# Patient Record
Sex: Female | Born: 1935 | Race: Black or African American | Hispanic: No | Smoking: Former smoker
Health system: Southern US, Community
[De-identification: ages and names within clinical notes are randomized; demographics above are authoritative.]

## PROBLEM LIST (undated history)

## (undated) DIAGNOSIS — E0590 Thyrotoxicosis, unspecified without thyrotoxic crisis or storm: Secondary | ICD-10-CM

## (undated) DIAGNOSIS — F419 Anxiety disorder, unspecified: Secondary | ICD-10-CM

## (undated) DIAGNOSIS — E785 Hyperlipidemia, unspecified: Secondary | ICD-10-CM

## (undated) DIAGNOSIS — M8580 Other specified disorders of bone density and structure, unspecified site: Secondary | ICD-10-CM

## (undated) DIAGNOSIS — IMO0001 Reserved for inherently not codable concepts without codable children: Secondary | ICD-10-CM

## (undated) DIAGNOSIS — R413 Other amnesia: Secondary | ICD-10-CM

## (undated) DIAGNOSIS — I639 Cerebral infarction, unspecified: Secondary | ICD-10-CM

## (undated) DIAGNOSIS — I10 Essential (primary) hypertension: Secondary | ICD-10-CM

## (undated) DIAGNOSIS — I480 Paroxysmal atrial fibrillation: Secondary | ICD-10-CM

## (undated) DIAGNOSIS — L910 Hypertrophic scar: Secondary | ICD-10-CM

## (undated) DIAGNOSIS — G459 Transient cerebral ischemic attack, unspecified: Secondary | ICD-10-CM

## (undated) DIAGNOSIS — M1990 Unspecified osteoarthritis, unspecified site: Secondary | ICD-10-CM

## (undated) DIAGNOSIS — R42 Dizziness and giddiness: Secondary | ICD-10-CM

## (undated) DIAGNOSIS — K219 Gastro-esophageal reflux disease without esophagitis: Secondary | ICD-10-CM

## (undated) DIAGNOSIS — L853 Xerosis cutis: Secondary | ICD-10-CM

## (undated) DIAGNOSIS — N390 Urinary tract infection, site not specified: Secondary | ICD-10-CM

## (undated) DIAGNOSIS — I2510 Atherosclerotic heart disease of native coronary artery without angina pectoris: Secondary | ICD-10-CM

## (undated) HISTORY — DX: Other specified disorders of bone density and structure, unspecified site: M85.80

## (undated) HISTORY — DX: Other amnesia: R41.3

## (undated) HISTORY — DX: Essential (primary) hypertension: I10

## (undated) HISTORY — DX: Urinary tract infection, site not specified: N39.0

## (undated) HISTORY — DX: Dizziness and giddiness: R42

## (undated) HISTORY — DX: Xerosis cutis: L85.3

## (undated) HISTORY — DX: Hypertrophic scar: L91.0

## (undated) HISTORY — DX: Paroxysmal atrial fibrillation: I48.0

## (undated) HISTORY — DX: Thyrotoxicosis, unspecified without thyrotoxic crisis or storm: E05.90

## (undated) HISTORY — PX: HEMORRHOID SURGERY: SHX153

## (undated) HISTORY — PX: JOINT REPLACEMENT: SHX530

## (undated) HISTORY — DX: Anxiety disorder, unspecified: F41.9

## (undated) HISTORY — DX: Hyperlipidemia, unspecified: E78.5

## (undated) HISTORY — DX: Cerebral infarction, unspecified: I63.9

## (undated) HISTORY — DX: Unspecified osteoarthritis, unspecified site: M19.90

## (undated) HISTORY — PX: OOPHORECTOMY: SHX86

## (undated) HISTORY — DX: Atherosclerotic heart disease of native coronary artery without angina pectoris: I25.10

## (undated) HISTORY — PX: CARDIAC VALVE REPLACEMENT: SHX585

## (undated) HISTORY — PX: ABDOMINAL HYSTERECTOMY: SHX81

---

## 1996-06-10 HISTORY — PX: CORONARY ARTERY BYPASS GRAFT: SHX141

## 1996-06-10 HISTORY — PX: AORTIC VALVE REPLACEMENT: SHX41

## 1997-06-10 HISTORY — PX: TOTAL KNEE ARTHROPLASTY: SHX125

## 1998-06-05 ENCOUNTER — Encounter: Payer: Self-pay | Admitting: Specialist

## 1998-06-07 ENCOUNTER — Inpatient Hospital Stay (HOSPITAL_COMMUNITY): Admission: RE | Admit: 1998-06-07 | Discharge: 1998-06-09 | Payer: Self-pay | Admitting: Specialist

## 1998-06-09 ENCOUNTER — Inpatient Hospital Stay (HOSPITAL_COMMUNITY)
Admission: RE | Admit: 1998-06-09 | Discharge: 1998-06-16 | Payer: Self-pay | Admitting: Physical Medicine and Rehabilitation

## 1999-05-02 ENCOUNTER — Ambulatory Visit (HOSPITAL_COMMUNITY): Admission: RE | Admit: 1999-05-02 | Discharge: 1999-05-02 | Payer: Self-pay | Admitting: Gastroenterology

## 1999-07-30 ENCOUNTER — Encounter: Admission: RE | Admit: 1999-07-30 | Discharge: 1999-07-30 | Payer: Self-pay | Admitting: Family Medicine

## 1999-07-30 ENCOUNTER — Encounter: Payer: Self-pay | Admitting: Family Medicine

## 2000-08-06 ENCOUNTER — Encounter: Admission: RE | Admit: 2000-08-06 | Discharge: 2000-08-06 | Payer: Self-pay | Admitting: Family Medicine

## 2000-08-06 ENCOUNTER — Encounter: Payer: Self-pay | Admitting: Family Medicine

## 2004-02-14 ENCOUNTER — Encounter: Admission: RE | Admit: 2004-02-14 | Discharge: 2004-02-14 | Payer: Self-pay | Admitting: Family Medicine

## 2004-02-28 ENCOUNTER — Encounter: Admission: RE | Admit: 2004-02-28 | Discharge: 2004-02-28 | Payer: Self-pay | Admitting: Family Medicine

## 2004-03-22 ENCOUNTER — Encounter: Admission: RE | Admit: 2004-03-22 | Discharge: 2004-03-22 | Payer: Self-pay | Admitting: Family Medicine

## 2004-04-17 ENCOUNTER — Encounter: Admission: RE | Admit: 2004-04-17 | Discharge: 2004-04-17 | Payer: Self-pay | Admitting: Family Medicine

## 2004-11-10 ENCOUNTER — Emergency Department (HOSPITAL_COMMUNITY): Admission: EM | Admit: 2004-11-10 | Discharge: 2004-11-10 | Payer: Self-pay | Admitting: Emergency Medicine

## 2004-11-13 ENCOUNTER — Encounter: Admission: RE | Admit: 2004-11-13 | Discharge: 2004-11-13 | Payer: Self-pay | Admitting: Family Medicine

## 2005-06-10 LAB — HM COLONOSCOPY

## 2005-10-03 ENCOUNTER — Encounter: Admission: RE | Admit: 2005-10-03 | Discharge: 2005-10-03 | Payer: Self-pay | Admitting: Family Medicine

## 2005-10-05 ENCOUNTER — Encounter: Admission: RE | Admit: 2005-10-05 | Discharge: 2005-10-05 | Payer: Self-pay | Admitting: Family Medicine

## 2006-07-25 ENCOUNTER — Ambulatory Visit: Payer: Self-pay | Admitting: Internal Medicine

## 2006-08-15 ENCOUNTER — Ambulatory Visit: Payer: Self-pay | Admitting: Family Medicine

## 2006-08-19 ENCOUNTER — Ambulatory Visit: Payer: Self-pay | Admitting: Family Medicine

## 2006-08-19 LAB — CONVERTED CEMR LAB
BUN: 19 mg/dL (ref 6–23)
Basophils Absolute: 0 10*3/uL (ref 0.0–0.1)
Basophils Relative: 1 % (ref 0.0–1.0)
CO2: 30 meq/L (ref 19–32)
Calcium: 9.1 mg/dL (ref 8.4–10.5)
Chloride: 102 meq/L (ref 96–112)
Creatinine, Ser: 1.4 mg/dL — ABNORMAL HIGH (ref 0.4–1.2)
Free T4: 0.5 ng/dL — ABNORMAL LOW (ref 0.6–1.6)
HCT: 37.1 % (ref 36.0–46.0)
Lymphocytes Relative: 21.4 % (ref 12.0–46.0)
MCV: 96.1 fL (ref 78.0–100.0)
Monocytes Relative: 18.3 % — ABNORMAL HIGH (ref 3.0–11.0)
Platelets: 204 10*3/uL (ref 150–400)
RBC: 3.86 M/uL — ABNORMAL LOW (ref 3.87–5.11)
RDW: 12.1 % (ref 11.5–14.6)
T3, Free: 3 pg/mL (ref 2.3–4.2)
TSH: 0.58 microintl units/mL (ref 0.35–5.50)
WBC: 4.6 10*3/uL (ref 4.5–10.5)

## 2006-08-20 ENCOUNTER — Ambulatory Visit: Payer: Self-pay | Admitting: Internal Medicine

## 2006-09-03 ENCOUNTER — Encounter: Payer: Self-pay | Admitting: Internal Medicine

## 2006-09-03 ENCOUNTER — Ambulatory Visit: Payer: Self-pay | Admitting: Internal Medicine

## 2006-09-03 DIAGNOSIS — E0590 Thyrotoxicosis, unspecified without thyrotoxic crisis or storm: Secondary | ICD-10-CM

## 2006-09-03 DIAGNOSIS — I10 Essential (primary) hypertension: Secondary | ICD-10-CM

## 2006-09-03 DIAGNOSIS — Z951 Presence of aortocoronary bypass graft: Secondary | ICD-10-CM | POA: Insufficient documentation

## 2006-09-03 DIAGNOSIS — J45909 Unspecified asthma, uncomplicated: Secondary | ICD-10-CM

## 2006-09-03 DIAGNOSIS — E785 Hyperlipidemia, unspecified: Secondary | ICD-10-CM

## 2006-09-03 DIAGNOSIS — I25810 Atherosclerosis of coronary artery bypass graft(s) without angina pectoris: Secondary | ICD-10-CM

## 2006-10-15 ENCOUNTER — Encounter: Payer: Self-pay | Admitting: Internal Medicine

## 2006-10-15 ENCOUNTER — Ambulatory Visit: Payer: Self-pay | Admitting: Internal Medicine

## 2006-10-15 DIAGNOSIS — Z8719 Personal history of other diseases of the digestive system: Secondary | ICD-10-CM

## 2006-10-15 DIAGNOSIS — Z952 Presence of prosthetic heart valve: Secondary | ICD-10-CM

## 2006-10-15 LAB — CONVERTED CEMR LAB
BUN: 33 mg/dL — ABNORMAL HIGH (ref 6–23)
CO2: 30 meq/L (ref 19–32)
Calcium: 9.3 mg/dL (ref 8.4–10.5)
GFR calc Af Amer: 40 mL/min
GFR calc non Af Amer: 33 mL/min
Potassium: 4.1 meq/L (ref 3.5–5.1)

## 2006-10-29 ENCOUNTER — Encounter: Payer: Self-pay | Admitting: Internal Medicine

## 2006-11-05 ENCOUNTER — Encounter: Payer: Self-pay | Admitting: Internal Medicine

## 2006-11-14 ENCOUNTER — Encounter (INDEPENDENT_AMBULATORY_CARE_PROVIDER_SITE_OTHER): Payer: Self-pay | Admitting: *Deleted

## 2006-11-26 ENCOUNTER — Ambulatory Visit: Payer: Self-pay | Admitting: Internal Medicine

## 2006-11-26 DIAGNOSIS — E1159 Type 2 diabetes mellitus with other circulatory complications: Secondary | ICD-10-CM

## 2006-11-28 ENCOUNTER — Ambulatory Visit: Payer: Self-pay | Admitting: Internal Medicine

## 2006-12-03 LAB — CONVERTED CEMR LAB
Calcium: 9.5 mg/dL (ref 8.4–10.5)
Chloride: 98 meq/L (ref 96–112)
Creatinine,U: 181.7 mg/dL
GFR calc Af Amer: 61 mL/min
GFR calc non Af Amer: 51 mL/min
Glucose, Bld: 88 mg/dL (ref 70–99)
Hgb A1c MFr Bld: 6.5 % — ABNORMAL HIGH (ref 4.6–6.0)
Sodium: 132 meq/L — ABNORMAL LOW (ref 135–145)
TSH: 1.6 microintl units/mL (ref 0.35–5.50)

## 2006-12-05 ENCOUNTER — Encounter: Payer: Self-pay | Admitting: Internal Medicine

## 2007-01-01 ENCOUNTER — Encounter: Payer: Self-pay | Admitting: Internal Medicine

## 2007-01-02 ENCOUNTER — Encounter: Payer: Self-pay | Admitting: Internal Medicine

## 2007-02-26 ENCOUNTER — Ambulatory Visit: Payer: Self-pay | Admitting: Internal Medicine

## 2007-02-27 ENCOUNTER — Telehealth: Payer: Self-pay | Admitting: Internal Medicine

## 2007-03-04 ENCOUNTER — Ambulatory Visit: Payer: Self-pay | Admitting: Internal Medicine

## 2007-03-05 ENCOUNTER — Encounter: Payer: Self-pay | Admitting: Internal Medicine

## 2007-03-05 DIAGNOSIS — I4891 Unspecified atrial fibrillation: Secondary | ICD-10-CM | POA: Insufficient documentation

## 2007-03-05 LAB — CONVERTED CEMR LAB
Cholesterol: 122 mg/dL (ref 0–200)
LDL Cholesterol: 66 mg/dL (ref 0–99)
Total CHOL/HDL Ratio: 2.7
Triglycerides: 51 mg/dL (ref 0–149)

## 2007-03-09 ENCOUNTER — Encounter: Payer: Self-pay | Admitting: Internal Medicine

## 2007-04-06 ENCOUNTER — Telehealth (INDEPENDENT_AMBULATORY_CARE_PROVIDER_SITE_OTHER): Payer: Self-pay | Admitting: *Deleted

## 2007-04-14 ENCOUNTER — Ambulatory Visit: Payer: Self-pay | Admitting: Internal Medicine

## 2007-04-29 ENCOUNTER — Ambulatory Visit: Payer: Self-pay | Admitting: Cardiology

## 2007-04-29 ENCOUNTER — Ambulatory Visit: Payer: Self-pay | Admitting: Internal Medicine

## 2007-04-29 LAB — CONVERTED CEMR LAB
Hemoglobin: 14.7 g/dL
INR: 2.3

## 2007-04-30 ENCOUNTER — Ambulatory Visit: Payer: Self-pay | Admitting: Internal Medicine

## 2007-04-30 LAB — CONVERTED CEMR LAB

## 2007-05-01 ENCOUNTER — Encounter: Payer: Self-pay | Admitting: Internal Medicine

## 2007-05-04 ENCOUNTER — Telehealth: Payer: Self-pay | Admitting: Internal Medicine

## 2007-06-16 ENCOUNTER — Ambulatory Visit: Payer: Self-pay | Admitting: Internal Medicine

## 2007-06-16 LAB — CONVERTED CEMR LAB
Nitrite: NEGATIVE
Specific Gravity, Urine: 1.01
Urobilinogen, UA: NEGATIVE
WBC Urine, dipstick: NEGATIVE

## 2007-06-17 ENCOUNTER — Encounter: Payer: Self-pay | Admitting: Internal Medicine

## 2007-06-18 ENCOUNTER — Telehealth (INDEPENDENT_AMBULATORY_CARE_PROVIDER_SITE_OTHER): Payer: Self-pay | Admitting: *Deleted

## 2007-06-22 LAB — CONVERTED CEMR LAB
ALT: 14 units/L (ref 0–35)
AST: 22 units/L (ref 0–37)
BUN: 25 mg/dL — ABNORMAL HIGH (ref 6–23)
CO2: 29 meq/L (ref 19–32)
Calcium: 9.1 mg/dL (ref 8.4–10.5)
Chloride: 104 meq/L (ref 96–112)
Creatinine, Ser: 1 mg/dL (ref 0.4–1.2)
GFR calc Af Amer: 68 mL/min
Total CHOL/HDL Ratio: 3.3

## 2007-07-06 ENCOUNTER — Telehealth (INDEPENDENT_AMBULATORY_CARE_PROVIDER_SITE_OTHER): Payer: Self-pay | Admitting: *Deleted

## 2007-07-09 ENCOUNTER — Telehealth (INDEPENDENT_AMBULATORY_CARE_PROVIDER_SITE_OTHER): Payer: Self-pay | Admitting: *Deleted

## 2007-07-13 ENCOUNTER — Telehealth (INDEPENDENT_AMBULATORY_CARE_PROVIDER_SITE_OTHER): Payer: Self-pay | Admitting: *Deleted

## 2007-07-13 ENCOUNTER — Ambulatory Visit: Payer: Self-pay | Admitting: Internal Medicine

## 2007-07-13 LAB — CONVERTED CEMR LAB
Ketones, urine, test strip: NEGATIVE
Nitrite: NEGATIVE
Specific Gravity, Urine: <1.005

## 2007-07-14 ENCOUNTER — Encounter: Payer: Self-pay | Admitting: Internal Medicine

## 2007-07-14 LAB — CONVERTED CEMR LAB
Bilirubin Urine: NEGATIVE
Ketones, ur: NEGATIVE mg/dL
Protein, ur: NEGATIVE mg/dL
Urobilinogen, UA: 0.2 (ref 0.0–1.0)

## 2007-07-16 ENCOUNTER — Telehealth (INDEPENDENT_AMBULATORY_CARE_PROVIDER_SITE_OTHER): Payer: Self-pay | Admitting: *Deleted

## 2007-07-20 ENCOUNTER — Ambulatory Visit: Payer: Self-pay | Admitting: Internal Medicine

## 2007-07-20 DIAGNOSIS — N390 Urinary tract infection, site not specified: Secondary | ICD-10-CM | POA: Insufficient documentation

## 2007-07-21 ENCOUNTER — Ambulatory Visit: Payer: Self-pay | Admitting: Cardiovascular Disease

## 2007-07-22 ENCOUNTER — Encounter: Payer: Self-pay | Admitting: Internal Medicine

## 2007-09-15 ENCOUNTER — Ambulatory Visit: Payer: Self-pay | Admitting: Internal Medicine

## 2007-09-24 LAB — CONVERTED CEMR LAB
Basophils Relative: 0.5 % (ref 0.0–1.0)
Eosinophils Absolute: 0.1 10*3/uL (ref 0.0–0.7)
Eosinophils Relative: 2.5 % (ref 0.0–5.0)
HCT: 38.6 % (ref 36.0–46.0)
Hemoglobin: 12.6 g/dL (ref 12.0–15.0)
MCV: 97.3 fL (ref 78.0–100.0)
Monocytes Absolute: 0.5 10*3/uL (ref 0.1–1.0)
RBC: 3.97 M/uL (ref 3.87–5.11)
WBC: 4.5 10*3/uL (ref 4.5–10.5)

## 2007-10-16 ENCOUNTER — Encounter: Payer: Self-pay | Admitting: Internal Medicine

## 2007-10-16 ENCOUNTER — Telehealth (INDEPENDENT_AMBULATORY_CARE_PROVIDER_SITE_OTHER): Payer: Self-pay | Admitting: *Deleted

## 2007-11-03 ENCOUNTER — Encounter: Payer: Self-pay | Admitting: Internal Medicine

## 2007-11-10 ENCOUNTER — Encounter: Payer: Self-pay | Admitting: Internal Medicine

## 2007-11-16 ENCOUNTER — Telehealth (INDEPENDENT_AMBULATORY_CARE_PROVIDER_SITE_OTHER): Payer: Self-pay | Admitting: *Deleted

## 2007-11-20 ENCOUNTER — Telehealth (INDEPENDENT_AMBULATORY_CARE_PROVIDER_SITE_OTHER): Payer: Self-pay | Admitting: *Deleted

## 2007-11-24 ENCOUNTER — Ambulatory Visit: Payer: Self-pay | Admitting: Internal Medicine

## 2007-12-29 ENCOUNTER — Ambulatory Visit: Payer: Self-pay | Admitting: Internal Medicine

## 2007-12-31 ENCOUNTER — Encounter: Payer: Self-pay | Admitting: Internal Medicine

## 2008-01-04 LAB — CONVERTED CEMR LAB
ALT: 15 units/L (ref 0–35)
Alkaline Phosphatase: 66 units/L (ref 39–117)
Bilirubin, Direct: 0.1 mg/dL (ref 0.0–0.3)
CO2: 29 meq/L (ref 19–32)
GFR calc Af Amer: 61 mL/min
Glucose, Bld: 95 mg/dL (ref 70–99)
Microalb Creat Ratio: 3.8 mg/g (ref 0.0–30.0)
Potassium: 3.5 meq/L (ref 3.5–5.1)
Sodium: 137 meq/L (ref 135–145)
Total Bilirubin: 0.6 mg/dL (ref 0.3–1.2)
Total Protein: 6.5 g/dL (ref 6.0–8.3)

## 2008-01-05 ENCOUNTER — Telehealth (INDEPENDENT_AMBULATORY_CARE_PROVIDER_SITE_OTHER): Payer: Self-pay | Admitting: *Deleted

## 2008-02-09 ENCOUNTER — Telehealth (INDEPENDENT_AMBULATORY_CARE_PROVIDER_SITE_OTHER): Payer: Self-pay | Admitting: *Deleted

## 2008-04-19 ENCOUNTER — Telehealth (INDEPENDENT_AMBULATORY_CARE_PROVIDER_SITE_OTHER): Payer: Self-pay | Admitting: *Deleted

## 2008-04-27 ENCOUNTER — Ambulatory Visit: Payer: Self-pay | Admitting: Internal Medicine

## 2008-04-29 ENCOUNTER — Ambulatory Visit: Payer: Self-pay | Admitting: Internal Medicine

## 2008-05-03 ENCOUNTER — Telehealth (INDEPENDENT_AMBULATORY_CARE_PROVIDER_SITE_OTHER): Payer: Self-pay | Admitting: *Deleted

## 2008-05-03 LAB — CONVERTED CEMR LAB
BUN: 25 mg/dL — ABNORMAL HIGH (ref 6–23)
CO2: 30 meq/L (ref 19–32)
Calcium: 9 mg/dL (ref 8.4–10.5)
Cholesterol: 153 mg/dL (ref 0–200)
Creatinine, Ser: 1 mg/dL (ref 0.4–1.2)
HDL: 54.4 mg/dL (ref >39.0)
Hgb A1c MFr Bld: 6.1 % — ABNORMAL HIGH (ref 4.6–6.0)
Total CHOL/HDL Ratio: 2.8
Triglycerides: 67 mg/dL (ref 0–149)

## 2008-05-18 ENCOUNTER — Encounter: Payer: Self-pay | Admitting: Internal Medicine

## 2008-05-31 ENCOUNTER — Telehealth (INDEPENDENT_AMBULATORY_CARE_PROVIDER_SITE_OTHER): Payer: Self-pay | Admitting: *Deleted

## 2008-06-06 ENCOUNTER — Telehealth (INDEPENDENT_AMBULATORY_CARE_PROVIDER_SITE_OTHER): Payer: Self-pay | Admitting: *Deleted

## 2008-06-08 ENCOUNTER — Ambulatory Visit: Payer: Self-pay | Admitting: Internal Medicine

## 2008-06-08 DIAGNOSIS — M1990 Unspecified osteoarthritis, unspecified site: Secondary | ICD-10-CM | POA: Insufficient documentation

## 2008-09-30 ENCOUNTER — Encounter (INDEPENDENT_AMBULATORY_CARE_PROVIDER_SITE_OTHER): Payer: Self-pay | Admitting: *Deleted

## 2008-10-31 ENCOUNTER — Ambulatory Visit: Payer: Self-pay | Admitting: Internal Medicine

## 2008-10-31 DIAGNOSIS — M899 Disorder of bone, unspecified: Secondary | ICD-10-CM | POA: Insufficient documentation

## 2008-10-31 DIAGNOSIS — M949 Disorder of cartilage, unspecified: Secondary | ICD-10-CM

## 2008-11-03 ENCOUNTER — Ambulatory Visit: Payer: Self-pay | Admitting: Internal Medicine

## 2008-11-10 LAB — CONVERTED CEMR LAB: Hgb A1c MFr Bld: 6.4 % (ref 4.6–6.5)

## 2008-11-13 ENCOUNTER — Ambulatory Visit: Payer: Self-pay | Admitting: Diagnostic Radiology

## 2008-11-13 ENCOUNTER — Emergency Department (HOSPITAL_BASED_OUTPATIENT_CLINIC_OR_DEPARTMENT_OTHER): Admission: EM | Admit: 2008-11-13 | Discharge: 2008-11-13 | Payer: Self-pay | Admitting: Emergency Medicine

## 2008-11-16 ENCOUNTER — Ambulatory Visit: Payer: Self-pay | Admitting: Internal Medicine

## 2008-12-09 ENCOUNTER — Encounter: Payer: Self-pay | Admitting: Internal Medicine

## 2008-12-26 ENCOUNTER — Encounter: Payer: Self-pay | Admitting: Internal Medicine

## 2009-02-16 ENCOUNTER — Telehealth: Payer: Self-pay | Admitting: Internal Medicine

## 2009-03-06 ENCOUNTER — Ambulatory Visit: Payer: Self-pay | Admitting: Internal Medicine

## 2009-03-08 ENCOUNTER — Telehealth (INDEPENDENT_AMBULATORY_CARE_PROVIDER_SITE_OTHER): Payer: Self-pay | Admitting: *Deleted

## 2009-03-08 LAB — CONVERTED CEMR LAB
ALT: 18 units/L (ref 0–35)
AST: 26 units/L (ref 0–37)
BUN: 16 mg/dL (ref 6–23)
Basophils Absolute: 0.1 10*3/uL (ref 0.0–0.1)
Basophils Relative: 0.9 % (ref 0.0–3.0)
CO2: 30 meq/L (ref 19–32)
Calcium: 9 mg/dL (ref 8.4–10.5)
Chloride: 102 meq/L (ref 96–112)
Creatinine, Ser: 0.9 mg/dL (ref 0.4–1.2)
Creatinine,U: 49.3 mg/dL
Eosinophils Absolute: 0.2 10*3/uL (ref 0.0–0.7)
Eosinophils Relative: 3 % (ref 0.0–5.0)
Free T4: 0.6 ng/dL (ref 0.6–1.6)
GFR calc non Af Amer: 76.69 mL/min (ref >60)
Glucose, Bld: 88 mg/dL (ref 70–99)
HCT: 38.6 % (ref 36.0–46.0)
Hemoglobin: 13 g/dL (ref 12.0–15.0)
Hgb A1c MFr Bld: 6.4 % (ref 4.6–6.5)
Lymphocytes Relative: 24.2 % (ref 12.0–46.0)
Lymphs Abs: 1.5 10*3/uL (ref 0.7–4.0)
MCHC: 33.8 g/dL (ref 30.0–36.0)
MCV: 97.1 fL (ref 78.0–100.0)
Microalb Creat Ratio: 2 mg/g (ref 0.0–30.0)
Microalb, Ur: 0.1 mg/dL (ref 0.0–1.9)
Monocytes Absolute: 0.7 10*3/uL (ref 0.1–1.0)
Monocytes Relative: 11.1 % (ref 3.0–12.0)
Neutro Abs: 3.6 10*3/uL (ref 1.4–7.7)
Neutrophils Relative %: 60.8 % (ref 43.0–77.0)
Platelets: 172 10*3/uL (ref 150.0–400.0)
Potassium: 3.9 meq/L (ref 3.5–5.1)
RBC: 3.98 M/uL (ref 3.87–5.11)
RDW: 13.1 % (ref 11.5–14.6)
Sodium: 138 meq/L (ref 135–145)
T3, Free: 2.3 pg/mL (ref 2.3–4.2)
TSH: 2.29 microintl units/mL (ref 0.35–5.50)
WBC: 6.1 10*3/uL (ref 4.5–10.5)

## 2009-04-19 ENCOUNTER — Ambulatory Visit: Payer: Self-pay | Admitting: Internal Medicine

## 2009-04-25 LAB — CONVERTED CEMR LAB: TSH: 0.07 microintl units/mL — ABNORMAL LOW (ref 0.35–5.50)

## 2009-05-03 ENCOUNTER — Telehealth: Payer: Self-pay | Admitting: Internal Medicine

## 2009-06-23 ENCOUNTER — Encounter: Payer: Self-pay | Admitting: Internal Medicine

## 2009-07-11 ENCOUNTER — Ambulatory Visit: Payer: Self-pay | Admitting: Internal Medicine

## 2009-07-11 LAB — CONVERTED CEMR LAB
HDL goal, serum: 40 mg/dL
Vit D, 25-Hydroxy: 28 ng/mL — ABNORMAL LOW (ref 30–89)

## 2009-07-18 ENCOUNTER — Telehealth (INDEPENDENT_AMBULATORY_CARE_PROVIDER_SITE_OTHER): Payer: Self-pay | Admitting: *Deleted

## 2009-07-18 LAB — CONVERTED CEMR LAB
BUN: 15 mg/dL (ref 6–23)
CO2: 29 meq/L (ref 19–32)
Chloride: 99 meq/L (ref 96–112)
Cholesterol: 119 mg/dL (ref 0–200)
Creatinine, Ser: 1.1 mg/dL (ref 0.4–1.2)
HDL: 50.2 mg/dL (ref >39.00)
Hgb A1c MFr Bld: 6.5 % (ref 4.6–6.5)
LDL Cholesterol: 57 mg/dL (ref 0–99)
Potassium: 3.5 meq/L (ref 3.5–5.1)
VLDL: 11.6 mg/dL (ref 0.0–40.0)

## 2009-08-29 ENCOUNTER — Ambulatory Visit: Payer: Self-pay | Admitting: Internal Medicine

## 2009-08-29 DIAGNOSIS — R1013 Epigastric pain: Secondary | ICD-10-CM

## 2009-08-29 DIAGNOSIS — K3189 Other diseases of stomach and duodenum: Secondary | ICD-10-CM

## 2009-08-31 ENCOUNTER — Telehealth (INDEPENDENT_AMBULATORY_CARE_PROVIDER_SITE_OTHER): Payer: Self-pay | Admitting: *Deleted

## 2009-11-30 ENCOUNTER — Encounter: Payer: Self-pay | Admitting: Internal Medicine

## 2009-12-18 ENCOUNTER — Ambulatory Visit: Payer: Self-pay | Admitting: Internal Medicine

## 2009-12-18 LAB — CONVERTED CEMR LAB
Blood in Urine, dipstick: NEGATIVE
Glucose, Urine, Semiquant: NEGATIVE
Nitrite: NEGATIVE
Protein, U semiquant: NEGATIVE
WBC Urine, dipstick: NEGATIVE

## 2009-12-19 ENCOUNTER — Encounter: Payer: Self-pay | Admitting: Internal Medicine

## 2009-12-19 HISTORY — PX: OTHER SURGICAL HISTORY: SHX169

## 2009-12-22 ENCOUNTER — Encounter: Payer: Self-pay | Admitting: Internal Medicine

## 2010-01-11 ENCOUNTER — Encounter: Payer: Self-pay | Admitting: Internal Medicine

## 2010-01-16 ENCOUNTER — Encounter: Payer: Self-pay | Admitting: Internal Medicine

## 2010-01-19 ENCOUNTER — Telehealth: Payer: Self-pay | Admitting: Internal Medicine

## 2010-02-05 ENCOUNTER — Telehealth: Payer: Self-pay | Admitting: Internal Medicine

## 2010-02-07 ENCOUNTER — Encounter: Payer: Self-pay | Admitting: Internal Medicine

## 2010-02-09 ENCOUNTER — Encounter: Payer: Self-pay | Admitting: Internal Medicine

## 2010-04-19 ENCOUNTER — Ambulatory Visit: Payer: Self-pay | Admitting: Internal Medicine

## 2010-04-19 DIAGNOSIS — R269 Unspecified abnormalities of gait and mobility: Secondary | ICD-10-CM

## 2010-04-20 ENCOUNTER — Ambulatory Visit: Payer: Self-pay | Admitting: Internal Medicine

## 2010-04-23 ENCOUNTER — Telehealth: Payer: Self-pay | Admitting: Internal Medicine

## 2010-04-23 LAB — CONVERTED CEMR LAB
BUN: 21 mg/dL (ref 6–23)
Basophils Relative: 0.7 % (ref 0.0–3.0)
CO2: 31 meq/L (ref 19–32)
Calcium: 8.9 mg/dL (ref 8.4–10.5)
Creatinine, Ser: 1 mg/dL (ref 0.4–1.2)
Eosinophils Relative: 5.5 % — ABNORMAL HIGH (ref 0.0–5.0)
Glucose, Bld: 100 mg/dL — ABNORMAL HIGH (ref 70–99)
HCT: 39.4 % (ref 36.0–46.0)
Hemoglobin: 13.3 g/dL (ref 12.0–15.0)
Lymphs Abs: 1.4 10*3/uL (ref 0.7–4.0)
MCV: 98.2 fL (ref 78.0–100.0)
Monocytes Absolute: 0.6 10*3/uL (ref 0.1–1.0)
Monocytes Relative: 13 % — ABNORMAL HIGH (ref 3.0–12.0)
Neutro Abs: 2.6 10*3/uL (ref 1.4–7.7)
RBC: 4.01 M/uL (ref 3.87–5.11)
Sodium: 137 meq/L (ref 135–145)
TSH: 2.34 microintl units/mL (ref 0.35–5.50)
WBC: 5 10*3/uL (ref 4.5–10.5)

## 2010-05-01 ENCOUNTER — Encounter
Admission: RE | Admit: 2010-05-01 | Discharge: 2010-06-07 | Payer: Self-pay | Source: Home / Self Care | Attending: Internal Medicine | Admitting: Internal Medicine

## 2010-05-10 ENCOUNTER — Emergency Department (HOSPITAL_COMMUNITY)
Admission: EM | Admit: 2010-05-10 | Discharge: 2010-05-10 | Payer: Self-pay | Source: Home / Self Care | Admitting: Emergency Medicine

## 2010-05-10 ENCOUNTER — Telehealth: Payer: Self-pay | Admitting: Internal Medicine

## 2010-05-16 ENCOUNTER — Telehealth: Payer: Self-pay | Admitting: Internal Medicine

## 2010-05-16 ENCOUNTER — Ambulatory Visit: Payer: Self-pay | Admitting: Internal Medicine

## 2010-05-25 ENCOUNTER — Ambulatory Visit: Payer: Self-pay | Admitting: Endocrinology

## 2010-06-05 ENCOUNTER — Encounter
Admission: RE | Admit: 2010-06-05 | Discharge: 2010-06-05 | Payer: Self-pay | Source: Home / Self Care | Attending: Endocrinology | Admitting: Endocrinology

## 2010-06-08 ENCOUNTER — Encounter: Payer: Self-pay | Admitting: Internal Medicine

## 2010-06-10 ENCOUNTER — Encounter
Admission: RE | Admit: 2010-06-10 | Discharge: 2010-06-26 | Payer: Self-pay | Source: Home / Self Care | Attending: Internal Medicine | Admitting: Internal Medicine

## 2010-06-27 ENCOUNTER — Ambulatory Visit
Admission: RE | Admit: 2010-06-27 | Discharge: 2010-06-27 | Payer: Self-pay | Source: Home / Self Care | Attending: Internal Medicine | Admitting: Internal Medicine

## 2010-06-27 ENCOUNTER — Inpatient Hospital Stay (HOSPITAL_COMMUNITY)
Admission: AD | Admit: 2010-06-27 | Discharge: 2010-06-30 | Payer: Self-pay | Source: Home / Self Care | Attending: Internal Medicine | Admitting: Internal Medicine

## 2010-06-27 DIAGNOSIS — R42 Dizziness and giddiness: Secondary | ICD-10-CM | POA: Insufficient documentation

## 2010-06-28 ENCOUNTER — Encounter (INDEPENDENT_AMBULATORY_CARE_PROVIDER_SITE_OTHER): Payer: Self-pay | Admitting: Internal Medicine

## 2010-07-02 LAB — CBC
HCT: 42.6 % (ref 36.0–46.0)
HCT: 40.1 % (ref 36.0–46.0)
HCT: 33.7 % — ABNORMAL LOW (ref 36.0–46.0)
Hemoglobin: 13.3 g/dL (ref 12.0–15.0)
Hemoglobin: 11.2 g/dL — ABNORMAL LOW (ref 12.0–15.0)
MCH: 31.4 pg (ref 26.0–34.0)
MCHC: 33.3 g/dL (ref 30.0–36.0)
MCHC: 33.2 g/dL (ref 30.0–36.0)
MCV: 94.4 fL (ref 78.0–100.0)
Platelets: 209 10*3/uL (ref 150–400)
RBC: 4.5 MIL/uL (ref 3.87–5.11)
RBC: 4.29 MIL/uL (ref 3.87–5.11)
RDW: 12.6 % (ref 11.5–15.5)
WBC: 7 10*3/uL (ref 4.0–10.5)
WBC: 7.1 10*3/uL (ref 4.0–10.5)

## 2010-07-02 LAB — COMPREHENSIVE METABOLIC PANEL
Alkaline Phosphatase: 83 U/L (ref 39–117)
BUN: 41 mg/dL — ABNORMAL HIGH (ref 6–23)
CO2: 27 mEq/L (ref 19–32)
Chloride: 103 mEq/L (ref 96–112)
Creatinine, Ser: 1.25 mg/dL — ABNORMAL HIGH (ref 0.4–1.2)
GFR calc Af Amer: 49 mL/min — ABNORMAL LOW (ref >60)
GFR calc non Af Amer: 41 mL/min — ABNORMAL LOW (ref >60)
Potassium: 4.1 mEq/L (ref 3.5–5.1)
Total Protein: 6.6 g/dL (ref 6.0–8.3)

## 2010-07-02 LAB — T4, FREE: Free T4: 0.9 ng/dL (ref 0.80–1.80)

## 2010-07-02 LAB — IRON AND TIBC
Iron: 90 ug/dL (ref 42–135)
Saturation Ratios: 24 % (ref 20–55)
TIBC: 378 ug/dL (ref 250–470)
UIBC: 288 ug/dL

## 2010-07-02 LAB — VITAMIN B12: Vitamin B-12: 713 pg/mL (ref 211–911)

## 2010-07-02 LAB — TSH
TSH: 0.089 u[IU]/mL — ABNORMAL LOW (ref 0.350–4.500)
TSH: 0.072 u[IU]/mL — ABNORMAL LOW (ref 0.350–4.500)

## 2010-07-02 LAB — BASIC METABOLIC PANEL
BUN: 42 mg/dL — ABNORMAL HIGH (ref 6–23)
BUN: 24 mg/dL — ABNORMAL HIGH (ref 6–23)
CO2: 27 mEq/L (ref 19–32)
CO2: 26 mEq/L (ref 19–32)
Chloride: 101 mEq/L (ref 96–112)
Chloride: 108 mEq/L (ref 96–112)
Glucose, Bld: 109 mg/dL — ABNORMAL HIGH (ref 70–99)
Potassium: 4.2 mEq/L (ref 3.5–5.1)
Potassium: 3.7 mEq/L (ref 3.5–5.1)

## 2010-07-02 LAB — HEPATIC FUNCTION PANEL
AST: 27 U/L (ref 0–37)
Albumin: 3.9 g/dL (ref 3.5–5.2)

## 2010-07-02 LAB — DIFFERENTIAL
Basophils Relative: 1 % (ref 0–1)
Eosinophils Absolute: 0.4 10*3/uL (ref 0.0–0.7)
Eosinophils Relative: 5 % (ref 0–5)
Lymphocytes Relative: 27 % (ref 12–46)
Lymphs Abs: 1.9 10*3/uL (ref 0.7–4.0)
Monocytes Absolute: 0.8 10*3/uL (ref 0.1–1.0)

## 2010-07-02 LAB — FOLATE: Folate: 7.9 ng/mL

## 2010-07-02 LAB — URINE CULTURE: Culture  Setup Time: 201201182131

## 2010-07-02 LAB — LIPID PANEL: VLDL: 14 mg/dL (ref 0–40)

## 2010-07-02 LAB — GLUCOSE, CAPILLARY: Glucose-Capillary: 127 mg/dL — ABNORMAL HIGH (ref 70–99)

## 2010-07-02 LAB — FERRITIN: Ferritin: 67 ng/mL (ref 10–291)

## 2010-07-02 LAB — MAGNESIUM: Magnesium: 2.3 mg/dL (ref 1.5–2.5)

## 2010-07-02 LAB — PROTIME-INR
INR: 2.23 — ABNORMAL HIGH (ref 0.00–1.49)
INR: 2.5 — ABNORMAL HIGH (ref 0.00–1.49)
Prothrombin Time: 24.8 seconds — ABNORMAL HIGH (ref 11.6–15.2)

## 2010-07-03 LAB — CBC
MCH: 30.8 pg (ref 26.0–34.0)
MCV: 94.7 fL (ref 78.0–100.0)
Platelets: 156 10*3/uL (ref 150–400)
RDW: 12.8 % (ref 11.5–15.5)
WBC: 5.2 10*3/uL (ref 4.0–10.5)

## 2010-07-05 NOTE — Discharge Summary (Addendum)
Rebecca Skinner, Rebecca Skinner                   ACCOUNT NO.:  [id number]  MEDICAL RECORD NO.:  [id number]          PATIENT TYPE:  INP  LOCATION:  3728                         FACILITY:  [facility]  PHYSICIAN:  Rebecca Wallick I Piers Baade, MD      DATE OF BIRTH:  1923/01/26  DATE OF ADMISSION:  06/27/2010 DATE OF DISCHARGE:  06/30/2010                              DISCHARGE SUMMARY   PRIMARY CARE PHYSICIAN:  Rebecca Ora, MD  CARDIOLOGIST:  Rebecca Furl. Alanda Amass, MD  DISCHARGE DIAGNOSES: 1. Recurrent peripheral vestibulopathy with recurrent benign     positional vertigo. 2. Paroxysmal atrial fibrillation, on chronic anticoagulation. 3. Coronary artery disease status post coronary artery bypass graft in     1998. 4. Diabetes mellitus. 5. Hypercholesterolemia. 6. Hypertension. 7. Diet-controlled diabetes mellitus. 8. Hyperthyroidism. 9. History of asthma.  DISCHARGE MEDICATIONS: 1. Metoclopramide 10 mg p.o. 3 times daily as needed. 2. Meclizine 25 mg p.o. q.6 h. as needed. 3. Alprazolam 0.5 mg p.o. daily. 4. Norvasc 5 mg p.o. daily. 5. Aspirin 81 mg p.o. daily. 6. Metoprolol 25 mg p.o. t.i.d. 7. Flexeril 10 mg p.o. 3 times daily as needed. 8. Lipitor 40 mg p.o. daily. 9. Nitroglycerin 0.3 mg as needed. 10.Nexium 40 mg p.o. daily. 11.Maxzide half tablet daily.  CONSULTATION:  Neurology consulted.  PROCEDURES: 1. CT head without contrast, minimal white matter change. 2. MRI of neck and brain negative for fracture or mass.  There is no     cord compression, disk degeneration, or spondylosis causing mild     spinal stenosis. 3. Mild atherosclerotics disease at the carotid bifurcation     bilaterally with 40% stenosis. 4. Carotid duplex negative for any ICA stenosis.  HISTORY OF PRESENT ILLNESS:  This is a pleasant African American female with a history of hypertension, diet-controlled diabetes mellitus, hypercholesterolemia, paroxysmal atrial fibrillation on chronic anticoagulation brought to [facility]  secondary to vertigo, mainly when she turned her head to the right.  She has chronic vertigo sensation. She had tried neuro rehab and had vestibular rehab at that time but without any improvement.  She also was on meclizine but did not seem any significant improvement.  She also felt popping sensation on her neck.  On examination, the patient has no neurological deficit.  There is no nystagmus; but the minute she turned her head to the right, those symptoms became more vertigo sensation.  The patient had an MRI of her head and neck to exclude any vestibular insufficiency which did not show any vertebrobasilar insufficiency, carotid duplex were intact.  MRA did not show any dissection and CT head did not show any evidence of acute stroke.  Neurology consulted who had felt supportive measurement with meclizine and adding metoclopramide to her medication.  Also the patient need to resume her outpatient vestibular rehabilitation.  During hospital stay, other medical issues remained stable.  We will hold the patient's Maxzide to be added by the patient's MD.  Iron deficiency anemia.  The patient noticed to have creatinine level of 6-7, but hemoglobin remained stable at 10.5 and hematocrit 32.3.  We will ask Dr. Drue Skinner  to follow up on that and patient may need evaluation by endoscopy/colonoscopy for further evaluation.  We will give the patient iron supplement.  The patient's TSH was 0.072.  The patient need to follow up with Dr. Drue Skinner.  The patient's T4 is 9.09, this is subclinical hyperthyroidism.  , the patient had soft tissue ultrasound of her neck which did show several nodules with calcification in the upper pole of the left lobe for FNA if not previously evaluated and this should be followed by her MD and also followed up by Dr. Drue Skinner. Currently, we felt that the patient is stable for discharge.  Need to follow up [facility] on Tuesday, January 24 for checking her PT/ INR.  Today, INR  is 2.08.  The patient will be discharged with half dose of Coumadin.  Also, the patient needs to follow up with Dr. Drue Skinner as an outpatient for vestibular rehabilitation.  Currently, we felt the patient is medically stable for discharge.     Rebecca Alcorn Bosie Helper, MD     HIE/MEDQ  D:  06/30/2010  T:  07/01/2010  Job:  [id number]  Electronically Signed by Rebecca Cargo MD on 07/05/2010 01:12:01 PM

## 2010-07-10 NOTE — Medication Information (Signed)
Summary: [facility] with [facility]ine[facility]  [facility] with [facility]   Imported By: Lanelle Bal 01/23/2010 09:35:52  _____________________________________________________________________  External Attachment:    Type:   Image     Comment:   External Document

## 2010-07-10 NOTE — Progress Notes (Signed)
Summary: call to cardiology  Phone Note Outgoing Call   Summary of Call: please call cardiology, let them know  her INR was  1.78 on 05-10-10 when she was seen at the ER. Jose E. Paz MD  May 16, 2010 6:13 PM   Follow-up for Phone Call        [facility] w/ Cardiology yesterday and they saw her on  Monday of this week and did her INR--they are faxing over those results. They did say she was in range on Monday.  Follow-up by: Army Fossa CMA,  May 17, 2010 8:15 AM

## 2010-07-10 NOTE — Medication Information (Signed)
Summary: [facility] with Amlodipine & Simvastatin[facility]  [facility]action with [facility]ipine [facility]atin[facility]   Imported By: Lanelle Bal 02/19/2010 13:49:41  _____________________________________________________________________  External Attachment:    Type:   Image     Comment:   External Document

## 2010-07-10 NOTE — Progress Notes (Signed)
Summary: QUESTION  Phone Note Call from Patient Call back at Home Phone [phone]   Caller: Patient Summary of Call: PT WALK IN WANTING TO KNOW WHY SHE IS TAKING THESE TWO MED TOGETHER SIMVASTATIN W/AMLODIPINE. Initial call taken by: Freddy Jaksch,  February 05, 2010 8:58 AM  Follow-up for Phone Call        Do you want to change anything on med list with the Simvastatin 80mg ? Army Fossa CMA  February 05, 2010 9:21 AM   Additional Follow-up for Phone Call Additional follow up Details #1::        advise patient she has been taking those medicines for at least 2 years, no apparent side effects. Studies show that she is at risk of a myopathy. plan: -switch simvastatin 80 mg to  Lipitor 40 mg one p.o. q. day. -come back fasting in 2 months for a visit Additional Follow-up by: Jose E. Paz MD,  February 05, 2010 5:55 PM    Additional Follow-up for Phone Call Additional follow up Details #2::    Spoke with pt she is aware of medication change, will make an appt for 2 months. Army Fossa CMA  February 06, 2010 10:24 AM   New/Updated Medications: LIPITOR 40 MG TABS (ATORVASTATIN CALCIUM) 1 by mouth at bedtime. Prescriptions: LIPITOR 40 MG TABS (ATORVASTATIN CALCIUM) 1 by mouth at bedtime.  #30 x 1   Entered by:   Army Fossa CMA   Authorized by:   Nolon Rod. Paz MD   Signed by:   Army Fossa CMA on 02/06/2010   Method used:   Electronically to        [id number] ([facility]       [address]       [place], [place]  [address]       Ph: [phone] or [phone]       Fax: [phone]   RxID:   [id number]

## 2010-07-10 NOTE — H&P (Signed)
Rebecca, Skinner                   ACCOUNT NO.:  [id number]  MEDICAL RECORD NO.:  [id number]          PATIENT TYPE:  INP  LOCATION:  3728                         FACILITY:  [facility]  PHYSICIAN:  Lonia Blood, M.D.DATE OF BIRTH:  Jan 31, 1923  DATE OF ADMISSION:  06/27/2010 DATE OF DISCHARGE:                             HISTORY & PHYSICAL   PRIMARY CARE PHYSICIAN:  Willow Ora, MD  CARDIOLOGIST:  Pearletha Furl. Alanda Amass, MD  CHIEF COMPLAINT:  Vertigo.  HISTORY OF PRESENT ILLNESS:  [name] is a very pleasant 77 year old female, who lives independently in the [place] area.  She was in her usual state of health until approximately 2 weeks ago.  At that time, she began to experience severe vertigo.  She states that her symptoms only occur when she turns her head to the right.  She describes her symptoms as spinning of the room and closing another room around her with blurry vision.  She has not passed out.  She denies actually fall. She denies having struck her head.  She does however live independently and is not observed 24 hours a day.  She denies fevers or chills.  She denies recent change in medications; although, medical records do suggest that her Tapazole was discontinued in December in attempts to liberate her from this medication.  She has had no diarrhea.  She denies any chest pain, shortness of breath, fevers, chills, nausea, or vomiting.  She states that apart from her vertigo, she is otherwise doing well.  She ambulates without difficulty with exception to some chronic pain in her knees related to osteoporosis/osteoarthritis.  She presented to her primary care physician's office for evaluation today after a trial of meclizine in the outpatient setting.  She was hesitant to be admitted to the hospital, but ultimately Dr. Drue Novel was able to convince her that inpatient evaluation was warranted.  REVIEW OF SYSTEMS:  As comprehensive review of system is  unremarkable with exception of the positives noted in the history of present illness as above.  PAST MEDICAL HISTORY: 1. Hypertension. 2. Diabetes mellitus type 2 - diet controlled. 3. Paroxysmal atrial fibrillation. 4. Gastroesophageal reflux disease. 5. Hyperlipidemia. 6. Hyperthyroidism - prior use of Tapazole, discontinued in December     2011. 7. Coronary artery disease status post coronary artery bypass graft     1998 - details unclear - followed by [facility]     [facility]. 8. "Asthma" - likely chronic obstructive pulmonary disease - prior     heavy tobacco abuse of one and a half packs per day, recently     discontinued. 9. Osteoarthritis/osteoporosis status post total knee replacement in     1999. 10.Status post hysterectomy with bilateral salpingo-oophorectomy.  OUTPATIENT MEDICATIONS: 1. Flexeril. 2. Nexium. 3. Meclizine. 4. Coumadin. 5. Alprazolam. 6. Zegerid. 7. Lipitor. 8. Benazepril. 9. Maxzide. 10.Amlodipine. 11.Metoprolol. 12.Nitroglycerin. 13.Aspirin.  ALLERGIES:  HYDROCODONE and ULTRAM.  FAMILY HISTORY:  Noncontributory in this 77 year old female.  SOCIAL HISTORY:  The patient has been divorced for quite some time.  She lives alone in the [place] area.  She does have a  daughter in the area, who helps attend to her care.  She does not drink alcohol.  She continues to drive independently.  LABORATORY DATA:  Data review - no labs or x-ray studies are available at the present time as the patient has been directly admitted from a private office.  PHYSICAL EXAMINATION:  VITAL SIGNS:  The patient is afebrile with blood pressure of 160/78, heart rate 56, respiratory rate 18, and O2 sats 98% on room air. GENERAL:  Well-developed and well-nourished female, in no acute respiratory distress. HEENT:  Normocephalic and atraumatic.  Pupils equal, round, and minimally reactive to light with light reflex consistent with possible cataracts.   Oral cavity/oral mucosa normal. NECK:  No appreciable JVD and no appreciable murmurs on auscultation. LUNGS:  Clear to auscultation bilaterally without wheezes or rhonchi. CARDIOVASCULAR:  Regular rate and rhythm without murmur, gallop, or rub with normal S1 and S2 at the present time. ABDOMEN:  Mildly overweight.  Soft bowel sounds present.  No organomegaly.  No rebound.  No ascites. EXTREMITIES:  No significant cyanosis, clubbing, or edema in bilateral lower extremities. NEUROLOGIC:  The patient is alert and oriented x4.  She displays 5/5 strength in bilateral upper and lower extremities.  She has intact sensation to touch throughout.  Cranial nerves II through XII are intact bilaterally - exam is nonfocal with no appreciable nystagmus.  IMPRESSION AND PLAN: 1. Vertigo - this patient's symptoms are concerning because that is     specifically brought on only when she turns her head to the right.     This is worrisome for vertebrobasilar insufficiency or also     possibly severe carotid stenosis.  There is certainly a compliment     of other issues that could be a play here as well.  In particular,     I am suspicious of her thyroid status.  We will begin our     evaluation with carotid Dopplers.  This should provide [place] with     evaluation of her vertebrobasilar flow as well.  A CT scan of the     head will also be carried out in this Coumadin patient to ensure     that she does not have a slowly accumulating subdural hematoma.     TSH and free T4 will be assessed as well.  A full electrolyte panel     will be assessed along with B12 and folic acid levels.  If an exact     etiology is not able to be identified, we will consider MRI of the     brain to rule out lacunar type strokes.  As a general precaution,     we will proceed with an R.N. bedside stroke swallow study with;     though my suspicion for an acute cerebrovascular accident is quite     low. 2. Diabetes mellitus type 2 -  this appears to be diet controlled.  We     will not start the patient on sliding scale insulin at the present     time.  We will check her hemoglobin A1c and check serum glucose     with her blood work.  She will continue on aspirin and ACE     inhibitor for now. 3. Paroxysmal atrial fibrillation - the patient will be monitored on     telemetry.  She has no symptoms that suggest acute coronary     syndrome.  We will check an EKG.  We will continue Coumadin  as long     as her CT head does not reveal any evidence of bleeding. 4. Coronary artery disease status post coronary artery bypass graft -     the patient has no symptoms of acute coronary syndrome whatsoever.     One could consider that the patient could have occult ischemic     cardiomyopathy with depressed ejection fraction and one could also     consider the possibility of severe aortic stenosis to the point     that a murmur is no longer appreciable.  The patient certainly does     not have other symptoms suggestive of either of these issues.  I do     however, feel that echo is reasonable and we will accomplish this     during her hospital stay.  We will ask that her Cardiology [facility],     [facility], interpret her echo. 5. Chronic Coumadin therapy - the patient is appropriately on Coumadin     for her paroxysmal atrial fibrillation.  We will need to evaluate     the patient's gait for stability during her hospital stay.     Ultimately, depending upon on the results of our workup, we would     need to reconsider whether Coumadin is appropriate at the time of     her discharge as chronic vertigo would certainly put her at     significant increased risk of fall and intracranial hemorrhage.     Lonia Blood, M.D.     JTM/MEDQ  D:  06/27/2010  T:  06/27/2010  Job:  [id number]  cc:   Gerlene Burdock A. Alanda Amass, M.D. Willow Ora, MD  Electronically Signed by Jetty Duhamel M.D. on 07/10/2010 09:50:06 AM

## 2010-07-10 NOTE — Letter (Signed)
Summary: [facility]  [facility]   Imported By: Lanelle Bal 12/13/2009 14:51:47  _____________________________________________________________________  External Attachment:    Type:   Image     Comment:   External Document

## 2010-07-10 NOTE — Progress Notes (Signed)
Summary: reaction to med//Dr Drue Novel see  Phone Note Call from Patient   Details for Reason: PATIENT LEFT MESSAGE ON VM: -c/o of pain medication making her itch -"sick on stomach" -can she take "itch medicine along with pain medicine" Shary Decamp  August 31, 2009 9:39 AM  Spoke with pt who c/o reaction to Tramadol -itching  real bad and upset stomach; says med was helping the hip pain, but cannot tolerate.  Recommend pt to D/C med; until further recommendations. -Pt has med Hydroxyzine she will take for the itching.   Follow-up for Phone Call        agree with discontinue Ultram Tylenol 500 mg one or two tablets every 6 hours as needed also advised that the x-ray confirmed osteoarthritis Follow-up by: Jose E. Paz MD,  August 31, 2009 1:06 PM  Additional Follow-up for Phone Call Additional follow up Details #1::        Pt informed of x-ray and  Dr Drue Novel recommendations .Kandice Hams  August 31, 2009 1:17 PM  Additional Follow-up by: Kandice Hams,  August 31, 2009 1:17 PM

## 2010-07-10 NOTE — Assessment & Plan Note (Signed)
Summary: stomach issues, knot of right hip//lch   Vital Signs:  Patient profile:   77 year old female Height:      61.75 inches Weight:      179.4 pounds Pulse rate:   66 / minute BP sitting:   124 / 66  Vitals Entered By: R.Peeler Comments rt hip swollen, lots of pain-8/10 constant, worsens when sits down and gets up- been going on for a long time Stomach pain, and constant growling, pain from surgery BS @ home-100-160   History of Present Illness: c/o  right buttock pain for a while, intense, constant, worses when sits down and gets up no previous XR, MRI or orthopedic evaluation  takes Tylenol from time to time with minimal  help no history of recent fall history of hydrocodone intolerance  also complained of "stomach  constant growling" denies vomiting or diarrhea, sometimes mild nausea bowel movements are regular, no blood in the stools she takes medication for GERD, heartburn well-controlled  also has a keloid and scar in the abdomen from previous surgery, complaining of pain at the actual keloid    BS @ home-100-160   Allergies: 1)  ! Hydrocodone  Past History:  Past Medical History: Reviewed history from 07/11/2009 and no changes required. ASTHMA Diabetes mellitus, type II HTN Hyperlipidemia CAD, s/p CABG, s/p Ao Valve replacement ([facility]) UTI'S, RECURRENT   HYPERTENSION  ATRIAL FIBRILLATION, PAROXYSMAL--cards d/c coumadin 12-2008 d/t persistent NSR and frecuent falls  HYPERTHYROIDISM  dry skin Osteoarthritis Osteopenia,per DEXA 12-09 Former heavy smoker  Past Surgical History: Reviewed history from 06/16/2007 and no changes required. Hysterectomy Total knee replacement  1999 Oophorectomy Coronary artery bypass graft  1998 Caesarean section x 2 Hemorrhoidectomy  Review of Systems      See HPI  Physical Exam  General:  alert and well-developed.   Lungs:  normal respiratory effort, no intercostal retractions, no accessory muscle  use, and normal breath sounds.  No wheezing  Heart:  seems regular today  soft syst. murmur Abdomen:  soft and no distention.  he has a large keloid the mid-abdomen, there is no warmness, swelling, redness.  The most left-sided area of the keloid  is the one that hurts but again there is no tenderness to palpation Msk:  tender  at the rightsacroiliac area. Palpation of both  gluteal areas show no actual mass, hematoma or abscess hip  rotation on the right slightly painful walks with a cane with difficulty, has a hard time getting  up from the chair Extremities:  no edema     Impression & Recommendations:  Problem # 1:  OSTEOARTHRITIS (ICD-715.90) hip  pain likely from OA x-ray ortho referal  trial with Ultram, history of hydrocodone intolerance has a hard time getting out of a chair, physical therapy referral?, patient declined, likes to see how tramadol works for her Her updated medication list for this problem includes:    Baby Aspirin 81 Mg Chew (Aspirin)    Tramadol Hcl 50 Mg Tabs (Tramadol hcl) ..... Half to one tablet 4 times a day as needed for pain  Orders: T-Hip Comp Right Min 2 views ([id number]) Orthopedic Referral (Ortho)  Problem # 2:  DYSPEPSIA (ICD-536.8) GI symptoms today are mostly increased bowel sounds, otherwise review of systems is benign plan: observation for now  Problem # 3:  KELOID SCAR, HX OF (ICD-V13.3) keloid has been painful for a while, nupercainal as needed if no better , refer to  to surgery   Complete Medication  List: 1)  Baby Aspirin 81 Mg Chew (Aspirin) 2)  Methimazole 5 Mg Tabs (Methimazole) .Marland Kitchen.. 1 by mouth once daily 3)  Nitroquick 0.3 Mg Subl (Nitroglycerin) .... As directed 4)  Metoprolol Tartrate 25 Mg Tabs (Metoprolol tartrate) .... 1/2 tab by mouth two times a day 5)  Amlodipine Besylate 5 Mg Tabs (Amlodipine besylate) .... One daily 6)  Maxzide 75-50 Mg Tabs (Triamterene-hctz) .... 1/2 by mouth qd 7)  Benazepril Hcl 20 Mg Tabs  (Benazepril hcl) .... 1/2 by mouth qam 8)  Simvastatin 80 Mg Tabs (Simvastatin) .... One p.o. nightly 9)  Zegerid Otc 20-1100 Mg Caps (Omeprazole-sodium bicarbonate) .... As needed 10)  Otc Zantac 75mg   .... 1 tab at bedtime 11)  Alprazolam 0.5 Mg Tabs (Alprazolam) .Marland Kitchen.. 1 by mouth at bedtime 12)  Freestyle Lite Strp (Glucose blood) .... Checks blood sugar three times a day dx 250.00 13)  Freestyle Lite Lancets  .... Checks blood sugar three times a day dx 250.00 14)  Hydroxyzine Hcl 10 Mg Tabs (Hydroxyzine hcl) .Marland Kitchen.. 1 by mouth every 8 hours as needed itching 15)  Flexeril 10 Mg Tabs (Cyclobenzaprine hcl) .Marland Kitchen.. 1 by mouth at bedtime as needed 16)  Cal/d  17)  Meclizine Hcl 25 Mg Tabs (Meclizine hcl) .... 1/2-1 by mouth every 6 hours as needed vertigo 18)  Ventolin Hfa 108 (90 Base) Mcg/act Aers (Albuterol sulfate) .... Two puffs every 6 hours p.r.n. wheezing 19)  Ergocalciferol 50000 Unit Caps (Ergocalciferol) .... Take 1 tab once a week 20)  Tramadol Hcl 50 Mg Tabs (Tramadol hcl) .... Half to one tablet 4 times a day as needed for pain  Patient Instructions: 1)  Please schedule a follow-up appointment in 3 months .  2)  get the x-ray 3)  Ultram as needed for pain, watch for drowsiness 4)  nupercainal , over-the-counter cream, apply to your stomach twice a day Prescriptions: TRAMADOL HCL 50 MG TABS (TRAMADOL HCL) half to one tablet 4 times a day as needed for pain  #40 x 0   Entered and Authorized by:   Nolon Rod. Christop Hippert MD   Signed by:   Nolon Rod. Ankit Degregorio MD on 08/29/2009   Method used:   Print then Give to Patient   RxID:   [id number]

## 2010-07-10 NOTE — Consult Note (Signed)
Summary: on PPIs?? Rx nexium, pt not interested on  [facility]--GI  [facility]   Imported By: Lanelle Bal 02/01/2010 12:54:36  _____________________________________________________________________  External Attachment:    Type:   Image     Comment:   External Document

## 2010-07-10 NOTE — Assessment & Plan Note (Signed)
Summary: hosp f/u /cbs   Vital Signs:  Patient profile:   77 year old female Weight:      174.13 pounds Pulse rate:   47 / minute Pulse rhythm:   regular BP sitting:   142 / 80  (left arm) Cuff size:   regular  Vitals Entered By: Army Fossa CMA (May 16, 2010 1:50 PM) CC: Hospital f/u- not fasting  Comments [facility] [address]    History of Present Illness: ER followup.... On 05-10-10 she went to PT, they noted her  BP  to be quite elevated  ( ~200/97 per pt)  ; she become dizzy while overthere after she moved her head rapidly . She was referred to the ER.  ER notes reviewed BP over there was 164/67, pulse 58 Initially,potassium was 5.5, rechecked potassium was 4.1. Creatinine 1.1 Hemoglobin 14.6  INR 1.78 Urinalysis negative Chest x-ray no acute EKG sinus bradycardia   ROS since she left the hospital, she has been check her BP, it varies, range is from 106/53, to 168/80. The last 2 days it has been pretty normal Denies chest pain Admits to some difficulty breathing, slightly above baseline. Denies fever, cough or lower extremity edema  Current Medications (verified): 1)  Baby Aspirin 81 Mg  Chew (Aspirin) 2)  Methimazole 5 Mg  Tabs (Methimazole) .Marland Kitchen.. 1 By Mouth Once Daily 3)  Nitroquick 0.3 Mg  Subl (Nitroglycerin) .... As Directed 4)  Metoprolol Tartrate 25 Mg  Tabs (Metoprolol Tartrate) .Marland Kitchen.. 1 By Mouth Three Times A Day 5)  Amlodipine Besylate 5 Mg  Tabs (Amlodipine Besylate) .... One Daily 6)  Maxzide 75-50 Mg  Tabs (Triamterene-Hctz) .... 1/2 By Mouth Qd 7)  Benazepril Hcl 20 Mg  Tabs (Benazepril Hcl) .... 1/2 By Mouth Qam 8)  Lipitor 40 Mg Tabs (Atorvastatin Calcium) .Marland Kitchen.. 1 By Mouth At Bedtime. 9)  Zegerid Otc 20-1100 Mg Caps (Omeprazole-Sodium Bicarbonate) .... As Needed 10)  Alprazolam 0.5 Mg  Tabs (Alprazolam) .Marland Kitchen.. 1 By Mouth At Bedtime 11)  Freestyle Lite   Strp (Glucose Blood) .... Checks Blood Sugar Three Times A Day Dx 250.00 12)  Freestyle Lancets   Misc (Lancets) .... Checks Blood Sugar Three Times A Day Dx 250.00 13)  Hydroxyzine Hcl 10 Mg Tabs (Hydroxyzine Hcl) .Marland Kitchen.. 1 By Mouth Every 8 Hours As Needed Itching 14)  Cal/d 15)  Ventolin Hfa 108 (90 Base) Mcg/act Aers (Albuterol Sulfate) .... Two Puffs Every 6 Hours P.r.n. Wheezing 16)  Coumadin 5 Mg Solr (Warfarin Sodium) .... Per Cardiology 17)  Meclizine Hcl 25 Mg Tabs (Meclizine Hcl) .... Take 1/2 To 1 Tablet Every 6 Hours As Needed For Vertigo  Allergies (verified): 1)  ! Hydrocodone 2)  ! Ultram (Tramadol Hcl)  Past History:  Past Medical History: Reviewed history from 04/19/2010 and no changes required. ASTHMA Diabetes mellitus, type II HTN Hyperlipidemia CAD, s/p CABG, s/p Ao Valve replacement ([facility]) UTI'S, RECURRENT   HYPERTENSION  ATRIAL FIBRILLATION, PAROXYSMAL--cards d/c coumadin 12-2008 d/t persistent NSR and frecuent falls ---restarted coumadin  ~ July 2011 HYPERTHYROIDISM  dry skin Osteoarthritis Osteopenia,per DEXA 12-09 Former heavy smoker  Past Surgical History: Reviewed history from 06/16/2007 and no changes required. Hysterectomy Total knee replacement  1999 Oophorectomy Coronary artery bypass graft  1998 Caesarean section x 2 Hemorrhoidectomy  Physical Exam  General:  alert and well-developed.   Neck:  no JVD at 45 Lungs:  normal respiratory effort, no intercostal retractions, no accessory muscle use, slight  decreased breath sounds  Heart:  seems regular today  soft syst. murmur Extremities:  no edema Psych:  not anxious appearing and not depressed appearing.     Impression & Recommendations:  Problem # 1:  HYPERTENSION (ICD-401.9) her BP  was recently elevated at the time of a PT visit. Her BP is trending down. I prefer not to make any changes at this time, will ask her to continue monitoring her BP she feels slightly short of breath, no evidence of vol overload.  See instructions Her updated medication list for this  problem includes:    Metoprolol Tartrate 25 Mg Tabs (Metoprolol tartrate) .Marland Kitchen... 1 by mouth three times a day    Amlodipine Besylate 5 Mg Tabs (Amlodipine besylate) ..... One daily    Maxzide 75-50 Mg Tabs (Triamterene-hctz) .Marland Kitchen... 1/2 by mouth qd    Benazepril Hcl 20 Mg Tabs (Benazepril hcl) .Marland Kitchen... 1/2 by mouth qam  BP today: 142/80 Prior BP: 128/86 (04/19/2010)  Prior 10 Yr Risk Heart Disease: N/A (07/11/2009)  Labs Reviewed: K+: 3.6 (04/20/2010) Creat: : 1.0 (04/20/2010)   Chol: 119 (07/11/2009)   HDL: 50.20 (07/11/2009)   LDL: 57 (07/11/2009)   TG: 58.0 (07/11/2009)  Problem # 2:  HYPERTHYROIDISM (ICD-242.90)  she has a long history of hyperthyroidism. last year we attempted to discontinue methimazole but the  TSH decreased below normal. At this point, I am little hesitant to continue with methimazole chronically given the risk of a granulocytosis. Plan: hold  methimazole refer to Dr. Everardo All. If she needs further treatment for hyperthyroidism should we continue with methimazole or go  for an  ablation.?  The following medications were removed from the medication list:    Methimazole 5 Mg Tabs (Methimazole) .Marland Kitchen... 1 by mouth once daily Her updated medication list for this problem includes:    Metoprolol Tartrate 25 Mg Tabs (Metoprolol tartrate) .Marland Kitchen... 1 by mouth three times a day  Orders: Endocrinology Referral (Endocrine)  Problem # 3:  ATRIAL FIBRILLATION, PAROXYSMAL (ICD-427.31) INR 2 days ago was no therapeutic. Encourage to call cardiology and let them known. we will also call cards  Her updated medication list for this problem includes:    Baby Aspirin 81 Mg Chew (Aspirin)    Metoprolol Tartrate 25 Mg Tabs (Metoprolol tartrate) .Marland Kitchen... 1 by mouth three times a day    Amlodipine Besylate 5 Mg Tabs (Amlodipine besylate) ..... One daily    Coumadin 5 Mg Solr (Warfarin sodium) .Marland Kitchen... Per cardiology  Complete Medication List: 1)  Baby Aspirin 81 Mg Chew (Aspirin) 2)   Nitroquick 0.3 Mg Subl (Nitroglycerin) .... As directed 3)  Metoprolol Tartrate 25 Mg Tabs (Metoprolol tartrate) .Marland Kitchen.. 1 by mouth three times a day 4)  Amlodipine Besylate 5 Mg Tabs (Amlodipine besylate) .... One daily 5)  Maxzide 75-50 Mg Tabs (Triamterene-hctz) .... 1/2 by mouth qd 6)  Benazepril Hcl 20 Mg Tabs (Benazepril hcl) .... 1/2 by mouth qam 7)  Lipitor 40 Mg Tabs (Atorvastatin calcium) .Marland Kitchen.. 1 by mouth at bedtime. 8)  Zegerid Otc 20-1100 Mg Caps (Omeprazole-sodium bicarbonate) .... As needed 9)  Alprazolam 0.5 Mg Tabs (Alprazolam) .Marland Kitchen.. 1 by mouth at bedtime 10)  Freestyle Lite Strp (Glucose blood) .... Checks blood sugar three times a day dx 250.00 11)  Freestyle Lancets Misc (Lancets) .... Checks blood sugar three times a day dx 250.00 12)  Hydroxyzine Hcl 10 Mg Tabs (Hydroxyzine hcl) .Marland Kitchen.. 1 by mouth every 8 hours as needed itching 13)  Cal/d  14)  Ventolin Hfa 108 (90 Base)  Mcg/act Aers (Albuterol sulfate) .... Two puffs every 6 hours p.r.n. wheezing 15)  Coumadin 5 Mg Solr (Warfarin sodium) .... Per cardiology 16)  Meclizine Hcl 25 Mg Tabs (Meclizine hcl) .... Take 1/2 to 1 tablet every 6 hours as needed for vertigo  Patient Instructions: 1)  if you get more short of breath  let us know  2)  Please stop  methimazole 3)  Please call cardiology, your Coumadin was not at a  good level when you  went to the ER  4)  Please come back in January for  your routine checkup 5)  check your BP daily, call us with readings next week   Orders Added: 1)  Est. Patient Level IV [16109] 2)  Endocrinology Referral [Endocrine]

## 2010-07-10 NOTE — Progress Notes (Signed)
Summary: refill  Phone Note Refill Request Message from:  Fax from Pharmacy on January 19, 2010 9:29 AM  Refills Requested: Medication #1:  ALPRAZOLAM 0.5 MG  TABS 1 by mouth at bedtime [facility] - fax [phone]  Initial call taken by: Okey Regal [place],  January 19, 2010 9:35 AM  Follow-up for Phone Call        ok 30 and 6 RF Jose E. Paz MD  January 19, 2010 11:31 AM     Prescriptions: ALPRAZOLAM 0.5 MG  TABS (ALPRAZOLAM) 1 by mouth at bedtime  #30 x 6   Entered by:   Army Fossa CMA   Authorized by:   Nolon Rod. Paz MD   Signed by:   Army Fossa CMA on 01/19/2010   Method used:   Printed then faxed to ...       [id number] (retail)       [address]       [place], [place]  [address]       Ph: [phone] or [phone]       Fax: [phone]   RxID:   [id number]

## 2010-07-10 NOTE — Progress Notes (Signed)
Summary: Med Verification  Phone Note Call from Patient Call back at Home Phone [phone]   Caller: Patient Summary of Call: Pt states that she was instructed to call you to verify a dosage on her medication for the Methimazole she is taking 1 tab daily. Continue with this or take 1/2 tab.  Initial call taken by: Army Fossa CMA,  April 23, 2010 1:57 PM  Follow-up for Phone Call        if she has been taking one tablet of methimazole all along , then continue with one tablet  daily Follow-up by: [facility] E. Aleksi Brummet MD,  April 23, 2010 5:11 PM  Additional Follow-up for Phone Call Additional follow up Details #1::        Left message for pt to call back. Army Fossa CMA  April 24, 2010 8:15 AM   Patient notes that she takes 1/2 tab daily and is not having any problems with it. I advised the patient to take prescription as written. Lucious Groves CMA  April 24, 2010 11:19 AM     New/Updated Medications: METHIMAZOLE 5 MG  TABS (METHIMAZOLE) 1 by mouth once daily

## 2010-07-10 NOTE — Letter (Signed)
Summary: [facility]  [facility]   Imported By: Lanelle Bal 06/30/2009 09:54:29  _____________________________________________________________________  External Attachment:    Type:   Image     Comment:   External Document

## 2010-07-10 NOTE — Assessment & Plan Note (Signed)
Summary: 5 MTH FU/NS/KDC   Vital Signs:  Patient profile:   77 year old female Height:      61.75 inches Weight:      174 pounds BMI:     32.20 Pulse rate:   56 / minute Pulse rhythm:   regular BP sitting:   126 / 84  (left arm) Cuff size:   regular  Vitals Entered By: Army Fossa CMA (December 18, 2009 9:45 AM) CC: Pt here for 5 month f/u. Comments Pt is having an MRI tomorrow she states    History of Present Illness: cardiology note from 6-23 reviewed: Had chest pain, schedule for Cardiolite Continued to have GI symptoms-- "increase noise from the stomach" and pain all over the abdomen,no change w/  food intake Labs were  ordered  including TFTs he increased metoprolol    Diabetes -- ambulatory CBGs daily , forgot her log  HTN-- ambulatory BPs "good", can't tell readings ,  bb dose increased per cards  ATRIAL FIBRILLATION, PAROXYSMAL-- BB dose increased , they also restart Coumadin HYPERTHYROIDISM -- good medication compliance      Allergies: 1)  ! Hydrocodone 2)  ! Ultram (Tramadol Hcl)  Past History:  Past Medical History: ASTHMA Diabetes mellitus, type II HTN Hyperlipidemia CAD, s/p CABG, s/p Ao Valve replacement ([facility]) UTI'S, RECURRENT   HYPERTENSION  ATRIAL FIBRILLATION, PAROXYSMAL--cards d/c coumadin 12-2008 d/t persistent NSR and frecuent falls  HYPERTHYROIDISM  dry skin Osteoarthritis Osteopenia,per DEXA 12-09 Former heavy smoker  Past Surgical History: Reviewed history from 06/16/2007 and no changes required. Hysterectomy Total knee replacement  1999 Oophorectomy Coronary artery bypass graft  1998 Caesarean section x 2 Hemorrhoidectomy  Social History: Reviewed history from 07/11/2009 and no changes required. Divorced lives by herself still drives  she has one living daughter in [place]-- quit  2004 apros, used to smoke  1.5 ppd  ETOH--no  Review of Systems General:  Denies fever and weight loss. GI:  Denies  bloody stools and diarrhea; occasionally heartburn some nausea, no vomiting . GU:  Denies dysuria and hematuria.  Physical Exam  General:  alert and well-developed.   Lungs:  normal respiratory effort, no intercostal retractions, no accessory muscle use, and normal breath sounds.   Heart:  seems regular today  soft syst. murmur Abdomen:  soft, non-tender, no distention, no masses, no guarding, and no rigidity.   Extremities:  no pretibial edema bilaterally    Impression & Recommendations:  Problem # 1:  DYSPEPSIA (ICD-536.8) continue with dyspepsia refer to GI Dr. Carney Bern  Orders: Gastroenterology Referral (GI)  Problem # 2:  HYPERTENSION (ICD-401.9) metoprolol dose recently increased, patient tolerating well Her updated medication list for this problem includes:    Metoprolol Tartrate 25 Mg Tabs (Metoprolol tartrate) .Marland Kitchen... 1 by mouth three times a day    Amlodipine Besylate 5 Mg Tabs (Amlodipine besylate) ..... One daily    Maxzide 75-50 Mg Tabs (Triamterene-hctz) .Marland Kitchen... 1/2 by mouth qd    Benazepril Hcl 20 Mg Tabs (Benazepril hcl) .Marland Kitchen... 1/2 by mouth qam  BP today: 126/84 Prior BP: 124/66 (08/29/2009)  Prior 10 Yr Risk Heart Disease: N/A (07/11/2009)  Labs Reviewed: K+: 3.5 (07/11/2009) Creat: : 1.1 (07/11/2009)   Chol: 119 (07/11/2009)   HDL: 50.20 (07/11/2009)   LDL: 57 (07/11/2009)   TG: 58.0 (07/11/2009)  Problem # 3:  DIABETES MELLITUS, TYPE II (ICD-250.00) labs  Her updated medication list for this problem includes:    Baby Aspirin 81 Mg Chew (Aspirin)  Benazepril Hcl 20 Mg Tabs (Benazepril hcl) .Marland Kitchen... 1/2 by mouth qam  Labs Reviewed: Creat: 1.1 (07/11/2009)    Reviewed HgBA1c results: 6.5 (07/11/2009)  6.4 (03/06/2009)  Orders: Venipuncture (16109) TLB-A1C / Hgb A1C (Glycohemoglobin) (83036-A1C) UA Dipstick w/o Micro (automated)  (81003)  Problem # 4:  HYPERTHYROIDISM (ICD-242.90) labs  recently done at cardiology. Will get reports Her updated  medication list for this problem includes:    Methimazole 5 Mg Tabs (Methimazole) .Marland Kitchen... 1 by mouth once daily    Metoprolol Tartrate 25 Mg Tabs (Metoprolol tartrate) .Marland Kitchen... 1 by mouth three times a day  Problem # 5:  CAD (ICD-414.00) to have a  stress test this week Her updated medication list for this problem includes:    Baby Aspirin 81 Mg Chew (Aspirin)    Nitroquick 0.3 Mg Subl (Nitroglycerin) .Marland Kitchen... As directed    Metoprolol Tartrate 25 Mg Tabs (Metoprolol tartrate) .Marland Kitchen... 1 by mouth three times a day    Amlodipine Besylate 5 Mg Tabs (Amlodipine besylate) ..... One daily    Maxzide 75-50 Mg Tabs (Triamterene-hctz) .Marland Kitchen... 1/2 by mouth qd    Benazepril Hcl 20 Mg Tabs (Benazepril hcl) .Marland Kitchen... 1/2 by mouth qam  Complete Medication List: 1)  Baby Aspirin 81 Mg Chew (Aspirin) 2)  Methimazole 5 Mg Tabs (Methimazole) .Marland Kitchen.. 1 by mouth once daily 3)  Nitroquick 0.3 Mg Subl (Nitroglycerin) .... As directed 4)  Metoprolol Tartrate 25 Mg Tabs (Metoprolol tartrate) .Marland Kitchen.. 1 by mouth three times a day 5)  Amlodipine Besylate 5 Mg Tabs (Amlodipine besylate) .... One daily 6)  Maxzide 75-50 Mg Tabs (Triamterene-hctz) .... 1/2 by mouth qd 7)  Benazepril Hcl 20 Mg Tabs (Benazepril hcl) .... 1/2 by mouth qam 8)  Simvastatin 80 Mg Tabs (Simvastatin) .... One p.o. nightly 9)  Zegerid Otc 20-1100 Mg Caps (Omeprazole-sodium bicarbonate) .... As needed 10)  Alprazolam 0.5 Mg Tabs (Alprazolam) .Marland Kitchen.. 1 by mouth at bedtime 11)  Freestyle Lite Strp (Glucose blood) .... Checks blood sugar three times a day dx 250.00 12)  Freestyle Lite Lancets  .... Checks blood sugar three times a day dx 250.00 13)  Hydroxyzine Hcl 10 Mg Tabs (Hydroxyzine hcl) .Marland Kitchen.. 1 by mouth every 8 hours as needed itching 14)  Cal/d  15)  Ventolin Hfa 108 (90 Base) Mcg/act Aers (Albuterol sulfate) .... Two puffs every 6 hours p.r.n. wheezing 16)  Ergocalciferol 50000 Unit Caps (Ergocalciferol) .... Take 1 tab once a week 17)  Coumadin 5 Mg Solr  (Warfarin sodium) .... Per cardiology Is on  Patient Instructions: 1)  Please schedule a follow-up appointment in 4 months .   Laboratory Results   Urine Tests    Routine Urinalysis   Color: yellow Appearance: Clear Glucose: negative   (Normal Range: Negative) Bilirubin: negative   (Normal Range: Negative) Ketone: negative   (Normal Range: Negative) Spec. Gravity: 1.015   (Normal Range: 1.003-1.035) Blood: negative   (Normal Range: Negative) pH: 6.0   (Normal Range: 5.0-8.0) Protein: negative   (Normal Range: Negative) Urobilinogen: 0.2   (Normal Range: 0-1) Nitrite: negative   (Normal Range: Negative) Leukocyte Esterace: negative   (Normal Range: Negative)    Comments: Army Fossa CMA  December 18, 2009 10:39 AM      Appended Document: 5 MTH FU/NS/KDC labs from cardiology reviewed-- TFTs normal BMP showed a creatinine of 1.2 LFTs normal Good results

## 2010-07-10 NOTE — Assessment & Plan Note (Signed)
Summary: rto 4 months/cbs   Vital Signs:  Patient profile:   77 year old female Weight:      174 pounds Pulse rate:   59 / minute Pulse rhythm:   regular BP sitting:   128 / 86  (left arm) Cuff size:   regular  Vitals Entered By: Army Fossa CMA (April 19, 2010 9:59 AM) CC: 4 month f/u- fasting Comments Fell 5 weeks ago- having back pain. [facility] [address] Flu shot 03/10/10   History of Present Illness: routine office visit  fell 5 weeks ago, hurt the R side of her lower back , no head or neck injury went to a UC, pt reports neg XR still hurts at the R lower back w/o radiation, pain has not improved much  states doesn't like pain medicine    Diabetes-- ambulatory CBGs  ~130 HTN-- ambulatory BPs checked infrecuently , < 140 ATRIAL FIBRILLATION, PAROXYSMAL--cards d/c coumadin 12-2008 d/t persistent NSR and frecuent falls but earlyer this year,  coumadin was restarted     Current Medications (verified): 1)  Baby Aspirin 81 Mg  Chew (Aspirin) 2)  Methimazole 5 Mg  Tabs (Methimazole) .Marland Kitchen.. 1 By Mouth Once Daily 3)  Nitroquick 0.3 Mg  Subl (Nitroglycerin) .... As Directed 4)  Metoprolol Tartrate 25 Mg  Tabs (Metoprolol Tartrate) .Marland Kitchen.. 1 By Mouth Three Times A Day 5)  Amlodipine Besylate 5 Mg  Tabs (Amlodipine Besylate) .... One Daily 6)  Maxzide 75-50 Mg  Tabs (Triamterene-Hctz) .... 1/2 By Mouth Qd 7)  Benazepril Hcl 20 Mg  Tabs (Benazepril Hcl) .... 1/2 By Mouth Qam 8)  Lipitor 40 Mg Tabs (Atorvastatin Calcium) .Marland Kitchen.. 1 By Mouth At Bedtime. 9)  Zegerid Otc 20-1100 Mg Caps (Omeprazole-Sodium Bicarbonate) .... As Needed 10)  Alprazolam 0.5 Mg  Tabs (Alprazolam) .Marland Kitchen.. 1 By Mouth At Bedtime 11)  Freestyle Lite   Strp (Glucose Blood) .... Checks Blood Sugar Three Times A Day Dx 250.00 12)  Freestyle Lancets  Misc (Lancets) .... Checks Blood Sugar Three Times A Day Dx 250.00 13)  Hydroxyzine Hcl 10 Mg Tabs (Hydroxyzine Hcl) .Marland Kitchen.. 1 By Mouth Every 8 Hours As Needed Itching 14)   Cal/d 15)  Ventolin Hfa 108 (90 Base) Mcg/act Aers (Albuterol Sulfate) .... Two Puffs Every 6 Hours P.r.n. Wheezing 16)  Ergocalciferol 50000 Unit Caps (Ergocalciferol) .... Take 1 Tab Once A Week 17)  Coumadin 5 Mg Solr (Warfarin Sodium) .... Per Cardiology  Allergies (verified): 1)  ! Hydrocodone 2)  ! Ultram (Tramadol Hcl)  Past History:  Past Medical History: ASTHMA Diabetes mellitus, type II HTN Hyperlipidemia CAD, s/p CABG, s/p Ao Valve replacement ([facility]) UTI'S, RECURRENT   HYPERTENSION  ATRIAL FIBRILLATION, PAROXYSMAL--cards d/c coumadin 12-2008 d/t persistent NSR and frecuent falls ---restarted coumadin  ~ July 2011 HYPERTHYROIDISM  dry skin Osteoarthritis Osteopenia,per DEXA 12-09 Former heavy smoker  Past Surgical History: Reviewed history from 06/16/2007 and no changes required. Hysterectomy Total knee replacement  1999 Oophorectomy Coronary artery bypass graft  1998 Caesarean section x 2 Hemorrhoidectomy  Social History: Divorced lives by herself, has life alert still drives  she has one living daughter in [place] Tobacco-- quit  2004 apros, used to smoke  1.5 ppd  ETOH--no  Review of Systems CV:  Denies chest pain or discomfort and swelling of feet. Resp:  wheezing on-off , uses albuterol << 3 times a week  . GI:  Denies bloody stools, nausea, and vomiting. GU:  Denies dysuria and hematuria.  Physical Exam  General:  alert and well-developed.   Lungs:  normal respiratory effort, no intercostal retractions, no accessory muscle use, the decreased breath sounds Heart:  seems regular today  soft syst. murmur Msk:  slightly tender to palpation over the lower spine, worst area is right from L2 Psych:  not anxious appearing and not depressed appearing.     Impression & Recommendations:  Problem # 1:  HYPERTENSION (ICD-401.9) at goal  Her updated medication list for this problem includes:    Metoprolol Tartrate 25 Mg Tabs (Metoprolol  tartrate) .Marland Kitchen... 1 by mouth three times a day    Amlodipine Besylate 5 Mg Tabs (Amlodipine besylate) ..... One daily    Maxzide 75-50 Mg Tabs (Triamterene-hctz) .Marland Kitchen... 1/2 by mouth qd    Benazepril Hcl 20 Mg Tabs (Benazepril hcl) .Marland Kitchen... 1/2 by mouth qam  BP today: 128/86 Prior BP: 126/84 (12/18/2009)  Prior 10 Yr Risk Heart Disease: N/A (07/11/2009)  Labs Reviewed: K+: 3.5 (07/11/2009) Creat: : 1.1 (07/11/2009)   Chol: 119 (07/11/2009)   HDL: 50.20 (07/11/2009)   LDL: 57 (07/11/2009)   TG: 58.0 (07/11/2009)  Orders: TLB-BMP (Basic Metabolic Panel-BMET) (80048-METABOL) TLB-CBC Platelet - w/Differential (85025-CBCD)  Problem # 2:  HEALTH SCREENING (ICD-V70.0) due for a complete physical, chart reviewed  in preparation of CPX pneumonia shot 2009 flu shot Shingles immunization   Cscope Dr Candelaria Stagers 2005, next in 5 years , patient has declined in the past to have further screenings  PAP-- h/o hysterectomy due to bleeding, last PAP? >5 years ago per  patient   B MMG  10/2007 f/u by a left ultrasound and mammogram 11/2007-------> B MMG neg 7-11  Problem # 3:  DIABETES MELLITUS, TYPE II (ICD-250.00) labs Her updated medication list for this problem includes:    Baby Aspirin 81 Mg Chew (Aspirin)    Benazepril Hcl 20 Mg Tabs (Benazepril hcl) .Marland Kitchen... 1/2 by mouth qam  Labs Reviewed: Creat: 1.1 (07/11/2009)    Reviewed HgBA1c results: 6.6 (12/18/2009)  6.5 (07/11/2009)  Orders: TLB-A1C / Hgb A1C (Glycohemoglobin) (83036-A1C)  Problem # 4:  HYPERTHYROIDISM (ICD-242.90) based on the last TSH, she was recommended to take half methimazole , patient states that is waht she is doing   Her updated medication list for this problem includes:    Methimazole 5 Mg Tabs (Methimazole) .Marland Kitchen... 1/2 by mouth once daily    Metoprolol Tartrate 25 Mg Tabs (Metoprolol tartrate) .Marland Kitchen... 1 by mouth three times a day  Labs Reviewed: TSH: 7.90 (07/11/2009)     Orders: TLB-TSH (Thyroid Stimulating Hormone)  (84443-TSH)  Problem # 5:  OSTEOPENIA (ICD-733.90) last bone density test 12- 2009 Her updated medication list for this problem includes:    Ergocalciferol 50000 Unit Caps (Ergocalciferol) .Marland Kitchen... Take 1 tab once a week  Orders: Venipuncture ([address]) T-Vitamin D (25-Hydroxy) (60454-09811)  Problem # 6:  CONTUSION, BACK (ICD-922.31) see history of present illness repeat X-rays  Orders: T-Lumbar Spine Complete, 5 Views (71110TC)  Problem # 7:  GAIT DISTURBANCE (ICD-781.2) she has frequent falls In the past Coumadin was discontinued for this reason but she is back on it per cardiology plan: PT referral. Currently uses a cane has a  walker but does not use it (rec to do!)  Orders: Physical Therapy Referral (PT)  Complete Medication List: 1)  Baby Aspirin 81 Mg Chew (Aspirin) 2)  Methimazole 5 Mg Tabs (Methimazole) .... 1/2 by mouth once daily 3)  Nitroquick 0.3 Mg Subl (Nitroglycerin) .... As directed 4)  Metoprolol Tartrate 25 Mg Tabs (  Metoprolol tartrate) .Marland Kitchen.. 1 by mouth three times a day 5)  Amlodipine Besylate 5 Mg Tabs (Amlodipine besylate) .... One daily 6)  Maxzide 75-50 Mg Tabs (Triamterene-hctz) .... 1/2 by mouth qd 7)  Benazepril Hcl 20 Mg Tabs (Benazepril hcl) .... 1/2 by mouth qam 8)  Lipitor 40 Mg Tabs (Atorvastatin calcium) .Marland Kitchen.. 1 by mouth at bedtime. 9)  Zegerid Otc 20-1100 Mg Caps (Omeprazole-sodium bicarbonate) .... As needed 10)  Alprazolam 0.5 Mg Tabs (Alprazolam) .Marland Kitchen.. 1 by mouth at bedtime 11)  Freestyle Lite Strp (Glucose blood) .... Checks blood sugar three times a day dx 250.00 12)  Freestyle Lancets Misc (Lancets) .... Checks blood sugar three times a day dx 250.00 13)  Hydroxyzine Hcl 10 Mg Tabs (Hydroxyzine hcl) .Marland Kitchen.. 1 by mouth every 8 hours as needed itching 14)  Cal/d  15)  Ventolin Hfa 108 (90 Base) Mcg/act Aers (Albuterol sulfate) .... Two puffs every 6 hours p.r.n. wheezing 16)  Ergocalciferol 50000 Unit Caps (Ergocalciferol) .... Take 1 tab once a  week 17)  Coumadin 5 Mg Solr (Warfarin sodium) .... Per cardiology  Patient Instructions: 1)  Please schedule a follow-up appointment in 2 months, fasting, physical exam   Orders Added: 1)  Venipuncture [36415] 2)  T-Vitamin D (25-Hydroxy) [04540-98119] 3)  TLB-BMP (Basic Metabolic Panel-BMET) [80048-METABOL] 4)  TLB-CBC Platelet - w/Differential [85025-CBCD] 5)  TLB-TSH (Thyroid Stimulating Hormone) [84443-TSH] 6)  TLB-A1C / Hgb A1C (Glycohemoglobin) [83036-A1C] 7)  T-Lumbar Spine Complete, 5 Views [71110TC] 8)  Est. Patient Level IV [14782] 9)  Physical Therapy Referral [PT]   Immunization History:  Influenza Immunization History:    Influenza:  historical (03/10/2010)   Immunization History:  Influenza Immunization History:    Influenza:  Historical (03/10/2010)

## 2010-07-10 NOTE — Assessment & Plan Note (Signed)
Summary: EST MEDICARE  /PH   Vital Signs:  Patient profile:   77 year old female Height:      61.75 inches Weight:      178.6 pounds Pulse rate:   68 / minute BP sitting:   130 / 78  Vitals Entered By: rachel peeler CC: rov/fasting   History of Present Illness: ASTHMA--  asx lately   Diabetes-- ambulatory CBGs in the 110-120 range, occasionally more   HTN-- ambulatory BPs "good" but readings varies from time to time   CAD, last note from cardiology 06/2009 reviewed, patient  stable    HYPERTHYROIDISM --methimazole was temporarily healed, TSH was rechecked and it was decreased.  She is back on methimazole  Osteopenia-- does take Ca and Vit D    Allergies: 1)  ! Hydrocodone  Past History:  Past Medical History: ASTHMA Diabetes mellitus, type II HTN Hyperlipidemia CAD, s/p CABG, s/p Ao Valve replacement ([facility]) UTI'S, RECURRENT   HYPERTENSION  ATRIAL FIBRILLATION, PAROXYSMAL--cards d/c coumadin 12-2008 d/t persistent NSR and frecuent falls  HYPERTHYROIDISM  dry skin Osteoarthritis Osteopenia,per DEXA 12-09 Former heavy smoker  Past Surgical History: Reviewed history from 06/16/2007 and no changes required. Hysterectomy Total knee replacement  1999 Oophorectomy Coronary artery bypass graft  1998 Caesarean section x 2 Hemorrhoidectomy  Family History: Reviewed history from 11/24/2007 and no changes required. colon ca--no breast ca--no CAD - son deceased age 48 MI, F HTN - bro stroke - Bro  Social History: Divorced lives by herself still drives  she has one living daughter in [place] Tobacco-- quit  2004 apros, used to smoke  1.5 ppd  ETOH--no  Review of Systems CV:  Denies chest pain or discomfort and swelling of feet. Resp:  Denies cough, shortness of breath, and wheezing. GI:  Denies bloody stools, diarrhea, nausea, and vomiting. GU:  Denies dysuria and hematuria. Psych:  Denies anxiety and depression.  Physical  Exam  General:  alert and well-developed.   Breasts:  No mass, nodules, thickening, tenderness, bulging, retraction, inflamation, nipple discharge or skin changes noted.  no lymphadenopathy Lungs:  normal respiratory effort, no intercostal retractions, no accessory muscle use, and normal breath sounds.  No wheezing  Heart:  seems regular today  soft syst. murmur Abdomen:  soft, non-tender, no distention, no masses, no guarding, and no rigidity.   Extremities:  no pretibial edema bilaterally  Psych:  not anxious appearing and not depressed appearing.     Impression & Recommendations:  Problem # 1:  HYPERLIPIDEMIA (ICD-272.4) labs , will forward results to cardiology Her updated medication list for this problem includes:    Simvastatin 80 Mg Tabs (Simvastatin) ..... One p.o. nightly  Orders: TLB-Lipid Panel (80061-LIPID)  Labs Reviewed: SGOT: 26 (03/06/2009)   SGPT: 18 (03/06/2009)   HDL:54.4 (04/29/2008), 48.2 (06/16/2007)  LDL:85 (04/29/2008), 96 (06/16/2007)  Chol:153 (04/29/2008), 157 (06/16/2007)  Trig:67 (04/29/2008), 65 (06/16/2007)  Problem # 2:  HEALTH SCREENING (ICD-V70.0) last Td? declined to have one  pneumonia shot--2009 had a flu shot    Cscope Dr Candelaria Stagers 2005, next in 5 years due to personal h/o colon polyps  due for a Cscope but patient likes to stop further colon ca screening   MMG 5-09 Left abnormal, f/u by a 6-09 u/s -MMG which were  ok.  last mammogram 12/2008 normal, breast exam today normal  PAP-- h/o hysterectomy due to bleeding, last PAP? > 3 years ago patient , no h/o abnormal PAPs ever per pt: rec--no further PAP .  Problem # 3:  HYPERTENSION (ICD-401.9) at goal  Her updated medication list for this problem includes:    Metoprolol Tartrate 25 Mg Tabs (Metoprolol tartrate) .Marland Kitchen... 1/2 tab by mouth two times a day    Amlodipine Besylate 5 Mg Tabs (Amlodipine besylate) ..... One daily    Maxzide 75-50 Mg Tabs (Triamterene-hctz) .Marland Kitchen... 1/2 by mouth qd     Benazepril Hcl 20 Mg Tabs (Benazepril hcl) .Marland Kitchen... 1/2 by mouth qam  Orders: Venipuncture (45409) TLB-BMP (Basic Metabolic Panel-BMET) (80048-METABOL)  BP today: 130/78 Prior BP: 120/80 (03/06/2009)  Labs Reviewed: K+: 3.9 (03/06/2009) Creat: : 0.9 (03/06/2009)   Chol: 153 (04/29/2008)   HDL: 54.4 (04/29/2008)   LDL: 85 (04/29/2008)   TG: 67 (04/29/2008)  Problem # 4:  DIABETES MELLITUS, TYPE II (ICD-250.00) on diet only, so far well controlled Her updated medication list for this problem includes:    Baby Aspirin 81 Mg Chew (Aspirin)    Benazepril Hcl 20 Mg Tabs (Benazepril hcl) .Marland Kitchen... 1/2 by mouth qam  Orders: TLB-A1C / Hgb A1C (Glycohemoglobin) (83036-A1C)  Labs Reviewed: Creat: 0.9 (03/06/2009)    Reviewed HgBA1c results: 6.4 (03/06/2009)  6.4 (11/03/2008)  Problem # 5:  CAD (ICD-414.00) asymptomatic, doing well Her updated medication list for this problem includes:    Baby Aspirin 81 Mg Chew (Aspirin)    Nitroquick 0.3 Mg Subl (Nitroglycerin) .Marland Kitchen... As directed    Metoprolol Tartrate 25 Mg Tabs (Metoprolol tartrate) .Marland Kitchen... 1/2 tab by mouth two times a day    Amlodipine Besylate 5 Mg Tabs (Amlodipine besylate) ..... One daily    Maxzide 75-50 Mg Tabs (Triamterene-hctz) .Marland Kitchen... 1/2 by mouth qd    Benazepril Hcl 20 Mg Tabs (Benazepril hcl) .Marland Kitchen... 1/2 by mouth qam  Problem # 6:  ATRIAL FIBRILLATION, PAROXYSMAL (ICD-427.31) off Coumadin due to falls,  stable Her updated medication list for this problem includes:    Baby Aspirin 81 Mg Chew (Aspirin)    Metoprolol Tartrate 25 Mg Tabs (Metoprolol tartrate) .Marland Kitchen... 1/2 tab by mouth two times a day    Amlodipine Besylate 5 Mg Tabs (Amlodipine besylate) ..... One daily  Problem # 7:  OSTEOPENIA (ICD-733.90)  last DEXA was 05/2008 on calcium and vitamin D  Orders: T-Vitamin D (25-Hydroxy) (81191-47829)  Complete Medication List: 1)  Baby Aspirin 81 Mg Chew (Aspirin) 2)  Methimazole 5 Mg Tabs (Methimazole) .Marland Kitchen.. 1 by mouth once  daily 3)  Nitroquick 0.3 Mg Subl (Nitroglycerin) .... As directed 4)  Metoprolol Tartrate 25 Mg Tabs (Metoprolol tartrate) .... 1/2 tab by mouth two times a day 5)  Amlodipine Besylate 5 Mg Tabs (Amlodipine besylate) .... One daily 6)  Maxzide 75-50 Mg Tabs (Triamterene-hctz) .... 1/2 by mouth qd 7)  Benazepril Hcl 20 Mg Tabs (Benazepril hcl) .... 1/2 by mouth qam 8)  Simvastatin 80 Mg Tabs (Simvastatin) .... One p.o. nightly 9)  Zegerid Otc 20-1100 Mg Caps (Omeprazole-sodium bicarbonate) .... As needed 10)  Otc Zantac 75mg   .... 1 tab at bedtime 11)  Alprazolam 0.5 Mg Tabs (Alprazolam) .Marland Kitchen.. 1 by mouth at bedtime 12)  Freestyle Lite Strp (Glucose blood) .... Checks blood sugar three times a day dx 250.00 13)  Freestyle Lite Lancets  .... Checks blood sugar three times a day dx 250.00 14)  Hydroxyzine Hcl 10 Mg Tabs (Hydroxyzine hcl) .Marland Kitchen.. 1 by mouth every 8 hours as needed itching 15)  Flexeril 10 Mg Tabs (Cyclobenzaprine hcl) .Marland Kitchen.. 1 by mouth at bedtime as needed 16)  Cal/d  17)  Meclizine Hcl  25 Mg Tabs (Meclizine hcl) .... 1/2-1 by mouth every 6 hours as needed vertigo 18)  Ventolin Hfa 108 (90 Base) Mcg/act Aers (Albuterol sulfate) .... Two puffs every 6 hours p.r.n. wheezing  Other Orders: TLB-TSH (Thyroid Stimulating Hormone) (84443-TSH)   Patient Instructions: 1)  Please schedule a follow-up appointment in 4 to 5 months .  Prescriptions: AMLODIPINE BESYLATE 5 MG  TABS (AMLODIPINE BESYLATE) one daily  #30 Tablet x 5   Entered by:   Shary Decamp   Authorized by:   Nolon Rod. Paz MD   Signed by:   Shary Decamp on 07/11/2009   Method used:   Electronically to        [id number] (retail)       [address]       [place], [place]  [address]       Ph: [phone] or [phone]       Fax: [phone]   RxID:   [id number] BENAZEPRIL HCL 20 MG  TABS (BENAZEPRIL HCL) 1/2 by mouth QAM  #15 Tablet x 5   Entered by:   Shary Decamp   Authorized by:   Nolon Rod. Paz MD   Signed by:    Shary Decamp on 07/11/2009   Method used:   Electronically to        [id number] (retail)       [address]       [place], [place]  [address]       Ph: [phone] or [phone]       Fax: [phone]   RxID:   [id number] MAXZIDE 75-50 MG  TABS (TRIAMTERENE-HCTZ) 1/2 by mouth QD  #15 Tablet x 5   Entered by:   Shary Decamp   Authorized by:   Nolon Rod. Paz MD   Signed by:   Shary Decamp on 07/11/2009   Method used:   Electronically to        [id number] (retail)       [address]       [place], [place]  [address]       Ph: [phone] or [phone]       Fax: [phone]   RxID:   [id number] METHIMAZOLE 5 MG  TABS (METHIMAZOLE) 1 by mouth once daily  #30 x 5   Entered by:   Shary Decamp   Authorized by:   Nolon Rod. Paz MD   Signed by:   Shary Decamp on 07/11/2009   Method used:   Electronically to        [id number] (retail)       [address]       [place], [place]  [address]       Ph: [phone] or [phone]       Fax: [phone]   RxID:   [id number]

## 2010-07-10 NOTE — Progress Notes (Signed)
Summary: lab results  Phone Note Outgoing Call   Summary of Call: pt aware, rx sent to pharmacy, letter mailed  Initial call taken by: [facility] CMA,  July 18, 2009 11:04 AM    New/Updated Medications: ERGOCALCIFEROL 50000 UNIT CAPS (ERGOCALCIFEROL) take 1 tab once a week Prescriptions: ERGOCALCIFEROL 50000 UNIT CAPS (ERGOCALCIFEROL) take 1 tab once a week  #4 x 3   Entered by:   Jeremy Johann CMA   Authorized by:   Nolon Rod. Paz MD   Signed by:   Jeremy Johann CMA on 07/18/2009   Method used:   Faxed to ...       [id number] (retail)       [address]       [place], [place]  [address]       Ph: [phone] or [phone]       Fax: [phone]   RxID:   [id number]

## 2010-07-10 NOTE — Letter (Signed)
Summary: [facility]  [facility]   Imported By: Lanelle Bal 02/16/2010 09:11:17  _____________________________________________________________________  External Attachment:    Type:   Image     Comment:   External Document

## 2010-07-10 NOTE — Progress Notes (Signed)
Summary: High BP  Phone Note From Other Clinic Call back at [phone]   Summary of Call: PT saw the patient today who complained of dizziness and other visual changes. She notes that the patient BP was 208/98, then 194/94, and then 192/88. The patient has taken her BP med this morning. The patient grand daughter is on her way to the home. They would like to know if the patient should be seen here, go to the ER, or if you have other recommendations. Please advise. Initial call taken by: Lucious Groves CMA,  May 10, 2010 10:27 AM  Follow-up for Phone Call        if chest pain, shortness of breath, dizziness, visual difficulties and elevated BP: Needs to go to the ER if she now feels better and her blood pressure is better she could either see one of my colleagues today or I could see her tomorrow ( add her at around 1 PM) Sharla Tankard E. Zeynab Klett MD  May 10, 2010 11:00 AM   Additional Follow-up for Phone Call Additional follow up Details #1::        I called to notify the patient and her BP is not back up to 210 and she is not feeling better, dizziness continues. She will go to the ER via ambulance. I will call to check on the patient tomorrow. Additional Follow-up by: Lucious Groves CMA,  May 10, 2010 11:04 AM    Additional Follow-up for Phone Call Additional follow up Details #2::    thank you Russell Quinney E. Oney Folz MD  May 10, 2010 1:02 PM   I spoke with the patient and she stayed at the ER until her BP returned to normal. Patient notes that she was given meds and was able to return home. She already has a follow up appt. Lucious Groves CMA  May 11, 2010 1:37 PM   Additional Follow-up for Phone Call Additional follow up Details #3:: Details for Additional Follow-up Action Taken: will see her 05-11-10 Additional Follow-up by: [facility] E. Axelle Szwed MD,  May 11, 2010 2:23 PM

## 2010-07-12 NOTE — Letter (Signed)
Summary: decrease BB, increase amlodipine-benazep-- cardiology  Addendum/[facility]   Imported By: Lanelle Bal 06/29/2010 09:41:02  _____________________________________________________________________  External Attachment:    Type:   Image     Comment:   External Document

## 2010-07-12 NOTE — Assessment & Plan Note (Signed)
Summary: NEW ENDO/HYPERTHYROIDISM/SECURE HORIZONS/#/LB   Vital Signs:  Patient profile:   77 year old female Height:      61.75 inches (156.85 cm) Weight:      177.75 pounds (80.80 kg) BMI:     32.89 O2 Sat:      94 % on Room air Temp:     99.1 degrees F (37.28 degrees C) oral Pulse rate:   65 / minute BP sitting:   114 / 80  (left arm) Cuff size:   regular  Vitals Entered By: Brenton Grills CMA Duncan Dull) (May 25, 2010 2:35 PM)  O2 Flow:  Room air CC: New Endo Consult/Hyperthyroidism/Dr. Paz/aj Is Patient Diabetic? Yes   CC:  New Endo Consult/Hyperthyroidism/Dr. Paz/aj.  History of Present Illness: pt was dx'ed with hyperthyroidism in 2008.  she was rx'ed tapazole.  due to increasing tsh, this was reduced, then stopped altogether last week.   symptomatically, pt states few weeks of slight right sided headache, but no assoc visual loss.    Current Medications (verified): 1)  Baby Aspirin 81 Mg  Chew (Aspirin) 2)  Nitroquick 0.3 Mg  Subl (Nitroglycerin) .... As Directed 3)  Metoprolol Tartrate 25 Mg  Tabs (Metoprolol Tartrate) .Marland Kitchen.. 1 By Mouth Three Times A Day 4)  Amlodipine Besylate 5 Mg  Tabs (Amlodipine Besylate) .... One Daily 5)  Maxzide 75-50 Mg  Tabs (Triamterene-Hctz) .... 1/2 By Mouth Qd 6)  Benazepril Hcl 20 Mg  Tabs (Benazepril Hcl) .... 1/2 By Mouth Qam 7)  Lipitor 40 Mg Tabs (Atorvastatin Calcium) .Marland Kitchen.. 1 By Mouth At Bedtime. 8)  Zegerid Otc 20-1100 Mg Caps (Omeprazole-Sodium Bicarbonate) .... As Needed 9)  Alprazolam 0.5 Mg  Tabs (Alprazolam) .Marland Kitchen.. 1 By Mouth At Bedtime 10)  Freestyle Lite   Strp (Glucose Blood) .... Checks Blood Sugar Three Times A Day Dx 250.00 11)  Freestyle Lancets  Misc (Lancets) .... Checks Blood Sugar Three Times A Day Dx 250.00 12)  Hydroxyzine Hcl 10 Mg Tabs (Hydroxyzine Hcl) .Marland Kitchen.. 1 By Mouth Every 8 Hours As Needed Itching 13)  Cal/d 14)  Ventolin Hfa 108 (90 Base) Mcg/act Aers (Albuterol Sulfate) .... Two Puffs Every 6 Hours P.r.n.  Wheezing 15)  Coumadin 5 Mg Solr (Warfarin Sodium) .... Per Cardiology 16)  Meclizine Hcl 25 Mg Tabs (Meclizine Hcl) .... Take 1/2 To 1 Tablet Every 6 Hours As Needed For Vertigo  Allergies (verified): 1)  ! Hydrocodone 2)  ! Ultram (Tramadol Hcl)  Past History:  Past Medical History: Last updated: 04/19/2010 ASTHMA Diabetes mellitus, type II HTN Hyperlipidemia CAD, s/p CABG, s/p Ao Valve replacement ([facility]) UTI'S, RECURRENT   HYPERTENSION  ATRIAL FIBRILLATION, PAROXYSMAL--cards d/c coumadin 12-2008 d/t persistent NSR and frecuent falls ---restarted coumadin  ~ July 2011 HYPERTHYROIDISM  dry skin Osteoarthritis Osteopenia,per DEXA 12-09 Former heavy smoker  Family History: colon ca--no breast ca--no CAD - son deceased age 37 MI, F HTN - bro stroke - Bro no thyroid dz.  Social History: Reviewed history from 04/19/2010 and no changes required. Divorced x many years. lives by herself, has life alert still drives  she has one living daughter in [place] Tobacco-- quit  2004 apros, used to smoke  1.5 ppd  ETOH--no  Review of Systems       The patient complains of weight gain.  The patient denies fever.         denies hoarseness, double vision, diarrhea, polyuria, excessive diaphoresis, numbness, tremor, anxiety, hypoglycemia, easy bruising, and rhinorrhea.  she has intermittent  palpitations, arthralgias, and slight doe.    Physical Exam  General:  elderly, frail, no distress  Head:  head: no deformity eyes: no periorbital swelling, no proptosis external nose and ears are normal mouth: no lesion seen Neck:  ? of slight enlargement of the right thyroid lobe.  no nodule.   Lungs:  Clear to auscultation bilaterally. Normal respiratory effort.  Heart:  Regular rate and rhythm without murmurs or gallops noted. Normal S1,S2.   Msk:  muscle bulk and strength are grossly normal for age.  no obvious joint swelling.  gait is steady with a cane Pulses:   dorsalis pedis intact bilat.  no carotid bruit  Extremities:  no deformity, except for oa changes.  no edema  Neurologic:  cn 2-12 grossly intact.   readily moves all 4's.   sensation is intact to touch on all 4's Skin:  normal texture and temp.  no rash.  not diaphoretic  Cervical Nodes:  No significant adenopathy.  Psych:  Alert and cooperative; normal mood and affect; normal attention span and concentration.     Impression & Recommendations:  Problem # 1:  HYPERTHYROIDISM (ICD-242.90) at this age, is usually due to small multinodular goiter  Problem # 2:  headache mild.  not related to #1  Problem # 3:  ATRIAL FIBRILLATION, PAROXYSMAL (ICD-427.31) well-controlled  Other Orders: Radiology Referral (Radiology) Consultation Level IV (04540)  Patient Instructions: 1)  let's check an ultrasound of the thyroid.  you will be called with a day and time for an appointment.  please call [phone] to hear your test results. 2)  stay-off the methimazole. 3)  Please schedule a follow-up appointment in 2 months.  our plan should be to wait for the overactive thyroid to come back, then treat it with a pill of radioactive iodine.    Orders Added: 1)  Radiology Referral [Radiology] 2)  Consultation Level IV [78295]

## 2010-07-12 NOTE — Letter (Signed)
Summary: [facility]  [facility]   Imported By: Lanelle Bal 06/29/2010 08:53:18  _____________________________________________________________________  External Attachment:    Type:   Image     Comment:   External Document

## 2010-07-12 NOTE — Assessment & Plan Note (Signed)
Summary: vertigo med is not working///sph   Vital Signs:  Patient profile:   77 year old female Weight:      174.13 pounds Pulse rate:   82 / minute Pulse rhythm:   regular BP sitting:   126 / 84  (left arm) Cuff size:   regular  Vitals Entered By: Army Fossa CMA (June 27, 2010 11:20 AM) CC: Pt here c/o Vertigo. Comments dizziness has been bad for 8 days Meclizine not working Discuss Tapazole [facility] [address]  not fasting    History of Present Illness: 8 days ago, developed severe vertigo. The vertigo is described as spinning and "things moving". Symptoms are clearly triggered by moving her head to the right, not triggered by moving her to the left. She reports 3 similar episodes in the past.  Review of systems No chest pains or palpitations No diplopia She lives by herself and unable to tell if she had slurred speech. No motor deficits She does have some headaches associated with vertigo. In the past she has taken  meclizine but at this time is not helping  Current Medications (verified): 1)  Baby Aspirin 81 Mg  Chew (Aspirin) 2)  Nitroquick 0.3 Mg  Subl (Nitroglycerin) .... As Directed 3)  Metoprolol Tartrate 25 Mg  Tabs (Metoprolol Tartrate) .Marland Kitchen.. 1 By Mouth Three Times A Day 4)  Amlodipine Besylate 5 Mg  Tabs (Amlodipine Besylate) .... One Daily 5)  Maxzide 75-50 Mg  Tabs (Triamterene-Hctz) .... 1/2 By Mouth Qd 6)  Benazepril Hcl 20 Mg  Tabs (Benazepril Hcl) .... 1/2 By Mouth Qam 7)  Lipitor 40 Mg Tabs (Atorvastatin Calcium) .Marland Kitchen.. 1 By Mouth At Bedtime. 8)  Zegerid Otc 20-1100 Mg Caps (Omeprazole-Sodium Bicarbonate) .... As Needed 9)  Alprazolam 0.5 Mg  Tabs (Alprazolam) .Marland Kitchen.. 1 By Mouth At Bedtime 10)  Freestyle Lite   Strp (Glucose Blood) .... Checks Blood Sugar Three Times A Day Dx 250.00 11)  Freestyle Lancets  Misc (Lancets) .... Checks Blood Sugar Three Times A Day Dx 250.00 12)  Cal/d 13)  Coumadin 5 Mg Solr (Warfarin Sodium) .... Per Cardiology 14)   Meclizine Hcl 25 Mg Tabs (Meclizine Hcl) .... Take 1/2 To 1 Tablet Every 6 Hours As Needed For Vertigo 15)  Nexium 40 Mg Cpdr (Esomeprazole Magnesium) .... Qd 16)  Flexeril 10 Mg Tabs (Cyclobenzaprine Hcl) .... As Needed  Allergies (verified): 1)  ! Hydrocodone 2)  ! Ultram (Tramadol Hcl)  Past History:  Past Medical History: Reviewed history from 04/19/2010 and no changes required. ASTHMA Diabetes mellitus, type II HTN Hyperlipidemia CAD, s/p CABG, s/p Ao Valve replacement ([facility]) UTI'S, RECURRENT   HYPERTENSION  ATRIAL FIBRILLATION, PAROXYSMAL--cards d/c coumadin 12-2008 d/t persistent NSR and frecuent falls ---restarted coumadin  ~ July 2011 HYPERTHYROIDISM  dry skin Osteoarthritis Osteopenia,per DEXA 12-09 Former heavy smoker  Past Surgical History: Reviewed history from 06/16/2007 and no changes required. Hysterectomy Total knee replacement  1999 Oophorectomy Coronary artery bypass graft  1998 Caesarean section x 2 Hemorrhoidectomy  Social History: Reviewed history from 05/25/2010 and no changes required. Divorced x many years. lives by herself, has life alert still drives  she has one living daughter in [place] Tobacco-- quit  2004 apros, used to smoke  1.5 ppd  ETOH--no  Physical Exam  General:  alert and well-developed.   Neck:  neck slightly  tender to palpation on the R side carotid pulses + B Heart:  seems regular today  soft syst. murmur Neurologic:  alert &  oriented X3.   EOMI, no nystagmus pupils symmetric and reactive Face symmetric no major motor deficits, generalized debility at baseline Very hard to test her DTRs, I did  not ask the patient to get in the table, she didn't like me to tap her arms due  to arthritis Gait seems unsteady , she almost felt when she stood up  Romberg absent   Impression & Recommendations:  Problem # 1:  DIZZINESS (ICD-780.4) episodes of dizziness that started 8 days ago, she has clearly  peripheral features but also a number of CV risk factors I told her about that admission but she refused. I am concerned about her safety, she lives by herself, still drives and takes Coumadin. I talked to her about getting a driver but she has nobody to drive for her. I think a CT of the head is appropriate but since she is  driving (she lives 3 min from the clinic)  I prefer her to go straight home and rest rather than drive all the way to the hospital and  risk an accident. plan: Rest, fluids, continue meclizine if she changes her mind about admission she is to call me or call 911. I'll communicate with her cardiologist, discontinue Coumadin? ADDENDUM: AFTER FURTHER DISCUSSION, SHE AGREED TO GO TO THE HOSPITAL DECLINED AN AMBULANCE STATES SHE WILL CALL HER DAUGHTER  D/W Keisi ANN YORK ([employer]) WHO ACCEPTED THE PT I RECOMMEND DISCUSS COUMADIN THERAPY WITH HER CARDIOLOGIST DR Alanda Amass  Problem # 2:  OSTEOARTHRITIS (ICD-715.90) Today also c/o neck pain w/o radiation neck is FROM ?muscleskeletal issue? further w/u per hospital team  Her updated medication list for this problem includes:    Baby Aspirin 81 Mg Chew (Aspirin)  Complete Medication List: 1)  Baby Aspirin 81 Mg Chew (Aspirin) 2)  Nitroquick 0.3 Mg Subl (Nitroglycerin) .... As directed 3)  Metoprolol Tartrate 25 Mg Tabs (Metoprolol tartrate) .Marland Kitchen.. 1 by mouth three times a day 4)  Amlodipine Besylate 5 Mg Tabs (Amlodipine besylate) .... One daily 5)  Maxzide 75-50 Mg Tabs (Triamterene-hctz) .... 1/2 by mouth qd 6)  Benazepril Hcl 20 Mg Tabs (Benazepril hcl) .... 1/2 by mouth qam 7)  Lipitor 40 Mg Tabs (Atorvastatin calcium) .Marland Kitchen.. 1 by mouth at bedtime. 8)  Zegerid Otc 20-1100 Mg Caps (Omeprazole-sodium bicarbonate) .... As needed 9)  Alprazolam 0.5 Mg Tabs (Alprazolam) .Marland Kitchen.. 1 by mouth at bedtime 10)  Freestyle Lite Strp (Glucose blood) .... Checks blood sugar three times a day dx 250.00 11)  Freestyle Lancets Misc  (Lancets) .... Checks blood sugar three times a day dx 250.00 12)  Cal/d  13)  Coumadin 5 Mg Solr (Warfarin sodium) .... Per cardiology 14)  Meclizine Hcl 25 Mg Tabs (Meclizine hcl) .... Take 1/2 to 1 tablet every 6 hours as needed for vertigo 15)  Nexium 40 Mg Cpdr (Esomeprazole magnesium) .... Qd 16)  Flexeril 10 Mg Tabs (Cyclobenzaprine hcl) .... As needed  n  Orders Added: 1)  Est. Patient Level IV [16109]

## 2010-07-13 ENCOUNTER — Ambulatory Visit: Admit: 2010-07-13 | Payer: Self-pay | Admitting: Internal Medicine

## 2010-07-13 ENCOUNTER — Encounter: Payer: Self-pay | Admitting: Internal Medicine

## 2010-07-17 ENCOUNTER — Ambulatory Visit (INDEPENDENT_AMBULATORY_CARE_PROVIDER_SITE_OTHER): Payer: MEDICARE | Admitting: Internal Medicine

## 2010-07-17 ENCOUNTER — Encounter: Payer: Self-pay | Admitting: Internal Medicine

## 2010-07-17 DIAGNOSIS — E785 Hyperlipidemia, unspecified: Secondary | ICD-10-CM

## 2010-07-17 DIAGNOSIS — L910 Hypertrophic scar: Secondary | ICD-10-CM

## 2010-07-17 DIAGNOSIS — D649 Anemia, unspecified: Secondary | ICD-10-CM | POA: Insufficient documentation

## 2010-07-17 DIAGNOSIS — E119 Type 2 diabetes mellitus without complications: Secondary | ICD-10-CM

## 2010-07-17 DIAGNOSIS — R42 Dizziness and giddiness: Secondary | ICD-10-CM

## 2010-07-19 ENCOUNTER — Telehealth (INDEPENDENT_AMBULATORY_CARE_PROVIDER_SITE_OTHER): Payer: Self-pay | Admitting: *Deleted

## 2010-07-26 ENCOUNTER — Ambulatory Visit: Payer: MEDICARE | Admitting: Endocrinology

## 2010-07-26 NOTE — Progress Notes (Signed)
Summary: appt w/ Dr.Ellison.  ---- Converted from flag ---- ---- 07/17/2010 5:24 PM, Jose E. Paz MD wrote: needs office visit with Dr. Everardo All. Please arrange ------------------------------  Made appt for 07/26/10 at 11:15 am for pt. I spoke w/ pt she is aware of appt and repeated appt time and date to me.

## 2010-07-26 NOTE — Assessment & Plan Note (Signed)
Summary: F/U--- hosp 1/21/ CBS/PH   Vital Signs:  Patient profile:   77 year old female Height:      61.75 inches Weight:      174 pounds BMI:     32.20 Pulse rate:   82 / minute Pulse rhythm:   regular BP sitting:   134 / 84  (left arm) Cuff size:   regular  Vitals Entered By: Army Fossa CMA (July 17, 2010 10:34 AM) CC: Hospital f/u- fasting  Comments still having vertigo symptoms [facility] [address]    History of Present Illness: Hospital followup Chart reviewed  DATE OF ADMISSION:  06/27/2010   DATE OF DISCHARGE:  06/30/2010   was admitted with severe dizziness Neurology was consulted, they felt she had a peripheral problem.  relevant  x-rays and labs MRI, MRA of the brain to no acute changes and no large vessel disease MRA of the neck, showed 40% stenosis of the left internal carotid artery Cervical spine MRI showed mild stenosis  hemoglobin A1c 6.5 Total cholesterol 112, LDL 65, HDL 53 Creatinine 1.0, potassium 3.7. TSH decreased at 0.07 Hemoglobin initially was 14.2, repeated hemoglobin 10.5. Iron, ferritin, B12 were normal INR 2.8  06-30-10  change in meds: add iron, reglan d/c maxzide apparently good compliance   ROS Since she left the hospital, she is feeling better, less dizzy. Still no driving Saw physical therapy, unclear if she did it once or twice, is not planning to go back to them. Denies chest pain, shortness of breath is at baseline No nausea vomiting Also she requests a prescription for a topical steroid , she has had keloid in the chest  from her heart surgery     Current Medications (verified): 1)  Baby Aspirin 81 Mg  Chew (Aspirin) 2)  Nitroquick 0.3 Mg  Subl (Nitroglycerin) .... As Directed 3)  Metoprolol Tartrate 25 Mg  Tabs (Metoprolol Tartrate) .Marland Kitchen.. 1 By Mouth Three Times A Day 4)  Amlodipine Besylate 5 Mg  Tabs (Amlodipine Besylate) .... One Daily 5)  Benazepril Hcl 20 Mg  Tabs (Benazepril Hcl) .... 1/2 By Mouth Qam 6)  Lipitor  40 Mg Tabs (Atorvastatin Calcium) .Marland Kitchen.. 1 By Mouth At Bedtime. 7)  Zegerid Otc 20-1100 Mg Caps (Omeprazole-Sodium Bicarbonate) .... As Needed 8)  Alprazolam 0.5 Mg  Tabs (Alprazolam) .Marland Kitchen.. 1 By Mouth At Bedtime 9)  Freestyle Lite   Strp (Glucose Blood) .... Checks Blood Sugar Three Times A Day Dx 250.00 10)  Freestyle Lancets  Misc (Lancets) .... Checks Blood Sugar Three Times A Day Dx 250.00 11)  Coumadin 5 Mg Solr (Warfarin Sodium) .... Per Cardiology 12)  Meclizine Hcl 25 Mg Tabs (Meclizine Hcl) .... Take 1/2 To 1 Tablet Every 6 Hours As Needed For Vertigo 13)  Nexium 40 Mg Cpdr (Esomeprazole Magnesium) .... Qd 14)  Flexeril 10 Mg Tabs (Cyclobenzaprine Hcl) .... As Needed 15)  Fluocinonide 0.05 % Crea (Fluocinonide) .... Used As Directed. 16)  Ferrous Sulfate 325 (65 Fe) Mg Tbec (Ferrous Sulfate) .Marland Kitchen.. 1 By Mouth Daily 17)  Reglan 10 Mg Tabs (Metoclopramide Hcl) .Marland Kitchen.. 1 By Mouth Three Times A Day As Needed  Allergies (verified): 1)  ! Hydrocodone 2)  ! Ultram (Tramadol Hcl)  Past History:  Past Medical History: ASTHMA Diabetes mellitus, type II HTN Hyperlipidemia CAD, s/p CABG, s/p Ao Valve replacement ([facility]) UTI'S, RECURRENT   HYPERTENSION  ATRIAL FIBRILLATION, PAROXYSMAL--cards d/c coumadin 12-2008 d/t persistent NSR and frecuent falls ---restarted coumadin  ~ July 2011  HYPERTHYROIDISM  dry skin Osteoarthritis Osteopenia,per DEXA 12-09 Former heavy smoker keloid @ chest   Social History: Divorced x many years. lives by herself, has life alert not driving at present  she has one living daughter in [place] Tobacco-- quit  2004 apros, used to smoke  1.5 ppd  ETOH--no  Physical Exam  General:  alert and well-developed.  no apparent distress today Lungs:  normal respiratory effort, no intercostal retractions, no accessory muscle use, slight  decreased breath sounds Heart:  seems regular today  soft syst. murmur   Impression & Recommendations:  Problem #  1:  DIZZINESS (ICD-780.4) Status post admission for severe dizziness, improving, she was  rec  to continue with meclizine and also to take Reglan. Had physical therapy one or 2 times but does not desire to continue with it. Plan... continue present care Her updated medication list for this problem includes:    Meclizine Hcl 25 Mg Tabs (Meclizine hcl) .Marland Kitchen... Take 1/2 to 1 tablet every 6 hours as needed for vertigo  Problem # 2:  ANEMIA (ICD-285.9) at the hospital, her hemoglobin initially was 14.2, repeated hemoglobin 10.5. Iron, ferritin, B12 were normal Hg 13.0 today had a Cscope Dr Candelaria Stagers 2005, she was recommended to repeat one in 5 years, patient has declined in the past to have further screenings;  at this point, we'll discontinue iron, her hemoglobin is now back to normal and she did not have iron deficiency  Problem # 3:  KELOID (ICD-701.4) has a keloid scar in the chest after CABG We'll continue prescribing topical  steroids as needed  Problem # 4:  DIABETES MELLITUS, TYPE II (ICD-250.00) hemoglobin A1c 6.5 few days ago at the hospital   Her updated medication list for this problem includes:    Baby Aspirin 81 Mg Chew (Aspirin)    Benazepril Hcl 20 Mg Tabs (Benazepril hcl) .Marland Kitchen... 1/2 by mouth qam  Problem # 5:  HYPERLIPIDEMIA (ICD-272.4)  Total cholesterol 112, LDL 65, HDL 53.......a few days ago at the hospital. Well controlled  Her updated medication list for this problem includes:    Lipitor 40 Mg Tabs (Atorvastatin calcium) .Marland Kitchen... 1 by mouth at bedtime.  Problem # 6:  HYPERTHYROIDISM (ICD-242.90) see  previous entry Her TSH a few days ago was decreased at 0.07  dear for a followup with Dr. Everardo All, Will arrange Her updated medication list for this problem includes:    Metoprolol Tartrate 25 Mg Tabs (Metoprolol tartrate) .Marland Kitchen... 1 by mouth three times a day  Complete Medication List: 1)  Baby Aspirin 81 Mg Chew (Aspirin) 2)  Nitroquick 0.3 Mg Subl (Nitroglycerin) .... As  directed 3)  Metoprolol Tartrate 25 Mg Tabs (Metoprolol tartrate) .Marland Kitchen.. 1 by mouth three times a day 4)  Amlodipine Besylate 5 Mg Tabs (Amlodipine besylate) .... One daily 5)  Benazepril Hcl 20 Mg Tabs (Benazepril hcl) .... 1/2 by mouth qam 6)  Lipitor 40 Mg Tabs (Atorvastatin calcium) .Marland Kitchen.. 1 by mouth at bedtime. 7)  Zegerid Otc 20-1100 Mg Caps (Omeprazole-sodium bicarbonate) .... As needed 8)  Alprazolam 0.5 Mg Tabs (Alprazolam) .Marland Kitchen.. 1 by mouth at bedtime 9)  Freestyle Lite Strp (Glucose blood) .... Checks blood sugar three times a day dx 250.00 10)  Freestyle Lancets Misc (Lancets) .... Checks blood sugar three times a day dx 250.00 11)  Coumadin 5 Mg Solr (Warfarin sodium) .... Per cardiology 12)  Meclizine Hcl 25 Mg Tabs (Meclizine hcl) .... Take 1/2 to 1 tablet every 6 hours as needed for  vertigo 13)  Nexium 40 Mg Cpdr (Esomeprazole magnesium) .... Qd 14)  Flexeril 10 Mg Tabs (Cyclobenzaprine hcl) .... As needed 15)  Fluocinonide 0.05 % Crea (Fluocinonide) .... Used as directed. 16)  Reglan 10 Mg Tabs (Metoclopramide hcl) .Marland Kitchen.. 1 by mouth three times a day as needed  Patient Instructions: 1)  discontinue Ferrous tablets (iron)  2)  for  dizziness take meclizine and Reglan (. Metoclopramide) 3)  call if the dizziness gets worse 4)  Please schedule a follow-up appointment in 3 months .    Orders Added: 1)  Est. Patient Level IV [03474]

## 2010-07-31 ENCOUNTER — Encounter: Payer: Self-pay | Admitting: Internal Medicine

## 2010-08-01 ENCOUNTER — Ambulatory Visit: Payer: MEDICARE | Admitting: Endocrinology

## 2010-08-03 ENCOUNTER — Ambulatory Visit (INDEPENDENT_AMBULATORY_CARE_PROVIDER_SITE_OTHER): Payer: MEDICARE | Admitting: Endocrinology

## 2010-08-03 ENCOUNTER — Other Ambulatory Visit: Payer: MEDICARE

## 2010-08-03 ENCOUNTER — Encounter: Payer: Self-pay | Admitting: Endocrinology

## 2010-08-03 ENCOUNTER — Other Ambulatory Visit: Payer: Self-pay | Admitting: Endocrinology

## 2010-08-03 DIAGNOSIS — E042 Nontoxic multinodular goiter: Secondary | ICD-10-CM | POA: Insufficient documentation

## 2010-08-03 DIAGNOSIS — E0590 Thyrotoxicosis, unspecified without thyrotoxic crisis or storm: Secondary | ICD-10-CM

## 2010-08-07 NOTE — Assessment & Plan Note (Signed)
Summary: f/u appt   Vital Signs:  Patient profile:   77 year old female Menstrual status:  postmenopausal Height:      61.75 inches (156.85 cm) Weight:      175.75 pounds (79.89 kg) BMI:     32.52 O2 Sat:      97 % on Room air Temp:     98.7 degrees F (37.06 degrees C) oral Pulse rate:   57 / minute BP sitting:   150 / 74  (left arm) Cuff size:   regular  Vitals Entered By: Brenton Grills CMA Duncan Dull) (August 03, 2010 11:19 AM)  O2 Flow:  Room air CC: Follow up on thyroid/aj Is Patient Diabetic? Yes     Menstrual Status postmenopausal   CC:  Follow up on thyroid/aj.  History of Present Illness: pt is here is to f/o hyperthyroidism due to a multinodular goiter.  she is off the tapazole.  pt states she feels well in general, excpet for intermittent palpitations.  Current Medications (verified): 1)  Baby Aspirin 81 Mg  Chew (Aspirin) 2)  Nitroquick 0.3 Mg  Subl (Nitroglycerin) .... As Directed 3)  Metoprolol Tartrate 25 Mg  Tabs (Metoprolol Tartrate) .Marland Kitchen.. 1 By Mouth Three Times A Day 4)  Amlodipine Besylate 5 Mg  Tabs (Amlodipine Besylate) .... One Daily 5)  Benazepril Hcl 20 Mg  Tabs (Benazepril Hcl) .... 1/2 By Mouth Qam 6)  Lipitor 40 Mg Tabs (Atorvastatin Calcium) .Marland Kitchen.. 1 By Mouth At Bedtime. 7)  Zegerid Otc 20-1100 Mg Caps (Omeprazole-Sodium Bicarbonate) .... As Needed 8)  Alprazolam 0.5 Mg  Tabs (Alprazolam) .Marland Kitchen.. 1 By Mouth At Bedtime 9)  Freestyle Lite   Strp (Glucose Blood) .... Checks Blood Sugar Three Times A Day Dx 250.00 10)  Freestyle Lancets  Misc (Lancets) .... Checks Blood Sugar Three Times A Day Dx 250.00 11)  Coumadin 5 Mg Solr (Warfarin Sodium) .... Per Cardiology 12)  Meclizine Hcl 25 Mg Tabs (Meclizine Hcl) .... Take 1/2 To 1 Tablet Every 6 Hours As Needed For Vertigo 13)  Nexium 40 Mg Cpdr (Esomeprazole Magnesium) .... Qd 14)  Flexeril 10 Mg Tabs (Cyclobenzaprine Hcl) .... As Needed 15)  Fluocinonide 0.05 % Crea (Fluocinonide) .... Used As  Directed. 16)  Reglan 10 Mg Tabs (Metoclopramide Hcl) .Marland Kitchen.. 1 By Mouth Three Times A Day As Needed  Allergies (verified): 1)  ! Hydrocodone 2)  ! Ultram (Tramadol Hcl)  Past History:  Past Medical History: Last updated: 07/17/2010 ASTHMA Diabetes mellitus, type II HTN Hyperlipidemia CAD, s/p CABG, s/p Ao Valve replacement ([facility]) UTI'S, RECURRENT   HYPERTENSION  ATRIAL FIBRILLATION, PAROXYSMAL--cards d/c coumadin 12-2008 d/t persistent NSR and frecuent falls ---restarted coumadin  ~ July 2011 HYPERTHYROIDISM  dry skin Osteoarthritis Osteopenia,per DEXA 12-09 Former heavy smoker keloid @ chest   Review of Systems       The patient complains of weight gain.    Physical Exam  General:  elderly, frail, no distress  Neck:  i think i can feel a large multinodular goiter, but i am not certain.     Impression & Recommendations:  Problem # 1:  GOITER, MULTINODULAR (ICD-241.1) Assessment Unchanged  Problem # 2:  HYPERTHYROIDISM (ICD-242.90) we need to wait for this to recur off the tapazole before doing the scanning and i-131 rx  Other Orders: TLB-TSH (Thyroid Stimulating Hormone) (84443-TSH) TLB-T4 (Thyrox), Free [phone]) Est. Patient Level III (40981)  Patient Instructions: 1)  stay-off the methimazole. 2)  blood tests are being ordered for you  today.  please call [phone] to hear your test results. 3)  based on the results, i hop to order a thyroid "scan" (a special but easy and painless type of thyroid x-ray).  this will tell if the radioactive iodine pill if right for you.   4)  (update: i left message on phone-tree:  you are not yet ready for scanning and i-131 rx.  stay-off tapazole.  ret 1 month)   Orders Added: 1)  TLB-TSH (Thyroid Stimulating Hormone) [84443-TSH] 2)  TLB-T4 (Thyrox), Free [73220-UR4Y] 3)  Est. Patient Level III [70623]      Preventive Care Screening  Mammogram:    Date:  12/08/2009    Results:  normal    Colonoscopy:    Date:  06/10/2005    Results:  done

## 2010-08-08 ENCOUNTER — Encounter: Payer: Self-pay | Admitting: Internal Medicine

## 2010-08-10 ENCOUNTER — Telehealth (INDEPENDENT_AMBULATORY_CARE_PROVIDER_SITE_OTHER): Payer: Self-pay | Admitting: *Deleted

## 2010-08-13 NOTE — Consult Note (Signed)
Rebecca Skinner, Rebecca Skinner                   ACCOUNT NO.:  [id number]  MEDICAL RECORD NO.:  [id number]          PATIENT TYPE:  INP  LOCATION:  3728                         FACILITY:  [facility]  PHYSICIAN:  Joycelyn Schmid, MD   DATE OF BIRTH:  06-01-1923  DATE OF CONSULTATION:  06/29/2010 DATE OF DISCHARGE:                                CONSULTATION   REASON FOR CONSULTATION:  Vertiginous sensation.  HISTORY OF PRESENT ILLNESS:  This is a pleasant African American 77 year old female with past medical history of hypertension, diabetes, hypercholesterolemia, paroxysmal atrial fibrillation, GERD, hyperthyroidism, CAD status post CABG in 1998, asthma, osteoarthritis, and history of vertigo on and off for 4-5 years.  The patient was brought to [facility] secondary to having vertiginous sensation which she states started approximately 8 days ago.  However, on further discussion with the patient she actually states she has been having this vertiginous sensation since November.  She states she is actually gone to [facility] on [address] in November and had vestibular rehab at that time.  She felt like the vestibular rehab was of no benefit.  She also states during that time she has been on meclizine which she also feels has been of no benefit.  On this occasion, she states approximately 8 days ago, she was looking out at the window, she turned her head to the right and noted that the window followed her to the right.  Since that date, she has noted significant amount of vertiginous sensation every time she turns her head from mid position to the right. She denies any significant nausea, vomiting with this episode but states in the past she has had significant nausea and vomiting.  While she has been in the hospital, she has been able to get up and ambulate with the use of her cane without any difficulty and states that the vertiginous sensation was worse when lying down than standing  up.  During consultation, the patient is holding her head to the left and guarding any movement to the right.  When asked to turn her head to the right, she barely moves her head from mid position to the right when she states she has this sensation.  PAST MEDICAL HISTORY: 1. Hypertension. 2. Hypercholesterolemia. 3. Diabetes. 4. Paroxysmal atrial fibrillation. 5. GERD. 6. Hyperthyroidism. 7. CAD status post CABG in 1998. 8. Asthma. 9. Osteoarthritis. 10.Vertigo for approximately 4-5 years.  MEDICATIONS: 1. Flexeril. 2. Nexium. 3. Meclizine. 4. Coumadin. 5. Alprazolam. 6. Zegerid. 7. Lipitor. 8. Benazepril. 9. Maxzide. 10.Amlodipine. 11.Metoprolol. 12.Aspirin 81 mg.  ALLERGIES: 1. HYDROCODONE. 2. ULTRAM.  SOCIAL HISTORY:  The patient does not smoke, drink, or do illicit drugs. She is divorced.  She lives in [place].  She is fairly active and continues to drive at this time.  REVIEW OF SYSTEMS:  Generalized weakness, vertigo, and shortness of breath.  PHYSICAL EXAMINATION:  VITAL SIGNS:  Blood pressure is 130/74, pulse 55, respirations 16, and temperature 98.1. GENERAL:  The patient is alert and oriented x3, carries out 2 and 3-step commands. HEENT:  Pupils are equal, round, and reactive to  light and accommodating.  Pupils are reactive from 2 mm to 1 mm.  She is conjugate.  Extraocular movements are intact.  Visual fields are grossly intact.  No nystagmus is noted when the patient is turning her head to the right.  Face symmetrical.  Tongue is midline.  Uvula is midline. NEUROLOGIC:  The patient has no dysarthria, aphasia, or slurred speech. Face sensation is grossly intact to pinprick, light touch, and vibration bilaterally.  Shoulder shrug and head turn is within normal limits. Coordination:  Finger-to-nose, heel-to-shin, and fine motor movements are within normal limits. Gait:  The patient is able to ambulate with the use of a cane.  She does shuffle her  feet and she is significantly short of breath when standing but no nystagmus or ataxia is noted. Motor:  The patient's bilateral upper extremities have 5/5 strength. Grips are equal bilaterally.  Lower extremity showed 5/5 strength. There might be a slight weakness in her left knee extension secondary to total knee replacement. Deep tendon reflexes:  1+ throughout the upper extremities, 1+ on the right patella, zero on the left patella, zero on the right Achilles, and 1 on the left Achilles.  She has downgoing toes bilaterally. Drift:  The patient has no drift in the upper or lower extremities. Sensation:  Full to pinprick, light touch, and vibration. PULMONARY:  Clear to auscultation bilaterally. CARDIOVASCULAR:  S1-S2 is audible.  She is in atrial fibrillation at this time. NECK:  Negative for bruits and supple.  LABORATORY DATA:  Sodium is 141, potassium 3.7, chloride 108, CO2 26, BUN 24, creatinine 1.02, and glucose 108.  White blood cell count 5.4, platelets 168, hemoglobin is 11.2, and hematocrit 33.7.  INR is 2.5. Triglycerides 71, cholesterol 112, LDL 65, and HDL 33.  HbA1c is 6.5.  IMAGING:  MRA of the head shows atherosclerotic disease in the cavernous carotids on the right with moderate-to-severe stenosis in the right and moderate stenosis of the left.  MRA of the neck shows mild atherosclerotic disease of the carotid bifurcation on the right with 40% stenosis on the left ICA and no ICA stenosis on the right.  MRI of the brain is negative for any mass, bleed and positive for white matter disease and atrophy.  MRI of the neck is negative for any cord impingement.  ASSESSMENT:  This is a pleasant 77 year old African American female with approximately 8 days of vertiginous sensation with turning her head to the right.  Most likely, this represents a peripheral vestibulopathy. At this time, recommendations would be to continue meclizine if helpful and initiate vestibular  rehab while she is inpatient.  No further recommendations at this time.  In consultation of this patient, all the patient's labs, medications, and diagnostic tests were reviewed.  Greater than 1 hour was spent with the patient with both consultation and physical exam.     Felicie Morn, PA-C   ______________________________ Joycelyn Schmid, MD    DS/MEDQ  D:  06/29/2010  T:  06/30/2010  Job:  [id number]  Electronically Signed by Felicie Morn PA-C on 07/06/2010 04:13:12 PM Electronically Signed by Joycelyn Schmid  on 08/13/2010 12:19:02 PM

## 2010-08-16 NOTE — Progress Notes (Signed)
Summary: status of form from [employer]  Phone Note From Other Clinic   Caller: [employer] = [phone] Summary of Call: Rep from [employer] called to check on status of their paperwork faxed to [place] third week of February---patient cant get supplies until they get our completed form Initial call taken by: Jerolyn Shin,  August 10, 2010 1:02 PM  Follow-up for Phone Call        I spoke w/ company and informed them that per the doc pt needs an appt to get this, pt is aware and will make appt when she can get a ride. Army Fossa CMA  August 10, 2010 1:25 PM

## 2010-08-17 ENCOUNTER — Encounter: Payer: Self-pay | Admitting: Internal Medicine

## 2010-08-20 LAB — URINALYSIS, ROUTINE W REFLEX MICROSCOPIC
Glucose, UA: NEGATIVE mg/dL
Hgb urine dipstick: NEGATIVE
Ketones, ur: NEGATIVE mg/dL
Protein, ur: NEGATIVE mg/dL

## 2010-08-20 LAB — POCT I-STAT, CHEM 8
Chloride: 106 mEq/L (ref 96–112)
Creatinine, Ser: 1.1 mg/dL (ref 0.4–1.2)
Glucose, Bld: 116 mg/dL — ABNORMAL HIGH (ref 70–99)
Potassium: 5.5 mEq/L — ABNORMAL HIGH (ref 3.5–5.1)

## 2010-08-21 NOTE — Medication Information (Signed)
Summary: Order for Diabetic Testing Supplies  Order for Diabetic Testing Supplies   Imported By: Maryln Gottron 08/15/2010 10:28:05  _____________________________________________________________________  External Attachment:    Type:   Image     Comment:   External Document

## 2010-08-28 NOTE — Letter (Signed)
Summary: Diabetic Testing Supplies  Diabetic Testing Supplies   Imported By: Kassie Mends 08/23/2010 08:59:25  _____________________________________________________________________  External Attachment:    Type:   Image     Comment:   External Document

## 2010-09-09 ENCOUNTER — Other Ambulatory Visit: Payer: Self-pay | Admitting: Internal Medicine

## 2010-09-15 ENCOUNTER — Other Ambulatory Visit: Payer: Self-pay | Admitting: Internal Medicine

## 2010-10-08 ENCOUNTER — Other Ambulatory Visit: Payer: Self-pay | Admitting: Internal Medicine

## 2010-10-09 NOTE — Telephone Encounter (Signed)
Ok RF cream 1 and 1 RF. Don't RF tapazole, they need to ask endocrinology, Dr Everardo All

## 2010-10-11 ENCOUNTER — Encounter: Payer: Self-pay | Admitting: Internal Medicine

## 2010-10-15 ENCOUNTER — Ambulatory Visit (INDEPENDENT_AMBULATORY_CARE_PROVIDER_SITE_OTHER): Payer: MEDICARE | Admitting: Internal Medicine

## 2010-10-15 ENCOUNTER — Encounter: Payer: Self-pay | Admitting: Internal Medicine

## 2010-10-15 DIAGNOSIS — I4891 Unspecified atrial fibrillation: Secondary | ICD-10-CM

## 2010-10-15 DIAGNOSIS — E119 Type 2 diabetes mellitus without complications: Secondary | ICD-10-CM

## 2010-10-15 DIAGNOSIS — E785 Hyperlipidemia, unspecified: Secondary | ICD-10-CM

## 2010-10-15 DIAGNOSIS — I10 Essential (primary) hypertension: Secondary | ICD-10-CM

## 2010-10-15 DIAGNOSIS — I2510 Atherosclerotic heart disease of native coronary artery without angina pectoris: Secondary | ICD-10-CM

## 2010-10-15 NOTE — Assessment & Plan Note (Signed)
H/o CAD, f/u at [facility]. Reports CP-SOB which are chronic but ?slt more frecuent Plan: NTG prn ER if severe sx, refer to cards

## 2010-10-15 NOTE — Progress Notes (Signed)
  Subjective:    Patient ID: Rebecca Skinner, female    DOB: [birth year], 77 y.o.   MRN: [id number]  HPI Routine office visit, doing okay.  Past Medical History  Diagnosis Date  . Asthma   . Diabetes mellitus   . Hypertension   . Hyperlipidemia   . CAD (coronary artery disease)     s/p CABG s/p AO valve replacement ([place])  . Recurrent UTI   . Paroxysmal atrial fibrillation     cards d/c coumadin 12/2008 d/t persisten NSR and frequent falls- restarted coumadin july 2011  . Hyperthyroidism   . Dry skin   . Osteoarthritis   . Osteopenia     per dexa 12/09  . Keloid     @ chest   Past Surgical History  Procedure Date  . Abdominal hysterectomy   . Total knee arthroplasty 1999  . Oophorectomy   . Cagb 1998  . Cesarean section     x 2  . Hemorrhoid surgery     Review of Systems Complaining of a slightly worse edema in both ankles for the past few days. Getting better this week. Past on and off chest pain for a while, not getting more often. Shortness of breath slightly more than baseline for a while. She has nitroglycerin. No nausea, vomiting, diarrhea. No bone pain, appetite is very good.    Objective:   Physical Exam Alert, oriented x3. Lungs are clear to auscultation bilaterally Heart and vascular, heart sounds irregularly irregular today. Soft systolic murmur. Legs with trace bilateral ankle edema.        Assessment & Plan:

## 2010-10-15 NOTE — Assessment & Plan Note (Signed)
Diet control , admits to not eating very healthy but plans to do better

## 2010-10-15 NOTE — Assessment & Plan Note (Signed)
Well controlled 

## 2010-10-15 NOTE — Assessment & Plan Note (Signed)
On coumadin 

## 2010-10-18 ENCOUNTER — Other Ambulatory Visit: Payer: Self-pay | Admitting: *Deleted

## 2010-10-18 DIAGNOSIS — E119 Type 2 diabetes mellitus without complications: Secondary | ICD-10-CM

## 2010-10-19 ENCOUNTER — Other Ambulatory Visit (INDEPENDENT_AMBULATORY_CARE_PROVIDER_SITE_OTHER): Payer: MEDICARE

## 2010-10-19 DIAGNOSIS — E119 Type 2 diabetes mellitus without complications: Secondary | ICD-10-CM

## 2010-10-19 DIAGNOSIS — I10 Essential (primary) hypertension: Secondary | ICD-10-CM

## 2010-10-19 LAB — BASIC METABOLIC PANEL
Chloride: 103 mEq/L (ref 96–112)
Potassium: 3.8 mEq/L (ref 3.5–5.1)
Sodium: 140 mEq/L (ref 135–145)

## 2010-10-19 LAB — HEMOGLOBIN A1C: Hgb A1c MFr Bld: 6.7 % — ABNORMAL HIGH (ref 4.6–6.5)

## 2010-11-06 ENCOUNTER — Other Ambulatory Visit: Payer: Self-pay | Admitting: Internal Medicine

## 2010-11-06 NOTE — Telephone Encounter (Signed)
Correct sig.:  1 at bedtime as needed, #30, 1 refill

## 2010-11-09 ENCOUNTER — Telehealth: Payer: Self-pay | Admitting: Internal Medicine

## 2010-11-09 NOTE — Telephone Encounter (Signed)
Message left for patient to return my call.  

## 2010-11-09 NOTE — Telephone Encounter (Signed)
I spoke w/ pt she is aware, she saw Cardiology yesterday.

## 2010-11-09 NOTE — Telephone Encounter (Signed)
Advise patient, BMP and A1c from few  weeks ago were okay. She was referred to cardiology, did  she ever go?

## 2010-11-18 ENCOUNTER — Other Ambulatory Visit: Payer: Self-pay | Admitting: Internal Medicine

## 2011-01-11 ENCOUNTER — Other Ambulatory Visit: Payer: Self-pay | Admitting: Internal Medicine

## 2011-01-14 ENCOUNTER — Other Ambulatory Visit: Payer: Self-pay | Admitting: Internal Medicine

## 2011-01-16 NOTE — Telephone Encounter (Signed)
We just did 30 few days ago

## 2011-01-28 ENCOUNTER — Other Ambulatory Visit: Payer: Self-pay | Admitting: Internal Medicine

## 2011-01-29 NOTE — Telephone Encounter (Signed)
Ok 30 tab   and 1 RF on each med

## 2011-01-31 ENCOUNTER — Other Ambulatory Visit: Payer: Self-pay | Admitting: Internal Medicine

## 2011-01-31 ENCOUNTER — Encounter: Payer: Self-pay | Admitting: Internal Medicine

## 2011-01-31 NOTE — Telephone Encounter (Signed)
Rx Done . 

## 2011-02-05 ENCOUNTER — Other Ambulatory Visit: Payer: Self-pay | Admitting: Internal Medicine

## 2011-02-05 NOTE — Telephone Encounter (Signed)
Rx Done . 

## 2011-02-18 ENCOUNTER — Ambulatory Visit (INDEPENDENT_AMBULATORY_CARE_PROVIDER_SITE_OTHER): Payer: Medicare Other | Admitting: Internal Medicine

## 2011-02-18 ENCOUNTER — Encounter: Payer: Self-pay | Admitting: Internal Medicine

## 2011-02-18 DIAGNOSIS — R51 Headache: Secondary | ICD-10-CM

## 2011-02-18 DIAGNOSIS — R42 Dizziness and giddiness: Secondary | ICD-10-CM

## 2011-02-18 DIAGNOSIS — I10 Essential (primary) hypertension: Secondary | ICD-10-CM

## 2011-02-18 DIAGNOSIS — E119 Type 2 diabetes mellitus without complications: Secondary | ICD-10-CM

## 2011-02-18 DIAGNOSIS — R519 Headache, unspecified: Secondary | ICD-10-CM | POA: Insufficient documentation

## 2011-02-18 DIAGNOSIS — E785 Hyperlipidemia, unspecified: Secondary | ICD-10-CM

## 2011-02-18 DIAGNOSIS — I2510 Atherosclerotic heart disease of native coronary artery without angina pectoris: Secondary | ICD-10-CM

## 2011-02-18 NOTE — Assessment & Plan Note (Addendum)
Not taking lipitor, reason?? "It was d/c a while back"

## 2011-02-18 NOTE — Assessment & Plan Note (Signed)
Chronic chest pain and shortness of breath, saw cardiology 10/2010, was thought to be stable. Recommend patient to call if chest pain pattern changes; ER if symptoms severe

## 2011-02-18 NOTE — Assessment & Plan Note (Signed)
Well-controlled,  check a BMP 

## 2011-02-18 NOTE — Progress Notes (Signed)
  Subjective:    Patient ID: Rebecca Skinner, female    DOB: September 28, 1922, 77 y.o.   MRN: [id number]  HPI Routine office visit, no new complaints except for headache. Headaches started a few weeks ago, this is  unusual symptom for her, no assoc nausea- vomiting, in the scale from 0-10 is a 6, may last the whole day , may decrease with Tylenol. Located in the forehead. Has also noted blurred vision for the last 2-3 weeks after she changed glasses  Diabetes--blood sugars within normal "if my diet is good" Hypertension good medication compliance, reports her normal ambulatory blood pressures. Medication compliance--she has a list of medications with her, she goes by it, we corrected our list base on that (not on lipitor, on benazepril 20mg , etc )   Past Medical History  Diagnosis Date  . Asthma   . Diabetes mellitus   . Hypertension   . Hyperlipidemia   . CAD (coronary artery disease)     s/p CABG s/p AO valve replacement ([place])  . Recurrent UTI   . Paroxysmal atrial fibrillation     cards d/c coumadin 12/2008 d/t persisten NSR and frequent falls- restarted coumadin july 2011  . Hyperthyroidism   . Dry skin   . Osteoarthritis   . Osteopenia     per dexa 12/09  . Keloid     @ chest  . Dizziness     Chronic, admiet 07-2010,saw neuro, thought to be a peripheral issue      Review of Systems Denies any recent fever or weight loss. No myalgias per se. No sore throat or sinus pain or congestion, some runny nose. She continue with chronic chest pain and shortness of breath, reports about 2 episodes of chest pain a week, they quickly decreased with a single dose of nitroglycerin. Ongoing problems with dizziness and balance.    Objective:   Physical Exam  Constitutional: She is oriented to person, place, and time. She appears well-developed and well-nourished.  HENT:  Head: Normocephalic and atraumatic.  Nose: Nose normal.       Nontender to palpation of the sinus area  Eyes: EOM  are normal.       Cataracts present bilaterally  Neck: Normal range of motion. Neck supple.  Cardiovascular:  No murmur heard. Pulmonary/Chest: Effort normal and breath sounds normal. No respiratory distress. She has no wheezes. She has no rales.  Musculoskeletal: She exhibits no edema.  Neurological: She is alert and oriented to person, place, and time.       Speech is fluent, gait,: uses a walker, motor symetric  Psychiatric: She has a normal mood and affect. Her behavior is normal. Judgment and thought content normal.          Assessment & Plan:

## 2011-02-18 NOTE — Assessment & Plan Note (Signed)
Chronic, admitted  07-2010,saw neuro, thought to be a peripheral issue  no change

## 2011-02-18 NOTE — Assessment & Plan Note (Signed)
Developed a headache a few weeks ago, no recent head injury, no fever or weight loss, the patient is on Coumadin but there is no nuchal rigidity or nuchal pain. No evidence of sinusitis Plan: sed rate, asked to call within 1 week, if no better will need further evaluation (CT, neuro?)

## 2011-02-18 NOTE — Assessment & Plan Note (Signed)
Due for labs

## 2011-02-20 ENCOUNTER — Other Ambulatory Visit: Payer: Self-pay | Admitting: *Deleted

## 2011-02-20 DIAGNOSIS — I10 Essential (primary) hypertension: Secondary | ICD-10-CM

## 2011-02-20 DIAGNOSIS — R51 Headache: Secondary | ICD-10-CM

## 2011-02-20 DIAGNOSIS — E785 Hyperlipidemia, unspecified: Secondary | ICD-10-CM

## 2011-02-20 DIAGNOSIS — R42 Dizziness and giddiness: Secondary | ICD-10-CM

## 2011-02-20 DIAGNOSIS — E119 Type 2 diabetes mellitus without complications: Secondary | ICD-10-CM

## 2011-02-21 ENCOUNTER — Other Ambulatory Visit (INDEPENDENT_AMBULATORY_CARE_PROVIDER_SITE_OTHER): Payer: Medicare Other

## 2011-02-21 DIAGNOSIS — E785 Hyperlipidemia, unspecified: Secondary | ICD-10-CM

## 2011-02-21 DIAGNOSIS — R42 Dizziness and giddiness: Secondary | ICD-10-CM

## 2011-02-21 DIAGNOSIS — I10 Essential (primary) hypertension: Secondary | ICD-10-CM

## 2011-02-21 DIAGNOSIS — E119 Type 2 diabetes mellitus without complications: Secondary | ICD-10-CM

## 2011-02-21 DIAGNOSIS — R51 Headache: Secondary | ICD-10-CM

## 2011-02-21 LAB — CBC WITH DIFFERENTIAL/PLATELET
Basophils Absolute: 0 10*3/uL (ref 0.0–0.1)
Basophils Relative: 0.8 % (ref 0.0–3.0)
Eosinophils Absolute: 0.2 10*3/uL (ref 0.0–0.7)
Lymphocytes Relative: 29.2 % (ref 12.0–46.0)
MCHC: 32.1 g/dL (ref 30.0–36.0)
MCV: 97.5 fl (ref 78.0–100.0)
Monocytes Absolute: 0.5 10*3/uL (ref 0.1–1.0)
Neutrophils Relative %: 55 % (ref 43.0–77.0)
Platelets: 168 10*3/uL (ref 150.0–400.0)
RDW: 14 % (ref 11.5–14.6)

## 2011-02-21 LAB — BASIC METABOLIC PANEL
BUN: 13 mg/dL (ref 6–23)
CO2: 31 mEq/L (ref 19–32)
Calcium: 8.7 mg/dL (ref 8.4–10.5)
Creatinine, Ser: 0.9 mg/dL (ref 0.4–1.2)
GFR: 81.53 mL/min (ref >60.00)
Glucose, Bld: 126 mg/dL — ABNORMAL HIGH (ref 70–99)
Sodium: 139 mEq/L (ref 135–145)

## 2011-02-21 NOTE — Progress Notes (Signed)
Labs only

## 2011-02-25 ENCOUNTER — Telehealth: Payer: Self-pay

## 2011-02-25 MED ORDER — POTASSIUM CHLORIDE 10 MEQ PO TBCR: 10.0000 meq | EXTENDED_RELEASE_TABLET | Freq: Every day | ORAL | Status: DC

## 2011-02-25 NOTE — Telephone Encounter (Signed)
Message copied by Beverely Low on Mon Feb 25, 2011  9:22 AM ------      Message from: Wanda Plump      Created: Sat Feb 23, 2011  3:08 PM       Advise patient      Diabetes well-controlled      Potassium is slightly low, call KCl 10 mEq one by mouth daily for 10 days, then stop.      She also had a headache, if she is not getting better, let me know

## 2011-02-25 NOTE — Telephone Encounter (Signed)
Pt aware and Rx sent to pharmacy. Pt notes that she no longer has a headache

## 2011-03-06 ENCOUNTER — Other Ambulatory Visit: Payer: Self-pay | Admitting: Internal Medicine

## 2011-03-06 ENCOUNTER — Telehealth: Payer: Self-pay | Admitting: Internal Medicine

## 2011-03-06 NOTE — Telephone Encounter (Signed)
Klor-Con request; was prescribed on 02/25/2011 for #10 - take 10 days and stop. Patient is not having headaches and would like to know if she needs to continue this medication.?

## 2011-03-06 NOTE — Telephone Encounter (Signed)
See phone note from 02/25/2011: patient sttaes she no longer has a H/A.

## 2011-03-06 NOTE — Telephone Encounter (Signed)
Please check on the patient, was seen with a headache, is she better?

## 2011-03-12 NOTE — Telephone Encounter (Signed)
Tell patient: She was supposed to take potassium temporarily, we can D/C potassium from list

## 2011-03-13 NOTE — Telephone Encounter (Signed)
D/C PER MD/PATIENT INFORMED.

## 2011-04-08 ENCOUNTER — Other Ambulatory Visit: Payer: Self-pay | Admitting: Internal Medicine

## 2011-04-08 NOTE — Telephone Encounter (Signed)
Last OV 02/18/11. Last filled 03/05/11

## 2011-04-10 NOTE — Telephone Encounter (Signed)
Ok 30, 2 RF 

## 2011-07-11 ENCOUNTER — Other Ambulatory Visit: Payer: Self-pay | Admitting: Internal Medicine

## 2011-07-12 NOTE — Telephone Encounter (Signed)
Refill request

## 2011-08-11 ENCOUNTER — Other Ambulatory Visit: Payer: Self-pay | Admitting: Internal Medicine

## 2011-08-12 NOTE — Telephone Encounter (Signed)
Refill done.  

## 2011-08-20 ENCOUNTER — Encounter: Payer: Self-pay | Admitting: Internal Medicine

## 2011-08-20 ENCOUNTER — Ambulatory Visit (INDEPENDENT_AMBULATORY_CARE_PROVIDER_SITE_OTHER): Payer: Medicare Other | Admitting: Internal Medicine

## 2011-08-20 VITALS — BP 138/90 | HR 60 | Temp 98.2°F | Ht 62.0 in | Wt 170.0 lb

## 2011-08-20 DIAGNOSIS — K3189 Other diseases of stomach and duodenum: Secondary | ICD-10-CM

## 2011-08-20 DIAGNOSIS — I2510 Atherosclerotic heart disease of native coronary artery without angina pectoris: Secondary | ICD-10-CM

## 2011-08-20 DIAGNOSIS — E785 Hyperlipidemia, unspecified: Secondary | ICD-10-CM

## 2011-08-20 DIAGNOSIS — M899 Disorder of bone, unspecified: Secondary | ICD-10-CM

## 2011-08-20 DIAGNOSIS — M949 Disorder of cartilage, unspecified: Secondary | ICD-10-CM

## 2011-08-20 DIAGNOSIS — J45909 Unspecified asthma, uncomplicated: Secondary | ICD-10-CM

## 2011-08-20 DIAGNOSIS — I4891 Unspecified atrial fibrillation: Secondary | ICD-10-CM

## 2011-08-20 DIAGNOSIS — E119 Type 2 diabetes mellitus without complications: Secondary | ICD-10-CM

## 2011-08-20 DIAGNOSIS — E0590 Thyrotoxicosis, unspecified without thyrotoxic crisis or storm: Secondary | ICD-10-CM

## 2011-08-20 DIAGNOSIS — I10 Essential (primary) hypertension: Secondary | ICD-10-CM

## 2011-08-20 MED ORDER — ALBUTEROL SULFATE HFA 108 (90 BASE) MCG/ACT IN AERS: 2.0000 | INHALATION_SPRAY | Freq: Four times a day (QID) | RESPIRATORY_TRACT | Status: DC | PRN

## 2011-08-20 MED ORDER — CYCLOBENZAPRINE HCL 10 MG PO TABS: ORAL_TABLET | ORAL | Status: DC

## 2011-08-20 MED ORDER — ATORVASTATIN CALCIUM 40 MG PO TABS: ORAL_TABLET | ORAL | Status: DC

## 2011-08-20 NOTE — Assessment & Plan Note (Addendum)
Last dexa 2009, discuss on RTC

## 2011-08-20 NOTE — Assessment & Plan Note (Signed)
Recommend to change Nexium from once a day to prn

## 2011-08-20 NOTE — Assessment & Plan Note (Signed)
Hyperlipidemia, good medication compliance, due for labs.

## 2011-08-20 NOTE — Assessment & Plan Note (Signed)
  Atrial fibrillation, on Coumadin. She has a history of frequent falls, last fall 3 months ago. Fall prevention discussed. Coumadin is managed by cardiology, I recommend her to discuss with cardiology need of ongoing Coumadin use.

## 2011-08-20 NOTE — Progress Notes (Signed)
  Subjective:    Patient ID: Rebecca Skinner, female    DOB: 1922-06-27, 78 y.o.   MRN: [id number]  HPI Routine office visit High cholesterol, good medication compliance, no apparent side effects. Hypertension, good medication compliance, BP today slightly elevated, ambulatory blood pressures "normal" GERD, symptoms well-controlled on Nexium daily. Diabetes, on diet control, due for labs  Past Medical History: Diabetes mellitus, type II HTN Hyperlipidemia Hypothyroidism CV ---CAD, s/p CABG, s/p Ao Valve replacement ([place] [facility]) ---ATRIAL FIBRILLATION, PAROXYSMAL--cards d/c coumadin 12-2008 d/t persistent NSR and frecuent falls ---restarted coumadin ~ July 2011 Asthma Recurrent UTIs dry skin Osteoarthritis Osteopenia,per DEXA 12-09 Former heavy smoker keloid @ chest     Past Surgical History: Hysterectomy Total knee replacement  1999 Oophorectomy Coronary artery bypass graft  1998 Caesarean section x 2 Hemorrhoidectomy   Social History: Divorced x many years. [place] grand son lives with her,  Has a life alert Still drives   she has one living daughter in [place] Tobacco-- quit  2004 apros, used to smoke  1.5 ppd  ETOH--no   Review of Systems Occasionally has chest pain, usually it goes away with one or 2 nitroglycerin. No shortness of breath. Chest pain pattern is not crescendo. No nausea, vomiting, diarrhea. History of asthma, very rarely needs albuterol, request a refill.    Objective:   Physical Exam  Constitutional: She is oriented to person, place, and time. She appears well-developed and well-nourished. No distress.  Cardiovascular:       Question of systolic murmur  Pulmonary/Chest: Effort normal and breath sounds normal. No respiratory distress. She has no wheezes. She has no rales.  Musculoskeletal:       Uses a walker appropriately.  Neurological: She is alert and oriented to person, place, and time.  Skin: She is not diaphoretic.  Psychiatric:  She has a normal mood and affect. Her behavior is normal.       Assessment & Plan:

## 2011-08-20 NOTE — Assessment & Plan Note (Signed)
Coronary artery disease, Seems a stable, chest pain in a stable pattern and responding to nitroglycerin. No change.

## 2011-08-20 NOTE — Assessment & Plan Note (Signed)
Hypertension, BP today slightly elevated, ambulatory BPs within normal. See instructions, will recommend close monitoring of her BP.

## 2011-08-20 NOTE — Patient Instructions (Addendum)
Please come back fasting: CMP--- dx  hypertension FLP-- dx high  Cholesterol Hemoglobin A1c---dx diabetes T SH, free T3, free T4-- dx hyperthyroidism  Check the  blood pressure 2 or 3 times a week, be sure it is less than 140/85. If it is consistently higher, let me know

## 2011-08-20 NOTE — Assessment & Plan Note (Signed)
Request an inhaler in case she needs it. Done

## 2011-08-20 NOTE — Assessment & Plan Note (Addendum)
Off tapazole since 2-12 (the last time she saw endocrinology) labs

## 2011-08-20 NOTE — Assessment & Plan Note (Signed)
Diabetes, good medication compliance, due for a hemoglobin A1c.

## 2011-08-23 ENCOUNTER — Other Ambulatory Visit (INDEPENDENT_AMBULATORY_CARE_PROVIDER_SITE_OTHER): Payer: Medicare Other

## 2011-08-23 DIAGNOSIS — E0590 Thyrotoxicosis, unspecified without thyrotoxic crisis or storm: Secondary | ICD-10-CM

## 2011-08-23 DIAGNOSIS — E119 Type 2 diabetes mellitus without complications: Secondary | ICD-10-CM

## 2011-08-23 DIAGNOSIS — E785 Hyperlipidemia, unspecified: Secondary | ICD-10-CM

## 2011-08-23 LAB — HEMOGLOBIN A1C: Hgb A1c MFr Bld: 7 % — ABNORMAL HIGH (ref 4.6–6.5)

## 2011-08-23 LAB — T3, FREE: T3, Free: 2.8 pg/mL (ref 2.3–4.2)

## 2011-08-23 LAB — TSH: TSH: 0.77 u[IU]/mL (ref 0.35–5.50)

## 2011-08-23 LAB — LIPID PANEL: VLDL: 13.2 mg/dL (ref 0.0–40.0)

## 2011-08-26 ENCOUNTER — Encounter: Payer: Self-pay | Admitting: *Deleted

## 2011-09-08 ENCOUNTER — Encounter: Payer: Self-pay | Admitting: Internal Medicine

## 2011-09-11 HISTORY — PX: TRANSTHORACIC ECHOCARDIOGRAM: SHX275

## 2011-10-20 ENCOUNTER — Other Ambulatory Visit: Payer: Self-pay | Admitting: Internal Medicine

## 2011-10-21 NOTE — Telephone Encounter (Signed)
Refill done.  

## 2011-11-20 ENCOUNTER — Ambulatory Visit (INDEPENDENT_AMBULATORY_CARE_PROVIDER_SITE_OTHER): Payer: Medicare Other | Admitting: Internal Medicine

## 2011-11-20 VITALS — BP 124/76 | HR 55 | Temp 97.9°F | Ht 62.5 in | Wt 185.0 lb

## 2011-11-20 DIAGNOSIS — E119 Type 2 diabetes mellitus without complications: Secondary | ICD-10-CM

## 2011-11-20 DIAGNOSIS — M899 Disorder of bone, unspecified: Secondary | ICD-10-CM

## 2011-11-20 DIAGNOSIS — M949 Disorder of cartilage, unspecified: Secondary | ICD-10-CM

## 2011-11-20 DIAGNOSIS — E785 Hyperlipidemia, unspecified: Secondary | ICD-10-CM

## 2011-11-20 DIAGNOSIS — E0590 Thyrotoxicosis, unspecified without thyrotoxic crisis or storm: Secondary | ICD-10-CM

## 2011-11-20 DIAGNOSIS — F411 Generalized anxiety disorder: Secondary | ICD-10-CM

## 2011-11-20 DIAGNOSIS — F419 Anxiety disorder, unspecified: Secondary | ICD-10-CM | POA: Insufficient documentation

## 2011-11-20 DIAGNOSIS — I2510 Atherosclerotic heart disease of native coronary artery without angina pectoris: Secondary | ICD-10-CM

## 2011-11-20 DIAGNOSIS — N390 Urinary tract infection, site not specified: Secondary | ICD-10-CM

## 2011-11-20 DIAGNOSIS — R269 Unspecified abnormalities of gait and mobility: Secondary | ICD-10-CM

## 2011-11-20 DIAGNOSIS — Z0000 Encounter for general adult medical examination without abnormal findings: Secondary | ICD-10-CM

## 2011-11-20 DIAGNOSIS — J45909 Unspecified asthma, uncomplicated: Secondary | ICD-10-CM

## 2011-11-20 DIAGNOSIS — R42 Dizziness and giddiness: Secondary | ICD-10-CM

## 2011-11-20 DIAGNOSIS — I4891 Unspecified atrial fibrillation: Secondary | ICD-10-CM

## 2011-11-20 DIAGNOSIS — I10 Essential (primary) hypertension: Secondary | ICD-10-CM

## 2011-11-20 DIAGNOSIS — M1990 Unspecified osteoarthritis, unspecified site: Secondary | ICD-10-CM

## 2011-11-20 HISTORY — DX: Anxiety disorder, unspecified: F41.9

## 2011-11-20 LAB — AST: AST: 30 U/L (ref 0–37)

## 2011-11-20 LAB — POCT URINALYSIS DIPSTICK
Ketones, UA: NEGATIVE
Protein, UA: NEGATIVE
Spec Grav, UA: <=1.005
pH, UA: 6.5

## 2011-11-20 LAB — CBC WITH DIFFERENTIAL/PLATELET
Basophils Relative: 0.5 % (ref 0.0–3.0)
Eosinophils Absolute: 0.2 10*3/uL (ref 0.0–0.7)
HCT: 42.1 % (ref 36.0–46.0)
Hemoglobin: 13.4 g/dL (ref 12.0–15.0)
Lymphs Abs: 1.2 10*3/uL (ref 0.7–4.0)
MCHC: 32 g/dL (ref 30.0–36.0)
MCV: 100.1 fl — ABNORMAL HIGH (ref 78.0–100.0)
Monocytes Absolute: 0.5 10*3/uL (ref 0.1–1.0)
Neutro Abs: 3.6 10*3/uL (ref 1.4–7.7)
RBC: 4.2 Mil/uL (ref 3.87–5.11)

## 2011-11-20 LAB — ALT: ALT: 28 U/L (ref 0–35)

## 2011-11-20 LAB — BASIC METABOLIC PANEL
CO2: 29 mEq/L (ref 19–32)
Chloride: 101 mEq/L (ref 96–112)
Sodium: 142 mEq/L (ref 135–145)

## 2011-11-20 MED ORDER — ZOSTER VACCINE LIVE 19400 UNT/0.65ML ~~LOC~~ SOLR: 0.6500 mL | Freq: Once | SUBCUTANEOUS | Status: AC

## 2011-11-20 MED ORDER — ALPRAZOLAM 0.5 MG PO TABS: 0.5000 mg | ORAL_TABLET | Freq: Three times a day (TID) | ORAL | Status: DC | PRN

## 2011-11-20 NOTE — Assessment & Plan Note (Signed)
Seems to be well-controlled, no change 

## 2011-11-20 NOTE — Assessment & Plan Note (Signed)
Occasional chest pain, appropriate use and good response to nitroglycerin

## 2011-11-20 NOTE — Assessment & Plan Note (Signed)
Reports pain at the right sacroiliac area, see physical exam, there is a slightly asymmetric. Plan: X-ray Tylenol If not better will need further eval by orthopedic surgery

## 2011-11-20 NOTE — Progress Notes (Signed)
  Subjective:    Patient ID: Rebecca Skinner, female    DOB: 1923-01-05, 78 y.o.   MRN: [id number]  HPI Here for Medicare AWV: 1. Risk factors based on Past M, S, F history: reviewed 2. Physical Activities: limited physical activity 3. Depression/mood:  See below  4. Hearing:  Decreased on the R x long time, no tinnitus, not interested in further eval, no problems noted w/ normal conversation 5. ADL's:  Still drives, independent on ADL, no mental impairments; does have a will and health directives (daughter) 6. Fall Risk: high risk b/c uses a walker, no recent falls; counseled   7. home Safety: does feel safe at home  8. Height, weight, &visual acuity: see VS, sees eye doctor routinely  9. Counseling: provided 10. Labs ordered based on risk factors: if needed  11. Referral Coordination: if needed 12.  Care Plan, see assessment and plan  13.   Cognitive Assessment: Cognition seems to be very good, motor skills limited.   In addition, today we discussed the following: Diabetes, on no medications, rarely check CBGs, could not tell me what readings she gets. Complains of chronic right buttock pain. See physical exam. Complaining of "nerves", denies depression or suicidal ideas, just nervous from time to time, "aggravated" Heart disease, good compliance with aspirin, uses nitroglycerin from time to time for chest pain, good relief, uses it less frequent that once every 2 weeks. History of asthma, only  slightly short of breath and wheezy if she "gets in a hurry", uses albuterol rarely.   Past Medical History: Diabetes mellitus, type II HTN Hyperlipidemia Hypothyroidism CV ---CAD, s/p CABG, s/p Ao Valve replacement ([place] [facility]) ---ATRIAL FIBRILLATION, PAROXYSMAL--Asthma Recurrent UTIs dry skin Osteoarthritis Osteopenia,per DEXA 12-09 Former heavy smoker keloid @ chest   Past Surgical History: Csection x 2  Hysterectomy & oophorectomy Total knee replacement  1999 Coronary  artery bypass graft  1998 Hemorrhoidectomy   Social History: Divorced x many years. Great grand son lives with her,  Has a life alert Still drives    she has 2  Daughters they live in [place] Tobacco-- quit  2004 apros, used to smoke  1.5 ppd   ETOH--no  FH: [facility] [facility]. Review of Systems No nausea, vomiting, diarrhea. No blood in the stools. No dysuria, gross hematuria, no vaginal discharge.     Objective:   Physical Exam  Musculoskeletal:       Back:    General -- alert, well-developed, no apparent distress  Lungs -- normal respiratory effort, no intercostal retractions, no accessory muscle use, and normal breath sounds.   Heart-- bradycardic, question of systolic murmur Abdomen--soft, non-tender, no distention Extremities-- no pretibial edema bilaterally Neurologic-- alert & oriented X3 , walks with some difficulty with a walker. Psych-- Cognition and judgment appear intact. Alert and cooperative with normal attention span and concentration.  not anxious appearing and not depressed appearing.          Assessment & Plan:

## 2011-11-20 NOTE — Assessment & Plan Note (Signed)
Does not desire to continue with Antivert.

## 2011-11-20 NOTE — Assessment & Plan Note (Addendum)
Discussed patient order a bone density test. Pt likes to proceed

## 2011-11-20 NOTE — Assessment & Plan Note (Signed)
cards d/c coumadin 12-2008 d/t persistent NSR and frecuent falls ---restarted coumadin ~ July 2011 Per cardiology note from 08-30-11, coumadin d/c , added plavix  Plan: Continue with aspirin and Plavix

## 2011-11-20 NOTE — Assessment & Plan Note (Signed)
Asx, check a udip

## 2011-11-20 NOTE — Patient Instructions (Addendum)
Take half or one tablet of alprazolam (Xanax) 3 times a day as needed for nerves or difficulty sleeping, watch for drowsiness during the daytime For the pain at the back, take Tylenol 500 mg 4 times a day as needed --------------------- Please get your x-ray at the other [facility]  office located at: [address] [place], across from [facility].  Please go to the basement, this is a walk-in facility, they are open from 8:30 to 5:30 PM. Phone number [phone].

## 2011-11-20 NOTE — Assessment & Plan Note (Signed)
Appropriate use of albuterol, see history of present illness. No change

## 2011-11-20 NOTE — Assessment & Plan Note (Signed)
Well controlled, check LFTs 

## 2011-11-20 NOTE — Assessment & Plan Note (Signed)
Uses her walker consistently, no recent falls.

## 2011-11-20 NOTE — Assessment & Plan Note (Signed)
Last TSH normal. Recheck  On return to the office

## 2011-11-20 NOTE — Assessment & Plan Note (Addendum)
Complaining of "nerves" No depression Has used Xanax before for insomnia. Plan: Very low dose of Xanax 3 times a day when necessary noting we are discontinuing Flexeril and Antivert today

## 2011-11-20 NOTE — Assessment & Plan Note (Signed)
Last hemoglobin A1c 7, A1c goal in this elderly lady is between 7 and 8

## 2011-11-21 ENCOUNTER — Encounter: Payer: Self-pay | Admitting: *Deleted

## 2011-11-21 ENCOUNTER — Encounter: Payer: Self-pay | Admitting: Internal Medicine

## 2011-11-21 ENCOUNTER — Ambulatory Visit (INDEPENDENT_AMBULATORY_CARE_PROVIDER_SITE_OTHER)
Admission: RE | Admit: 2011-11-21 | Discharge: 2011-11-21 | Disposition: A | Discharge status: Home / Self Care | Payer: Medicare Other | Source: Ambulatory Visit | Attending: Internal Medicine | Admitting: Internal Medicine

## 2011-11-21 DIAGNOSIS — Z0000 Encounter for general adult medical examination without abnormal findings: Secondary | ICD-10-CM | POA: Insufficient documentation

## 2011-11-21 DIAGNOSIS — M1990 Unspecified osteoarthritis, unspecified site: Secondary | ICD-10-CM

## 2011-11-21 NOTE — Assessment & Plan Note (Signed)
pneumonia shot 2009 Shingles immunization , Rx provided   Cscope Dr Candelaria Stagers 2005, next in 5 years , patient has declined in the past to have further screenings PAP-- h/o hysterectomy due to bleeding, B MMG neg 7-11---> no further screening  A healthy lifestyle was discussed

## 2011-11-23 LAB — URINE CULTURE

## 2011-11-25 MED ORDER — CIPROFLOXACIN HCL 250 MG PO TABS: 250.0000 mg | ORAL_TABLET | Freq: Two times a day (BID) | ORAL | Status: AC

## 2011-11-25 NOTE — Addendum Note (Signed)
Addended by: Edwena Felty T on: 11/25/2011 01:43 PM   Modules accepted: Orders

## 2011-12-02 ENCOUNTER — Other Ambulatory Visit: Payer: Self-pay | Admitting: Internal Medicine

## 2011-12-03 NOTE — Telephone Encounter (Signed)
Spoke with pt & she states she is still having pain & would like a refill on cipro. OK to refill?

## 2011-12-05 NOTE — Telephone Encounter (Signed)
Does not need a RF , needs a UCX by mid july

## 2011-12-05 NOTE — Telephone Encounter (Signed)
Discussed with pt

## 2011-12-23 ENCOUNTER — Other Ambulatory Visit: Payer: Medicare Other

## 2011-12-23 DIAGNOSIS — N390 Urinary tract infection, site not specified: Secondary | ICD-10-CM

## 2011-12-25 ENCOUNTER — Other Ambulatory Visit: Payer: Self-pay | Admitting: *Deleted

## 2011-12-25 MED ORDER — METOPROLOL TARTRATE 25 MG PO TABS: 25.0000 mg | ORAL_TABLET | Freq: Three times a day (TID) | ORAL | Status: DC

## 2011-12-25 NOTE — Telephone Encounter (Signed)
Refill done.  

## 2011-12-27 LAB — URINE CULTURE

## 2011-12-27 MED ORDER — AMOXICILLIN 500 MG PO CAPS: 500.0000 mg | ORAL_CAPSULE | Freq: Three times a day (TID) | ORAL | Status: AC

## 2011-12-30 ENCOUNTER — Encounter: Payer: Self-pay | Admitting: Internal Medicine

## 2011-12-30 ENCOUNTER — Ambulatory Visit (INDEPENDENT_AMBULATORY_CARE_PROVIDER_SITE_OTHER): Payer: Medicare Other | Admitting: Internal Medicine

## 2011-12-30 VITALS — BP 134/86 | HR 64 | Temp 97.9°F | Wt 186.0 lb

## 2011-12-30 DIAGNOSIS — E119 Type 2 diabetes mellitus without complications: Secondary | ICD-10-CM

## 2011-12-30 DIAGNOSIS — I10 Essential (primary) hypertension: Secondary | ICD-10-CM

## 2011-12-30 DIAGNOSIS — N390 Urinary tract infection, site not specified: Secondary | ICD-10-CM

## 2011-12-30 NOTE — Assessment & Plan Note (Signed)
Well-controlled, no change 

## 2011-12-30 NOTE — Progress Notes (Addendum)
  Subjective:    Patient ID: Rebecca Skinner, female    DOB: [birth year], 78 y.o.   MRN: [id number]  HPI Here for a checkup Good medication compliance with diabetes medicines, not checking her sugars currently,  she does not have symptoms consistent with low blood sugars. Hypertension, good medication compliance with medicines. CAD, still has some chest pain, no worse than before  Diagnosed with a UTI a few weeks ago, rechecked urine showed enterococcus, currently on antibiotics. She is asymptomatic, see review of systems   Past Medical History:  Diabetes mellitus, type II  HTN  Hyperlipidemia  Hypothyroidism  CV  ---CAD, s/p CABG, s/p Ao Valve replacement ([place] [facility])  ---ATRIAL FIBRILLATION, PAROXYSMAL--Asthma  Recurrent UTIs  dry skin  Osteoarthritis  Osteopenia,per DEXA 12-09  Former heavy smoker  keloid @ chest  Past Surgical History:  Csection x 2  Hysterectomy & oophorectomy  Total knee replacement 1999  Coronary artery bypass graft 1998  Hemorrhoidectomy  Social History:  Divorced x many years.  Lives by herself,has a life alert  Still drives  she has 2 Daughters they live in [place]  Tobacco-- quit 2004 apros, used to smoke 1.5 ppd  ETOH--no   Review of Systems No nausea, vomiting, diarrhea or blood in the stools No dysuria, gross hematuria difficulty urinating occ right lower extremity edema, usually if she rides in a car, symptoms decrease with rest-leg elevation.    Objective:   Physical Exam  General -- alert, well-developed, no apparent distress.  Neck --no LADs Lungs -- normal respiratory effort, no intercostal retractions, no accessory muscle use, and normal breath sounds.   Heart-- normal rate, regular rhythm, no murmur, and no gallop.   Extremities-- no pretibial edema bilaterally , calves symmetric Psych-- Cognition and judgment appear intact. Alert and cooperative with normal attention span and concentration.  not anxious appearing and not  depressed appearing.       Assessment & Plan:   On and off right lower extremity edema, recommend to call if the edema persists or she has calf pain

## 2011-12-30 NOTE — Assessment & Plan Note (Signed)
Recent urine culture +, on abx, she is asx

## 2011-12-30 NOTE — Assessment & Plan Note (Addendum)
Well-controlled, no change. Has been unable to check her CBGs because not getting enough sample when she lancet her fingers. Recommend to drink fluids before testing

## 2012-01-01 ENCOUNTER — Telehealth: Payer: Self-pay | Admitting: *Deleted

## 2012-01-01 MED ORDER — ESOMEPRAZOLE MAGNESIUM 40 MG PO CPDR: 40.0000 mg | DELAYED_RELEASE_CAPSULE | Freq: Every day | ORAL | Status: DC

## 2012-01-01 NOTE — Telephone Encounter (Signed)
Refill done.  

## 2012-02-11 ENCOUNTER — Other Ambulatory Visit: Payer: Self-pay | Admitting: Internal Medicine

## 2012-02-11 NOTE — Telephone Encounter (Signed)
Refill done.  

## 2012-02-19 ENCOUNTER — Encounter: Payer: Self-pay | Admitting: Internal Medicine

## 2012-02-19 ENCOUNTER — Telehealth: Payer: Self-pay | Admitting: Internal Medicine

## 2012-02-19 NOTE — Telephone Encounter (Signed)
Advise patient, bone density test from 02/06/2012 is stable, she has mild decrease in the calcium in her bones, osteopenia. Recommend to stay active, take calcium and vitamin D daily.

## 2012-02-20 NOTE — Telephone Encounter (Signed)
Notified pt and she voices understanding. Will start calcium as it is not currently on her med list.

## 2012-02-25 ENCOUNTER — Encounter: Payer: Self-pay | Admitting: Internal Medicine

## 2012-03-03 ENCOUNTER — Other Ambulatory Visit: Payer: Self-pay | Admitting: Internal Medicine

## 2012-03-03 NOTE — Telephone Encounter (Signed)
Refill done.  

## 2012-03-09 ENCOUNTER — Other Ambulatory Visit: Payer: Self-pay | Admitting: Internal Medicine

## 2012-03-10 NOTE — Telephone Encounter (Signed)
Ok to refill 

## 2012-03-10 NOTE — Telephone Encounter (Signed)
Refill done.  

## 2012-03-10 NOTE — Telephone Encounter (Signed)
Ok 60, 3 RF 

## 2012-05-02 ENCOUNTER — Other Ambulatory Visit: Payer: Self-pay | Admitting: Internal Medicine

## 2012-05-04 NOTE — Telephone Encounter (Signed)
Discussed with pt

## 2012-05-04 NOTE — Telephone Encounter (Signed)
Ok to refill 

## 2012-05-04 NOTE — Telephone Encounter (Signed)
Advise patient: I'm refilling 30 tablets, she needs to watch for somnolence. Will discuss if she needs to continue on that when she comes back

## 2012-05-18 ENCOUNTER — Ambulatory Visit (INDEPENDENT_AMBULATORY_CARE_PROVIDER_SITE_OTHER): Payer: Medicare Other | Admitting: Internal Medicine

## 2012-05-18 VITALS — BP 124/76 | HR 65 | Temp 97.5°F | Wt 187.0 lb

## 2012-05-18 DIAGNOSIS — E119 Type 2 diabetes mellitus without complications: Secondary | ICD-10-CM

## 2012-05-18 DIAGNOSIS — E0590 Thyrotoxicosis, unspecified without thyrotoxic crisis or storm: Secondary | ICD-10-CM

## 2012-05-18 DIAGNOSIS — M1990 Unspecified osteoarthritis, unspecified site: Secondary | ICD-10-CM

## 2012-05-18 DIAGNOSIS — I10 Essential (primary) hypertension: Secondary | ICD-10-CM

## 2012-05-18 DIAGNOSIS — I2510 Atherosclerotic heart disease of native coronary artery without angina pectoris: Secondary | ICD-10-CM

## 2012-05-18 DIAGNOSIS — E785 Hyperlipidemia, unspecified: Secondary | ICD-10-CM

## 2012-05-18 LAB — BASIC METABOLIC PANEL
BUN: 18 mg/dL (ref 6–23)
Creatinine, Ser: 1.1 mg/dL (ref 0.4–1.2)
GFR: 61.01 mL/min (ref >60.00)

## 2012-05-18 LAB — ALT: ALT: 21 U/L (ref 0–35)

## 2012-05-18 LAB — HEMOGLOBIN A1C: Hgb A1c MFr Bld: 7.2 % — ABNORMAL HIGH (ref 4.6–6.5)

## 2012-05-18 NOTE — Assessment & Plan Note (Signed)
Well-controlled. Currently on amlodipine, occasional edema, no edema today. Plan: Check a BMP, no change in meds

## 2012-05-18 NOTE — Assessment & Plan Note (Addendum)
On diet only, having a hard time checking CBGs. Her A1c's have been consistently less than 7, does not take any medication that could trigger  hypoglycemia Plan:  A1c, not ambulatory CBGs plans to see ophthalmology this month.

## 2012-05-18 NOTE — Progress Notes (Signed)
  Subjective:    Patient ID: Rebecca Skinner, female    DOB: 06-01-1923, 78 y.o.   MRN: [id number]  HPI Routine office visit Hypertension, good medication compliance, ambulatory BPs are in the 120s / 70s. High cholesterol, good medication compliance GERD, on PPIs, currently asymptomatic Diabetes, on diet only, is not checking her CBGs "hard to get blood from the fingers" CAD, occasional chest pain, mostly when she lays down on the left side, symptoms are not crescendo. She saw cardiology a few months ago, she was in stable, plans to see cardiology this month.  Past Medical History: Diabetes mellitus, type II HTN Hyperlipidemia Hypothyroidism CV ---CAD, s/p CABG, s/p Ao Valve replacement ([place] [facility]) ---ATRIAL FIBRILLATION, PAROXYSMAL--Asthma Recurrent UTIs dry skin Osteoarthritis Osteopenia,per DEXA 12-09 Former heavy smoker keloid @ chest   Past Surgical History: Csection x 2  Hysterectomy & oophorectomy Total knee replacement  1999 Coronary artery bypass graft  1998 Hemorrhoidectomy   Social History: Divorced x many years. Lives by herself,has a life alert Still drives    she has 2  Daughters they live in [place] Tobacco-- quit  2004 apros, used to smoke  1.5 ppd   ETOH--no  Review of Systems No nausea, vomiting, diarrhea No edema, except sometimes at the end of the day Dyspnea on exertion only if she tries to hurry. No cough. Denies feet burning type of feeling    Objective:   Physical Exam General -- never really in no distress, alert, well-developed, and well-nourished.Couldn't get in the table to be examined. Neck --no thyromegaly , normal carotid pulse, no LADs Lungs -- normal respiratory effort, no intercostal retractions, no accessory muscle use, and normal breath sounds.   Heart--  seems regular.   Extremities-- no pretibial edema bilaterally Psych-- Cognition and judgment appear intact. Alert and cooperative with normal attention span and  concentration.  not anxious appearing and not depressed appearing.       Assessment & Plan:

## 2012-05-18 NOTE — Assessment & Plan Note (Signed)
Seems stable, we'll see cardiology this month.

## 2012-05-18 NOTE — Patient Instructions (Addendum)
You don't need to check your blood sugars Next visit in 5 to 6 months

## 2012-05-18 NOTE — Assessment & Plan Note (Signed)
History of suppressed TSH before, check a TSH

## 2012-05-18 NOTE — Assessment & Plan Note (Signed)
per insurance, Flexeril won't be covered next year, alternatives are Celebrex, diclofenac, meloxicam,Tizanidine

## 2012-05-19 ENCOUNTER — Encounter: Payer: Self-pay | Admitting: Internal Medicine

## 2012-05-20 ENCOUNTER — Telehealth: Payer: Self-pay

## 2012-05-20 MED ORDER — SITAGLIPTIN PHOSPHATE 50 MG PO TABS: 50.0000 mg | ORAL_TABLET | Freq: Every day | ORAL | Status: DC

## 2012-05-20 NOTE — Telephone Encounter (Signed)
Message copied by Maurice Small on Wed May 20, 2012  4:16 PM ------      Message from: Willow Ora E      Created: Tue May 19, 2012  9:39 PM       Advise patient:      Labs ok except for diabetes which needs better control      Continue same meds, add Januvia 50 mg 1 a day , call #30, 6 RF      F/u in 3-4 months, call if s/e

## 2012-05-20 NOTE — Telephone Encounter (Signed)
Spoke with patient, patient verbalized understanding of results. Patient aware rx sent to pharmacy. Follow-up scheduled for September 22, 2012 at 11:00 am.  Copy of report mailed to patient

## 2012-05-21 ENCOUNTER — Telehealth: Payer: Self-pay | Admitting: Internal Medicine

## 2012-05-21 ENCOUNTER — Ambulatory Visit: Payer: Medicare Other | Admitting: Internal Medicine

## 2012-05-21 NOTE — Telephone Encounter (Signed)
Conatcted pt, placed samples for One Touch Verio Meter with test strips up front for pt to p/u. Will call back if she likes this and we can call in Rx.

## 2012-05-21 NOTE — Telephone Encounter (Signed)
Patient was seen in the office on 05/18/12 by Dr. Drue Novel.  She states she went to the pharmacy- [facility] [address] yesterday [phone] to get her diabetic supplies. She states the pharmacist told her that her Freestyle Test strips, lancets and been recalled.  She will need new supplies.  Patient is calling in requesting a new meter, test strips and lancets.  Please contact patient and pharmacy for assistance.

## 2012-05-25 ENCOUNTER — Ambulatory Visit: Payer: Medicare Other | Admitting: *Deleted

## 2012-05-25 DIAGNOSIS — E119 Type 2 diabetes mellitus without complications: Secondary | ICD-10-CM

## 2012-05-25 MED ORDER — GLUCOSE BLOOD VI STRP: ORAL_STRIP | Status: DC

## 2012-05-25 MED ORDER — ONETOUCH LANCETS MISC: Status: DC

## 2012-06-01 ENCOUNTER — Other Ambulatory Visit: Payer: Self-pay | Admitting: Internal Medicine

## 2012-06-04 NOTE — Telephone Encounter (Signed)
Spoke to scott at the pharmacy & he confirmed pt still has refills left on file. Refill request sent in error.

## 2012-06-12 ENCOUNTER — Other Ambulatory Visit: Payer: Self-pay | Admitting: Cardiovascular Disease

## 2012-06-12 DIAGNOSIS — R109 Unspecified abdominal pain: Secondary | ICD-10-CM

## 2012-06-15 ENCOUNTER — Ambulatory Visit
Admission: RE | Admit: 2012-06-15 | Discharge: 2012-06-15 | Disposition: A | Discharge status: Home / Self Care | Payer: Medicare Other | Source: Ambulatory Visit | Attending: Cardiovascular Disease | Admitting: Cardiovascular Disease

## 2012-06-15 DIAGNOSIS — R109 Unspecified abdominal pain: Secondary | ICD-10-CM

## 2012-06-16 ENCOUNTER — Other Ambulatory Visit (HOSPITAL_COMMUNITY): Payer: Self-pay | Admitting: Cardiovascular Disease

## 2012-06-16 DIAGNOSIS — R0989 Other specified symptoms and signs involving the circulatory and respiratory systems: Secondary | ICD-10-CM

## 2012-06-20 ENCOUNTER — Other Ambulatory Visit: Payer: Self-pay | Admitting: Internal Medicine

## 2012-06-25 NOTE — Telephone Encounter (Signed)
Refill done.  

## 2012-06-25 NOTE — Telephone Encounter (Signed)
Ok to refill 

## 2012-06-25 NOTE — Telephone Encounter (Signed)
Okay x1 

## 2012-07-07 NOTE — Progress Notes (Signed)
Showed pt how to use her glucometer & ordered test strips/lancets.

## 2012-07-08 ENCOUNTER — Encounter (HOSPITAL_COMMUNITY): Payer: Medicare Other

## 2012-07-10 ENCOUNTER — Other Ambulatory Visit: Payer: Self-pay | Admitting: Internal Medicine

## 2012-07-13 ENCOUNTER — Ambulatory Visit (HOSPITAL_COMMUNITY)
Admission: RE | Admit: 2012-07-13 | Discharge: 2012-07-13 | Disposition: A | Discharge status: Home / Self Care | Payer: Medicare Other | Source: Ambulatory Visit | Attending: Cardiovascular Disease | Admitting: Cardiovascular Disease

## 2012-07-13 DIAGNOSIS — R0989 Other specified symptoms and signs involving the circulatory and respiratory systems: Secondary | ICD-10-CM | POA: Insufficient documentation

## 2012-07-13 HISTORY — PX: OTHER SURGICAL HISTORY: SHX169

## 2012-07-13 NOTE — Telephone Encounter (Signed)
Ok #30, 1 RF 

## 2012-07-13 NOTE — Telephone Encounter (Signed)
Refill done.  

## 2012-07-13 NOTE — Progress Notes (Signed)
Carotid artery duplex completed.   07/13/2012  Gertie Fey, RDMS, RDCS

## 2012-07-13 NOTE — Telephone Encounter (Signed)
Ok to refill? Last OV 12.9.13 Last filled 11.23.13

## 2012-08-06 ENCOUNTER — Other Ambulatory Visit: Payer: Self-pay | Admitting: Internal Medicine

## 2012-08-06 NOTE — Telephone Encounter (Signed)
Refill done.  

## 2012-08-23 ENCOUNTER — Other Ambulatory Visit: Payer: Self-pay | Admitting: Internal Medicine

## 2012-08-24 NOTE — Telephone Encounter (Signed)
Refill done.  

## 2012-08-29 ENCOUNTER — Other Ambulatory Visit: Payer: Self-pay | Admitting: Internal Medicine

## 2012-08-31 NOTE — Telephone Encounter (Signed)
Refill done.  

## 2012-09-22 ENCOUNTER — Ambulatory Visit: Payer: Medicare Other | Admitting: Internal Medicine

## 2012-10-13 ENCOUNTER — Encounter: Payer: Self-pay | Admitting: Lab

## 2012-10-14 ENCOUNTER — Ambulatory Visit (INDEPENDENT_AMBULATORY_CARE_PROVIDER_SITE_OTHER): Payer: Medicare Other | Admitting: Internal Medicine

## 2012-10-14 VITALS — BP 128/84 | HR 48 | Temp 98.2°F | Wt 184.0 lb

## 2012-10-14 DIAGNOSIS — M899 Disorder of bone, unspecified: Secondary | ICD-10-CM

## 2012-10-14 DIAGNOSIS — R42 Dizziness and giddiness: Secondary | ICD-10-CM

## 2012-10-14 DIAGNOSIS — E0590 Thyrotoxicosis, unspecified without thyrotoxic crisis or storm: Secondary | ICD-10-CM

## 2012-10-14 DIAGNOSIS — M949 Disorder of cartilage, unspecified: Secondary | ICD-10-CM

## 2012-10-14 DIAGNOSIS — E119 Type 2 diabetes mellitus without complications: Secondary | ICD-10-CM

## 2012-10-14 DIAGNOSIS — J45909 Unspecified asthma, uncomplicated: Secondary | ICD-10-CM

## 2012-10-14 DIAGNOSIS — E785 Hyperlipidemia, unspecified: Secondary | ICD-10-CM

## 2012-10-14 DIAGNOSIS — I2510 Atherosclerotic heart disease of native coronary artery without angina pectoris: Secondary | ICD-10-CM

## 2012-10-14 LAB — CBC WITH DIFFERENTIAL/PLATELET
Basophils Relative: 0.4 % (ref 0.0–3.0)
Eosinophils Absolute: 0.2 10*3/uL (ref 0.0–0.7)
Hemoglobin: 13.3 g/dL (ref 12.0–15.0)
Lymphs Abs: 1.1 10*3/uL (ref 0.7–4.0)
MCHC: 33.7 g/dL (ref 30.0–36.0)
MCV: 96.2 fl (ref 78.0–100.0)
Monocytes Absolute: 0.5 10*3/uL (ref 0.1–1.0)
Neutro Abs: 3.7 10*3/uL (ref 1.4–7.7)
RBC: 4.11 Mil/uL (ref 3.87–5.11)

## 2012-10-14 LAB — HEMOGLOBIN A1C: Hgb A1c MFr Bld: 6.6 % — ABNORMAL HIGH (ref 4.6–6.5)

## 2012-10-14 LAB — BASIC METABOLIC PANEL
GFR: 68.92 mL/min (ref >60.00)
Potassium: 3.7 mEq/L (ref 3.5–5.1)
Sodium: 140 mEq/L (ref 135–145)

## 2012-10-14 LAB — LIPID PANEL: Total CHOL/HDL Ratio: 3

## 2012-10-14 MED ORDER — NITROGLYCERIN 0.3 MG SL SUBL: 0.3000 mg | SUBLINGUAL_TABLET | SUBLINGUAL | Status: DC | PRN

## 2012-10-14 MED ORDER — ALBUTEROL SULFATE HFA 108 (90 BASE) MCG/ACT IN AERS: 2.0000 | INHALATION_SPRAY | Freq: Four times a day (QID) | RESPIRATORY_TRACT | Status: DC | PRN

## 2012-10-14 NOTE — Assessment & Plan Note (Signed)
Saw cards few months ago, stable, RF NTG although denies recent CP

## 2012-10-14 NOTE — Assessment & Plan Note (Signed)
Good med compliance , labs  

## 2012-10-14 NOTE — Patient Instructions (Addendum)
Take calcium 1 gram and vit D 600 units a day

## 2012-10-14 NOTE — Progress Notes (Signed)
  Subjective:    Patient ID: Rebecca Skinner, female    DOB: 03-31-1923, 79 y.o.   MRN: [id number]  HPI ROV Cardiovascular, saw cardiology few months ago,had a carotid ultrasound, she was felt to be stable. High cholesterol, good medication compliance, no apparent side effects. DJD, she does have about DJD in the knees, mostly at the right knee. Has seen orthopedic surgery regularly. At some point she was taking Relafen, today he reports she's not Diabetes, good medication compliance, ambulatory CBGs 120 on average in the morning.  hypertension, good medication compliance, no ambulatory BPs.   Past Medical History  Diagnosis Date  . Asthma   . Diabetes mellitus   . Hypertension   . Hyperlipidemia   . CAD (coronary artery disease)     s/p CABG s/p AO valve [place]  . Recurrent UTI   . Paroxysmal atrial fibrillation     cards d/c coumadin 12/2008 d/t persisten NSR and frequent falls- restarted coumadin july 2011  . Hyperthyroidism   . Dry skin   . Osteoarthritis   . Osteopenia     per dexa 12/09  . Keloid     @ chest  . Dizziness     Chronic, admiet 07-2010,saw neuro, thought to be a peripheral issue    Past Surgical History  Procedure Laterality Date  . Abdominal hysterectomy    . Total knee arthroplasty  1999  . Oophorectomy    . Cagb  1998  . Cesarean section      x 2  . Hemorrhoid surgery       Past Surgical History:   Csection x 2   Hysterectomy & oophorectomy   Total knee replacement 1999   Coronary artery bypass graft 1998   Hemorrhoidectomy    Social History:   Divorced x many years.   Lives by herself,has a life alert   Still drives   she has 2 Daughters they live in [place]   Tobacco-- quit 2004 apros, used to smoke 1.5 ppd   ETOH--no   Review of Systems No chest pain, occasional edema of the right leg. From time to time has DOE, not associated with cough but sometimes w/ wheezing. Uses albuterol on average 3 times a week. No  nausea, vomiting, diarrhea or blood in the stools.     Objective:   Physical Exam BP 128/84  Pulse 48  Temp(Src) 98.2 F (36.8 C) (Oral)  Wt 184 lb (83.462 kg)  BMI 33.1 kg/m2  SpO2 97%  General -- alert, well-developed, Walks with difficulty using a walker    Lungs -- normal respiratory effort, no intercostal retractions, no accessory muscle use, and  breath sounds slightly decreased.   Heart-- normal rate, regular rhythm, no murmur, and no gallop.   Extremities-- no pretibial edema bilaterally Psych-- Forgetful about her medications, she does have a list of medicines with her.  not anxious appearing and not depressed appearing.       Assessment & Plan:

## 2012-10-14 NOTE — Assessment & Plan Note (Signed)
Check a TSH 

## 2012-10-14 NOTE — Assessment & Plan Note (Signed)
Due for labs

## 2012-10-14 NOTE — Assessment & Plan Note (Signed)
Well controlled, uses albuterol 2-3 /week

## 2012-10-14 NOTE — Assessment & Plan Note (Signed)
bone density test from 02/06/2012 is stable. Plan: take calcium and vitamin D daily.

## 2012-10-14 NOTE — Assessment & Plan Note (Signed)
Today request to have antivert Rx , to use as needed, low dose. Had a carotid u/s 07-2012 @ cards, mild plaque noted

## 2012-10-14 NOTE — Progress Notes (Signed)
  Subjective:    Patient ID: Rebecca Skinner, female    DOB: Oct 19, 1922, 79 y.o.   MRN: [id number]  HPI    Review of Systems     Objective:   Physical Exam        Assessment & Plan:

## 2012-10-15 ENCOUNTER — Encounter: Payer: Self-pay | Admitting: Internal Medicine

## 2012-10-20 ENCOUNTER — Encounter: Payer: Self-pay | Admitting: *Deleted

## 2012-10-20 ENCOUNTER — Telehealth: Payer: Self-pay | Admitting: *Deleted

## 2012-10-20 DIAGNOSIS — E0590 Thyrotoxicosis, unspecified without thyrotoxic crisis or storm: Secondary | ICD-10-CM

## 2012-10-20 NOTE — Telephone Encounter (Signed)
Message copied by Nada Maclachlan on Tue Oct 20, 2012 10:41 AM ------      Message from: Willow Ora E      Created: Mon Oct 19, 2012  5:40 PM       Advise patient:      Her cholesterol and blood sugar are under excellent control.       Her thyroid may be over-working again, please arrange a TSH, free T3, free T4 in 2 months. DX hyperthyroidism.      Other labs are normal, continue taking the same medications. ------

## 2012-10-20 NOTE — Telephone Encounter (Signed)
Lab orders entered

## 2012-11-10 ENCOUNTER — Encounter: Payer: Self-pay | Admitting: Internal Medicine

## 2012-11-11 ENCOUNTER — Other Ambulatory Visit: Payer: Self-pay | Admitting: Internal Medicine

## 2012-11-11 NOTE — Telephone Encounter (Signed)
Refill done.  

## 2012-11-16 ENCOUNTER — Ambulatory Visit: Payer: Medicare Other | Admitting: Internal Medicine

## 2012-12-01 ENCOUNTER — Other Ambulatory Visit: Payer: Self-pay | Admitting: Internal Medicine

## 2012-12-01 NOTE — Telephone Encounter (Signed)
Refill done.  

## 2013-02-05 ENCOUNTER — Telehealth: Payer: Self-pay | Admitting: *Deleted

## 2013-02-05 NOTE — Telephone Encounter (Signed)
A prior auth was started today for Cyclobenzaprin 10mg  02/05/13.  Ag  cma

## 2013-02-16 ENCOUNTER — Ambulatory Visit (INDEPENDENT_AMBULATORY_CARE_PROVIDER_SITE_OTHER): Payer: Medicare Other | Admitting: Internal Medicine

## 2013-02-16 ENCOUNTER — Encounter: Payer: Self-pay | Admitting: Internal Medicine

## 2013-02-16 VITALS — BP 199/72 | HR 71 | Temp 97.9°F | Ht 62.1 in | Wt 177.4 lb

## 2013-02-16 DIAGNOSIS — E119 Type 2 diabetes mellitus without complications: Secondary | ICD-10-CM

## 2013-02-16 DIAGNOSIS — N390 Urinary tract infection, site not specified: Secondary | ICD-10-CM

## 2013-02-16 DIAGNOSIS — Z23 Encounter for immunization: Secondary | ICD-10-CM

## 2013-02-16 DIAGNOSIS — Z0000 Encounter for general adult medical examination without abnormal findings: Secondary | ICD-10-CM

## 2013-02-16 DIAGNOSIS — F419 Anxiety disorder, unspecified: Secondary | ICD-10-CM

## 2013-02-16 DIAGNOSIS — F411 Generalized anxiety disorder: Secondary | ICD-10-CM

## 2013-02-16 DIAGNOSIS — I10 Essential (primary) hypertension: Secondary | ICD-10-CM

## 2013-02-16 DIAGNOSIS — E0590 Thyrotoxicosis, unspecified without thyrotoxic crisis or storm: Secondary | ICD-10-CM

## 2013-02-16 LAB — URINALYSIS, ROUTINE W REFLEX MICROSCOPIC
Hgb urine dipstick: NEGATIVE
Ketones, ur: NEGATIVE
Total Protein, Urine: NEGATIVE
Urine Glucose: NEGATIVE
WBC, UA: NONE SEEN (ref 0–?)

## 2013-02-16 LAB — T3, FREE: T3, Free: 3.6 pg/mL (ref 2.3–4.2)

## 2013-02-16 NOTE — Assessment & Plan Note (Addendum)
tdap-- discussed  pneumonia shot 2009 Shingles immunization 01-2012   Cscope Dr Candelaria Stagers 2005, next in 5 years , patient has declined in the past to have further screenings PAP-- h/o hysterectomy due to bleeding, B MMG neg 7-11---> no further screening  Daughters have her medical POA

## 2013-02-16 NOTE — Progress Notes (Signed)
Subjective:    Patient ID: Rebecca Skinner, female    DOB: 02-25-1923, 79 y.o.   MRN: [id number]  HPI Here for Medicare AWV:  1. Risk factors based on Past M, S, F history: reviewed  2. Physical Activities: limited physical activity  3. Depression/mood: neg screening  4. Hearing: Decreased on the R x long time, no tinnitus, not interested in further eval, occ  problems  w/ normal conversation  5. ADL's: Still drives, independent on ADL, no mental impairments. 6. Fall Risk: high risk b/c uses a walker, no recent falls, see instructions 7. home Safety: does feel safe at home  8. Height, weight, &visual acuity: see VS, sees eye doctor routinely  9. Counseling: provided  10. Labs ordered based on risk factors: if needed  11. Referral Coordination: if needed  12. Care Plan, see assessment and plan  13. Cognitive Assessment: Cognition seems to be very good, motor skills limited.   In addition, today we discussed the following: History of heart disease, very seldom uses nitroglycerin. On Xanax, takes it rarely. High cholesterol, good medication compliance. Diabetes, on Januvia, ambulatory blood sugars range from 90 to 150, mostly in the 120s. Hypertension, BP today is elevated, it yesterday BP at home was normal, she is taking less medication than prescribed, see assessment and plan.   Past Medical History  Diagnosis Date  . Asthma   . Diabetes mellitus   . Hypertension   . Hyperlipidemia   . CAD (coronary artery disease)     s/p CABG s/p AO valve [place] ([place]  . Recurrent UTI   . Paroxysmal atrial fibrillation     cards d/c coumadin 12/2008 d/t persisten NSR and frequent falls- restarted coumadin july 2011  . Hyperthyroidism   . Dry skin   . Osteoarthritis   . Osteopenia     per dexa 12/09  . Keloid     @ chest  . Dizziness     Chronic, admiet 07-2010,saw neuro, thought to be a peripheral issue   . Anxiety 11/20/2011    Past Surgical History  Procedure  Laterality Date  . Abdominal hysterectomy    . Total knee arthroplasty  1999  . Oophorectomy    . Cagb  1998  . Cesarean section      x 2  . Hemorrhoid surgery     Social History:   Divorced x many years.   Lives by herself, has a life alert   Still drives  a little she has 2 Daughters they live in [place]   Tobacco-- quit 2004 apros, used to smoke 1.5 ppd   ETOH--no  Family History  Problem Relation Age of Onset  . Colon cancer Neg Hx   . Breast cancer Neg Hx   . Coronary artery disease Son 35    deceased  . Heart attack Father   . Hypertension Brother   . Stroke Brother   . Thyroid disease Neg Hx      Review of Systems Denies chest pain or shortness or breath No nausea, vomiting, diarrhea. No dysuria or gross hematuria.     Objective:   Physical Exam  BP 199/72  Pulse 71  Temp(Src) 97.9 F (36.6 C)  Ht 5' 2.1" (1.577 m)  Wt 177 lb 6.4 oz (80.468 kg)  BMI 32.36 kg/m2  SpO2 99% General -- alert, well-developed, NAD.  Neck --no thyromegaly Lungs -- normal respiratory effort, no intercostal retractions, no accessory muscle use, and normal breath sounds.  Heart--  normal rate, regular rhythm, soft syst  murmur.  Abdomen-- Not distended, good bowel sounds,soft, non-tender.  Extremities-- no pretibial edema bilaterally  Neurologic-- alert & oriented X3. Speech, uses a walker, seems steady w/ walker. Psych-- Cognition and judgment appear intact. Alert and cooperative with normal attention span and concentration. not anxious appearing and not depressed appearing.       Assessment & Plan:

## 2013-02-16 NOTE — Patient Instructions (Signed)
Get your blood work before you leave  Next visit in 3 months  for a routine office visit Please make an appointment  ---- Amlodipine take a whole tablet every day Benazepril 1/2 tablet a day Check the  blood pressure 2 or 3 times a week, be sure it is between 110/60 and 140/85. If it is consistently higher or lower, let me know ---     Fall Prevention and Home Safety Falls cause injuries and can affect all age groups. It is possible to use preventive measures to significantly decrease the likelihood of falls. There are many simple measures which can make your home safer and prevent falls. OUTDOORS  Repair cracks and edges of walkways and driveways.  Remove high doorway thresholds.  Trim shrubbery on the main path into your home.  Have good outside lighting.  Clear walkways of tools, rocks, debris, and clutter.  Check that handrails are not broken and are securely fastened. Both sides of steps should have handrails.  Have leaves, snow, and ice cleared regularly.  Use sand or salt on walkways during winter months.  In the garage, clean up grease or oil spills. BATHROOM  Install night lights.  Install grab bars by the toilet and in the tub and shower.  Use non-skid mats or decals in the tub or shower.  Place a plastic non-slip stool in the shower to sit on, if needed.  Keep floors dry and clean up all water on the floor immediately.  Remove soap buildup in the tub or shower on a regular basis.  Secure bath mats with non-slip, double-sided rug tape.  Remove throw rugs and tripping hazards from the floors. BEDROOMS  Install night lights.  Make sure a bedside light is easy to reach.  Do not use oversized bedding.  Keep a telephone by your bedside.  Have a firm chair with side arms to use for getting dressed.  Remove throw rugs and tripping hazards from the floor. KITCHEN  Keep handles on pots and pans turned toward the center of the stove. Use back burners  when possible.  Clean up spills quickly and allow time for drying.  Avoid walking on wet floors.  Avoid hot utensils and knives.  Position shelves so they are not too high or low.  Place commonly used objects within easy reach.  If necessary, use a sturdy step stool with a grab bar when reaching.  Keep electrical cables out of the way.  Do not use floor polish or wax that makes floors slippery. If you must use wax, use non-skid floor wax.  Remove throw rugs and tripping hazards from the floor. STAIRWAYS  Never leave objects on stairs.  Place handrails on both sides of stairways and use them. Fix any loose handrails. Make sure handrails on both sides of the stairways are as long as the stairs.  Check carpeting to make sure it is firmly attached along stairs. Make repairs to worn or loose carpet promptly.  Avoid placing throw rugs at the top or bottom of stairways, or properly secure the rug with carpet tape to prevent slippage. Get rid of throw rugs, if possible.  Have an electrician put in a light switch at the top and bottom of the stairs. OTHER FALL PREVENTION TIPS  Wear low-heel or rubber-soled shoes that are supportive and fit well. Wear closed toe shoes.  When using a stepladder, make sure it is fully opened and both spreaders are firmly locked. Do not climb a closed stepladder.  Add color or contrast paint or tape to grab bars and handrails in your home. Place contrasting color strips on first and last steps.  Learn and use mobility aids as needed. Install an electrical emergency response system.  Turn on lights to avoid dark areas. Replace light bulbs that burn out immediately. Get light switches that glow.  Arrange furniture to create clear pathways. Keep furniture in the same place.  Firmly attach carpet with non-skid or double-sided tape.  Eliminate uneven floor surfaces.  Select a carpet pattern that does not visually hide the edge of steps.  Be aware of  all pets. OTHER HOME SAFETY TIPS  Set the water temperature for 120 F (48.8 C).  Keep emergency numbers on or near the telephone.  Keep smoke detectors on every level of the home and near sleeping areas. Document Released: 05/17/2002 Document Revised: 11/26/2011 Document Reviewed: 08/16/2011 [facility] Patient Information 2014 [place], [place].

## 2013-02-16 NOTE — Assessment & Plan Note (Signed)
Well-controlled with Xanax as needed UDS low risk 10/2012

## 2013-02-16 NOTE — Assessment & Plan Note (Signed)
Last TSH was slightly suppressed, check TSH, free T3-4

## 2013-02-16 NOTE — Assessment & Plan Note (Signed)
Seems well-controlled, check a  A1c

## 2013-02-16 NOTE — Assessment & Plan Note (Addendum)
Patient reports she is taking only half amlodipine and half benazepril, BP today elevated upon arrival , BP was rechecked and it was still 195/87. Plan: To take a whole amlodipine, will continue with half benazepril Self BP monitoring. See instructions. Followup in 3 months.

## 2013-02-16 NOTE — Assessment & Plan Note (Signed)
Check a urinalysis.

## 2013-02-18 ENCOUNTER — Encounter: Payer: Self-pay | Admitting: *Deleted

## 2013-02-18 ENCOUNTER — Other Ambulatory Visit: Payer: Self-pay | Admitting: Internal Medicine

## 2013-02-18 ENCOUNTER — Telehealth: Payer: Self-pay | Admitting: *Deleted

## 2013-02-18 DIAGNOSIS — E0590 Thyrotoxicosis, unspecified without thyrotoxic crisis or storm: Secondary | ICD-10-CM

## 2013-02-18 NOTE — Telephone Encounter (Signed)
Message copied by Eustace Quail on Thu Feb 18, 2013  5:16 PM ------      Message from: Willow Ora E      Created: Thu Feb 18, 2013  4:25 PM       TSH continue to be suppressed .      Advise patient:      Diabetes is well controlled.      Thyroid continued to slightly overworking, please arrange a visit to see endocrinology, Dr. Everardo All. ------

## 2013-02-18 NOTE — Telephone Encounter (Signed)
Pt notified via telephone. DJR  

## 2013-02-18 NOTE — Telephone Encounter (Signed)
rx refilled per protocol. DJR  

## 2013-02-19 NOTE — Addendum Note (Signed)
Addended by: Willow Ora E on: 02/19/2013 06:04 PM   Modules accepted: Orders

## 2013-03-02 ENCOUNTER — Encounter: Payer: Self-pay | Admitting: Endocrinology

## 2013-03-02 ENCOUNTER — Ambulatory Visit (INDEPENDENT_AMBULATORY_CARE_PROVIDER_SITE_OTHER): Payer: Medicare Other | Admitting: Endocrinology

## 2013-03-02 VITALS — BP 160/100 | HR 78 | Wt 176.0 lb

## 2013-03-02 DIAGNOSIS — E0590 Thyrotoxicosis, unspecified without thyrotoxic crisis or storm: Secondary | ICD-10-CM

## 2013-03-02 DIAGNOSIS — Z23 Encounter for immunization: Secondary | ICD-10-CM

## 2013-03-02 MED ORDER — METHIMAZOLE 5 MG PO TABS: 5.0000 mg | ORAL_TABLET | Freq: Every day | ORAL | Status: DC

## 2013-03-02 NOTE — Patient Instructions (Signed)
i have sent a prescription to your pharmacy, to slow down the thyroid.   if ever you have fever while taking methimazole, stop it and call us, because of the risk of a rare side-effect.   Please come back for a follow-up appointment in 1 month.    

## 2013-03-02 NOTE — Progress Notes (Signed)
Subjective:    Patient ID: Rebecca Skinner, female    DOB: 06-10-1923, 79 y.o.   MRN: [id number]  HPI pt was dx'ed with hyperthyroidism, due to multinodular goiter, in 2008.  she was rx'ed tapazole.  due to increasing tsh, this was reduced, then stopped altogether in 2011.  In early 2012, hyperthyroidism recurred.  The plan was for scanning, then i-131 rx, but a repeat TSH was normal.  The TSH did not go low again until mid-2014.  Pt reports slight swelling at the anterior neck, but no assoc pain. Past Medical History  Diagnosis Date  . Asthma   . Diabetes mellitus   . Hypertension   . Hyperlipidemia   . CAD (coronary artery disease)     s/p CABG s/p AO valve replacement ([facility] [place] [facility])  . Recurrent UTI   . Paroxysmal atrial fibrillation     cards d/c coumadin 12/2008 d/t persisten NSR and frequent falls- restarted coumadin july 2011  . Hyperthyroidism   . Dry skin   . Osteoarthritis   . Osteopenia     per dexa 12/09  . Keloid     @ chest  . Dizziness     Chronic, admiet 07-2010,saw neuro, thought to be a peripheral issue   . Anxiety 11/20/2011    Past Surgical History  Procedure Laterality Date  . Abdominal hysterectomy    . Total knee arthroplasty  1999  . Oophorectomy    . Cagb  1998  . Cesarean section      x 2  . Hemorrhoid surgery      History   Social History  . Marital Status: Divorced    Spouse Name: N/A    Number of Children: N/A  . Years of Education: N/A   Occupational History  . Not on file.   Social History Main Topics  . Smoking status: Former Games developer  . Smokeless tobacco: Not on file  . Alcohol Use: No  . Drug Use: No  . Sexual Activity: Not on file   Other Topics Concern  . Not on file   Social History Narrative   Lives by herself, has life alert--- started back driving --- have 3 daughter and 1 son  (lost oldest daughter and son),2  Daughters live  in [place]    Current Outpatient Prescriptions on File Prior to Visit  Medication Sig  Dispense Refill  . albuterol (VENTOLIN HFA) 108 (90 BASE) MCG/ACT inhaler Inhale 2 puffs into the lungs every 6 (six) hours as needed for wheezing.  1 Inhaler  1  . ALPRAZolam (XANAX) 0.5 MG tablet TAKE 1 TABLET BY MOUTH 3 TIMES A DAY AS NEEDED  60 tablet  3  . amLODipine (NORVASC) 5 MG tablet Take 5 mg by mouth daily.        Marland Kitchen aspirin 81 MG tablet Take 81 mg by mouth daily.        Marland Kitchen atorvastatin (LIPITOR) 40 MG tablet TAKE 1 TABLET BY MOUTH AT BEDTIME  30 tablet  6  . benazepril (LOTENSIN) 20 MG tablet Take 10 mg by mouth daily.       . Calcium Carbonate (CALCIUM 500 PO) Take 500 mg by mouth daily.      . cholecalciferol (VITAMIN D) 1000 UNITS tablet Take 1,000 Units by mouth daily.        . clopidogrel (PLAVIX) 75 MG tablet Take 75 mg by mouth daily.      . cyclobenzaprine (FLEXERIL) 10 MG tablet TAKE 1 TABLET AT  BEDTIME AS NEEDED  30 tablet  1  . fluocinonide cream (LIDEX) 0.05 % USED AS DIRECTED.  30 g  0  . furosemide (LASIX) 20 MG tablet Take 20 mg by mouth daily.        Marland Kitchen glucose blood test strip Check once daily as directed.  100 each  11  . JANUVIA 50 MG tablet TAKE 1 TABLET BY MOUTH EVERY DAY  30 tablet  6  . metoprolol tartrate (LOPRESSOR) 25 MG tablet TAKE 1 TABLET (25 MG TOTAL) BY MOUTH 3 (THREE) TIMES DAILY.  90 tablet  5  . NEXIUM 40 MG capsule TAKE 1 CAPSULE (40 MG TOTAL) BY MOUTH DAILY BEFORE BREAKFAST.  30 capsule  5  . nitroGLYCERIN (NITROSTAT) 0.3 MG SL tablet Place 1 tablet (0.3 mg total) under the tongue every 5 (five) minutes as needed.  30 tablet  3  . ONE TOUCH LANCETS MISC Check once daily as directed.  200 each  11  . [DISCONTINUED] potassium chloride (KLOR-CON 10) 10 MEQ CR tablet Take 1 tablet (10 mEq total) by mouth daily.  10 tablet  0   No current facility-administered medications on file prior to visit.    Allergies  Allergen Reactions  . Hydrocodone     REACTION: itching/nausea  . Tramadol Hcl     REACTION: itching , upset stomach (08-2009)    Family  History  Problem Relation Age of Onset  . Colon cancer Neg Hx   . Breast cancer Neg Hx   . Coronary artery disease Son 53    deceased  . Heart attack Father   . Hypertension Brother   . Stroke Brother   . Thyroid disease Neg Hx     There were no vitals taken for this visit.  Review of Systems denies weight loss, headache, and hoarseness    Objective:   Physical Exam VS: see vs page GEN: no distress NECK: small multinodular goiter.   Lab Results  Component Value Date   TSH 0.15* 02/16/2013      Assessment & Plan:  Multinodular goiter: usually hereditary Hyperthyroidism, due to the goiter: we discussed the causes, risks, and treatment options.  She chooses tapazole. AF: in this setting, she should be treated to euthyroidism

## 2013-03-03 DIAGNOSIS — Z23 Encounter for immunization: Secondary | ICD-10-CM

## 2013-03-26 ENCOUNTER — Ambulatory Visit (HOSPITAL_BASED_OUTPATIENT_CLINIC_OR_DEPARTMENT_OTHER)
Admission: RE | Admit: 2013-03-26 | Discharge: 2013-03-26 | Disposition: A | Discharge status: Home / Self Care | Payer: Medicare Other | Source: Ambulatory Visit | Attending: Internal Medicine | Admitting: Internal Medicine

## 2013-03-26 ENCOUNTER — Ambulatory Visit (INDEPENDENT_AMBULATORY_CARE_PROVIDER_SITE_OTHER): Payer: Medicare Other | Admitting: Internal Medicine

## 2013-03-26 ENCOUNTER — Emergency Department (HOSPITAL_BASED_OUTPATIENT_CLINIC_OR_DEPARTMENT_OTHER): Admission: EM | Admit: 2013-03-26 | Payer: Medicare Other | Source: Home / Self Care

## 2013-03-26 ENCOUNTER — Encounter (HOSPITAL_BASED_OUTPATIENT_CLINIC_OR_DEPARTMENT_OTHER): Payer: Self-pay | Admitting: Emergency Medicine

## 2013-03-26 VITALS — BP 177/81 | HR 100 | Temp 98.2°F | Wt 176.0 lb

## 2013-03-26 DIAGNOSIS — I10 Essential (primary) hypertension: Secondary | ICD-10-CM

## 2013-03-26 DIAGNOSIS — R42 Dizziness and giddiness: Secondary | ICD-10-CM

## 2013-03-26 DIAGNOSIS — M549 Dorsalgia, unspecified: Secondary | ICD-10-CM

## 2013-03-26 DIAGNOSIS — W19XXXA Unspecified fall, initial encounter: Secondary | ICD-10-CM | POA: Insufficient documentation

## 2013-03-26 DIAGNOSIS — M5137 Other intervertebral disc degeneration, lumbosacral region: Secondary | ICD-10-CM | POA: Insufficient documentation

## 2013-03-26 DIAGNOSIS — M51379 Other intervertebral disc degeneration, lumbosacral region without mention of lumbar back pain or lower extremity pain: Secondary | ICD-10-CM | POA: Insufficient documentation

## 2013-03-26 MED ORDER — MECLIZINE HCL 12.5 MG PO TABS: 12.5000 mg | ORAL_TABLET | Freq: Three times a day (TID) | ORAL | Status: DC | PRN

## 2013-03-26 MED ORDER — ALBUTEROL SULFATE HFA 108 (90 BASE) MCG/ACT IN AERS: 2.0000 | INHALATION_SPRAY | Freq: Four times a day (QID) | RESPIRATORY_TRACT | Status: DC | PRN

## 2013-03-26 NOTE — Progress Notes (Signed)
Subjective:    Patient ID: Rebecca Skinner, female    DOB: 10/03/1922, 79 y.o.   MRN: [id number]  HPI Acute visit, here with her daughter A week ago,Was going to the bathroom at 1 AM, she step wrong and fell. Landed on her bottom, no lost of consciousness or syncope, no neck pain, since then her mild chronic back pain is worse, pain is located at the lower back bilaterally with some radiation to the right buttock.  BP is noted to be elevated, reports good compliance of medication,   ambulatory BPs reportedly elevated as well  but no actual readings, BP at endocrinology it was   166/100.  dizzines --needs a refill onAntivert  Past Medical History  Diagnosis Date  . Asthma   . Diabetes mellitus   . Hypertension   . Hyperlipidemia   . CAD (coronary artery disease)     s/p CABG s/p AO valve replacement ([place] [facility])  . Recurrent UTI   . Paroxysmal atrial fibrillation     cards d/c coumadin 12/2008 d/t persisten NSR and frequent falls- restarted coumadin july 2011  . Hyperthyroidism   . Dry skin   . Osteoarthritis   . Osteopenia     per dexa 12/09  . Keloid     @ chest  . Dizziness     Chronic, admiet 07-2010,saw neuro, thought to be a peripheral issue   . Anxiety 11/20/2011   Past Surgical History  Procedure Laterality Date  . Abdominal hysterectomy    . Total knee arthroplasty  1999  . Oophorectomy    . Cagb  1998  . Cesarean section      x 2  . Hemorrhoid surgery     History   Social History  . Marital Status: Divorced    Spouse Name: N/A    Number of Children: 4  . Years of Education: N/A   Occupational History  . retired     Social History Main Topics  . Smoking status: Former Games developer  . Smokeless tobacco: Not on file     Comment: quit 2004, smoked 1.5 ppd  . Alcohol Use: No  . Drug Use: No  . Sexual Activity: Not on file   Other Topics Concern  . Not on file   Social History Narrative   Lives by herself, has life alert   Had 3 daughter- 1 son  (lost  oldest daughter and son), 2 living daughters in [place]     Review of Systems Denies bladder or bowel incontinence No dysuria or gross hematuria No nausea vomiting Mild generalized itching, not a new problem, the only new medication she takes is Tapazole    Objective:   Physical Exam  Skin:      BP 177/81  Pulse 100  Temp(Src) 98.2 F (36.8 C)  Wt 176 lb (79.833 kg)  BMI 32.1 kg/m2  SpO2 98%  General -- alert, well-developed, NAD.  Extremities-- no pretibial edema bilaterally  Neurologic--  alert & oriented X3. Speech normal, gait Limited by generalized weakness and pain, strength symmetric  in all extremities.  DTRs symmetric  Straight leg test negative Psych-- Cognition and judgment appear intact. Cooperative with normal attention span and concentration. No anxious appearing , no depressed appearing.      Assessment & Plan:  Back pain, Status post a fall, increased back pain since, plan: Ice 2 times a day Continue Tylenol X-ray to rule out a fracture I am hesitant to prescribe stronger pain medication due  to potential to increase falls. Reassess in 2 weeks

## 2013-03-26 NOTE — Assessment & Plan Note (Signed)
Request a refill on Antivert, will do

## 2013-03-26 NOTE — Assessment & Plan Note (Signed)
BP not well controlled. Plan: Increase amlodipine from 5 mg to 10 mg Increased denies from 10 mg to 20 mg See instructions

## 2013-03-26 NOTE — ED Provider Notes (Signed)
Patient has not seen in the emergency department. She actually checked in by mistake as an ER patient versus outpatient. She was supposed to come over here to get an outpatient x-ray that was ordered by her primary care physician. She did not want to be seen as a patient in the emergency room.  Rolan Bucco, MD 03/26/13 1739

## 2013-03-26 NOTE — Patient Instructions (Signed)
Get the XR at [facility], corner of [address] (10 minutes form here); they are open 24/7 [address]  [place], [place] [address] [phone]  Increase amlodipine 10 mg from half tablet to one tablet Increase Lotensin to 20 mg from half tablet to one tablet  Check the  blood pressure 2 or 3 times a  week be sure it is between 110/60 and 140/85. Ideal blood pressure is 120/80. If it is consistently higher or lower, let me know.  Tylenol  500 mg OTC 2 tabs a day every 8 hours as needed for pain   Next visit in 2 weeks for a recheck

## 2013-03-26 NOTE — ED Notes (Addendum)
Patient states one week ago, she was sitting on the side of her bed, slipped off the bed down to the floor.  States she has increased back pain and is worse on the right side.  Patient states she was seen today by Dr. Drue Novel, and sent to the ED for further evaluation.

## 2013-03-27 ENCOUNTER — Encounter: Payer: Self-pay | Admitting: Internal Medicine

## 2013-04-01 ENCOUNTER — Encounter: Payer: Self-pay | Admitting: Endocrinology

## 2013-04-01 ENCOUNTER — Ambulatory Visit (INDEPENDENT_AMBULATORY_CARE_PROVIDER_SITE_OTHER): Payer: Medicare Other | Admitting: Endocrinology

## 2013-04-01 VITALS — BP 126/74 | HR 79 | Wt 175.0 lb

## 2013-04-01 DIAGNOSIS — E0590 Thyrotoxicosis, unspecified without thyrotoxic crisis or storm: Secondary | ICD-10-CM

## 2013-04-01 LAB — TSH: TSH: 1.16 u[IU]/mL (ref 0.35–5.50)

## 2013-04-01 NOTE — Progress Notes (Signed)
Subjective:    Patient ID: Rebecca Skinner, female    DOB: 01/01/1923, 78 y.o.   MRN: [id number]  HPI pt was dx'ed with hyperthyroidism, due to multinodular goiter, in 2008.  she was rx'ed tapazole.  due to increasing tsh, this was reduced, then stopped altogether in 2011.  In early 2012, hyperthyroidism recurred.  The plan was for scanning, then i-131 rx, but a repeat TSH was normal.  The TSH did not go low again until mid-2014. She was rx'ed tapazole, since then, she has palpitations in the chest. Past Medical History  Diagnosis Date  . Asthma   . Diabetes mellitus   . Hypertension   . Hyperlipidemia   . CAD (coronary artery disease)     s/p CABG s/p AO valve replacement ([facility] [place] [facility]  . Recurrent UTI   . Paroxysmal atrial fibrillation     cards d/c coumadin 12/2008 d/t persisten NSR and frequent falls- restarted coumadin july 2011  . Hyperthyroidism   . Dry skin   . Osteoarthritis   . Osteopenia     per dexa 12/09  . Keloid     @ chest  . Dizziness     Chronic, admiet 07-2010,saw neuro, thought to be a peripheral issue   . Anxiety 11/20/2011    Past Surgical History  Procedure Laterality Date  . Abdominal hysterectomy    . Total knee arthroplasty  1999  . Oophorectomy    . Cagb  1998  . Cesarean section      x 2  . Hemorrhoid surgery      History   Social History  . Marital Status: Divorced    Spouse Name: N/A    Number of Children: 4  . Years of Education: N/A   Occupational History  . retired     Social History Main Topics  . Smoking status: Former Games developer  . Smokeless tobacco: Not on file     Comment: quit 2004, smoked 1.5 ppd  . Alcohol Use: No  . Drug Use: No  . Sexual Activity: Not on file   Other Topics Concern  . Not on file   Social History Narrative   Lives by herself, has life alert   Had 3 daughter- 1 son  (lost oldest daughter and son), 2 living daughters in [place]    Current Outpatient Prescriptions on File Prior to Visit   Medication Sig Dispense Refill  . albuterol (VENTOLIN HFA) 108 (90 BASE) MCG/ACT inhaler Inhale 2 puffs into the lungs every 6 (six) hours as needed for wheezing.  1 Inhaler  3  . ALPRAZolam (XANAX) 0.5 MG tablet TAKE 1 TABLET BY MOUTH 3 TIMES A DAY AS NEEDED  60 tablet  3  . amLODipine (NORVASC) 5 MG tablet Take 10 mg by mouth daily.       Marland Kitchen aspirin 81 MG tablet Take 81 mg by mouth daily.        Marland Kitchen atorvastatin (LIPITOR) 40 MG tablet TAKE 1 TABLET BY MOUTH AT BEDTIME  30 tablet  6  . benazepril (LOTENSIN) 20 MG tablet Take 20 mg by mouth daily.       . Calcium Carbonate (CALCIUM 500 PO) Take 500 mg by mouth daily.      . cholecalciferol (VITAMIN D) 1000 UNITS tablet Take 1,000 Units by mouth daily.        . clopidogrel (PLAVIX) 75 MG tablet Take 75 mg by mouth daily.      . cyclobenzaprine (FLEXERIL) 10 MG tablet  TAKE 1 TABLET AT BEDTIME AS NEEDED  30 tablet  1  . fluocinonide cream (LIDEX) 0.05 % USED AS DIRECTED.  30 g  0  . furosemide (LASIX) 20 MG tablet Take 20 mg by mouth daily.        Marland Kitchen glucose blood test strip Check once daily as directed.  100 each  11  . JANUVIA 50 MG tablet TAKE 1 TABLET BY MOUTH EVERY DAY  30 tablet  6  . meclizine (ANTIVERT) 12.5 MG tablet Take 1 tablet (12.5 mg total) by mouth 3 (three) times daily as needed.  30 tablet  0  . metoprolol tartrate (LOPRESSOR) 25 MG tablet TAKE 1 TABLET (25 MG TOTAL) BY MOUTH 3 (THREE) TIMES DAILY.  90 tablet  5  . NEXIUM 40 MG capsule TAKE 1 CAPSULE (40 MG TOTAL) BY MOUTH DAILY BEFORE BREAKFAST.  30 capsule  5  . nitroGLYCERIN (NITROSTAT) 0.3 MG SL tablet Place 1 tablet (0.3 mg total) under the tongue every 5 (five) minutes as needed.  30 tablet  3  . ONE TOUCH LANCETS MISC Check once daily as directed.  200 each  11  . [DISCONTINUED] potassium chloride (KLOR-CON 10) 10 MEQ CR tablet Take 1 tablet (10 mEq total) by mouth daily.  10 tablet  0   No current facility-administered medications on file prior to visit.    Allergies   Allergen Reactions  . Hydrocodone     REACTION: itching/nausea  . Tramadol Hcl     REACTION: itching , upset stomach (08-2009)    Family History  Problem Relation Age of Onset  . Colon cancer Neg Hx   . Breast cancer Neg Hx   . Coronary artery disease Son 26    deceased  . Heart attack Father   . Hypertension Brother   . Stroke Brother   . Thyroid disease Neg Hx     BP 126/74  Pulse 79  Wt 175 lb (79.379 kg)  BMI 32 kg/m2  SpO2 97%  Review of Systems Denies fever.     Objective:   Physical Exam VITAL SIGNS:  See vs page GENERAL: no distress NECK: small multinodular goiter.   Skin: not diaphoretic Neuro: no tremor   Lab Results  Component Value Date   TSH 1.16 04/01/2013      Assessment & Plan:  Hyperthyroidism, well-controlled

## 2013-04-01 NOTE — Patient Instructions (Addendum)
blood tests are being requested for you today.  We'll contact you with results.   if ever you have fever while taking methimazole, stop it and call us, because of the risk of a rare side-effect Please come back for a follow-up appointment in 6 weeks.    

## 2013-04-08 ENCOUNTER — Telehealth: Payer: Self-pay | Admitting: *Deleted

## 2013-04-08 ENCOUNTER — Other Ambulatory Visit: Payer: Self-pay | Admitting: Internal Medicine

## 2013-04-08 MED ORDER — ALPRAZOLAM 0.5 MG PO TABS: ORAL_TABLET | ORAL | Status: DC

## 2013-04-08 NOTE — Addendum Note (Signed)
Addended by: Willow Ora E on: 04/08/2013 01:29 PM   Modules accepted: Orders

## 2013-04-08 NOTE — Telephone Encounter (Signed)
Done

## 2013-04-08 NOTE — Telephone Encounter (Signed)
rx request refill- Xanax 0.5mg  Last OV - 03/26/13 Last refilled- 03/09/12 #60 / 3 rf  UDS LOW- 10/14/12  Please advise . DJR

## 2013-04-09 ENCOUNTER — Encounter: Payer: Self-pay | Admitting: Internal Medicine

## 2013-04-09 ENCOUNTER — Ambulatory Visit (INDEPENDENT_AMBULATORY_CARE_PROVIDER_SITE_OTHER): Payer: Medicare Other | Admitting: Internal Medicine

## 2013-04-09 VITALS — BP 176/74 | HR 77 | Temp 98.6°F | Wt 176.2 lb

## 2013-04-09 DIAGNOSIS — M549 Dorsalgia, unspecified: Secondary | ICD-10-CM

## 2013-04-09 DIAGNOSIS — E119 Type 2 diabetes mellitus without complications: Secondary | ICD-10-CM

## 2013-04-09 DIAGNOSIS — I10 Essential (primary) hypertension: Secondary | ICD-10-CM

## 2013-04-09 LAB — BASIC METABOLIC PANEL
BUN: 14 mg/dL (ref 6–23)
Calcium: 9.8 mg/dL (ref 8.4–10.5)
Creat: 1.03 mg/dL (ref 0.50–1.10)
Glucose, Bld: 93 mg/dL (ref 70–99)
Potassium: 3.6 mEq/L (ref 3.5–5.3)

## 2013-04-09 MED ORDER — ATORVASTATIN CALCIUM 40 MG PO TABS: ORAL_TABLET | ORAL | Status: DC

## 2013-04-09 NOTE — Patient Instructions (Addendum)
Get your blood work before you leave  Next visit in 3 - 4 months for a diabetes hypertension   follow up (30 minutes) No Fasting Please make an appointment     Check the  blood pressure 2 or 3 times a week be sure it is between 110/60 and 140/85. Ideal blood pressure is 120/80. If it is consistently higher or lower, let me know

## 2013-04-09 NOTE — Progress Notes (Signed)
  Subjective:    Patient ID: Rebecca Skinner, female    DOB: 02-13-1923, 79 y.o.   MRN: [id number]  HPI Followup Since the last time she was here, no further falls. Back pain--well-controlled with Tylenol and an ice pack. Hypertension,   better, ambulatory BPs in the range of 120, 140. Ambulatory blood sugars in the low 100s.  Past Medical History  Diagnosis Date  . Asthma   . Diabetes mellitus   . Hypertension   . Hyperlipidemia   . CAD (coronary artery disease)     s/p CABG s/p AO valve replacement ([place] [facility])  . Recurrent UTI   . Paroxysmal atrial fibrillation     cards d/c coumadin 12/2008 d/t persisten NSR and frequent falls- restarted coumadin july 2011  . Hyperthyroidism   . Dry skin   . Osteoarthritis   . Osteopenia     per dexa 12/09  . Keloid     @ chest  . Dizziness     Chronic, admiet 07-2010,saw neuro, thought to be a peripheral issue   . Anxiety 11/20/2011   Past Surgical History  Procedure Laterality Date  . Abdominal hysterectomy    . Total knee arthroplasty  1999  . Oophorectomy    . Cagb  1998  . Cesarean section      x 2  . Hemorrhoid surgery     History   Social History  . Marital Status: Divorced    Spouse Name: N/A    Number of Children: 4  . Years of Education: N/A   Occupational History  . retired     Social History Main Topics  . Smoking status: Former Games developer  . Smokeless tobacco: Not on file     Comment: quit 2004, smoked 1.5 ppd  . Alcohol Use: No  . Drug Use: No  . Sexual Activity: Not on file   Other Topics Concern  . Not on file   Social History Narrative   Lives by herself, has life alert   Had 3 daughter- 1 son  (lost oldest daughter and son), 2 living daughters in [place]      Review of Systems No syncope, no headaches, fever or chills. No dizziness, feeling somewhat weak but not far from baseline.     Objective:   Physical Exam  BP 176/74  Pulse 77  Temp(Src) 98.6 F (37 C)  Wt 176 lb 3.2 oz (79.924  kg)  BMI 32.22 kg/m2  SpO2 96% General -- alert, well-developed, NAD.  Lungs -- normal respiratory effort, no intercostal retractions, no accessory muscle use, and normal breath sounds.  Heart-- normal rate, regular rhythm, no murmur.   Extremities-- no pretibial edema bilaterally  Neurologic--  alert & oriented X3. Speech normal, gait normal, strength normal in all extremities.  Psych-- Cognition and judgment appear intact. Cooperative with normal attention span and concentration. No anxious appearing , no depressed appearing.      Assessment & Plan:  Back pain, X-ray was negative for acute changes , pain well-controlled with Tylenol and ice, patient is unable to take stronger pain medication. Plan: Continue current care.

## 2013-04-09 NOTE — Assessment & Plan Note (Signed)
Taking only 5 mg of  amlodipine , BP is well-controlled. No change, check a BMP.

## 2013-04-09 NOTE — Assessment & Plan Note (Signed)
Ambulatory blood sugars satisfactorily .

## 2013-04-10 ENCOUNTER — Encounter: Payer: Self-pay | Admitting: Internal Medicine

## 2013-04-12 ENCOUNTER — Encounter: Payer: Self-pay | Admitting: *Deleted

## 2013-05-13 ENCOUNTER — Ambulatory Visit (INDEPENDENT_AMBULATORY_CARE_PROVIDER_SITE_OTHER): Payer: Medicare Other | Admitting: Endocrinology

## 2013-05-13 VITALS — BP 122/82 | HR 51 | Temp 97.6°F | Ht 62.0 in | Wt 173.0 lb

## 2013-05-13 DIAGNOSIS — E0590 Thyrotoxicosis, unspecified without thyrotoxic crisis or storm: Secondary | ICD-10-CM

## 2013-05-13 LAB — TSH: TSH: 1.16 u[IU]/mL (ref 0.35–5.50)

## 2013-05-13 LAB — T4, FREE: Free T4: 0.64 ng/dL (ref 0.60–1.60)

## 2013-05-13 NOTE — Progress Notes (Signed)
Subjective:    Patient ID: Rebecca Skinner, female    DOB: 1923/04/22, 79 y.o.   MRN: [id number]  HPI pt was dx'ed with hyperthyroidism, due to multinodular goiter, in 2008.  Tapazole was chosen as initial rx, due to AF.  due to increasing tsh, this was reduced, then stopped altogether in 2011.  In early 2012, hyperthyroidism recurred.  The plan was for scanning, then i-131 rx, but a repeat TSH was normal.  The TSH did not go low again until mid-2014. She chose to resume tapazole.  In October of 2014, tapazole was reduced, due to normalization of her TFT.  pt states she feels well in general, except for intermittent palpitations.   Past Medical History  Diagnosis Date  . Asthma   . Diabetes mellitus   . Hypertension   . Hyperlipidemia   . CAD (coronary artery disease)     s/p CABG s/p AO valve replacement ([place] [facility])  . Recurrent UTI   . Paroxysmal atrial fibrillation     cards d/c coumadin 12/2008 d/t persisten NSR and frequent falls- restarted coumadin july 2011  . Hyperthyroidism   . Dry skin   . Osteoarthritis   . Osteopenia     per dexa 12/09  . Keloid     @ chest  . Dizziness     Chronic, admiet 07-2010,saw neuro, thought to be a peripheral issue   . Anxiety 11/20/2011    Past Surgical History  Procedure Laterality Date  . Abdominal hysterectomy    . Total knee arthroplasty  1999  . Oophorectomy    . Cagb  1998  . Cesarean section      x 2  . Hemorrhoid surgery      History   Social History  . Marital Status: Divorced    Spouse Name: N/A    Number of Children: 4  . Years of Education: N/A   Occupational History  . retired     Social History Main Topics  . Smoking status: Former Games developer  . Smokeless tobacco: Not on file     Comment: quit 2004, smoked 1.5 ppd  . Alcohol Use: No  . Drug Use: No  . Sexual Activity: Not on file   Other Topics Concern  . Not on file   Social History Narrative   Lives by herself, has life alert   Had 3 daughter- 1 son   (lost oldest daughter and son), 2 living daughters in [place]    Current Outpatient Prescriptions on File Prior to Visit  Medication Sig Dispense Refill  . albuterol (VENTOLIN HFA) 108 (90 BASE) MCG/ACT inhaler Inhale 2 puffs into the lungs every 6 (six) hours as needed for wheezing.  1 Inhaler  3  . ALPRAZolam (XANAX) 0.5 MG tablet TAKE 1 TABLET BY MOUTH 3 TIMES A DAY AS NEEDED  60 tablet  3  . amLODipine (NORVASC) 5 MG tablet Take 5 mg by mouth daily.       Marland Kitchen aspirin 81 MG tablet Take 81 mg by mouth daily.        Marland Kitchen atorvastatin (LIPITOR) 40 MG tablet TAKE 1 TABLET BY MOUTH AT BEDTIME  30 tablet  12  . benazepril (LOTENSIN) 20 MG tablet Take 20 mg by mouth daily.       . Calcium Carbonate (CALCIUM 500 PO) Take 500 mg by mouth daily.      . cholecalciferol (VITAMIN D) 1000 UNITS tablet Take 1,000 Units by mouth daily.        Marland Kitchen  clopidogrel (PLAVIX) 75 MG tablet Take 75 mg by mouth daily.      . cyclobenzaprine (FLEXERIL) 10 MG tablet TAKE 1 TABLET AT BEDTIME AS NEEDED  30 tablet  1  . fluocinonide cream (LIDEX) 0.05 % USED AS DIRECTED.  30 g  0  . furosemide (LASIX) 20 MG tablet Take 20 mg by mouth daily.        Marland Kitchen glucose blood test strip Check once daily as directed.  100 each  11  . JANUVIA 50 MG tablet TAKE 1 TABLET BY MOUTH EVERY DAY  30 tablet  6  . meclizine (ANTIVERT) 12.5 MG tablet Take 1 tablet (12.5 mg total) by mouth 3 (three) times daily as needed.  30 tablet  0  . methimazole (TAPAZOLE) 5 MG tablet 1 tab, 3 times a week (M, W, F)      . metoprolol tartrate (LOPRESSOR) 25 MG tablet TAKE 1 TABLET (25 MG TOTAL) BY MOUTH 3 (THREE) TIMES DAILY.  90 tablet  5  . NEXIUM 40 MG capsule TAKE 1 CAPSULE (40 MG TOTAL) BY MOUTH DAILY BEFORE BREAKFAST.  30 capsule  5  . nitroGLYCERIN (NITROSTAT) 0.3 MG SL tablet Place 1 tablet (0.3 mg total) under the tongue every 5 (five) minutes as needed.  30 tablet  3  . ONE TOUCH LANCETS MISC Check once daily as directed.  200 each  11  . [DISCONTINUED]  potassium chloride (KLOR-CON 10) 10 MEQ CR tablet Take 1 tablet (10 mEq total) by mouth daily.  10 tablet  0   No current facility-administered medications on file prior to visit.   Allergies  Allergen Reactions  . Hydrocodone     REACTION: itching/nausea  . Tramadol Hcl     REACTION: itching , upset stomach (08-2009)   Family History  Problem Relation Age of Onset  . Colon cancer Neg Hx   . Breast cancer Neg Hx   . Coronary artery disease Son 69    deceased  . Heart attack Father   . Hypertension Brother   . Stroke Brother   . Thyroid disease Neg Hx    BP 122/82  Pulse 51  Temp(Src) 97.6 F (36.4 C) (Oral)  Ht 5\' 2"  (1.575 m)  Wt 173 lb (78.472 kg)  BMI 31.63 kg/m2  SpO2 96%  Review of Systems Denies fever.      Objective:   Physical Exam VITAL SIGNS:  See vs page GENERAL: no distress. Skin: not diaphoretic Neuro: no tremor   Lab Results  Component Value Date   TSH 1.16 05/13/2013      Assessment & Plan:  Hyperthyroidism: well-controlled

## 2013-05-13 NOTE — Patient Instructions (Addendum)
blood tests are being requested for you today.  We'll contact you with results. if ever you have fever while taking methimazole, stop it and call us, because of the risk of a rare side-effect Please come back for a follow-up appointment in 2 months.    

## 2013-05-19 ENCOUNTER — Ambulatory Visit: Payer: Medicare Other | Admitting: Internal Medicine

## 2013-06-29 ENCOUNTER — Other Ambulatory Visit: Payer: Self-pay | Admitting: Cardiology

## 2013-07-01 ENCOUNTER — Other Ambulatory Visit: Payer: Self-pay | Admitting: Cardiology

## 2013-07-02 NOTE — Telephone Encounter (Signed)
Rx was sent to pharmacy electronically. 

## 2013-07-09 ENCOUNTER — Other Ambulatory Visit: Payer: Self-pay | Admitting: Internal Medicine

## 2013-07-09 NOTE — Telephone Encounter (Signed)
Metoprolol refilled per protocol. JG//CMA 

## 2013-07-13 ENCOUNTER — Encounter: Payer: Self-pay | Admitting: Internal Medicine

## 2013-07-13 ENCOUNTER — Ambulatory Visit (INDEPENDENT_AMBULATORY_CARE_PROVIDER_SITE_OTHER): Payer: Medicare Other | Admitting: Internal Medicine

## 2013-07-13 VITALS — BP 122/69 | HR 88 | Temp 98.2°F | Wt 173.0 lb

## 2013-07-13 DIAGNOSIS — J45909 Unspecified asthma, uncomplicated: Secondary | ICD-10-CM

## 2013-07-13 DIAGNOSIS — I10 Essential (primary) hypertension: Secondary | ICD-10-CM

## 2013-07-13 DIAGNOSIS — E119 Type 2 diabetes mellitus without complications: Secondary | ICD-10-CM

## 2013-07-13 DIAGNOSIS — I4891 Unspecified atrial fibrillation: Secondary | ICD-10-CM

## 2013-07-13 LAB — HEMOGLOBIN A1C: Hgb A1c MFr Bld: 6.5 % (ref 4.6–6.5)

## 2013-07-13 MED ORDER — CLOPIDOGREL BISULFATE 75 MG PO TABS: 75.0000 mg | ORAL_TABLET | Freq: Every day | ORAL | Status: DC

## 2013-07-13 MED ORDER — BECLOMETHASONE DIPROPIONATE 80 MCG/ACT IN AERS: 2.0000 | INHALATION_SPRAY | Freq: Two times a day (BID) | RESPIRATORY_TRACT | Status: DC

## 2013-07-13 NOTE — Assessment & Plan Note (Signed)
Check a A1c 

## 2013-07-13 NOTE — Progress Notes (Signed)
   Subjective:    Patient ID: Rebecca Skinner, female    DOB: 1923-04-23, 80 y.o.   MRN: [id number]  HPI Here for a ROV States she is doing about the same, no new complaints except that she is more short of breath than few months ago. On further questioning, she admits to wheezing and is taking albuterol almost daily. Paroxysmal atrial fibrillation--Med list reviewed, has not taken Plavix Diabetes--good compliance of medications, no recent ambulatory CBGs  Past Medical History  Diagnosis Date  . Asthma   . Diabetes mellitus   . Hypertension   . Hyperlipidemia   . CAD (coronary artery disease)     s/p CABG s/p AO valve replacement ([facility] [facility])  . Recurrent UTI   . Paroxysmal atrial fibrillation     cards d/c coumadin 12/2008 d/t persisten NSR and frequent falls- restarted coumadin july 2011  . Hyperthyroidism   . Dry skin   . Osteoarthritis   . Osteopenia     per dexa 12/09  . Keloid     @ chest  . Dizziness     Chronic, admiet 07-2010,saw neuro, thought to be a peripheral issue   . Anxiety 11/20/2011   Past Surgical History  Procedure Laterality Date  . Abdominal hysterectomy    . Total knee arthroplasty  1999  . Oophorectomy    . Cagb  1998  . Cesarean section      x 2  . Hemorrhoid surgery     History   Social History  . Marital Status: Divorced    Spouse Name: N/A    Number of Children: 35  . Years of Education: N/A   Occupational History  . retired     Social History Main Topics  . Smoking status: Former Research scientist (life sciences)  . Smokeless tobacco: Not on file     Comment: quit 2004, smoked 1.5 ppd  . Alcohol Use: No  . Drug Use: No  . Sexual Activity: Not on file   Other Topics Concern  . Not on file   Social History Narrative   Lives by herself, has life alert   Had 3 daughter- 1 son  (lost oldest daughter and son), 2 living daughters in [place] Denies chest pain, occasional palpitations, could not tell me how often or for how long, not  associated with syncope or presyncope. No cough Occasional dizziness Occasional nausea without vomiting, diarrhea or blood in the stools.     Objective:   Physical Exam BP 122/69  Pulse 88  Temp(Src) 98.2 F (36.8 C)  Wt 173 lb (78.472 kg)  SpO2 97% General -- alert, well-developed, elderly female in no distress, frail appearing but at baseline   Lungs -- normal respiratory effort, no intercostal retractions, no accessory muscle use, and normal breath sounds.  Heart-- seems regular today  Extremities-- no pretibial edema bilaterally  Neurologic--  alert & oriented X3.   Psych-- Cognition and judgment appear intact. Cooperative with normal attention span and concentration. No anxious or depressed appearing.      Assessment & Plan:

## 2013-07-13 NOTE — Assessment & Plan Note (Signed)
Well-controlled, no change 

## 2013-07-13 NOTE — Progress Notes (Signed)
Pre visit review using our clinic review tool, if applicable. No additional management support is needed unless otherwise documented below in the visit note. 

## 2013-07-13 NOTE — Assessment & Plan Note (Signed)
Slightly more short of breath than before, admits to using and using albuterol more frequently. Plan: Add Qvar

## 2013-07-13 NOTE — Assessment & Plan Note (Addendum)
Off Coumadin since 08-2011, was supposed to be taking Plavix but she's not, reason? Occasional palpitations without associated symptoms, she seems to be in sinus rhythm today by physical exam Plan: Refill Plavix, needs to see her new cardiologist

## 2013-07-13 NOTE — Patient Instructions (Signed)
Get your blood work before you leave   Start taking Plavix one tablet daily  Start taking Qvar 2 puffs twice a day, if your breathing is not improving or you continue needing albuterol every day --- let us know Next visit is for routine check up   in 4 months  No need to come back fasting Please make an appointment

## 2013-07-14 ENCOUNTER — Ambulatory Visit (INDEPENDENT_AMBULATORY_CARE_PROVIDER_SITE_OTHER): Payer: Medicare Other | Admitting: Endocrinology

## 2013-07-14 ENCOUNTER — Encounter: Payer: Self-pay | Admitting: *Deleted

## 2013-07-14 ENCOUNTER — Encounter: Payer: Self-pay | Admitting: Endocrinology

## 2013-07-14 ENCOUNTER — Telehealth: Payer: Self-pay | Admitting: Internal Medicine

## 2013-07-14 VITALS — BP 114/70 | HR 83 | Temp 97.7°F | Ht 62.0 in | Wt 173.0 lb

## 2013-07-14 DIAGNOSIS — E0590 Thyrotoxicosis, unspecified without thyrotoxic crisis or storm: Secondary | ICD-10-CM

## 2013-07-14 NOTE — Patient Instructions (Signed)
A thyroid blood test is requested for you today.  We'll contact you with results.  if ever you have fever while taking methimazole, stop it and call us, because of the risk of a rare side-effect Please come back for a follow-up appointment in 3 months.

## 2013-07-14 NOTE — Progress Notes (Signed)
Subjective:    Patient ID: Rebecca Skinner, female    DOB: [birth year], 80 y.o.   MRN: [id number]  HPI pt was dx'ed with hyperthyroidism, due to multinodular goiter, in 2008.  Tapazole was chosen as initial rx, due to AF.  due to increasing tsh, this was reduced, then stopped altogether in 2011.  In early 2012, hyperthyroidism recurred.  The plan was for scanning, then i-131 rx, but a repeat TSH was normal.  The TSH did not go low again until mid-2014. She chose to resume tapazole.  In October of 2014, tapazole was reduced, due to normalization of her TFT.  pt states she feels well in general, except for intermittent palpitations.   Past Medical History  Diagnosis Date  . Asthma   . Diabetes mellitus   . Hypertension   . Hyperlipidemia   . CAD (coronary artery disease)     s/p CABG s/p AO valve replacement ([facility] [facility])  . Recurrent UTI   . Paroxysmal atrial fibrillation     cards d/c coumadin 12/2008 d/t persisten NSR and frequent falls- restarted coumadin july 2011  . Hyperthyroidism   . Dry skin   . Osteoarthritis   . Osteopenia     per dexa 12/09  . Keloid     @ chest  . Dizziness     Chronic, admiet 07-2010,saw neuro, thought to be a peripheral issue   . Anxiety 11/20/2011    Past Surgical History  Procedure Laterality Date  . Abdominal hysterectomy    . Total knee arthroplasty  1999  . Oophorectomy    . Cagb  1998  . Cesarean section      x 2  . Hemorrhoid surgery      History   Social History  . Marital Status: Divorced    Spouse Name: N/A    Number of Children: 53  . Years of Education: N/A   Occupational History  . retired     Social History Main Topics  . Smoking status: Former Research scientist (life sciences)  . Smokeless tobacco: Not on file     Comment: quit 2004, smoked 1.5 ppd  . Alcohol Use: No  . Drug Use: No  . Sexual Activity: Not on file   Other Topics Concern  . Not on file   Social History Narrative   Lives by herself, has life alert   Had 3 daughter- 1 son   (lost oldest daughter and son), 2 living daughters in [place]    Current Outpatient Prescriptions on File Prior to Visit  Medication Sig Dispense Refill  . albuterol (VENTOLIN HFA) 108 (90 BASE) MCG/ACT inhaler Inhale 2 puffs into the lungs every 6 (six) hours as needed for wheezing.  1 Inhaler  3  . ALPRAZolam (XANAX) 0.5 MG tablet TAKE 1 TABLET BY MOUTH 3 TIMES A DAY AS NEEDED  60 tablet  3  . amLODipine (NORVASC) 5 MG tablet TAKE 1 TABLET BY MOUTH EVERY DAY  30 tablet  0  . aspirin 81 MG tablet Take 81 mg by mouth daily.        Marland Kitchen atorvastatin (LIPITOR) 40 MG tablet TAKE 1 TABLET BY MOUTH AT BEDTIME  30 tablet  12  . beclomethasone (QVAR) 80 MCG/ACT inhaler Inhale 2 puffs into the lungs 2 (two) times daily.  1 Inhaler  6  . benazepril (LOTENSIN) 20 MG tablet Take 20 mg by mouth daily.       . Calcium Carbonate (CALCIUM 500 PO) Take 500 mg by  mouth daily.      . cholecalciferol (VITAMIN D) 1000 UNITS tablet Take 1,000 Units by mouth daily.        . clopidogrel (PLAVIX) 75 MG tablet Take 1 tablet (75 mg total) by mouth daily.  30 tablet  6  . cyclobenzaprine (FLEXERIL) 10 MG tablet TAKE 1 TABLET AT BEDTIME AS NEEDED  30 tablet  1  . fluocinonide cream (LIDEX) 0.05 % USED AS DIRECTED.  30 g  0  . furosemide (LASIX) 20 MG tablet TAKE 1 TABLET BY MOUTH EVERY DAY  30 tablet  2  . glucose blood test strip Check once daily as directed.  100 each  11  . JANUVIA 50 MG tablet TAKE 1 TABLET BY MOUTH EVERY DAY  30 tablet  6  . meclizine (ANTIVERT) 12.5 MG tablet Take 1 tablet (12.5 mg total) by mouth 3 (three) times daily as needed.  30 tablet  0  . methimazole (TAPAZOLE) 5 MG tablet 1 tab, 3 times a week (M, W, F)      . metoprolol tartrate (LOPRESSOR) 25 MG tablet TAKE 1 TABLET (25 MG TOTAL) BY MOUTH 3 (THREE) TIMES DAILY.  90 tablet  5  . NEXIUM 40 MG capsule TAKE 1 CAPSULE (40 MG TOTAL) BY MOUTH DAILY BEFORE BREAKFAST.  30 capsule  5  . nitroGLYCERIN (NITROSTAT) 0.3 MG SL tablet Place 1 tablet  (0.3 mg total) under the tongue every 5 (five) minutes as needed.  30 tablet  3  . ONE TOUCH LANCETS MISC Check once daily as directed.  200 each  11  . TRAVATAN Z 0.004 % SOLN ophthalmic solution       . [DISCONTINUED] potassium chloride (KLOR-CON 10) 10 MEQ CR tablet Take 1 tablet (10 mEq total) by mouth daily.  10 tablet  0   No current facility-administered medications on file prior to visit.    Allergies  Allergen Reactions  . Hydrocodone     REACTION: itching/nausea  . Tramadol Hcl     REACTION: itching , upset stomach (08-2009)    Family History  Problem Relation Age of Onset  . Colon cancer Neg Hx   . Breast cancer Neg Hx   . Coronary artery disease Son 29    deceased  . Heart attack Father   . Hypertension Brother   . Stroke Brother   . Thyroid disease Neg Hx    BP 114/70  Pulse 83  Temp(Src) 97.7 F (36.5 C) (Oral)  Ht 5\' 2"  (1.575 m)  Wt 173 lb (78.472 kg)  BMI 31.63 kg/m2  SpO2 98%  Review of Systems Denies fever and tremor.    Objective:   Physical Exam VITAL SIGNS:  See vs page GENERAL: no distress NECK: small multinodular goiter.   Lab Results  Component Value Date   TSH 1.16 05/13/2013      Assessment & Plan:  Hyperthyroidism: on rx.

## 2013-07-14 NOTE — Telephone Encounter (Signed)
Relevant patient education mailed to patient.  

## 2013-07-15 LAB — TSH: TSH: 0.81 u[IU]/mL (ref 0.35–5.50)

## 2013-07-21 ENCOUNTER — Ambulatory Visit (INDEPENDENT_AMBULATORY_CARE_PROVIDER_SITE_OTHER): Payer: Medicare Other | Admitting: Cardiovascular Disease

## 2013-07-24 ENCOUNTER — Other Ambulatory Visit: Payer: Self-pay | Admitting: Cardiovascular Disease

## 2013-07-26 NOTE — Telephone Encounter (Signed)
Rx was sent to pharmacy electronically. 

## 2013-08-02 ENCOUNTER — Ambulatory Visit (INDEPENDENT_AMBULATORY_CARE_PROVIDER_SITE_OTHER): Payer: Medicare Other | Admitting: Cardiovascular Disease

## 2013-08-02 ENCOUNTER — Encounter: Payer: Self-pay | Admitting: Cardiovascular Disease

## 2013-08-02 VITALS — BP 130/80 | HR 65 | Ht 62.0 in | Wt 174.1 lb

## 2013-08-02 DIAGNOSIS — Z952 Presence of prosthetic heart valve: Secondary | ICD-10-CM

## 2013-08-02 DIAGNOSIS — E782 Mixed hyperlipidemia: Secondary | ICD-10-CM

## 2013-08-02 DIAGNOSIS — R0609 Other forms of dyspnea: Secondary | ICD-10-CM

## 2013-08-02 DIAGNOSIS — R0600 Dyspnea, unspecified: Secondary | ICD-10-CM

## 2013-08-02 DIAGNOSIS — I10 Essential (primary) hypertension: Secondary | ICD-10-CM

## 2013-08-02 DIAGNOSIS — E785 Hyperlipidemia, unspecified: Secondary | ICD-10-CM

## 2013-08-02 DIAGNOSIS — R0989 Other specified symptoms and signs involving the circulatory and respiratory systems: Secondary | ICD-10-CM

## 2013-08-02 DIAGNOSIS — I4891 Unspecified atrial fibrillation: Secondary | ICD-10-CM

## 2013-08-02 DIAGNOSIS — Z951 Presence of aortocoronary bypass graft: Secondary | ICD-10-CM

## 2013-08-02 DIAGNOSIS — Z954 Presence of other heart-valve replacement: Secondary | ICD-10-CM

## 2013-08-02 DIAGNOSIS — I2510 Atherosclerotic heart disease of native coronary artery without angina pectoris: Secondary | ICD-10-CM

## 2013-08-02 DIAGNOSIS — R079 Chest pain, unspecified: Secondary | ICD-10-CM

## 2013-08-02 DIAGNOSIS — E119 Type 2 diabetes mellitus without complications: Secondary | ICD-10-CM

## 2013-08-02 DIAGNOSIS — Z79899 Other long term (current) drug therapy: Secondary | ICD-10-CM

## 2013-08-02 NOTE — Patient Instructions (Signed)
Labs -- CMP,CBC,TSH,BNP,LIPIDS  -NOTHING TO EAT OR DRINK THE MORNING OF THE TESTS  Your physician has requested that you have an echocardiogram. Echocardiography is a painless test that uses sound waves to create images of your heart. It provides your doctor with information about the size and shape of your heart and how well your heart's chambers and valves are working. This procedure takes approximately one hour. There are no restrictions for this procedure.  Your physician has requested that you have a lexiscan myoview. For further information please visit [email or web address]. Please follow instruction sheet, as given.  Your physician has recommended that you wear an event monitor. Event monitors are medical devices that record the heart's electrical activity. Doctors most often [place] these monitors to diagnose arrhythmias. Arrhythmias are problems with the speed or rhythm of the heartbeat. The monitor is a small, portable device. You can wear one while you do your normal daily activities. This is usually used to diagnose what is causing palpitations/syncope (passing out). YOU WILL WEAR THE MONITOR FOR [place] physician wants you to follow-up in 6 WEEKS with Dr Claiborne Billings. You will receive a reminder letter in the mail two months in advance. If you don't receive a letter, please call our office to schedule the follow-up appointment.

## 2013-08-04 ENCOUNTER — Other Ambulatory Visit: Payer: Self-pay | Admitting: Internal Medicine

## 2013-08-07 ENCOUNTER — Encounter: Payer: Self-pay | Admitting: Cardiovascular Disease

## 2013-08-07 DIAGNOSIS — R0600 Dyspnea, unspecified: Secondary | ICD-10-CM | POA: Insufficient documentation

## 2013-08-07 NOTE — Progress Notes (Signed)
Patient ID: RYLIN SAEZ, female   DOB: Aug 14, 1922, 80 y.o.   MRN: [id number]     PATIENT PROFILE: Ms. Sherrika Weakland is an 80 year old female who presents to the office today to establish cardiology care with me. She is a former patient of Dr. Rollene Fare to assist retired from his own practice.   HPI: Ms. Tantillo underwent a pericardial aortic valve replacement in 1998 and single vessel bypass with a vein graft to the right artery artery. She also has a history of sick sinus syndrome with paroxysmal atrial fibrillation and was taken off Coumadin anticoagulation in the past due to multiple falls in 2009 in 2010. There is no history of CVA or TIAs. She does have significant arthritic issues with back discomfort and also arthritis of her knees. She last saw Dr. Rollene Fare one year ago and at that time, she was in a normal sinus rhythm.  Recently, Ms. Netzer has noticed increasing palpitations as well as chest fluttering. She has significant shortness of breath almost all the time. She currently is living with her grandson. She does note some wheezing. Her last nuclear perfusion study was in 2011 which was normal. Her last echo Doppler study was in 2013 which showed moderate asymmetric LVH with septal wall at 1.6 cm and posterior wall 1.1 cm of the sigmoid septum. Ejection fraction was greater than 55%. She had mild to moderate mitral regurgitation, mild to moderate tricuspid regurgitation with elevation of RV systolic pressure at 46 mm. There bioprosthetic aortic valve had a peak gradient of 17 and mean gradient of 8 within normal limits for this valve.  She now walks with a walker. She has undergone left knee replacement. She has history of diabetes mellitus as well as hypertension. She has been on dual antiplatelet therapy with aspirin and Plavix. She presents for evaluation.  Past Medical History  Diagnosis Date  . Asthma   . Diabetes mellitus   . Hypertension   . Hyperlipidemia   . CAD (coronary artery disease)      s/p CABG s/p AO valve replacement ([facility])  . Recurrent UTI   . Paroxysmal atrial fibrillation     cards d/c coumadin 12/2008 d/t persisten NSR and frequent falls- restarted coumadin july 2011  . Hyperthyroidism   . Dry skin   . Osteoarthritis   . Osteopenia     per dexa 12/09  . Keloid     @ chest  . Dizziness     Chronic, admiet 07-2010,saw neuro, thought to be a peripheral issue   . Anxiety 11/20/2011    Past Surgical History  Procedure Laterality Date  . Abdominal hysterectomy    . Total knee arthroplasty  1999  . Oophorectomy    . Cagb  1998  . Cesarean section      x 2  . Hemorrhoid surgery      Allergies  Allergen Reactions  . Hydrocodone     REACTION: itching/nausea  . Tramadol Hcl     REACTION: itching , upset stomach (08-2009)    Current Outpatient Prescriptions  Medication Sig Dispense Refill  . albuterol (VENTOLIN HFA) 108 (90 BASE) MCG/ACT inhaler Inhale 2 puffs into the lungs every 6 (six) hours as needed for wheezing.  1 Inhaler  3  . ALPRAZolam (XANAX) 0.5 MG tablet TAKE 1 TABLET BY MOUTH 3 TIMES A DAY AS NEEDED  60 tablet  3  . amLODipine (NORVASC) 5 MG tablet TAKE 1 TABLET BY MOUTH DAILY  30 tablet  0  . aspirin 81 MG tablet Take 81 mg by mouth daily.        Marland Kitchen atorvastatin (LIPITOR) 40 MG tablet TAKE 1 TABLET BY MOUTH AT BEDTIME  30 tablet  12  . beclomethasone (QVAR) 80 MCG/ACT inhaler Inhale 2 puffs into the lungs 2 (two) times daily.  1 Inhaler  6  . benazepril (LOTENSIN) 20 MG tablet Take 20 mg by mouth daily.       . Calcium Carbonate (CALCIUM 500 PO) Take 500 mg by mouth daily.      . cholecalciferol (VITAMIN D) 1000 UNITS tablet Take 1,000 Units by mouth daily.        . clopidogrel (PLAVIX) 75 MG tablet Take 1 tablet (75 mg total) by mouth daily.  30 tablet  6  . cyclobenzaprine (FLEXERIL) 10 MG tablet TAKE 1 TABLET AT BEDTIME AS NEEDED  30 tablet  1  . fluocinonide cream (LIDEX) 0.05 % USED AS DIRECTED.  30 g  0  . furosemide  (LASIX) 20 MG tablet TAKE 1 TABLET BY MOUTH EVERY DAY  30 tablet  2  . meclizine (ANTIVERT) 12.5 MG tablet Take 1 tablet (12.5 mg total) by mouth 3 (three) times daily as needed.  30 tablet  0  . methimazole (TAPAZOLE) 5 MG tablet 1 tab, 3 times a week (M, W, F)      . metoprolol tartrate (LOPRESSOR) 25 MG tablet TAKE 1 TABLET (25 MG TOTAL) BY MOUTH 3 (THREE) TIMES DAILY.  90 tablet  5  . NEXIUM 40 MG capsule TAKE 1 CAPSULE (40 MG TOTAL) BY MOUTH DAILY BEFORE BREAKFAST.  30 capsule  5  . nitroGLYCERIN (NITROSTAT) 0.3 MG SL tablet Place 1 tablet (0.3 mg total) under the tongue every 5 (five) minutes as needed.  30 tablet  3  . TRAVATAN Z 0.004 % SOLN ophthalmic solution       . JANUVIA 50 MG tablet TAKE 1 TABLET BY MOUTH EVERY DAY  30 tablet  4  . [DISCONTINUED] potassium chloride (KLOR-CON 10) 10 MEQ CR tablet Take 1 tablet (10 mEq total) by mouth daily.  10 tablet  0   No current facility-administered medications for this visit.    Socially she is widowed. She lives with her grandson. She has 2 deceased children, 4 children alive as well as 4 grandchildren 3 great-grandchildren. There is no tobacco use.  Family History  Problem Relation Age of Onset  . Colon cancer Neg Hx   . Breast cancer Neg Hx   . Thyroid disease Neg Hx   . Coronary artery disease Son 34    deceased  . Heart attack Father   . Hypertension Brother   . Stroke Brother    [facility] is noteworthy in that her father suffered an MI at age 86. Her mother died at childbirth.  ROS is negative for fever chills or night sweats. She denies recent headaches. There is no rash. She denies change in vision or hearing. She does have some mild dementia. There is some occasional wheezing. She admits to being shortness of breath most of the time. She walks a walker. She denies any recent prolonged chest pain. She denies nausea vomiting or diarrhea. She is unaware of blood in stool or urine. There is no obvious claudication. At times his  intermittent swelling. She denies tremors. She is diabetic. She denies cold or heat intolerance. Other comprehensive 14 point system review is negative.  PE BP 130/80  Pulse 65  Ht 5'  2" (1.575 m)  Wt 174 lb 1.6 oz (78.971 kg)  BMI 31.84 kg/m2 General: Alert, oriented, no distress.  Skin: normal turgor, no rashes HEENT: Normocephalic, atraumatic. There is significant arcus senilis.  Pupils round and reactive; sclera anicteric; extraocular muscles intact; Fundi arteriolar narrowing. Nose without nasal septal hypertrophy Mouth/Parynx benign; Mallinpatti scale 2 Neck: No JVD, possible soft carotid bruits versus transmitted murmur; normal carotid upstroke Lungs: clear to ausculatation and percussion; no wheezing or rales Chest wall: without tenderness to palpitation  Heart: RRR, s1 s2 normal; 2/6 systolic murmur at the right and left sternal border. No diastolic murmur. No rubs thrills or heaves Abdomen: soft, nontender; no hepatosplenomehaly, BS+; abdominal aorta nontender and not dilated by palpation. Back: no CVA tenderness Pulses 2+ Extremities: no clubbinbg cyanosis or edema, Homan's sign negative  Neurologic: grossly nonfocal; Cranial nerves grossly wnl Psychologic: Normal mood and affect    ECG (independently read by me): Atrial flutter with variable block at approximately 65 beats per minute  LABS:  BMET    Component Value Date/Time   NA 141 04/09/2013 1507   K 3.6 04/09/2013 1507   CL 104 04/09/2013 1507   CO2 30 04/09/2013 1507   GLUCOSE 93 04/09/2013 1507   BUN 14 04/09/2013 1507   CREATININE 1.03 04/09/2013 1507   CREATININE 1.0 10/14/2012 1008   CALCIUM 9.8 04/09/2013 1507   GFRNONAA 52* 06/29/2010 0420   GFRAA  Value: >60        The eGFR has been calculated using the MDRD equation. This calculation has not been validated in all clinical situations. eGFR's persistently <60 mL/min signify possible Chronic Kidney Disease. 06/29/2010 0420     Hepatic Function Panel      Component Value Date/Time   PROT 6.6 06/27/2010 1618   ALBUMIN 3.4* 06/27/2010 1618   AST 25 05/18/2012 0952   ALT 21 05/18/2012 0952   ALKPHOS 83 06/27/2010 1618   BILITOT 0.3 06/27/2010 1618   BILIDIR 0.1 06/27/2010 1415   IBILI 0.3 06/27/2010 1415     CBC    Component Value Date/Time   WBC 5.5 10/14/2012 1008   RBC 4.11 10/14/2012 1008   HGB 13.3 10/14/2012 1008   HCT 39.5 10/14/2012 1008   PLT 172.0 10/14/2012 1008   MCV 96.2 10/14/2012 1008   MCH 30.8 06/30/2010 0500   MCHC 33.7 10/14/2012 1008   RDW 14.1 10/14/2012 1008   LYMPHSABS 1.1 10/14/2012 1008   MONOABS 0.5 10/14/2012 1008   EOSABS 0.2 10/14/2012 1008   BASOSABS 0.0 10/14/2012 1008     BNP No results found for this basename: probnp    Lipid Panel     Component Value Date/Time   CHOL 126 10/14/2012 1008   TRIG 90.0 10/14/2012 1008   HDL 46.20 10/14/2012 1008   CHOLHDL 3 10/14/2012 1008   VLDL 18.0 10/14/2012 1008   LDLCALC 62 10/14/2012 1008     RADIOLOGY: No results found.   ASSESSMENT AND PLAN:  Ms. Sada Mazzoni is an 80 year old African American female who is 17 years status post aortic valve replacement with a per cardial tissue valve at which time she underwent single-vessel bypass with a saphenous vein graft placed to) artery. His possibly 4 years since her last nuclear perfusion study which remains low risk. Her last echo was in April 2013 which revealed normal prosthetic valvular gradient. She does have asymmetric left ventricular hypertrophy. I am recommending that a complete set of laboratory be checked including a CMP, CBC, TSH, BNP,  and lipid panel. I am scheduling her for an echo Doppler study in followup of her 80 year old aortic valve replacement. She will also undergo a LexiScan Myoview scan to make certain her significant shortness of breath is not ischemia mediated or that she has not developed progressive coronary obstructive disease. I'm scheduling her for an event monitor. She is currently taking Lopressor at a dose of 25 mg  3 times a day and her pulse rate is in the 60s on ECG.  She has not been felt to be a Coumadin candidate and is on aspirin and Plavix. I will see her in followup of the above studies and further recommendations will be made at that time.  Troy Sine, MD, [facility] 08/07/2013 6:28 PM

## 2013-08-08 DIAGNOSIS — I639 Cerebral infarction, unspecified: Secondary | ICD-10-CM

## 2013-08-08 HISTORY — DX: Cerebral infarction, unspecified: I63.9

## 2013-08-10 ENCOUNTER — Ambulatory Visit (HOSPITAL_COMMUNITY)
Admission: RE | Admit: 2013-08-10 | Discharge: 2013-08-10 | Disposition: A | Discharge status: Home / Self Care | Payer: Medicare Other | Source: Ambulatory Visit | Attending: Cardiovascular Disease | Admitting: Cardiovascular Disease

## 2013-08-10 DIAGNOSIS — Z952 Presence of prosthetic heart valve: Secondary | ICD-10-CM

## 2013-08-10 DIAGNOSIS — I369 Nonrheumatic tricuspid valve disorder, unspecified: Secondary | ICD-10-CM

## 2013-08-10 DIAGNOSIS — Z951 Presence of aortocoronary bypass graft: Secondary | ICD-10-CM | POA: Insufficient documentation

## 2013-08-10 DIAGNOSIS — Z954 Presence of other heart-valve replacement: Secondary | ICD-10-CM | POA: Insufficient documentation

## 2013-08-11 NOTE — Progress Notes (Signed)
Echo completed 08/10/2013, Marygrace Drought RCS

## 2013-08-12 ENCOUNTER — Ambulatory Visit (HOSPITAL_COMMUNITY)
Admission: RE | Admit: 2013-08-12 | Discharge: 2013-08-12 | Disposition: A | Discharge status: Home / Self Care | Payer: Medicare Other | Source: Ambulatory Visit | Attending: Cardiology | Admitting: Cardiology

## 2013-08-12 DIAGNOSIS — R0609 Other forms of dyspnea: Secondary | ICD-10-CM | POA: Insufficient documentation

## 2013-08-12 DIAGNOSIS — R0989 Other specified symptoms and signs involving the circulatory and respiratory systems: Principal | ICD-10-CM | POA: Insufficient documentation

## 2013-08-12 DIAGNOSIS — R079 Chest pain, unspecified: Secondary | ICD-10-CM

## 2013-08-12 LAB — CBC
HEMATOCRIT: 40.6 % (ref 36.0–46.0)
Hemoglobin: 13.3 g/dL (ref 12.0–15.0)
MCH: 31.4 pg (ref 26.0–34.0)
MCHC: 32.8 g/dL (ref 30.0–36.0)
MCV: 95.8 fL (ref 78.0–100.0)
PLATELETS: 229 10*3/uL (ref 150–400)
RBC: 4.24 MIL/uL (ref 3.87–5.11)
RDW: 14 % (ref 11.5–15.5)
WBC: 5.5 10*3/uL (ref 4.0–10.5)

## 2013-08-12 MED ORDER — TECHNETIUM TC 99M SESTAMIBI GENERIC - CARDIOLITE
30.0000 | Freq: Once | INTRAVENOUS | Status: AC | PRN
  Administered 2013-08-12: 30 via INTRAVENOUS

## 2013-08-12 MED ORDER — REGADENOSON 0.4 MG/5ML IV SOLN: 0.4000 mg | Freq: Once | INTRAVENOUS | Status: DC

## 2013-08-12 MED ORDER — TECHNETIUM TC 99M SESTAMIBI GENERIC - CARDIOLITE: 30.5000 | Freq: Once | INTRAVENOUS | Status: AC | PRN

## 2013-08-12 MED ORDER — REGADENOSON 0.4 MG/5ML IV SOLN
0.4000 mg | Freq: Once | INTRAVENOUS | Status: AC
  Administered 2013-08-12: 0.4 mg via INTRAVENOUS

## 2013-08-12 MED ORDER — TECHNETIUM TC 99M SESTAMIBI GENERIC - CARDIOLITE
10.0000 | Freq: Once | INTRAVENOUS | Status: AC | PRN
  Administered 2013-08-12: 10 via INTRAVENOUS

## 2013-08-12 MED ORDER — TECHNETIUM TC 99M SESTAMIBI GENERIC - CARDIOLITE: 10.6000 | Freq: Once | INTRAVENOUS | Status: AC | PRN

## 2013-08-12 NOTE — Procedures (Addendum)
[place] [address] [place] [address] [phone]  Cardiology Nuclear Med Study  Rebecca Skinner is a 80 y.o. female     MRN : [id number]     DOB: 12-12-1922  Procedure Date: 08/12/2013  Nuclear Med Background Indication for Stress Test:  Graft Patency History:  Asthma and CAD;CABG;AF;AV Replacement 10/2006 Cardiac Risk Factors: Family History - CAD, History of Smoking, Hypertension, IDDM Type 2, Lipids and Obesity  Symptoms:  Chest Pain, Dizziness, DOE, Fatigue, Light-Headedness and Palpitations   Nuclear Pre-Procedure Caffeine/Decaff Intake:  9:00pm NPO After: 7:00am   IV Site: R Forearm  IV 0.9% NS with Angio Cath:  22g  Chest Size (in):  n/a IV Started by: Azucena Cecil, RN  Height: 5\' 2"  (1.575 m)  Cup Size: B  BMI:  Body mass index is 31.63 kg/(m^2). Weight:  173 lb (78.472 kg)   Tech Comments:  n/a    Nuclear Med Study 1 or 2 day study: 1 day  Stress Test Type:  [place] Provider:  Shelva Majestic, MD   Resting Radionuclide: Technetium 72m Sestamibi  Resting Radionuclide Dose: 10.2 mCi   Stress Radionuclide:  Technetium 57m Sestamibi  Stress Radionuclide Dose: 30.2 mCi           Stress Protocol Rest HR: 77 Stress HR: 100  Rest BP: 149/118 Stress BP: 165/98  Exercise Time (min): n/a METS: n/a   Predicted Max HR: 132 bpm % Max HR: 83.33 bpm Rate Pressure Product: 20130  Dose of Adenosine (mg):  n/a Dose of Lexiscan: 0.4 mg  Dose of Atropine (mg): n/a Dose of Dobutamine: n/a mcg/kg/min (at max HR)  Stress Test Technologist: Leane Para, CCT Nuclear Technologist: Imagene Riches, CNMT   Rest Procedure:  Myocardial perfusion imaging was performed at rest 45 minutes following the intravenous administration of Technetium 13m Sestamibi. Stress Procedure:  The patient received IV Lexiscan 0.4 mg over 15-seconds.  Technetium 44m Sestamibi injected at 30-seconds.  The patient experienced SOB  and stomach tightness; 75 mg of IV Aminophylline was administered with resolution of symptoms.  There were no significant changes with Lexiscan.  Quantitative spect images were obtained after a 45 minute delay.  Transient Ischemic Dilatation (Normal <1.22):  1.10 Lung/Heart Ratio (Normal <0.45):  0.27 QGS EDV:  n/a ml QGS ESV:  n/a ml LV Ejection Fraction: study not gated  Signed by Imagene Riches, CNMT  PHYSICIAN INTERPRETATION  Rest ECG: Atrial Fibrilliation and LVH. - NSST  Stress ECG: No significant change from baseline ECG, No significant ST segment change suggestive of ischemia. and There are scattered PVCs.  QPS Raw Data Images:  Acquisition technically good; normal left ventricular size. Stress Images:  There is decreased uptake in the lateral wall. Medium sized, mild-moderate intensity perfusion defect in the mid Lateral wall. There is partial reversibility in this area. Rest Images:  There is decreased uptake in the lateral wall. Subtraction (SDS):  There is a fixed defect that is most consistent with a previous infarction. There is partial reversibility in this area. These findings are consistent with mild per-infarction ischemia.   Impression Exercise Capacity:  Lexiscan with no exercise. BP Response:  Normal blood pressure response. Clinical Symptoms:  There is dyspnea. ECG Impression:  No significant ECG changes with Lexiscan. Comparison with Prior Nuclear Study: No images to compare  Overall Impression:  Intermediate risk stress nuclear study with medium sized mostly fixed lateral defect consistent with prior infarction with mild  per-infarct ischemia.  Clinical Correlation is recommended - Pt is s/p CABG.  LV Wall Motion:  Not assessed due to Afib.   [name], MD  08/12/2013 1:02 PM

## 2013-08-13 LAB — LIPID PANEL
Cholesterol: 106 mg/dL (ref 0–200)
HDL: 36 mg/dL — AB (ref >39)
LDL CALC: 58 mg/dL (ref 0–99)
Total CHOL/HDL Ratio: 2.9 Ratio
Triglycerides: 62 mg/dL (ref <150)
VLDL: 12 mg/dL (ref 0–40)

## 2013-08-13 LAB — COMPREHENSIVE METABOLIC PANEL
ALK PHOS: 91 U/L (ref 39–117)
ALT: 28 U/L (ref 0–35)
AST: 27 U/L (ref 0–37)
Albumin: 4.3 g/dL (ref 3.5–5.2)
BILIRUBIN TOTAL: 0.6 mg/dL (ref 0.2–1.2)
BUN: 13 mg/dL (ref 6–23)
CALCIUM: 9.4 mg/dL (ref 8.4–10.5)
CO2: 28 mEq/L (ref 19–32)
Chloride: 103 mEq/L (ref 96–112)
Creat: 0.98 mg/dL (ref 0.50–1.10)
Glucose, Bld: 110 mg/dL — ABNORMAL HIGH (ref 70–99)
Potassium: 4.3 mEq/L (ref 3.5–5.3)
Sodium: 141 mEq/L (ref 135–145)
Total Protein: 7 g/dL (ref 6.0–8.3)

## 2013-08-13 LAB — BRAIN NATRIURETIC PEPTIDE: Brain Natriuretic Peptide: 395.8 pg/mL — ABNORMAL HIGH (ref 0.0–100.0)

## 2013-08-13 LAB — TSH: TSH: 2.371 u[IU]/mL (ref 0.350–4.500)

## 2013-08-20 ENCOUNTER — Other Ambulatory Visit: Payer: Self-pay | Admitting: Internal Medicine

## 2013-08-20 ENCOUNTER — Other Ambulatory Visit: Payer: Self-pay

## 2013-08-20 ENCOUNTER — Telehealth: Payer: Self-pay

## 2013-08-20 DIAGNOSIS — E119 Type 2 diabetes mellitus without complications: Secondary | ICD-10-CM

## 2013-08-20 MED ORDER — METHIMAZOLE 5 MG PO TABS: ORAL_TABLET | ORAL | Status: DC

## 2013-08-20 NOTE — Telephone Encounter (Signed)
Done

## 2013-08-20 NOTE — Telephone Encounter (Signed)
3 times a week.

## 2013-08-20 NOTE — Telephone Encounter (Signed)
Fax received from pharmacy requesting refill on tapazole 5mg . Wanted to verify the script. Is that pt taking 1/day or 1 pill 3 times a week on M, W, and F? Please advise,  Thanks!

## 2013-08-27 ENCOUNTER — Emergency Department (HOSPITAL_COMMUNITY): Payer: Medicare Other

## 2013-08-27 ENCOUNTER — Telehealth: Payer: Self-pay

## 2013-08-27 ENCOUNTER — Inpatient Hospital Stay (HOSPITAL_COMMUNITY)
Admission: EM | Admit: 2013-08-27 | Discharge: 2013-08-31 | DRG: 063 | Disposition: A | Discharge status: Home / Self Care | Payer: Medicare Other | Attending: Neurology | Admitting: Neurology

## 2013-08-27 ENCOUNTER — Inpatient Hospital Stay (HOSPITAL_COMMUNITY): Payer: Medicare Other

## 2013-08-27 DIAGNOSIS — I4891 Unspecified atrial fibrillation: Secondary | ICD-10-CM

## 2013-08-27 DIAGNOSIS — Z9181 History of falling: Secondary | ICD-10-CM

## 2013-08-27 DIAGNOSIS — Z6831 Body mass index (BMI) 31.0-31.9, adult: Secondary | ICD-10-CM

## 2013-08-27 DIAGNOSIS — J45909 Unspecified asthma, uncomplicated: Secondary | ICD-10-CM

## 2013-08-27 DIAGNOSIS — Z87891 Personal history of nicotine dependence: Secondary | ICD-10-CM

## 2013-08-27 DIAGNOSIS — Z951 Presence of aortocoronary bypass graft: Secondary | ICD-10-CM

## 2013-08-27 DIAGNOSIS — G609 Hereditary and idiopathic neuropathy, unspecified: Secondary | ICD-10-CM | POA: Diagnosis present

## 2013-08-27 DIAGNOSIS — I2510 Atherosclerotic heart disease of native coronary artery without angina pectoris: Secondary | ICD-10-CM

## 2013-08-27 DIAGNOSIS — E0590 Thyrotoxicosis, unspecified without thyrotoxic crisis or storm: Secondary | ICD-10-CM | POA: Diagnosis present

## 2013-08-27 DIAGNOSIS — F0789 Other personality and behavioral disorders due to known physiological condition: Secondary | ICD-10-CM | POA: Diagnosis present

## 2013-08-27 DIAGNOSIS — I10 Essential (primary) hypertension: Secondary | ICD-10-CM | POA: Diagnosis present

## 2013-08-27 DIAGNOSIS — E785 Hyperlipidemia, unspecified: Secondary | ICD-10-CM | POA: Diagnosis present

## 2013-08-27 DIAGNOSIS — E669 Obesity, unspecified: Secondary | ICD-10-CM | POA: Diagnosis present

## 2013-08-27 DIAGNOSIS — I6340 Cerebral infarction due to embolism of unspecified cerebral artery: Principal | ICD-10-CM | POA: Diagnosis present

## 2013-08-27 DIAGNOSIS — E119 Type 2 diabetes mellitus without complications: Secondary | ICD-10-CM

## 2013-08-27 DIAGNOSIS — I639 Cerebral infarction, unspecified: Secondary | ICD-10-CM | POA: Diagnosis present

## 2013-08-27 DIAGNOSIS — R413 Other amnesia: Secondary | ICD-10-CM | POA: Diagnosis present

## 2013-08-27 DIAGNOSIS — R4701 Aphasia: Secondary | ICD-10-CM | POA: Diagnosis present

## 2013-08-27 DIAGNOSIS — Z8673 Personal history of transient ischemic attack (TIA), and cerebral infarction without residual deficits: Secondary | ICD-10-CM

## 2013-08-27 DIAGNOSIS — F419 Anxiety disorder, unspecified: Secondary | ICD-10-CM

## 2013-08-27 DIAGNOSIS — I6350 Cerebral infarction due to unspecified occlusion or stenosis of unspecified cerebral artery: Secondary | ICD-10-CM

## 2013-08-27 DIAGNOSIS — F411 Generalized anxiety disorder: Secondary | ICD-10-CM | POA: Diagnosis present

## 2013-08-27 DIAGNOSIS — Z6830 Body mass index (BMI) 30.0-30.9, adult: Secondary | ICD-10-CM

## 2013-08-27 DIAGNOSIS — Z79899 Other long term (current) drug therapy: Secondary | ICD-10-CM

## 2013-08-27 LAB — APTT: APTT: 31 s (ref 24–37)

## 2013-08-27 LAB — CBG MONITORING, ED: Glucose-Capillary: 105 mg/dL — ABNORMAL HIGH (ref 70–99)

## 2013-08-27 LAB — RAPID URINE DRUG SCREEN, HOSP PERFORMED
AMPHETAMINES: NOT DETECTED
Barbiturates: NOT DETECTED
Benzodiazepines: NOT DETECTED
Cocaine: NOT DETECTED
Opiates: NOT DETECTED
TETRAHYDROCANNABINOL: NOT DETECTED

## 2013-08-27 LAB — COMPREHENSIVE METABOLIC PANEL
ALT: 19 U/L (ref 0–35)
AST: 25 U/L (ref 0–37)
Albumin: 3.7 g/dL (ref 3.5–5.2)
Alkaline Phosphatase: 97 U/L (ref 39–117)
BILIRUBIN TOTAL: 0.5 mg/dL (ref 0.3–1.2)
BUN: 16 mg/dL (ref 6–23)
CO2: 27 mEq/L (ref 19–32)
Calcium: 9.5 mg/dL (ref 8.4–10.5)
Chloride: 100 mEq/L (ref 96–112)
Creatinine, Ser: 1.01 mg/dL (ref 0.50–1.10)
GFR calc Af Amer: 56 mL/min — ABNORMAL LOW (ref >90)
GFR calc non Af Amer: 48 mL/min — ABNORMAL LOW (ref >90)
Glucose, Bld: 107 mg/dL — ABNORMAL HIGH (ref 70–99)
Potassium: 4.5 mEq/L (ref 3.7–5.3)
SODIUM: 142 meq/L (ref 137–147)
TOTAL PROTEIN: 7.6 g/dL (ref 6.0–8.3)

## 2013-08-27 LAB — DIFFERENTIAL
Basophils Absolute: 0 10*3/uL (ref 0.0–0.1)
Basophils Relative: 1 % (ref 0–1)
EOS ABS: 0.2 10*3/uL (ref 0.0–0.7)
Eosinophils Relative: 3 % (ref 0–5)
LYMPHS PCT: 28 % (ref 12–46)
Lymphs Abs: 1.7 10*3/uL (ref 0.7–4.0)
MONOS PCT: 15 % — AB (ref 3–12)
Monocytes Absolute: 0.9 10*3/uL (ref 0.1–1.0)
NEUTROS PCT: 54 % (ref 43–77)
Neutro Abs: 3.4 10*3/uL (ref 1.7–7.7)

## 2013-08-27 LAB — PROTIME-INR
INR: 1.24 (ref 0.00–1.49)
PROTHROMBIN TIME: 15.3 s — AB (ref 11.6–15.2)

## 2013-08-27 LAB — CBC
HCT: 45.8 % (ref 36.0–46.0)
HEMOGLOBIN: 15.1 g/dL — AB (ref 12.0–15.0)
MCH: 32.5 pg (ref 26.0–34.0)
MCHC: 33 g/dL (ref 30.0–36.0)
MCV: 98.5 fL (ref 78.0–100.0)
PLATELETS: 210 10*3/uL (ref 150–400)
RBC: 4.65 MIL/uL (ref 3.87–5.11)
RDW: 13.7 % (ref 11.5–15.5)
WBC: 6.2 10*3/uL (ref 4.0–10.5)

## 2013-08-27 LAB — URINALYSIS, ROUTINE W REFLEX MICROSCOPIC
Bilirubin Urine: NEGATIVE
Glucose, UA: NEGATIVE mg/dL
Hgb urine dipstick: NEGATIVE
Ketones, ur: NEGATIVE mg/dL
Nitrite: NEGATIVE
PROTEIN: NEGATIVE mg/dL
SPECIFIC GRAVITY, URINE: 1.038 — AB (ref 1.005–1.030)
UROBILINOGEN UA: 0.2 mg/dL (ref 0.0–1.0)
pH: 6.5 (ref 5.0–8.0)

## 2013-08-27 LAB — I-STAT CHEM 8, ED
BUN: 17 mg/dL (ref 6–23)
CALCIUM ION: 1.15 mmol/L (ref 1.13–1.30)
Chloride: 101 mEq/L (ref 96–112)
Creatinine, Ser: 1.2 mg/dL — ABNORMAL HIGH (ref 0.50–1.10)
GLUCOSE: 108 mg/dL — AB (ref 70–99)
HEMATOCRIT: 49 % — AB (ref 36.0–46.0)
Hemoglobin: 16.7 g/dL — ABNORMAL HIGH (ref 12.0–15.0)
Potassium: 4.2 mEq/L (ref 3.7–5.3)
Sodium: 143 mEq/L (ref 137–147)
TCO2: 28 mmol/L (ref 0–100)

## 2013-08-27 LAB — GLUCOSE, CAPILLARY: GLUCOSE-CAPILLARY: 120 mg/dL — AB (ref 70–99)

## 2013-08-27 LAB — URINE MICROSCOPIC-ADD ON

## 2013-08-27 LAB — I-STAT TROPONIN, ED: Troponin i, poc: 0 ng/mL (ref 0.00–0.08)

## 2013-08-27 LAB — ETHANOL: Alcohol, Ethyl (B): <11 mg/dL (ref 0–11)

## 2013-08-27 LAB — MRSA PCR SCREENING: MRSA BY PCR: NEGATIVE

## 2013-08-27 MED ORDER — ACETAMINOPHEN 650 MG RE SUPP: 650.0000 mg | RECTAL | Status: DC | PRN

## 2013-08-27 MED ORDER — FLUTICASONE PROPIONATE HFA 44 MCG/ACT IN AERO
1.0000 | INHALATION_SPRAY | Freq: Two times a day (BID) | RESPIRATORY_TRACT | Status: DC
  Administered 2013-08-27 – 2013-08-31 (×7): 1 via RESPIRATORY_TRACT
  Filled 2013-08-27: qty 10.6

## 2013-08-27 MED ORDER — IOHEXOL 350 MG/ML SOLN
80.0000 mL | Freq: Once | INTRAVENOUS | Status: AC | PRN
  Administered 2013-08-27: 80 mL via INTRAVENOUS

## 2013-08-27 MED ORDER — ACETAMINOPHEN 325 MG PO TABS: 650.0000 mg | ORAL_TABLET | ORAL | Status: DC | PRN

## 2013-08-27 MED ORDER — METHIMAZOLE 5 MG PO TABS
5.0000 mg | ORAL_TABLET | ORAL | Status: DC
  Administered 2013-08-30: 5 mg via ORAL
  Filled 2013-08-27 (×2): qty 1

## 2013-08-27 MED ORDER — LABETALOL HCL 5 MG/ML IV SOLN
10.0000 mg | INTRAVENOUS | Status: DC | PRN
  Administered 2013-08-28 – 2013-08-29 (×4): 10 mg via INTRAVENOUS
  Filled 2013-08-27 (×3): qty 4

## 2013-08-27 MED ORDER — PANTOPRAZOLE SODIUM 40 MG IV SOLR
40.0000 mg | Freq: Every day | INTRAVENOUS | Status: DC
  Administered 2013-08-27 – 2013-08-28 (×2): 40 mg via INTRAVENOUS
  Filled 2013-08-27 (×3): qty 40

## 2013-08-27 MED ORDER — SODIUM CHLORIDE 0.9 % IV SOLN
INTRAVENOUS | Status: DC
  Administered 2013-08-27: 17:00:00 via INTRAVENOUS
  Administered 2013-08-28: 100 mL/h via INTRAVENOUS
  Administered 2013-08-28: 01:00:00 via INTRAVENOUS

## 2013-08-27 MED ORDER — ALTEPLASE (STROKE) FULL DOSE INFUSION
0.9000 mg/kg | Freq: Once | INTRAVENOUS | Status: AC
  Administered 2013-08-27: 71 mg via INTRAVENOUS
  Filled 2013-08-27: qty 71

## 2013-08-27 MED ORDER — LORAZEPAM 2 MG/ML IJ SOLN: 1.0000 mg | INTRAMUSCULAR | Status: AC

## 2013-08-27 NOTE — H&P (Signed)
Referring Physician: Alvino Chapel    Chief Complaint: code stroke  HPI:                                                                                                                                         Rebecca Skinner is an 80 y.o. female who was last seen normal at her baseline at 9:30 AM. later the nephew came home and found patient not responding appropriately, moaning, but did not note any Jerking motion. Patient was brought to [facility] hospital as a code stroke.  On arrival it was noted patient would not follow commands, moaning, left gaze deviation.   There was concern for both stroke versus status epilepticus.  Initial CT head showed no acute bleed. It was discussed with patients family about need for tPA and family was in agreement.  In addition EEG was called to obtain a stat EEG. Patient was also brought to CT a second time to evaluate for large vessel occlusion.   Date last known well: Date: 08/27/2013 Time last known well: Time: 09:30 tPA Given: Yes  Past Medical History  Diagnosis Date  . Asthma   . Diabetes mellitus   . Hypertension   . Hyperlipidemia   . CAD (coronary artery disease)     s/p CABG s/p AO valve replacement ([place] [facility])  . Recurrent UTI   . Paroxysmal atrial fibrillation     cards d/c coumadin 12/2008 d/t persisten NSR and frequent falls- restarted coumadin july 2011  . Hyperthyroidism   . Dry skin   . Osteoarthritis   . Osteopenia     per dexa 12/09  . Keloid     @ chest  . Dizziness     Chronic, admiet 07-2010,saw neuro, thought to be a peripheral issue   . Anxiety 11/20/2011    Past Surgical History  Procedure Laterality Date  . Abdominal hysterectomy    . Total knee arthroplasty  1999  . Oophorectomy    . Cagb  1998  . Cesarean section      x 2  . Hemorrhoid surgery      Family History  Problem Relation Age of Onset  . Colon cancer Neg Hx   . Breast cancer Neg Hx   . Thyroid disease Neg Hx   . Coronary artery disease Son 57    deceased   . Heart attack Father   . Hypertension Brother   . Stroke Brother    Social History:  reports that she has quit smoking. She does not have any smokeless tobacco history on file. She reports that she does not drink alcohol or use illicit drugs.  Allergies:  Allergies  Allergen Reactions  . Hydrocodone     REACTION: itching/nausea  . Tramadol Hcl     REACTION: itching , upset stomach (08-2009)    Medications:  Current Facility-Administered Medications  Medication Dose Route Frequency Provider Last Rate Last Dose  . 0.9 %  sodium chloride infusion   Intravenous Continuous Roland Rack, MD 75 mL/hr at 08/27/13 1630    . acetaminophen (TYLENOL) tablet 650 mg  650 mg Oral Q4H PRN Roland Rack, MD       Or  . acetaminophen (TYLENOL) suppository 650 mg  650 mg Rectal Q4H PRN Roland Rack, MD      . labetalol (NORMODYNE,TRANDATE) injection 10 mg  10 mg Intravenous Q10 min PRN Roland Rack, MD      . LORazepam (ATIVAN) injection 1 mg  1 mg Intravenous STAT Marliss Coots, PA-C      . pantoprazole (PROTONIX) injection 40 mg  40 mg Intravenous QHS Roland Rack, MD         ROS:                                                                                                                                       History obtained from unobtainable from patient due to mental status   Neurologic Examination:                                                                                                      Blood pressure 193/164, pulse 69, temperature 98.2 F (36.8 C), temperature source Oral, resp. rate 20, height 5\' 3"  (1.6 m), weight 78.5 kg (173 lb 1 oz), SpO2 98.00%. Mental Status: Patient will not follow verbal cammands, moaning, will open eyes to voice but not track finger. When trying to assess eyes she will forcefully try to close her  eyelids.   Cranial Nerves: II: Discs flat bilaterally; Visual fields blinks to threat on the left but inconsistent on the right, pupils equal, round, reactive to light and accommodation III,IV, VI: ptosis not present, left gaze preference, does not cross midline with dolls maneuver V,VII: face symmetric, grimaces bilaterally to periorbital noxious stimuli VIII: opens eyes when name is called IX,X: gag reflex present XI: bilateral shoulder shrug XII: unable to assess tongue  Motor: Withdraws right arm to noxious stimuli by bending arm at the elbow, cannot hold right arm antigravity. Bends right leg at hip and knee to noxious stimuli but does not hold antigravity.  Moves left arm with 3/5 strength and localizes to pain with left arm.  Left leg withdraws to noxious stimuli antigravity Tone and bulk:normal tone throughout; no atrophy noted Sensory: as above Deep Tendon Reflexes:  Right: Upper  Extremity   Left: Upper extremity   biceps (C-5 to C-6) 2/4   biceps (C-5 to C-6) 2/4 tricep (C7) 2/4    triceps (C7) 2/4 Brachioradialis (C6) 2/4  Brachioradialis (C6) 2/4  Lower Extremity Lower Extremity  quadriceps (L-2 to L-4) 2/4   quadriceps (L-2 to L-4) 2/4 Achilles (S1) 1/4   Achilles (S1) 1/4  Plantars: equivocal bilaterally Cerebellar: Unable to assess Gait: unable to assess CV: pulses palpable throughout    Lab Results: Basic Metabolic Panel:  Recent Labs Lab 08/27/13 1204 08/27/13 1214  NA 142 143  K 4.5 4.2  CL 100 101  CO2 27  --   GLUCOSE 107* 108*  BUN 16 17  CREATININE 1.01 1.20*  CALCIUM 9.5  --     Liver Function Tests:  Recent Labs Lab 08/27/13 1204  AST 25  ALT 19  ALKPHOS 97  BILITOT 0.5  PROT 7.6  ALBUMIN 3.7   No results found for this basename: LIPASE, AMYLASE,  in the last 168 hours No results found for this basename: AMMONIA,  in the last 168 hours  CBC:  Recent Labs Lab 08/27/13 1204 08/27/13 1214  WBC 6.2  --   NEUTROABS 3.4  --    HGB 15.1* 16.7*  HCT 45.8 49.0*  MCV 98.5  --   PLT 210  --     Cardiac Enzymes: No results found for this basename: CKTOTAL, CKMB, CKMBINDEX, TROPONINI,  in the last 168 hours  Lipid Panel: No results found for this basename: CHOL, TRIG, HDL, CHOLHDL, VLDL, LDLCALC,  in the last 168 hours  CBG:  Recent Labs Lab 08/27/13 1211 08/27/13 1547  GLUCAP 105* 120*    Microbiology: Results for orders placed in visit on 12/23/11  URINE CULTURE     Status: None   Collection Time    12/23/11 11:43 AM      Result Value Ref Range Status   Culture ENTEROCOCCUS SPECIES   Final   Colony Count 70,000 COLONIES/ML   Final   Organism ID, Bacteria ENTEROCOCCUS SPECIES   Final    Coagulation Studies:  Recent Labs  08/27/13 1204  LABPROT 15.3*  INR 1.24    Imaging: Ct Angio Head W/cm &/or Wo Cm  08/27/2013   CLINICAL DATA:  80 year old female code stroke. Symptoms suggesting small left hemisphere cortical infarct, symptoms improving status post tPA. Initial encounter.  EXAM: CT ANGIOGRAPHY HEAD AND NECK  TECHNIQUE: Multidetector CT imaging of the head and neck was performed using the standard protocol during bolus administration of intravenous contrast. Multiplanar CT image reconstructions and MIPs were obtained to evaluate the vascular anatomy. Carotid stenosis measurements (when applicable) are obtained utilizing NASCET criteria, using the distal internal carotid diameter as the denominator.  CONTRAST:  33mL OMNIPAQUE IOHEXOL 350 MG/ML SOLN  COMPARISON:  Non contrast head CT 1157 hr the same day. Neck and head MRA 06/28/2010.  FINDINGS: CTA HEAD FINDINGS  No abnormal enhancement identified. No midline shift, mass effect, or evidence of intracranial mass lesion. No ventriculomegaly. Stable bilateral cerebral white matter hypodensity. No evidence of cortically based acute infarction identified.  VASCULAR FINDINGS:  Major intracranial venous structures are patent. Dominant distal right vertebral  artery is patent without stenosis. Both PICA origins are patent. The non dominant left vertebral functionally terminates in PICA. Basilar artery is patent without stenosis. SCA and PCA origins are normal. Normal posterior communicating arteries. Normal bilateral PCA branches.  Patent right ICA siphon with heavy calcification in the cavernous segment. Moderate  stenosis at that site (series [id number], image 193). Calcified plaque continues into the supra clinoid segment. Right ophthalmic and posterior communicating artery origins are normal. Patent right ICA terminus.  Patent left ICA siphon with less pronounced calcified plaque and stenosis compared to the right side. Normal ophthalmic and posterior communicating artery origins. Patent left ICA terminus.  Left MCA M1 segment is patent and appears normal. Left MCA bifurcation is patent. No left MCA branch occlusion is identified.  ACA and right MCA origins are normal. Anterior communicating artery is normal. Bilateral ACA branches are within normal limits. Right MCA branches are within normal limits.  Review of the MIP images confirms the above findings.  CTA NECK FINDINGS  Negative lung apices. No superior mediastinal lymphadenopathy. Mild generalized thyroid heterogeneity an enlargement compatible with multinodular goiter. Negative larynx, pharynx, parapharyngeal spaces, retropharyngeal space, sublingual space, submandibular glands, and parotid glands. Leftward gaze deviation. Negative orbits soft tissues. Visualized paranasal sinuses and mastoids are clear. No cervical lymphadenopathy. No acute osseous abnormality identified. Widespread cervical spine degenerative changes, congruent with age.  VASCULAR FINDINGS:  Moderate arch soft and calcified plaque. Four vessel arch configuration with the left vertebral artery arising directly from the arch. No great vessel origin stenosis.  Normal right CCA origin. Mildly tortuous right CCA. Circumferential soft plaque without  stenosis. At the right carotid bifurcation there is calcified plaque resulting in less than 50 % stenosis with respect to the distal vessel. Mildly tortuous cervical right ICA otherwise is negative.  No proximal right subclavian artery stenosis. Calcified plaque at the right vertebral artery origin with only mild stenosis. Tortuous proximal right vertebral. The right vertebral is dominant and otherwise negative throughout the neck.  No left CCA stenosis with mild circumferential soft plaque. At the left carotid bifurcation there is soft and calcified plaque resulting in stenosis of up to 50 % with respect to the distal vessel. The on the bulb, the cervical left ICA is tortuous but otherwise normal.  Left vertebral artery origin off the arch is patent but irregular, with up to moderate stenosis. The left vertebral artery is nondominant and remains patent to the skullbase without additional lesions.  Review of the MIP images confirms the above findings.  IMPRESSION: 1. Bilateral carotid bifurcation and carotid siphon atherosclerosis, slightly worse on the right. No left carotid hemodynamically significant stenosis. No left MCA stenosis or major branch occlusion identified. 2. Stable CT appearance of the brain. 3. Dominant right vertebral artery. Origin of the non dominant left vertebral at the aortic arch appears stenotic, but the left vertebral remains patent.  Salient findings on this study reviewed in person with Dr. Mike Craze on 08/27/2013 at 1328 hrs.   Electronically Signed   By: Lars Pinks M.D.   On: 08/27/2013 13:49   Dg Chest 2 View  08/27/2013   CLINICAL DATA:  Code stroke, weakness, confusion.  EXAM: CHEST  2 VIEW  COMPARISON:  None.  FINDINGS: Prior median sternotomy, CABG and valve replacement. Cardiomegaly. Stable minimal lingular scarring. Lungs otherwise clear. No effusions or acute bony abnormality.  IMPRESSION: No active cardiopulmonary disease.   Electronically Signed   By: Rolm Baptise M.D.    On: 08/27/2013 15:12   Ct Head Wo Contrast  08/27/2013   CLINICAL DATA:  aphasia  EXAM: CT HEAD WITHOUT CONTRAST  TECHNIQUE: Contiguous axial images were obtained from the base of the skull through the vertex without intravenous contrast. Study was performed within 24 hr of patient's arrival at the emergency department.  COMPARISON:  Brain CT June 27, 2010 and brain MRI June 28, 2010  FINDINGS: There is mild diffuse atrophy. There is no mass, hemorrhage, extra-axial fluid collection, or midline shift. There is stable patchy small vessel disease in the centra semiovale bilaterally.  There is a focal area of decreased attenuation in the medial mid right pons concerning for acute infarct. No other findings felt to represent acute infarct present. Both middle cerebral artery show symmetric increased attenuation, a stable finding of questionable significance.  Bony calvarium appears intact.  The mastoid air cells are clear.  IMPRESSION: Suspect recent infarct in the medial right mid pontine region. There is stable atrophy with patchy periventricular small vessel disease. No hemorrhage or mass effect.  Critical Value/emergent results were called by telephone at the time of interpretation on 08/27/2013 at 12:18 PM to Dr. Leonel Ramsay, Neurology , who verbally acknowledged these results.   Electronically Signed   By: Lowella Grip M.D.   On: 08/27/2013 12:23   Ct Angio Neck W/cm &/or Wo/cm  08/27/2013   CLINICAL DATA:  80 year old female code stroke. Symptoms suggesting small left hemisphere cortical infarct, symptoms improving status post tPA. Initial encounter.  EXAM: CT ANGIOGRAPHY HEAD AND NECK  TECHNIQUE: Multidetector CT imaging of the head and neck was performed using the standard protocol during bolus administration of intravenous contrast. Multiplanar CT image reconstructions and MIPs were obtained to evaluate the vascular anatomy. Carotid stenosis measurements (when applicable) are obtained utilizing  NASCET criteria, using the distal internal carotid diameter as the denominator.  CONTRAST:  24mL OMNIPAQUE IOHEXOL 350 MG/ML SOLN  COMPARISON:  Non contrast head CT 1157 hr the same day. Neck and head MRA 06/28/2010.  FINDINGS: CTA HEAD FINDINGS  No abnormal enhancement identified. No midline shift, mass effect, or evidence of intracranial mass lesion. No ventriculomegaly. Stable bilateral cerebral white matter hypodensity. No evidence of cortically based acute infarction identified.  VASCULAR FINDINGS:  Major intracranial venous structures are patent. Dominant distal right vertebral artery is patent without stenosis. Both PICA origins are patent. The non dominant left vertebral functionally terminates in PICA. Basilar artery is patent without stenosis. SCA and PCA origins are normal. Normal posterior communicating arteries. Normal bilateral PCA branches.  Patent right ICA siphon with heavy calcification in the cavernous segment. Moderate stenosis at that site (series [id number], image 193). Calcified plaque continues into the supra clinoid segment. Right ophthalmic and posterior communicating artery origins are normal. Patent right ICA terminus.  Patent left ICA siphon with less pronounced calcified plaque and stenosis compared to the right side. Normal ophthalmic and posterior communicating artery origins. Patent left ICA terminus.  Left MCA M1 segment is patent and appears normal. Left MCA bifurcation is patent. No left MCA branch occlusion is identified.  ACA and right MCA origins are normal. Anterior communicating artery is normal. Bilateral ACA branches are within normal limits. Right MCA branches are within normal limits.  Review of the MIP images confirms the above findings.  CTA NECK FINDINGS  Negative lung apices. No superior mediastinal lymphadenopathy. Mild generalized thyroid heterogeneity an enlargement compatible with multinodular goiter. Negative larynx, pharynx, parapharyngeal spaces, retropharyngeal  space, sublingual space, submandibular glands, and parotid glands. Leftward gaze deviation. Negative orbits soft tissues. Visualized paranasal sinuses and mastoids are clear. No cervical lymphadenopathy. No acute osseous abnormality identified. Widespread cervical spine degenerative changes, congruent with age.  VASCULAR FINDINGS:  Moderate arch soft and calcified plaque. Four vessel arch configuration with the left vertebral artery arising directly from the arch. No  great vessel origin stenosis.  Normal right CCA origin. Mildly tortuous right CCA. Circumferential soft plaque without stenosis. At the right carotid bifurcation there is calcified plaque resulting in less than 50 % stenosis with respect to the distal vessel. Mildly tortuous cervical right ICA otherwise is negative.  No proximal right subclavian artery stenosis. Calcified plaque at the right vertebral artery origin with only mild stenosis. Tortuous proximal right vertebral. The right vertebral is dominant and otherwise negative throughout the neck.  No left CCA stenosis with mild circumferential soft plaque. At the left carotid bifurcation there is soft and calcified plaque resulting in stenosis of up to 50 % with respect to the distal vessel. The on the bulb, the cervical left ICA is tortuous but otherwise normal.  Left vertebral artery origin off the arch is patent but irregular, with up to moderate stenosis. The left vertebral artery is nondominant and remains patent to the skullbase without additional lesions.  Review of the MIP images confirms the above findings.  IMPRESSION: 1. Bilateral carotid bifurcation and carotid siphon atherosclerosis, slightly worse on the right. No left carotid hemodynamically significant stenosis. No left MCA stenosis or major branch occlusion identified. 2. Stable CT appearance of the brain. 3. Dominant right vertebral artery. Origin of the non dominant left vertebral at the aortic arch appears stenotic, but the left  vertebral remains patent.  Salient findings on this study reviewed in person with Dr. Mike Craze on 08/27/2013 at 1328 hrs.   Electronically Signed   By: Lars Pinks M.D.   On: 08/27/2013 13:49       Assessment and plan discussed with with attending physician and they are in agreement.    Etta Quill PA-C [facility] [phone]  08/27/2013, 5:39 PM     Stroke Risk Factors - atrial fibrillation, hyperlipidemia, hypertension and CAD, diabetes  Assessment: 80 y.o. female with left gaze preference and aphasia sudden onset sometime after 9:30am. She was within the window for IV tPA and has received this. I reviewed the CTA with no intervenable lesion. She will need to be admitted to the ICU for monitoring.   1. HgbA1c, fasting lipid panel 2. MRI, MRA  of the brain without contrast 3. Frequent neuro checks 4. Echocardiogram 5. Carotid dopplers 6. Prophylactic therapy-None 7. Risk factor modification 8. Telemetry monitoring 9. PT consult, OT consult, Speech consult    This patient is critically ill and at significant risk of neurological worsening, death and care requires constant monitoring of vital signs, hemodynamics,respiratory and cardiac monitoring, neurological assessment, discussion with family, other specialists and medical decision making of high complexity. I spent 60 minutes of neurocritical care time  in the care of  this patient.  Roland Rack, MD [facility] [phone]  If 7pm- 7am, please page neurology on call at [phone].  08/27/2013  5:39 PM

## 2013-08-27 NOTE — Progress Notes (Signed)
Stat EEG completed. Dr. Leonel Ramsay viewed EEG in progress.

## 2013-08-27 NOTE — Plan of Care (Signed)
Problem: tPA Day Progression Outcomes-Only if tPA administered Goal: Pre tPA foley catheter inserted Outcome: Not Met (add Reason) No foley cath inserted in ER on admit

## 2013-08-27 NOTE — ED Notes (Signed)
Per EMS pt from home, baseline- clear speech, alert and oriented x4. At 9:30 AM this morning family witnessed patient go into "semi-conscious" state not responding appropriately with high-pitched moaning. Difficulty following commands- possibly minimal weakness in left hand grip. Patient able to open eyes to name. EKF- afib with PVCs. Rate 65-100bpm   PMH CABG over 10 years ago

## 2013-08-27 NOTE — ED Provider Notes (Signed)
CSN: [id number]     Arrival date & time 08/27/13  1156 History   First MD Initiated Contact with Patient 08/27/13 1207     Chief Complaint  Patient presents with  . Code Stroke   Level V caveat due to aphasia An emergency department physician performed an initial assessment on this suspected stroke patient at 1156. (Consider location/radiation/quality/duration/timing/severity/associated sxs/prior Treatment) The history is provided by the patient and the EMS personnel.   patient came in as a code stroke. She was last normal at 9:30 this morning. Family member later came and found her where she would up and ambulate but not speak. Upon arrival to the ED she will only look to the left and will not follow commands. She will localize pain, but will not speak or follow commands for me. She has a history of atrophic relation as previous and on Coumadin, but is not now.  Past Medical History  Diagnosis Date  . Asthma   . Diabetes mellitus   . Hypertension   . Hyperlipidemia   . CAD (coronary artery disease)     s/p CABG s/p AO valve replacement ([place] [facility])  . Recurrent UTI   . Paroxysmal atrial fibrillation     cards d/c coumadin 12/2008 d/t persisten NSR and frequent falls- restarted coumadin july 2011  . Hyperthyroidism   . Dry skin   . Osteoarthritis   . Osteopenia     per dexa 12/09  . Keloid     @ chest  . Dizziness     Chronic, admiet 07-2010,saw neuro, thought to be a peripheral issue   . Anxiety 11/20/2011   Past Surgical History  Procedure Laterality Date  . Abdominal hysterectomy    . Total knee arthroplasty  1999  . Oophorectomy    . Cagb  1998  . Cesarean section      x 2  . Hemorrhoid surgery     Family History  Problem Relation Age of Onset  . Colon cancer Neg Hx   . Breast cancer Neg Hx   . Thyroid disease Neg Hx   . Coronary artery disease Son 59    deceased  . Heart attack Father   . Hypertension Brother   . Stroke Brother    History  Substance  Use Topics  . Smoking status: Former Research scientist (life sciences)  . Smokeless tobacco: Not on file     Comment: quit 2004, smoked 1.5 ppd  . Alcohol Use: No   OB History   Grav Para Term Preterm Abortions TAB SAB Ect Mult Living                 Review of Systems  Unable to perform ROS     Allergies  Hydrocodone and Tramadol hcl  Home Medications  No current outpatient prescriptions on file. BP 167/62  Pulse 80  Temp(Src) 98.2 F (36.8 C) (Oral)  Resp 17  Ht 5\' 3"  (1.6 m)  Wt 173 lb 1 oz (78.5 kg)  BMI 30.66 kg/m2  SpO2 99% Physical Exam  Constitutional: She appears well-developed and well-nourished.  HENT:  Head: Normocephalic.  Eyes:  Patient has gaze to left and will not cross the midline to the right.  Neck: Neck supple.  Cardiovascular: Normal rate.   Pulmonary/Chest: Effort normal and breath sounds normal.  Abdominal: Soft. Bowel sounds are normal.  Neurological:  Patient also gives rise) she will sometimes open eyes to voice. She has had some movement of both extremities. Of her left arm  is raised up she will hold it up and eventually sat down. Her right arm she will also do the same, but appears to be somewhat weaker forming. She does move both lower legs. Eyes are deviated to the left. She is nonverbal forming and will not follow commands. She does localize pain across least her left hand over to her right shoulder for pain. She also reaches with her left hand to her left shoulder.  Skin: Skin is warm.    ED Course  Procedures (including critical care time) Labs Review Labs Reviewed  PROTIME-INR - Abnormal; Notable for the following:    Prothrombin Time 15.3 (*)    All other components within normal limits  CBC - Abnormal; Notable for the following:    Hemoglobin 15.1 (*)    All other components within normal limits  DIFFERENTIAL - Abnormal; Notable for the following:    Monocytes Relative 15 (*)    All other components within normal limits  COMPREHENSIVE METABOLIC PANEL  - Abnormal; Notable for the following:    Glucose, Bld 107 (*)    GFR calc non Af Amer 48 (*)    GFR calc Af Amer 56 (*)    All other components within normal limits  URINALYSIS, ROUTINE W REFLEX MICROSCOPIC - Abnormal; Notable for the following:    Specific Gravity, Urine 1.038 (*)    Leukocytes, UA SMALL (*)    All other components within normal limits  URINE MICROSCOPIC-ADD ON - Abnormal; Notable for the following:    Squamous Epithelial / LPF FEW (*)    All other components within normal limits  I-STAT CHEM 8, ED - Abnormal; Notable for the following:    Creatinine, Ser 1.20 (*)    Glucose, Bld 108 (*)    Hemoglobin 16.7 (*)    HCT 49.0 (*)    All other components within normal limits  CBG MONITORING, ED - Abnormal; Notable for the following:    Glucose-Capillary 105 (*)    All other components within normal limits  MRSA PCR SCREENING  ETHANOL  APTT  URINE RAPID DRUG SCREEN (HOSP PERFORMED)  I-STAT TROPOININ, ED  I-STAT TROPOININ, ED   Imaging Review Ct Angio Head W/cm &/or Wo Cm  08/27/2013   CLINICAL DATA:  80 year old female code stroke. Symptoms suggesting small left hemisphere cortical infarct, symptoms improving status post tPA. Initial encounter.  EXAM: CT ANGIOGRAPHY HEAD AND NECK  TECHNIQUE: Multidetector CT imaging of the head and neck was performed using the standard protocol during bolus administration of intravenous contrast. Multiplanar CT image reconstructions and MIPs were obtained to evaluate the vascular anatomy. Carotid stenosis measurements (when applicable) are obtained utilizing NASCET criteria, using the distal internal carotid diameter as the denominator.  CONTRAST:  24mL OMNIPAQUE IOHEXOL 350 MG/ML SOLN  COMPARISON:  Non contrast head CT 1157 hr the same day. Neck and head MRA 06/28/2010.  FINDINGS: CTA HEAD FINDINGS  No abnormal enhancement identified. No midline shift, mass effect, or evidence of intracranial mass lesion. No ventriculomegaly. Stable  bilateral cerebral white matter hypodensity. No evidence of cortically based acute infarction identified.  VASCULAR FINDINGS:  Major intracranial venous structures are patent. Dominant distal right vertebral artery is patent without stenosis. Both PICA origins are patent. The non dominant left vertebral functionally terminates in PICA. Basilar artery is patent without stenosis. SCA and PCA origins are normal. Normal posterior communicating arteries. Normal bilateral PCA branches.  Patent right ICA siphon with heavy calcification in the cavernous segment. Moderate stenosis at  that site (series [id number], image 193). Calcified plaque continues into the supra clinoid segment. Right ophthalmic and posterior communicating artery origins are normal. Patent right ICA terminus.  Patent left ICA siphon with less pronounced calcified plaque and stenosis compared to the right side. Normal ophthalmic and posterior communicating artery origins. Patent left ICA terminus.  Left MCA M1 segment is patent and appears normal. Left MCA bifurcation is patent. No left MCA branch occlusion is identified.  ACA and right MCA origins are normal. Anterior communicating artery is normal. Bilateral ACA branches are within normal limits. Right MCA branches are within normal limits.  Review of the MIP images confirms the above findings.  CTA NECK FINDINGS  Negative lung apices. No superior mediastinal lymphadenopathy. Mild generalized thyroid heterogeneity an enlargement compatible with multinodular goiter. Negative larynx, pharynx, parapharyngeal spaces, retropharyngeal space, sublingual space, submandibular glands, and parotid glands. Leftward gaze deviation. Negative orbits soft tissues. Visualized paranasal sinuses and mastoids are clear. No cervical lymphadenopathy. No acute osseous abnormality identified. Widespread cervical spine degenerative changes, congruent with age.  VASCULAR FINDINGS:  Moderate arch soft and calcified plaque. Four vessel  arch configuration with the left vertebral artery arising directly from the arch. No great vessel origin stenosis.  Normal right CCA origin. Mildly tortuous right CCA. Circumferential soft plaque without stenosis. At the right carotid bifurcation there is calcified plaque resulting in less than 50 % stenosis with respect to the distal vessel. Mildly tortuous cervical right ICA otherwise is negative.  No proximal right subclavian artery stenosis. Calcified plaque at the right vertebral artery origin with only mild stenosis. Tortuous proximal right vertebral. The right vertebral is dominant and otherwise negative throughout the neck.  No left CCA stenosis with mild circumferential soft plaque. At the left carotid bifurcation there is soft and calcified plaque resulting in stenosis of up to 50 % with respect to the distal vessel. The on the bulb, the cervical left ICA is tortuous but otherwise normal.  Left vertebral artery origin off the arch is patent but irregular, with up to moderate stenosis. The left vertebral artery is nondominant and remains patent to the skullbase without additional lesions.  Review of the MIP images confirms the above findings.  IMPRESSION: 1. Bilateral carotid bifurcation and carotid siphon atherosclerosis, slightly worse on the right. No left carotid hemodynamically significant stenosis. No left MCA stenosis or major branch occlusion identified. 2. Stable CT appearance of the brain. 3. Dominant right vertebral artery. Origin of the non dominant left vertebral at the aortic arch appears stenotic, but the left vertebral remains patent.  Salient findings on this study reviewed in person with Dr. Mike Craze on 08/27/2013 at 1328 hrs.   Electronically Signed   By: Lars Pinks M.D.   On: 08/27/2013 13:49   Dg Chest 2 View  08/27/2013   CLINICAL DATA:  Code stroke, weakness, confusion.  EXAM: CHEST  2 VIEW  COMPARISON:  None.  FINDINGS: Prior median sternotomy, CABG and valve replacement.  Cardiomegaly. Stable minimal lingular scarring. Lungs otherwise clear. No effusions or acute bony abnormality.  IMPRESSION: No active cardiopulmonary disease.   Electronically Signed   By: Rolm Baptise M.D.   On: 08/27/2013 15:12   Ct Head Wo Contrast  08/27/2013   CLINICAL DATA:  aphasia  EXAM: CT HEAD WITHOUT CONTRAST  TECHNIQUE: Contiguous axial images were obtained from the base of the skull through the vertex without intravenous contrast. Study was performed within 24 hr of patient's arrival at the emergency department.  COMPARISON:  Brain CT June 27, 2010 and brain MRI June 28, 2010  FINDINGS: There is mild diffuse atrophy. There is no mass, hemorrhage, extra-axial fluid collection, or midline shift. There is stable patchy small vessel disease in the centra semiovale bilaterally.  There is a focal area of decreased attenuation in the medial mid right pons concerning for acute infarct. No other findings felt to represent acute infarct present. Both middle cerebral artery show symmetric increased attenuation, a stable finding of questionable significance.  Bony calvarium appears intact.  The mastoid air cells are clear.  IMPRESSION: Suspect recent infarct in the medial right mid pontine region. There is stable atrophy with patchy periventricular small vessel disease. No hemorrhage or mass effect.  Critical Value/emergent results were called by telephone at the time of interpretation on 08/27/2013 at 12:18 PM to Dr. Leonel Ramsay, Neurology , who verbally acknowledged these results.   Electronically Signed   By: Lowella Grip M.D.   On: 08/27/2013 12:23   Ct Angio Neck W/cm &/or Wo/cm  08/27/2013   CLINICAL DATA:  80 year old female code stroke. Symptoms suggesting small left hemisphere cortical infarct, symptoms improving status post tPA. Initial encounter.  EXAM: CT ANGIOGRAPHY HEAD AND NECK  TECHNIQUE: Multidetector CT imaging of the head and neck was performed using the standard protocol during  bolus administration of intravenous contrast. Multiplanar CT image reconstructions and MIPs were obtained to evaluate the vascular anatomy. Carotid stenosis measurements (when applicable) are obtained utilizing NASCET criteria, using the distal internal carotid diameter as the denominator.  CONTRAST:  64mL OMNIPAQUE IOHEXOL 350 MG/ML SOLN  COMPARISON:  Non contrast head CT 1157 hr the same day. Neck and head MRA 06/28/2010.  FINDINGS: CTA HEAD FINDINGS  No abnormal enhancement identified. No midline shift, mass effect, or evidence of intracranial mass lesion. No ventriculomegaly. Stable bilateral cerebral white matter hypodensity. No evidence of cortically based acute infarction identified.  VASCULAR FINDINGS:  Major intracranial venous structures are patent. Dominant distal right vertebral artery is patent without stenosis. Both PICA origins are patent. The non dominant left vertebral functionally terminates in PICA. Basilar artery is patent without stenosis. SCA and PCA origins are normal. Normal posterior communicating arteries. Normal bilateral PCA branches.  Patent right ICA siphon with heavy calcification in the cavernous segment. Moderate stenosis at that site (series [id number], image 193). Calcified plaque continues into the supra clinoid segment. Right ophthalmic and posterior communicating artery origins are normal. Patent right ICA terminus.  Patent left ICA siphon with less pronounced calcified plaque and stenosis compared to the right side. Normal ophthalmic and posterior communicating artery origins. Patent left ICA terminus.  Left MCA M1 segment is patent and appears normal. Left MCA bifurcation is patent. No left MCA branch occlusion is identified.  ACA and right MCA origins are normal. Anterior communicating artery is normal. Bilateral ACA branches are within normal limits. Right MCA branches are within normal limits.  Review of the MIP images confirms the above findings.  CTA NECK FINDINGS  Negative  lung apices. No superior mediastinal lymphadenopathy. Mild generalized thyroid heterogeneity an enlargement compatible with multinodular goiter. Negative larynx, pharynx, parapharyngeal spaces, retropharyngeal space, sublingual space, submandibular glands, and parotid glands. Leftward gaze deviation. Negative orbits soft tissues. Visualized paranasal sinuses and mastoids are clear. No cervical lymphadenopathy. No acute osseous abnormality identified. Widespread cervical spine degenerative changes, congruent with age.  VASCULAR FINDINGS:  Moderate arch soft and calcified plaque. Four vessel arch configuration with the left vertebral artery arising directly from the arch. No great vessel  origin stenosis.  Normal right CCA origin. Mildly tortuous right CCA. Circumferential soft plaque without stenosis. At the right carotid bifurcation there is calcified plaque resulting in less than 50 % stenosis with respect to the distal vessel. Mildly tortuous cervical right ICA otherwise is negative.  No proximal right subclavian artery stenosis. Calcified plaque at the right vertebral artery origin with only mild stenosis. Tortuous proximal right vertebral. The right vertebral is dominant and otherwise negative throughout the neck.  No left CCA stenosis with mild circumferential soft plaque. At the left carotid bifurcation there is soft and calcified plaque resulting in stenosis of up to 50 % with respect to the distal vessel. The on the bulb, the cervical left ICA is tortuous but otherwise normal.  Left vertebral artery origin off the arch is patent but irregular, with up to moderate stenosis. The left vertebral artery is nondominant and remains patent to the skullbase without additional lesions.  Review of the MIP images confirms the above findings.  IMPRESSION: 1. Bilateral carotid bifurcation and carotid siphon atherosclerosis, slightly worse on the right. No left carotid hemodynamically significant stenosis. No left MCA  stenosis or major branch occlusion identified. 2. Stable CT appearance of the brain. 3. Dominant right vertebral artery. Origin of the non dominant left vertebral at the aortic arch appears stenotic, but the left vertebral remains patent.  Salient findings on this study reviewed in person with Dr. Mike Craze on 08/27/2013 at 1328 hrs.   Electronically Signed   By: Lars Pinks M.D.   On: 08/27/2013 13:49     EKG Interpretation None      MDM   Final diagnoses:  Stroke     Patient presented as a code stroke. Last normal was around 2 and half hours prior to arrival. After discussion with neurology the decision was to give TPA with a history of a left-sided gaze, possible dense MCA on CT, and a history of atrial fibrillation. Discussed the choice with family members. There was no time for emergent MRI before the 3 hour window for TPA would be done.  Seizures were considered, but the decision was made to give TPA due to the time constraints. There is no other easily available cause of the deficits that was found.   CRITICAL CARE Performed by: Mackie Pai Total critical care time:30 Critical care time was exclusive of separately billable procedures and treating other patients. Critical care was necessary to treat or prevent imminent or life-threatening deterioration. Critical care was time spent personally by me on the following activities: development of treatment plan with patient and/or surrogate as well as nursing, discussions with consultants, evaluation of patient's response to treatment, examination of patient, obtaining history from patient or surrogate, ordering and performing treatments and interventions, ordering and review of laboratory studies, ordering and review of radiographic studies, pulse oximetry and re-evaluation of patient's condition.    Jasper Riling. Alvino Chapel, MD 08/27/13 [phone]

## 2013-08-27 NOTE — Consult Note (Deleted)
Referring Physician: Alvino Chapel    Chief Complaint: code stroke  HPI:                                                                                                                                         Rebecca Skinner is an 80 y.o. female who was last seen normal at her baseline at 9:30 AM. later the nephew came home and found patient not responding appropriately, moaning, but did not note any Jerking motion. Patient was brought to [facility] hospital as a code stroke.  On arrival it was noted patient would not follow commands, moaning, left gaze deviation.   There was concern for both stroke versus status epilepticus.  Initial CT head showed no acute bleed. It was discussed with patients family about need for tPA and family was in agreement.  In addition EEG was called to obtain a stat EEG. Patient was also brought to CT a second time to evaluate for large vessel occlusion.   Date last known well: Date: 08/27/2013 Time last known well: Time: 09:30 tPA Given: Yes  Past Medical History  Diagnosis Date  . Asthma   . Diabetes mellitus   . Hypertension   . Hyperlipidemia   . CAD (coronary artery disease)     s/p CABG s/p AO valve replacement ([place] [facility])  . Recurrent UTI   . Paroxysmal atrial fibrillation     cards d/c coumadin 12/2008 d/t persisten NSR and frequent falls- restarted coumadin july 2011  . Hyperthyroidism   . Dry skin   . Osteoarthritis   . Osteopenia     per dexa 12/09  . Keloid     @ chest  . Dizziness     Chronic, admiet 07-2010,saw neuro, thought to be a peripheral issue   . Anxiety 11/20/2011    Past Surgical History  Procedure Laterality Date  . Abdominal hysterectomy    . Total knee arthroplasty  1999  . Oophorectomy    . Cagb  1998  . Cesarean section      x 2  . Hemorrhoid surgery      Family History  Problem Relation Age of Onset  . Colon cancer Neg Hx   . Breast cancer Neg Hx   . Thyroid disease Neg Hx   . Coronary artery disease Son 57    deceased   . Heart attack Father   . Hypertension Brother   . Stroke Brother    Social History:  reports that she has quit smoking. She does not have any smokeless tobacco history on file. She reports that she does not drink alcohol or use illicit drugs.  Allergies:  Allergies  Allergen Reactions  . Hydrocodone     REACTION: itching/nausea  . Tramadol Hcl     REACTION: itching , upset stomach (08-2009)    Medications:  Current Facility-Administered Medications  Medication Dose Route Frequency Provider Last Rate Last Dose  . alteplase (ACTIVASE) 1 mg/mL infusion 71 mg  0.9 mg/kg (Order-Specific) Intravenous Once [employer]. Pickering, MD      . LORazepam (ATIVAN) injection 1 mg  1 mg Intravenous STAT Marliss Coots, PA-C       Current Outpatient Prescriptions  Medication Sig Dispense Refill  . albuterol (VENTOLIN HFA) 108 (90 BASE) MCG/ACT inhaler Inhale 2 puffs into the lungs every 6 (six) hours as needed for wheezing.  1 Inhaler  3  . ALPRAZolam (XANAX) 0.5 MG tablet TAKE 1 TABLET BY MOUTH 3 TIMES A DAY AS NEEDED  60 tablet  3  . amLODipine (NORVASC) 5 MG tablet TAKE 1 TABLET BY MOUTH DAILY  30 tablet  0  . aspirin 81 MG tablet Take 81 mg by mouth daily.        Marland Kitchen atorvastatin (LIPITOR) 40 MG tablet TAKE 1 TABLET BY MOUTH AT BEDTIME  30 tablet  12  . beclomethasone (QVAR) 80 MCG/ACT inhaler Inhale 2 puffs into the lungs 2 (two) times daily.  1 Inhaler  6  . benazepril (LOTENSIN) 20 MG tablet Take 20 mg by mouth daily.       . Calcium Carbonate (CALCIUM 500 PO) Take 500 mg by mouth daily.      . cholecalciferol (VITAMIN D) 1000 UNITS tablet Take 1,000 Units by mouth daily.        . clopidogrel (PLAVIX) 75 MG tablet Take 1 tablet (75 mg total) by mouth daily.  30 tablet  6  . cyclobenzaprine (FLEXERIL) 10 MG tablet TAKE 1 TABLET AT BEDTIME AS NEEDED  30 tablet  1  .  fluocinonide cream (LIDEX) 0.05 % USED AS DIRECTED.  30 g  0  . furosemide (LASIX) 20 MG tablet TAKE 1 TABLET BY MOUTH EVERY DAY  30 tablet  2  . JANUVIA 50 MG tablet TAKE 1 TABLET BY MOUTH EVERY DAY  30 tablet  4  . meclizine (ANTIVERT) 12.5 MG tablet Take 1 tablet (12.5 mg total) by mouth 3 (three) times daily as needed.  30 tablet  0  . methimazole (TAPAZOLE) 5 MG tablet 1 tab, 3 times a week (M, W, F)  40 tablet  0  . metoprolol tartrate (LOPRESSOR) 25 MG tablet TAKE 1 TABLET (25 MG TOTAL) BY MOUTH 3 (THREE) TIMES DAILY.  90 tablet  5  . NEXIUM 40 MG capsule TAKE 1 CAPSULE (40 MG TOTAL) BY MOUTH DAILY BEFORE BREAKFAST.  30 capsule  5  . nitroGLYCERIN (NITROSTAT) 0.3 MG SL tablet Place 1 tablet (0.3 mg total) under the tongue every 5 (five) minutes as needed.  30 tablet  3  . ONE TOUCH ULTRA TEST test strip TEST EVERY DAY AS DIRECTED  100 each  11  . ONETOUCH DELICA LANCETS 18H MISC TEST EVERY DAY AS DIRECTED  200 each  2  . TRAVATAN Z 0.004 % SOLN ophthalmic solution       . [DISCONTINUED] potassium chloride (KLOR-CON 10) 10 MEQ CR tablet Take 1 tablet (10 mEq total) by mouth daily.  10 tablet  0     ROS:  History obtained from unobtainable from patient due to mental status   Neurologic Examination:                                                                                                      Blood pressure 165/99, pulse 80, resp. rate 18, SpO2 100.00%. Mental Status: Patient will not follow verbal cammands, moaning, will open eyes to voice but not track finger. When trying to assess eyes she will forcefully try to close her eyelids.   Cranial Nerves: II: Discs flat bilaterally; Visual fields blinks to threat on the left but inconsistent on the right, pupils equal, round, reactive to light and accommodation III,IV, VI: ptosis not present, left gaze  preference, does not cross midline with dolls maneuver V,VII: face symmetric, grimaces bilaterally to periorbital noxious stimuli VIII: opens eyes when name is called IX,X: gag reflex present XI: bilateral shoulder shrug XII: unable to assess tongue  Motor: Withdraws right arm to noxious stimuli by bending arm at the elbow, cannot hold right arm antigravity. Bends right leg at hip and knee to noxious stimuli but does not hold antigravity.  Moves left arm with 3/5 strength and localizes to pain with left arm.  Left leg withdraws to noxious stimuli antigravity Tone and bulk:normal tone throughout; no atrophy noted Sensory: as above Deep Tendon Reflexes:  Right: Upper Extremity   Left: Upper extremity   biceps (C-5 to C-6) 2/4   biceps (C-5 to C-6) 2/4 tricep (C7) 2/4    triceps (C7) 2/4 Brachioradialis (C6) 2/4  Brachioradialis (C6) 2/4  Lower Extremity Lower Extremity  quadriceps (L-2 to L-4) 2/4   quadriceps (L-2 to L-4) 2/4 Achilles (S1) 1/4   Achilles (S1) 1/4  Plantars: equivocal bilaterally Cerebellar: Unable to assess Gait: unable to assess CV: pulses palpable throughout    Lab Results: Basic Metabolic Panel:  Recent Labs Lab 08/27/13 1214  NA 143  K 4.2  CL 101  GLUCOSE 108*  BUN 17  CREATININE 1.20*    Liver Function Tests: No results found for this basename: AST, ALT, ALKPHOS, BILITOT, PROT, ALBUMIN,  in the last 168 hours No results found for this basename: LIPASE, AMYLASE,  in the last 168 hours No results found for this basename: AMMONIA,  in the last 168 hours  CBC:  Recent Labs Lab 08/27/13 1214  HGB 16.7*  HCT 49.0*    Cardiac Enzymes: No results found for this basename: CKTOTAL, CKMB, CKMBINDEX, TROPONINI,  in the last 168 hours  Lipid Panel: No results found for this basename: CHOL, TRIG, HDL, CHOLHDL, VLDL, LDLCALC,  in the last 168 hours  CBG:  Recent Labs Lab 08/27/13 1211  GLUCAP 105*    Microbiology: Results for orders  placed in visit on 12/23/11  URINE CULTURE     Status: None   Collection Time    12/23/11 11:43 AM      Result Value Ref Range Status   Culture ENTEROCOCCUS SPECIES   Final   Colony Count 70,000 COLONIES/ML   Final   Organism ID, Bacteria ENTEROCOCCUS SPECIES   Final  Coagulation Studies: No results found for this basename: LABPROT, INR,  in the last 72 hours  Imaging: Ct Head Wo Contrast  08/27/2013   CLINICAL DATA:  aphasia  EXAM: CT HEAD WITHOUT CONTRAST  TECHNIQUE: Contiguous axial images were obtained from the base of the skull through the vertex without intravenous contrast. Study was performed within 24 hr of patient's arrival at the emergency department.  COMPARISON:  Brain CT June 27, 2010 and brain MRI June 28, 2010  FINDINGS: There is mild diffuse atrophy. There is no mass, hemorrhage, extra-axial fluid collection, or midline shift. There is stable patchy small vessel disease in the centra semiovale bilaterally.  There is a focal area of decreased attenuation in the medial mid right pons concerning for acute infarct. No other findings felt to represent acute infarct present. Both middle cerebral artery show symmetric increased attenuation, a stable finding of questionable significance.  Bony calvarium appears intact.  The mastoid air cells are clear.  IMPRESSION: Suspect recent infarct in the medial right mid pontine region. There is stable atrophy with patchy periventricular small vessel disease. No hemorrhage or mass effect.  Critical Value/emergent results were called by telephone at the time of interpretation on 08/27/2013 at 12:18 PM to Dr. Leonel Ramsay, Neurology , who verbally acknowledged these results.   Electronically Signed   By: Lowella Grip M.D.   On: 08/27/2013 12:23       Assessment and plan discussed with with attending physician and they are in agreement.    Etta Quill PA-C [facility] [phone]  08/27/2013, 12:27 PM     Stroke Risk  Factors - atrial fibrillation, hyperlipidemia, hypertension and CAD, diabetes  Assessment: 80 y.o. female with left gaze preference and aphasia sudden onset sometime after 9:30am. She was within the window for IV tPA and has received this. I reviewed the CTA with no intervenable lesion. She will need to be admitted to the ICU for monitoring.   1. HgbA1c, fasting lipid panel 2. MRI, MRA  of the brain without contrast 3. Frequent neuro checks 4. Echocardiogram 5. Carotid dopplers 6. Prophylactic therapy-None 7. Risk factor modification 8. Telemetry monitoring 9. PT consult, OT consult, Speech consult    This patient is critically ill and at significant risk of neurological worsening, death and care requires constant monitoring of vital signs, hemodynamics,respiratory and cardiac monitoring, neurological assessment, discussion with family, other specialists and medical decision making of high complexity. I spent 60 minutes of neurocritical care time  in the care of  this patient.  Roland Rack, MD [facility] [phone]  If 7pm- 7am, please page neurology on call at [phone].  08/27/2013  1:37 PM

## 2013-08-27 NOTE — Code Documentation (Signed)
80 year old female presents to [facility] via [place] as code stroke.  Family reports seeing her at 0730 and all was WNL.  Family spoke to her on phone at 0930 and speech was clear.  Later a family member went to house and patient was unable to speak.  EMS was called who called the code stroke in the field.  On arrival patient was stuporous not following commands forced gaze to the left right facial droop right side weakness and aphasic.  NIHSS 25.  See doc flow sheet for details.  No acute abnormalities on initial CT scan.  tPA administered at 1229.  CTA performed and patient became much more alert following some commands able to move all extremities - still with aphasia - will say OK to every question.  Normal gaze.  BP down to 105/60 - NS bolus 250 cc IV given.  EEG tech here performing EEG.  Handoff to [employer].

## 2013-08-27 NOTE — ED Notes (Addendum)
This RN transporting patient to chest xray and then ICU.  Neuro check preformed in Xray- no changes noted. HR 92, SpO2- 95%/RA, BP 178/65

## 2013-08-27 NOTE — Telephone Encounter (Signed)
Patient's daughter calls on triage line and states that mother can hear everything that is said, but is unable to talk. Daughter states that this has been going on for an hour.  She denied facial drooping and weakness.  Daughter was encouraged to call EMS.  She stated understanding and agreed with plan.

## 2013-08-27 NOTE — Progress Notes (Signed)
*  PRELIMINARY RESULTS* Vascular Ultrasound Carotid Duplex (Doppler) has been completed.  Findings suggest 1-39% internal carotid artery stenosis bilaterally. Vertebral arteries are patent with antegrade flow.  08/27/2013 5:43 PM Maudry Mayhew, RVT, RDCS, RDMS

## 2013-08-27 NOTE — ED Notes (Signed)
Pt at CT angio transported by this RN and stroke team. TPA still being transfused. No obvious bleeding noted.

## 2013-08-27 NOTE — Procedures (Signed)
History: 80 yo F with left gaze preference and aphasia  Sedation: None  Technique: This is a 17 channel routine scalp EEG performed at the bedside with bipolar and monopolar montages arranged in accordance to the international 10/20 system of electrode placement. One channel was dedicated to EKG recording.    Background: The background consists of intermixed alpha and beta activities.There is a well defined posterior dominant rhythm of 8 Hz that attenuates with eye opening. There is persistent bilateral muscle artifact that appears rhythmic in nature throughout most of the recording, but when the patient is asked to open her mouth, it completely abolishes and no underlying discharges are seen.   Photic stimulation: Physiologic driving is not performed  EEG Abnormalities: None  Clinical Interpretation: This normal EEG is recorded in the waking and sleep state. There was no seizure or seizure predisposition recorded on this study. The jaw movement artifact could be suggestive of tremor involving the jaw.   Roland Rack, MD [facility] [phone]  If 7pm- 7am, please page neurology on call as listed in [place].

## 2013-08-27 NOTE — ED Notes (Signed)
EEG being conducted 

## 2013-08-28 ENCOUNTER — Inpatient Hospital Stay (HOSPITAL_COMMUNITY): Payer: Medicare Other

## 2013-08-28 LAB — LIPID PANEL
Cholesterol: 111 mg/dL (ref 0–200)
HDL: 36 mg/dL — ABNORMAL LOW (ref >39)
LDL CALC: 60 mg/dL (ref 0–99)
Total CHOL/HDL Ratio: 3.1 RATIO
Triglycerides: 77 mg/dL (ref <150)
VLDL: 15 mg/dL (ref 0–40)

## 2013-08-28 LAB — CBC
HCT: 37 % (ref 36.0–46.0)
HEMOGLOBIN: 12.4 g/dL (ref 12.0–15.0)
MCH: 32.3 pg (ref 26.0–34.0)
MCHC: 33.5 g/dL (ref 30.0–36.0)
MCV: 96.4 fL (ref 78.0–100.0)
Platelets: 161 10*3/uL (ref 150–400)
RBC: 3.84 MIL/uL — AB (ref 3.87–5.11)
RDW: 13.4 % (ref 11.5–15.5)
WBC: 6.4 10*3/uL (ref 4.0–10.5)

## 2013-08-28 LAB — BASIC METABOLIC PANEL
BUN: 9 mg/dL (ref 6–23)
CHLORIDE: 101 meq/L (ref 96–112)
CO2: 22 meq/L (ref 19–32)
Calcium: 8.5 mg/dL (ref 8.4–10.5)
Creatinine, Ser: 0.65 mg/dL (ref 0.50–1.10)
GFR calc Af Amer: 89 mL/min — ABNORMAL LOW (ref >90)
GFR calc non Af Amer: 77 mL/min — ABNORMAL LOW (ref >90)
Glucose, Bld: 136 mg/dL — ABNORMAL HIGH (ref 70–99)
Potassium: 3.4 mEq/L — ABNORMAL LOW (ref 3.7–5.3)
SODIUM: 137 meq/L (ref 137–147)

## 2013-08-28 LAB — GLUCOSE, CAPILLARY
GLUCOSE-CAPILLARY: 175 mg/dL — AB (ref 70–99)
Glucose-Capillary: 89 mg/dL (ref 70–99)
Glucose-Capillary: 105 mg/dL — ABNORMAL HIGH (ref 70–99)

## 2013-08-28 MED ORDER — HEPARIN SODIUM (PORCINE) 5000 UNIT/ML IJ SOLN
5000.0000 [IU] | Freq: Three times a day (TID) | INTRAMUSCULAR | Status: DC
  Administered 2013-08-28 – 2013-08-31 (×9): 5000 [IU] via SUBCUTANEOUS
  Filled 2013-08-28 (×12): qty 1

## 2013-08-28 MED ORDER — ASPIRIN 325 MG PO TABS
325.0000 mg | ORAL_TABLET | Freq: Every day | ORAL | Status: DC
  Administered 2013-08-28 – 2013-08-30 (×3): 325 mg via ORAL
  Filled 2013-08-28 (×5): qty 1

## 2013-08-28 MED ORDER — INSULIN ASPART 100 UNIT/ML ~~LOC~~ SOLN
2.0000 [IU] | SUBCUTANEOUS | Status: DC
  Administered 2013-08-28: 4 [IU] via SUBCUTANEOUS
  Administered 2013-08-30 (×2): 2 [IU] via SUBCUTANEOUS

## 2013-08-28 NOTE — Evaluation (Signed)
Clinical/Bedside Swallow Evaluation Patient Details  Name: Rebecca Skinner MRN: [id number] Date of Birth: Jul 08, 1922  Today's Date: 08/28/2013 Time: 1355-1410 SLP Time Calculation (min): 15 min  Past Medical History:  Past Medical History  Diagnosis Date  . Asthma   . Diabetes mellitus   . Hypertension   . Hyperlipidemia   . CAD (coronary artery disease)     s/p CABG s/p AO valve replacement ([place] [facility])  . Recurrent UTI   . Paroxysmal atrial fibrillation     cards d/c coumadin 12/2008 d/t persisten NSR and frequent falls- restarted coumadin july 2011  . Hyperthyroidism   . Dry skin   . Osteoarthritis   . Osteopenia     per dexa 12/09  . Keloid     @ chest  . Dizziness     Chronic, admiet 07-2010,saw neuro, thought to be a peripheral issue   . Anxiety 11/20/2011   Past Surgical History:  Past Surgical History  Procedure Laterality Date  . Abdominal hysterectomy    . Total knee arthroplasty  1999  . Oophorectomy    . Cagb  1998  . Cesarean section      x 2  . Hemorrhoid surgery     HPI:   80 y.o. female with left gaze preference and aphasia sudden onset sometime after 9:30am. She was within the window for IV tPA and has received this.  MRI: IMPRESSION: 1. Acute ischemic infarct involving the left anterior frontal lobe, left operculum, and left insular cortex. No evidence of hemorrhagic conversion status post tPA. 2. Remote right pontine lacunar infarct. 3. Advanced age-related atrophy with chronic microvascular ischemic disease.   Assessment / Plan / Recommendation Clinical Impression  Pt presents with normal oropharyngeal swallow.  No CN deficits.  Active mastication, swift swallow trigger, and no s/s of aspiration.  Recommend resuming a regular consistency diet with thin liquids; meds whole with liquids.  No f/u for swallowing.      Aspiration Risk  Mild    Diet Recommendation Regular;Thin liquid   Liquid Administration via: Cup;Straw Medication Administration:  Whole meds with liquid Supervision: Patient able to self feed    Other  Recommendations Oral Care Recommendations: Oral care BID   Follow Up Recommendations  None     Swallow Study Prior Functional Status       General Date of Onset: 08/27/13 HPI:  80 y.o. female ft gaze preference and aphasia sudden onset sometime after 9:30am. She was within the window for IV tPA and has received this.  MRI: IMPRESSION: 1. Acute ischemic infarct involving the left anterior frontal lobe, left operculum, and left insular cortex. No evidence of hemorrhagic conversion status post tPA. 2. Remote right pontine lacunar infarct. 3. Advanced age-related atrophy with chronic microvascular ischemic disease. Type of Study: Bedside swallow evaluation Previous Swallow Assessment: none per records Diet Prior to this Study: NPO Temperature Spikes Noted: No Respiratory Status: Room air Behavior/Cognition: Alert;Cooperative;Pleasant mood Oral Cavity - Dentition: Dentures, top;Dentures, bottom Self-Feeding Abilities: Able to feed self Patient Positioning: Upright in bed Baseline Vocal Quality: Clear Volitional Cough: Strong Volitional Swallow: Able to elicit    Oral/Motor/Sensory Function Overall Oral Motor/Sensory Function: Appears within functional limits for tasks assessed   Ice Chips Ice chips: Within functional limits Presentation: Self Fed   Thin Liquid Thin Liquid: Within functional limits Presentation: Cup    Nectar Thick Nectar Thick Liquid: Not tested   Honey Thick Honey Thick Liquid: Not tested   Puree Puree: Within functional limits  Presentation: [place], [place] CCC/SLP Pager [phone]     Solid: Within functional limits Presentation: Self Fed       Rebecca Skinner 08/28/2013,2:29 PM

## 2013-08-28 NOTE — Evaluation (Signed)
Speech Language Pathology Evaluation Patient Details Name: Rebecca Skinner MRN: [id number] DOB: Feb 03, 1923 Today's Date: 08/28/2013 Time: 0086-7619 SLP Time Calculation (min): 15 min  Problem List:  Patient Active Problem List   Diagnosis Date Noted  . Stroke 08/27/2013  . Dyspnea 08/07/2013  . Annual physical exam 11/21/2011  . Anxiety 11/20/2011  . Headache 02/18/2011  . Dizziness   . GOITER, MULTINODULAR 08/03/2010  . ANEMIA 07/17/2010  . KELOID 07/17/2010  . GAIT DISTURBANCE 04/19/2010  . DYSPEPSIA 08/29/2009  . OSTEOPENIA 10/31/2008  . OSTEOARTHRITIS 06/08/2008  . UTI'S, RECURRENT 07/20/2007  . ATRIAL FIBRILLATION, PAROXYSMAL 03/05/2007  . DIABETES MELLITUS, TYPE II 11/26/2006  . COLONOSCOPY, HX OF 10/15/2006  . AORTIC VALVE REPLACEMENT, HX OF 10/15/2006  . HYPERTHYROIDISM 09/03/2006  . HYPERLIPIDEMIA 09/03/2006  . HYPERTENSION 09/03/2006  . CAD 09/03/2006  . ASTHMA, INTRINSIC NOS 09/03/2006  . CORONARY ARTERY BYPASS GRAFT, HX OF 09/03/2006   Past Medical History:  Past Medical History  Diagnosis Date  . Asthma   . Diabetes mellitus   . Hypertension   . Hyperlipidemia   . CAD (coronary artery disease)     s/p CABG s/p AO valve replacement ([place] [facility])  . Recurrent UTI   . Paroxysmal atrial fibrillation     cards d/c coumadin 12/2008 d/t persisten NSR and frequent falls- restarted coumadin july 2011  . Hyperthyroidism   . Dry skin   . Osteoarthritis   . Osteopenia     per dexa 12/09  . Keloid     @ chest  . Dizziness     Chronic, admiet 07-2010,saw neuro, thought to be a peripheral issue   . Anxiety 11/20/2011   Past Surgical History:  Past Surgical History  Procedure Laterality Date  . Abdominal hysterectomy    . Total knee arthroplasty  1999  . Oophorectomy    . Cagb  1998  . Cesarean section      x 2  . Hemorrhoid surgery     HPI:   80 y.o. female with left gaze preference and aphasia sudden onset sometime after 9:30am 08/27/13. She was within  the window for IV tPA and has received this.  MRI: IMPRESSION: 1. Acute ischemic infarct involving the left anterior frontal lobe, left operculum, and left insular cortex. No evidence of hemorrhagic conversion status post tPA. 2. Remote right pontine lacunar infarct. 3. Advanced age-related atrophy with chronic microvascular ischemic disease.   Assessment / Plan / Recommendation Clinical Impression  Pt presents with a moderate expressive/receptive aphasia, improved already from this morning.  Able to communicate in sentences with no dysarthria/apraxia.  Comprehension is marked by good reliability for basic/biographical yes/no questions, but impaired understanding of novel, mildly complex questions.  She can follow basic commands, but has difficulty with multistep instructions.  Repetition of single words is intact; more difficulty repeating phonetically-complex sentences.  Word-retrieval deficits apparent during expression, with prevalence of perseverative output and occasional semantic paraphasias.  Pt demonstrating awareness of communication errors but has difficulty with self-correction.  Recommend aphasia therapy while in acute care; will follow for improvements - will likely need f/u in next venue of care.  May benefit from CIR depending on OT and PT assessments.      SLP Assessment  Patient needs continued Speech Lanaguage Pathology Services    Follow Up Recommendations  None    Frequency and Duration min 2x/week  2 weeks      SLP Goals  SLP Goals Potential to Achieve Goals: Good  SLP  Evaluation Prior Functioning  Cognitive/Linguistic Baseline: Information not available   Cognition  Overall Cognitive Status: Impaired/Different from baseline Orientation Level: Disoriented to person;Disoriented to place;Disoriented to situation;Oriented to time Attention: Selective Selective Attention: Impaired Selective Attention Impairment: Verbal basic    Comprehension  Auditory  Comprehension Overall Auditory Comprehension: Impaired Yes/No Questions: Impaired Complex Questions: 50-74% accurate Commands: Impaired Two Step Basic Commands: 50-74% accurate Conversation: Simple EffectiveTechniques: Extra processing time;Pausing Visual Recognition/Discrimination Discrimination: Exceptions to [employer] Common Objects: Able in field of 2 Black/White Line Drawings: Other (comment) (70% accuracy) Reading Comprehension Reading Status: Impaired Word level: Impaired Sentence Level: Impaired Paragraph Level: Impaired Functional Environmental (signs, name badge): Impaired    Expression Expression Primary Mode of Expression: Verbal Verbal Expression Overall Verbal Expression: Impaired Initiation: No impairment Automatic Speech: Name;Social Response;Counting Level of Generative/Spontaneous Verbalization: Sentence Repetition: Impaired Level of Impairment: Sentence level Naming: Impairment Responsive: 51-75% accurate Confrontation: Impaired Common Objects: Able in field of 2 Black/White Line Drawings: Other (comment) (70% accuracy) Convergent: 50-74% accurate Divergent: Not tested Verbal Errors: Semantic paraphasias;Perseveration Pragmatics: No impairment Written Expression Written Expression: Not tested   Oral / Motor Oral Motor/Sensory Function Overall Oral Motor/Sensory Function: Appears within functional limits for tasks assessed Motor Speech Overall Motor Speech: Appears within functional limits for tasks assessed   Danyon Mcginness L. Tivis Ringer, [place] CCC/SLP Pager [phone]      Juan Quam Laurice 08/28/2013, 2:43 PM

## 2013-08-28 NOTE — Progress Notes (Signed)
Stroke Team Progress Note  HISTORY  80 y.o. female who was last seen normal at her baseline at 9:30 AM. later the nephew came home and found patient not responding appropriately, moaning, but did not note any Jerking motion. Patient was brought to [facility] hospital as a code stroke. On arrival it was noted patient would not follow commands, moaning, left gaze deviation. There was concern for both stroke versus status epilepticus. Initial CT head showed no acute bleed. It was discussed with patients family about need for tPA and family was in agreement. In addition EEG was called to obtain a stat EEG. Patient was also brought to CT a second time to evaluate for large vessel occlusion.    SUBJECTIVE MIR multiple L sided strokes. Was not on coumadin, taken off for unknown reason.  Exam improved.   OBJECTIVE Most recent Vital Signs: Filed Vitals:   08/28/13 0800 08/28/13 0814 08/28/13 0900 08/28/13 0944  BP: 149/70  156/126   Pulse: 78  85   Temp:  98 F (36.7 C)    TempSrc:  Oral    Resp: 18  15   Height:      Weight: 79.2 kg (174 lb 9.7 oz)     SpO2: 97%  97% 97%   CBG (last 3)   Recent Labs  08/27/13 1211 08/27/13 1547  GLUCAP 105* 120*    IV Fluid Intake:   . sodium chloride 75 mL/hr at 08/28/13 0051    MEDICATIONS  . fluticasone  1 puff Inhalation BID  . LORazepam  1 mg Intravenous STAT  . methimazole  5 mg Oral Q M,W,F  . pantoprazole (PROTONIX) IV  40 mg Intravenous QHS   PRN:  acetaminophen, acetaminophen, labetalol  Diet:  NPO  Activity:  Bedrest DVT Prophylaxis:  SCDs CLINICALLY SIGNIFICANT STUDIES Basic Metabolic Panel:  Recent Labs Lab 08/27/13 1204 08/27/13 1214  NA 142 143  K 4.5 4.2  CL 100 101  CO2 27  --   GLUCOSE 107* 108*  BUN 16 17  CREATININE 1.01 1.20*  CALCIUM 9.5  --    Liver Function Tests:  Recent Labs Lab 08/27/13 1204  AST 25  ALT 19  ALKPHOS 97  BILITOT 0.5  PROT 7.6  ALBUMIN 3.7   CBC:  Recent Labs Lab 08/27/13 1204  08/27/13 1214  WBC 6.2  --   NEUTROABS 3.4  --   HGB 15.1* 16.7*  HCT 45.8 49.0*  MCV 98.5  --   PLT 210  --    Coagulation:  Recent Labs Lab 08/27/13 1204  LABPROT 15.3*  INR 1.24   Cardiac Enzymes: No results found for this basename: CKTOTAL, CKMB, CKMBINDEX, TROPONINI,  in the last 168 hours Urinalysis:  Recent Labs Lab 08/27/13 1432  COLORURINE YELLOW  LABSPEC 1.038*  PHURINE 6.5  GLUCOSEU NEGATIVE  HGBUR NEGATIVE  BILIRUBINUR NEGATIVE  KETONESUR NEGATIVE  PROTEINUR NEGATIVE  UROBILINOGEN 0.2  NITRITE NEGATIVE  LEUKOCYTESUR SMALL*   Lipid Panel    Component Value Date/Time   CHOL 106 08/12/2013 0840   TRIG 62 08/12/2013 0840   HDL 36* 08/12/2013 0840   CHOLHDL 2.9 08/12/2013 0840   VLDL 12 08/12/2013 0840   LDLCALC 58 08/12/2013 0840   HgbA1C  Lab Results  Component Value Date   HGBA1C 6.5 07/13/2013    Urine Drug Screen:     Component Value Date/Time   LABOPIA NONE DETECTED 08/27/2013 1432   COCAINSCRNUR NONE DETECTED 08/27/2013 1432   LABBENZ NONE DETECTED 08/27/2013  [place] 08/27/2013 1432   THCU NONE DETECTED 08/27/2013 1432   LABBARB NONE DETECTED 08/27/2013 1432    Alcohol Level:  Recent Labs Lab 08/27/13 1204  ETH <11    Ct Angio Head W/cm &/or Wo Cm  08/27/2013   CLINICAL DATA:  80 year old female code stroke. Symptoms suggesting small left hemisphere cortical infarct, symptoms improving status post tPA. Initial encounter.  EXAM: CT ANGIOGRAPHY HEAD AND NECK  TECHNIQUE: Multidetector CT imaging of the head and neck was performed using the standard protocol during bolus administration of intravenous contrast. Multiplanar CT image reconstructions and MIPs were obtained to evaluate the vascular anatomy. Carotid stenosis measurements (when applicable) are obtained utilizing NASCET criteria, using the distal internal carotid diameter as the denominator.  CONTRAST:  52mL OMNIPAQUE IOHEXOL 350 MG/ML SOLN  COMPARISON:  Non contrast head CT 1157  hr the same day. Neck and head MRA 06/28/2010.  FINDINGS: CTA HEAD FINDINGS  No abnormal enhancement identified. No midline shift, mass effect, or evidence of intracranial mass lesion. No ventriculomegaly. Stable bilateral cerebral white matter hypodensity. No evidence of cortically based acute infarction identified.  VASCULAR FINDINGS:  Major intracranial venous structures are patent. Dominant distal right vertebral artery is patent without stenosis. Both PICA origins are patent. The non dominant left vertebral functionally terminates in PICA. Basilar artery is patent without stenosis. SCA and PCA origins are normal. Normal posterior communicating arteries. Normal bilateral PCA branches.  Patent right ICA siphon with heavy calcification in the cavernous segment. Moderate stenosis at that site (series [id number], image [id number]). Calcified plaque continues into the supra clinoid segment. Right ophthalmic and posterior communicating artery origins are normal. Patent right ICA terminus.  Patent left ICA siphon with less pronounced calcified plaque and stenosis compared to the right side. Normal ophthalmic and posterior communicating artery origins. Patent left ICA terminus.  Left MCA M1 segment is patent and appears normal. Left MCA bifurcation is patent. No left MCA branch occlusion is identified.  ACA and right MCA origins are normal. Anterior communicating artery is normal. Bilateral ACA branches are within normal limits. Right MCA branches are within normal limits.  Review of the MIP images confirms the above findings.  CTA NECK FINDINGS  Negative lung apices. No superior mediastinal lymphadenopathy. Mild generalized thyroid heterogeneity an enlargement compatible with multinodular goiter. Negative larynx, pharynx, parapharyngeal spaces, retropharyngeal space, sublingual space, submandibular glands, and parotid glands. Leftward gaze deviation. Negative orbits soft tissues. Visualized paranasal sinuses and mastoids are  clear. No cervical lymphadenopathy. No acute osseous abnormality identified. Widespread cervical spine degenerative changes, congruent with age.  VASCULAR FINDINGS:  Moderate arch soft and calcified plaque. Four vessel arch configuration with the left vertebral artery arising directly from the arch. No great vessel origin stenosis.  Normal right CCA origin. Mildly tortuous right CCA. Circumferential soft plaque without stenosis. At the right carotid bifurcation there is calcified plaque resulting in less than 50 % stenosis with respect to the distal vessel. Mildly tortuous cervical right ICA otherwise is negative.  No proximal right subclavian artery stenosis. Calcified plaque at the right vertebral artery origin with only mild stenosis. Tortuous proximal right vertebral. The right vertebral is dominant and otherwise negative throughout the neck.  No left CCA stenosis with mild circumferential soft plaque. At the left carotid bifurcation there is soft and calcified plaque resulting in stenosis of up to 50 % with respect to the distal vessel. The on the bulb, the cervical left ICA is tortuous but otherwise normal.  Left vertebral artery origin off the arch is patent but irregular, with up to moderate stenosis. The left vertebral artery is nondominant and remains patent to the skullbase without additional lesions.  Review of the MIP images confirms the above findings.  IMPRESSION: 1. Bilateral carotid bifurcation and carotid siphon atherosclerosis, slightly worse on the right. No left carotid hemodynamically significant stenosis. No left MCA stenosis or major branch occlusion identified. 2. Stable CT appearance of the brain. 3. Dominant right vertebral artery. Origin of the non dominant left vertebral at the aortic arch appears stenotic, but the left vertebral remains patent.  Salient findings on this study reviewed in person with Dr. Mike Craze on 08/27/2013 at 1328 hrs.   Electronically Signed   By: Lars Pinks  M.D.   On: 08/27/2013 13:49   Dg Chest 2 View  08/27/2013   CLINICAL DATA:  Code stroke, weakness, confusion.  EXAM: CHEST  2 VIEW  COMPARISON:  None.  FINDINGS: Prior median sternotomy, CABG and valve replacement. Cardiomegaly. Stable minimal lingular scarring. Lungs otherwise clear. No effusions or acute bony abnormality.  IMPRESSION: No active cardiopulmonary disease.   Electronically Signed   By: Rolm Baptise M.D.   On: 08/27/2013 15:12   Ct Head Wo Contrast  08/27/2013   CLINICAL DATA:  aphasia  EXAM: CT HEAD WITHOUT CONTRAST  TECHNIQUE: Contiguous axial images were obtained from the base of the skull through the vertex without intravenous contrast. Study was performed within 24 hr of patient's arrival at the emergency department.  COMPARISON:  Brain CT June 27, 2010 and brain MRI June 28, 2010  FINDINGS: There is mild diffuse atrophy. There is no mass, hemorrhage, extra-axial fluid collection, or midline shift. There is stable patchy small vessel disease in the centra semiovale bilaterally.  There is a focal area of decreased attenuation in the medial mid right pons concerning for acute infarct. No other findings felt to represent acute infarct present. Both middle cerebral artery show symmetric increased attenuation, a stable finding of questionable significance.  Bony calvarium appears intact.  The mastoid air cells are clear.  IMPRESSION: Suspect recent infarct in the medial right mid pontine region. There is stable atrophy with patchy periventricular small vessel disease. No hemorrhage or mass effect.  Critical Value/emergent results were called by telephone at the time of interpretation on 08/27/2013 at 12:18 PM to Dr. Leonel Ramsay, Neurology , who verbally acknowledged these results.   Electronically Signed   By: Lowella Grip M.D.   On: 08/27/2013 12:23   Ct Angio Neck W/cm &/or Wo/cm  08/27/2013   CLINICAL DATA:  80 year old female code stroke. Symptoms suggesting small left hemisphere  cortical infarct, symptoms improving status post tPA. Initial encounter.  EXAM: CT ANGIOGRAPHY HEAD AND NECK  TECHNIQUE: Multidetector CT imaging of the head and neck was performed using the standard protocol during bolus administration of intravenous contrast. Multiplanar CT image reconstructions and MIPs were obtained to evaluate the vascular anatomy. Carotid stenosis measurements (when applicable) are obtained utilizing NASCET criteria, using the distal internal carotid diameter as the denominator.  CONTRAST:  29mL OMNIPAQUE IOHEXOL 350 MG/ML SOLN  COMPARISON:  Non contrast head CT 1157 hr the same day. Neck and head MRA 06/28/2010.  FINDINGS: CTA HEAD FINDINGS  No abnormal enhancement identified. No midline shift, mass effect, or evidence of intracranial mass lesion. No ventriculomegaly. Stable bilateral cerebral white matter hypodensity. No evidence of cortically based acute infarction identified.  VASCULAR FINDINGS:  Major intracranial venous structures are patent. Dominant distal  right vertebral artery is patent without stenosis. Both PICA origins are patent. The non dominant left vertebral functionally terminates in PICA. Basilar artery is patent without stenosis. SCA and PCA origins are normal. Normal posterior communicating arteries. Normal bilateral PCA branches.  Patent right ICA siphon with heavy calcification in the cavernous segment. Moderate stenosis at that site (series [id number], image 193). Calcified plaque continues into the supra clinoid segment. Right ophthalmic and posterior communicating artery origins are normal. Patent right ICA terminus.  Patent left ICA siphon with less pronounced calcified plaque and stenosis compared to the right side. Normal ophthalmic and posterior communicating artery origins. Patent left ICA terminus.  Left MCA M1 segment is patent and appears normal. Left MCA bifurcation is patent. No left MCA branch occlusion is identified.  ACA and right MCA origins are normal.  Anterior communicating artery is normal. Bilateral ACA branches are within normal limits. Right MCA branches are within normal limits.  Review of the MIP images confirms the above findings.  CTA NECK FINDINGS  Negative lung apices. No superior mediastinal lymphadenopathy. Mild generalized thyroid heterogeneity an enlargement compatible with multinodular goiter. Negative larynx, pharynx, parapharyngeal spaces, retropharyngeal space, sublingual space, submandibular glands, and parotid glands. Leftward gaze deviation. Negative orbits soft tissues. Visualized paranasal sinuses and mastoids are clear. No cervical lymphadenopathy. No acute osseous abnormality identified. Widespread cervical spine degenerative changes, congruent with age.  VASCULAR FINDINGS:  Moderate arch soft and calcified plaque. Four vessel arch configuration with the left vertebral artery arising directly from the arch. No great vessel origin stenosis.  Normal right CCA origin. Mildly tortuous right CCA. Circumferential soft plaque without stenosis. At the right carotid bifurcation there is calcified plaque resulting in less than 50 % stenosis with respect to the distal vessel. Mildly tortuous cervical right ICA otherwise is negative.  No proximal right subclavian artery stenosis. Calcified plaque at the right vertebral artery origin with only mild stenosis. Tortuous proximal right vertebral. The right vertebral is dominant and otherwise negative throughout the neck.  No left CCA stenosis with mild circumferential soft plaque. At the left carotid bifurcation there is soft and calcified plaque resulting in stenosis of up to 50 % with respect to the distal vessel. The on the bulb, the cervical left ICA is tortuous but otherwise normal.  Left vertebral artery origin off the arch is patent but irregular, with up to moderate stenosis. The left vertebral artery is nondominant and remains patent to the skullbase without additional lesions.  Review of the MIP  images confirms the above findings.  IMPRESSION: 1. Bilateral carotid bifurcation and carotid siphon atherosclerosis, slightly worse on the right. No left carotid hemodynamically significant stenosis. No left MCA stenosis or major branch occlusion identified. 2. Stable CT appearance of the brain. 3. Dominant right vertebral artery. Origin of the non dominant left vertebral at the aortic arch appears stenotic, but the left vertebral remains patent.  Salient findings on this study reviewed in person with Dr. Mike Craze on 08/27/2013 at 1328 hrs.   Electronically Signed   By: Lars Pinks M.D.   On: 08/27/2013 13:49   Mr Brain Wo Contrast  08/27/2013   CLINICAL DATA:  A facial, status post tPA  EXAM: MRI HEAD WITHOUT CONTRAST  TECHNIQUE: Multiplanar, multiecho pulse sequences of the brain and surrounding structures were obtained without intravenous contrast.  COMPARISON:  Prior CTA an noncontrast head CT from 08/27/2013, as well as previous MRI from 06/28/2010.  FINDINGS: Diffuse prominence of the CSF containing spaces compatible with  generalized cerebral atrophy. Scattered and confluent T2/FLAIR hyperintensity within the periventricular and deep white matter most compatible with chronic small vessel ischemic changes. Mild hyperintense T2/FLAIR signal also noted within the subcortical white matter of the medial right temporal lobe, and to a lesser extent the left temporal lobe. Remote lacunar infarct noted within the right pons. No mass lesion, midline shift, or extra-axial fluid collection. Ventricles are normal in size without evidence of hydrocephalus.  There is abnormal restricted diffusion involving the left insular cortex and operculum/left anterior frontal lobe, compatible with acute infarct. Corresponding signal dropout seen on ADC map. No evidence of hemorrhagic conversion status post tPA. There is corresponding mild hyperintense T2/FLAIR signal within the involve territory with cortical swelling,  compatible with edema. No other foci of abnormal restricted diffusion seen to suggest acute infarct.  The cervicomedullary junction is normal. Pituitary gland is within normal limits. Pituitary stalk is midline. The globes and optic nerves demonstrate a normal appearance with normal signal intensity. The  The bone marrow signal intensity is normal. Calvarium is intact. Visualized upper cervical spine is within normal limits.  Scalp soft tissues are unremarkable.  Paranasal sinuses are clear.  No mastoid effusion.  IMPRESSION: 1. Acute ischemic infarct involving the left anterior frontal lobe, left operculum, and left insular cortex. No evidence of hemorrhagic conversion status post tPA. 2. Remote right pontine lacunar infarct. 3. Advanced age-related atrophy with chronic microvascular ischemic disease.   Electronically Signed   By: Jeannine Boga M.D.   On: 08/27/2013 22:28    CT of the brain  Suspect recent infarct in the medial right mid pontine region. There  is stable atrophy with patchy periventricular small vessel disease.  No hemorrhage or mass effect.  CTA: Bilateral carotid bifurcation and carotid siphon atherosclerosis,  slightly worse on the right. No left carotid hemodynamically  significant stenosis. No left MCA stenosis or major branch occlusion  identified.    MRI of the brain  Acute ischemic infarct involving the left anterior frontal lobe,  left operculum, and left insular cortex. No evidence of hemorrhagic  conversion status post tPA.  2. Remote right pontine lacunar infarct.   MRA of the brain    2D Echocardiogram    Carotid Doppler    CXR    EKG  A-fib.   Physical exam:  Cerebellar:Mental Status: Alert. Able to follow 3 step commands without difficulty.Memory impairment.  Cranial Nerves: II: Discs flat bilaterally; Visual fields grossly normal, pupils equal, round, reactive to light and accommodation III,IV, VI: L eye deviation, exotropia which is  chronic.  V,VII: smile symmetric, facial light touch sensation normal bilaterally VIII: hearing normal bilaterally IX,X: gag reflex present XI: bilateral shoulder shrug XII: midline tongue extension Motor: Right : Upper extremity   4/5    Left:     Upper extremity   4-/5  Lower extremity   4/5     Lower extremity   4-/5 Tone and bulk:normal tone throughout; no atrophy noted Sensory: Pinprick and light touch intact throughout, bilaterally Deep Tendon Reflexes: 2+ and symmetric throughout Plantars: Right: downgoing   Left: downgoing Cerebellar: normal finger-to-nose, normal rapid alternating movements and normal heel-to-shin test Gait: not accessed.  CV: pulses palpable throughout     ASSESSMENT [name] is a 80 y.o. female ft gaze preference and aphasia sudden onset sometime after 9:30am. She was within the window for IV tPA and has received this. I reviewed the CTA with no intervenable lesion. She will need to be  admitted to the ICU for monitoring. Pt is s/p TPA at 12:30 PM. MRI as above multiple R sided strokes. Family unsure why she was taken off anticoagulation. Gaze deviation and had EEG done, no sharp waves/epileptic activity.      DM  HTN  CAD     Hospital day # 1  TREATMENT/PLAN  If 24 hr CTH is negative for hemorrhagic conversion start pt on ASA 325 as was on 81 at home and dvt prophylaxis  Will need to be anticoagulation which can be started in about 7-10 days  Bedside swallow eval as mental status improved  Echo  Carotids  Pt/ot  Speech  Significant time spent discussing with family goals of care and expressing the current situation.    Elevated creat. From baseline likely hemo concentrated as elevated h/h will increase IVF   Leotis Pain  08/28/2013 9:45 AM  I have personally obtained a history, examined the patient, evaluated imaging results, and formulated the assessment and plan of care. I agree with the above.    To contact  Stroke Continuity provider, please refer to [email or web address] After hours, contact General Neurology

## 2013-08-29 ENCOUNTER — Encounter (HOSPITAL_COMMUNITY): Payer: Self-pay | Admitting: *Deleted

## 2013-08-29 LAB — GLUCOSE, CAPILLARY
GLUCOSE-CAPILLARY: 123 mg/dL — AB (ref 70–99)
GLUCOSE-CAPILLARY: 119 mg/dL — AB (ref 70–99)
Glucose-Capillary: 103 mg/dL — ABNORMAL HIGH (ref 70–99)
Glucose-Capillary: 110 mg/dL — ABNORMAL HIGH (ref 70–99)
Glucose-Capillary: 118 mg/dL — ABNORMAL HIGH (ref 70–99)
Glucose-Capillary: 69 mg/dL — ABNORMAL LOW (ref 70–99)

## 2013-08-29 LAB — HEMOGLOBIN A1C
HEMOGLOBIN A1C: 6.6 % — AB (ref <5.7)
Mean Plasma Glucose: 143 mg/dL — ABNORMAL HIGH (ref <117)

## 2013-08-29 MED ORDER — ATORVASTATIN CALCIUM 40 MG PO TABS
40.0000 mg | ORAL_TABLET | Freq: Every day | ORAL | Status: DC
  Administered 2013-08-29 – 2013-08-30 (×2): 40 mg via ORAL
  Filled 2013-08-29 (×3): qty 1

## 2013-08-29 MED ORDER — GLUCOSE 40 % PO GEL
ORAL | Status: AC
  Filled 2013-08-29: qty 1

## 2013-08-29 MED ORDER — SODIUM CHLORIDE 0.9 % IV SOLN
INTRAVENOUS | Status: DC
  Administered 2013-08-29 – 2013-08-31 (×3): via INTRAVENOUS

## 2013-08-29 MED ORDER — VITAMIN D3 25 MCG (1000 UNIT) PO TABS
1000.0000 [IU] | ORAL_TABLET | Freq: Every day | ORAL | Status: DC
  Administered 2013-08-29 – 2013-08-31 (×3): 1000 [IU] via ORAL
  Filled 2013-08-29 (×3): qty 1

## 2013-08-29 MED ORDER — ALPRAZOLAM 0.5 MG PO TABS: 0.5000 mg | ORAL_TABLET | Freq: Every evening | ORAL | Status: DC | PRN

## 2013-08-29 MED ORDER — PANTOPRAZOLE SODIUM 40 MG PO TBEC
40.0000 mg | DELAYED_RELEASE_TABLET | Freq: Every day | ORAL | Status: DC
  Administered 2013-08-29 – 2013-08-31 (×3): 40 mg via ORAL
  Filled 2013-08-29 (×3): qty 1

## 2013-08-29 MED ORDER — NITROGLYCERIN 0.4 MG SL SUBL: 0.4000 mg | SUBLINGUAL_TABLET | SUBLINGUAL | Status: DC | PRN

## 2013-08-29 MED ORDER — POTASSIUM CHLORIDE 10 MEQ/100ML IV SOLN
10.0000 meq | Freq: Once | INTRAVENOUS | Status: AC
  Administered 2013-08-29: 10 meq via INTRAVENOUS
  Filled 2013-08-29: qty 100

## 2013-08-29 MED ORDER — METOPROLOL TARTRATE 25 MG PO TABS
25.0000 mg | ORAL_TABLET | Freq: Three times a day (TID) | ORAL | Status: DC
  Administered 2013-08-29 – 2013-08-31 (×6): 25 mg via ORAL
  Filled 2013-08-29 (×8): qty 1

## 2013-08-29 MED ORDER — ALBUTEROL SULFATE (2.5 MG/3ML) 0.083% IN NEBU
3.0000 mL | INHALATION_SOLUTION | Freq: Four times a day (QID) | RESPIRATORY_TRACT | Status: DC | PRN
  Administered 2013-08-30 (×2): 3 mL via RESPIRATORY_TRACT
  Filled 2013-08-29 (×2): qty 3

## 2013-08-29 NOTE — Progress Notes (Signed)
Stroke Team Progress Note  HISTORY  80 y.o. female who was last seen normal at her baseline at 9:30 AM. later the nephew came home and found patient not responding appropriately, moaning, but did not note any Jerking motion. Patient was brought to [facility] as a code stroke. On arrival it was noted patient would not follow commands, moaning, left gaze deviation. There was concern for both stroke versus status epilepticus. Initial CT head showed no acute bleed. It was discussed with patients family about need for tPA and family was in agreement. In addition EEG was called to obtain a stat EEG. Patient was also brought to CT a second time to evaluate for large vessel occlusion.    SUBJECTIVE MRI multiple L sided strokes. Was not on coumadin, taken off for unknown reason.  Exam improved. Pt is close to baseline except short term memory deficits. Needs frequent prompting  OBJECTIVE Most recent Vital Signs: Filed Vitals:   08/29/13 0442 08/29/13 0500 08/29/13 0600 08/29/13 0933  BP:  149/73 156/68   Pulse:  83 95   Temp: 97.7 F (36.5 C)     TempSrc: Oral     Resp:  17 18   Height:      Weight:      SpO2:  97% 100% 97%   CBG (last 3)   Recent Labs  08/29/13 0143 08/29/13 0358 08/29/13 0737  GLUCAP 123* 103* 119*    IV Fluid Intake:   . sodium chloride 100 mL/hr (08/28/13 1323)    MEDICATIONS  . aspirin  325 mg Oral Daily  . fluticasone  1 puff Inhalation BID  . heparin subcutaneous  5,000 Units Subcutaneous 3 times per day  . insulin aspart  2-6 Units Subcutaneous 6 times per day  . methimazole  5 mg Oral Q M,W,F  . pantoprazole  40 mg Oral Daily   PRN:  acetaminophen, acetaminophen, labetalol  Diet:  Carb Control  Activity:  Bedrest DVT Prophylaxis:  SCDs CLINICALLY SIGNIFICANT STUDIES Basic Metabolic Panel:   Recent Labs Lab 08/27/13 1204 08/27/13 1214 08/28/13 1824  NA 142 143 137  K 4.5 4.2 3.4*  CL 100 101 101  CO2 27  --  22  GLUCOSE 107* 108* 136*   BUN 16 17 9   CREATININE 1.01 1.20* 0.65  CALCIUM 9.5  --  8.5   Liver Function Tests:   Recent Labs Lab 08/27/13 1204  AST 25  ALT 19  ALKPHOS 97  BILITOT 0.5  PROT 7.6  ALBUMIN 3.7   CBC:   Recent Labs Lab 08/27/13 1204 08/27/13 1214 08/28/13 1824  WBC 6.2  --  6.4  NEUTROABS 3.4  --   --   HGB 15.1* 16.7* 12.4  HCT 45.8 49.0* 37.0  MCV 98.5  --  96.4  PLT 210  --  161   Coagulation:   Recent Labs Lab 08/27/13 1204  LABPROT 15.3*  INR 1.24   Cardiac Enzymes: No results found for this basename: CKTOTAL, CKMB, CKMBINDEX, TROPONINI,  in the last 168 hours Urinalysis:   Recent Labs Lab 08/27/13 1432  COLORURINE YELLOW  LABSPEC 1.038*  PHURINE 6.5  GLUCOSEU NEGATIVE  HGBUR NEGATIVE  BILIRUBINUR NEGATIVE  KETONESUR NEGATIVE  PROTEINUR NEGATIVE  UROBILINOGEN 0.2  NITRITE NEGATIVE  LEUKOCYTESUR SMALL*   Lipid Panel    Component Value Date/Time   CHOL 111 08/28/2013 1824   TRIG 77 08/28/2013 1824   HDL 36* 08/28/2013 1824   CHOLHDL 3.1 08/28/2013 1824   VLDL  15 08/28/2013 1824   LDLCALC 60 08/28/2013 1824   HgbA1C  Lab Results  Component Value Date   HGBA1C 6.6* 08/28/2013    Urine Drug Screen:     Component Value Date/Time   LABOPIA NONE DETECTED 08/27/2013 1432   COCAINSCRNUR NONE DETECTED 08/27/2013 1432   LABBENZ NONE DETECTED 08/27/2013 1432   AMPHETMU NONE DETECTED 08/27/2013 1432   THCU NONE DETECTED 08/27/2013 1432   LABBARB NONE DETECTED 08/27/2013 1432    Alcohol Level:   Recent Labs Lab 08/27/13 1204  ETH <11    Ct Angio Head W/cm &/or Wo Cm  08/27/2013   CLINICAL DATA:  80 year old female code stroke. Symptoms suggesting small left hemisphere cortical infarct, symptoms improving status post tPA. Initial encounter.  EXAM: CT ANGIOGRAPHY HEAD AND NECK  TECHNIQUE: Multidetector CT imaging of the head and neck was performed using the standard protocol during bolus administration of intravenous contrast. Multiplanar CT image  reconstructions and MIPs were obtained to evaluate the vascular anatomy. Carotid stenosis measurements (when applicable) are obtained utilizing NASCET criteria, using the distal internal carotid diameter as the denominator.  CONTRAST:  51mL OMNIPAQUE IOHEXOL 350 MG/ML SOLN  COMPARISON:  Non contrast head CT 1157 hr the same day. Neck and head MRA 06/28/2010.  FINDINGS: CTA HEAD FINDINGS  No abnormal enhancement identified. No midline shift, mass effect, or evidence of intracranial mass lesion. No ventriculomegaly. Stable bilateral cerebral white matter hypodensity. No evidence of cortically based acute infarction identified.  VASCULAR FINDINGS:  Major intracranial venous structures are patent. Dominant distal right vertebral artery is patent without stenosis. Both PICA origins are patent. The non dominant left vertebral functionally terminates in PICA. Basilar artery is patent without stenosis. SCA and PCA origins are normal. Normal posterior communicating arteries. Normal bilateral PCA branches.  Patent right ICA siphon with heavy calcification in the cavernous segment. Moderate stenosis at that site (series [id number], image 193). Calcified plaque continues into the supra clinoid segment. Right ophthalmic and posterior communicating artery origins are normal. Patent right ICA terminus.  Patent left ICA siphon with less pronounced calcified plaque and stenosis compared to the right side. Normal ophthalmic and posterior communicating artery origins. Patent left ICA terminus.  Left MCA M1 segment is patent and appears normal. Left MCA bifurcation is patent. No left MCA branch occlusion is identified.  ACA and right MCA origins are normal. Anterior communicating artery is normal. Bilateral ACA branches are within normal limits. Right MCA branches are within normal limits.  Review of the MIP images confirms the above findings.  CTA NECK FINDINGS  Negative lung apices. No superior mediastinal lymphadenopathy. Mild  generalized thyroid heterogeneity an enlargement compatible with multinodular goiter. Negative larynx, pharynx, parapharyngeal spaces, retropharyngeal space, sublingual space, submandibular glands, and parotid glands. Leftward gaze deviation. Negative orbits soft tissues. Visualized paranasal sinuses and mastoids are clear. No cervical lymphadenopathy. No acute osseous abnormality identified. Widespread cervical spine degenerative changes, congruent with age.  VASCULAR FINDINGS:  Moderate arch soft and calcified plaque. Four vessel arch configuration with the left vertebral artery arising directly from the arch. No great vessel origin stenosis.  Normal right CCA origin. Mildly tortuous right CCA. Circumferential soft plaque without stenosis. At the right carotid bifurcation there is calcified plaque resulting in less than 50 % stenosis with respect to the distal vessel. Mildly tortuous cervical right ICA otherwise is negative.  No proximal right subclavian artery stenosis. Calcified plaque at the right vertebral artery origin with only mild stenosis. Tortuous proximal right vertebral. The  right vertebral is dominant and otherwise negative throughout the neck.  No left CCA stenosis with mild circumferential soft plaque. At the left carotid bifurcation there is soft and calcified plaque resulting in stenosis of up to 50 % with respect to the distal vessel. The on the bulb, the cervical left ICA is tortuous but otherwise normal.  Left vertebral artery origin off the arch is patent but irregular, with up to moderate stenosis. The left vertebral artery is nondominant and remains patent to the skullbase without additional lesions.  Review of the MIP images confirms the above findings.  IMPRESSION: 1. Bilateral carotid bifurcation and carotid siphon atherosclerosis, slightly worse on the right. No left carotid hemodynamically significant stenosis. No left MCA stenosis or major branch occlusion identified. 2. Stable CT  appearance of the brain. 3. Dominant right vertebral artery. Origin of the non dominant left vertebral at the aortic arch appears stenotic, but the left vertebral remains patent.  Salient findings on this study reviewed in person with Dr. Mike Craze on 08/27/2013 at 1328 hrs.   Electronically Signed   By: Lars Pinks M.D.   On: 08/27/2013 13:49   Dg Chest 2 View  08/27/2013   CLINICAL DATA:  Code stroke, weakness, confusion.  EXAM: CHEST  2 VIEW  COMPARISON:  None.  FINDINGS: Prior median sternotomy, CABG and valve replacement. Cardiomegaly. Stable minimal lingular scarring. Lungs otherwise clear. No effusions or acute bony abnormality.  IMPRESSION: No active cardiopulmonary disease.   Electronically Signed   By: Rolm Baptise M.D.   On: 08/27/2013 15:12   Ct Head Wo Contrast  08/28/2013   CLINICAL DATA:  Right hemispheric CVA  EXAM: CT HEAD WITHOUT CONTRAST  TECHNIQUE: Contiguous axial images were obtained from the base of the skull through the vertex without intravenous contrast.  COMPARISON:  MR HEAD W/O CM dated 08/27/2013  FINDINGS: There is no evidence of mass effect, midline shift or extra-axial fluid collections. There is no evidence of a space-occupying lesion or intracranial hemorrhage. There is left frontal lobe gray-white loss consistent with an acute infarction. There is no hemorrhagic transformation. There is an old right lacunar infarct in the right pons. There is periventricular white matter low attenuation likely secondary to microangiopathy.  The ventricles and sulci are appropriate for the patient's age. The basal cisterns are patent.  Visualized portions of the orbits are unremarkable. The visualized portions of the paranasal sinuses and mastoid air cells are unremarkable. Cerebral vascular calcifications are noted.  The osseous structures are unremarkable.  IMPRESSION: Acute nonhemorrhagic left frontal lobe infarction similar when correlated with MRI dated 08/27/2013.   Electronically  Signed   By: Kathreen Devoid   On: 08/28/2013 13:45   Ct Head Wo Contrast  08/27/2013   CLINICAL DATA:  aphasia  EXAM: CT HEAD WITHOUT CONTRAST  TECHNIQUE: Contiguous axial images were obtained from the base of the skull through the vertex without intravenous contrast. Study was performed within 24 hr of patient's arrival at the emergency department.  COMPARISON:  Brain CT June 27, 2010 and brain MRI June 28, 2010  FINDINGS: There is mild diffuse atrophy. There is no mass, hemorrhage, extra-axial fluid collection, or midline shift. There is stable patchy small vessel disease in the centra semiovale bilaterally.  There is a focal area of decreased attenuation in the medial mid right pons concerning for acute infarct. No other findings felt to represent acute infarct present. Both middle cerebral artery show symmetric increased attenuation, a stable finding of questionable significance.  Bony calvarium appears intact.  The mastoid air cells are clear.  IMPRESSION: Suspect recent infarct in the medial right mid pontine region. There is stable atrophy with patchy periventricular small vessel disease. No hemorrhage or mass effect.  Critical Value/emergent results were called by telephone at the time of interpretation on 08/27/2013 at 12:18 PM to Dr. Leonel Ramsay, Neurology , who verbally acknowledged these results.   Electronically Signed   By: Lowella Grip M.D.   On: 08/27/2013 12:23   Ct Angio Neck W/cm &/or Wo/cm  08/27/2013   CLINICAL DATA:  80 year old female code stroke. Symptoms suggesting small left hemisphere cortical infarct, symptoms improving status post tPA. Initial encounter.  EXAM: CT ANGIOGRAPHY HEAD AND NECK  TECHNIQUE: Multidetector CT imaging of the head and neck was performed using the standard protocol during bolus administration of intravenous contrast. Multiplanar CT image reconstructions and MIPs were obtained to evaluate the vascular anatomy. Carotid stenosis measurements (when  applicable) are obtained utilizing NASCET criteria, using the distal internal carotid diameter as the denominator.  CONTRAST:  17mL OMNIPAQUE IOHEXOL 350 MG/ML SOLN  COMPARISON:  Non contrast head CT 1157 hr the same day. Neck and head MRA 06/28/2010.  FINDINGS: CTA HEAD FINDINGS  No abnormal enhancement identified. No midline shift, mass effect, or evidence of intracranial mass lesion. No ventriculomegaly. Stable bilateral cerebral white matter hypodensity. No evidence of cortically based acute infarction identified.  VASCULAR FINDINGS:  Major intracranial venous structures are patent. Dominant distal right vertebral artery is patent without stenosis. Both PICA origins are patent. The non dominant left vertebral functionally terminates in PICA. Basilar artery is patent without stenosis. SCA and PCA origins are normal. Normal posterior communicating arteries. Normal bilateral PCA branches.  Patent right ICA siphon with heavy calcification in the cavernous segment. Moderate stenosis at that site (series [id number], image 193). Calcified plaque continues into the supra clinoid segment. Right ophthalmic and posterior communicating artery origins are normal. Patent right ICA terminus.  Patent left ICA siphon with less pronounced calcified plaque and stenosis compared to the right side. Normal ophthalmic and posterior communicating artery origins. Patent left ICA terminus.  Left MCA M1 segment is patent and appears normal. Left MCA bifurcation is patent. No left MCA branch occlusion is identified.  ACA and right MCA origins are normal. Anterior communicating artery is normal. Bilateral ACA branches are within normal limits. Right MCA branches are within normal limits.  Review of the MIP images confirms the above findings.  CTA NECK FINDINGS  Negative lung apices. No superior mediastinal lymphadenopathy. Mild generalized thyroid heterogeneity an enlargement compatible with multinodular goiter. Negative larynx, pharynx,  parapharyngeal spaces, retropharyngeal space, sublingual space, submandibular glands, and parotid glands. Leftward gaze deviation. Negative orbits soft tissues. Visualized paranasal sinuses and mastoids are clear. No cervical lymphadenopathy. No acute osseous abnormality identified. Widespread cervical spine degenerative changes, congruent with age.  VASCULAR FINDINGS:  Moderate arch soft and calcified plaque. Four vessel arch configuration with the left vertebral artery arising directly from the arch. No great vessel origin stenosis.  Normal right CCA origin. Mildly tortuous right CCA. Circumferential soft plaque without stenosis. At the right carotid bifurcation there is calcified plaque resulting in less than 50 % stenosis with respect to the distal vessel. Mildly tortuous cervical right ICA otherwise is negative.  No proximal right subclavian artery stenosis. Calcified plaque at the right vertebral artery origin with only mild stenosis. Tortuous proximal right vertebral. The right vertebral is dominant and otherwise negative throughout the neck.  No left CCA  stenosis with mild circumferential soft plaque. At the left carotid bifurcation there is soft and calcified plaque resulting in stenosis of up to 50 % with respect to the distal vessel. The on the bulb, the cervical left ICA is tortuous but otherwise normal.  Left vertebral artery origin off the arch is patent but irregular, with up to moderate stenosis. The left vertebral artery is nondominant and remains patent to the skullbase without additional lesions.  Review of the MIP images confirms the above findings.  IMPRESSION: 1. Bilateral carotid bifurcation and carotid siphon atherosclerosis, slightly worse on the right. No left carotid hemodynamically significant stenosis. No left MCA stenosis or major branch occlusion identified. 2. Stable CT appearance of the brain. 3. Dominant right vertebral artery. Origin of the non dominant left vertebral at the aortic  arch appears stenotic, but the left vertebral remains patent.  Salient findings on this study reviewed in person with Dr. Mike Craze on 08/27/2013 at 1328 hrs.   Electronically Signed   By: Lars Pinks M.D.   On: 08/27/2013 13:49   Mr Brain Wo Contrast  08/27/2013   CLINICAL DATA:  A facial, status post tPA  EXAM: MRI HEAD WITHOUT CONTRAST  TECHNIQUE: Multiplanar, multiecho pulse sequences of the brain and surrounding structures were obtained without intravenous contrast.  COMPARISON:  Prior CTA an noncontrast head CT from 08/27/2013, as well as previous MRI from 06/28/2010.  FINDINGS: Diffuse prominence of the CSF containing spaces compatible with generalized cerebral atrophy. Scattered and confluent T2/FLAIR hyperintensity within the periventricular and deep white matter most compatible with chronic small vessel ischemic changes. Mild hyperintense T2/FLAIR signal also noted within the subcortical white matter of the medial right temporal lobe, and to a lesser extent the left temporal lobe. Remote lacunar infarct noted within the right pons. No mass lesion, midline shift, or extra-axial fluid collection. Ventricles are normal in size without evidence of hydrocephalus.  There is abnormal restricted diffusion involving the left insular cortex and operculum/left anterior frontal lobe, compatible with acute infarct. Corresponding signal dropout seen on ADC map. No evidence of hemorrhagic conversion status post tPA. There is corresponding mild hyperintense T2/FLAIR signal within the involve territory with cortical swelling, compatible with edema. No other foci of abnormal restricted diffusion seen to suggest acute infarct.  The cervicomedullary junction is normal. Pituitary gland is within normal limits. Pituitary stalk is midline. The globes and optic nerves demonstrate a normal appearance with normal signal intensity. The  The bone marrow signal intensity is normal. Calvarium is intact. Visualized upper cervical  spine is within normal limits.  Scalp soft tissues are unremarkable.  Paranasal sinuses are clear.  No mastoid effusion.  IMPRESSION: 1. Acute ischemic infarct involving the left anterior frontal lobe, left operculum, and left insular cortex. No evidence of hemorrhagic conversion status post tPA. 2. Remote right pontine lacunar infarct. 3. Advanced age-related atrophy with chronic microvascular ischemic disease.   Electronically Signed   By: Jeannine Boga M.D.   On: 08/27/2013 22:28    CT of the brain  Suspect recent infarct in the medial right mid pontine region. There  is stable atrophy with patchy periventricular small vessel disease.  No hemorrhage or mass effect.  CTA: Bilateral carotid bifurcation and carotid siphon atherosclerosis,  slightly worse on the right. No left carotid hemodynamically  significant stenosis. No left MCA stenosis or major branch occlusion  identified.    MRI of the brain  Acute ischemic infarct involving the left anterior frontal lobe,  left operculum,  and left insular cortex. No evidence of hemorrhagic  conversion status post tPA.  2. Remote right pontine lacunar infarct.   MRA of the brain  Acute ischemic infarct involving the left anterior frontal lobe,  left operculum, and left insular cortex. No evidence of hemorrhagic  conversion status post tPA.   2D Echocardiogram    Carotid Doppler    CXR    EKG  A-fib.   Physical exam:  Cerebellar:Mental Status: Alert. Able to follow 3 step commands without difficulty.Memory impairment.  Cranial Nerves: II: Discs flat bilaterally; Visual fields grossly normal, pupils equal, round, reactive to light and accommodation III,IV, VI: L eye deviation, exotropia which is chronic- but improved today V,VII: smile symmetric, facial light touch sensation normal bilaterally VIII: hearing normal bilaterally IX,X: gag reflex present XI: bilateral shoulder shrug XII: midline tongue extension Motor: Right  : Upper extremity   55    Left:     Upper extremity   5/5  Lower extremity   55     Lower extremity   5/5 Tone and bulk:normal tone throughout; no atrophy noted Sensory: Pinprick and light touch intact throughout, bilaterally Deep Tendon Reflexes: 2+ and symmetric throughout Plantars: Right: downgoing   Left: downgoing Cerebellar: normal finger-to-nose, normal rapid alternating movements and normal heel-to-shin test Gait: not accessed.  CV: pulses palpable throughout     ASSESSMENT [name] is a 80 y.o. female with aphasia sudden onset sometime after 9:30am day of admission. She was within the window for IV tPA and has received this. I reviewed the CTA with no intervenable lesion. She will need to be admitted to the ICU for monitoring. Pt is s/p TPA at 12:30 PM. MRI as above multiple R sided strokes. Family unsure why she was taken off anticoagulation. Gaze deviation and had EEG done, no sharp waves/epileptic activity.  Pt is near baseline today. Drift has resolved and smile symmetrical. Needs frequent prompting as short term memory deficits.     DM  HTN  CAD     Hospital day # 2  TREATMENT/PLAN  ASA 325  Will need to be anticoagulation which can be started in about 7-10 days  Passed bedside swallow eval.   Echo  Carotids  Pt/ot  Transfer out of ICU to floor today  Significant time spent discussing with family goals of care and expressing the current situation.   Elevated creat. From baseline likely hemo concentrated as elevated h/h will increase IVF, much improved down to 0.65 from 1.2   Pt lives with nephew, but daughter states if pt can go home there will be 24 hr arrangement for someone to be in house with her.   Leotis Pain  08/29/2013 10:14 AM  I have personally obtained a history, examined the patient, evaluated imaging results, and formulated the assessment and plan of care. I agree with the above.    To contact Stroke Continuity provider,  please refer to [email or web address] After hours, contact General Neurology

## 2013-08-29 NOTE — Plan of Care (Signed)
Problem: Progression Outcomes Goal: Pain controlled Patient states no pain when assessed.

## 2013-08-29 NOTE — Plan of Care (Signed)
Problem: Progression Outcomes Goal: Communication method established Patient able to communicate verbally, however still confused and residual expressive aphasia.

## 2013-08-29 NOTE — Progress Notes (Signed)
Midnight blood glucose= 69, 220 mL of orange juice given. One hour recheck cbg= [place]

## 2013-08-29 NOTE — Evaluation (Signed)
Physical Therapy Evaluation Patient Details Name: Rebecca Skinner MRN: [id number] DOB: [birth year] Today's Date: 08/29/2013 Time: 9381-8299 PT Time Calculation (min): 26 min  PT Assessment / Plan / Recommendation History of Present Illness  80 y.o. female who was last seen normal at her baseline at 9:30 AM. later the nephew came home and found patient not responding appropriately, moaning, but did not note any Jerking motion. Patient was brought to [facility] hospital as a code stroke. On arrival it was noted patient would not follow commands, moaning, left gaze deviation. There was concern for both stroke versus status epilepticus. Initial CT head showed no acute bleed. It was discussed with patients family about need for tPA and family was in agreement.   MRI revealed several Lt infarcts.  Patient s/p tPA.  Clinical Impression  Patient presents with problems listed below.  Will benefit from acute PT to maximize independence prior to discharge.  Session limited today due to HR increase.  Will address gait at next session as able.    PT Assessment  Patient needs continued PT services    Follow Up Recommendations  CIR;Supervision/Assistance - 24 hour    Does the patient have the potential to tolerate intense rehabilitation      Barriers to Discharge Decreased caregiver support Unsure of family support at home.    Equipment Recommendations  None recommended by PT    Recommendations for Other Services Rehab consult   Frequency Min 3X/week    Precautions / Restrictions Precautions Precautions: Fall Restrictions Weight Bearing Restrictions: No   Pertinent Vitals/Pain       Mobility  Bed Mobility Overal bed mobility: Needs Assistance Bed Mobility: Supine to Sit;Sit to Supine Supine to sit: Supervision Sit to supine: Supervision General bed mobility comments: Verbal cues for bed mobility.  Able to move to sitting with supervision only.  Once sitting, patient with good sitting balance.  Able  to turn head and identify objects to both sides.  Able to rotate trunk/neck to both sides to look at objects behind her with good balance. Transfers Overall transfer level: Needs assistance Equipment used: None Transfers: Sit to/from Stand Sit to Stand: Min guard General transfer comment: Assist for safety/balance during transfers.  Patient able to stand with good balance.  Within 10 seconds, patient with increased HR to 132.  Returned to sitting and supine.  RN notified.    Exercises     PT Diagnosis: Difficulty walking;Altered mental status  PT Problem List: Decreased activity tolerance;Decreased balance;Decreased mobility;Decreased cognition;Decreased knowledge of use of DME;Cardiopulmonary status limiting activity PT Treatment Interventions: DME instruction;Gait training;Functional mobility training;Balance training;Cognitive remediation;Patient/family education     PT Goals(Current goals can be found in the care plan section) Acute Rehab PT Goals Patient Stated Goal: None stated PT Goal Formulation: With patient Time For Goal Achievement: 09/05/13 Potential to Achieve Goals: Good  Visit Information  Last PT Received On: 08/29/13 Assistance Needed: +1 History of Present Illness: 80 y.o. female who was last seen normal at her baseline at 9:30 AM. later the nephew came home and found patient not responding appropriately, moaning, but did not note any Jerking motion. Patient was brought to [facility] hospital as a code stroke. On arrival it was noted patient would not follow commands, moaning, left gaze deviation. There was concern for both stroke versus status epilepticus. Initial CT head showed no acute bleed. It was discussed with patients family about need for tPA and family was in agreement.   MRI revealed several Lt infarcts.  Patient s/p tPA.       Prior [place] expects to be discharged to:: Private residence Living Arrangements: Other relatives ([place]  lives with patient) Available Help at Discharge: Family Type of Home: House Home Access: Stairs to enter Technical brewer of Steps: 1 Entrance Stairs-Rails: None Home Layout: One level Home Equipment: Environmental consultant - 4 wheels;Shower seat;Cane - single point Additional Comments: Information from patient.  Unsure of accuracy given communication deficits Prior Function Level of Independence: Independent with assistive device(s) Comments: Information from patient.  Unsure of accuracy given communication deficits Communication Communication: Expressive difficulties    Cognition  Cognition Arousal/Alertness: Awake/alert (Looking at sales in newspaper as PT entered room) Behavior During Therapy: [facility] for tasks assessed/performed Overall Cognitive Status: Difficult to assess Difficult to assess due to: Impaired communication    Extremity/Trunk Assessment Upper Extremity Assessment Upper Extremity Assessment: Overall WFL for tasks assessed (symmetrical strength) Lower Extremity Assessment Lower Extremity Assessment: Overall WFL for tasks assessed (symmetrical strength) Cervical / Trunk Assessment Cervical / Trunk Assessment: Normal (No gaze preference noted. )   Balance    End of Session PT - End of Session Equipment Utilized During Treatment: Gait belt Activity Tolerance: Patient tolerated treatment well;Treatment limited secondary to medical complications (Comment) (Increased HR) Patient left: in bed;with call bell/phone within reach Nurse Communication: Other (comment) (Increase in HR with mobility)  GP     Rebecca Skinner 08/29/2013, 6:14 PM Rebecca Skinner. Rebecca Skinner, [place] Pager [phone]

## 2013-08-29 NOTE — Plan of Care (Signed)
Problem: Acute Treatment Outcomes Goal: Neuro exam at baseline or improved NIH score improved=3.

## 2013-08-30 LAB — GLUCOSE, CAPILLARY
GLUCOSE-CAPILLARY: 148 mg/dL — AB (ref 70–99)
GLUCOSE-CAPILLARY: 142 mg/dL — AB (ref 70–99)
Glucose-Capillary: 115 mg/dL — ABNORMAL HIGH (ref 70–99)
Glucose-Capillary: 117 mg/dL — ABNORMAL HIGH (ref 70–99)
Glucose-Capillary: 132 mg/dL — ABNORMAL HIGH (ref 70–99)
Glucose-Capillary: 92 mg/dL (ref 70–99)

## 2013-08-30 NOTE — Progress Notes (Signed)
Utilization review completed.  

## 2013-08-30 NOTE — Progress Notes (Signed)
Stroke Team Progress Note  HISTORY 80 y.o. female who was last seen normal at her baseline at 9:30 AM on 08/27/2013. later the nephew came home and found patient not responding appropriately, moaning, but did not note any Jerking motion. Patient was brought to [facility] hospital as a code stroke. On arrival it was noted patient would not follow commands, moaning, left gaze deviation. There was concern for both stroke versus status epilepticus. Initial CT head showed no acute bleed. It was discussed with patients family about need for tPA and family was in agreement. In addition EEG was called to obtain a stat EEG. Patient was also brought to CT a second time to evaluate for large vessel occlusion. CT angio showed no intervening of the lesion. She was admitted to the neuro ICU for further evaluation and treatment.  SUBJECTIVE Her daughter is at the bedside.   OBJECTIVE Most recent Vital Signs: Filed Vitals:   08/29/13 2059 08/29/13 2110 08/30/13 0500 08/30/13 0755  BP: 172/87  156/69 118/82  Pulse: 51 79 54 60  Temp: 98.2 F (36.8 C)  98.5 F (36.9 C) 98.4 F (36.9 C)  TempSrc: Oral  Oral Oral  Resp: 18  18 18   Height:      Weight: 81.5 kg (179 lb 10.8 oz)     SpO2: 99%  100% 100%   CBG (last 3)   Recent Labs  08/30/13 0502 08/30/13 0751 08/30/13 1103  GLUCAP 117* 148* 142*    IV Fluid Intake:   . sodium chloride 50 mL/hr at 08/30/13 0217    MEDICATIONS  . aspirin  325 mg Oral Daily  . atorvastatin  40 mg Oral q1800  . cholecalciferol  1,000 Units Oral Daily  . fluticasone  1 puff Inhalation BID  . heparin subcutaneous  5,000 Units Subcutaneous 3 times per day  . insulin aspart  2-6 Units Subcutaneous 6 times per day  . methimazole  5 mg Oral Q M,W,F  . metoprolol tartrate  25 mg Oral TID  . pantoprazole  40 mg Oral Daily   PRN:  acetaminophen, acetaminophen, albuterol, ALPRAZolam, labetalol, nitroGLYCERIN  Diet:  Carb Control thin liquids Activity:  Up with assistance DVT  Prophylaxis:  Heparin 5000 units sq tid   CLINICALLY SIGNIFICANT STUDIES Basic Metabolic Panel:   Recent Labs Lab 08/27/13 1204 08/27/13 1214 08/28/13 1824  NA 142 143 137  K 4.5 4.2 3.4*  CL 100 101 101  CO2 27  --  22  GLUCOSE 107* 108* 136*  BUN 16 17 9   CREATININE 1.01 1.20* 0.65  CALCIUM 9.5  --  8.5   Liver Function Tests:   Recent Labs Lab 08/27/13 1204  AST 25  ALT 19  ALKPHOS 97  BILITOT 0.5  PROT 7.6  ALBUMIN 3.7   CBC:   Recent Labs Lab 08/27/13 1204 08/27/13 1214 08/28/13 1824  WBC 6.2  --  6.4  NEUTROABS 3.4  --   --   HGB 15.1* 16.7* 12.4  HCT 45.8 49.0* 37.0  MCV 98.5  --  96.4  PLT 210  --  161   Coagulation:   Recent Labs Lab 08/27/13 1204  LABPROT 15.3*  INR 1.24   Cardiac Enzymes: No results found for this basename: CKTOTAL, CKMB, CKMBINDEX, TROPONINI,  in the last 168 hours Urinalysis:   Recent Labs Lab 08/27/13 1432  COLORURINE YELLOW  LABSPEC 1.038*  PHURINE 6.5  GLUCOSEU NEGATIVE  HGBUR NEGATIVE  BILIRUBINUR NEGATIVE  KETONESUR NEGATIVE  PROTEINUR NEGATIVE  UROBILINOGEN 0.2  NITRITE NEGATIVE  LEUKOCYTESUR SMALL*   Lipid Panel    Component Value Date/Time   CHOL 111 08/28/2013 1824   TRIG 77 08/28/2013 1824   HDL 36* 08/28/2013 1824   CHOLHDL 3.1 08/28/2013 1824   VLDL 15 08/28/2013 1824   LDLCALC 60 08/28/2013 1824   HgbA1C  Lab Results  Component Value Date   HGBA1C 6.6* 08/28/2013    Urine Drug Screen:     Component Value Date/Time   LABOPIA NONE DETECTED 08/27/2013 1432   COCAINSCRNUR NONE DETECTED 08/27/2013 1432   LABBENZ NONE DETECTED 08/27/2013 1432   AMPHETMU NONE DETECTED 08/27/2013 1432   THCU NONE DETECTED 08/27/2013 1432   LABBARB NONE DETECTED 08/27/2013 1432    Alcohol Level:   Recent Labs Lab 08/27/13 1204  ETH <11    No results found.  CT of the brain  Suspect recent infarct in the medial right mid pontine region. There  is stable atrophy with patchy periventricular small vessel  disease. No hemorrhage or mass effect.  CTA: Bilateral carotid bifurcation and carotid siphon atherosclerosis,  slightly worse on the right. No left carotid hemodynamically  Significant stenosis. No left MCA stenosis or major branch occlusion identified.  MRI of the brain  Acute ischemic infarct involving the left anterior frontal lobe,  left operculum, and left insular cortex. No evidence of hemorrhagic conversion status post tPA.  2. Remote right pontine lacunar infarct.  MRA of the brain  Acute ischemic infarct involving the left anterior frontal lobe, left operculum, and left insular cortex. No evidence of hemorrhagic conversion status post tPA.   2D Echocardiogram  08/10/2013 EF 50-55% with no source of embolus.   Carotid Doppler  No evidence of hemodynamically significant internal carotid artery stenosis. Vertebral artery flow is antegrade.   CXR  08/27/2013 No active cardiopulmonary disease.   EKG  A-fib.   EEG no sharp waves/epileptic activity  Physical exam: Cerebellar:Mental Status: Alert. Able to follow 3 step commands without difficulty.Memory impairment.  Cranial Nerves: II: Discs flat bilaterally; Visual fields grossly normal, pupils equal, round, reactive to light and accommodation III,IV, VI: L eye deviation, exotropia which is chronic- but improved today V,VII: smile symmetric, facial light touch sensation normal bilaterally VIII: hearing normal bilaterally IX,X: gag reflex present XI: bilateral shoulder shrug XII: midline tongue extension Motor: Right : Upper extremity   55    Left:     Upper extremity   5/5  Lower extremity   55     Lower extremity   5/5 Tone and bulk:normal tone throughout; no atrophy noted Sensory: Pinprick and light touch intact throughout, bilaterally Deep Tendon Reflexes: 2+ and symmetric throughout Plantars: Right: downgoing   Left: downgoing Cerebellar: normal finger-to-nose, normal rapid alternating movements and normal heel-to-shin  test Gait: not accessed.  CV: pulses palpable throughout   ASSESSMENT [name] is a 80 y.o. female with aphasia sudden onset. She received IV tPA 08/27/2013 at 1230. CTA with no intervenable lesion. Imaging confirmed multiple left brain infarct in the setting of an old right infarct. Infarcts have to be embolic secondary to known atrial fibrillation. She has been on anticoagulation in the past, but family unsure why she was taken off anticoagulation. She was on aspirin 81 mg and Plavix 75 mg daily prior to admission. She is now on aspirin 325 mg daily.   atrial fibrillation   Hypertension Hyperlipidemia, LDL 60, on Lipitor 40 mg PTA, now on Lipitor 40 mg daily, at goal LDL <  41 for diabetics  Diabetes, HgbA1c 6.6, goal < 7.0  CAD  Baseline memory deficit  Hospital day # 3  TREATMENT/PLAN  Continue ASA 325 milligrams daily for now. Plan to resume anticoagulation with NOAC (Eliquis)   Rehabilitation consult  Dr. [name] discussed diagnosis, prognosis,  treatment options and plan of care with patient and daughter.    Burnetta Sabin, MSN, RN, ANVP-BC, ANP-BC, [facility] Pager: [phone] 08/30/2013 1:26 PM  I have personally obtained a history, examined the patient, evaluated imaging results, and formulated the assessment and plan of care. I agree with the above. Antony Contras, MD   To contact Stroke Continuity provider, please refer to [email or web address] After hours, contact General Neurology

## 2013-08-30 NOTE — Progress Notes (Signed)
Rehab Admissions Coordinator Note:  Patient was screened by Cleatrice Burke for appropriateness for an Inpatient Acute Rehab Consult per P.T. Recommendations. At this time, we are recommending Inpatient Rehab consult. Please order if you feel appropriate.  Cleatrice Burke 08/30/2013, 8:13 AM  I can be reached at [phone].

## 2013-08-30 NOTE — Consult Note (Signed)
Physical Medicine and Rehabilitation Consult Reason for Consult: CVA Referring Physician: Dr. Leonel Ramsay   HPI: Rebecca Skinner is a 80 y.o. right-handed female with history of hypertension, diabetes mellitus and peripheral neuropathy, CAD with CABG and PAF with Coumadin discontinued in July 2010 secondary to frequent falls. Patient independent prior to admission living with her grandson using a cane and walker. Presented 08/27/2013 with altered mental status and left gaze deviation. MRI of the brain showed acute ischemic infarct involving the left anterior frontal lobe, left operculum and left insular cortex as well as remote right pontine lacunar infarct. CTA angiogram head and neck with no stenosis or occlusion. Recent echocardiogram with ejection fraction of 55% with no wall motion abnormalities. Carotid Dopplers with no ICA stenosis. EEG was pending. She did receive TPA. Neurology services followup maintain on aspirin for CVA prophylaxis as well as subcutaneous heparin for DVT prophylaxis. She is tolerating a regular consistency diet. Physical therapy evaluation completed 08/29/2013 with recommendations of physical medicine rehabilitation consult.  Review of Systems  Cardiovascular: Positive for palpitations.  Musculoskeletal: Positive for falls and myalgias.  Neurological: Positive for dizziness.  Psychiatric/Behavioral:       Anxiety  All other systems reviewed and are negative.   Past Medical History  Diagnosis Date  . Asthma   . Diabetes mellitus   . Hypertension   . Hyperlipidemia   . CAD (coronary artery disease)     s/p CABG s/p AO valve replacement ([facility] [facility])  . Recurrent UTI   . Paroxysmal atrial fibrillation     cards d/c coumadin 12/2008 d/t persisten NSR and frequent falls- restarted coumadin july 2011  . Hyperthyroidism   . Dry skin   . Osteoarthritis   . Osteopenia     per dexa 12/09  . Keloid     @ chest  . Dizziness     Chronic, admiet 07-2010,saw  neuro, thought to be a peripheral issue   . Anxiety 11/20/2011   Past Surgical History  Procedure Laterality Date  . Abdominal hysterectomy    . Total knee arthroplasty  1999  . Oophorectomy    . Cagb  1998  . Cesarean section      x 2  . Hemorrhoid surgery     Family History  Problem Relation Age of Onset  . Colon cancer Neg Hx   . Breast cancer Neg Hx   . Thyroid disease Neg Hx   . Coronary artery disease Son 60    deceased  . Heart attack Father   . Hypertension Brother   . Stroke Brother    Social History:  reports that she has quit smoking. She does not have any smokeless tobacco history on file. She reports that she does not drink alcohol or use illicit drugs. Allergies:  Allergies  Allergen Reactions  . Hydrocodone     REACTION: itching/nausea  . Tramadol Hcl     REACTION: itching , upset stomach (08-2009)   Medications Prior to Admission  Medication Sig Dispense Refill  . albuterol (PROVENTIL HFA;VENTOLIN HFA) 108 (90 BASE) MCG/ACT inhaler Inhale 2 puffs into the lungs every 6 (six) hours as needed for wheezing.      Marland Kitchen ALPRAZolam (XANAX) 0.5 MG tablet Take 0.5 mg by mouth at bedtime as needed for anxiety or sleep.      Marland Kitchen amLODipine (NORVASC) 5 MG tablet Take 5 mg by mouth daily.      Marland Kitchen aspirin 81 MG chewable tablet Chew 81  mg by mouth daily.      Marland Kitchen atorvastatin (LIPITOR) 40 MG tablet Take 40 mg by mouth daily at 6 PM.      . beclomethasone (QVAR) 80 MCG/ACT inhaler Inhale 2 puffs into the lungs 2 (two) times daily.      . benazepril (LOTENSIN) 20 MG tablet Take 20 mg by mouth daily.       . Calcium Carbonate (CALCIUM 500 PO) Take 500 mg by mouth daily.      . cholecalciferol (VITAMIN D) 1000 UNITS tablet Take 1,000 Units by mouth daily.        . clopidogrel (PLAVIX) 75 MG tablet Take 1 tablet (75 mg total) by mouth daily.  30 tablet  6  . esomeprazole (NEXIUM) 40 MG capsule Take 40 mg by mouth daily before breakfast.      . furosemide (LASIX) 20 MG tablet Take 20  mg by mouth daily.      . methimazole (TAPAZOLE) 5 MG tablet Take 5 mg by mouth every Monday, Wednesday, and Friday.      . metoprolol tartrate (LOPRESSOR) 25 MG tablet Take 25 mg by mouth 3 (three) times daily.      . nitroGLYCERIN (NITROSTAT) 0.4 MG SL tablet Place 0.4 mg under the tongue every 5 (five) minutes as needed for chest pain.      . sitaGLIPtin (JANUVIA) 50 MG tablet Take 50 mg by mouth daily.      . TRAVATAN Z 0.004 % SOLN ophthalmic solution Place 1 drop into both eyes at bedtime.       . [DISCONTINUED] albuterol (VENTOLIN HFA) 108 (90 BASE) MCG/ACT inhaler Inhale 2 puffs into the lungs every 6 (six) hours as needed for wheezing.  1 Inhaler  3  . [DISCONTINUED] beclomethasone (QVAR) 80 MCG/ACT inhaler Inhale 2 puffs into the lungs 2 (two) times daily.  1 Inhaler  6  . ONE TOUCH ULTRA TEST test strip TEST EVERY DAY AS DIRECTED  100 each  11  . ONETOUCH DELICA LANCETS 96Q MISC TEST EVERY DAY AS DIRECTED  200 each  2    Home: Home Living Family/patient expects to be discharged to:: Private residence Living Arrangements: Other relatives Available Help at Discharge: Family Type of Home: House Home Access: Stairs to enter Technical brewer of Steps: 1 Entrance Stairs-Rails: None Home Layout: One level Home Equipment: Environmental consultant - 4 wheels;Shower seat;Cane - single point Additional Comments: Information from patient.  Unsure of accuracy given communication deficits  Functional History: Prior Function Level of Independence: Independent with assistive device(s) Comments: Information from patient.  Unsure of accuracy given communication deficits Functional Status:  Mobility: Bed Mobility Overal bed mobility: Needs Assistance Bed Mobility: Supine to Sit;Sit to Supine Supine to sit: Supervision Sit to supine: Supervision General bed mobility comments: Verbal cues for bed mobility.  Able to move to sitting with supervision only.  Once sitting, patient with good sitting balance.   Able to turn head and identify objects to both sides.  Able to rotate trunk/neck to both sides to look at objects behind her with good balance. Transfers Overall transfer level: Needs assistance Equipment used: None Transfers: Sit to/from Stand Sit to Stand: Min guard General transfer comment: Assist for safety/balance during transfers.  Patient able to stand with good balance.  Within 10 seconds, patient with increased HR to 132.  Returned to sitting and supine.  RN notified.      ADL:    Cognition: Cognition Overall Cognitive Status: Difficult to assess Orientation Level:  Oriented X4 Attention: Selective Selective Attention: Impaired Selective Attention Impairment: Verbal basic Cognition Arousal/Alertness: Awake/alert Behavior During Therapy: WFL for tasks assessed/performed Overall Cognitive Status: Difficult to assess Difficult to assess due to: Impaired communication  Blood pressure 118/82, pulse 60, temperature 98.4 F (36.9 C), temperature source Oral, resp. rate 18, height 5\' 3"  (1.6 m), weight 81.5 kg (179 lb 10.8 oz), SpO2 100.00%. Physical Exam  Vitals reviewed. Constitutional: She is oriented to person, place, and time.  HENT:  Head: Normocephalic.  Eyes: EOM are normal.  Neck: Normal range of motion. Neck supple. No thyromegaly present.  Cardiovascular:  Cardiac rate controlled  Respiratory: Effort normal and breath sounds normal. No respiratory distress.  GI: Soft. Bowel sounds are normal. She exhibits distension.  Neurological: She is alert and oriented to person, place, and time.  Followed basic commands  Skin: Skin is warm and dry.    Results for orders placed during the hospital encounter of 08/27/13 (from the past 24 hour(s))  GLUCOSE, CAPILLARY     Status: Abnormal   Collection Time    08/29/13  3:38 PM      Result Value Ref Range   Glucose-Capillary 118 (*) 70 - 99 mg/dL  GLUCOSE, CAPILLARY     Status: Abnormal   Collection Time    08/29/13   8:56 PM      Result Value Ref Range   Glucose-Capillary 115 (*) 70 - 99 mg/dL  GLUCOSE, CAPILLARY     Status: Abnormal   Collection Time    08/30/13  5:02 AM      Result Value Ref Range   Glucose-Capillary 117 (*) 70 - 99 mg/dL  GLUCOSE, CAPILLARY     Status: Abnormal   Collection Time    08/30/13  7:51 AM      Result Value Ref Range   Glucose-Capillary 148 (*) 70 - 99 mg/dL   Comment 1 Notify RN     Comment 2 Documented in Chart    GLUCOSE, CAPILLARY     Status: Abnormal   Collection Time    08/30/13 11:03 AM      Result Value Ref Range   Glucose-Capillary 142 (*) 70 - 99 mg/dL   Comment 1 Notify RN     Comment 2 Documented in Chart     No results found.  Assessment/Plan: Diagnosis: embolic left brain cva 1. Does the need for close, 24 hr/day medical supervision in concert with the patient's rehab needs make it unreasonable for this patient to be served in a less intensive setting? No and Potentially 2. Co-Morbidities requiring supervision/potential complications: see above 3. Due to bowel management, safety, skin/wound care and medication administration, does the patient require 24 hr/day rehab nursing? No 4. Does the patient require coordinated care of a physician, rehab nurse, PT, OT to address physical and functional deficits in the context of the above medical diagnosis(es)? No Addressing deficits in the following areas: endurance, strength, transferring, dressing and feeding 5. Can the patient actively participate in an intensive therapy program of at least 3 hrs of therapy per day at least 5 days per week? Potentially 6. The potential for patient to make measurable gains while on inpatient rehab is fair 7. Anticipated functional outcomes upon discharge from inpatient rehab are n/a  with PT, n/a with OT, n/a with SLP. 8. Estimated rehab length of stay to reach the above functional goals is: na 9. Does the patient have adequate social supports to accommodate these discharge  functional goals? Potentially 10.  Anticipated D/C setting: Home 11. Anticipated post D/C treatments: [place] therapy 12. Overall Rehab/Functional Prognosis: excellent  RECOMMENDATIONS: This patient's condition is appropriate for continued rehabilitative care in the following setting: [facility] Therapy Patient has agreed to participate in recommended program. Potentially Note that insurance prior authorization may be required for reimbursement for recommended care.  Comment: ambulating with supervision. Recommend [place], MD, [place] [facility]     08/30/2013

## 2013-08-30 NOTE — Evaluation (Signed)
Occupational Therapy Evaluation Patient Details Name: Rebecca Skinner MRN: [id number] DOB: [birth year] Today's Date: 08/30/2013 Time:  -     OT Assessment / Plan / Recommendation History of present illness 80 y.o. female who was last seen normal at her baseline at 9:30 AM. later the nephew came home and found patient not responding appropriately, moaning, but did not note any Jerking motion. Patient was brought to [facility] hospital as a code stroke. On arrival it was noted patient would not follow commands, moaning, left gaze deviation. There was concern for both stroke versus status epilepticus. Initial CT head showed no acute bleed. It was discussed with patients family about need for tPA and family was in agreement.   MRI revealed several Lt infarcts.  Patient s/p tPA.   Clinical Impression   Pt demos decline in function with ADLs and ADL mobility safety and would benefit from acute OT services to address impairments to increase level of function and safety    OT Assessment  Patient needs continued OT Services    Follow Up Recommendations  Supervision/Assistance - 24 hour    Barriers to Discharge   none  Equipment Recommendations  None recommended by OT    Recommendations for Other Services    Frequency  Min 2X/week    Precautions / Restrictions Precautions Precautions: Fall Restrictions Weight Bearing Restrictions: No   Pertinent Vitals/Pain No c/o pain    ADL  Grooming: Performed;Wash/dry hands;Wash/dry face;Supervision/safety;Min guard Upper Body Bathing: Simulated;Supervision/safety;Min guard Where Assessed - Upper Body Bathing: Unsupported sitting;Unsupported standing Lower Body Bathing: Simulated;Supervision/safety;Min guard Upper Body Dressing: Performed;Supervision/safety;Min guard Where Assessed - Upper Body Dressing: Unsupported sitting;Unsupported standing Lower Body Dressing: Performed;Supervision/safety;Min guard Where Assessed - Lower Body Dressing: Unsupported  sitting;Unsupported standing Toilet Transfer: Performed;Min guard Armed forces technical officer Method: Sit to Loss adjuster, chartered: Regular height toilet;Grab bars Toileting - Clothing Manipulation and Hygiene: Performed;Min guard Where Assessed - Best boy and Hygiene: Standing Tub/Shower Transfer: Simulated;Min guard Warden/ranger: Futures trader Used: Cane Transfers/Ambulation Related to ADLs: Assist for safety/balance during transfers.  Cues for safety ADL Comments: sup/min guard A for safety    OT Diagnosis: Generalized weakness  OT Problem List: Decreased safety awareness;Impaired balance (sitting and/or standing);Decreased cognition OT Treatment Interventions: Self-care/ADL training;Therapeutic activities;Therapeutic exercise;Neuromuscular education;Patient/family education;DME and/or AE instruction;Balance training   OT Goals(Current goals can be found in the care plan section) Acute Rehab OT Goals Patient Stated Goal: to go home OT Goal Formulation: With patient/family Time For Goal Achievement: 08/30/13 Potential to Achieve Goals: Good ADL Goals Pt Will Perform Grooming: with set-up;with supervision;standing Pt Will Perform Upper Body Bathing: with set-up;with supervision;standing Pt Will Perform Lower Body Bathing: with set-up;with supervision;sit to/from stand Pt Will Perform Upper Body Dressing: with supervision;with set-up;standing Pt Will Perform Lower Body Dressing: with supervision;with set-up;sit to/from stand Pt Will Transfer to Toilet: with supervision;with modified independence;ambulating;regular height toilet;grab bars Pt Will Perform Toileting - Clothing Manipulation and hygiene: with supervision;with modified independence;sit to/from stand Pt Will Perform Tub/Shower Transfer: with supervision;with modified independence;shower seat Additional ADL Goal #1: Pt will be able to gather ADL/toiletry items from closet/drawers  with sup in prep for ADLs   Visit Information  Last OT Received On: 08/30/13 Assistance Needed: +1 History of Present Illness: 80 y.o. female who was last seen normal at her baseline at 9:30 AM. later the nephew came home and found patient not responding appropriately, moaning, but did not note any Jerking motion. Patient was brought to [facility] hospital as a code  stroke. On arrival it was noted patient would not follow commands, moaning, left gaze deviation. There was concern for both stroke versus status epilepticus. Initial CT head showed no acute bleed. It was discussed with patients family about need for tPA and family was in agreement.   MRI revealed several Lt infarcts.  Patient s/p tPA.       Prior [place] expects to be discharged to:: Private residence Living Arrangements: Other relatives Available Help at Discharge: Family Type of Home: House Home Access: Stairs to enter Entrance Stairs-Rails: None Home Layout: One level Home Equipment: Environmental consultant - 4 wheels;Shower seat;Cane - single point Prior Function Level of Independence: Independent with assistive device(s) Communication Communication: No difficulties Dominant Hand: Right         Vision/Perception Vision - History Baseline Vision: Wears glasses all the time Patient Visual Report: No change from baseline Perception Perception: Within Functional Limits   Cognition  Cognition Arousal/Alertness: Awake/alert Behavior During Therapy: WFL for tasks assessed/performed Overall Cognitive Status: Impaired/Different from baseline Area of Impairment: Orientation;Memory;Safety/judgement Orientation Level: Disoriented to;Time;Situation Safety/Judgement: Decreased awareness of safety    Extremity/Trunk Assessment Upper Extremity Assessment Upper Extremity Assessment: Generalized weakness Lower Extremity Assessment Lower Extremity Assessment: Defer to PT evaluation Cervical / Trunk  Assessment Cervical / Trunk Assessment: Normal     Mobility Bed Mobility General bed mobility comments: NT, pt up in recliner Transfers Overall transfer level: Needs assistance Equipment used: Straight cane Transfers: Sit to/from Stand Sit to Stand: Min guard General transfer comment: Assist for safety/balance during transfers.  Cues for safety               End of Session OT - End of Session Equipment Utilized During Treatment: Other (comment) (cane) Activity Tolerance: Patient tolerated treatment well Patient left: in chair;with call bell/phone within reach;with family/visitor present;Other (comment) (with PT)  GO     Britt Bottom 08/30/2013, 3:21 PM

## 2013-08-30 NOTE — Progress Notes (Signed)
Spoke with patient and family about signs and symptoms of stroke and causes (including a-fib).  Gave patient educational booklets about stroke care.  Patient and family members verbalized understanding.  Stroke education booklets given to patient who began to read through them.

## 2013-08-30 NOTE — Progress Notes (Signed)
Physical Therapy Treatment Patient Details Name: Rebecca Skinner MRN: [id number] DOB: 06-25-1922 Today's Date: 08/30/2013 Time: 1205-1222 PT Time Calculation (min): 17 min  PT Assessment / Plan / Recommendation  History of Present Illness 80 y.o. female who was last seen normal at her baseline at 9:30 AM. later the nephew came home and found patient not responding appropriately, moaning, but did not note any Jerking motion. Patient was brought to [facility] hospital as a code stroke. On arrival it was noted patient would not follow commands, moaning, left gaze deviation. There was concern for both stroke versus status epilepticus. Initial CT head showed no acute bleed. It was discussed with patients family about need for tPA and family was in agreement.   MRI revealed several Lt infarcts.  Patient s/p tPA.   PT Comments   Patient able to ambulate down hallway with impulsivity noted and decreased judgement increased HR to 142 and audible wheezing despite cues to slow down, to sit for rest.  She demonstrates poor safety awareness, difficulty with head turns during gait and veers to sides or increases speeds when finally complies to move head around.  Feel she is too high level for CIR level therapy, but will benefit from HHPT versus outpatient PT depending on transportation.  Will need capable 24 hour assist at d/c.  Follow Up Recommendations  Home health PT;Supervision/Assistance - 24 hour     Does the patient have the potential to tolerate intense rehabilitation   N/A  Barriers to Discharge  None      Equipment Recommendations  None recommended by PT    Recommendations for Other Services  None  Frequency Min 3X/week   Progress towards PT Goals Progress towards PT goals: Progressing toward goals  Plan Discharge plan needs to be updated    Precautions / Restrictions Precautions Precautions: Fall Restrictions Weight Bearing Restrictions: No   Pertinent Vitals/Pain No pain; HR 142 with ambulation;  down to 112 after few moments seated rest, RN aware    Mobility  Bed Mobility General bed mobility comments: NT, pt up in recliner Transfers Overall transfer level: Needs assistance Equipment used: Straight cane Transfers: Sit to/from Stand Sit to Stand: Min guard General transfer comment: assist for balance with transfers Ambulation/Gait Ambulation/Gait assistance: Min assist;Mod assist Ambulation Distance (Feet): 240 Feet Assistive device: Straight cane Gait Pattern/deviations: Step-through pattern;Trunk flexed;Staggering left;Staggering right General Gait Details: ambulating with cane too tall for her and slightly flexed posture with increased gait speed,  Attempting to engage in head turns during gait with difficulty and pt veering to sides when she finally did turn head.  increased speed with looking to floor. with increased assist for balance Modified Rankin (Stroke Patients Only) Pre-Morbid Rankin Score: No significant disability Modified Rankin: Moderately severe disability      PT Goals (current goals can now be found in the care plan section) Acute Rehab PT Goals Patient Stated Goal: to go home  Visit Information  Last PT Received On: 08/30/13 Assistance Needed: +1 History of Present Illness: 80 y.o. female who was last seen normal at her baseline at 9:30 AM. later the nephew came home and found patient not responding appropriately, moaning, but did not note any Jerking motion. Patient was brought to [facility] hospital as a code stroke. On arrival it was noted patient would not follow commands, moaning, left gaze deviation. There was concern for both stroke versus status epilepticus. Initial CT head showed no acute bleed. It was discussed with patients family about need for  tPA and family was in agreement.   MRI revealed several Lt infarcts.  Patient s/p tPA.    Subjective Data  Patient Stated Goal: to go home   Cognition  Cognition Arousal/Alertness: Awake/alert Behavior  During Therapy: WFL for tasks assessed/performed Overall Cognitive Status: Impaired/Different from baseline Area of Impairment: Orientation;Memory;Safety/judgement Orientation Level: Disoriented to;Time;Situation Safety/Judgement: Decreased awareness of safety    Balance  Balance Overall balance assessment: Needs assistance Standing balance support: No upper extremity supported Standing balance-Leahy Scale: Fair Standing balance comment: stands wide base of support no UE assist supevision; limited safety and independence during function; high risk for falls  End of Session PT - End of Session Equipment Utilized During Treatment: Gait belt Activity Tolerance: Treatment limited secondary to medical complications (Comment) (HR up to 142 and wheezing noted; RN paged for inhaler) Patient left: in chair;with call bell/phone within reach;with family/visitor present;with nursing/sitter in room   GP     [facility] 08/30/2013, 4:38 PM Magda Kiel, [place] 08/30/2013

## 2013-08-31 DIAGNOSIS — I6350 Cerebral infarction due to unspecified occlusion or stenosis of unspecified cerebral artery: Secondary | ICD-10-CM

## 2013-08-31 LAB — GLUCOSE, CAPILLARY
GLUCOSE-CAPILLARY: 108 mg/dL — AB (ref 70–99)
GLUCOSE-CAPILLARY: 114 mg/dL — AB (ref 70–99)
Glucose-Capillary: 106 mg/dL — ABNORMAL HIGH (ref 70–99)
Glucose-Capillary: 114 mg/dL — ABNORMAL HIGH (ref 70–99)

## 2013-08-31 MED ORDER — APIXABAN 5 MG PO TABS: 5.0000 mg | ORAL_TABLET | Freq: Two times a day (BID) | ORAL | Status: DC

## 2013-08-31 MED ORDER — APIXABAN 5 MG PO TABS
5.0000 mg | ORAL_TABLET | Freq: Two times a day (BID) | ORAL | Status: DC
  Administered 2013-08-31: 5 mg via ORAL
  Filled 2013-08-31 (×2): qty 1

## 2013-08-31 NOTE — Progress Notes (Signed)
Stroke Team Progress Note  HISTORY 80 y.o. female who was last seen normal at her baseline at 9:30 AM on 08/27/2013. later the nephew came home and found patient not responding appropriately, moaning, but did not note any Jerking motion. Patient was brought to [facility] hospital as a code stroke. On arrival it was noted patient would not follow commands, moaning, left gaze deviation. There was concern for both stroke versus status epilepticus. Initial CT head showed no acute bleed. It was discussed with patients family about need for tPA and family was in agreement. In addition EEG was called to obtain a stat EEG. Patient was also brought to CT a second time to evaluate for large vessel occlusion. CT angio showed no intervening of the lesion. She was admitted to the neuro ICU for further evaluation and treatment.  SUBJECTIVE Daughter at bedside. They are concerned with taking her home due to memory deficits and she is alone at night. They do wish for full home health therapy and support.  OBJECTIVE Most recent Vital Signs: Filed Vitals:   08/30/13 1815 08/30/13 2011 08/31/13 0514 08/31/13 0815  BP: 135/76 152/68 173/89 175/92  Pulse: 72 82 81 86  Temp: 98.5 F (36.9 C) 98.3 F (36.8 C) 98.8 F (37.1 C) 98.1 F (36.7 C)  TempSrc: Oral Oral Oral Oral  Resp: 18 20 18 19   Height:      Weight:      SpO2: 100% 98% 97% 97%   CBG (last 3)   Recent Labs  08/31/13 0125 08/31/13 0511 08/31/13 0807  GLUCAP 106* 108* 114*    IV Fluid Intake:   . sodium chloride 50 mL/hr at 08/31/13 0717    MEDICATIONS  . aspirin  325 mg Oral Daily  . atorvastatin  40 mg Oral q1800  . cholecalciferol  1,000 Units Oral Daily  . fluticasone  1 puff Inhalation BID  . heparin subcutaneous  5,000 Units Subcutaneous 3 times per day  . insulin aspart  2-6 Units Subcutaneous 6 times per day  . methimazole  5 mg Oral Q M,W,F  . metoprolol tartrate  25 mg Oral TID  . pantoprazole  40 mg Oral Daily   PRN:   acetaminophen, acetaminophen, albuterol, ALPRAZolam, labetalol, nitroGLYCERIN  Diet:  Carb Control thin liquids Activity:  Up with assistance DVT Prophylaxis:  Heparin 5000 units sq tid   CLINICALLY SIGNIFICANT STUDIES Basic Metabolic Panel:   Recent Labs Lab 08/27/13 1204 08/27/13 1214 08/28/13 1824  NA 142 143 137  K 4.5 4.2 3.4*  CL 100 101 101  CO2 27  --  22  GLUCOSE 107* 108* 136*  BUN 16 17 9   CREATININE 1.01 1.20* 0.65  CALCIUM 9.5  --  8.5   Liver Function Tests:   Recent Labs Lab 08/27/13 1204  AST 25  ALT 19  ALKPHOS 97  BILITOT 0.5  PROT 7.6  ALBUMIN 3.7   CBC:   Recent Labs Lab 08/27/13 1204 08/27/13 1214 08/28/13 1824  WBC 6.2  --  6.4  NEUTROABS 3.4  --   --   HGB 15.1* 16.7* 12.4  HCT 45.8 49.0* 37.0  MCV 98.5  --  96.4  PLT 210  --  161   Coagulation:   Recent Labs Lab 08/27/13 1204  LABPROT 15.3*  INR 1.24   Cardiac Enzymes: No results found for this basename: CKTOTAL, CKMB, CKMBINDEX, TROPONINI,  in the last 168 hours Urinalysis:   Recent Labs Lab 08/27/13 [place]  LABSPEC 1.038*  PHURINE 6.5  GLUCOSEU NEGATIVE  HGBUR NEGATIVE  BILIRUBINUR NEGATIVE  KETONESUR NEGATIVE  PROTEINUR NEGATIVE  UROBILINOGEN 0.2  NITRITE NEGATIVE  LEUKOCYTESUR SMALL*   Lipid Panel    Component Value Date/Time   CHOL 111 08/28/2013 1824   TRIG 77 08/28/2013 1824   HDL 36* 08/28/2013 1824   CHOLHDL 3.1 08/28/2013 1824   VLDL 15 08/28/2013 1824   LDLCALC 60 08/28/2013 1824   HgbA1C  Lab Results  Component Value Date   HGBA1C 6.6* 08/28/2013    Urine Drug Screen:     Component Value Date/Time   LABOPIA NONE DETECTED 08/27/2013 1432   COCAINSCRNUR NONE DETECTED 08/27/2013 1432   LABBENZ NONE DETECTED 08/27/2013 1432   AMPHETMU NONE DETECTED 08/27/2013 1432   THCU NONE DETECTED 08/27/2013 1432   LABBARB NONE DETECTED 08/27/2013 1432    Alcohol Level:   Recent Labs Lab 08/27/13 1204  ETH <11    No results found.  CT  of the brain  Suspect recent infarct in the medial right mid pontine region. There  is stable atrophy with patchy periventricular small vessel disease. No hemorrhage or mass effect.  CTA: Bilateral carotid bifurcation and carotid siphon atherosclerosis,  slightly worse on the right. No left carotid hemodynamically  Significant stenosis. No left MCA stenosis or major branch occlusion identified.  MRI of the brain  Acute ischemic infarct involving the left anterior frontal lobe,  left operculum, and left insular cortex. No evidence of hemorrhagic conversion status post tPA.  2. Remote right pontine lacunar infarct.  MRA of the brain  Acute ischemic infarct involving the left anterior frontal lobe, left operculum, and left insular cortex. No evidence of hemorrhagic conversion status post tPA.   2D Echocardiogram  08/10/2013 EF 50-55% with no source of embolus.   Carotid Doppler  No evidence of hemodynamically significant internal carotid artery stenosis. Vertebral artery flow is antegrade.   CXR  08/27/2013 No active cardiopulmonary disease.   EKG  A-fib.   EEG no sharp waves/epileptic activity  Physical exam: Cerebellar:Mental Status: Alert. Able to follow 3 step commands without difficulty.Memory impairment.  Cranial Nerves: II: Discs flat bilaterally; Visual fields grossly normal, pupils equal, round, reactive to light and accommodation III,IV, VI: L eye deviation, exotropia which is chronic- but improved today V,VII: smile symmetric, facial light touch sensation normal bilaterally VIII: hearing normal bilaterally IX,X: gag reflex present XI: bilateral shoulder shrug XII: midline tongue extension Motor: Right : Upper extremity   55    Left:     Upper extremity   5/5  Lower extremity   55     Lower extremity   5/5 Tone and bulk:normal tone throughout; no atrophy noted Sensory: Pinprick and light touch intact throughout, bilaterally Deep Tendon Reflexes: 2+ and symmetric  throughout Plantars: Right: downgoing   Left: downgoing Cerebellar: normal finger-to-nose, normal rapid alternating movements and normal heel-to-shin test Gait: not accessed.  CV: pulses palpable throughout   ASSESSMENT Rebecca Skinner is a 80 y.o. female with aphasia sudden onset. She received IV tPA 08/27/2013 at 1230. CTA with no intervenable lesion. Imaging confirmed multiple left brain infarct in the setting of an old right infarct. Infarcts have to be embolic secondary to known atrial fibrillation. She has been on anticoagulation in the past, but family unsure why she was taken off anticoagulation. She was on aspirin 81 mg and Plavix 75 mg daily prior to admission. She is now on aspirin 325 mg daily.   atrial  fibrillation   Hypertension Hyperlipidemia, LDL 60, on Lipitor 40 mg PTA, now on Lipitor 40 mg daily, at goal LDL < 70 for diabetics  Diabetes, HgbA1c 6.6, goal < 7.0  CAD  Baseline memory deficit  Hospital day # 4  TREATMENT/PLAN  Change aspirin to anticoagulation with NOAC (Eliquis)    Too high level for inpatient rehabilitation. We'll do Home health PT, OT, nursing assistant and RN  Discharge home  Dr. [name] discussed diagnosis, prognosis,  treatment options and plan of care with patient and daughter.    Burnetta Sabin, MSN, RN, ANVP-BC, ANP-BC, [facility] Pager: [phone] 08/31/2013 11:25 AM  I have personally obtained a history, examined the patient, evaluated imaging results, and formulated the assessment and plan of care. I agree with the above.  Antony Contras, MD   To contact Stroke Continuity provider, please refer to [email or web address] After hours, contact General Neurologythank you end of methods?

## 2013-08-31 NOTE — Progress Notes (Signed)
   CARE MANAGEMENT NOTE 08/31/2013  Patient:  Rebecca Skinner,Rebecca Skinner   Account Number:  [id number]  Date Initiated:  08/30/2013  Documentation initiated by:  ,  Subjective/Objective Assessment:   admitted with stroke     Action/Plan:   progression of care and discharge planning   Anticipated DC Date:  08/31/2013   Anticipated DC Plan:  HOME W HOME HEALTH SERVICES      DC Planning Services  CM consult      PAC Choice  HOME HEALTH   Choice offered to / List presented to:  C-1 Patient        HH arranged  HH-1 RN  HH-2 PT  HH-3 OT      HH agency  [facility]   Status of service:  Completed, signed off Medicare Important Message given?   (If response is "NO", the following Medicare IM given date fields will be blank) Date Medicare IM given:   Date Additional Medicare IM given:    Discharge Disposition:  HOME W HOME HEALTH SERVICES  Per UR Regulation:    If discussed at Long Length of Stay Meetings, dates discussed:    Comments:  08/31/2013  1200   RN, CCM  [phone] CM referral:  home health RN, PT, OT, aide  Met with patient and Rebecca Skinner regarding home health services RN, PT, OT and aide. Patient declined the aide agreed for referral to [facility]. Plan to discharge to Rebecca Skinner's home at [address], [place], [place]  Phone: [phone]   [facility]/ Donna called with referral.  

## 2013-08-31 NOTE — Discharge Summary (Signed)
Stroke Discharge Summary  Patient ID: Rebecca Skinner   MRN: [id number]      DOB: 08-Feb-1923  Date of Admission: 08/27/2013 Date of Discharge: 08/31/2013  Attending Physician:  Suzzanne Cloud, MD, Stroke MD  Consulting Physician(s):      Alger Simons, MD (Physical Medicine & Rehabtilitation)  Patient's PCP:  Kathlene November, MD  Discharge Diagnoses:   multiple left brain infarct in the setting of an old right infarct, s/p IV tpa, felt to be embolic secondary to known atrial fibrillation.   atrial fibrillation   Hypertension  Hyperlipidemia  Diabetes    CAD   Baseline memory deficit  Obesity,  BMI: Body mass index is 31.84 kg/(m^2).  Past Medical History  Diagnosis Date  . Asthma   . Diabetes mellitus   . Hypertension   . Hyperlipidemia   . CAD (coronary artery disease)     s/p CABG s/p AO valve replacement ([facility])  . Recurrent UTI   . Paroxysmal atrial fibrillation     cards d/c coumadin 12/2008 d/t persisten NSR and frequent falls- restarted coumadin july 2011  . Hyperthyroidism   . Dry skin   . Osteoarthritis   . Osteopenia     per dexa 12/09  . Keloid     @ chest  . Dizziness     Chronic, admiet 07-2010,saw neuro, thought to be a peripheral issue   . Anxiety 11/20/2011   Past Surgical History  Procedure Laterality Date  . Abdominal hysterectomy    . Total knee arthroplasty  1999  . Oophorectomy    . Cagb  1998  . Cesarean section      x 2  . Hemorrhoid surgery        Medication List    STOP taking these medications       aspirin 81 MG chewable tablet     clopidogrel 75 MG tablet  Commonly known as:  PLAVIX      TAKE these medications       albuterol 108 (90 BASE) MCG/ACT inhaler  Commonly known as:  PROVENTIL HFA;VENTOLIN HFA  Inhale 2 puffs into the lungs every 6 (six) hours as needed for wheezing.     ALPRAZolam 0.5 MG tablet  Commonly known as:  XANAX  Take 0.5 mg by mouth at bedtime as needed for anxiety or sleep.      amLODipine 5 MG tablet  Commonly known as:  NORVASC  Take 5 mg by mouth daily.     apixaban 5 MG Tabs tablet  Commonly known as:  ELIQUIS  Take 1 tablet (5 mg total) by mouth 2 (two) times daily.     atorvastatin 40 MG tablet  Commonly known as:  LIPITOR  Take 40 mg by mouth daily at 6 PM.     beclomethasone 80 MCG/ACT inhaler  Commonly known as:  QVAR  Inhale 2 puffs into the lungs 2 (two) times daily.     benazepril 20 MG tablet  Commonly known as:  LOTENSIN  Take 20 mg by mouth daily.     CALCIUM 500 PO  Take 500 mg by mouth daily.     cholecalciferol 1000 UNITS tablet  Commonly known as:  VITAMIN D  Take 1,000 Units by mouth daily.     esomeprazole 40 MG capsule  Commonly known as:  NEXIUM  Take 40 mg by mouth daily before breakfast.     furosemide 20 MG tablet  Commonly known as:  LASIX  Take 20 mg by mouth daily.     methimazole 5 MG tablet  Commonly known as:  TAPAZOLE  Take 5 mg by mouth every Monday, Wednesday, and Friday.     metoprolol tartrate 25 MG tablet  Commonly known as:  LOPRESSOR  Take 25 mg by mouth 3 (three) times daily.     nitroGLYCERIN 0.4 MG SL tablet  Commonly known as:  NITROSTAT  Place 0.4 mg under the tongue every 5 (five) minutes as needed for chest pain.     ONE TOUCH ULTRA TEST test strip  Generic drug:  glucose blood  TEST EVERY DAY AS DIRECTED     ONETOUCH DELICA LANCETS 99991111 Misc  TEST EVERY DAY AS DIRECTED     sitaGLIPtin 50 MG tablet  Commonly known as:  JANUVIA  Take 50 mg by mouth daily.     TRAVATAN Z 0.004 % Soln ophthalmic solution  Generic drug:  Travoprost (BAK Free)  Place 1 drop into both eyes at bedtime.        LABORATORY STUDIES CBC    Component Value Date/Time   WBC 6.4 08/28/2013 1824   RBC 3.84* 08/28/2013 1824   HGB 12.4 08/28/2013 1824   HCT 37.0 08/28/2013 1824   PLT 161 08/28/2013 1824   MCV 96.4 08/28/2013 1824   MCH 32.3 08/28/2013 1824   MCHC 33.5 08/28/2013 1824   RDW 13.4 08/28/2013 1824    LYMPHSABS 1.7 08/27/2013 1204   MONOABS 0.9 08/27/2013 1204   EOSABS 0.2 08/27/2013 1204   BASOSABS 0.0 08/27/2013 1204   CMP    Component Value Date/Time   NA 137 08/28/2013 1824   K 3.4* 08/28/2013 1824   CL 101 08/28/2013 1824   CO2 22 08/28/2013 1824   GLUCOSE 136* 08/28/2013 1824   BUN 9 08/28/2013 1824   CREATININE 0.65 08/28/2013 1824   CREATININE 0.98 08/12/2013 0840   CALCIUM 8.5 08/28/2013 1824   PROT 7.6 08/27/2013 1204   ALBUMIN 3.7 08/27/2013 1204   AST 25 08/27/2013 1204   ALT 19 08/27/2013 1204   ALKPHOS 97 08/27/2013 1204   BILITOT 0.5 08/27/2013 1204   GFRNONAA 77* 08/28/2013 1824   GFRAA 89* 08/28/2013 1824   COAGS Lab Results  Component Value Date   INR 1.24 08/27/2013   INR 2.08* 06/30/2010   INR 2.50* 06/29/2010   Lipid Panel    Component Value Date/Time   CHOL 111 08/28/2013 1824   TRIG 77 08/28/2013 1824   HDL 36* 08/28/2013 1824   CHOLHDL 3.1 08/28/2013 1824   VLDL 15 08/28/2013 1824   LDLCALC 60 08/28/2013 1824   HgbA1C  Lab Results  Component Value Date   HGBA1C 6.6* 08/28/2013   Cardiac Panel (last 3 results) No results found for this basename: CKTOTAL, CKMB, TROPONINI, RELINDX,  in the last 72 hours Urinalysis    Component Value Date/Time   COLORURINE YELLOW 08/27/2013 [place] 08/27/2013 1432   LABSPEC 1.038* 08/27/2013 1432   PHURINE 6.5 08/27/2013 1432   GLUCOSEU NEGATIVE 08/27/2013 1432   HGBUR NEGATIVE 08/27/2013 1432   HGBUR negative 12/18/2009 0937   BILIRUBINUR NEGATIVE 08/27/2013 1432   BILIRUBINUR Neg 11/20/2011 1458   KETONESUR NEGATIVE 08/27/2013 1432   PROTEINUR NEGATIVE 08/27/2013 1432   UROBILINOGEN 0.2 08/27/2013 1432   UROBILINOGEN 0.2 11/20/2011 1458   NITRITE NEGATIVE 08/27/2013 1432   NITRITE Neg 11/20/2011 1458   LEUKOCYTESUR SMALL* 08/27/2013 1432   Urine Drug Screen     Component Value  Date/Time   LABOPIA NONE DETECTED 08/27/2013 1432   COCAINSCRNUR NONE DETECTED 08/27/2013 1432   LABBENZ NONE DETECTED 08/27/2013 1432    AMPHETMU NONE DETECTED 08/27/2013 1432   THCU NONE DETECTED 08/27/2013 1432   LABBARB NONE DETECTED 08/27/2013 1432    Alcohol Level    Component Value Date/Time   ETH <11 08/27/2013 1204     SIGNIFICANT DIAGNOSTIC STUDIES CT of the brain Suspect recent infarct in the medial right mid pontine region. There is stable atrophy with patchy periventricular small vessel disease. No hemorrhage or mass effect.  CTA: Bilateral carotid bifurcation and carotid siphon atherosclerosis, slightly worse on the right. No left carotid hemodynamically Significant stenosis. No left MCA stenosis or major branch occlusion identified.  MRI of the brain Acute ischemic infarct involving the left anterior frontal lobe, left operculum, and left insular cortex. No evidence of hemorrhagic conversion status post tPA. 2. Remote right pontine lacunar infarct.  MRA of the brain Acute ischemic infarct involving the left anterior frontal lobe, left operculum, and left insular cortex. No evidence of hemorrhagic conversion status post tPA.  2D Echocardiogram 08/10/2013 EF 50-55% with no source of embolus.  Carotid Doppler No evidence of hemodynamically significant internal carotid artery stenosis. Vertebral artery flow is antegrade.  CXR 08/27/2013 No active cardiopulmonary disease.  EKG A-fib.  EEG no sharp waves/epileptic activity     History of Present Illness  Rebecca Skinner is an 80 y.o. female who was last seen normal at her baseline at 9:30 AM on 08/27/2013. later the nephew came home and found patient not responding appropriately, moaning, but did not note any Jerking motion. Patient was brought to [facility] hospital as a code stroke. On arrival it was noted patient would not follow commands, moaning, left gaze deviation. There was concern for both stroke versus status epilepticus. Initial CT head showed no acute bleed. It was discussed with patients family about need for tPA and family was in agreement. In addition EEG was called to  obtain a stat EEG. Patient was also brought to CT a second time to evaluate for large vessel occlusion. CT angio showed no intervenable lesion. She was admitted to the neuro ICU for further evaluation and treatment.   Hospital Course Patient tolerated tPA without complication. Imaging at 24 hours shows no hemorrhage. Imaging confirmed multiple left brain infarct in the setting of an old right infarct. Infarcts felt to be embolic secondary to known atrial fibrillation. She has been on anticoagulation in the past, but family unsure why she was taken off anticoagulation. She was on aspirin 81 mg and Plavix 75 mg daily prior to admission. She was changed to Eliquis 5 mg bid at time of discharge.  Patient with vascular risk factors of:  atrial fibrillation  Hypertension Hyperlipidemia, LDL 60, on Lipitor 40 mg PTA, now on Lipitor 40 mg daily, at goal LDL < 70 for diabetics Diabetes, HgbA1c 6.6, at goal < 7.0  CAD   Patient also with: Baseline memory deficit  Patient with continued stroke symptoms of impulsivity, decreased judgment, difficulty with ambulation, shortness of breath. Physical therapy, occupational therapy and speech therapy evaluated patient. They felt she was too high level for inpatient rehabilitation. Family's agreement, she was orderedvHome health PT, OT, Psychologist, counselling and RN.   Discharge Exam  Blood pressure 175/92, pulse 86, temperature 98.1 F (36.7 C), temperature source Oral, resp. rate 19, height 5\' 3"  (1.6 m), weight 81.5 kg (179 lb 10.8 oz), SpO2 97.00%.  Cerebellar:Mental Status:  Alert.  Able to follow 3 step commands without difficulty.Memory impairment.  Cranial Nerves:  II: Discs flat bilaterally; Visual fields grossly normal, pupils equal, round, reactive to light and accommodation  III,IV, VI: L eye deviation, exotropia which is chronic- but improved today  V,VII: smile symmetric, facial light touch sensation normal bilaterally  VIII: hearing normal bilaterally   IX,X: gag reflex present  XI: bilateral shoulder shrug  XII: midline tongue extension  Motor:  Right : Upper extremity 55 Left: Upper extremity 5/5  Lower extremity 55 Lower extremity 5/5  Tone and bulk:normal tone throughout; no atrophy noted  Sensory: Pinprick and light touch intact throughout, bilaterally  Deep Tendon Reflexes: 2+ and symmetric throughout  Plantars:  Right: downgoing Left: downgoing  Cerebellar:  normal finger-to-nose, normal rapid alternating movements and normal heel-to-shin test  Gait: not accessed.  CV: pulses palpable throughout    Discharge Diet   Carb Control thin liquids  Discharge Plan    Disposition:  Home with family  Home health PT, OT, RN and NA   Eliquis 5mg  bid for secondary stroke prevention.  Ongoing risk factor control by Primary Care Physician.  Follow-up Kathlene November, MD in 2 weeks.  Follow-up with Dr. Antony Contras, Stroke Clinic in 2 months.  40 minutes were spent preparing discharge.  Signed Burnetta Sabin, MSN, RN, ANVP-BC, AGPCNP-BC St[facility] Center Nurse Practitioner 08/31/2013, 12:05 PM  I have personally examined this patient, reviewed pertinent data and developed the plan of care. I agree with above.  Antony Contras, MD

## 2013-08-31 NOTE — Progress Notes (Signed)
Inpatient Rehabilitation  I note that acute PT and OT are recommending 24 hour supervision/assistance at time of Dc and that PT feels pt. Is too high level for CIR.  Rehab physician recommending home therapies.  WIll sign off.  Please call me if questions.  Thanks!  [facility] Admissions Coordinator Cell [phone] Office [phone]

## 2013-08-31 NOTE — Clinical Documentation Improvement (Signed)
  On admit 3/20 Hgb/Hct 15.1/45.8 and 16.7/49.0. On 3/21 s/p procedure TPA for CVA Hgb/Hct 12.4/37.0, a significant decrease. Please address this drop in values to reflect severity of illness and risk of mortality. Thank you.  Possible Clinical Conditions?   - Expected acute blood loss anemia                                - Other Condition (please specify)                   Thank You, Ezekiel Ina ,RN Clinical Documentation Specialist:  [phone]  [facility] Information Management

## 2013-09-01 ENCOUNTER — Telehealth: Payer: Self-pay | Admitting: Neurology

## 2013-09-01 ENCOUNTER — Telehealth: Payer: Self-pay | Admitting: Internal Medicine

## 2013-09-01 NOTE — Telephone Encounter (Signed)
I have contacted ins requesting a prior auth.  This is pending response.  I did ask that they expedite the request if possible.  As previously noted by Dr Rexene Alberts, this could take a few days.  I called back, got no answer.  Left message indicating all info has been sent to ins, and we are pending a response.

## 2013-09-01 NOTE — Telephone Encounter (Signed)
Page after hours from patient's daughter, [name], regarding not being able to fill the prescription for Eliquis. She stated that it needed prior authorization. I explained to the patient's daughter that sometimes prior authorization does take a few business days. She is advised to check with Dr. [name] whether patient needs to be bridged with aspirin and Plavix or just aspirin in the meantime. I will also check with Dr. [name] regarding prior authorization. Patient apparently got one dose in the morning of her discharge and could not take the evening dose last night. Pramod, please advise.

## 2013-09-01 NOTE — Telephone Encounter (Signed)
Diane with [facility] called.  She states that the prescription Eliquis is at the pharmacy however they need prior authorization for the insurance to pay for it.  Please call either Diane at [phone] or [name] at [phone] to advise.  Patient has been without this medicine.  Mrs. Santillano has missed her night-time dose as well as the dose for this morning.  Please advise.  Thank you

## 2013-09-01 NOTE — Telephone Encounter (Signed)
Pt's daughter calling about prior auth. Please advise

## 2013-09-01 NOTE — Telephone Encounter (Signed)
Pt's daughter is calling concerned about pt's medication. Please advise

## 2013-09-01 NOTE — Telephone Encounter (Signed)
Paperwork for prior authorization faxed to [employer]. Awaiting response. [id number]

## 2013-09-01 NOTE — Telephone Encounter (Signed)
Pt's daughter, [name], called.  She wanted to check the status of the authorization.  I let her know that all the information for the authorization had been submitted and that we are waiting for a response from the insurance company.  She also wanted [place] to know that her mother was without this medicine last night and again this morning.  She asked if her mother should take an aspirin or anything until she is able to get this medicine?  Please call [name] directly at [phone].  Thank you!!

## 2013-09-01 NOTE — Telephone Encounter (Signed)
Patient daughter called and stated that her apixaban (ELIQUIS) 5 MG TABS tablet needs a prior authorization.

## 2013-09-02 ENCOUNTER — Telehealth: Payer: Self-pay | Admitting: *Deleted

## 2013-09-02 NOTE — Telephone Encounter (Signed)
[name] called regarding her mother's medication.  She asked what they should do.  Please call as soon as possible to advise.  Thank you

## 2013-09-02 NOTE — Telephone Encounter (Signed)
I talked to patient's daughter Rebecca Skinner again after hours last night. The prior authorization for Eliquis was denied. Daughter is requesting advise regarding what patient should take. For now, I have advised daughter to put patient back on her aspirin and Plavix that she had been on before the stroke. If she needs alternative treatment, this should be discussed with Dr. [name]. Pramod, please call patient's daughter and advise.

## 2013-09-02 NOTE — Telephone Encounter (Signed)
error 

## 2013-09-02 NOTE — Telephone Encounter (Signed)
I spoke to Dr Rexene Alberts and Christean Grief and explained typically preauthorization for eliquis is obtained in the hospital prior to Dc home .Looks like this was not obtained. I advised to stay on aspirin and plavix for now and call her cardiologist Dr Evette Georges office to get this done tomorrow.

## 2013-09-03 ENCOUNTER — Telehealth: Payer: Self-pay | Admitting: Cardiovascular Disease

## 2013-09-03 NOTE — Telephone Encounter (Signed)
Called [employer] to and spoke with Rosemarie Ax CSR to initiate a PA for patient's Eliquis. Claudia informed me that Dr. Ethel Rana office done a PA 2 days ago and it was denied. They turned it down due to patient having a valve replacement. A-fib and valve replacement was put down on the PA from dr. Ethel Rana office.

## 2013-09-03 NOTE — Telephone Encounter (Signed)
I am not certain who started pt on eliquis. She was on asa/plavix when I had seen her in the offiece. She is a former pt of Dr. Rollene Fare.Was she started on this by neurolgy following her stroke. She previously had not been felt to be a coumadin candidate. If eliquis is needed would use reduced dose with age of 35.

## 2013-09-03 NOTE — Telephone Encounter (Signed)
Forwarded to W. Waddell CMA

## 2013-09-03 NOTE — Telephone Encounter (Signed)
Her insurance company denied her Eliquis. Pharmacist says she need a prior authorization for this medicine.Please [phone].

## 2013-09-03 NOTE — Telephone Encounter (Signed)
Dr. Claiborne Billings we started patient on Eliquis for secondary  Stroke prevention and would appreciate if you would take over management of that

## 2013-09-08 ENCOUNTER — Encounter: Payer: Self-pay | Admitting: Cardiovascular Disease

## 2013-09-08 ENCOUNTER — Ambulatory Visit (INDEPENDENT_AMBULATORY_CARE_PROVIDER_SITE_OTHER): Payer: Medicare Other | Admitting: Cardiovascular Disease

## 2013-09-08 VITALS — BP 120/80 | HR 76 | Ht 62.0 in | Wt 170.5 lb

## 2013-09-08 DIAGNOSIS — E119 Type 2 diabetes mellitus without complications: Secondary | ICD-10-CM

## 2013-09-08 DIAGNOSIS — I2510 Atherosclerotic heart disease of native coronary artery without angina pectoris: Secondary | ICD-10-CM

## 2013-09-08 DIAGNOSIS — R0600 Dyspnea, unspecified: Secondary | ICD-10-CM

## 2013-09-08 DIAGNOSIS — E785 Hyperlipidemia, unspecified: Secondary | ICD-10-CM

## 2013-09-08 DIAGNOSIS — I4891 Unspecified atrial fibrillation: Secondary | ICD-10-CM

## 2013-09-08 DIAGNOSIS — R0609 Other forms of dyspnea: Secondary | ICD-10-CM

## 2013-09-08 DIAGNOSIS — I6350 Cerebral infarction due to unspecified occlusion or stenosis of unspecified cerebral artery: Secondary | ICD-10-CM

## 2013-09-08 DIAGNOSIS — R0989 Other specified symptoms and signs involving the circulatory and respiratory systems: Secondary | ICD-10-CM

## 2013-09-08 DIAGNOSIS — I639 Cerebral infarction, unspecified: Secondary | ICD-10-CM

## 2013-09-08 DIAGNOSIS — I10 Essential (primary) hypertension: Secondary | ICD-10-CM

## 2013-09-08 MED ORDER — APIXABAN 2.5 MG PO TABS: 2.5000 mg | ORAL_TABLET | Freq: Two times a day (BID) | ORAL | Status: DC

## 2013-09-08 MED ORDER — DILTIAZEM HCL ER COATED BEADS 120 MG PO CP24: 120.0000 mg | ORAL_CAPSULE | Freq: Every day | ORAL | Status: DC

## 2013-09-08 MED ORDER — BENAZEPRIL HCL 20 MG PO TABS: 20.0000 mg | ORAL_TABLET | Freq: Every day | ORAL | Status: DC

## 2013-09-08 NOTE — Patient Instructions (Addendum)
Your physician has recommended you make the following change in your medication:   1.) STOP THE AMLODIPINE AND CLOPIDOGREL ( PLAVIX) 2.) START NEW PRESCRIPTION FOR DILTIAZEM 3.) START ELIQUIS 2.5 MG 4.) CONTINUE ASIPRIN These prescriptions has been sent to the pharmacy.  Your physician recommends that you schedule a follow-up appointment in: 3-4 months

## 2013-09-09 ENCOUNTER — Telehealth: Payer: Self-pay | Admitting: Cardiovascular Disease

## 2013-09-09 NOTE — Telephone Encounter (Signed)
Santiago Glad From [facility] has a question about the dosage of Eliquis for Mrs. Rebecca Skinner . Please call [phone]  Thanks

## 2013-09-09 NOTE — Telephone Encounter (Signed)
Santiago Glad @ [facility] called to confirm the strength of patient's Eliquis. The hospital discharge has 5 mg twice daily. I informed per patient was seen here in the office and Dr. Claiborne Billings changed it to 2.5 mg twice a day.

## 2013-09-09 NOTE — Telephone Encounter (Signed)
Message forwarded to Wanda, CMA.  

## 2013-09-10 ENCOUNTER — Encounter: Payer: Self-pay | Admitting: Cardiovascular Disease

## 2013-09-10 NOTE — Progress Notes (Signed)
Patient ID: Rebecca Skinner, female   DOB: 1922-07-22, 80 y.o.   MRN: [id number]      HPI: [name] is an 80 year old female who  established cardiology care with me in February 2015. She is a former patient of Dr. Rollene Skinner.  [name] underwent a pericardial aortic valve replacement in 1998 and single vessel bypass with a vein graft to the right artery artery. She also has a history of sick sinus syndrome with paroxysmal atrial fibrillation and was taken off Coumadin anticoagulation in the past due to multiple falls in 2009 in 2010. There is no history of CVA or TIAs. She does have significant arthritic issues with back discomfort and also arthritis of her knees. She last saw Dr. Rollene Skinner one year ago and at that time, she was in a normal sinus rhythm.  Recently, [name] had noticed increasing palpitations as well as chest fluttering. She has significant shortness of breath almost all the time. She currently is living with her grandson. She does note some wheezing. Her last nuclear perfusion study was in 2011 which was normal. Her last echo Doppler study was in 2013 which showed moderate asymmetric LVH with septal wall at 1.6 cm and posterior wall 1.1 cm of the sigmoid septum. Ejection fraction was greater than 55%. She had mild to moderate mitral regurgitation, mild to moderate tricuspid regurgitation with elevation of RV systolic pressure at 46 mm. There bioprosthetic aortic valve had a peak gradient of 17 and mean gradient of 8 within normal limits for this valve. On 08/12/2013 she underwent a nuclear perfusion study which was interpreted as intermediate risk with it suggested a fixed lateral defect with minimal peri-infarction ischemia. A CardioNet monitor had shown atrial fibrillation with bursts of increased heart rate.  She walks with a walker. She has undergone left knee replacement. She has history of diabetes mellitus as well as hypertension. She had been on dual antiplatelet therapy with  aspirin and Plavix. She was recently hospitalized on on the neurology service from 3/20 - 242015 with CVA. Her symptoms have resolved. Carotid studies demonstrated mild internal carotid stenoses of less than 40%. During that evaluation, she apparently was taken off aspirin and Plavix by Dr. Leonie Skinner and was given a prescription for eloquence 5 mg per tablet upon going home there was some difficulty in her getting the Eliquis due to insurance issues. She presents now for evaluation.  Past Medical History  Diagnosis Date  . Asthma   . Diabetes mellitus   . Hypertension   . Hyperlipidemia   . CAD (coronary artery disease)     s/p CABG s/p AO valve replacement ([facility])  . Recurrent UTI   . Paroxysmal atrial fibrillation     cards d/c coumadin 12/2008 d/t persisten NSR and frequent falls- restarted coumadin july 2011  . Hyperthyroidism   . Dry skin   . Osteoarthritis   . Osteopenia     per dexa 12/09  . Keloid     @ chest  . Dizziness     Chronic, admiet 07-2010,saw neuro, thought to be a peripheral issue   . Anxiety 11/20/2011    Past Surgical History  Procedure Laterality Date  . Abdominal hysterectomy    . Total knee arthroplasty  1999  . Oophorectomy    . Cagb  1998  . Cesarean section      x 2  . Hemorrhoid surgery      Allergies  Allergen Reactions  . Hydrocodone  REACTION: itching/nausea  . Tramadol Hcl     REACTION: itching , upset stomach (08-2009)    Current Outpatient Prescriptions  Medication Sig Dispense Refill  . albuterol (PROVENTIL HFA;VENTOLIN HFA) 108 (90 BASE) MCG/ACT inhaler Inhale 2 puffs into the lungs every 6 (six) hours as needed for wheezing.      Marland Kitchen ALPRAZolam (XANAX) 0.5 MG tablet Take 0.5 mg by mouth at bedtime as needed for anxiety or sleep.      Marland Kitchen aspirin 81 MG tablet Take 81 mg by mouth daily.      Marland Kitchen atorvastatin (LIPITOR) 40 MG tablet Take 40 mg by mouth daily at 6 PM.      . beclomethasone (QVAR) 80 MCG/ACT inhaler Inhale 2 puffs into  the lungs 2 (two) times daily.      . benazepril (LOTENSIN) 20 MG tablet Take 1 tablet (20 mg total) by mouth daily.  30 tablet  6  . Calcium Carbonate (CALCIUM 500 PO) Take 500 mg by mouth daily.      . cholecalciferol (VITAMIN D) 1000 UNITS tablet Take 1,000 Units by mouth daily.        Marland Kitchen esomeprazole (NEXIUM) 40 MG capsule Take 40 mg by mouth daily before breakfast.      . furosemide (LASIX) 20 MG tablet Take 20 mg by mouth daily.      . methimazole (TAPAZOLE) 5 MG tablet Take 5 mg by mouth every Monday, Wednesday, and Friday.      . metoprolol tartrate (LOPRESSOR) 25 MG tablet Take 25 mg by mouth 3 (three) times daily.      . nitroGLYCERIN (NITROSTAT) 0.4 MG SL tablet Place 0.4 mg under the tongue every 5 (five) minutes as needed for chest pain.      . ONE TOUCH ULTRA TEST test strip TEST EVERY DAY AS DIRECTED  100 each  11  . ONETOUCH DELICA LANCETS 25E MISC TEST EVERY DAY AS DIRECTED  200 each  2  . sitaGLIPtin (JANUVIA) 50 MG tablet Take 50 mg by mouth daily.      . TRAVATAN Z 0.004 % SOLN ophthalmic solution Place 1 drop into both eyes at bedtime.       Marland Kitchen apixaban (ELIQUIS) 2.5 MG TABS tablet Take 1 tablet (2.5 mg total) by mouth 2 (two) times daily.  60 tablet  6  . diltiazem (CARDIZEM CD) 120 MG 24 hr capsule Take 1 capsule (120 mg total) by mouth daily.  30 capsule  6  . [DISCONTINUED] potassium chloride (KLOR-CON 10) 10 MEQ CR tablet Take 1 tablet (10 mEq total) by mouth daily.  10 tablet  0   No current facility-administered medications for this visit.    Socially she is widowed. She lives with her grandson. She has 2 deceased children, 4 children alive as well as 4 grandchildren 3 great-grandchildren. There is no tobacco use.  Family History  Problem Relation Age of Onset  . Colon cancer Neg Hx   . Breast cancer Neg Hx   . Thyroid disease Neg Hx   . Coronary artery disease Son 41    deceased  . Heart attack Father   . Hypertension Brother   . Stroke Brother    FH is  noteworthy in that her father suffered an MI at age 38. Her mother died at childbirth.  ROS is negative for fever chills or night sweats. She denies recent headaches. There is no rash. She denies change in vision or hearing. She does have some mild dementia. There  is some occasional wheezing. She admits to being shortness of breath most of the time. She walks a walker. She denies any recent prolonged chest pain. She denies nausea vomiting or diarrhea. She is unaware of blood in stool or urine. There is no obvious claudication. At times his intermittent swelling. She denies tremors. She is diabetic. She denies cold or heat intolerance. She denies residual weakness. Other comprehensive 14 point system review is negative.  PE BP 120/80  Pulse 76  Ht 5\' 2"  (1.575 m)  Wt 170 lb 8 oz (77.338 kg)  BMI 31.18 kg/m2 General: Alert, oriented, no distress.  Skin: normal turgor, no rashes HEENT: Normocephalic, atraumatic. There is significant arcus senilis.  Pupils round and reactive; sclera anicteric; extraocular muscles intact; Fundi arteriolar narrowing. Nose without nasal septal hypertrophy Mouth/Parynx benign; Mallinpatti scale 2 Neck: No JVD, possible soft carotid bruits versus transmitted murmur; normal carotid upstroke Lungs: clear to ausculatation and percussion; no wheezing or rales Chest wall: without tenderness to palpitation  Heart: Irregular regular rhythm with a fairly controlled ventricular response in the 70s., s1 s2 normal; 2/6 systolic murmur at the right and left sternal border. No diastolic murmur. No rubs thrills or heaves Abdomen: soft, nontender; no hepatosplenomehaly, BS+; abdominal aorta nontender and not dilated by palpation. Back: no CVA tenderness Pulses 2+ Extremities: no clubbinbg cyanosis or edema, Homan's sign negative  Neurologic: grossly nonfocal; Cranial nerves grossly wnl Psychologic: Normal mood and affect  ECG (Independently read by me): Atrial fibrillation at 76  beats per minute. QTc interval 445  ECG (independently read by me): Atrial flutter with variable block at approximately 65 beats per minute  LABS:  BMET    Component Value Date/Time   NA 137 08/28/2013 1824   K 3.4* 08/28/2013 1824   CL 101 08/28/2013 1824   CO2 22 08/28/2013 1824   GLUCOSE 136* 08/28/2013 1824   BUN 9 08/28/2013 1824   CREATININE 0.65 08/28/2013 1824   CREATININE 0.98 08/12/2013 0840   CALCIUM 8.5 08/28/2013 1824   GFRNONAA 77* 08/28/2013 1824   GFRAA 89* 08/28/2013 1824     Hepatic Function Panel     Component Value Date/Time   PROT 7.6 08/27/2013 1204   ALBUMIN 3.7 08/27/2013 1204   AST 25 08/27/2013 1204   ALT 19 08/27/2013 1204   ALKPHOS 97 08/27/2013 1204   BILITOT 0.5 08/27/2013 1204   BILIDIR 0.1 06/27/2010 1415   IBILI 0.3 06/27/2010 1415     CBC    Component Value Date/Time   WBC 6.4 08/28/2013 1824   RBC 3.84* 08/28/2013 1824   HGB 12.4 08/28/2013 1824   HCT 37.0 08/28/2013 1824   PLT 161 08/28/2013 1824   MCV 96.4 08/28/2013 1824   MCH 32.3 08/28/2013 1824   MCHC 33.5 08/28/2013 1824   RDW 13.4 08/28/2013 1824   LYMPHSABS 1.7 08/27/2013 1204   MONOABS 0.9 08/27/2013 1204   EOSABS 0.2 08/27/2013 1204   BASOSABS 0.0 08/27/2013 1204     BNP No results found for this basename: probnp    Lipid Panel     Component Value Date/Time   CHOL 111 08/28/2013 1824   TRIG 77 08/28/2013 1824   HDL 36* 08/28/2013 1824   CHOLHDL 3.1 08/28/2013 1824   VLDL 15 08/28/2013 1824   LDLCALC 60 08/28/2013 1824     RADIOLOGY: No results found.   ASSESSMENT AND PLAN:  [name] is an 80 year old African American female who is 17 years status post aortic valve  replacement with a pericardial tissue valve at which time she underwent single-vessel bypass with a saphenous vein graft placed to her RCA. A recent nuclear perfusion study showed a lateral defect with minimal peri-infarction ischemia was not felt to be high risk. She does have atrial fibrillation in the past was felt  to be a fall risk for Coumadin. She recently was found to have probable multiple embolic events leading to her neurologic symptoms. Anticoagulation with eloquence was recommended but she did have difficulty from an insurance standpoint with reference to this. Presently, I'm recommending she discontinue amlodipine and I will change this to Cardizem 120 mg daily for improved rate control of her atrial flutter ablation. I am starting her on eloquence but with her age of 80 years am recommending a low dose of  Eliquis 2.5 mg twice a day particularly in light of a previous history of fall risk on Coumadin. She will followup with Dr. Leonie Skinner from a neurologic standpoint. We did provide her with samples of eloquence today and will need to arrange with her insurance company the importance of this therapy particularly with her embolic events which occurred on aspirin and Plavix.  I will see her in 24 months for cardiology reevaluation.  Troy Sine, MD, [facility] 09/10/2013 1:39 PM

## 2013-09-14 ENCOUNTER — Other Ambulatory Visit: Payer: Self-pay | Admitting: Internal Medicine

## 2013-09-15 ENCOUNTER — Ambulatory Visit: Payer: Medicare Other | Admitting: Internal Medicine

## 2013-09-15 ENCOUNTER — Other Ambulatory Visit: Payer: Self-pay | Admitting: *Deleted

## 2013-09-15 MED ORDER — FUROSEMIDE 20 MG PO TABS: 20.0000 mg | ORAL_TABLET | Freq: Every day | ORAL | Status: DC

## 2013-09-15 MED ORDER — ESOMEPRAZOLE MAGNESIUM 40 MG PO CPDR: DELAYED_RELEASE_CAPSULE | ORAL | Status: DC

## 2013-09-16 ENCOUNTER — Encounter: Payer: Self-pay | Admitting: Internal Medicine

## 2013-09-16 ENCOUNTER — Ambulatory Visit: Payer: Medicare Other | Admitting: Cardiovascular Disease

## 2013-09-16 ENCOUNTER — Ambulatory Visit (INDEPENDENT_AMBULATORY_CARE_PROVIDER_SITE_OTHER): Payer: Medicare Other | Admitting: Internal Medicine

## 2013-09-16 VITALS — BP 124/78 | HR 70 | Temp 98.0°F | Ht 67.0 in | Wt 174.0 lb

## 2013-09-16 DIAGNOSIS — I639 Cerebral infarction, unspecified: Secondary | ICD-10-CM

## 2013-09-16 DIAGNOSIS — F03918 Unspecified dementia, unspecified severity, with other behavioral disturbance: Secondary | ICD-10-CM | POA: Insufficient documentation

## 2013-09-16 DIAGNOSIS — R413 Other amnesia: Secondary | ICD-10-CM

## 2013-09-16 DIAGNOSIS — F0390 Unspecified dementia without behavioral disturbance: Secondary | ICD-10-CM | POA: Insufficient documentation

## 2013-09-16 DIAGNOSIS — I6350 Cerebral infarction due to unspecified occlusion or stenosis of unspecified cerebral artery: Secondary | ICD-10-CM

## 2013-09-16 DIAGNOSIS — E119 Type 2 diabetes mellitus without complications: Secondary | ICD-10-CM

## 2013-09-16 DIAGNOSIS — I2510 Atherosclerotic heart disease of native coronary artery without angina pectoris: Secondary | ICD-10-CM

## 2013-09-16 NOTE — Assessment & Plan Note (Signed)
Recovering well from a stroke, memory has definitely decreased since then. PT, OT at home. To see neuro In few months

## 2013-09-16 NOTE — Patient Instructions (Signed)
Next visit is for routine check up in 2 or 3 months

## 2013-09-16 NOTE — Assessment & Plan Note (Signed)
Well-controlled per last A1c

## 2013-09-16 NOTE — Progress Notes (Signed)
Subjective:    Patient ID: Rebecca Skinner, female    DOB: May 17, 1923, 80 y.o.   MRN: [id number]  DOS:  09/16/2013 Type of  Visit:Hospital followup  Hospital d/c summary reviewed, see below. Labs and x-rays reviewed as well.  Date of Admission: 08/27/2013  Date of Discharge: 08/31/2013    Discharge Diagnoses:  multiple left brain infarct in the setting of an old right infarct, s/p IV tpa, felt to be embolic secondary to known atrial fibrillation.  atrial fibrillation  Hypertension  Hyperlipidemia  Diabetes  CAD  Baseline memory deficit   Hospital Course Patient tolerated tPA without complication. Imaging at 24 hours shows no hemorrhage. Imaging confirmed multiple left brain infarct in the setting of an old right infarct. Infarcts felt to be embolic secondary to known atrial fibrillation. She has been on anticoagulation in the past, but family unsure why she was taken off anticoagulation. She was on aspirin 81 mg and Plavix 75 mg daily prior to admission. She was changed to Eliquis 5 mg bid at time of discharge.  Patient with vascular risk factors of:  atrial fibrillation  Hypertension  Hyperlipidemia, LDL 60, on Lipitor 40 mg PTA, now on Lipitor 40 mg daily, at goal LDL < 70 for diabetics  Diabetes, HgbA1c 6.6, at goal < 7.0  CAD   Baseline memory deficit-- Patient with continued stroke symptoms of impulsivity, decreased judgment, difficulty with ambulation, shortness of breath. Physical therapy, occupational therapy and speech therapy evaluated patient. They felt she was too high level for inpatient rehabilitation. Family's agreement, she was orderedvHome health PT, OT, Psychologist, counselling and RN.       ROS She is here with her oldest  daughter, since she left the hospital she is doing Very well according to the daughter. Doing PT and OT. They report her memory has decrease a little since the stroke  but otherwise she is back to her normal self. Had some bruising while in the  hospital---> now better, no blood in the urine or in the stools. No depression-anxiety. When asked, admits to some palpitations and sometimes chest pain, due to their decreased memory is hard to get more information, the chest pain  that happens sometimes, it is not severe and is going on for a while. No lower extremity edema.  Past Medical History  Diagnosis Date  . Asthma   . Diabetes mellitus   . Hypertension   . Hyperlipidemia   . CAD (coronary artery disease)     s/p CABG s/p AO valve replacement ([place] [facility])  . Recurrent UTI   . Paroxysmal atrial fibrillation     cards d/c coumadin 12/2008 d/t persisten NSR and frequent falls- restarted coumadin july 2011  . Hyperthyroidism   . Dry skin   . Osteoarthritis   . Osteopenia     per dexa 12/09  . Keloid     @ chest  . Dizziness     Chronic, admiet 07-2010,saw neuro, thought to be a peripheral issue   . Anxiety 11/20/2011  . CVA (cerebral infarction) 08-2013    multiple, L, d/t Afib, started eloquis  . Memory loss     Past Surgical History  Procedure Laterality Date  . Abdominal hysterectomy    . Total knee arthroplasty  1999  . Oophorectomy    . Cagb  1998  . Cesarean section      x 2  . Hemorrhoid surgery      History   Social History  .  Marital Status: Divorced    Spouse Name: N/A    Number of Children: 67  . Years of Education: N/A   Occupational History  . retired     Social History Main Topics  . Smoking status: Former Research scientist (life sciences)  . Smokeless tobacco: Never Used     Comment: quit 2004, smoked 1.5 ppd  . Alcohol Use: No  . Drug Use: No  . Sexual Activity: Not on file   Other Topics Concern  . Not on file   Social History Narrative   Lives w/ one of her daughters since admission 3-15   Had 3 daughter- 2 son  (lost oldest daughter and son), 2 living daughters in [place]        Medication List       This list is accurate as of: 09/16/13  3:02 PM.  Always use your most recent med list.                albuterol 108 (90 BASE) MCG/ACT inhaler  Commonly known as:  PROVENTIL HFA;VENTOLIN HFA  Inhale 2 puffs into the lungs every 6 (six) hours as needed for wheezing.     ALPRAZolam 0.5 MG tablet  Commonly known as:  XANAX  Take 0.5 mg by mouth at bedtime as needed for anxiety or sleep.     apixaban 2.5 MG Tabs tablet  Commonly known as:  ELIQUIS  Take 1 tablet (2.5 mg total) by mouth 2 (two) times daily.     aspirin 81 MG tablet  Take 81 mg by mouth daily.     atorvastatin 40 MG tablet  Commonly known as:  LIPITOR  Take 40 mg by mouth daily at 6 PM.     beclomethasone 80 MCG/ACT inhaler  Commonly known as:  QVAR  Inhale 2 puffs into the lungs 2 (two) times daily.     benazepril 20 MG tablet  Commonly known as:  LOTENSIN  Take 1 tablet (20 mg total) by mouth daily.     CALCIUM 500 PO  Take 500 mg by mouth daily.     cholecalciferol 1000 UNITS tablet  Commonly known as:  VITAMIN D  Take 1,000 Units by mouth daily.     diltiazem 120 MG 24 hr capsule  Commonly known as:  CARDIZEM CD  Take 1 capsule (120 mg total) by mouth daily.     esomeprazole 40 MG capsule  Commonly known as:  NEXIUM  Take 40 mg by mouth daily before breakfast.     furosemide 20 MG tablet  Commonly known as:  LASIX  Take 1 tablet (20 mg total) by mouth daily.     methimazole 5 MG tablet  Commonly known as:  TAPAZOLE  Take 5 mg by mouth every Monday, Wednesday, and Friday.     metoprolol tartrate 25 MG tablet  Commonly known as:  LOPRESSOR  Take 25 mg by mouth 3 (three) times daily.     nitroGLYCERIN 0.4 MG SL tablet  Commonly known as:  NITROSTAT  Place 0.4 mg under the tongue every 5 (five) minutes as needed for chest pain.     ONE TOUCH ULTRA TEST test strip  Generic drug:  glucose blood  TEST EVERY DAY AS DIRECTED     ONETOUCH DELICA LANCETS 55D Misc  TEST EVERY DAY AS DIRECTED     sitaGLIPtin 50 MG tablet  Commonly known as:  JANUVIA  Take 50 mg by mouth daily.      TRAVATAN Z 0.004 % Soln  ophthalmic solution  Generic drug:  Travoprost (BAK Free)  Place 1 drop into both eyes at bedtime.           Objective:   Physical Exam BP 124/78  Pulse 70  Temp(Src) 98 F (36.7 C)  Ht 5\' 7"  (1.702 m)  Wt 174 lb (78.926 kg)  BMI 27.25 kg/m2  SpO2 100% General -- alert, well-developed, NAD.  Lungs -- normal respiratory effort, no intercostal retractions, no accessory muscle use, and normal breath sounds.  Heart-- seems regular today    Extremities-- no pretibial edema bilaterally  Neurologic--   Alert, pleasant, follow commands, Some difficulty concentrating Not oriented to time, recognizes her daughter and me, knows she is at the doctor office, confused about where she is living  (Moved with her daughter after the stroke) Speech is fluent, gait steady w/ a walker , strength and face symmetric  Psych-- No anxious or depressed appearing.      Assessment & Plan:

## 2013-09-16 NOTE — Assessment & Plan Note (Signed)
Decreased memory since the stroke last month. reassess when she comes back, Aricept?. Will also need to check U76, folic acid, although decreased memory likely related to stroke

## 2013-09-16 NOTE — Assessment & Plan Note (Signed)
History of CAD, stress test 03/ 2015 was intermediate risk. Reports chest pain, unclear for how long. Recommend observation for now, to call if symptoms increase.

## 2013-09-17 ENCOUNTER — Telehealth: Payer: Self-pay

## 2013-09-17 NOTE — Telephone Encounter (Addendum)
Prior authorization for Eliquis 2.5 mg faxed to patient's insurance company via [email or web address]. Awaiting approval. 

## 2013-10-21 ENCOUNTER — Other Ambulatory Visit: Payer: Self-pay | Admitting: *Deleted

## 2013-10-21 MED ORDER — METHIMAZOLE 5 MG PO TABS: 5.0000 mg | ORAL_TABLET | ORAL | Status: DC

## 2013-11-10 ENCOUNTER — Encounter: Payer: Self-pay | Admitting: Internal Medicine

## 2013-11-10 ENCOUNTER — Ambulatory Visit (INDEPENDENT_AMBULATORY_CARE_PROVIDER_SITE_OTHER): Payer: Medicare Other | Admitting: Internal Medicine

## 2013-11-10 VITALS — BP 131/78 | HR 64 | Temp 97.9°F | Wt 172.0 lb

## 2013-11-10 DIAGNOSIS — I10 Essential (primary) hypertension: Secondary | ICD-10-CM

## 2013-11-10 DIAGNOSIS — R413 Other amnesia: Secondary | ICD-10-CM

## 2013-11-10 DIAGNOSIS — J45909 Unspecified asthma, uncomplicated: Secondary | ICD-10-CM

## 2013-11-10 DIAGNOSIS — E119 Type 2 diabetes mellitus without complications: Secondary | ICD-10-CM

## 2013-11-10 LAB — GLUCOSE, POCT (MANUAL RESULT ENTRY): POC GLUCOSE: 88 mg/dL (ref 70–99)

## 2013-11-10 MED ORDER — PREDNISONE 10 MG PO TABS: 20.0000 mg | ORAL_TABLET | Freq: Every day | ORAL | Status: DC

## 2013-11-10 NOTE — Progress Notes (Signed)
Subjective:    Patient ID: Rebecca Skinner, female    DOB: March 15, 1923, 80 y.o.   MRN: [id number]  DOS:  11/10/2013 Type of  Visit: ROV, here by herself History: Asthma, reports 1 or 2 week history of increased cough and wheezing. As far as her h/o  stroke, she is back living at home, feeling better. Also, noted a "bump" in the buttock, it is decreasing in size. No pain or discharge.   ROS No fever chills, no sinus pain or congestion. Shortness of breath is slightly more than baseline. Mild sputum production, white, no hemoptysis  Past Medical History  Diagnosis Date  . Asthma   . Diabetes mellitus   . Hypertension   . Hyperlipidemia   . CAD (coronary artery disease)     s/p CABG s/p AO valve replacement ([place] [facility])  . Recurrent UTI   . Paroxysmal atrial fibrillation     cards d/c coumadin 12/2008 d/t persisten NSR and frequent falls- restarted coumadin july 2011, now on Eliquis  . Hyperthyroidism   . Dry skin   . Osteoarthritis   . Osteopenia     per dexa 12/09  . Keloid     @ chest  . Dizziness     Chronic, admiet 07-2010,saw neuro, thought to be a peripheral issue   . Anxiety 11/20/2011  . CVA (cerebral infarction) 08-2013    multiple, L, d/t Afib, started eloquis  . Memory loss     Past Surgical History  Procedure Laterality Date  . Abdominal hysterectomy    . Total knee arthroplasty  1999  . Oophorectomy    . Cagb  1998  . Cesarean section      x 2  . Hemorrhoid surgery      History   Social History  . Marital Status: Divorced    Spouse Name: N/A    Number of Children: 29  . Years of Education: N/A   Occupational History  . retired     Social History Main Topics  . Smoking status: Former Research scientist (life sciences)  . Smokeless tobacco: Never Used     Comment: quit 2004, smoked 1.5 ppd  . Alcohol Use: No  . Drug Use: No  . Sexual Activity: Not on file   Other Topics Concern  . Not on file   Social History Narrative   Back living at her house since the last  visit, GGson lives w/ her (78 y/o)   Had 3 daughter- 2 son  (lost oldest daughter and son), 2 living daughters in [place]        Medication List       This list is accurate as of: 11/10/13 11:59 PM.  Always use your most recent med list.               albuterol 108 (90 BASE) MCG/ACT inhaler  Commonly known as:  PROVENTIL HFA;VENTOLIN HFA  Inhale 2 puffs into the lungs every 6 (six) hours as needed for wheezing.     ALPRAZolam 0.5 MG tablet  Commonly known as:  XANAX  Take 0.5 mg by mouth at bedtime as needed for anxiety or sleep.     apixaban 2.5 MG Tabs tablet  Commonly known as:  ELIQUIS  Take 1 tablet (2.5 mg total) by mouth 2 (two) times daily.     aspirin 81 MG tablet  Take 81 mg by mouth daily.     atorvastatin 40 MG tablet  Commonly known as:  LIPITOR  Take  40 mg by mouth daily at 6 PM.     beclomethasone 80 MCG/ACT inhaler  Commonly known as:  QVAR  Inhale 2 puffs into the lungs 2 (two) times daily.     benazepril 20 MG tablet  Commonly known as:  LOTENSIN  Take 1 tablet (20 mg total) by mouth daily.     CALCIUM 500 PO  Take 500 mg by mouth daily.     cholecalciferol 1000 UNITS tablet  Commonly known as:  VITAMIN D  Take 1,000 Units by mouth daily.     diltiazem 120 MG 24 hr capsule  Commonly known as:  CARDIZEM CD  Take 1 capsule (120 mg total) by mouth daily.     esomeprazole 40 MG capsule  Commonly known as:  NEXIUM  Take 40 mg by mouth daily before breakfast.     furosemide 20 MG tablet  Commonly known as:  LASIX  Take 1 tablet (20 mg total) by mouth daily.     methimazole 5 MG tablet  Commonly known as:  TAPAZOLE  Take 1 tablet (5 mg total) by mouth every Monday, Wednesday, and Friday.     metoprolol tartrate 25 MG tablet  Commonly known as:  LOPRESSOR  Take 25 mg by mouth 3 (three) times daily.     nitroGLYCERIN 0.4 MG SL tablet  Commonly known as:  NITROSTAT  Place 0.4 mg under the tongue every 5 (five) minutes as needed for chest  pain.     ONE TOUCH ULTRA TEST test strip  Generic drug:  glucose blood  TEST EVERY DAY AS DIRECTED     ONETOUCH DELICA LANCETS 50T Misc  TEST EVERY DAY AS DIRECTED     predniSONE 10 MG tablet  Commonly known as:  DELTASONE  Take 2 tablets (20 mg total) by mouth daily.     sitaGLIPtin 50 MG tablet  Commonly known as:  JANUVIA  Take 50 mg by mouth daily.     TRAVATAN Z 0.004 % Soln ophthalmic solution  Generic drug:  Travoprost (BAK Free)  Place 1 drop into both eyes at bedtime.           Objective:   Physical Exam  Skin:      BP 131/78  Pulse 64  Temp(Src) 97.9 F (36.6 C)  Wt 172 lb (78.019 kg)  SpO2 98% General -- alert, well-developed, NAD.    Lungs -- normal respiratory effort, no intercostal retractions, no accessory muscle use, + wheezing, mild to moderate. No rhonchi crackles  Heart-- irreg Extremities-- no pretibial edema bilaterally  Neurologic--  alert & oriented to self, time, place. Speech normal, gait appropriate for age  Psych-- Cognition and judgment appear appropriate. Cooperative with normal attention span and concentration (HOH, limiting her ability to communicate). No anxious or depressed appearing.          Assessment & Plan:   Resolving boil, recommend observation

## 2013-11-10 NOTE — Patient Instructions (Addendum)
Take prednisone as prescribed for 5 days Take mucinex DM twice a day until wheezing better Use  Qvar 2 puffs twice a day Use albuterol only as needed If no improvement in the next few days, please call the office  Next visit in 2-3 months

## 2013-11-10 NOTE — Progress Notes (Signed)
Pre visit review using our clinic review tool, if applicable. No additional management support is needed unless otherwise documented below in the visit note. 

## 2013-11-10 NOTE — Assessment & Plan Note (Signed)
Controlled, no change

## 2013-11-10 NOTE — Assessment & Plan Note (Signed)
The patient is here by herself, I don't have a fam member to tell me how she is doing. Overall her mental status seems improved compared to last time I saw her after the stroke. She is definitely alert and oriented x3. Plan: Observation

## 2013-11-10 NOTE — Assessment & Plan Note (Signed)
Symptoms increased x 2 weeks, will prescribe prednisone and Mucinex. She has diabetes, CBG this morning 88, she does not have a glucometer, low risk for increased CBGs w/  a small dose of prednisone. If not better, she will call, antibiotics?

## 2013-11-11 ENCOUNTER — Encounter: Payer: Self-pay | Admitting: Endocrinology

## 2013-11-11 ENCOUNTER — Ambulatory Visit (INDEPENDENT_AMBULATORY_CARE_PROVIDER_SITE_OTHER): Payer: Medicare Other | Admitting: Endocrinology

## 2013-11-11 VITALS — BP 114/60 | HR 75 | Temp 97.9°F | Ht 62.0 in | Wt 171.0 lb

## 2013-11-11 DIAGNOSIS — E0590 Thyrotoxicosis, unspecified without thyrotoxic crisis or storm: Secondary | ICD-10-CM

## 2013-11-11 LAB — TSH: TSH: 0.61 u[IU]/mL (ref 0.35–4.50)

## 2013-11-11 NOTE — Progress Notes (Signed)
Subjective:    Patient ID: Rebecca Skinner, female    DOB: 11-Feb-1923, 80 y.o.   MRN: [id number]  HPI pt returns for f/u of hyperthyroidism (due to multinodular goiter; dx'ed 2008; tapazole was chosen as initial rx, due to AF; due to increasing tsh, this was reduced, then stopped altogether in 2011; also in 2011, US showed small multinodular goiter; in early 2012, hyperthyroidism recurred; the plan was for scanning, then i-131 rx, but a repeat TSH was normal. the TSH did not go low again until mid-2014; she chose to resume tapazole; in October of 2014, tapazole was reduced, due to normalization of her TFT).  pt reports mild palpitations in the chest, but no assoc sxs.  She takes synthroid as rx'ed.   Past Medical History  Diagnosis Date  . Asthma   . Diabetes mellitus   . Hypertension   . Hyperlipidemia   . CAD (coronary artery disease)     s/p CABG s/p AO valve replacement ([place] [facility])  . Recurrent UTI   . Paroxysmal atrial fibrillation     cards d/c coumadin 12/2008 d/t persisten NSR and frequent falls- restarted coumadin july 2011, now on Eliquis  . Hyperthyroidism   . Dry skin   . Osteoarthritis   . Osteopenia     per dexa 12/09  . Keloid     @ chest  . Dizziness     Chronic, admiet 07-2010,saw neuro, thought to be a peripheral issue   . Anxiety 11/20/2011  . CVA (cerebral infarction) 08-2013    multiple, L, d/t Afib, started eloquis  . Memory loss     Past Surgical History  Procedure Laterality Date  . Abdominal hysterectomy    . Total knee arthroplasty  1999  . Oophorectomy    . Cagb  1998  . Cesarean section      x 2  . Hemorrhoid surgery      History   Social History  . Marital Status: Divorced    Spouse Name: N/A    Number of Children: 95  . Years of Education: N/A   Occupational History  . retired     Social History Main Topics  . Smoking status: Former Research scientist (life sciences)  . Smokeless tobacco: Never Used     Comment: quit 2004, smoked 1.5 ppd  . Alcohol Use: No    . Drug Use: No  . Sexual Activity: Not on file   Other Topics Concern  . Not on file   Social History Narrative   Back living at her house since the last visit, GGson lives w/ her (78 y/o)   Had 3 daughter- 2 son  (lost oldest daughter and son), 2 living daughters in [place]    Current Outpatient Prescriptions on File Prior to Visit  Medication Sig Dispense Refill  . albuterol (PROVENTIL HFA;VENTOLIN HFA) 108 (90 BASE) MCG/ACT inhaler Inhale 2 puffs into the lungs every 6 (six) hours as needed for wheezing.      Marland Kitchen ALPRAZolam (XANAX) 0.5 MG tablet Take 0.5 mg by mouth at bedtime as needed for anxiety or sleep.      Marland Kitchen apixaban (ELIQUIS) 2.5 MG TABS tablet Take 1 tablet (2.5 mg total) by mouth 2 (two) times daily.  60 tablet  6  . aspirin 81 MG tablet Take 81 mg by mouth daily.      Marland Kitchen atorvastatin (LIPITOR) 40 MG tablet Take 40 mg by mouth daily at 6 PM.      . beclomethasone (QVAR)  80 MCG/ACT inhaler Inhale 2 puffs into the lungs 2 (two) times daily.      . benazepril (LOTENSIN) 20 MG tablet Take 1 tablet (20 mg total) by mouth daily.  30 tablet  6  . Calcium Carbonate (CALCIUM 500 PO) Take 500 mg by mouth daily.      . cholecalciferol (VITAMIN D) 1000 UNITS tablet Take 1,000 Units by mouth daily.        Marland Kitchen diltiazem (CARDIZEM CD) 120 MG 24 hr capsule Take 1 capsule (120 mg total) by mouth daily.  30 capsule  6  . esomeprazole (NEXIUM) 40 MG capsule Take 40 mg by mouth daily before breakfast.      . furosemide (LASIX) 20 MG tablet Take 1 tablet (20 mg total) by mouth daily.  30 tablet  6  . methimazole (TAPAZOLE) 5 MG tablet Take 1 tablet (5 mg total) by mouth every Monday, Wednesday, and Friday.  40 tablet  0  . metoprolol tartrate (LOPRESSOR) 25 MG tablet Take 25 mg by mouth 3 (three) times daily.      . nitroGLYCERIN (NITROSTAT) 0.4 MG SL tablet Place 0.4 mg under the tongue every 5 (five) minutes as needed for chest pain.      . ONE TOUCH ULTRA TEST test strip TEST EVERY DAY AS  DIRECTED  100 each  11  . ONETOUCH DELICA LANCETS 28Z MISC TEST EVERY DAY AS DIRECTED  200 each  2  . predniSONE (DELTASONE) 10 MG tablet Take 2 tablets (20 mg total) by mouth daily.  10 tablet  0  . sitaGLIPtin (JANUVIA) 50 MG tablet Take 50 mg by mouth daily.      . TRAVATAN Z 0.004 % SOLN ophthalmic solution Place 1 drop into both eyes at bedtime.       . [DISCONTINUED] potassium chloride (KLOR-CON 10) 10 MEQ CR tablet Take 1 tablet (10 mEq total) by mouth daily.  10 tablet  0   No current facility-administered medications on file prior to visit.    Allergies  Allergen Reactions  . Hydrocodone     REACTION: itching/nausea  . Tramadol Hcl     REACTION: itching , upset stomach (08-2009)    Family History  Problem Relation Age of Onset  . Colon cancer Neg Hx   . Breast cancer Neg Hx   . Thyroid disease Neg Hx   . Coronary artery disease Son 53    deceased  . Heart attack Father   . Hypertension Brother   . Stroke Brother     BP 114/60  Pulse 75  Temp(Src) 97.9 F (36.6 C) (Oral)  Ht 5\' 2"  (1.575 m)  Wt 171 lb (77.565 kg)  BMI 31.27 kg/m2  SpO2 98%  Review of Systems Denies fever and weight change.      Objective:   Physical Exam VITAL SIGNS:  See vs page GENERAL: no distress NECK: small multinodular goiter.    i reviewed electrocardiogram Lab Results  Component Value Date   TSH 0.61 11/11/2013      Assessment & Plan:  multinodular goiter: clinically unchanged. Hyperthyroidism, well-controlled.   Patient is advised the following: Patient Instructions  A thyroid blood test is requested for you today.  We'll contact you with results.   if ever you have fever while taking methimazole, stop it and call us, because of the risk of a rare side-effect.   Please come back for a follow-up appointment in 4 months.

## 2013-11-11 NOTE — Patient Instructions (Addendum)
A thyroid blood test is requested for you today.  We'll contact you with results.   if ever you have fever while taking methimazole, stop it and call us, because of the risk of a rare side-effect.   Please come back for a follow-up appointment in 4 months.   

## 2013-12-16 ENCOUNTER — Encounter: Payer: Self-pay | Admitting: Internal Medicine

## 2013-12-16 ENCOUNTER — Ambulatory Visit (INDEPENDENT_AMBULATORY_CARE_PROVIDER_SITE_OTHER): Payer: Medicare Other | Admitting: Internal Medicine

## 2013-12-16 VITALS — BP 135/71 | HR 70 | Temp 98.2°F | Wt 168.0 lb

## 2013-12-16 DIAGNOSIS — I2510 Atherosclerotic heart disease of native coronary artery without angina pectoris: Secondary | ICD-10-CM

## 2013-12-16 DIAGNOSIS — E119 Type 2 diabetes mellitus without complications: Secondary | ICD-10-CM

## 2013-12-16 DIAGNOSIS — I10 Essential (primary) hypertension: Secondary | ICD-10-CM

## 2013-12-16 LAB — BASIC METABOLIC PANEL
BUN: 11 mg/dL (ref 6–23)
CALCIUM: 9.3 mg/dL (ref 8.4–10.5)
CO2: 25 meq/L (ref 19–32)
Chloride: 102 mEq/L (ref 96–112)
Creatinine, Ser: 1 mg/dL (ref 0.4–1.2)
GFR: 67.93 mL/min (ref >60.00)
Glucose, Bld: 100 mg/dL — ABNORMAL HIGH (ref 70–99)
Potassium: 3.6 mEq/L (ref 3.5–5.1)
SODIUM: 136 meq/L (ref 135–145)

## 2013-12-16 LAB — HEMOGLOBIN A1C: HEMOGLOBIN A1C: 6.9 % — AB (ref 4.6–6.5)

## 2013-12-16 NOTE — Progress Notes (Signed)
Subjective:    Patient ID: Rebecca Skinner, female    DOB: 04/02/1923, 80 y.o.   MRN: [id number]  DOS:  12/16/2013 Type of visit - description: routine,   here by herself History:  States that she is feeling well and currently has no concerns. Meds list reviewed, states she is taking "all medications" however she had a hard time recognizing the names of the medicines or give me any specifics.   ROS Denies chest pain or difficulty breathing No  nausea, vomiting, diarrhea blood in the stools. No gross hematuria   Past Medical History  Diagnosis Date  . Asthma   . Diabetes mellitus   . Hypertension   . Hyperlipidemia   . CAD (coronary artery disease)     s/p CABG s/p AO valve replacement ([facility] [place] [facility])  . Recurrent UTI   . Paroxysmal atrial fibrillation     cards d/c coumadin 12/2008 d/t persisten NSR and frequent falls- restarted coumadin july 2011, now on Eliquis  . Hyperthyroidism   . Dry skin   . Osteoarthritis   . Osteopenia     per dexa 12/09  . Keloid     @ chest  . Dizziness     Chronic, admiet 07-2010,saw neuro, thought to be a peripheral issue   . Anxiety 11/20/2011  . CVA (cerebral infarction) 08-2013    multiple, L, d/t Afib, started eloquis  . Memory loss     Past Surgical History  Procedure Laterality Date  . Abdominal hysterectomy    . Total knee arthroplasty  1999  . Oophorectomy    . Cagb  1998  . Cesarean section      x 2  . Hemorrhoid surgery      History   Social History  . Marital Status: Divorced    Spouse Name: N/A    Number of Children: 52  . Years of Education: N/A   Occupational History  . retired     Social History Main Topics  . Smoking status: Former Research scientist (life sciences)  . Smokeless tobacco: Never Used     Comment: quit 2004, smoked 1.5 ppd  . Alcohol Use: No  . Drug Use: No  . Sexual Activity: Not on file   Other Topics Concern  . Not on file   Social History Narrative   Back living at her house since the last visit, GGson lives w/  her (78 y/o)   Had 3 daughter- 2 son  (lost oldest daughter and son), 2 living daughters in [place]        Medication List       This list is accurate as of: 12/16/13 10:44 AM.  Always use your most recent med list.               albuterol 108 (90 BASE) MCG/ACT inhaler  Commonly known as:  PROVENTIL HFA;VENTOLIN HFA  Inhale 2 puffs into the lungs every 6 (six) hours as needed for wheezing.     ALPRAZolam 0.5 MG tablet  Commonly known as:  XANAX  Take 0.5 mg by mouth at bedtime as needed for anxiety or sleep.     apixaban 2.5 MG Tabs tablet  Commonly known as:  ELIQUIS  Take 1 tablet (2.5 mg total) by mouth 2 (two) times daily.     aspirin 81 MG tablet  Take 81 mg by mouth daily.     atorvastatin 40 MG tablet  Commonly known as:  LIPITOR  Take 40 mg by mouth daily  at 6 PM.     beclomethasone 80 MCG/ACT inhaler  Commonly known as:  QVAR  Inhale 2 puffs into the lungs 2 (two) times daily.     benazepril 20 MG tablet  Commonly known as:  LOTENSIN  Take 1 tablet (20 mg total) by mouth daily.     CALCIUM 500 PO  Take 500 mg by mouth daily.     cholecalciferol 1000 UNITS tablet  Commonly known as:  VITAMIN D  Take 1,000 Units by mouth daily.     diltiazem 120 MG 24 hr capsule  Commonly known as:  CARDIZEM CD  Take 1 capsule (120 mg total) by mouth daily.     esomeprazole 40 MG capsule  Commonly known as:  NEXIUM  Take 40 mg by mouth daily before breakfast.     furosemide 20 MG tablet  Commonly known as:  LASIX  Take 1 tablet (20 mg total) by mouth daily.     methimazole 5 MG tablet  Commonly known as:  TAPAZOLE  Take 1 tablet (5 mg total) by mouth every Monday, Wednesday, and Friday.     metoprolol tartrate 25 MG tablet  Commonly known as:  LOPRESSOR  Take 25 mg by mouth 3 (three) times daily.     nitroGLYCERIN 0.4 MG SL tablet  Commonly known as:  NITROSTAT  Place 0.4 mg under the tongue every 5 (five) minutes as needed for chest pain.     ONE TOUCH  ULTRA TEST test strip  Generic drug:  glucose blood  TEST EVERY DAY AS DIRECTED     ONETOUCH DELICA LANCETS 75Z Misc  TEST EVERY DAY AS DIRECTED     predniSONE 10 MG tablet  Commonly known as:  DELTASONE  Take 2 tablets (20 mg total) by mouth daily.     sitaGLIPtin 50 MG tablet  Commonly known as:  JANUVIA  Take 50 mg by mouth daily.     TRAVATAN Z 0.004 % Soln ophthalmic solution  Generic drug:  Travoprost (BAK Free)  Place 1 drop into both eyes at bedtime.           Objective:   Physical Exam BP 135/71  Pulse 70  Temp(Src) 98.2 F (36.8 C)  Wt 168 lb (76.204 kg)  SpO2 95%  General -- alert, well-developed, NAD.   Lungs -- normal respiratory effort, no intercostal retractions, no accessory muscle use, and normal breath sounds.  Heart--irreg Extremities-- no pretibial edema bilaterally  Neurologic--  alert & oriented X3. Speech normal, gait ok w/ a walker   Psych--   No anxious or depressed appearing.       Assessment & Plan:   I encouraged the patient to recruit his family  to help take her numerous medicines correctly, she is quite reluctant to ask for help

## 2013-12-16 NOTE — Assessment & Plan Note (Signed)
On ASA and Eloquis d/t atrial fibrillation, does not seem to have problems w/ bleeding

## 2013-12-16 NOTE — Progress Notes (Signed)
Pre visit review using our clinic review tool, if applicable. No additional management support is needed unless otherwise documented below in the visit note. 

## 2013-12-16 NOTE — Assessment & Plan Note (Signed)
Labs

## 2013-12-16 NOTE — Patient Instructions (Addendum)
Get your blood work before you leave     Next visit is for routine check up regards your blood sugar , blood pressure in 4 months  No need to come back fasting Please make an appointment     Take the medicines as prescribed. Please ask your family to help you with the medication to be sure you take it correctly

## 2013-12-16 NOTE — Assessment & Plan Note (Addendum)
Seems controlled , check a BMP

## 2013-12-17 ENCOUNTER — Other Ambulatory Visit: Payer: Self-pay | Admitting: Internal Medicine

## 2013-12-17 ENCOUNTER — Encounter: Payer: Self-pay | Admitting: *Deleted

## 2014-01-02 ENCOUNTER — Other Ambulatory Visit: Payer: Self-pay | Admitting: Internal Medicine

## 2014-02-22 ENCOUNTER — Other Ambulatory Visit: Payer: Self-pay | Admitting: Internal Medicine

## 2014-03-01 ENCOUNTER — Other Ambulatory Visit: Payer: Self-pay

## 2014-03-01 MED ORDER — ESOMEPRAZOLE MAGNESIUM 40 MG PO CPDR: 40.0000 mg | DELAYED_RELEASE_CAPSULE | Freq: Every day | ORAL | Status: DC

## 2014-03-07 ENCOUNTER — Other Ambulatory Visit: Payer: Self-pay

## 2014-03-07 ENCOUNTER — Other Ambulatory Visit: Payer: Self-pay | Admitting: Cardiovascular Disease

## 2014-03-07 MED ORDER — ESOMEPRAZOLE MAGNESIUM 40 MG PO CPDR: 40.0000 mg | DELAYED_RELEASE_CAPSULE | Freq: Every day | ORAL | Status: DC

## 2014-03-07 NOTE — Telephone Encounter (Signed)
Rx was sent to pharmacy electronically. 

## 2014-03-11 ENCOUNTER — Other Ambulatory Visit: Payer: Self-pay | Admitting: Endocrinology

## 2014-03-18 ENCOUNTER — Ambulatory Visit: Payer: Medicare Other | Admitting: Endocrinology

## 2014-03-22 ENCOUNTER — Encounter: Payer: Self-pay | Admitting: Endocrinology

## 2014-03-22 ENCOUNTER — Ambulatory Visit (INDEPENDENT_AMBULATORY_CARE_PROVIDER_SITE_OTHER): Payer: Medicare Other | Admitting: Endocrinology

## 2014-03-22 VITALS — BP 120/78 | HR 92 | Temp 97.9°F | Ht 62.0 in | Wt 166.0 lb

## 2014-03-22 DIAGNOSIS — Z23 Encounter for immunization: Secondary | ICD-10-CM

## 2014-03-22 DIAGNOSIS — E0520 Thyrotoxicosis with toxic multinodular goiter without thyrotoxic crisis or storm: Secondary | ICD-10-CM

## 2014-03-22 LAB — TSH: TSH: 0.35 u[IU]/mL (ref 0.35–4.50)

## 2014-03-22 LAB — T4, FREE: Free T4: 0.85 ng/dL (ref 0.60–1.60)

## 2014-03-22 NOTE — Patient Instructions (Addendum)
A thyroid blood test is requested for you today.  We'll contact you with results.   if ever you have fever while taking methimazole, stop it and call us, because of the risk of a rare side-effect.   Please come back for a follow-up appointment in 4 months.

## 2014-03-22 NOTE — Progress Notes (Signed)
Subjective:    Patient ID: Rebecca Skinner, female    DOB: [birth year], 80 y.o.   MRN: [id number]  HPI pt returns for f/u of hyperthyroidism (due to multinodular goiter; dx'ed 2008; tapazole was chosen as initial rx, due to AF; due to increasing TSH, this was reduced, then stopped altogether in 2011; also in 2011, US showed small multinodular goiter; in early 2012, hyperthyroidism recurred; the plan was for scanning, then i-131 rx, but a repeat TSH was normal. the TSH did not go low again until mid-2014; she chose to resume tapazole; in October of 2014, tapazole was reduced, due to normalization of her TFT).  she takes synthroid as rx'ed.  She denies tremor Past Medical History  Diagnosis Date  . Asthma   . Diabetes mellitus   . Hypertension   . Hyperlipidemia   . CAD (coronary artery disease)     s/p CABG s/p AO valve replacement ([place] [facility])  . Recurrent UTI   . Paroxysmal atrial fibrillation     cards d/c coumadin 12/2008 d/t persisten NSR and frequent falls- restarted coumadin july 2011, now on Eliquis  . Hyperthyroidism   . Dry skin   . Osteoarthritis   . Osteopenia     per dexa 12/09  . Keloid     @ chest  . Dizziness     Chronic, admiet 07-2010,saw neuro, thought to be a peripheral issue   . Anxiety 11/20/2011  . CVA (cerebral infarction) 08-2013    multiple, L, d/t Afib, started eloquis  . Memory loss     Past Surgical History  Procedure Laterality Date  . Abdominal hysterectomy    . Total knee arthroplasty  1999  . Oophorectomy    . Cagb  1998  . Cesarean section      x 2  . Hemorrhoid surgery      History   Social History  . Marital Status: Divorced    Spouse Name: N/A    Number of Children: 82  . Years of Education: N/A   Occupational History  . retired     Social History Main Topics  . Smoking status: Former Research scientist (life sciences)  . Smokeless tobacco: Never Used     Comment: quit 2004, smoked 1.5 ppd  . Alcohol Use: No  . Drug Use: No  . Sexual Activity: Not on  file   Other Topics Concern  . Not on file   Social History Narrative   Back living at her house since the last visit, GGson lives w/ her (78 y/o)   Had 3 daughter- 2 son  (lost oldest daughter and son), 2 living daughters in [place]    Current Outpatient Prescriptions on File Prior to Visit  Medication Sig Dispense Refill  . albuterol (PROVENTIL HFA;VENTOLIN HFA) 108 (90 BASE) MCG/ACT inhaler Inhale 2 puffs into the lungs every 6 (six) hours as needed for wheezing.      Marland Kitchen ALPRAZolam (XANAX) 0.5 MG tablet Take 0.5 mg by mouth at bedtime as needed for anxiety or sleep.      Marland Kitchen apixaban (ELIQUIS) 2.5 MG TABS tablet Take 1 tablet (2.5 mg total) by mouth 2 (two) times daily.  60 tablet  6  . aspirin 81 MG tablet Take 81 mg by mouth daily.      Marland Kitchen atorvastatin (LIPITOR) 40 MG tablet Take 40 mg by mouth daily at 6 PM.      . beclomethasone (QVAR) 80 MCG/ACT inhaler Inhale 2 puffs into the lungs 2 (two)  times daily.      . benazepril (LOTENSIN) 20 MG tablet TAKE 1 TABLET (20 MG TOTAL) BY MOUTH DAILY.  30 tablet  7  . Calcium Carbonate (CALCIUM 500 PO) Take 500 mg by mouth daily.      . cholecalciferol (VITAMIN D) 1000 UNITS tablet Take 1,000 Units by mouth daily.        Marland Kitchen diltiazem (CARDIZEM CD) 120 MG 24 hr capsule TAKE 1 CAPSULE (120 MG TOTAL) BY MOUTH DAILY.  30 capsule  7  . esomeprazole (NEXIUM) 40 MG capsule Take 1 capsule (40 mg total) by mouth daily before breakfast.  30 capsule  3  . furosemide (LASIX) 20 MG tablet Take 1 tablet (20 mg total) by mouth daily.  30 tablet  6  . JANUVIA 50 MG tablet TAKE 1 TABLET BY MOUTH EVERY DAY  30 tablet  4  . methimazole (TAPAZOLE) 5 MG tablet TAKE 1 TABLET (5 MG TOTAL) BY MOUTH EVERY MONDAY, WEDNESDAY, AND FRIDAY.  40 tablet  0  . metoprolol tartrate (LOPRESSOR) 25 MG tablet Take 25 mg by mouth 3 (three) times daily.      . nitroGLYCERIN (NITROSTAT) 0.4 MG SL tablet Place 0.4 mg under the tongue every 5 (five) minutes as needed for chest pain.        . ONE TOUCH ULTRA TEST test strip TEST EVERY DAY AS DIRECTED  100 each  11  . ONETOUCH DELICA LANCETS 53I MISC TEST EVERY DAY AS DIRECTED  200 each  2  . QVAR 80 MCG/ACT inhaler INHALE 2 PUFFS INTO THE LUNGS TWICE DAILY  8.7 g  3  . TRAVATAN Z 0.004 % SOLN ophthalmic solution Place 1 drop into both eyes at bedtime.       . [DISCONTINUED] potassium chloride (KLOR-CON 10) 10 MEQ CR tablet Take 1 tablet (10 mEq total) by mouth daily.  10 tablet  0   No current facility-administered medications on file prior to visit.    Allergies  Allergen Reactions  . Hydrocodone     REACTION: itching/nausea  . Tramadol Hcl     REACTION: itching , upset stomach (08-2009)    Family History  Problem Relation Age of Onset  . Colon cancer Neg Hx   . Breast cancer Neg Hx   . Thyroid disease Neg Hx   . Coronary artery disease Son 51    deceased  . Heart attack Father   . Hypertension Brother   . Stroke Brother     BP 120/78  Pulse 92  Temp(Src) 97.9 F (36.6 C) (Oral)  Ht 5\' 2"  (1.575 m)  Wt 166 lb (75.297 kg)  BMI 30.35 kg/m2  SpO2 98%  Review of Systems Denies fever    Objective:   Physical Exam VITAL SIGNS:  See vs page GENERAL: no distress Skin: not diaphoretic Neuro: no tremor.    Lab Results  Component Value Date   TSH 0.35 03/22/2014      Assessment & Plan:  Hyperthyroidism: well-controlled.    Patient is advised the following: Patient Instructions  A thyroid blood test is requested for you today.  We'll contact you with results.   if ever you have fever while taking methimazole, stop it and call us, because of the risk of a rare side-effect.   Please come back for a follow-up appointment in 4 months.    addendum: same tapazole.

## 2014-03-25 ENCOUNTER — Ambulatory Visit (INDEPENDENT_AMBULATORY_CARE_PROVIDER_SITE_OTHER): Payer: Medicare Other | Admitting: Physician Assistant

## 2014-03-25 ENCOUNTER — Encounter: Payer: Self-pay | Admitting: Physician Assistant

## 2014-03-25 ENCOUNTER — Encounter (HOSPITAL_BASED_OUTPATIENT_CLINIC_OR_DEPARTMENT_OTHER): Payer: Self-pay | Admitting: Emergency Medicine

## 2014-03-25 ENCOUNTER — Emergency Department (HOSPITAL_BASED_OUTPATIENT_CLINIC_OR_DEPARTMENT_OTHER)
Admission: EM | Admit: 2014-03-25 | Discharge: 2014-03-25 | Disposition: A | Discharge status: Home / Self Care | Payer: Medicare Other | Source: Home / Self Care | Attending: Emergency Medicine | Admitting: Emergency Medicine

## 2014-03-25 VITALS — BP 179/75 | HR 73 | Temp 98.1°F | Resp 16 | Ht 62.0 in | Wt 163.0 lb

## 2014-03-25 DIAGNOSIS — I2510 Atherosclerotic heart disease of native coronary artery without angina pectoris: Secondary | ICD-10-CM

## 2014-03-25 DIAGNOSIS — F419 Anxiety disorder, unspecified: Secondary | ICD-10-CM | POA: Insufficient documentation

## 2014-03-25 DIAGNOSIS — M1990 Unspecified osteoarthritis, unspecified site: Secondary | ICD-10-CM

## 2014-03-25 DIAGNOSIS — E119 Type 2 diabetes mellitus without complications: Secondary | ICD-10-CM | POA: Insufficient documentation

## 2014-03-25 DIAGNOSIS — Z79899 Other long term (current) drug therapy: Secondary | ICD-10-CM

## 2014-03-25 DIAGNOSIS — K625 Hemorrhage of anus and rectum: Secondary | ICD-10-CM | POA: Diagnosis not present

## 2014-03-25 DIAGNOSIS — I10 Essential (primary) hypertension: Secondary | ICD-10-CM

## 2014-03-25 DIAGNOSIS — Z7951 Long term (current) use of inhaled steroids: Secondary | ICD-10-CM

## 2014-03-25 DIAGNOSIS — J45909 Unspecified asthma, uncomplicated: Secondary | ICD-10-CM | POA: Insufficient documentation

## 2014-03-25 DIAGNOSIS — Z7982 Long term (current) use of aspirin: Secondary | ICD-10-CM | POA: Insufficient documentation

## 2014-03-25 DIAGNOSIS — Z87891 Personal history of nicotine dependence: Secondary | ICD-10-CM | POA: Insufficient documentation

## 2014-03-25 DIAGNOSIS — R0602 Shortness of breath: Secondary | ICD-10-CM

## 2014-03-25 DIAGNOSIS — E785 Hyperlipidemia, unspecified: Secondary | ICD-10-CM

## 2014-03-25 DIAGNOSIS — K5521 Angiodysplasia of colon with hemorrhage: Secondary | ICD-10-CM | POA: Diagnosis not present

## 2014-03-25 LAB — CBC WITH DIFFERENTIAL/PLATELET
BASOS ABS: 0 10*3/uL (ref 0.0–0.1)
BASOS PCT: 0 % (ref 0–1)
EOS ABS: 0.2 10*3/uL (ref 0.0–0.7)
EOS PCT: 2 % (ref 0–5)
HEMATOCRIT: 35.2 % — AB (ref 36.0–46.0)
Hemoglobin: 11.4 g/dL — ABNORMAL LOW (ref 12.0–15.0)
Lymphocytes Relative: 20 % (ref 12–46)
Lymphs Abs: 1.6 10*3/uL (ref 0.7–4.0)
MCH: 32.3 pg (ref 26.0–34.0)
MCHC: 32.4 g/dL (ref 30.0–36.0)
MCV: 99.7 fL (ref 78.0–100.0)
MONO ABS: 1 10*3/uL (ref 0.1–1.0)
Monocytes Relative: 13 % — ABNORMAL HIGH (ref 3–12)
Neutro Abs: 5.2 10*3/uL (ref 1.7–7.7)
Neutrophils Relative %: 65 % (ref 43–77)
PLATELETS: 162 10*3/uL (ref 150–400)
RBC: 3.53 MIL/uL — ABNORMAL LOW (ref 3.87–5.11)
RDW: 12.9 % (ref 11.5–15.5)
WBC: 8 10*3/uL (ref 4.0–10.5)

## 2014-03-25 LAB — COMPREHENSIVE METABOLIC PANEL
ALBUMIN: 3.9 g/dL (ref 3.5–5.2)
ALT: 18 U/L (ref 0–35)
AST: 25 U/L (ref 0–37)
Alkaline Phosphatase: 114 U/L (ref 39–117)
Anion gap: 13 (ref 5–15)
BUN: 18 mg/dL (ref 6–23)
CALCIUM: 9.8 mg/dL (ref 8.4–10.5)
CO2: 28 mEq/L (ref 19–32)
CREATININE: 1 mg/dL (ref 0.50–1.10)
Chloride: 101 mEq/L (ref 96–112)
GFR calc Af Amer: 56 mL/min — ABNORMAL LOW (ref >90)
GFR calc non Af Amer: 48 mL/min — ABNORMAL LOW (ref >90)
Glucose, Bld: 100 mg/dL — ABNORMAL HIGH (ref 70–99)
Potassium: 4.2 mEq/L (ref 3.7–5.3)
SODIUM: 142 meq/L (ref 137–147)
TOTAL PROTEIN: 7.7 g/dL (ref 6.0–8.3)
Total Bilirubin: 0.4 mg/dL (ref 0.3–1.2)

## 2014-03-25 LAB — PROTIME-INR
INR: 1.35 (ref 0.00–1.49)
Prothrombin Time: 16.7 seconds — ABNORMAL HIGH (ref 11.6–15.2)

## 2014-03-25 LAB — OCCULT BLOOD X 1 CARD TO LAB, STOOL: FECAL OCCULT BLD: POSITIVE — AB

## 2014-03-25 MED ORDER — DOCUSATE SODIUM 100 MG PO CAPS: 100.0000 mg | ORAL_CAPSULE | Freq: Every day | ORAL | Status: DC

## 2014-03-25 NOTE — Progress Notes (Signed)
Patient presents to clinic today c/o 1 week of rectal bleeding, mostly with BM.  Denies overt abdominal pain.  Denies nausea or vomiting.  Endorses some intermittent chest pain and SOB. Has history of atrial fibrillation rate controlled with Diltiazem and Metoprolol.   Thinks she took her medications this am.  BP at 179/75 in clinic.  Endorses some mild CP at present.  EKG reveals Atrial fibrillation with ventricular rate within normal limits. No significant change since last EKG.  Patient also on chronic anticoagulation and is at increased risk for GI bleed.   Past Medical History  Diagnosis Date  . Asthma   . Diabetes mellitus   . Hypertension   . Hyperlipidemia   . CAD (coronary artery disease)     s/p CABG s/p AO valve replacement ([facility])  . Recurrent UTI   . Paroxysmal atrial fibrillation     cards d/c coumadin 12/2008 d/t persisten NSR and frequent falls- restarted coumadin july 2011, now on Eliquis  . Hyperthyroidism   . Dry skin   . Osteoarthritis   . Osteopenia     per dexa 12/09  . Keloid     @ chest  . Dizziness     Chronic, admiet 07-2010,saw neuro, thought to be a peripheral issue   . Anxiety 11/20/2011  . CVA (cerebral infarction) 08-2013    multiple, L, d/t Afib, started eloquis  . Memory loss     Current Outpatient Prescriptions on File Prior to Visit  Medication Sig Dispense Refill  . albuterol (PROVENTIL HFA;VENTOLIN HFA) 108 (90 BASE) MCG/ACT inhaler Inhale 2 puffs into the lungs every 6 (six) hours as needed for wheezing.      Marland Kitchen ALPRAZolam (XANAX) 0.5 MG tablet Take 0.5 mg by mouth at bedtime as needed for anxiety or sleep.      Marland Kitchen apixaban (ELIQUIS) 2.5 MG TABS tablet Take 1 tablet (2.5 mg total) by mouth 2 (two) times daily.  60 tablet  6  . aspirin 81 MG tablet Take 81 mg by mouth daily.      Marland Kitchen atorvastatin (LIPITOR) 40 MG tablet Take 40 mg by mouth daily at 6 PM.      . beclomethasone (QVAR) 80 MCG/ACT inhaler Inhale 2 puffs into the lungs 2 (two)  times daily.      . benazepril (LOTENSIN) 20 MG tablet TAKE 1 TABLET (20 MG TOTAL) BY MOUTH DAILY.  30 tablet  7  . Calcium Carbonate (CALCIUM 500 PO) Take 500 mg by mouth daily.      . cholecalciferol (VITAMIN D) 1000 UNITS tablet Take 1,000 Units by mouth daily.        Marland Kitchen diltiazem (CARDIZEM CD) 120 MG 24 hr capsule TAKE 1 CAPSULE (120 MG TOTAL) BY MOUTH DAILY.  30 capsule  7  . esomeprazole (NEXIUM) 40 MG capsule Take 1 capsule (40 mg total) by mouth daily before breakfast.  30 capsule  3  . furosemide (LASIX) 20 MG tablet Take 1 tablet (20 mg total) by mouth daily.  30 tablet  6  . JANUVIA 50 MG tablet TAKE 1 TABLET BY MOUTH EVERY DAY  30 tablet  4  . methimazole (TAPAZOLE) 5 MG tablet TAKE 1 TABLET (5 MG TOTAL) BY MOUTH EVERY MONDAY, WEDNESDAY, AND FRIDAY.  40 tablet  0  . metoprolol tartrate (LOPRESSOR) 25 MG tablet Take 25 mg by mouth 3 (three) times daily.      . nitroGLYCERIN (NITROSTAT) 0.4 MG SL tablet Place 0.4 mg under  the tongue every 5 (five) minutes as needed for chest pain.      . ONE TOUCH ULTRA TEST test strip TEST EVERY DAY AS DIRECTED  100 each  11  . ONETOUCH DELICA LANCETS 02D MISC TEST EVERY DAY AS DIRECTED  200 each  2  . QVAR 80 MCG/ACT inhaler INHALE 2 PUFFS INTO THE LUNGS TWICE DAILY  8.7 g  3  . TRAVATAN Z 0.004 % SOLN ophthalmic solution Place 1 drop into both eyes at bedtime.       . [DISCONTINUED] potassium chloride (KLOR-CON 10) 10 MEQ CR tablet Take 1 tablet (10 mEq total) by mouth daily.  10 tablet  0   No current facility-administered medications on file prior to visit.    Allergies  Allergen Reactions  . Hydrocodone     REACTION: itching/nausea  . Tramadol Hcl     REACTION: itching , upset stomach (08-2009)    Family History  Problem Relation Age of Onset  . Colon cancer Neg Hx   . Breast cancer Neg Hx   . Thyroid disease Neg Hx   . Coronary artery disease Son 81    deceased  . Heart attack Father   . Hypertension Brother   . Stroke Brother      History   Social History  . Marital Status: Divorced    Spouse Name: N/A    Number of Children: 18  . Years of Education: N/A   Occupational History  . retired     Social History Main Topics  . Smoking status: Former Research scientist (life sciences)  . Smokeless tobacco: Never Used     Comment: quit 2004, smoked 1.5 ppd  . Alcohol Use: No  . Drug Use: No  . Sexual Activity: None   Other Topics Concern  . None   Social History Narrative   Back living at her house since the last visit, GGson lives w/ her (78 y/o)   Had 3 daughter- 2 son  (lost oldest daughter and son), 2 living daughters in [place] - See HPI.  All other ROS are negative.  BP 179/75  Pulse 73  Temp(Src) 98.1 F (36.7 C) (Oral)  Resp 16  Ht '5\' 2"'  (1.575 m)  Wt 163 lb (73.936 kg)  BMI 29.81 kg/m2  SpO2 100%  Physical Exam  Vitals reviewed. Constitutional: She is well-developed, well-nourished, and in no distress.  HENT:  Head: Normocephalic and atraumatic.  Eyes: Conjunctivae are normal.  Cardiovascular: Normal rate, regular rhythm, normal heart sounds and intact distal pulses.   Pulmonary/Chest: Effort normal and breath sounds normal.  Abdominal: Soft. Bowel sounds are normal. She exhibits no distension and no mass. There is no tenderness. There is no rebound and no guarding.  Skin: Skin is warm and dry. No rash noted.  Psychiatric: Affect normal.    Recent Results (from the past 2160 hour(s))  TSH     Status: None   Collection Time    03/22/14  9:56 AM      Result Value Ref Range   TSH 0.35  0.35 - 4.50 uIU/mL  T4, FREE     Status: None   Collection Time    03/22/14  9:56 AM      Result Value Ref Range   Free T4 0.85  0.60 - 1.60 ng/dL  CBC WITH DIFFERENTIAL     Status: Abnormal   Collection Time    03/25/14  3:44 PM      Result Value  Ref Range   WBC 8.0  4.0 - 10.5 K/uL   RBC 3.53 (*) 3.87 - 5.11 MIL/uL   Hemoglobin 11.4 (*) 12.0 - 15.0 g/dL   HCT 35.2 (*) 36.0 - 46.0 %   MCV 99.7   78.0 - 100.0 fL   MCH 32.3  26.0 - 34.0 pg   MCHC 32.4  30.0 - 36.0 g/dL   RDW 12.9  11.5 - 15.5 %   Platelets 162  150 - 400 K/uL   Neutrophils Relative % 65  43 - 77 %   Neutro Abs 5.2  1.7 - 7.7 K/uL   Lymphocytes Relative 20  12 - 46 %   Lymphs Abs 1.6  0.7 - 4.0 K/uL   Monocytes Relative 13 (*) 3 - 12 %   Monocytes Absolute 1.0  0.1 - 1.0 K/uL   Eosinophils Relative 2  0 - 5 %   Eosinophils Absolute 0.2  0.0 - 0.7 K/uL   Basophils Relative 0  0 - 1 %   Basophils Absolute 0.0  0.0 - 0.1 K/uL  COMPREHENSIVE METABOLIC PANEL     Status: Abnormal   Collection Time    03/25/14  3:44 PM      Result Value Ref Range   Sodium 142  137 - 147 mEq/L   Potassium 4.2  3.7 - 5.3 mEq/L   Chloride 101  96 - 112 mEq/L   CO2 28  19 - 32 mEq/L   Glucose, Bld 100 (*) 70 - 99 mg/dL   BUN 18  6 - 23 mg/dL   Creatinine, Ser 1.00  0.50 - 1.10 mg/dL   Calcium 9.8  8.4 - 10.5 mg/dL   Total Protein 7.7  6.0 - 8.3 g/dL   Albumin 3.9  3.5 - 5.2 g/dL   AST 25  0 - 37 U/L   ALT 18  0 - 35 U/L   Alkaline Phosphatase 114  39 - 117 U/L   Total Bilirubin 0.4  0.3 - 1.2 mg/dL   GFR calc non Af Amer 48 (*) >90 mL/min   GFR calc Af Amer 56 (*) >90 mL/min   Comment: (NOTE)     The eGFR has been calculated using the CKD EPI equation.     This calculation has not been validated in all clinical situations.     eGFR's persistently <90 mL/min signify possible Chronic Kidney     Disease.   Anion gap 13  5 - 15  PROTIME-INR     Status: Abnormal   Collection Time    03/25/14  3:44 PM      Result Value Ref Range   Prothrombin Time 16.7 (*) 11.6 - 15.2 seconds   INR 1.35  0.00 - 1.49  OCCULT BLOOD X 1 CARD TO LAB, STOOL     Status: Abnormal   Collection Time    03/25/14  4:39 PM      Result Value Ref Range   Fecal Occult Bld POSITIVE (*) NEGATIVE    Assessment/Plan: Rectal bleeding Patient at increased risk of GI bleed due to chronic anticoagulation.  Is endorsing increased work of breathing and some mild  chest pain.  EKG reveals rate-controlled atrial fibrillation.  BP significantly elevated.  Patient triaged to ER for more acute workup and assessment.

## 2014-03-25 NOTE — Discharge Instructions (Signed)
Bloody Stools  Bloody stools often mean that there is a problem in the digestive tract. Your caregiver may use the term "melena" to describe black, tarry, and bad smelling stools or "hematochezia" to describe red or maroon-colored stools. Blood seen in the stool can be caused by bleeding anywhere along the intestinal tract.   A black stool usually means that blood is coming from the upper part of the gastrointestinal tract (esophagus, stomach, or small bowel). Passing maroon-colored stools or bright red blood usually means that blood is coming from lower down in the large bowel or the rectum. However, sometimes massive bleeding in the stomach or small intestine can cause bright red bloody stools.   Consuming black licorice, lead, iron pills, medicines containing bismuth subsalicylate, or blueberries can also cause black stools. Your caregiver can test black stools to see if blood is present.  It is important that the cause of the bleeding be found. Treatment can then be started, and the problem can be corrected. Rectal bleeding may not be serious, but you should not assume everything is okay until you know the cause. It is very important to follow up with your caregiver or a specialist in gastrointestinal problems.  CAUSES   Blood in the stools can come from various underlying causes. Often, the cause is not found during your first visit. Testing is often needed to discover the cause of bleeding in the gastrointestinal tract. Causes range from simple to serious or even life-threatening. Possible causes include:  · Hemorrhoids. These are veins that are full of blood (engorged) in the rectum. They cause pain, inflammation, and may bleed.  · Anal fissures. These are areas of painful tearing which may bleed. They are often caused by passing hard stool.  · Diverticulosis. These are pouches that form on the colon over time, with age, and may bleed significantly.  · Diverticulitis. This is inflammation in areas with  diverticulosis. It can cause pain, fever, and bloody stools, although bleeding is rare.  · Proctitis and colitis. These are inflamed areas of the rectum or colon. They may cause pain, fever, and bloody stools.  · Polyps and cancer. Colon cancer is a leading cause of preventable cancer death. It often starts out as precancerous polyps that can be removed during a colonoscopy, preventing progression into cancer. Sometimes, polyps and cancer may cause rectal bleeding.  · Gastritis and ulcers. Bleeding from the upper gastrointestinal tract (near the stomach) may travel through the intestines and produce black, sometimes tarry, often bad smelling stools. In certain cases, if the bleeding is fast enough, the stools may not be black, but red and the condition may be life-threatening.  SYMPTOMS   You may have stools that are bright red and bloody, that are normal color with blood on them, or that are dark black and tarry. In some cases, you may only have blood in the toilet bowl. Any of these cases need medical care. You may also have:  · Pain at the anus or anywhere in the rectum.  · Lightheadedness or feeling faint.  · Extreme weakness.  · Nausea or vomiting.  · Fever.  DIAGNOSIS  Your caregiver may use the following methods to find the cause of your bleeding:  · Taking a medical history. Age is important. Older people tend to develop polyps and cancer more often. If there is anal pain and a hard, large stool associated with bleeding, a tear of the anus may be the cause. If blood drips into the toilet after a bowel movement, bleeding hemorrhoids may be the   problem. The color and frequency of the bleeding are additional considerations. In most cases, the medical history provides clues, but seldom the final answer.  · A visual and finger (digital) exam. Your caregiver will inspect the anal area, looking for tears and hemorrhoids. A finger exam can provide information when there is tenderness or a growth inside. In men, the  prostate is also examined.  · Endoscopy. Several types of small, long scopes (endoscopes) are used to view the colon.  ¨ In the office, your caregiver may use a rigid, or more commonly, a flexible viewing sigmoidoscope. This exam is called flexible sigmoidoscopy. It is performed in 5 to 10 minutes.  ¨ A more thorough exam is accomplished with a colonoscope. It allows your caregiver to view the entire 5 to 6 foot long colon. Medicine to help you relax (sedative) is usually given for this exam. Frequently, a bleeding lesion may be present beyond the reach of the sigmoidoscope. So, a colonoscopy may be the best exam to start with. Both exams are usually done on an outpatient basis. This means the patient does not stay overnight in the hospital or surgery center.  ¨ An upper endoscopy may be needed to examine your stomach. Sedation is used and a flexible endoscope is put in your mouth, down to your stomach.  · A barium enema X-ray. This is an X-ray exam. It uses liquid barium inserted by enema into the rectum. This test alone may not identify an actual bleeding point. X-rays highlight abnormal shadows, such as those made by lumps (tumors), diverticuli, or colitis.  TREATMENT   Treatment depends on the cause of your bleeding.   · For bleeding from the stomach or colon, the caregiver doing your endoscopy or colonoscopy may be able to stop the bleeding as part of the procedure.  · Inflammation or infection of the colon can be treated with medicines.  · Many rectal problems can be treated with creams, suppositories, or warm baths.  · Surgery is sometimes needed.  · Blood transfusions are sometimes needed if you have lost a lot of blood.  · For any bleeding problem, let your caregiver know if you take aspirin or other blood thinners regularly.  HOME CARE INSTRUCTIONS   · Take any medicines exactly as prescribed.  · Keep your stools soft by eating a diet high in fiber. Prunes (1 to 3 a day) work well for many people.  · Drink  enough water and fluids to keep your urine clear or pale yellow.  · Take sitz baths if advised. A sitz bath is when you sit in a bathtub with warm water for 10 to 15 minutes to soak, soothe, and cleanse the rectal area.  · If enemas or suppositories are advised, be sure you know how to use them. Tell your caregiver if you have problems with this.  · Monitor your bowel movements to look for signs of improvement or worsening.  SEEK MEDICAL CARE IF:   · You do not improve in the time expected.  · Your condition worsens after initial improvement.  · You develop any new symptoms.  SEEK IMMEDIATE MEDICAL CARE IF:   · You develop severe or prolonged rectal bleeding.  · You vomit blood.  · You feel weak or faint.  · You have a fever.  MAKE SURE YOU:  · Understand these instructions.  · Will watch your condition.  · Will get help right away if you are not doing well or get worse.    Document Released: 05/17/2002 Document Revised: 08/19/2011 Document Reviewed: 10/12/2010  [employer]® Patient Information ©2015 [employer]. This information is not intended to replace advice given to you by your health care provider. Make sure you discuss any questions you have with your health care provider.

## 2014-03-25 NOTE — ED Notes (Signed)
Pt c/o dark red blood and rectal pain with BM x1 week.

## 2014-03-25 NOTE — ED Provider Notes (Signed)
CSN: [id number]     Arrival date & time 03/25/14  1503 History   First MD Initiated Contact with Patient 03/25/14 1558     Chief Complaint  Patient presents with  . Rectal Bleeding     (Consider location/radiation/quality/duration/timing/severity/associated sxs/prior Treatment) Patient is a 80 y.o. female presenting with hematochezia. The history is provided by the patient (family).  Rectal Bleeding Quality: dark red appearing. Amount: mild to mod. Duration:  1 week Timing:  Intermittent Progression since onset: unsure. Chronicity:  New Context: spontaneously   Similar prior episodes: no   Relieved by:  Nothing Worsened by:  Nothing tried Ineffective treatments:  None tried Associated symptoms: no abdominal pain, no dizziness, no fever and no vomiting   Risk factors: anticoagulant use     Past Medical History  Diagnosis Date  . Asthma   . Diabetes mellitus   . Hypertension   . Hyperlipidemia   . CAD (coronary artery disease)     s/p CABG s/p AO valve replacement ([facility] [facility])  . Recurrent UTI   . Paroxysmal atrial fibrillation     cards d/c coumadin 12/2008 d/t persisten NSR and frequent falls- restarted coumadin july 2011, now on Eliquis  . Hyperthyroidism   . Dry skin   . Osteoarthritis   . Osteopenia     per dexa 12/09  . Keloid     @ chest  . Dizziness     Chronic, admiet 07-2010,saw neuro, thought to be a peripheral issue   . Anxiety 11/20/2011  . CVA (cerebral infarction) 08-2013    multiple, L, d/t Afib, started eloquis  . Memory loss    Past Surgical History  Procedure Laterality Date  . Abdominal hysterectomy    . Total knee arthroplasty  1999  . Oophorectomy    . Cagb  1998  . Cesarean section      x 2  . Hemorrhoid surgery     Family History  Problem Relation Age of Onset  . Colon cancer Neg Hx   . Breast cancer Neg Hx   . Thyroid disease Neg Hx   . Coronary artery disease Son 38    deceased  . Heart attack Father   . Hypertension  Brother   . Stroke Brother    History  Substance Use Topics  . Smoking status: Former Research scientist (life sciences)  . Smokeless tobacco: Never Used     Comment: quit 2004, smoked 1.5 ppd  . Alcohol Use: No   OB History   Grav Para Term Preterm Abortions TAB SAB Ect Mult Living                 Review of Systems  Constitutional: Negative for fever and fatigue.  HENT: Negative for congestion and drooling.   Eyes: Negative for pain.  Respiratory: Positive for shortness of breath (chronic). Negative for cough.   Cardiovascular: Negative for chest pain.  Gastrointestinal: Positive for hematochezia. Negative for nausea, vomiting, abdominal pain and diarrhea.  Genitourinary: Negative for dysuria and hematuria.       Blood in stools  Musculoskeletal: Negative for back pain, gait problem and neck pain.  Skin: Negative for color change.  Neurological: Negative for dizziness and headaches.  Hematological: Negative for adenopathy.  Psychiatric/Behavioral: Negative for behavioral problems.  All other systems reviewed and are negative.     Allergies  Hydrocodone and Tramadol hcl  Home Medications   Prior to Admission medications   Medication Sig Start Date End Date Taking? Authorizing Provider  albuterol (  PROVENTIL HFA;VENTOLIN HFA) 108 (90 BASE) MCG/ACT inhaler Inhale 2 puffs into the lungs every 6 (six) hours as needed for wheezing. 03/26/13 03/26/14  Colon Branch, MD  ALPRAZolam Duanne Moron) 0.5 MG tablet Take 0.5 mg by mouth at bedtime as needed for anxiety or sleep.    Historical Provider, MD  apixaban (ELIQUIS) 2.5 MG TABS tablet Take 1 tablet (2.5 mg total) by mouth 2 (two) times daily. 09/08/13   Troy Sine, MD  aspirin 81 MG tablet Take 81 mg by mouth daily.    Historical Provider, MD  atorvastatin (LIPITOR) 40 MG tablet Take 40 mg by mouth daily at 6 PM.    Historical Provider, MD  beclomethasone (QVAR) 80 MCG/ACT inhaler Inhale 2 puffs into the lungs 2 (two) times daily. 07/13/13   Colon Branch, MD   benazepril (LOTENSIN) 20 MG tablet TAKE 1 TABLET (20 MG TOTAL) BY MOUTH DAILY. 03/07/14   Troy Sine, MD  Calcium Carbonate (CALCIUM 500 PO) Take 500 mg by mouth daily.    Historical Provider, MD  cholecalciferol (VITAMIN D) 1000 UNITS tablet Take 1,000 Units by mouth daily.      Historical Provider, MD  diltiazem (CARDIZEM CD) 120 MG 24 hr capsule TAKE 1 CAPSULE (120 MG TOTAL) BY MOUTH DAILY. 03/07/14   Troy Sine, MD  esomeprazole (NEXIUM) 40 MG capsule Take 1 capsule (40 mg total) by mouth daily before breakfast. 03/07/14   Colon Branch, MD  furosemide (LASIX) 20 MG tablet Take 1 tablet (20 mg total) by mouth daily. 09/15/13   Colon Branch, MD  JANUVIA 50 MG tablet TAKE 1 TABLET BY MOUTH EVERY DAY    Brunetta Jeans, PA-C  methimazole (TAPAZOLE) 5 MG tablet TAKE 1 TABLET (5 MG TOTAL) BY MOUTH EVERY MONDAY, WEDNESDAY, AND FRIDAY. 03/14/14   Renato Shin, MD  metoprolol tartrate (LOPRESSOR) 25 MG tablet Take 25 mg by mouth 3 (three) times daily.    Historical Provider, MD  nitroGLYCERIN (NITROSTAT) 0.4 MG SL tablet Place 0.4 mg under the tongue every 5 (five) minutes as needed for chest pain.    Historical Provider, MD  ONE TOUCH ULTRA TEST test strip TEST EVERY DAY AS DIRECTED 08/20/13   Colon Branch, MD  [facility] DELICA LANCETS 66A MISC TEST EVERY DAY AS DIRECTED 08/20/13   Colon Branch, MD  QVAR 80 MCG/ACT inhaler INHALE 2 PUFFS INTO THE LUNGS TWICE DAILY 02/22/14   Colon Branch, MD  TRAVATAN Z 0.004 % SOLN ophthalmic solution Place 1 drop into both eyes at bedtime.  06/16/13   Historical Provider, MD   BP 159/111  Pulse 64  Temp(Src) 97.9 F (36.6 C) (Oral)  Resp 20  SpO2 99% Physical Exam  Nursing note and vitals reviewed. Constitutional: She is oriented to person, place, and time. She appears well-developed and well-nourished.  HENT:  Head: Normocephalic and atraumatic.  Mouth/Throat: Oropharynx is clear and moist. No oropharyngeal exudate.  Eyes: Conjunctivae and EOM are normal. Pupils are  equal, round, and reactive to light.  Neck: Normal range of motion. Neck supple.  Cardiovascular: Normal rate, regular rhythm, normal heart sounds and intact distal pulses.  Exam reveals no gallop and no friction rub.   No murmur heard. Pulmonary/Chest: Effort normal and breath sounds normal. No respiratory distress. She has no wheezes.  Abdominal: Soft. Bowel sounds are normal. There is no tenderness. There is no rebound and no guarding.  Genitourinary:  Normal appearing external rectum with small noninflamed  and non-thrombosed external hemorrhoid. Otherwise normal palpation during rectal exam without pain.  Dark red stool was noted.  Musculoskeletal: Normal range of motion. She exhibits no edema and no tenderness.  Neurological: She is alert and oriented to person, place, and time.  Skin: Skin is warm and dry.  Psychiatric: She has a normal mood and affect. Her behavior is normal.    ED Course  Procedures (including critical care time) Labs Review Labs Reviewed  CBC WITH DIFFERENTIAL - Abnormal; Notable for the following:    RBC 3.53 (*)    Hemoglobin 11.4 (*)    HCT 35.2 (*)    Monocytes Relative 13 (*)    All other components within normal limits  COMPREHENSIVE METABOLIC PANEL - Abnormal; Notable for the following:    Glucose, Bld 100 (*)    GFR calc non Af Amer 48 (*)    GFR calc Af Amer 56 (*)    All other components within normal limits  PROTIME-INR - Abnormal; Notable for the following:    Prothrombin Time 16.7 (*)    All other components within normal limits    Imaging Review No results found.   EKG Interpretation None      MDM   Final diagnoses:  Rectal bleeding    4:33 PM 80 y.o. female w hx of DM, HTN, CAD, P-Afib on eliquis, CVA who presents with darker blood and some clots in her stool over the last week. She states that she only sees the blood intermittently. She has some chronic shortness of breath but denies any fatigue or weakness. She has  difficulty ambulating due to 2 previous strokes. Her vital signs are unremarkable here. She does complain of some mild hard stools and straining. She states that she did not have any pain during bowel movements. Will get screening labwork.  5:19 PM: I interpreted/reviewed the labs and/or imaging which were non-contributory.  Hemoglobin is stable. She did have dark red stool. The patient is currently asymptomatic. I had a long discussion with the family about her disposition. Given the fact that she is asymptomatic, her symptoms are intermittent, VSS and her labwork is otherwise unremarkable I felt it was reasonable for her to go home and follow up closely with [facility] on Monday. I also offered admission to the hospital given that the patient is elderly and on an anticoagulant. Given that she has had symptoms for a week it does appear that the bleeding is stable. The patient and family preferred to go home. I gave them strong precautions to return for any worsening.  I have discussed the diagnosis/risks/treatment options with the patient and family and believe the pt to be eligible for discharge home to follow-up with  gi on monday. We also discussed returning to the ED immediately if new or worsening sx occur. We discussed the sx which are most concerning (e.g., worsening bleeding, sob, fatigue, weakness) that necessitate immediate return. Medications administered to the patient during their visit and any new prescriptions provided to the patient are listed below.  Medications given during this visit Medications - No data to display  New Prescriptions   DOCUSATE SODIUM (COLACE) 100 MG CAPSULE    Take 1 capsule (100 mg total) by mouth daily.        Pamella Pert, MD 03/25/14 [phone]

## 2014-03-25 NOTE — Progress Notes (Signed)
Pre visit review using our clinic review tool, if applicable. No additional management support is needed unless otherwise documented below in the visit note/SLS  

## 2014-03-27 ENCOUNTER — Other Ambulatory Visit: Payer: Self-pay | Admitting: Cardiovascular Disease

## 2014-03-27 DIAGNOSIS — K625 Hemorrhage of anus and rectum: Secondary | ICD-10-CM | POA: Insufficient documentation

## 2014-03-27 NOTE — Assessment & Plan Note (Signed)
Patient at increased risk of GI bleed due to chronic anticoagulation.  Is endorsing increased work of breathing and some mild chest pain.  EKG reveals rate-controlled atrial fibrillation.  BP significantly elevated.  Patient triaged to ER for more acute workup and assessment.

## 2014-03-28 ENCOUNTER — Encounter (HOSPITAL_COMMUNITY): Payer: Self-pay | Admitting: Emergency Medicine

## 2014-03-28 ENCOUNTER — Inpatient Hospital Stay (HOSPITAL_COMMUNITY)
Admission: EM | Admit: 2014-03-28 | Discharge: 2014-04-01 | DRG: 330 | Disposition: A | Discharge status: Home Health | Payer: Medicare Other | Attending: Internal Medicine | Admitting: Internal Medicine

## 2014-03-28 ENCOUNTER — Inpatient Hospital Stay (HOSPITAL_COMMUNITY): Payer: Medicare Other

## 2014-03-28 ENCOUNTER — Emergency Department (HOSPITAL_COMMUNITY): Payer: Medicare Other

## 2014-03-28 DIAGNOSIS — K5521 Angiodysplasia of colon with hemorrhage: Secondary | ICD-10-CM | POA: Diagnosis present

## 2014-03-28 DIAGNOSIS — Z7901 Long term (current) use of anticoagulants: Secondary | ICD-10-CM | POA: Diagnosis not present

## 2014-03-28 DIAGNOSIS — R296 Repeated falls: Secondary | ICD-10-CM | POA: Diagnosis present

## 2014-03-28 DIAGNOSIS — E0590 Thyrotoxicosis, unspecified without thyrotoxic crisis or storm: Secondary | ICD-10-CM | POA: Diagnosis present

## 2014-03-28 DIAGNOSIS — Z7982 Long term (current) use of aspirin: Secondary | ICD-10-CM | POA: Diagnosis not present

## 2014-03-28 DIAGNOSIS — Z87440 Personal history of urinary (tract) infections: Secondary | ICD-10-CM

## 2014-03-28 DIAGNOSIS — I25810 Atherosclerosis of coronary artery bypass graft(s) without angina pectoris: Secondary | ICD-10-CM | POA: Diagnosis present

## 2014-03-28 DIAGNOSIS — Z952 Presence of prosthetic heart valve: Secondary | ICD-10-CM

## 2014-03-28 DIAGNOSIS — Z951 Presence of aortocoronary bypass graft: Secondary | ICD-10-CM

## 2014-03-28 DIAGNOSIS — I10 Essential (primary) hypertension: Secondary | ICD-10-CM | POA: Diagnosis present

## 2014-03-28 DIAGNOSIS — J45909 Unspecified asthma, uncomplicated: Secondary | ICD-10-CM | POA: Diagnosis present

## 2014-03-28 DIAGNOSIS — E119 Type 2 diabetes mellitus without complications: Secondary | ICD-10-CM | POA: Diagnosis present

## 2014-03-28 DIAGNOSIS — E1159 Type 2 diabetes mellitus with other circulatory complications: Secondary | ICD-10-CM | POA: Diagnosis present

## 2014-03-28 DIAGNOSIS — D62 Acute posthemorrhagic anemia: Secondary | ICD-10-CM | POA: Diagnosis present

## 2014-03-28 DIAGNOSIS — E785 Hyperlipidemia, unspecified: Secondary | ICD-10-CM | POA: Diagnosis present

## 2014-03-28 DIAGNOSIS — I4891 Unspecified atrial fibrillation: Secondary | ICD-10-CM | POA: Diagnosis present

## 2014-03-28 DIAGNOSIS — Z96659 Presence of unspecified artificial knee joint: Secondary | ICD-10-CM | POA: Diagnosis present

## 2014-03-28 DIAGNOSIS — R079 Chest pain, unspecified: Secondary | ICD-10-CM

## 2014-03-28 DIAGNOSIS — I998 Other disorder of circulatory system: Secondary | ICD-10-CM | POA: Diagnosis present

## 2014-03-28 DIAGNOSIS — Z87891 Personal history of nicotine dependence: Secondary | ICD-10-CM | POA: Diagnosis not present

## 2014-03-28 DIAGNOSIS — I2510 Atherosclerotic heart disease of native coronary artery without angina pectoris: Secondary | ICD-10-CM | POA: Diagnosis present

## 2014-03-28 DIAGNOSIS — I480 Paroxysmal atrial fibrillation: Secondary | ICD-10-CM | POA: Diagnosis present

## 2014-03-28 DIAGNOSIS — Z8673 Personal history of transient ischemic attack (TIA), and cerebral infarction without residual deficits: Secondary | ICD-10-CM | POA: Diagnosis not present

## 2014-03-28 DIAGNOSIS — K625 Hemorrhage of anus and rectum: Secondary | ICD-10-CM | POA: Diagnosis present

## 2014-03-28 DIAGNOSIS — R413 Other amnesia: Secondary | ICD-10-CM

## 2014-03-28 DIAGNOSIS — E0591 Thyrotoxicosis, unspecified with thyrotoxic crisis or storm: Secondary | ICD-10-CM

## 2014-03-28 DIAGNOSIS — I482 Chronic atrial fibrillation, unspecified: Secondary | ICD-10-CM

## 2014-03-28 DIAGNOSIS — I639 Cerebral infarction, unspecified: Secondary | ICD-10-CM

## 2014-03-28 DIAGNOSIS — K922 Gastrointestinal hemorrhage, unspecified: Secondary | ICD-10-CM

## 2014-03-28 DIAGNOSIS — J4520 Mild intermittent asthma, uncomplicated: Secondary | ICD-10-CM

## 2014-03-28 DIAGNOSIS — E782 Mixed hyperlipidemia: Secondary | ICD-10-CM | POA: Diagnosis present

## 2014-03-28 DIAGNOSIS — R5381 Other malaise: Secondary | ICD-10-CM

## 2014-03-28 LAB — POC OCCULT BLOOD, ED: FECAL OCCULT BLD: POSITIVE — AB

## 2014-03-28 LAB — PROTIME-INR
INR: 1.52 — AB (ref 0.00–1.49)
INR: 1.55 — ABNORMAL HIGH (ref 0.00–1.49)
PROTHROMBIN TIME: 18.4 s — AB (ref 11.6–15.2)
Prothrombin Time: 18.7 seconds — ABNORMAL HIGH (ref 11.6–15.2)

## 2014-03-28 LAB — CBC WITH DIFFERENTIAL/PLATELET
BASOS PCT: 1 % (ref 0–1)
Basophils Absolute: 0 10*3/uL (ref 0.0–0.1)
EOS ABS: 0.2 10*3/uL (ref 0.0–0.7)
Eosinophils Relative: 2 % (ref 0–5)
HCT: 29.4 % — ABNORMAL LOW (ref 36.0–46.0)
HEMOGLOBIN: 9.5 g/dL — AB (ref 12.0–15.0)
LYMPHS ABS: 1.7 10*3/uL (ref 0.7–4.0)
Lymphocytes Relative: 24 % (ref 12–46)
MCH: 32.3 pg (ref 26.0–34.0)
MCHC: 32.3 g/dL (ref 30.0–36.0)
MCV: 100 fL (ref 78.0–100.0)
Monocytes Absolute: 0.9 10*3/uL (ref 0.1–1.0)
Monocytes Relative: 13 % — ABNORMAL HIGH (ref 3–12)
NEUTROS ABS: 4.2 10*3/uL (ref 1.7–7.7)
NEUTROS PCT: 60 % (ref 43–77)
Platelets: 202 10*3/uL (ref 150–400)
RBC: 2.94 MIL/uL — AB (ref 3.87–5.11)
RDW: 13.7 % (ref 11.5–15.5)
WBC: 6.9 10*3/uL (ref 4.0–10.5)

## 2014-03-28 LAB — URINALYSIS, ROUTINE W REFLEX MICROSCOPIC
Bilirubin Urine: NEGATIVE
Glucose, UA: NEGATIVE mg/dL
Hgb urine dipstick: NEGATIVE
Ketones, ur: NEGATIVE mg/dL
Leukocytes, UA: NEGATIVE
NITRITE: NEGATIVE
PH: 6 (ref 5.0–8.0)
Protein, ur: NEGATIVE mg/dL
SPECIFIC GRAVITY, URINE: 1.011 (ref 1.005–1.030)
Urobilinogen, UA: 0.2 mg/dL (ref 0.0–1.0)

## 2014-03-28 LAB — COMPREHENSIVE METABOLIC PANEL
ALBUMIN: 3.3 g/dL — AB (ref 3.5–5.2)
ALT: 15 U/L (ref 0–35)
AST: 21 U/L (ref 0–37)
Alkaline Phosphatase: 93 U/L (ref 39–117)
Anion gap: 12 (ref 5–15)
BILIRUBIN TOTAL: 0.3 mg/dL (ref 0.3–1.2)
BUN: 19 mg/dL (ref 6–23)
CO2: 25 mEq/L (ref 19–32)
Calcium: 8.9 mg/dL (ref 8.4–10.5)
Chloride: 104 mEq/L (ref 96–112)
Creatinine, Ser: 1 mg/dL (ref 0.50–1.10)
GFR calc Af Amer: 56 mL/min — ABNORMAL LOW (ref >90)
GFR calc non Af Amer: 48 mL/min — ABNORMAL LOW (ref >90)
GLUCOSE: 93 mg/dL (ref 70–99)
POTASSIUM: 4.1 meq/L (ref 3.7–5.3)
SODIUM: 141 meq/L (ref 137–147)
TOTAL PROTEIN: 6.8 g/dL (ref 6.0–8.3)

## 2014-03-28 LAB — MAGNESIUM: MAGNESIUM: 1.8 mg/dL (ref 1.5–2.5)

## 2014-03-28 LAB — HEMOGLOBIN A1C
Hgb A1c MFr Bld: 6.1 % — ABNORMAL HIGH (ref <5.7)
Mean Plasma Glucose: 128 mg/dL — ABNORMAL HIGH (ref <117)

## 2014-03-28 LAB — I-STAT TROPONIN, ED: Troponin i, poc: 0.01 ng/mL (ref 0.00–0.08)

## 2014-03-28 LAB — APTT: aPTT: 39 seconds — ABNORMAL HIGH (ref 24–37)

## 2014-03-28 LAB — TROPONIN I: Troponin I: <0.3 ng/mL (ref <0.30)

## 2014-03-28 LAB — ABO/RH: ABO/RH(D): O POS

## 2014-03-28 LAB — PHOSPHORUS: Phosphorus: 3.7 mg/dL (ref 2.3–4.6)

## 2014-03-28 MED ORDER — ALPRAZOLAM 0.5 MG PO TABS: 0.5000 mg | ORAL_TABLET | Freq: Every evening | ORAL | Status: DC | PRN

## 2014-03-28 MED ORDER — FLUTICASONE PROPIONATE HFA 44 MCG/ACT IN AERO
2.0000 | INHALATION_SPRAY | Freq: Two times a day (BID) | RESPIRATORY_TRACT | Status: DC
  Administered 2014-03-28 – 2014-04-01 (×8): 2 via RESPIRATORY_TRACT
  Filled 2014-03-28: qty 10.6

## 2014-03-28 MED ORDER — LINAGLIPTIN 5 MG PO TABS
5.0000 mg | ORAL_TABLET | Freq: Every day | ORAL | Status: DC
  Administered 2014-03-29 – 2014-04-01 (×3): 5 mg via ORAL
  Filled 2014-03-28 (×4): qty 1

## 2014-03-28 MED ORDER — LATANOPROST 0.005 % OP SOLN
1.0000 [drp] | Freq: Every day | OPHTHALMIC | Status: DC
  Administered 2014-03-28 – 2014-03-31 (×4): 1 [drp] via OPHTHALMIC
  Filled 2014-03-28: qty 2.5

## 2014-03-28 MED ORDER — DOCUSATE SODIUM 100 MG PO CAPS
100.0000 mg | ORAL_CAPSULE | Freq: Every day | ORAL | Status: DC
  Administered 2014-03-29 – 2014-04-01 (×3): 100 mg via ORAL
  Filled 2014-03-28 (×4): qty 1

## 2014-03-28 MED ORDER — PANTOPRAZOLE SODIUM 40 MG IV SOLR
40.0000 mg | Freq: Two times a day (BID) | INTRAVENOUS | Status: DC
  Administered 2014-03-28 – 2014-03-31 (×5): 40 mg via INTRAVENOUS
  Filled 2014-03-28 (×7): qty 40

## 2014-03-28 MED ORDER — SODIUM CHLORIDE 0.9 % IJ SOLN
10.0000 mL | INTRAMUSCULAR | Status: DC | PRN
  Administered 2014-03-28 – 2014-04-01 (×3): 10 mL

## 2014-03-28 MED ORDER — ACETAMINOPHEN 650 MG RE SUPP: 650.0000 mg | Freq: Four times a day (QID) | RECTAL | Status: DC | PRN

## 2014-03-28 MED ORDER — ACETAMINOPHEN 325 MG PO TABS: 650.0000 mg | ORAL_TABLET | Freq: Four times a day (QID) | ORAL | Status: DC | PRN

## 2014-03-28 MED ORDER — SODIUM CHLORIDE 0.9 % IV SOLN
INTRAVENOUS | Status: AC
  Administered 2014-03-28 – 2014-03-29 (×2): via INTRAVENOUS

## 2014-03-28 MED ORDER — SODIUM CHLORIDE 0.9 % IJ SOLN
3.0000 mL | Freq: Two times a day (BID) | INTRAMUSCULAR | Status: DC
  Administered 2014-03-31: 3 mL via INTRAVENOUS

## 2014-03-28 MED ORDER — BECLOMETHASONE DIPROPIONATE 80 MCG/ACT IN AERS
2.0000 | INHALATION_SPRAY | Freq: Two times a day (BID) | RESPIRATORY_TRACT | Status: DC
  Filled 2014-03-28: qty 8.7

## 2014-03-28 MED ORDER — ALBUTEROL SULFATE (2.5 MG/3ML) 0.083% IN NEBU
3.0000 mL | INHALATION_SOLUTION | Freq: Four times a day (QID) | RESPIRATORY_TRACT | Status: DC | PRN
  Administered 2014-03-30: 3 mL via RESPIRATORY_TRACT
  Filled 2014-03-28: qty 3

## 2014-03-28 MED ORDER — ONDANSETRON HCL 4 MG/2ML IJ SOLN: 4.0000 mg | Freq: Four times a day (QID) | INTRAMUSCULAR | Status: DC | PRN

## 2014-03-28 MED ORDER — VITAMIN D3 25 MCG (1000 UNIT) PO TABS
1000.0000 [IU] | ORAL_TABLET | Freq: Every day | ORAL | Status: DC
  Administered 2014-03-29 – 2014-04-01 (×3): 1000 [IU] via ORAL
  Filled 2014-03-28 (×4): qty 1

## 2014-03-28 MED ORDER — METHIMAZOLE 5 MG PO TABS
5.0000 mg | ORAL_TABLET | Freq: Every day | ORAL | Status: DC
  Administered 2014-03-29 – 2014-04-01 (×3): 5 mg via ORAL
  Filled 2014-03-28 (×4): qty 1

## 2014-03-28 MED ORDER — METOPROLOL TARTRATE 25 MG PO TABS
25.0000 mg | ORAL_TABLET | Freq: Three times a day (TID) | ORAL | Status: DC
  Administered 2014-03-28 – 2014-04-01 (×10): 25 mg via ORAL
  Filled 2014-03-28 (×10): qty 1

## 2014-03-28 MED ORDER — ATORVASTATIN CALCIUM 40 MG PO TABS
40.0000 mg | ORAL_TABLET | Freq: Every day | ORAL | Status: DC
  Administered 2014-03-28 – 2014-03-31 (×4): 40 mg via ORAL
  Filled 2014-03-28 (×5): qty 1

## 2014-03-28 MED ORDER — DILTIAZEM HCL ER 120 MG PO CP24
120.0000 mg | ORAL_CAPSULE | Freq: Two times a day (BID) | ORAL | Status: DC
  Administered 2014-03-29 – 2014-04-01 (×6): 120 mg via ORAL
  Filled 2014-03-28 (×8): qty 1

## 2014-03-28 MED ORDER — ONDANSETRON HCL 4 MG PO TABS: 4.0000 mg | ORAL_TABLET | Freq: Four times a day (QID) | ORAL | Status: DC | PRN

## 2014-03-28 NOTE — H&P (Addendum)
[facility] History and Physical  Rebecca Skinner WJX:[id number] DOB: Oct 08, 1922 DOA: 03/28/2014  Referring physician: ER physician PCP: Kathlene November, MD   Chief Complaint: rectal bleed  HPI:  80 year old female with past medical history of CAD status post CABG, atrial fibrillation, status post AV replacement, on eliquis and aspirin, thyrotoxicosis, hypertension, dyslipidemia, diabetes who presented to [facility] ED 03/28/2014 with complaints of rectal bleed ongoing for past week or so. Patient reported painless bleed, bright red and dark red color with or without the bowel movement. No reports of abdominal pain nausea or vomiting. No fevers or chills. Patient was seen in urgent care few days ago for similar problem but decided to go home instead of being admitted for further work up. This morning she had an episode of chest pain in retrosternal area, non radiating, about 5/10 in intensity, sharp but has resolved spontaneously. No shortness of breath or palpitations. She had associated dizziness but no loss of consciousness or fall.  In ED, vitals are stable. BP 118/51, HR 56-80, T max 98.1 F and oxygen saturation 92-100%. Hemoglobin was 9.5 (drop noted since few days ago 03/25/2014 11.4. Evidence og bleed on exam. GI will see the patient in consultation for possible colonoscopy.   Assessment & Plan    Principal Problem: Probable Lower GI bleed / Acute blood loss anemia / Long term current use of anticoagulant therapy  Likely exacerbated with eliquis and aspirin which both are on hold.  Appreciate GI evaluation and their recommendations.  Started protonix 40 mg IV Q 12 hours. Hemoglobin is 9.5 on this admission. Will transfuse if hemoglobin drops less than 8.   BP stable.   Clear liquid diet now and NPO post midnight for possible colonoscopy in am.  Active Problems: Thyrotoxicosis  Check TSH  Resume methimazole  Rate controlled with Cardizem and metoprolol.  Diabetes mellitus with  circulatory complication  Check O1H  Resume home meds Dyslipidemia  Resume statin therapy  Essential hypertension  Resumed Cardizem and metoprolol.  CAD (coronary artery disease) of artery bypass graft / ATRIAL FIBRILLATION, PAROXYSMAL / chest pain  No complaints of chest pain at this time. She did have chest pain earlier today. Cycle cardiac enzymes.  Last 2 D ECHO in 2014 with EF of 55%.  Eliquis and aspirin on hold due to bleed. Intrinsic asthma  Resume home inhalers.    DVT prophylaxis:   SCD's bilaterally due to risk of bleeding.  Radiological Exams on Admission:  Dg Chest Portable 1 View 03/28/2014   Cardiomegaly without pulmonary edema.  Nodular density in the right parahilar region is probably related to vascular anatomy. Followup short-term interval dedicated PA and lateral chest x-ray, after resolution of acute symptoms, is recommended to re-evaluate.      EKG: atrial fibrillation.   Code Status: Full Family Communication: Family not at the bedside  Disposition Plan: Admit for further evaluation  Leisa Lenz, MD  [facility] Pager [phone]  Review of Systems:  Constitutional: Negative for fever, chills and malaise/fatigue. Negative for diaphoresis.  HENT: Negative for hearing loss, ear pain, nosebleeds, congestion, sore throat, neck pain, tinnitus and ear discharge.   Eyes: Negative for blurred vision, double vision, photophobia, pain, discharge and redness.  Respiratory: Negative for cough, hemoptysis, sputum production, shortness of breath, wheezing and stridor.   Cardiovascular: Negative for chest pain, palpitations, orthopnea, claudication and leg swelling.  Gastrointestinal: Negative for nausea, vomiting and abdominal pain. Negative for heartburn, constipation Genitourinary: Negative for dysuria, urgency, frequency, hematuria  and flank pain.  Musculoskeletal: Negative for myalgias, back pain, joint pain and falls.  Skin: Negative for itching  and rash.  Neurological: Negative for dizziness and weakness. Negative for tingling, tremors, sensory change, speech change, focal weakness, loss of consciousness and headaches.  Endo/Heme/Allergies: Negative for environmental allergies and polydipsia. Does not bruise/bleed easily.  Psychiatric/Behavioral: Negative for suicidal ideas. The patient is not nervous/anxious.      Past Medical History  Diagnosis Date  . Asthma   . Diabetes mellitus   . Hypertension   . Hyperlipidemia   . CAD (coronary artery disease)     s/p CABG s/p AO valve replacement ([place] CV)  . Recurrent UTI   . Paroxysmal atrial fibrillation     cards d/c coumadin 12/2008 d/t persisten NSR and frequent falls- restarted coumadin july 2011, now on Eliquis  . Hyperthyroidism   . Dry skin   . Osteoarthritis   . Osteopenia     per dexa 12/09  . Keloid     @ chest  . Dizziness     Chronic, admiet 07-2010,saw neuro, thought to be a peripheral issue   . Anxiety 11/20/2011  . CVA (cerebral infarction) 08-2013    multiple, L, d/t Afib, started eloquis  . Memory loss    Past Surgical History  Procedure Laterality Date  . Abdominal hysterectomy    . Total knee arthroplasty  1999  . Oophorectomy    . Cagb  1998  . Cesarean section      x 2  . Hemorrhoid surgery     Social History:  reports that she has quit smoking. She has never used smokeless tobacco. She reports that she does not drink alcohol or use illicit drugs.  Allergies  Allergen Reactions  . Hydrocodone     REACTION: itching/nausea  . Tramadol Hcl     REACTION: itching , upset stomach (08-2009)    Family History:  Family History  Problem Relation Age of Onset  . Colon cancer Neg Hx   . Breast cancer Neg Hx   . Thyroid disease Neg Hx   . Coronary artery disease Son 97    deceased  . Heart attack Father   . Hypertension Brother   . Stroke Brother      Prior to Admission medications   Medication Sig Start Date End Date Taking?  Authorizing Provider  acetaminophen (TYLENOL) 500 MG tablet Take 1,000 mg by mouth every 6 (six) hours as needed for moderate pain (pain).   Yes Historical Provider, MD  albuterol (PROVENTIL HFA;VENTOLIN HFA) 108 (90 BASE) MCG/ACT inhaler Inhale 2 puffs into the lungs every 6 (six) hours as needed for wheezing or shortness of breath (wheezing).   Yes Historical Provider, MD  ALPRAZolam Duanne Moron) 0.5 MG tablet Take 0.5 mg by mouth at bedtime as needed for anxiety or sleep (insomnia).    Yes Historical Provider, MD  apixaban (ELIQUIS) 2.5 MG TABS tablet Take 1 tablet (2.5 mg total) by mouth 2 (two) times daily. 09/08/13  Yes Troy Sine, MD  aspirin 81 MG tablet Take 81 mg by mouth daily.   Yes Historical Provider, MD  atorvastatin (LIPITOR) 40 MG tablet Take 40 mg by mouth daily at 6 PM.   Yes Historical Provider, MD  beclomethasone (QVAR) 80 MCG/ACT inhaler Inhale 2 puffs into the lungs 2 (two) times daily.   Yes Historical Provider, MD  benazepril (LOTENSIN) 20 MG tablet Take 20 mg by mouth daily.   Yes Historical Provider, MD  Calcium Carbonate (CALCIUM 500 PO) Take 500 mg by mouth at bedtime.    Yes Historical Provider, MD  cholecalciferol (VITAMIN D) 1000 UNITS tablet Take 1,000 Units by mouth daily.     Yes Historical Provider, MD  diltiazem (DILACOR XR) 120 MG 24 hr capsule Take 120 mg by mouth 2 (two) times daily.   Yes Historical Provider, MD  docusate sodium (COLACE) 100 MG capsule Take 1 capsule (100 mg total) by mouth daily. 03/25/14  Yes Pamella Pert, MD  esomeprazole (NEXIUM) 40 MG capsule Take 1 capsule (40 mg total) by mouth daily before breakfast. 03/07/14  Yes Colon Branch, MD  furosemide (LASIX) 20 MG tablet Take 1 tablet (20 mg total) by mouth daily. 09/15/13  Yes Colon Branch, MD  glucose blood test strip 1 each by Other route as needed for other (blood sugar test). Use as instructed   Yes Historical Provider, MD  methimazole (TAPAZOLE) 5 MG tablet Take 5 mg by mouth daily.   Yes  Historical Provider, MD  metoprolol tartrate (LOPRESSOR) 25 MG tablet Take 25 mg by mouth 3 (three) times daily.   Yes Historical Provider, MD  nitroGLYCERIN (NITROSTAT) 0.4 MG SL tablet Place 0.4 mg under the tongue every 5 (five) minutes as needed for chest pain (chest pain).    Yes Historical Provider, MD  sitaGLIPtin (JANUVIA) 50 MG tablet Take 50 mg by mouth daily.   Yes Historical Provider, MD  TRAVATAN Z 0.004 % SOLN ophthalmic solution Place 1 drop into both eyes at bedtime.  06/16/13  Yes Historical Provider, MD   Physical Exam: Filed Vitals:   03/28/14 1348 03/28/14 1400 03/28/14 1430 03/28/14 1525  BP: 154/53 144/66 122/65 118/51  Pulse: 71 68 56 72  Temp:    97.3 F (36.3 C)  TempSrc:    Oral  Resp: 20 21 12 18   Height:    5\' 2"  (1.575 m)  Weight:    73.3 kg (161 lb 9.6 oz)  SpO2: 100% 99% 92% 100%    Physical Exam  Constitutional: Appears well-developed and well-nourished. No distress.  HENT: Normocephalic. No tonsillar erythema or exudates Eyes: Conjunctivae and EOM are normal. PERRLA, no scleral icterus.  Neck: Normal ROM. Neck supple. No JVD. No tracheal deviation. No thyromegaly.  CVS: irregular rhythm, S1/S2 appreciated  Pulmonary: Effort and breath sounds normal, no stridor, rhonchi, wheezes, rales.  Abdominal: Soft. BS +,  no distension, tenderness, rebound or guarding.  Musculoskeletal: Normal range of motion. No edema and no tenderness.  Lymphadenopathy: No lymphadenopathy noted, cervical, inguinal. Neuro: Alert. Normal reflexes, muscle tone coordination. No focal neurologic deficits. Skin: Skin is warm and dry. No rash noted. Not diaphoretic. No erythema. No pallor.  Psychiatric: Normal mood and affect. Behavior, judgment, thought content normal.   Labs on Admission:  Basic Metabolic Panel:  Recent Labs Lab 03/25/14 1544 03/28/14 1340  NA 142 141  K 4.2 4.1  CL 101 104  CO2 28 25  GLUCOSE 100* 93  BUN 18 19  CREATININE 1.00 1.00  CALCIUM 9.8 8.9    Liver Function Tests:  Recent Labs Lab 03/25/14 1544 03/28/14 1340  AST 25 21  ALT 18 15  ALKPHOS 114 93  BILITOT 0.4 0.3  PROT 7.7 6.8  ALBUMIN 3.9 3.3*   No results found for this basename: LIPASE, AMYLASE,  in the last 168 hours No results found for this basename: AMMONIA,  in the last 168 hours CBC:  Recent Labs Lab 03/25/14 1544 03/28/14 1340  WBC 8.0 6.9  NEUTROABS 5.2 4.2  HGB 11.4* 9.5*  HCT 35.2* 29.4*  MCV 99.7 100.0  PLT 162 202   Cardiac Enzymes: No results found for this basename: CKTOTAL, CKMB, CKMBINDEX, TROPONINI,  in the last 168 hours BNP: No components found with this basename: POCBNP,  CBG: No results found for this basename: GLUCAP,  in the last 168 hours  If 7PM-7AM, please contact night-coverage [email or web address] Password [facility] 03/28/2014, 3:33 PM

## 2014-03-28 NOTE — ED Notes (Signed)
Pt reports she has had intermittent rectal bleeding for past 2 weeks. Has been having clots. Reports a "little bit" of weakness and dizziness. Pt reports this has not happened before. Pt on eliquis blood thinner for a fib.

## 2014-03-28 NOTE — ED Notes (Signed)
Report called to [facility] [place] Accepting nurse aware that PIV access has not been able to be obtained All questions answered by this nurse

## 2014-03-28 NOTE — ED Notes (Signed)
Pt reports she started to have chest pain yesterday in epigastric area. No emesis.

## 2014-03-28 NOTE — ED Notes (Signed)
Pt called with no response. Tech checked the lobby bathroom as well.

## 2014-03-28 NOTE — Consult Note (Signed)
Referring Provider:  Dr. Dina Rich (ED physician) Primary Care Physician:  Kathlene November, MD Primary Gastroenterologist:  [facility]  Reason for Consultation:  GI bleeding  HPI: Rebecca Skinner is a 80 y.o. female with PMH of asthma, DM, HTN, HLD, CAD s/p CABG and aortic valve replacement, PAF on Eliquis, hyperthyroidism, CVA, and anxiety.  She presents to [facility] hospital on 10/19 with complaints of rectal bleeding.  This started about 10 or so days ago and has been present almost every day since that time.  Had two episodes yesterday and then two this morning.  None since coming to the ED.  Described as dark red blood with clots and bright red colored blood as well.  Was at [facility] or med-center on 10/16 for the bleeding at which time Hgb was relatively stable so she was given the option for inpatient admission or outpatient follow-up; she chose outpatient follow-up.  Due to persistent bleeding and some dizziness and chest pain this AM she came to [facility] ED.  Hgb is down 2 more grams since 3 days ago (9.5 grams today).  She is hemodynamically stable.  EKG and troponins ok.  Is being admitted to hospitalist service.  She denies any bleeding similar to this in the past.  Denies abdominal pain, nausea, vomiting, NSAID use.  Says that her appetite has not been good; says taht she's lost 11 pounds since July or so.  Last colonoscopy ? 2007 by Dr. Allyn Kenner so we are trying to get those records.  INR 1.52.   Past Medical History  Diagnosis Date  . Asthma   . Diabetes mellitus   . Hypertension   . Hyperlipidemia   . CAD (coronary artery disease)     s/p CABG s/p AO valve replacement ([place] CV)  . Recurrent UTI   . Paroxysmal atrial fibrillation     cards d/c coumadin 12/2008 d/t persisten NSR and frequent falls- restarted coumadin july 2011, now on Eliquis  . Hyperthyroidism   . Dry skin   . Osteoarthritis   . Osteopenia     per dexa 12/09  . Keloid     @ chest  . Dizziness     Chronic,  admiet 07-2010,saw neuro, thought to be a peripheral issue   . Anxiety 11/20/2011  . CVA (cerebral infarction) 08-2013    multiple, L, d/t Afib, started eloquis  . Memory loss     Past Surgical History  Procedure Laterality Date  . Abdominal hysterectomy    . Total knee arthroplasty  1999  . Oophorectomy    . Cagb  1998  . Cesarean section      x 2  . Hemorrhoid surgery      Prior to Admission medications   Medication Sig Start Date End Date Taking? Authorizing Provider  acetaminophen (TYLENOL) 500 MG tablet Take 1,000 mg by mouth every 6 (six) hours as needed for moderate pain (pain).   Yes Historical Provider, MD  albuterol (PROVENTIL HFA;VENTOLIN HFA) 108 (90 BASE) MCG/ACT inhaler Inhale 2 puffs into the lungs every 6 (six) hours as needed for wheezing or shortness of breath (wheezing).   Yes Historical Provider, MD  ALPRAZolam Duanne Moron) 0.5 MG tablet Take 0.5 mg by mouth at bedtime as needed for anxiety or sleep (insomnia).    Yes Historical Provider, MD  apixaban (ELIQUIS) 2.5 MG TABS tablet Take 1 tablet (2.5 mg total) by mouth 2 (two) times daily. 09/08/13  Yes Troy Sine, MD  aspirin 81  MG tablet Take 81 mg by mouth daily.   Yes Historical Provider, MD  atorvastatin (LIPITOR) 40 MG tablet Take 40 mg by mouth daily at 6 PM.   Yes Historical Provider, MD  beclomethasone (QVAR) 80 MCG/ACT inhaler Inhale 2 puffs into the lungs 2 (two) times daily.   Yes Historical Provider, MD  benazepril (LOTENSIN) 20 MG tablet Take 20 mg by mouth daily.   Yes Historical Provider, MD  Calcium Carbonate (CALCIUM 500 PO) Take 500 mg by mouth at bedtime.    Yes Historical Provider, MD  cholecalciferol (VITAMIN D) 1000 UNITS tablet Take 1,000 Units by mouth daily.     Yes Historical Provider, MD  diltiazem (DILACOR XR) 120 MG 24 hr capsule Take 120 mg by mouth 2 (two) times daily.   Yes Historical Provider, MD  docusate sodium (COLACE) 100 MG capsule Take 1 capsule (100 mg total) by mouth daily. 03/25/14   Yes Pamella Pert, MD  esomeprazole (NEXIUM) 40 MG capsule Take 1 capsule (40 mg total) by mouth daily before breakfast. 03/07/14  Yes Colon Branch, MD  furosemide (LASIX) 20 MG tablet Take 1 tablet (20 mg total) by mouth daily. 09/15/13  Yes Colon Branch, MD  glucose blood test strip 1 each by Other route as needed for other (blood sugar test). Use as instructed   Yes Historical Provider, MD  methimazole (TAPAZOLE) 5 MG tablet Take 5 mg by mouth daily.   Yes Historical Provider, MD  metoprolol tartrate (LOPRESSOR) 25 MG tablet Take 25 mg by mouth 3 (three) times daily.   Yes Historical Provider, MD  nitroGLYCERIN (NITROSTAT) 0.4 MG SL tablet Place 0.4 mg under the tongue every 5 (five) minutes as needed for chest pain (chest pain).    Yes Historical Provider, MD  sitaGLIPtin (JANUVIA) 50 MG tablet Take 50 mg by mouth daily.   Yes Historical Provider, MD  TRAVATAN Z 0.004 % SOLN ophthalmic solution Place 1 drop into both eyes at bedtime.  06/16/13  Yes Historical Provider, MD    No current facility-administered medications for this encounter.   Current Outpatient Prescriptions  Medication Sig Dispense Refill  . acetaminophen (TYLENOL) 500 MG tablet Take 1,000 mg by mouth every 6 (six) hours as needed for moderate pain (pain).      Marland Kitchen albuterol (PROVENTIL HFA;VENTOLIN HFA) 108 (90 BASE) MCG/ACT inhaler Inhale 2 puffs into the lungs every 6 (six) hours as needed for wheezing or shortness of breath (wheezing).      . ALPRAZolam (XANAX) 0.5 MG tablet Take 0.5 mg by mouth at bedtime as needed for anxiety or sleep (insomnia).       Marland Kitchen apixaban (ELIQUIS) 2.5 MG TABS tablet Take 1 tablet (2.5 mg total) by mouth 2 (two) times daily.  60 tablet  6  . aspirin 81 MG tablet Take 81 mg by mouth daily.      Marland Kitchen atorvastatin (LIPITOR) 40 MG tablet Take 40 mg by mouth daily at 6 PM.      . beclomethasone (QVAR) 80 MCG/ACT inhaler Inhale 2 puffs into the lungs 2 (two) times daily.      . benazepril (LOTENSIN) 20 MG  tablet Take 20 mg by mouth daily.      . Calcium Carbonate (CALCIUM 500 PO) Take 500 mg by mouth at bedtime.       . cholecalciferol (VITAMIN D) 1000 UNITS tablet Take 1,000 Units by mouth daily.        Marland Kitchen diltiazem (DILACOR XR) 120 MG 24  hr capsule Take 120 mg by mouth 2 (two) times daily.      Marland Kitchen docusate sodium (COLACE) 100 MG capsule Take 1 capsule (100 mg total) by mouth daily.  30 capsule  0  . esomeprazole (NEXIUM) 40 MG capsule Take 1 capsule (40 mg total) by mouth daily before breakfast.  30 capsule  3  . furosemide (LASIX) 20 MG tablet Take 1 tablet (20 mg total) by mouth daily.  30 tablet  6  . glucose blood test strip 1 each by Other route as needed for other (blood sugar test). Use as instructed      . methimazole (TAPAZOLE) 5 MG tablet Take 5 mg by mouth daily.      . metoprolol tartrate (LOPRESSOR) 25 MG tablet Take 25 mg by mouth 3 (three) times daily.      . nitroGLYCERIN (NITROSTAT) 0.4 MG SL tablet Place 0.4 mg under the tongue every 5 (five) minutes as needed for chest pain (chest pain).       Marland Kitchen sitaGLIPtin (JANUVIA) 50 MG tablet Take 50 mg by mouth daily.      . TRAVATAN Z 0.004 % SOLN ophthalmic solution Place 1 drop into both eyes at bedtime.       . [DISCONTINUED] potassium chloride (KLOR-CON 10) 10 MEQ CR tablet Take 1 tablet (10 mEq total) by mouth daily.  10 tablet  0    Allergies as of 03/28/2014 - Review Complete 03/28/2014  Allergen Reaction Noted  . Hydrocodone  11/16/2008  . Tramadol hcl  12/18/2009    Family History  Problem Relation Age of Onset  . Colon cancer Neg Hx   . Breast cancer Neg Hx   . Thyroid disease Neg Hx   . Coronary artery disease Son 22    deceased  . Heart attack Father   . Hypertension Brother   . Stroke Brother     History   Social History  . Marital Status: Divorced    Spouse Name: N/A    Number of Children: 49  . Years of Education: N/A   Occupational History  . retired     Social History Main Topics  . Smoking status:  Former Research scientist (life sciences)  . Smokeless tobacco: Never Used     Comment: quit 2004, smoked 1.5 ppd  . Alcohol Use: No  . Drug Use: No  . Sexual Activity: Not on file   Other Topics Concern  . Not on file   Social History Narrative   Back living at her house since the last visit, GGson lives w/ her (78 y/o)   Had 3 daughter- 2 son  (lost oldest daughter and son), 2 living daughters in [place]: Ten point ROS is O/W negative except as mentioned in HPI.  Physical Exam: Vital signs in last 24 hours: Temp:  [98.1 F (36.7 C)] 98.1 F (36.7 C) (10/19 1230) Pulse Rate:  [68-80] 68 (10/19 1400) Resp:  [16-21] 21 (10/19 1400) BP: (128-154)/(53-70) 144/66 mmHg (10/19 1400) SpO2:  [99 %-100 %] 99 % (10/19 1400)   General:  Alert, Well-developed, well-nourished, pleasant and cooperative in NAD Head:  Normocephalic and atraumatic. Eyes:  Sclera clear, no icterus.  Conjunctiva pink. Ears:  Normal auditory acuity. Mouth:  No deformity or lesions.   Lungs:  Clear throughout to auscultation.  No wheezes, crackles, or rhonchi.  Heart:  Regular rate and rhythm; no murmurs, clicks, rubs, or gallops. Abdomen:  Soft, non-distended.  BS present.  Non-tender. Rectal:  Dark red blood noted on exam glove.  Msk:  Symmetrical without gross deformities. Pulses:  Normal pulses noted. Extremities:  Without clubbing or edema. Neurologic:  Alert and  oriented x4;  grossly normal neurologically. Skin:  Intact without significant lesions or rashes. Psych:  Alert and cooperative. Normal mood and affect.  Lab Results:  Recent Labs  03/25/14 1544 03/28/14 1340  WBC 8.0 6.9  HGB 11.4* 9.5*  HCT 35.2* 29.4*  PLT 162 202   BMET  Recent Labs  03/25/14 1544 03/28/14 1340  NA 142 141  K 4.2 4.1  CL 101 104  CO2 28 25  GLUCOSE 100* 93  BUN 18 19  CREATININE 1.00 1.00  CALCIUM 9.8 8.9   LFT  Recent Labs  03/28/14 1340  PROT 6.8  ALBUMIN 3.3*  AST 21  ALT 15  ALKPHOS 93    BILITOT 0.3   PT/INR  Recent Labs  03/25/14 1544  LABPROT 16.7*  INR 1.35   Studies/Results: Dg Chest Portable 1 View  03/28/2014   CLINICAL DATA:  Initial encounter for shortness of breath  EXAM: PORTABLE CHEST - 1 VIEW  COMPARISON:  08/27/2013.  FINDINGS: 1337 hrs. The cardio pericardial silhouette is enlarged. No edema or focal airspace consolidation. There is slight nodularity to the central vascular anatomy of the left mid lung, probably projectional. No pneumothorax or pleural effusion. Imaged bony structures of the thorax are intact. Telemetry leads overlie the chest.  IMPRESSION: Cardiomegaly without pulmonary edema.  Nodular density in the right parahilar region is probably related to vascular anatomy. Followup short-term interval dedicated PA and lateral chest x-ray, after resolution of acute symptoms, is recommended to re-evaluate.   Electronically Signed   By: Misty Stanley M.D.   On: 03/28/2014 13:59    IMPRESSION:  -GI bleeding:  Suspect lower source.  Possible diverticular bleed vs AVM vs malignancy. -Acute blood loss anemia:  Hgb down 2 grams from 3 days ago. -PAF on Eliquis -History of CVA's -CAD s/p CABG  PLAN: -Monitor Hgb.  Transfuse if needed. -Hold Eliquis for now. -Will defer to Dr. Henrene Pastor regarding colonoscopic evaluation.  Will need wash out of Eliquis, which is 24-48 hours. -Ok for clear liquids for now. -We are attempting to get records from Dr. Liliane Channel office regarding her last colonoscopy.   ZEHR, JESSICA D.  03/28/2014, 2:27 PM  Pager number [phone]  GI ATTENDING  History,labs,recoeds reviewed. Agree with H&P as outlined above by advanced provider. Elderly female with multiple significant medical problems who presents with chronic, painless, rectal bleeding in face of anticoagulation. Has dropped hg 2 grams from baseline, but hemodynamically stable. Agree with holding Eliquis and transfuse prn. Will need colonoscopy after Eliquis washout. Old  colon report being requested.  Docia Chuck. Geri Seminole., M.D. [facility] Division of Gastroenterology

## 2014-03-28 NOTE — Telephone Encounter (Signed)
Diltiazem refilled #30 capsules with 7 refills on 03/07/2014

## 2014-03-28 NOTE — Progress Notes (Signed)
Peripherally Inserted Central Catheter/Midline Placement  The IV Nurse has discussed with the patient and/or persons authorized to consent for the patient, the purpose of this procedure and the potential benefits and risks involved with this procedure.  The benefits include less needle sticks, lab draws from the catheter and patient may be discharged home with the catheter.  Risks include, but not limited to, infection, bleeding, blood clot (thrombus formation), and puncture of an artery; nerve damage and irregular heat beat.  Alternatives to this procedure were also discussed.  PICC/Midline Placement Documentation  PICC / Midline Single Lumen 61/60/73 PICC Right Basilic 37 cm 0 cm (Active)  Indication for Insertion or Continuance of Line Limited venous access - need for IV therapy >5 days (PICC only);Poor Vasculature-patient has had multiple peripheral attempts or PIVs lasting less than 24 hours 03/28/2014  6:42 PM  Exposed Catheter (cm) 0 cm 03/28/2014  6:42 PM  Site Assessment Clean;Dry;Intact 03/28/2014  6:42 PM  Line Status Flushed;Saline locked;Blood return noted 03/28/2014  6:42 PM  Dressing Type Transparent 03/28/2014  6:42 PM  Dressing Status Clean;Dry;Intact;Antimicrobial disc in place 03/28/2014  6:42 PM  Dressing Change Due 04/04/14 03/28/2014  6:42 PM       Woodie Trusty, Nicolette Bang 03/28/2014, 6:42 PM

## 2014-03-28 NOTE — ED Provider Notes (Signed)
CSN: [id number]     Arrival date & time 03/28/14  1147 History   First MD Initiated Contact with Patient 03/28/14 1300     Chief Complaint  Patient presents with  . Rectal Bleeding  . Chest Pain     (Consider location/radiation/quality/duration/timing/severity/associated sxs/prior Treatment) HPI  This is an 80 year old with history of diabetes, hypertension, hyperlipidemia, coronary artery disease, atrial fibrillation on Coumadin who presents with GI bleed. Patient was evaluated on October 16 for the same. At that time she was stable and elected for outpatient GI followup. Patient reports that over the weekend she's had continued passage of clots from her rectum. She reports generalized weakness. She also reports intermittent dizziness. She had one episode of chest pain yesterday that was midepigastric and sternal and described as pressure. She denies any pain right now. She denies any abdominal pain or hematemesis. Denies any prior episodes of bright red blood per rectum.  Past Medical History  Diagnosis Date  . Asthma   . Diabetes mellitus   . Hypertension   . Hyperlipidemia   . CAD (coronary artery disease)     s/p CABG s/p AO valve replacement ([place] CV)  . Recurrent UTI   . Paroxysmal atrial fibrillation     cards d/c coumadin 12/2008 d/t persisten NSR and frequent falls- restarted coumadin july 2011, now on Eliquis  . Hyperthyroidism   . Dry skin   . Osteoarthritis   . Osteopenia     per dexa 12/09  . Keloid     @ chest  . Dizziness     Chronic, admiet 07-2010,saw neuro, thought to be a peripheral issue   . Anxiety 11/20/2011  . CVA (cerebral infarction) 08-2013    multiple, L, d/t Afib, started eloquis  . Memory loss    Past Surgical History  Procedure Laterality Date  . Abdominal hysterectomy    . Total knee arthroplasty  1999  . Oophorectomy    . Cagb  1998  . Cesarean section      x 2  . Hemorrhoid surgery     Family History  Problem Relation Age of  Onset  . Colon cancer Neg Hx   . Breast cancer Neg Hx   . Thyroid disease Neg Hx   . Coronary artery disease Son 69    deceased  . Heart attack Father   . Hypertension Brother   . Stroke Brother    History  Substance Use Topics  . Smoking status: Former Research scientist (life sciences)  . Smokeless tobacco: Never Used     Comment: quit 2004, smoked 1.5 ppd  . Alcohol Use: No   OB History   Grav Para Term Preterm Abortions TAB SAB Ect Mult Living                 Review of Systems  Constitutional: Negative for fever.  Respiratory: Positive for chest tightness. Negative for cough and shortness of breath.   Cardiovascular: Positive for chest pain.  Gastrointestinal: Positive for blood in stool. Negative for nausea, vomiting, abdominal pain, diarrhea and constipation.  Genitourinary: Negative for dysuria.  Musculoskeletal: Negative for back pain.  Neurological: Positive for dizziness. Negative for headaches.  Psychiatric/Behavioral: Negative for confusion.  All other systems reviewed and are negative.     Allergies  Hydrocodone and Tramadol hcl  Home Medications   Prior to Admission medications   Medication Sig Start Date End Date Taking? Authorizing Provider  acetaminophen (TYLENOL) 500 MG tablet Take 1,000 mg by mouth every 6 (  six) hours as needed for moderate pain (pain).   Yes Historical Provider, MD  albuterol (PROVENTIL HFA;VENTOLIN HFA) 108 (90 BASE) MCG/ACT inhaler Inhale 2 puffs into the lungs every 6 (six) hours as needed for wheezing or shortness of breath (wheezing).   Yes Historical Provider, MD  ALPRAZolam Duanne Moron) 0.5 MG tablet Take 0.5 mg by mouth at bedtime as needed for anxiety or sleep (insomnia).    Yes Historical Provider, MD  apixaban (ELIQUIS) 2.5 MG TABS tablet Take 1 tablet (2.5 mg total) by mouth 2 (two) times daily. 09/08/13  Yes Troy Sine, MD  aspirin 81 MG tablet Take 81 mg by mouth daily.   Yes Historical Provider, MD  atorvastatin (LIPITOR) 40 MG tablet Take 40 mg by  mouth daily at 6 PM.   Yes Historical Provider, MD  beclomethasone (QVAR) 80 MCG/ACT inhaler Inhale 2 puffs into the lungs 2 (two) times daily.   Yes Historical Provider, MD  benazepril (LOTENSIN) 20 MG tablet Take 20 mg by mouth daily.   Yes Historical Provider, MD  Calcium Carbonate (CALCIUM 500 PO) Take 500 mg by mouth at bedtime.    Yes Historical Provider, MD  cholecalciferol (VITAMIN D) 1000 UNITS tablet Take 1,000 Units by mouth daily.     Yes Historical Provider, MD  diltiazem (DILACOR XR) 120 MG 24 hr capsule Take 120 mg by mouth 2 (two) times daily.   Yes Historical Provider, MD  docusate sodium (COLACE) 100 MG capsule Take 1 capsule (100 mg total) by mouth daily. 03/25/14  Yes Pamella Pert, MD  esomeprazole (NEXIUM) 40 MG capsule Take 1 capsule (40 mg total) by mouth daily before breakfast. 03/07/14  Yes Colon Branch, MD  furosemide (LASIX) 20 MG tablet Take 1 tablet (20 mg total) by mouth daily. 09/15/13  Yes Colon Branch, MD  glucose blood test strip 1 each by Other route as needed for other (blood sugar test). Use as instructed   Yes Historical Provider, MD  methimazole (TAPAZOLE) 5 MG tablet Take 5 mg by mouth daily.   Yes Historical Provider, MD  metoprolol tartrate (LOPRESSOR) 25 MG tablet Take 25 mg by mouth 3 (three) times daily.   Yes Historical Provider, MD  nitroGLYCERIN (NITROSTAT) 0.4 MG SL tablet Place 0.4 mg under the tongue every 5 (five) minutes as needed for chest pain (chest pain).    Yes Historical Provider, MD  sitaGLIPtin (JANUVIA) 50 MG tablet Take 50 mg by mouth daily.   Yes Historical Provider, MD  TRAVATAN Z 0.004 % SOLN ophthalmic solution Place 1 drop into both eyes at bedtime.  06/16/13  Yes Historical Provider, MD   BP 109/82  Pulse 66  Temp(Src) 98.2 F (36.8 C) (Oral)  Resp 18  Ht 5\' 2"  (1.575 m)  Wt 160 lb 15 oz (73 kg)  BMI 29.43 kg/m2  SpO2 100% Physical Exam  Nursing note and vitals reviewed. Constitutional: She is oriented to person, place, and  time. No distress.  Elderly  HENT:  Head: Normocephalic and atraumatic.  Mouth/Throat: Oropharynx is clear and moist.  Eyes: Pupils are equal, round, and reactive to light.  Cardiovascular: Normal rate and normal heart sounds.   No murmur heard. Pulmonary/Chest: Effort normal and breath sounds normal. No respiratory distress. She has no wheezes.  Abdominal: Soft. Bowel sounds are normal. There is no tenderness. There is no rebound and no guarding.  Genitourinary:  No rectal masses or hemorrhoids noted, gross melena with intermittent bright red blood on exam  Neurological: She is alert and oriented to person, place, and time.  Skin: Skin is warm and dry.  Psychiatric: She has a normal mood and affect.    ED Course  Procedures (including critical care time) Labs Review Labs Reviewed  CBC WITH DIFFERENTIAL - Abnormal; Notable for the following:    RBC 2.94 (*)    Hemoglobin 9.5 (*)    HCT 29.4 (*)    Monocytes Relative 13 (*)    All other components within normal limits  COMPREHENSIVE METABOLIC PANEL - Abnormal; Notable for the following:    Albumin 3.3 (*)    GFR calc non Af Amer 48 (*)    GFR calc Af Amer 56 (*)    All other components within normal limits  URINALYSIS, ROUTINE W REFLEX MICROSCOPIC - Abnormal; Notable for the following:    APPearance CLOUDY (*)    All other components within normal limits  PROTIME-INR - Abnormal; Notable for the following:    Prothrombin Time 18.4 (*)    INR 1.52 (*)    All other components within normal limits  APTT - Abnormal; Notable for the following:    aPTT 39 (*)    All other components within normal limits  PROTIME-INR - Abnormal; Notable for the following:    Prothrombin Time 18.7 (*)    INR 1.55 (*)    All other components within normal limits  HEMOGLOBIN A1C - Abnormal; Notable for the following:    Hemoglobin A1C 6.1 (*)    Mean Plasma Glucose 128 (*)    All other components within normal limits  CBC - Abnormal; Notable for  the following:    RBC 2.27 (*)    Hemoglobin 7.4 (*)    HCT 22.2 (*)    All other components within normal limits  POC OCCULT BLOOD, ED - Abnormal; Notable for the following:    Fecal Occult Bld POSITIVE (*)    All other components within normal limits  TSH  MAGNESIUM  PHOSPHORUS  TROPONIN I  TROPONIN I  CBC WITH DIFFERENTIAL  COMPREHENSIVE METABOLIC PANEL  TROPONIN I  I-STAT TROPOININ, ED  TYPE AND SCREEN  ABO/RH  PREPARE RBC (CROSSMATCH)    Imaging Review Dg Chest Port 1 View  03/28/2014   CLINICAL DATA:  PICC placement  EXAM: PORTABLE CHEST - 1 VIEW  COMPARISON:  03/28/2014 at 1337 hr  FINDINGS: New right PICC has its tip in the lower superior vena cava, well positioned. No pneumothorax.  No other change from the prior study. No acute cardiopulmonary disease.  IMPRESSION: Right PICC is well positioned with its tip in the lower superior vena cava.   Electronically Signed   By: Lajean Manes M.D.   On: 03/28/2014 19:16   Dg Chest Portable 1 View  03/28/2014   CLINICAL DATA:  Initial encounter for shortness of breath  EXAM: PORTABLE CHEST - 1 VIEW  COMPARISON:  08/27/2013.  FINDINGS: 1337 hrs. The cardio pericardial silhouette is enlarged. No edema or focal airspace consolidation. There is slight nodularity to the central vascular anatomy of the left mid lung, probably projectional. No pneumothorax or pleural effusion. Imaged bony structures of the thorax are intact. Telemetry leads overlie the chest.  IMPRESSION: Cardiomegaly without pulmonary edema.  Nodular density in the right parahilar region is probably related to vascular anatomy. Followup short-term interval dedicated PA and lateral chest x-ray, after resolution of acute symptoms, is recommended to re-evaluate.   Electronically Signed   By: Verda Cumins.D.  On: 03/28/2014 13:59     EKG Interpretation   Date/Time:  Monday March 28 2014 12:45:27 EDT Ventricular Rate:  79 PR Interval:    QRS Duration: 74 QT Interval:   342 QTC Calculation: 392 R Axis:   26 Text Interpretation:  Atrial fibrillation Nonspecific T wave abnormality ,  probably digitalis effect Abnormal ECG SImilar to prior Confirmed by  HORTON  MD, COURTNEY ([id number]) on 03/28/2014 1:00:48 PM      MDM   Final diagnoses:  Acute GI bleeding  Chest pain, unspecified chest pain type    Patient presents with continued rectal bleeding.  Vital signs are stable at this time but no evidence of orthostasis. She has evidence of gross blood and melena on exam. Hemoglobin has dropped 2 points since the 16th. Discuss with our GI. They will evaluate the patient.  Regarding patient's chest pain yesterday. Chest x-ray and EKG are reassuring. Initial troponin is negative. Will admit for further evaluation to the hospitalist service.  Patient was typed and screened. No indication for transfusion at this time.    Merryl Hacker, MD 03/29/14 [id number]

## 2014-03-29 DIAGNOSIS — I10 Essential (primary) hypertension: Secondary | ICD-10-CM

## 2014-03-29 DIAGNOSIS — R413 Other amnesia: Secondary | ICD-10-CM

## 2014-03-29 LAB — COMPREHENSIVE METABOLIC PANEL
ALK PHOS: 70 U/L (ref 39–117)
ALT: 12 U/L (ref 0–35)
AST: 16 U/L (ref 0–37)
Albumin: 2.6 g/dL — ABNORMAL LOW (ref 3.5–5.2)
Anion gap: 11 (ref 5–15)
BUN: 16 mg/dL (ref 6–23)
CO2: 27 mEq/L (ref 19–32)
Calcium: 8.1 mg/dL — ABNORMAL LOW (ref 8.4–10.5)
Chloride: 110 mEq/L (ref 96–112)
Creatinine, Ser: 0.87 mg/dL (ref 0.50–1.10)
GFR calc Af Amer: 66 mL/min — ABNORMAL LOW (ref >90)
GFR calc non Af Amer: 57 mL/min — ABNORMAL LOW (ref >90)
Glucose, Bld: 108 mg/dL — ABNORMAL HIGH (ref 70–99)
POTASSIUM: 3.4 meq/L — AB (ref 3.7–5.3)
SODIUM: 148 meq/L — AB (ref 137–147)
TOTAL PROTEIN: 5.2 g/dL — AB (ref 6.0–8.3)
Total Bilirubin: 0.3 mg/dL (ref 0.3–1.2)

## 2014-03-29 LAB — CBC
HCT: 22.2 % — ABNORMAL LOW (ref 36.0–46.0)
HEMOGLOBIN: 7.4 g/dL — AB (ref 12.0–15.0)
MCH: 32.6 pg (ref 26.0–34.0)
MCHC: 33.3 g/dL (ref 30.0–36.0)
MCV: 97.8 fL (ref 78.0–100.0)
Platelets: 161 10*3/uL (ref 150–400)
RBC: 2.27 MIL/uL — ABNORMAL LOW (ref 3.87–5.11)
RDW: 13.6 % (ref 11.5–15.5)
WBC: 5.1 10*3/uL (ref 4.0–10.5)

## 2014-03-29 LAB — TSH: TSH: 0.432 u[IU]/mL (ref 0.350–4.500)

## 2014-03-29 LAB — TROPONIN I
Troponin I: <0.3 ng/mL (ref <0.30)
Troponin I: <0.3 ng/mL (ref <0.30)

## 2014-03-29 LAB — PREPARE RBC (CROSSMATCH)

## 2014-03-29 LAB — GLUCOSE, CAPILLARY: GLUCOSE-CAPILLARY: 117 mg/dL — AB (ref 70–99)

## 2014-03-29 MED ORDER — PEG-KCL-NACL-NASULF-NA ASC-C 100 G PO SOLR
0.5000 | Freq: Once | ORAL | Status: AC
  Administered 2014-03-29: 100 g via ORAL
  Filled 2014-03-29: qty 1

## 2014-03-29 MED ORDER — PEG-KCL-NACL-NASULF-NA ASC-C 100 G PO SOLR: 1.0000 | Freq: Once | ORAL | Status: DC

## 2014-03-29 MED ORDER — SODIUM CHLORIDE 0.9 % IV SOLN: Freq: Once | INTRAVENOUS | Status: DC

## 2014-03-29 MED ORDER — PEG-KCL-NACL-NASULF-NA ASC-C 100 G PO SOLR
0.5000 | Freq: Once | ORAL | Status: AC
  Administered 2014-03-30: 100 g via ORAL
  Filled 2014-03-29: qty 1

## 2014-03-29 NOTE — Care Management Note (Addendum)
    Page 1 of 2   04/01/2014     12:56:29 PM CARE MANAGEMENT NOTE 04/01/2014  Patient:  Rebecca Skinner, Rebecca Skinner   Account Number:  [id number]  Date Initiated:  03/29/2014  Documentation initiated by:  [facility]  Subjective/Objective Assessment:   80 Y/O F ADMITTED W/RECTAL BLEED.     Action/Plan:   FROM HOME W/[facility].HAS RW.   Anticipated DC Date:  04/01/2014   Anticipated DC Plan:  [place]  CM consult      Choice offered to / List presented to:  C-1 Patient        [place] arranged  HH-1 RN  Deal.   Status of service:  Completed, signed off Medicare Important Message given?  YES (If response is "NO", the following Medicare IM given date fields will be blank) Date Medicare IM given:  03/31/2014 Medicare IM given by:  [facility] Date Additional Medicare IM given:   Additional Medicare IM given by:    Discharge Disposition:  [place]  Per UR Regulation:  Reviewed for med. necessity/level of care/duration of stay  If discussed at [place] of Stay Meetings, dates discussed:    Comments:  04/01/14 Dalton Molesworth RN,BSN NCM [address] [place] AGREED TO ACCEPTING SERVICES.HHRN/PT ORDERS PLACED. TC K[facility]ST[facility] REP AWARE OF D/C & HHC ORDERS.NO FURTHER D/C NEEDS.  03/31/14 Ozro Russett RN,BSN NCM [phone] AWAIT PT RECOMMENDATIONS.  03/30/14 Zakariah Dejarnette RN,BSN NCM [phone] S/P COLONOSCOPY-ACTIVELY BLEEDING,L SIDE DIVERTICULOSIS,GI FOLLOWING-CLEARS,MAY NEED IR-EMBOLIZATION IF BLEEDING CONTINUES.MONITOR PROGRESS.RECOMMEND PT CONS WHEN APPROPRIATE.CURRENT D/C PLAN HOME.  03/29/14 Achilles Neville RN,BSN NCM [phone] GI-FOR COLONOSCOPY IN AM.

## 2014-03-29 NOTE — Progress Notes (Signed)
[facility] Progress Note  Subjective:  Had two episodes of bleeding this AM with some clots.  Denies any abdominal pain.  Going to get PRBC transfusion.  Objective:  Vital signs in last 24 hours: Temp:  [97.3 F (36.3 C)-98.2 F (36.8 C)] 98 F (36.7 C) (10/20 0716) Pulse Rate:  [56-83] 83 (10/20 0716) Resp:  [12-21] 18 (10/20 0716) BP: (109-154)/(48-82) 135/48 mmHg (10/20 0716) SpO2:  [92 %-100 %] 100 % (10/20 0716) Weight:  [160 lb 15 oz (73 kg)-161 lb 9.6 oz (73.3 kg)] 160 lb 15 oz (73 kg) (10/20 0500) Last BM Date: 03/28/14 General:  Alert, Well-developed, in NAD Heart:  Regular rate and rhythm; no murmurs Pulm:  CTAB.  No W/R/R. Abdomen:  Soft, non-distended. Normal bowel sounds.  Non-tender.  Extremities:  Without edema. Neurologic:  Alert and  oriented x4;  grossly normal neurologically. Psych:  Alert and cooperative. Normal mood and affect.  Intake/Output from previous day: 10/19 0701 - 10/20 0700 In: 1220 [P.O.:480; I.V.:740] Out: -   Lab Results:  Recent Labs  03/28/14 1340 03/29/14 0550  WBC 6.9 5.1  HGB 9.5* 7.4*  HCT 29.4* 22.2*  PLT 202 161   BMET  Recent Labs  03/28/14 1340 03/29/14 0550  NA 141 148*  K 4.1 3.4*  CL 104 110  CO2 25 27  GLUCOSE 93 108*  BUN 19 16  CREATININE 1.00 0.87  CALCIUM 8.9 8.1*   LFT  Recent Labs  03/29/14 0550  PROT 5.2*  ALBUMIN 2.6*  AST 16  ALT 12  ALKPHOS 70  BILITOT 0.3   PT/INR  Recent Labs  03/28/14 1340 03/28/14 1628  LABPROT 18.4* 18.7*  INR 1.52* 1.55*   Dg Chest Port 1 View  03/28/2014   CLINICAL DATA:  PICC placement  EXAM: PORTABLE CHEST - 1 VIEW  COMPARISON:  03/28/2014 at 1337 hr  FINDINGS: New right PICC has its tip in the lower superior vena cava, well positioned. No pneumothorax.  No other change from the prior study. No acute cardiopulmonary disease.  IMPRESSION: Right PICC is well positioned with its tip in the lower superior vena cava.   Electronically  Signed   By: Lajean Manes M.D.   On: 03/28/2014 19:16   Dg Chest Portable 1 View  03/28/2014   CLINICAL DATA:  Initial encounter for shortness of breath  EXAM: PORTABLE CHEST - 1 VIEW  COMPARISON:  08/27/2013.  FINDINGS: 1337 hrs. The cardio pericardial silhouette is enlarged. No edema or focal airspace consolidation. There is slight nodularity to the central vascular anatomy of the left mid lung, probably projectional. No pneumothorax or pleural effusion. Imaged bony structures of the thorax are intact. Telemetry leads overlie the chest.  IMPRESSION: Cardiomegaly without pulmonary edema.  Nodular density in the right parahilar region is probably related to vascular anatomy. Followup short-term interval dedicated PA and lateral chest x-ray, after resolution of acute symptoms, is recommended to re-evaluate.   Electronically Signed   By: Misty Stanley M.D.   On: 03/28/2014 13:59   Assessment / Plan: -Painless GI bleeding: Suspect lower source. Possible diverticular bleed vs AVM vs malignancy.  -Acute blood loss anemia: Hgb down 2 more grams from yesterday (7.4 grams this AM).  Going to be transfused with PRBC's.  -PAF on Eliquis (on hold) -History of CVA's  -CAD s/p CABG   *Agree with transfusion.  Monitor Hgb. *Hold Eliquis for now.  *Will likely plan for colonoscopy 10/21, but will confirm with  Dr. Henrene Pastor first. *Clear liquids for now.   **Colonoscopy 04/2004 by Dr. Earlean Shawl showed internal hemorrhoids and hypertrophied anal papilla.  We are going to have the records scanned into EPIC.   LOS: 1 day   ZEHR, JESSICA D.  03/29/2014, 8:57 AM  Pager number [phone]  GI ATTENDING  Interval history and data reviewed. Patient personally seen and examined. Agree with H&P as above. Patient is doing well. No complaints. No significant bleeding. Receiving transfusion. Previous colonoscopy from 2005 as outlined above. Suspect that she has had self-limited diverticular bleed versus hemorrhoids. Plan  colonoscopy tomorrow with Eliquis washout completed.The nature of the procedure, as well as the risks, benefits, and alternatives were carefully and thoroughly reviewed with the patient. Ample time for discussion and questions allowed. The patient understood, was satisfied, and agreed to proceed.   Docia Chuck. Geri Seminole., M.D. [facility] Division of Gastroenterology

## 2014-03-29 NOTE — Progress Notes (Signed)
[facility] PROGRESS NOTE  Rebecca Skinner TZG:[id number] DOB: [birth year] DOA: 03/28/2014 PCP: Kathlene November, MD  Assessment/Plan: Probable Lower GI bleed / Acute blood loss anemia / Long term current use of anticoagulant therapy  Suspect related to eliquis and aspirin which both are on hold.  GI consulted, possible need for colonoscopy 10/21  Started protonix 40 mg IV Q 12 hours.  Will transfuse if hemoglobin drops less than 8.  BP remains stable.  Clear liquid diet now and NPO post midnight for possible colonoscopy in am.  Thyrotoxicosis  Check TSH - normal Resume methimazole  Rate controlled with Cardizem and metoprolol.  Diabetes mellitus with circulatory complication  Check P5F - 6.1 Resume home meds Dyslipidemia  Resume statin therapy  Essential hypertension  Resumed Cardizem and metoprolol.  CAD (coronary artery disease) of artery bypass graft / ATRIAL FIBRILLATION, PAROXYSMAL / chest pain  No complaints of chest pain at this time. Serial trop neg x 3  Last 2 D ECHO in 2014 with EF of 55%.  Eliquis and aspirin on hold due to bleed. Intrinsic asthma  Resume home inhalers.  No wheezing on exam  Code Status: Full Family Communication: Pt in room (indicate person spoken with, relationship, and if by phone, the number) Disposition Plan: Pending   Consultants:  GI  Procedures:    Antibiotics:  none (indicate start date, and stop date if known)  HPI/Subjective: No complaints  Objective: Filed Vitals:   03/28/14 2124 03/29/14 0500 03/29/14 0716 03/29/14 1009  BP: 109/82  135/48   Pulse: 66  83   Temp: 98.2 F (36.8 C)  98 F (36.7 C)   TempSrc: Oral  Oral   Resp: 18  18   Height:      Weight:  73 kg (160 lb 15 oz)    SpO2:   100% 98%    Intake/Output Summary (Last 24 hours) at 03/29/14 1253 Last data filed at 03/29/14 0946  Gross per 24 hour  Intake   1700 ml  Output      0 ml  Net   1700 ml   Filed Weights   03/28/14 1525 03/29/14 0500  Weight:  73.3 kg (161 lb 9.6 oz) 73 kg (160 lb 15 oz)    Exam:   General:  Awake in nad  Cardiovascular: regular, s1, s2  Respiratory: normal resp effort, no wheezing  Abdomen: soft,nondistended   Musculoskeletal: perfused, no clubbing   Data Reviewed: Basic Metabolic Panel:  Recent Labs Lab 03/25/14 1544 03/28/14 1340 03/29/14 0550  NA 142 141 148*  K 4.2 4.1 3.4*  CL 101 104 110  CO2 28 25 27   GLUCOSE 100* 93 108*  BUN 18 19 16   CREATININE 1.00 1.00 0.87  CALCIUM 9.8 8.9 8.1*  MG  --  1.8  --   PHOS  --  3.7  --    Liver Function Tests:  Recent Labs Lab 03/25/14 1544 03/28/14 1340 03/29/14 0550  AST 25 21 16   ALT 18 15 12   ALKPHOS 114 93 70  BILITOT 0.4 0.3 0.3  PROT 7.7 6.8 5.2*  ALBUMIN 3.9 3.3* 2.6*   No results found for this basename: LIPASE, AMYLASE,  in the last 168 hours No results found for this basename: AMMONIA,  in the last 168 hours CBC:  Recent Labs Lab 03/25/14 1544 03/28/14 1340 03/29/14 0550  WBC 8.0 6.9 5.1  NEUTROABS 5.2 4.2  --   HGB 11.4* 9.5* 7.4*  HCT 35.2* 29.4* 22.2*  MCV 99.7 100.0 97.8  PLT 162 202 161   Cardiac Enzymes:  Recent Labs Lab 03/28/14 1835 03/29/14 0031 03/29/14 0550  TROPONINI <0.30 <0.30 <0.30   BNP (last 3 results) No results found for this basename: PROBNP,  in the last 8760 hours CBG:  Recent Labs Lab 03/29/14 0751  GLUCAP 117*    No results found for this or any previous visit (from the past 240 hour(s)).   Studies: Dg Chest Port 1 View  03/28/2014   CLINICAL DATA:  PICC placement  EXAM: PORTABLE CHEST - 1 VIEW  COMPARISON:  03/28/2014 at 1337 hr  FINDINGS: New right PICC has its tip in the lower superior vena cava, well positioned. No pneumothorax.  No other change from the prior study. No acute cardiopulmonary disease.  IMPRESSION: Right PICC is well positioned with its tip in the lower superior vena cava.   Electronically Signed   By: Lajean Manes M.D.   On: 03/28/2014 19:16   Dg Chest  Portable 1 View  03/28/2014   CLINICAL DATA:  Initial encounter for shortness of breath  EXAM: PORTABLE CHEST - 1 VIEW  COMPARISON:  08/27/2013.  FINDINGS: 1337 hrs. The cardio pericardial silhouette is enlarged. No edema or focal airspace consolidation. There is slight nodularity to the central vascular anatomy of the left mid lung, probably projectional. No pneumothorax or pleural effusion. Imaged bony structures of the thorax are intact. Telemetry leads overlie the chest.  IMPRESSION: Cardiomegaly without pulmonary edema.  Nodular density in the right parahilar region is probably related to vascular anatomy. Followup short-term interval dedicated PA and lateral chest x-ray, after resolution of acute symptoms, is recommended to re-evaluate.   Electronically Signed   By: Misty Stanley M.D.   On: 03/28/2014 13:59    Scheduled Meds: . sodium chloride   Intravenous Once  . atorvastatin  40 mg Oral q1800  . cholecalciferol  1,000 Units Oral Daily  . diltiazem  120 mg Oral BID  . docusate sodium  100 mg Oral Daily  . fluticasone  2 puff Inhalation BID  . latanoprost  1 drop Both Eyes QHS  . linagliptin  5 mg Oral Daily  . methimazole  5 mg Oral Daily  . metoprolol tartrate  25 mg Oral TID  . pantoprazole (PROTONIX) IV  40 mg Intravenous Q12H  . sodium chloride  3 mL Intravenous Q12H   Continuous Infusions: . sodium chloride 75 mL/hr at 03/29/14 1015    Principal Problem:   GI bleed Active Problems:   Thyrotoxicosis   Diabetes mellitus with circulatory complication   Dyslipidemia   Essential hypertension   CAD (coronary artery disease) of artery bypass graft   ATRIAL FIBRILLATION, PAROXYSMAL   Intrinsic asthma   Acute blood loss anemia   Long term current use of anticoagulant therapy  Time spent: 44min  [facility], [facility] Pager [phone]. If 7PM-7AM, please contact night-coverage at [email or web address], password [facility] 03/29/2014, 12:53 PM  LOS: 1 day

## 2014-03-30 ENCOUNTER — Inpatient Hospital Stay (HOSPITAL_COMMUNITY): Payer: Medicare Other | Admitting: Registered Nurse

## 2014-03-30 ENCOUNTER — Encounter (HOSPITAL_COMMUNITY): Payer: Medicare Other | Admitting: Registered Nurse

## 2014-03-30 ENCOUNTER — Encounter (HOSPITAL_COMMUNITY): Payer: Self-pay | Admitting: *Deleted

## 2014-03-30 ENCOUNTER — Encounter (HOSPITAL_COMMUNITY)
Admission: EM | Disposition: A | Discharge status: Home Health | Payer: Self-pay | Source: Home / Self Care | Attending: Internal Medicine

## 2014-03-30 DIAGNOSIS — I998 Other disorder of circulatory system: Secondary | ICD-10-CM

## 2014-03-30 DIAGNOSIS — E0591 Thyrotoxicosis, unspecified with thyrotoxic crisis or storm: Secondary | ICD-10-CM

## 2014-03-30 DIAGNOSIS — E785 Hyperlipidemia, unspecified: Secondary | ICD-10-CM

## 2014-03-30 DIAGNOSIS — E1159 Type 2 diabetes mellitus with other circulatory complications: Secondary | ICD-10-CM

## 2014-03-30 HISTORY — PX: COLONOSCOPY WITH PROPOFOL: SHX5780

## 2014-03-30 LAB — CBC
HCT: 24.5 % — ABNORMAL LOW (ref 36.0–46.0)
HEMOGLOBIN: 8.2 g/dL — AB (ref 12.0–15.0)
MCH: 31.3 pg (ref 26.0–34.0)
MCHC: 33.5 g/dL (ref 30.0–36.0)
MCV: 93.5 fL (ref 78.0–100.0)
Platelets: 152 10*3/uL (ref 150–400)
RBC: 2.62 MIL/uL — ABNORMAL LOW (ref 3.87–5.11)
RDW: 16.8 % — ABNORMAL HIGH (ref 11.5–15.5)
WBC: 5.9 10*3/uL (ref 4.0–10.5)

## 2014-03-30 LAB — GLUCOSE, CAPILLARY: GLUCOSE-CAPILLARY: 90 mg/dL (ref 70–99)

## 2014-03-30 SURGERY — COLONOSCOPY WITH PROPOFOL
Anesthesia: Monitor Anesthesia Care

## 2014-03-30 MED ORDER — PROPOFOL 10 MG/ML IV BOLUS
INTRAVENOUS | Status: AC
  Filled 2014-03-30: qty 20

## 2014-03-30 MED ORDER — LACTATED RINGERS IV SOLN
INTRAVENOUS | Status: DC
  Administered 2014-03-30: 09:00:00 via INTRAVENOUS

## 2014-03-30 MED ORDER — LIDOCAINE HCL (CARDIAC) 20 MG/ML IV SOLN
INTRAVENOUS | Status: DC | PRN
  Administered 2014-03-30: 100 mg via INTRAVENOUS

## 2014-03-30 MED ORDER — SODIUM CHLORIDE 0.9 % IV SOLN: INTRAVENOUS | Status: DC

## 2014-03-30 MED ORDER — INSULIN ASPART 100 UNIT/ML ~~LOC~~ SOLN
0.0000 [IU] | Freq: Three times a day (TID) | SUBCUTANEOUS | Status: DC
  Administered 2014-03-31 – 2014-04-01 (×3): 1 [IU] via SUBCUTANEOUS

## 2014-03-30 MED ORDER — POTASSIUM CHLORIDE CRYS ER 20 MEQ PO TBCR
40.0000 meq | EXTENDED_RELEASE_TABLET | Freq: Once | ORAL | Status: AC
  Administered 2014-03-30: 40 meq via ORAL
  Filled 2014-03-30: qty 2

## 2014-03-30 MED ORDER — LIDOCAINE HCL (CARDIAC) 20 MG/ML IV SOLN
INTRAVENOUS | Status: AC
  Filled 2014-03-30: qty 5

## 2014-03-30 MED ORDER — SODIUM CHLORIDE 0.9 % IV SOLN
INTRAVENOUS | Status: DC
  Administered 2014-03-30: 12:00:00 via INTRAVENOUS

## 2014-03-30 MED ORDER — PROPOFOL INFUSION 10 MG/ML OPTIME
INTRAVENOUS | Status: DC | PRN
  Administered 2014-03-30: 100 ug/kg/min via INTRAVENOUS

## 2014-03-30 MED ORDER — PROMETHAZINE HCL 25 MG/ML IJ SOLN: 6.2500 mg | INTRAMUSCULAR | Status: DC | PRN

## 2014-03-30 SURGICAL SUPPLY — 22 items

## 2014-03-30 NOTE — Op Note (Signed)
[facility] [place], [address]   COLONOSCOPY PROCEDURE REPORT  PATIENT: Rebecca Skinner, Rebecca Skinner  MR#: [id number] BIRTHDATE: 04-Jun-1923 , 44  yrs. old GENDER: female ENDOSCOPIST: Eustace Quail, MD REFERRED [facility] PROCEDURE DATE:  03/30/2014 PROCEDURE:   Colonoscopy with control of bleeding . Endo Clip x2 First Screening Colonoscopy - Avg.  risk and is 50 yrs.  old or older - No.  Prior Negative Screening - Now for repeat screening. N/A  History of Adenoma - Now for follow-up colonoscopy & has been > or = to 3 yrs.  N/A  Polyps Removed Today? No.  Recommend repeat exam, <10 yrs? No. ASA CLASS:   Class III INDICATIONS:hematochezia. MEDICATIONS: Monitored anesthesia care and Per Anesthesia  DESCRIPTION OF PROCEDURE:   After the risks benefits and alternatives of the procedure were thoroughly explained, informed consent was obtained.  The digital rectal exam revealed no abnormalities of the rectum.   The Pentax Ped Colon [id number] endoscope was introduced through the anus and advanced to the cecum, which was identified by both the appendix and ileocecal valve(figure 2 and 7). No adverse events experienced.   The quality of the prep was excellent, using MoviPrep  The instrument was then slowly withdrawn as the colon was fully examined.  COLON FINDINGS: The colonoscope was advanced to the cecal tip. There was fresh blood throughout the colon.  Mild to moderate left-sided diverticulosis.  In the cecum wa an actively bleeding vascular lesion which was nipple shaped (see photos 3, 4, and 5 ). This was treated with Endo Clip x2 with hemostasis achieved (figure 6).  The colon was otherwise normal , with some limitations to visualization from blood.  Retroflexed views revealed no abnormalities(figure 8). . The time to cecum=mi.  Withdrawal time=mi  The scope was withdrawn and the procedure completed. COMPLICATIONS: There were no immediate  complications.  ENDOSCOPIC IMPRESSION: 1. 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 Incidental left-sided diverticulosis  RECOMMENDATIONS: 1. Return to hospital and observe closely for any evidence of recurrent bleeding. Monitor blood counts and vital signs. Transfuse as clinically indicated. 2. Clear liquids for now 3. If the patient were to rebleed, could consider interventional radiology directed embolization therapy . Discussed with family  eSigned:  Eustace Quail, MD 03/30/2014 10:51 AM   cc: The Patient    ; Kathlene November, MD

## 2014-03-30 NOTE — Transfer of Care (Signed)
Immediate Anesthesia Transfer of Care Note  Patient: Rebecca Skinner  Procedure(s) Performed: Procedure(s): COLONOSCOPY WITH PROPOFOL (N/A)  Patient Location: PACU and Endoscopy Unit  Anesthesia Type:MAC  Level of Consciousness: awake, alert , oriented and patient cooperative  Airway & Oxygen Therapy: Patient Spontanous Breathing and Patient connected to face mask oxygen  Post-op Assessment: Report given to PACU RN, Post -op Vital signs reviewed and stable and Patient moving all extremities  Post vital signs: Reviewed and stable  Complications: No apparent anesthesia complications

## 2014-03-30 NOTE — Anesthesia Preprocedure Evaluation (Addendum)
Anesthesia Evaluation  Patient identified by MRN, date of birth, ID band Patient awake    Reviewed: Allergy & Precautions, H&P , NPO status , Patient's Chart, lab work & pertinent test results  Airway Mallampati: II TM Distance: >3 FB Neck ROM: Full    Dental no notable dental hx.    Pulmonary neg pulmonary ROS, former smoker,  breath sounds clear to auscultation  Pulmonary exam normal       Cardiovascular hypertension, Pt. on medications + CAD and + CABG + Valvular Problems/Murmurs Rhythm:Regular Rate:Normal + Systolic murmurs s/p CABG s/p AO valve replacement ([employer] CV)  Paroxysmal atrial fibrillation   cards d/c coumadin 12/2008 d/t persisten NSR and frequent falls- restarted coumadin july 2011, now on Eliquis   EF 55% 2014   Neuro/Psych CVA negative psych ROS   GI/Hepatic negative GI ROS, Neg liver ROS,   Endo/Other  negative endocrine ROSdiabetes  Renal/GU negative Renal ROS  negative genitourinary   Musculoskeletal negative musculoskeletal ROS (+)   Abdominal   Peds negative pediatric ROS (+)  Hematology negative hematology ROS (+) anemia ,   Anesthesia Other Findings   Reproductive/Obstetrics negative OB ROS                         Anesthesia Physical Anesthesia Plan  ASA: III  Anesthesia Plan: MAC   Post-op Pain Management:    Induction: Intravenous  Airway Management Planned: Nasal Cannula  Additional Equipment:   Intra-op Plan:   Post-operative Plan:   Informed Consent: I have reviewed the patients History and Physical, chart, labs and discussed the procedure including the risks, benefits and alternatives for the proposed anesthesia with the patient or authorized representative who has indicated his/her understanding and acceptance.   Dental advisory given  Plan Discussed with: CRNA and Surgeon  Anesthesia Plan Comments:         Anesthesia Quick  Evaluation

## 2014-03-30 NOTE — H&P (View-Only) (Signed)
[facility] Progress Note  Subjective:  Had two episodes of bleeding this AM with some clots.  Denies any abdominal pain.  Going to get PRBC transfusion.  Objective:  Vital signs in last 24 hours: Temp:  [97.3 F (36.3 C)-98.2 F (36.8 C)] 98 F (36.7 C) (10/20 0716) Pulse Rate:  [56-83] 83 (10/20 0716) Resp:  [12-21] 18 (10/20 0716) BP: (109-154)/(48-82) 135/48 mmHg (10/20 0716) SpO2:  [92 %-100 %] 100 % (10/20 0716) Weight:  [160 lb 15 oz (73 kg)-161 lb 9.6 oz (73.3 kg)] 160 lb 15 oz (73 kg) (10/20 0500) Last BM Date: 03/28/14 General:  Alert, Well-developed, in NAD Heart:  Regular rate and rhythm; no murmurs Pulm:  CTAB.  No W/R/R. Abdomen:  Soft, non-distended. Normal bowel sounds.  Non-tender.  Extremities:  Without edema. Neurologic:  Alert and  oriented x4;  grossly normal neurologically. Psych:  Alert and cooperative. Normal mood and affect.  Intake/Output from previous day: 10/19 0701 - 10/20 0700 In: 1220 [P.O.:480; I.V.:740] Out: -   Lab Results:  Recent Labs  03/28/14 1340 03/29/14 0550  WBC 6.9 5.1  HGB 9.5* 7.4*  HCT 29.4* 22.2*  PLT 202 161   BMET  Recent Labs  03/28/14 1340 03/29/14 0550  NA 141 148*  K 4.1 3.4*  CL 104 110  CO2 25 27  GLUCOSE 93 108*  BUN 19 16  CREATININE 1.00 0.87  CALCIUM 8.9 8.1*   LFT  Recent Labs  03/29/14 0550  PROT 5.2*  ALBUMIN 2.6*  AST 16  ALT 12  ALKPHOS 70  BILITOT 0.3   PT/INR  Recent Labs  03/28/14 1340 03/28/14 1628  LABPROT 18.4* 18.7*  INR 1.52* 1.55*   Dg Chest Port 1 View  03/28/2014   CLINICAL DATA:  PICC placement  EXAM: PORTABLE CHEST - 1 VIEW  COMPARISON:  03/28/2014 at 1337 hr  FINDINGS: New right PICC has its tip in the lower superior vena cava, well positioned. No pneumothorax.  No other change from the prior study. No acute cardiopulmonary disease.  IMPRESSION: Right PICC is well positioned with its tip in the lower superior vena cava.   Electronically  Signed   By: Lajean Manes M.D.   On: 03/28/2014 19:16   Dg Chest Portable 1 View  03/28/2014   CLINICAL DATA:  Initial encounter for shortness of breath  EXAM: PORTABLE CHEST - 1 VIEW  COMPARISON:  08/27/2013.  FINDINGS: 1337 hrs. The cardio pericardial silhouette is enlarged. No edema or focal airspace consolidation. There is slight nodularity to the central vascular anatomy of the left mid lung, probably projectional. No pneumothorax or pleural effusion. Imaged bony structures of the thorax are intact. Telemetry leads overlie the chest.  IMPRESSION: Cardiomegaly without pulmonary edema.  Nodular density in the right parahilar region is probably related to vascular anatomy. Followup short-term interval dedicated PA and lateral chest x-ray, after resolution of acute symptoms, is recommended to re-evaluate.   Electronically Signed   By: Misty Stanley M.D.   On: 03/28/2014 13:59   Assessment / Plan: -Painless GI bleeding: Suspect lower source. Possible diverticular bleed vs AVM vs malignancy.  -Acute blood loss anemia: Hgb down 2 more grams from yesterday (7.4 grams this AM).  Going to be transfused with PRBC's.  -PAF on Eliquis (on hold) -History of CVA's  -CAD s/p CABG   *Agree with transfusion.  Monitor Hgb. *Hold Eliquis for now.  *Will likely plan for colonoscopy 10/21, but will confirm with  Dr. Henrene Pastor first. *Clear liquids for now.   **Colonoscopy 04/2004 by Dr. Earlean Shawl showed internal hemorrhoids and hypertrophied anal papilla.  We are going to have the records scanned into EPIC.   LOS: 1 day   ZEHR, JESSICA D.  03/29/2014, 8:57 AM  Pager number [phone]  GI ATTENDING  Interval history and data reviewed. Patient personally seen and examined. Agree with H&P as above. Patient is doing well. No complaints. No significant bleeding. Receiving transfusion. Previous colonoscopy from 2005 as outlined above. Suspect that she has had self-limited diverticular bleed versus hemorrhoids. Plan  colonoscopy tomorrow with Eliquis washout completed.The nature of the procedure, as well as the risks, benefits, and alternatives were carefully and thoroughly reviewed with the patient. Ample time for discussion and questions allowed. The patient understood, was satisfied, and agreed to proceed.   Docia Chuck. Geri Seminole., M.D. [facility] Division of Gastroenterology

## 2014-03-30 NOTE — Anesthesia Postprocedure Evaluation (Signed)
  Anesthesia Post-op Note  Patient: Rebecca Skinner  Procedure(s) Performed: Procedure(s) (LRB): COLONOSCOPY WITH PROPOFOL (N/A)  Patient Location: PACU  Anesthesia Type: MAC  Level of Consciousness: awake and alert   Airway and Oxygen Therapy: Patient Spontanous Breathing  Post-op Pain: mild  Post-op Assessment: Post-op Vital signs reviewed, Patient's Cardiovascular Status Stable, Respiratory Function Stable, Patent Airway and No signs of Nausea or vomiting  Last Vitals:  Filed Vitals:   03/30/14 1036  BP: 153/57  Pulse: 70  Temp: 36.6 C  Resp: 22    Post-op Vital Signs: stable   Complications: No apparent anesthesia complications

## 2014-03-30 NOTE — Interval H&P Note (Signed)
History and Physical Interval Note:  03/30/2014 9:32 AM  Rebecca Skinner  has presented today for surgery, with the diagnosis of GI bleeding  The various methods of treatment have been discussed with the patient and family. After consideration of risks, benefits and other options for treatment, the patient has consented to  Procedure(s): COLONOSCOPY WITH PROPOFOL (N/A) as a surgical intervention .  The patient's history has been reviewed, patient examined, no change in status, stable for surgery.  I have reviewed the patient's chart and labs.  Questions were answered to the patient's satisfaction.     [name]

## 2014-03-30 NOTE — Progress Notes (Signed)
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  Follow GI recommendations Continue protonix 40 mg IV Q 12 hours.   Will transfuse for hemoglobin < than 8 (given hx of CAD). Has already received 1 unit of PRBC.  BP remains stable.  Clear liquid diet now   Thyrotoxicosis  Check TSH - normal Continue methimazole  Rate controlled with Cardizem and metoprolol. Continue monitoring on telemetry    Diabetes mellitus with circulatory complication  Check V4B - 6.1 Will continue home meds (tradjenta) and SSI  Dyslipidemia  Continue statin therapy   Essential hypertension  Continue Cardizem and metoprolol.   CAD (coronary artery disease) of artery bypass graft / ATRIAL FIBRILLATION, PAROXYSMAL / chest pain  No complaints of chest pain at this time.  Serial trop neg x 3  Last 2 D ECHO in 2014 with EF of 55%.  Eliquis and aspirin on hold due to GI bleed.  Intrinsic asthma  Continue inhalers.  No wheezing on exam  Code Status: Full Family Communication: no family at bedside Disposition Plan: to be determine    Consultants:  GI  Procedures:  Colonoscopy 10/21:  left diverticulosis Cecum AVM (with active bleeding)  Antibiotics:  none (indicate start date, and stop date if known)  HPI/Subjective: Feeling ok, denies abd pain. Tolerating CLD  Objective: Filed Vitals:   03/30/14 1040 03/30/14 1050 03/30/14 1140 03/30/14 1427  BP: 160/83 191/82  125/86  Pulse:  50  85  Temp:  97.8 F (36.6 C)  98 F (36.7 C)  TempSrc:    Oral  Resp: 23 23  20   Height:      Weight:      SpO2: 100% 94% 100% 96%    Intake/Output Summary (Last 24 hours) at 03/30/14 1904 Last data filed at 03/30/14 1500  Gross per 24  hour  Intake 2625.17 ml  Output      0 ml  Net 2625.17 ml   Filed Weights   03/28/14 1525 03/29/14 0500 03/30/14 0627  Weight: 73.3 kg (161 lb 9.6 oz) 73 kg (160 lb 15 oz) 74.844 kg (165 lb)    Exam:   General:  Awake in NAD; afebrile. tolerating CLD  Cardiovascular: regular, s1, s2; no rubs or gallops  Respiratory: normal resp effort, no wheezing, no crackles  Abdomen: soft, non-distended; positive BS  Musculoskeletal: perfused, no clubbing   Data Reviewed: Basic Metabolic Panel:  Recent Labs Lab 03/25/14 1544 03/28/14 1340 03/29/14 0550  NA 142 141 148*  K 4.2 4.1 3.4*  CL 101 104 110  CO2 28 25 27   GLUCOSE 100* 93 108*  BUN 18 19 16   CREATININE 1.00 1.00 0.87  CALCIUM 9.8 8.9 8.1*  MG  --  1.8  --   PHOS  --  3.7  --    Liver Function Tests:  Recent Labs Lab 03/25/14 1544 03/28/14 1340 03/29/14 0550  AST 25 21 16   ALT 18 15 12   ALKPHOS 114 93 70  BILITOT 0.4 0.3 0.3  PROT 7.7 6.8 5.2*  ALBUMIN 3.9 3.3* 2.6*   CBC:  Recent Labs Lab 03/25/14 1544 03/28/14 1340 03/29/14 0550 03/30/14 1126  WBC 8.0 6.9 5.1 5.9  NEUTROABS 5.2 4.2  --   --  HGB 11.4* 9.5* 7.4* 8.2*  HCT 35.2* 29.4* 22.2* 24.5*  MCV 99.7 100.0 97.8 93.5  PLT 162 202 161 152   Cardiac Enzymes:  Recent Labs Lab 03/28/14 1835 03/29/14 0031 03/29/14 0550  TROPONINI <0.30 <0.30 <0.30   CBG:  Recent Labs Lab 03/29/14 0751 03/30/14 0746  GLUCAP 117* 90    Studies: Dg Chest Port 1 View  03/28/2014   CLINICAL DATA:  PICC placement  EXAM: PORTABLE CHEST - 1 VIEW  COMPARISON:  03/28/2014 at 1337 hr  FINDINGS: New right PICC has its tip in the lower superior vena cava, well positioned. No pneumothorax.  No other change from the prior study. No acute cardiopulmonary disease.  IMPRESSION: Right PICC is well positioned with its tip in the lower superior vena cava.   Electronically Signed   By: Lajean Manes M.D.   On: 03/28/2014 19:16    Scheduled Meds: . sodium chloride    Intravenous Once  . atorvastatin  40 mg Oral q1800  . cholecalciferol  1,000 Units Oral Daily  . diltiazem  120 mg Oral BID  . docusate sodium  100 mg Oral Daily  . fluticasone  2 puff Inhalation BID  . latanoprost  1 drop Both Eyes QHS  . linagliptin  5 mg Oral Daily  . methimazole  5 mg Oral Daily  . metoprolol tartrate  25 mg Oral TID  . pantoprazole (PROTONIX) IV  40 mg Intravenous Q12H  . sodium chloride  3 mL Intravenous Q12H   Continuous Infusions: . sodium chloride 10 mL/hr at 03/30/14 1229    Principal Problem:   GI bleed Active Problems:   Thyrotoxicosis   Diabetes mellitus with circulatory complication   Dyslipidemia   Essential hypertension   CAD (coronary artery disease) of artery bypass graft   ATRIAL FIBRILLATION, PAROXYSMAL   Intrinsic asthma   Acute blood loss anemia   Long term current use of anticoagulant therapy   Angiectasia  Time spent: 31min  Livia Tarr, [place] [facility] Pager [phone]. If 7PM-7AM, please contact night-coverage at [email or web address], password [facility] 03/30/2014, 7:04 PM  LOS: 2 days

## 2014-03-31 ENCOUNTER — Encounter (HOSPITAL_COMMUNITY): Payer: Self-pay | Admitting: Internal Medicine

## 2014-03-31 DIAGNOSIS — I998 Other disorder of circulatory system: Secondary | ICD-10-CM

## 2014-03-31 DIAGNOSIS — J4520 Mild intermittent asthma, uncomplicated: Secondary | ICD-10-CM

## 2014-03-31 LAB — GLUCOSE, CAPILLARY
GLUCOSE-CAPILLARY: 111 mg/dL — AB (ref 70–99)
Glucose-Capillary: 138 mg/dL — ABNORMAL HIGH (ref 70–99)
Glucose-Capillary: 123 mg/dL — ABNORMAL HIGH (ref 70–99)
Glucose-Capillary: 101 mg/dL — ABNORMAL HIGH (ref 70–99)

## 2014-03-31 LAB — PREPARE RBC (CROSSMATCH)

## 2014-03-31 LAB — CBC WITH DIFFERENTIAL/PLATELET
Basophils Absolute: 0 10*3/uL (ref 0.0–0.1)
Basophils Relative: 0 % (ref 0–1)
EOS ABS: 0.1 10*3/uL (ref 0.0–0.7)
Eosinophils Relative: 2 % (ref 0–5)
HCT: 24.4 % — ABNORMAL LOW (ref 36.0–46.0)
Hemoglobin: 7.9 g/dL — ABNORMAL LOW (ref 12.0–15.0)
LYMPHS ABS: 1.4 10*3/uL (ref 0.7–4.0)
Lymphocytes Relative: 24 % (ref 12–46)
MCH: 30.4 pg (ref 26.0–34.0)
MCHC: 32.4 g/dL (ref 30.0–36.0)
MCV: 93.8 fL (ref 78.0–100.0)
MONOS PCT: 13 % — AB (ref 3–12)
Monocytes Absolute: 0.8 10*3/uL (ref 0.1–1.0)
NEUTROS ABS: 3.6 10*3/uL (ref 1.7–7.7)
NEUTROS PCT: 61 % (ref 43–77)
PLATELETS: 147 10*3/uL — AB (ref 150–400)
RBC: 2.6 MIL/uL — AB (ref 3.87–5.11)
RDW: 16.7 % — ABNORMAL HIGH (ref 11.5–15.5)
WBC: 6 10*3/uL (ref 4.0–10.5)

## 2014-03-31 MED ORDER — SODIUM CHLORIDE 0.9 % IV SOLN
Freq: Once | INTRAVENOUS | Status: AC
  Administered 2014-03-31: 16:00:00 via INTRAVENOUS

## 2014-03-31 MED ORDER — FUROSEMIDE 10 MG/ML IJ SOLN
40.0000 mg | Freq: Once | INTRAMUSCULAR | Status: AC
  Administered 2014-03-31: 40 mg via INTRAVENOUS
  Filled 2014-03-31: qty 4

## 2014-03-31 MED ORDER — POTASSIUM CHLORIDE CRYS ER 20 MEQ PO TBCR
40.0000 meq | EXTENDED_RELEASE_TABLET | ORAL | Status: AC
  Administered 2014-03-31 (×2): 40 meq via ORAL
  Filled 2014-03-31 (×2): qty 2

## 2014-03-31 MED ORDER — PANTOPRAZOLE SODIUM 40 MG PO TBEC
40.0000 mg | DELAYED_RELEASE_TABLET | Freq: Every day | ORAL | Status: DC
  Administered 2014-04-01: 40 mg via ORAL
  Filled 2014-03-31: qty 1

## 2014-03-31 NOTE — Evaluation (Signed)
Physical Therapy Evaluation Patient Details Name: Rebecca Skinner MRN: [id number] DOB: [birth year] Today's Date: 03/31/2014   History of Present Illness  Pt is an 80 year old female admitted for lower GI bleed secondary to cecal AVM and s/p colonoscopy with control of bleeding on 10/21 with hx of OA, CVA in March, HTN, paroxysmal afib, CAD s/p CABG  Clinical Impression  Pt currently with functional limitations due to the deficits listed below (see PT Problem List).  Pt will benefit from skilled PT to increase their independence and safety with mobility to allow discharge to the venue listed below.  Pt reports hx of 17 falls since previous CVA.  Highly recommended to pt that she use her RW at home (typically holds onto walls and furniture) and also recommend initial supervision.  Pt would benefit from HHPT for home safety eval and balance training.     Follow Up Recommendations Home health PT;Supervision for mobility/OOB    Equipment Recommendations       Recommendations for Other Services       Precautions / Restrictions Precautions Precautions: Fall      Mobility  Bed Mobility Overal bed mobility: Needs Assistance Bed Mobility: Sit to Supine       Sit to supine: Supervision      Transfers Overall transfer level: Needs assistance Equipment used: Rolling walker (2 wheeled) Transfers: Sit to/from Stand Sit to Stand: Min guard         General transfer comment: vebral cues for hand placement  Ambulation/Gait Ambulation/Gait assistance: Min guard Ambulation Distance (Feet): 280 Feet Assistive device: Rolling walker (2 wheeled) Gait Pattern/deviations: Step-through pattern     General Gait Details: pt did well with ambulation until fatigued near end and increased pace with decreased safety requiring cues, pt reports mild SOB upon return to room however SpO2 100% and HR 95 bpm  Stairs            Wheelchair Mobility    Modified Rankin (Stroke Patients Only)        Balance Overall balance assessment: History of Falls (reports 17 falls)                                           Pertinent Vitals/Pain Pain Assessment: No/denies pain    Home Living Family/patient expects to be discharged to:: Private residence Living Arrangements: Other relatives (great grandson) Available Help at Discharge: Family;Available PRN/intermittently Type of Home: House       Home Layout: One level Home Equipment: Walker - 2 wheels      Prior Function Level of Independence: Independent with assistive device(s)         Comments: pt reports furniture walking at home and uses RW in community     [employer]        Extremity/Trunk Assessment               Lower Extremity Assessment: Overall WFL for tasks assessed         Communication   Communication: No difficulties  Cognition Arousal/Alertness: Awake/alert Behavior During Therapy: WFL for tasks assessed/performed Overall Cognitive Status: Within Functional Limits for tasks assessed                      General Comments      Exercises        Assessment/Plan    PT Assessment Patient  needs continued PT services  PT Diagnosis Difficulty walking   PT Problem List Decreased activity tolerance;Decreased balance;Decreased mobility;Decreased safety awareness  PT Treatment Interventions DME instruction;Gait training;Functional mobility training;Therapeutic activities;Therapeutic exercise;Patient/family education;Neuromuscular re-education;Balance training   PT Goals (Current goals can be found in the Care Plan section) Acute Rehab PT Goals PT Goal Formulation: With patient Time For Goal Achievement: 04/07/14 Potential to Achieve Goals: Good    Frequency Min 3X/week   Barriers to discharge        Co-evaluation               End of Session   Activity Tolerance: Patient limited by fatigue Patient left: in bed;with call bell/phone within  reach Nurse Communication: Mobility status         Time: 4097-3532 PT Time Calculation (min): 12 min   Charges:   PT Evaluation $Initial PT Evaluation Tier I: 1 Procedure PT Treatments $Gait Training: 8-22 mins   PT G Codes:          Rebecca Skinner,Rebecca Skinner 03/31/2014, 2:45 PM Rebecca Skinner, PT, DPT 03/31/2014 Pager: [phone]

## 2014-03-31 NOTE — Progress Notes (Signed)
    Progress Note   Subjective  feels okay. Two BMs without blood yesterday   Objective   Vital signs in last 24 hours: Temp:  [97.8 F (36.6 C)-98.5 F (36.9 C)] 98.2 F (36.8 C) (10/22 0457) Pulse Rate:  [50-86] 75 (10/22 0457) Resp:  [14-23] 14 (10/22 0457) BP: (125-191)/(57-88) 140/88 mmHg (10/22 0457) SpO2:  [94 %-100 %] 99 % (10/22 0858) Weight:  [164 lb 3.9 oz (74.5 kg)] 164 lb 3.9 oz (74.5 kg) (10/22 0500) Last BM Date: 03/30/14 General:    Pleasant black female in NAD Heart:  Occasional irregular beat, +murmur, Regular rate  Lungs: Respirations even and unlabored,Bilateral lower lobe rhonchi Abdomen:  Soft, nontender and nondistended. Normal bowel sounds. Extremities:  Without edema. Neurologic:  Alert and oriented,  grossly normal neurologically. Psych:  Cooperative. Normal mood and affect. Lab Results:  Recent Labs  03/29/14 0550 03/30/14 1126 03/31/14 0435  WBC 5.1 5.9 6.0  HGB 7.4* 8.2* 7.9*  HCT 22.2* 24.5* 24.4*  PLT 161 152 147*   BMET  Recent Labs  03/28/14 1340 03/29/14 0550  NA 141 148*  K 4.1 3.4*  CL 104 110  CO2 25 27  GLUCOSE 93 108*  BUN 19 16  CREATININE 1.00 0.87  CALCIUM 8.9 8.1*   LFT  Recent Labs  03/29/14 0550  PROT 5.2*  ALBUMIN 2.6*  AST 16  ALT 12  ALKPHOS 70  BILITOT 0.3   PT/INR  Recent Labs  03/28/14 1340 03/28/14 1628  LABPROT 18.4* 18.7*  INR 1.52* 1.55*    Studies/Results: colonoscopy ENDOSCOPIC IMPRESSION:  1. 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 Incidental left-sided diverticulosis  RECOMMENDATIONS:  1. Return to hospital and observe closely for any evidence of  recurrent bleeding. Monitor blood counts and vital signs. Transfuse  as clinically indicated.  2. Clear liquids for now  3. If the patient were to rebleed, could consider interventional  radiology directed embolization therapy . Discussed with family    Assessment / Plan:   76. 80 year old female with lower GI bleed secondary to cecal AVM, s/p colonoscopy with control of bleeding. No further bleeding. Will advance diet.  2. Anemia of acute blood loss, s/p one unit of blood 2 days ago. Hgb stable but low at 7.9. Hospitalist has ordered another unit of blood. She will likely go home tomorrow. Would recommend oral iron at discharge.     LOS: 3 days   Tye Savoy  03/31/2014, 9:11 AM   GI ATTENDING  Interval history and data reviewed. Patient seen and examined. Agree with progress note as outlined above. No further bleeding. Advance diet. If no further bleeding and stable blood counts tomorrow, may go home. Primary service or the patient's primary provider to decide on the importance of long-term anticoagulation. Would hold for a minimum of one week, however. We're available as needed. Will sign off. Thank you  Docia Chuck. Geri Seminole., M.D. [facility] Division of Gastroenterology

## 2014-03-31 NOTE — Progress Notes (Signed)
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  Follow GI recommendations Continue protonix 40 mg, but change to PO and daily.   Will transfuse 2 more units today, as Hgb is < than 8 (given hx of CAD). Has already received 1 unit of PRBC on 10/20.  BP remains stable.  Clear liquid diet tolerated; will advance diet as tolerated starting with full liquid  Thyrotoxicosis  Check TSH - normal Continue methimazole  Rate controlled with Cardizem and metoprolol. Continue monitoring on telemetry  And replete electrolytes  Diabetes mellitus with circulatory complication  Check Z0C - 6.1 Will continue home meds (tradjenta) and SSI  Dyslipidemia  Continue statin therapy   Essential hypertension  Continue Cardizem and metoprolol.   CAD (coronary artery disease) of artery bypass graft / ATRIAL FIBRILLATION, PAROXYSMAL / chest pain  No complaints of chest pain at this time.  Serial trop neg x 3  Last 2 D ECHO in 2014 with EF of 55%.  Eliquis and aspirin on hold due to GI bleed.  Intrinsic asthma  Continue inhalers.  No wheezing on exam  Code Status: Full Family Communication: no family at bedside Disposition Plan: to be determine    Consultants:  GI  Procedures:  Colonoscopy 10/21:  left diverticulosis Cecum AVM (with active bleeding)  Antibiotics:  none (indicate start date, and stop date if known)  HPI/Subjective: Awake in NAD; afebrile. tolerating CLD and asking to have diet advance. Had 2 BM w/o blood.  Objective: Filed Vitals:   03/31/14 0457 03/31/14 0500 03/31/14 0858 03/31/14 0922  BP: 140/88   150/69  Pulse: 75   77  Temp: 98.2 F (36.8 C)     TempSrc: Oral     Resp: 14     Height:       Weight:  74.5 kg (164 lb 3.9 oz)    SpO2: 100%  99%     Intake/Output Summary (Last 24 hours) at 03/31/14 1058 Last data filed at 03/31/14 0600  Gross per 24 hour  Intake 415.17 ml  Output      0 ml  Net 415.17 ml   Filed Weights   03/29/14 0500 03/30/14 0627 03/31/14 0500  Weight: 73 kg (160 lb 15 oz) 74.844 kg (165 lb) 74.5 kg (164 lb 3.9 oz)    Exam:   General:  Awake in NAD; afebrile. tolerating CLD and asking to have diet advance. Had 2 BM w/o blood.  Cardiovascular: regular, s1, s2; no rubs or gallops  Respiratory: normal resp effort, no wheezing, no crackles  Abdomen: soft, non-distended; positive BS  Musculoskeletal: perfused, no clubbing   Data Reviewed: Basic Metabolic Panel:  Recent Labs Lab 03/25/14 1544 03/28/14 1340 03/29/14 0550  NA 142 141 148*  K 4.2 4.1 3.4*  CL 101 104 110  CO2 28 25 27   GLUCOSE 100* 93 108*  BUN 18 19 16   CREATININE 1.00 1.00 0.87  CALCIUM 9.8 8.9 8.1*  MG  --  1.8  --   PHOS  --  3.7  --    Liver Function Tests:  Recent Labs Lab 03/25/14 1544 03/28/14 1340 03/29/14 0550  AST 25 21 16   ALT 18 15 12   ALKPHOS 114 93 70  BILITOT  0.4 0.3 0.3  PROT 7.7 6.8 5.2*  ALBUMIN 3.9 3.3* 2.6*   CBC:  Recent Labs Lab 03/25/14 1544 03/28/14 1340 03/29/14 0550 03/30/14 1126 03/31/14 0435  WBC 8.0 6.9 5.1 5.9 6.0  NEUTROABS 5.2 4.2  --   --  3.6  HGB 11.4* 9.5* 7.4* 8.2* 7.9*  HCT 35.2* 29.4* 22.2* 24.5* 24.4*  MCV 99.7 100.0 97.8 93.5 93.8  PLT 162 202 161 152 147*   Cardiac Enzymes:  Recent Labs Lab 03/28/14 1835 03/29/14 0031 03/29/14 0550  TROPONINI <0.30 <0.30 <0.30   CBG:  Recent Labs Lab 03/29/14 0751 03/30/14 0746 03/31/14 0752  GLUCAP 117* 90 111*    Studies: No results found.  Scheduled Meds: . sodium chloride   Intravenous Once  . sodium chloride   Intravenous Once  . atorvastatin  40 mg Oral q1800  . cholecalciferol  1,000 Units Oral Daily  . diltiazem  120 mg Oral BID  .  docusate sodium  100 mg Oral Daily  . fluticasone  2 puff Inhalation BID  . furosemide  40 mg Intravenous Once  . insulin aspart  0-9 Units Subcutaneous TID WC  . latanoprost  1 drop Both Eyes QHS  . linagliptin  5 mg Oral Daily  . methimazole  5 mg Oral Daily  . metoprolol tartrate  25 mg Oral TID  . pantoprazole (PROTONIX) IV  40 mg Intravenous Q12H  . potassium chloride  40 mEq Oral Q4H  . sodium chloride  3 mL Intravenous Q12H   Continuous Infusions: . sodium chloride 10 mL/hr at 03/30/14 1229    Principal Problem:   GI bleed Active Problems:   Thyrotoxicosis   Diabetes mellitus with circulatory complication   Dyslipidemia   Essential hypertension   CAD (coronary artery disease) of artery bypass graft   ATRIAL FIBRILLATION, PAROXYSMAL   Intrinsic asthma   Acute blood loss anemia   Long term current use of anticoagulant therapy   Angiectasia  Time spent: 86min  Shantinique Picazo, [place] [facility] Pager [phone]. If 7PM-7AM, please contact night-coverage at [email or web address], password [facility] 03/31/2014, 10:58 AM  LOS: 3 days

## 2014-04-01 DIAGNOSIS — R5381 Other malaise: Secondary | ICD-10-CM

## 2014-04-01 LAB — CBC
HEMATOCRIT: 34.2 % — AB (ref 36.0–46.0)
Hemoglobin: 11.6 g/dL — ABNORMAL LOW (ref 12.0–15.0)
MCH: 30.6 pg (ref 26.0–34.0)
MCHC: 33.9 g/dL (ref 30.0–36.0)
MCV: 90.2 fL (ref 78.0–100.0)
Platelets: 144 10*3/uL — ABNORMAL LOW (ref 150–400)
RBC: 3.79 MIL/uL — ABNORMAL LOW (ref 3.87–5.11)
RDW: 16.9 % — ABNORMAL HIGH (ref 11.5–15.5)
WBC: 7.9 10*3/uL (ref 4.0–10.5)

## 2014-04-01 LAB — TYPE AND SCREEN
ABO/RH(D): O POS
Antibody Screen: NEGATIVE
UNIT DIVISION: 0
UNIT DIVISION: 0
Unit division: 0

## 2014-04-01 LAB — GLUCOSE, CAPILLARY
GLUCOSE-CAPILLARY: 102 mg/dL — AB (ref 70–99)
GLUCOSE-CAPILLARY: 123 mg/dL — AB (ref 70–99)

## 2014-04-01 LAB — BASIC METABOLIC PANEL
Anion gap: 10 (ref 5–15)
BUN: 7 mg/dL (ref 6–23)
CHLORIDE: 106 meq/L (ref 96–112)
CO2: 25 mEq/L (ref 19–32)
Calcium: 9.1 mg/dL (ref 8.4–10.5)
Creatinine, Ser: 0.83 mg/dL (ref 0.50–1.10)
GFR calc Af Amer: 70 mL/min — ABNORMAL LOW (ref >90)
GFR calc non Af Amer: 61 mL/min — ABNORMAL LOW (ref >90)
Glucose, Bld: 103 mg/dL — ABNORMAL HIGH (ref 70–99)
POTASSIUM: 4.4 meq/L (ref 3.7–5.3)
Sodium: 141 mEq/L (ref 137–147)

## 2014-04-01 MED ORDER — ASPIRIN 81 MG PO TABS: 81.0000 mg | ORAL_TABLET | Freq: Every day | ORAL | Status: DC

## 2014-04-01 MED ORDER — APIXABAN 2.5 MG PO TABS: 2.5000 mg | ORAL_TABLET | Freq: Two times a day (BID) | ORAL | Status: DC

## 2014-04-01 MED ORDER — FERROUS SULFATE 324 (65 FE) MG PO TBEC
1.0000 | DELAYED_RELEASE_TABLET | Freq: Two times a day (BID) | ORAL | Status: DC
Start: 2014-04-01 — End: 2014-04-13

## 2014-04-01 MED ORDER — POLYETHYLENE GLYCOL 3350 17 G PO PACK: 17.0000 g | PACK | Freq: Every day | ORAL | Status: DC

## 2014-04-01 NOTE — Discharge Summary (Signed)
Physician Discharge Summary  Rebecca Skinner RWE:[id number] DOB: 1923/05/28 DOA: 03/28/2014  PCP: Kathlene November, MD  Admit date: 03/28/2014 Discharge date: 04/01/2014  Time spent: >30 minutes  Recommendations for Outpatient Follow-up:  Check CBC to follow Hgb trend Check BMET to follow electrolytes and renal function Reassess long term needs and risk for anticoagulation and resume if appropriate at that time  Discharge Diagnoses:  Principal Problem:   GI bleed Active Problems:   Thyrotoxicosis   Diabetes mellitus with circulatory complication   Dyslipidemia   Essential hypertension   CAD (coronary artery disease) of artery bypass graft   ATRIAL FIBRILLATION, PAROXYSMAL   Intrinsic asthma   Acute blood loss anemia   Long term current use of anticoagulant therapy   Angiectasia   Discharge Condition: stable; will discharge home with HHPT and follow up with PCP in 1 week  Diet recommendation: low sodium and low carb diet  Filed Weights   03/30/14 0627 03/31/14 0500 04/01/14 0552  Weight: 74.844 kg (165 lb) 74.5 kg (164 lb 3.9 oz) 74.2 kg (163 lb 9.3 oz)    History of present illness:  80 year old female with past medical history of CAD status post CABG, atrial fibrillation, status post AV replacement, on eliquis and aspirin, thyrotoxicosis, hypertension, dyslipidemia, diabetes who presented to [facility] ED 03/28/2014 with complaints of rectal bleed ongoing for past week or so. Patient reported painless bleed, bright red and dark red color with or without the bowel movement. No reports of abdominal pain nausea or vomiting. No fevers or chills. Patient was seen in urgent care few days ago for similar problem but decided to go home instead of being admitted for further work up. This morning she had an episode of chest pain in retrosternal area, non radiating, about 5/10 in intensity, sharp but has resolved spontaneously. No shortness of breath or palpitations. She had associated dizziness but no loss  of consciousness or fall.    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  Continue PPI.  Status post 3 units of PRBC's given during this admission; at discharge Hgb 11.6.  BP remains stable.   Thyrotoxicosis  Check TSH - normal  Continue methimazole  Rate controlled with Cardizem and metoprolol.  Continue monitoring on telemetry And replete electrolytes  Diabetes mellitus with circulatory complication  Check Y1P - 6.1  Will continue home meds (tjanuvia) and low carb diet  Dyslipidemia  Continue statin therapy   Essential hypertension  Continue Cardizem and metoprolol.   CAD (coronary artery disease) of artery bypass graft / ATRIAL FIBRILLATION, PAROXYSMAL / chest pain  No complaints of chest pain at this time.  Serial trop neg x 3  Last 2 D ECHO in 2014 with EF of 55%.  Eliquis and aspirin on hold due to GI bleed; will hold for 1 week and reassess needs/benefit and risks at that time for long term regimen.Marland Kitchen  Physical deconditioning  will arrange for PT at home  Intrinsic asthma  Continue inhalers.  No wheezing on exam  Procedures:  Colonoscopy 10/21:  -left diverticulosis  -Cecum AVM (with active bleeding)  Consultations:  GI   Discharge Exam: Filed Vitals:   04/01/14 1131  BP: 138/82  Pulse: 90  Temp:   Resp:    General: Awake in NAD; afebrile. Has had 3 BM w/o blood since colonoscopy done on 10/21.  Tolerating regular texture diet Cardiovascular: regular, s1, s2; no rubs or gallops  Respiratory: normal resp effort, no wheezing, no crackles  Abdomen: soft, non-distended; positive BS  Musculoskeletal: perfused, no clubbing   Discharge Instructions You were cared for by a hospitalist during your  hospital stay. If you have any questions about your discharge medications or the care you received while you were in the hospital after you are discharged, you can call the unit and asked to speak with the hospitalist on call if the hospitalist that took care of you is not available. Once you are discharged, your primary care physician will handle any further medical issues. Please note that NO REFILLS for any discharge medications will be authorized once you are discharged, as it is imperative that you return to your primary care physician (or establish a relationship with a primary care physician if you do not have one) for your aftercare needs so that they can reassess your need for medications and monitor your lab values.  Discharge Instructions   Diet - low sodium heart healthy    Complete by:  As directed      Discharge instructions    Complete by:  As directed   Take medications as prescribed Follow a low sodium and low carb diet Arrange follow up with PCP in 1 week Hold aspirin and Eliquis as instructed Check your weight on daily basis          Current Discharge Medication List    START taking these medications   Details  ferrous sulfate 324 (65 FE) MG TBEC Take 1 tablet (325 mg total) by mouth 2 (two) times daily. Qty: 60 tablet, Refills: 0    polyethylene glycol (MIRALAX / GLYCOLAX) packet Take 17 g by mouth daily. HOLD FOR DIARRHEA Qty: 14 each, Refills: 0      CONTINUE these medications which have CHANGED   Details  apixaban (ELIQUIS) 2.5 MG TABS tablet Take 1 tablet (2.5 mg total) by mouth 2 (two) times daily. HOLD FOR 1 WEEK; DO NOT RESUME UNTIL FOLLOW UP WITH PCP Qty: 60 tablet, Refills: 6    aspirin 81 MG tablet Take 1 tablet (81 mg total) by mouth daily. HOLD FOR AT LEAST 1 WEEK; DO NOT RESUME UNTIL FOLLOW UP WITH PCP Qty: 30 tablet      CONTINUE these medications which have NOT CHANGED   Details  acetaminophen (TYLENOL) 500 MG tablet Take 1,000 mg by mouth every  6 (six) hours as needed for moderate pain (pain).    albuterol (PROVENTIL HFA;VENTOLIN HFA) 108 (90 BASE) MCG/ACT inhaler Inhale 2 puffs into the lungs every 6 (six) hours as needed for wheezing or shortness of breath (wheezing).    ALPRAZolam (XANAX) 0.5 MG tablet Take 0.5 mg by mouth at bedtime as needed for anxiety or sleep (insomnia).     atorvastatin (LIPITOR) 40 MG tablet Take 40 mg by mouth daily at 6 PM.    beclomethasone (QVAR) 80 MCG/ACT inhaler Inhale 2 puffs into the lungs 2 (two) times daily.    benazepril (LOTENSIN) 20 MG tablet Take 20 mg by mouth daily.    Calcium Carbonate (CALCIUM 500 PO) Take 500 mg by mouth at bedtime.     cholecalciferol (VITAMIN D) 1000 UNITS tablet Take 1,000 Units by mouth daily.      diltiazem (DILACOR XR) 120 MG 24  hr capsule Take 120 mg by mouth 2 (two) times daily.    docusate sodium (COLACE) 100 MG capsule Take 1 capsule (100 mg total) by mouth daily. Qty: 30 capsule, Refills: 0    esomeprazole (NEXIUM) 40 MG capsule Take 1 capsule (40 mg total) by mouth daily before breakfast. Qty: 30 capsule, Refills: 3    furosemide (LASIX) 20 MG tablet Take 1 tablet (20 mg total) by mouth daily. Qty: 30 tablet, Refills: 6    glucose blood test strip 1 each by Other route as needed for other (blood sugar test). Use as instructed    methimazole (TAPAZOLE) 5 MG tablet Take 5 mg by mouth daily.    metoprolol tartrate (LOPRESSOR) 25 MG tablet Take 25 mg by mouth 3 (three) times daily.    nitroGLYCERIN (NITROSTAT) 0.4 MG SL tablet Place 0.4 mg under the tongue every 5 (five) minutes as needed for chest pain (chest pain).     sitaGLIPtin (JANUVIA) 50 MG tablet Take 50 mg by mouth daily.    TRAVATAN Z 0.004 % SOLN ophthalmic solution Place 1 drop into both eyes at bedtime.        Allergies  Allergen Reactions  . Hydrocodone     REACTION: itching/nausea  . Tramadol Hcl     REACTION: itching , upset stomach (08-2009)   Follow-up Information    Follow up with [facility])    Contact information:   [address] [place] [address] [phone]       Follow up with Kathlene November, MD. Schedule an appointment as soon as possible for a visit in 1 week.   Specialty:  Internal Medicine   Contact information:   [place] [id number] [phone]        The results of significant diagnostics from this hospitalization (including imaging, microbiology, ancillary and laboratory) are listed below for reference.    Significant Diagnostic Studies: Dg Chest Port 1 View  03/28/2014   CLINICAL DATA:  PICC placement  EXAM: PORTABLE CHEST - 1 VIEW  COMPARISON:  03/28/2014 at 1337 hr  FINDINGS: New right PICC has its tip in the lower superior vena cava, well positioned. No pneumothorax.  No other change from the prior study. No acute cardiopulmonary disease.  IMPRESSION: Right PICC is well positioned with its tip in the lower superior vena cava.   Electronically Signed   By: Lajean Manes M.D.   On: 03/28/2014 19:16   Dg Chest Portable 1 View  03/28/2014   CLINICAL DATA:  Initial encounter for shortness of breath  EXAM: PORTABLE CHEST - 1 VIEW  COMPARISON:  08/27/2013.  FINDINGS: 1337 hrs. The cardio pericardial silhouette is enlarged. No edema or focal airspace consolidation. There is slight nodularity to the central vascular anatomy of the left mid lung, probably projectional. No pneumothorax or pleural effusion. Imaged bony structures of the thorax are intact. Telemetry leads overlie the chest.  IMPRESSION: Cardiomegaly without pulmonary edema.  Nodular density in the right parahilar region is probably related to vascular anatomy. Followup short-term interval dedicated PA and lateral chest x-ray, after resolution of acute symptoms, is recommended to re-evaluate.   Electronically Signed   By: Misty Stanley M.D.   On: 03/28/2014 13:59   Labs: Basic Metabolic Panel:  Recent Labs Lab  03/25/14 1544 03/28/14 1340 03/29/14 0550 04/01/14 0435  NA 142 141 148* 141  K 4.2 4.1 3.4* 4.4  CL 101 104 110 106  CO2 28 25 27 25   GLUCOSE 100* 93 108* 103*  BUN 18 19 16 7   CREATININE 1.00 1.00 0.87 0.83  CALCIUM 9.8 8.9 8.1* 9.1  MG  --  1.8  --   --   PHOS  --  3.7  --   --    Liver Function Tests:  Recent Labs Lab 03/25/14 1544 03/28/14 1340 03/29/14 0550  AST 25 21 16   ALT 18 15 12   ALKPHOS 114 93 70  BILITOT 0.4 0.3 0.3  PROT 7.7 6.8 5.2*  ALBUMIN 3.9 3.3* 2.6*   CBC:  Recent Labs Lab 03/25/14 1544 03/28/14 1340 03/29/14 0550 03/30/14 1126 03/31/14 0435 04/01/14 0435  WBC 8.0 6.9 5.1 5.9 6.0 7.9  NEUTROABS 5.2 4.2  --   --  3.6  --   HGB 11.4* 9.5* 7.4* 8.2* 7.9* 11.6*  HCT 35.2* 29.4* 22.2* 24.5* 24.4* 34.2*  MCV 99.7 100.0 97.8 93.5 93.8 90.2  PLT 162 202 161 152 147* 144*   Cardiac Enzymes:  Recent Labs Lab 03/28/14 1835 03/29/14 0031 03/29/14 0550  TROPONINI <0.30 <0.30 <0.30   CBG:  Recent Labs Lab 03/31/14 1148 03/31/14 1620 03/31/14 2104 04/01/14 0754 04/01/14 1121  GLUCAP 138* 123* 101* 102* 123*    Signed:  Barton Dubois  [facility] 04/01/2014, 1:13 PM

## 2014-04-06 ENCOUNTER — Telehealth: Payer: Self-pay | Admitting: Internal Medicine

## 2014-04-06 NOTE — Telephone Encounter (Signed)
Was recently discharge from the hospital, she needs to be seen within few days. I see that she has an appointment 04/18/2014, please get an earlier appointment if the patient can make it.

## 2014-04-08 NOTE — Telephone Encounter (Signed)
Admit date: 03/28/2014  Discharge date: 04/01/2014  Reason for admission:  Acute GI Bleed  Spoke with patient's daughter, Lesia and rescheduled appointment to Wednesday, November 4th at 11:30 am with Dr. Larose Kells.  During call, daughter states today mother has been experiencing abdominal cramping.  She is currently in the bathroom.  Daughter is there with her.  Daughter is unaware of whether or not there has been a reoccurrence of rectal bleed.  Daughter states that she will check with her mother and call back.

## 2014-04-11 NOTE — Telephone Encounter (Signed)
Called No answer.

## 2014-04-13 ENCOUNTER — Ambulatory Visit (INDEPENDENT_AMBULATORY_CARE_PROVIDER_SITE_OTHER): Payer: Medicare Other | Admitting: Internal Medicine

## 2014-04-13 ENCOUNTER — Encounter: Payer: Self-pay | Admitting: Internal Medicine

## 2014-04-13 ENCOUNTER — Other Ambulatory Visit: Payer: Self-pay | Admitting: Cardiovascular Disease

## 2014-04-13 VITALS — BP 118/64 | HR 77 | Temp 98.0°F | Wt 164.4 lb

## 2014-04-13 DIAGNOSIS — I25810 Atherosclerosis of coronary artery bypass graft(s) without angina pectoris: Secondary | ICD-10-CM

## 2014-04-13 DIAGNOSIS — I480 Paroxysmal atrial fibrillation: Secondary | ICD-10-CM

## 2014-04-13 DIAGNOSIS — K921 Melena: Secondary | ICD-10-CM

## 2014-04-13 DIAGNOSIS — I10 Essential (primary) hypertension: Secondary | ICD-10-CM

## 2014-04-13 LAB — CBC WITH DIFFERENTIAL/PLATELET
BASOS PCT: 0.3 % (ref 0.0–3.0)
Basophils Absolute: 0 10*3/uL (ref 0.0–0.1)
EOS PCT: 2.6 % (ref 0.0–5.0)
Eosinophils Absolute: 0.2 10*3/uL (ref 0.0–0.7)
HCT: 41.2 % (ref 36.0–46.0)
Hemoglobin: 13.1 g/dL (ref 12.0–15.0)
LYMPHS ABS: 1.3 10*3/uL (ref 0.7–4.0)
LYMPHS PCT: 17.3 % (ref 12.0–46.0)
MCHC: 31.9 g/dL (ref 30.0–36.0)
MCV: 95.1 fl (ref 78.0–100.0)
MONOS PCT: 9.8 % (ref 3.0–12.0)
Monocytes Absolute: 0.7 10*3/uL (ref 0.1–1.0)
Neutro Abs: 5.1 10*3/uL (ref 1.4–7.7)
Neutrophils Relative %: 70 % (ref 43.0–77.0)
Platelets: 166 10*3/uL (ref 150.0–400.0)
RBC: 4.33 Mil/uL (ref 3.87–5.11)
RDW: 17 % — ABNORMAL HIGH (ref 11.5–15.5)
WBC: 7.3 10*3/uL (ref 4.0–10.5)

## 2014-04-13 NOTE — Progress Notes (Signed)
Pre visit review using our clinic review tool, if applicable. No additional management support is needed unless otherwise documented below in the visit note. 

## 2014-04-13 NOTE — Patient Instructions (Signed)
Get your blood work before you leave   Go back on a baby aspirin daily Continue holding ELIQUIS until you see your heart doctor, please call them and make an appointment   Please come back to the office in 2 months  for a routine check up

## 2014-04-13 NOTE — Telephone Encounter (Signed)
Pt has an appointment today at 11:30 am.  She denied rectal bleeding, abdominal cramping, and pain.  She states that has been feeling tired and weak. Pt was assured that we would assess this during her visit today.    Recommendations for Outpatient Follow-up:  Check CBC to follow Hgb trend Check BMET to follow electrolytes and renal function Reassess long term needs and risk for anticoagulation and resume if appropriate at that time

## 2014-04-13 NOTE — Progress Notes (Signed)
Subjective:    Patient ID: Rebecca Skinner, female    DOB: 01/13/1923, 80 y.o.   MRN: [id number]  DOS:  04/13/2014 Type of visit - description : Hospital follow-up, chart reviewed, excerpts:  Admit date: 03/28/2014 Discharge date: 04/01/2014   Recommendations for Outpatient Follow-up:  Check CBC to follow Hgb trend Check BMET to follow electrolytes and renal function Reassess long term needs and risk for anticoagulation and resume if appropriate at that time    History of present illness:    presented to [facility] ED 03/28/2014 with complaints of rectal bleed ongoing for past week or so. Patient reported painless bleed, bright red and dark red color with or without the bowel movement.   This morning she had an episode of chest pain in retrosternal area, non radiating, about 5/10 in intensity, sharp but has resolved spontaneously. No shortness of breath or palpitations. She had associated dizziness but no loss of consciousness or fall.    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   Continue PPI.   Status post 3 units of PRBC's given during this admission; at discharge Hgb 11.6.   BP remains stable.  Thyrotoxicosis   Check TSH - normal   Continue methimazole    Diabetes mellitus with circulatory complication   Check E7M - 6.1   Will continue home meds (tjanuvia) and low carb diet   CAD    No complaints of chest pain at this time.   Serial trop neg x 3   Last 2 D ECHO in 2014 with EF of 55%.   Eliquis and aspirin on hold due to GI bleed; will hold for 1 week and reassess needs/benefit and risks at that time for long term regimen.Marland Kitchen  Physical deconditioning  will arrange for PT at home  Intrinsic asthma    Continue inhalers.   No wheezing on exam  Procedures:  Colonoscopy 10/21: -left diverticulosis  -Cecum AVM (with active bleeding)  Consultations:  GI   ROS Since she left the hospital she is feeling well, last week had nausea and episode of diarrhea but otherwise asymptomatic. No further blood in the stools. Doing physical therapy at home, they are recommending a lift chair Appetite is a slightly decreased but she is tolerating food well. No chest pain or difficulty breathing No anxiety or depression  Past Medical History  Diagnosis Date  . Asthma   . Diabetes mellitus   . Hypertension   . Hyperlipidemia   . CAD (coronary artery disease)     s/p CABG s/p AO valve replacement ([facility])  . Recurrent UTI   . Paroxysmal atrial fibrillation     cards d/c coumadin 12/2008 d/t persisten NSR and frequent falls- restarted coumadin july 2011, now on Eliquis  . Hyperthyroidism   . Dry skin   . Osteoarthritis   . Osteopenia     per dexa 12/09  . Keloid     @ chest  . Dizziness     Chronic, admiet 07-2010,saw neuro, thought to be a peripheral issue   . Anxiety 11/20/2011  . CVA (cerebral infarction) 08-2013    multiple, L, d/t Afib, started eloquis  . Memory loss  Past Surgical History  Procedure Laterality Date  . Abdominal hysterectomy    . Total knee arthroplasty  1999  . Oophorectomy    . Cagb  1998  . Cesarean section      x 2  . Hemorrhoid surgery    . Colonoscopy with propofol N/A 03/30/2014    Procedure: COLONOSCOPY WITH PROPOFOL;  Surgeon: Irene Shipper, MD;  Location: [facility];  Service: Endoscopy;  Laterality: N/A;    History   Social History  . Marital Status: Divorced    Spouse Name: N/A    Number of Children: 24  . Years of Education: N/A   Occupational History  . retired     Social History Main Topics  . Smoking status: Former Research scientist (life sciences)  . Smokeless tobacco: Never Used     Comment: quit 2004, smoked 1.5 ppd  . Alcohol Use: No    . Drug Use: No  . Sexual Activity: Not on file   Other Topics Concern  . Not on file   Social History Narrative   Back living at her house since the last visit, GGson lives w/ her (78 y/o)   Had 3 daughter- 2 son  (lost oldest daughter and son), 2 living daughters in [place]        Medication List       This list is accurate as of: 04/13/14 12:05 PM.  Always use your most recent med list.               acetaminophen 500 MG tablet  Commonly known as:  TYLENOL  Take 1,000 mg by mouth every 6 (six) hours as needed for moderate pain (pain).     albuterol 108 (90 BASE) MCG/ACT inhaler  Commonly known as:  PROVENTIL HFA;VENTOLIN HFA  Inhale 2 puffs into the lungs every 6 (six) hours as needed for wheezing or shortness of breath (wheezing).     ALPRAZolam 0.5 MG tablet  Commonly known as:  XANAX  Take 0.5 mg by mouth at bedtime as needed for anxiety or sleep (insomnia).     apixaban 2.5 MG Tabs tablet  Commonly known as:  ELIQUIS  Take 1 tablet (2.5 mg total) by mouth 2 (two) times daily. HOLD FOR 1 WEEK; DO NOT RESUME UNTIL FOLLOW UP WITH PCP     aspirin 81 MG tablet  Take 1 tablet (81 mg total) by mouth daily. HOLD FOR AT LEAST 1 WEEK; DO NOT RESUME UNTIL FOLLOW UP WITH PCP     atorvastatin 40 MG tablet  Commonly known as:  LIPITOR  Take 40 mg by mouth daily at 6 PM.     benazepril 20 MG tablet  Commonly known as:  LOTENSIN  Take 20 mg by mouth daily.     CALCIUM 500 PO  Take 500 mg by mouth at bedtime.     cholecalciferol 1000 UNITS tablet  Commonly known as:  VITAMIN D  Take 1,000 Units by mouth daily.     diltiazem 120 MG 24 hr capsule  Commonly known as:  DILACOR XR  Take 120 mg by mouth 2 (two) times daily.     docusate sodium 100 MG capsule  Commonly known as:  COLACE  Take 1 capsule (100 mg total) by mouth daily.     dorzolamide-timolol 22.3-6.8 MG/ML ophthalmic solution  Commonly known as:  COSOPT     esomeprazole 40 MG capsule  Commonly  known as:  NEXIUM  Take 1 capsule (40 mg total) by mouth daily  before breakfast.     ferrous sulfate 324 (65 FE) MG Tbec  Take 1 tablet (325 mg total) by mouth 2 (two) times daily.     furosemide 20 MG tablet  Commonly known as:  LASIX  Take 1 tablet (20 mg total) by mouth daily.     glucose blood test strip  1 each by Other route as needed for other (blood sugar test). Use as instructed     methimazole 5 MG tablet  Commonly known as:  TAPAZOLE  Take 5 mg by mouth daily. Takes Monday, Wednesday, and Friday only     metoprolol tartrate 25 MG tablet  Commonly known as:  LOPRESSOR  Take 25 mg by mouth 2 (two) times daily.     nitroGLYCERIN 0.4 MG SL tablet  Commonly known as:  NITROSTAT  Place 0.4 mg under the tongue every 5 (five) minutes as needed for chest pain (chest pain).     ONETOUCH DELICA LANCETS 98Y Misc     polyethylene glycol packet  Commonly known as:  MIRALAX / GLYCOLAX  Take 17 g by mouth daily. HOLD FOR DIARRHEA     QVAR 80 MCG/ACT inhaler  Generic drug:  beclomethasone  Inhale 2 puffs into the lungs 2 (two) times daily.     sitaGLIPtin 50 MG tablet  Commonly known as:  JANUVIA  Take 50 mg by mouth daily.     TRAVATAN Z 0.004 % Soln ophthalmic solution  Generic drug:  Travoprost (BAK Free)  Place 1 drop into both eyes at bedtime.           Objective:   Physical Exam BP 118/64 mmHg  Pulse 77  Temp(Src) 98 F (36.7 C) (Oral)  Wt 164 lb 6 oz (74.56 kg)  SpO2 97%  General -- alert, well-developed, walks with difficulty, uses a walker, seems to be back to baseline.   Lungs -- normal respiratory effort, no intercostal retractions, no accessory muscle use, and normal breath sounds.  Heart-- irreg , sof murmur.  Abdomen-- Not distended, good bowel sounds,soft, non-tender. Neurologic--  alert & oriented X3.   Psych-- Cognition and judgment appear intact. Cooperative with normal attention span and concentration. No anxious or depressed appearing.        Assessment & Plan:   Debility, doing PT at home, prescribe a lift chair.

## 2014-04-14 ENCOUNTER — Telehealth: Payer: Self-pay | Admitting: *Deleted

## 2014-04-14 LAB — BASIC METABOLIC PANEL
BUN: 12 mg/dL (ref 6–23)
CALCIUM: 8.9 mg/dL (ref 8.4–10.5)
CHLORIDE: 105 meq/L (ref 96–112)
CO2: 25 meq/L (ref 19–32)
CREATININE: 1 mg/dL (ref 0.4–1.2)
GFR: 65.58 mL/min (ref >60.00)
GLUCOSE: 76 mg/dL (ref 70–99)
Potassium: 4 mEq/L (ref 3.5–5.1)
Sodium: 141 mEq/L (ref 135–145)

## 2014-04-14 LAB — FERRITIN: FERRITIN: 31.6 ng/mL (ref 10.0–291.0)

## 2014-04-14 LAB — IRON: Iron: 119 ug/dL (ref 42–145)

## 2014-04-14 NOTE — Assessment & Plan Note (Signed)
CAD,status post large GI bleed, now asymptomatic. Currently with no chest pain, reassume  Aspirin

## 2014-04-14 NOTE — Assessment & Plan Note (Signed)
HTN, BP satisfactory, check a BMP

## 2014-04-14 NOTE — Assessment & Plan Note (Signed)
GI bleed, Status post admission due to a GI bleed felt to be d/t a AVM. Status post 3 PRBCs Seems to be doing well. She is not taking iron. Plan: CBC, check iron and ferritin.

## 2014-04-14 NOTE — Telephone Encounter (Signed)
Received home health certification and plan of care via fax from [facility]. Forms forwarded to Dr. Larose Kells. JG//CMA

## 2014-04-14 NOTE — Telephone Encounter (Signed)
Rx refill sent to patient pharmacy   

## 2014-04-14 NOTE — Assessment & Plan Note (Signed)
Atrial fibrillation, Currently holding eliquis, pro-cons of this medication discussed. She was recently admited w/ a large GI  bleeding, I recommend not to start Eliquis  at this time but at the same time recommend to discuss with cardiology. Rate well-controlled, taking metoprolol only twice a day (not tid)

## 2014-04-15 ENCOUNTER — Other Ambulatory Visit: Payer: Self-pay | Admitting: Cardiovascular Disease

## 2014-04-17 ENCOUNTER — Other Ambulatory Visit: Payer: Self-pay | Admitting: Internal Medicine

## 2014-04-18 ENCOUNTER — Ambulatory Visit: Payer: Medicare Other | Admitting: Internal Medicine

## 2014-04-18 DIAGNOSIS — J45909 Unspecified asthma, uncomplicated: Secondary | ICD-10-CM

## 2014-04-18 DIAGNOSIS — E1140 Type 2 diabetes mellitus with diabetic neuropathy, unspecified: Secondary | ICD-10-CM

## 2014-04-18 DIAGNOSIS — E0590 Thyrotoxicosis, unspecified without thyrotoxic crisis or storm: Secondary | ICD-10-CM

## 2014-04-18 DIAGNOSIS — K922 Gastrointestinal hemorrhage, unspecified: Secondary | ICD-10-CM

## 2014-04-18 NOTE — Telephone Encounter (Signed)
Completed forms faxed to [facility]. JG//CMA

## 2014-04-21 ENCOUNTER — Other Ambulatory Visit: Payer: Self-pay | Admitting: Internal Medicine

## 2014-05-02 ENCOUNTER — Other Ambulatory Visit: Payer: Self-pay

## 2014-05-12 ENCOUNTER — Encounter: Payer: Self-pay | Admitting: Cardiovascular Disease

## 2014-05-12 ENCOUNTER — Ambulatory Visit (INDEPENDENT_AMBULATORY_CARE_PROVIDER_SITE_OTHER): Payer: Medicare Other | Admitting: Cardiovascular Disease

## 2014-05-12 VITALS — BP 132/64 | HR 66 | Ht 62.0 in | Wt 163.9 lb

## 2014-05-12 DIAGNOSIS — Z79899 Other long term (current) drug therapy: Secondary | ICD-10-CM

## 2014-05-12 DIAGNOSIS — I482 Chronic atrial fibrillation, unspecified: Secondary | ICD-10-CM

## 2014-05-12 DIAGNOSIS — E785 Hyperlipidemia, unspecified: Secondary | ICD-10-CM

## 2014-05-12 DIAGNOSIS — I4891 Unspecified atrial fibrillation: Secondary | ICD-10-CM

## 2014-05-12 DIAGNOSIS — D62 Acute posthemorrhagic anemia: Secondary | ICD-10-CM

## 2014-05-12 DIAGNOSIS — I998 Other disorder of circulatory system: Secondary | ICD-10-CM

## 2014-05-12 DIAGNOSIS — I10 Essential (primary) hypertension: Secondary | ICD-10-CM

## 2014-05-12 MED ORDER — CLOPIDOGREL BISULFATE 75 MG PO TABS: 75.0000 mg | ORAL_TABLET | Freq: Every day | ORAL | Status: DC

## 2014-05-12 NOTE — Patient Instructions (Signed)
Your physician has recommended you make the following change in your medication: START THE PRESCRIPTION FOR CLOPIDOGREL. This has already been sent to your pharmacy.  Your physician recommends that you return for lab work in: 1 week.  Your physician recommends that you schedule a follow-up appointment in: 3 months.

## 2014-05-12 NOTE — Progress Notes (Signed)
Patient ID: Rebecca Skinner, female   DOB: 1923/03/04, 79 y.o.   MRN: [id number]      HPI: [name] is an 80 year old female who  established cardiology care with me in February 2015. She is a former patient of Dr. Rollene Fare.  She presents for an 8 month follow-up cardiology evaluation.  [name] underwent a pericardial aortic valve replacement in 1998 and single vessel bypass with a vein graft to the right artery artery. She also has a history of sick sinus syndrome with paroxysmal atrial fibrillation and was taken off Coumadin anticoagulation in the past due to multiple falls in 2009 in 2010. There is no history of CVA or TIAs. She does have significant arthritic issues with back discomfort and also arthritis of her knees. She last saw Dr. Rollene Fare one year ago and at that time, she was in a normal sinus rhythm.  Earlier this year, [name] had noticed increasing palpitations as well as chest fluttering. She has significant shortness of breath almost all the time. She currently is living with her grandson. She does note some wheezing. Her last nuclear perfusion study was in 2011 which was normal. Her last echo Doppler study was in 2013 which showed moderate asymmetric LVH with septal wall at 1.6 cm and posterior wall 1.1 cm of the sigmoid septum. Ejection fraction was greater than 55%. She had mild to moderate mitral regurgitation, mild to moderate tricuspid regurgitation with elevation of RV systolic pressure at 46 mm. There bioprosthetic aortic valve had a peak gradient of 17 and mean gradient of 8 within normal limits for this valve. On 08/12/2013 she underwent a nuclear perfusion study which was interpreted as intermediate risk with it suggested a fixed lateral defect with minimal peri-infarction ischemia. A CardioNet monitor had shown atrial fibrillation with bursts of increased heart rate.  She walks with a walker. She has undergone left knee replacement. She has history of diabetes mellitus as well  as hypertension. She had been on dual antiplatelet therapy with aspirin and Plavix. She was hospitalized on on the neurology service from 3/20 - 242015 with CVA. Her symptoms have resolved. Carotid studies demonstrated mild internal carotid stenoses of less than 40%. During that evaluation, she apparently was taken off aspirin and Plavix by Dr. [name] and was given a prescription for Eliquis  5 mg since was felt that her CVA was due to atrial fibrillation.  Since I last saw her, she presented to [facility] long emergency room on 03/28/2014 with complaints of rectal bleeding for approximately a week.  She was found to have significant anemia with a hemoglobin of 7.4 and hematocrit of 22.2.  She was treated with 3 units of packed red blood cells during her admission.  Eliquis and aspirin were held, and she is not been on this since.  Most recently she has been restarted back on aspirin.  She underwent colonoscopy and was found to have an AVM in the cecum area, which was felt to be the cause of her lower extremity bleeding.  She also has diabetes mellitus, as well as thyroid disease.  She presents for Cardiologic evaluation.  Past Medical History  Diagnosis Date  . Asthma   . Diabetes mellitus   . Hypertension   . Hyperlipidemia   . CAD (coronary artery disease)     s/p CABG s/p AO valve replacement ([place] CV)  . Recurrent UTI   . Paroxysmal atrial fibrillation     cards d/c coumadin 12/2008 d/t persisten  NSR and frequent falls- restarted coumadin july 2011, now on Eliquis  . Hyperthyroidism   . Dry skin   . Osteoarthritis   . Osteopenia     per dexa 12/09  . Keloid     @ chest  . Dizziness     Chronic, admiet 07-2010,saw neuro, thought to be a peripheral issue   . Anxiety 11/20/2011  . CVA (cerebral infarction) 08-2013    multiple, L, d/t Afib, started eloquis  . Memory loss     Past Surgical History  Procedure Laterality Date  . Abdominal hysterectomy    . Total knee arthroplasty   1999  . Oophorectomy    . Cagb  1998  . Cesarean section      x 2  . Hemorrhoid surgery    . Colonoscopy with propofol N/A 03/30/2014    Procedure: COLONOSCOPY WITH PROPOFOL;  Surgeon: Irene Shipper, MD;  Location: [facility] ENDOSCOPY;  Service: Endoscopy;  Laterality: N/A;    Allergies  Allergen Reactions  . Hydrocodone     REACTION: itching/nausea  . Tramadol Hcl     REACTION: itching , upset stomach (08-2009)    Current Outpatient Prescriptions  Medication Sig Dispense Refill  . acetaminophen (TYLENOL) 500 MG tablet Take 1,000 mg by mouth every 6 (six) hours as needed for moderate pain (pain).    Marland Kitchen albuterol (PROVENTIL HFA;VENTOLIN HFA) 108 (90 BASE) MCG/ACT inhaler Inhale 2 puffs into the lungs every 6 (six) hours as needed for wheezing or shortness of breath (wheezing).    . ALPRAZolam (XANAX) 0.5 MG tablet Take 0.5 mg by mouth at bedtime as needed for anxiety or sleep (insomnia).     Marland Kitchen aspirin 81 MG tablet Take 81 mg by mouth daily.    Marland Kitchen atorvastatin (LIPITOR) 40 MG tablet TAKE 1 TABLET BY MOUTH AT BEDTIME 30 tablet 3  . beclomethasone (QVAR) 80 MCG/ACT inhaler Inhale 2 puffs into the lungs 2 (two) times daily.    . benazepril (LOTENSIN) 20 MG tablet Take 20 mg by mouth daily.    . Calcium Carbonate (CALCIUM 500 PO) Take 500 mg by mouth at bedtime.     . cholecalciferol (VITAMIN D) 1000 UNITS tablet Take 1,000 Units by mouth daily.      Marland Kitchen diltiazem (CARDIZEM CD) 120 MG 24 hr capsule TAKE 1 CAPSULE (120 MG TOTAL) BY MOUTH DAILY. 30 capsule 3  . docusate sodium (COLACE) 100 MG capsule Take 1 capsule (100 mg total) by mouth daily. 30 capsule 0  . dorzolamide-timolol (COSOPT) 22.3-6.8 MG/ML ophthalmic solution     . esomeprazole (NEXIUM) 40 MG capsule Take 1 capsule (40 mg total) by mouth daily before breakfast. 30 capsule 3  . furosemide (LASIX) 20 MG tablet TAKE 1 TABLET (20 MG TOTAL) BY MOUTH DAILY. 30 tablet 5  . glucose blood test strip 1 each by Other route as needed for other (blood  sugar test). Use as instructed    . methimazole (TAPAZOLE) 5 MG tablet Take 5 mg by mouth daily. Takes Monday, Wednesday, and Friday only    . metoprolol tartrate (LOPRESSOR) 25 MG tablet Take 25 mg by mouth 2 (two) times daily.     . nitroGLYCERIN (NITROSTAT) 0.4 MG SL tablet Place 0.4 mg under the tongue every 5 (five) minutes as needed for chest pain (chest pain).     Glory Rosebush DELICA LANCETS 27C MISC     . polyethylene glycol (MIRALAX / GLYCOLAX) packet Take 17 g by mouth daily. HOLD FOR DIARRHEA  14 each 0  . sitaGLIPtin (JANUVIA) 50 MG tablet Take 50 mg by mouth daily.    . TRAVATAN Z 0.004 % SOLN ophthalmic solution Place 1 drop into both eyes at bedtime.     . [DISCONTINUED] potassium chloride (KLOR-CON 10) 10 MEQ CR tablet Take 1 tablet (10 mEq total) by mouth daily. 10 tablet 0   No current facility-administered medications for this visit.    Socially she is widowed. She lives with her grandson. She has 2 deceased children, 4 children alive as well as 4 grandchildren 3 great-grandchildren. There is no tobacco use.  Family History  Problem Relation Age of Onset  . Colon cancer Neg Hx   . Breast cancer Neg Hx   . Thyroid disease Neg Hx   . Coronary artery disease Son 18    deceased  . Heart attack Father   . Hypertension Brother   . Stroke Brother    FH is noteworthy in that her father suffered an MI at age 19. Her mother died at childbirth.  ROS General: Negative; No fevers, chills, or night sweats;  HEENT: Negative; No changes in vision or hearing, sinus congestion, difficulty swallowing Pulmonary: Negative; No cough, wheezing, shortness of breath, hemoptysis Cardiovascular: Negative; No chest pain, presyncope, syncope, palpitations GI: Recent GI blood loss requiring 3 unit packed red blood cell transfusion GU: Negative; No dysuria, hematuria, or difficulty voiding Musculoskeletal: Negative; no myalgias, joint pain, or weakness Hematologic/Oncology: Negative; no easy  bruising, bleeding Endocrine: Negative; no heat/cold intolerance; no diabetes Neuro: Positive CVA March 2015 resolution of symptoms Skin: Negative; No rashes or skin lesions Psychiatric: Negative; No behavioral problems, depression Sleep: Negative; No snoring, daytime sleepiness, hypersomnolence, bruxism, restless legs, hypnogognic hallucinations, no cataplexy Other comprehensive 14 point system review is negative.  PE BP 132/64 mmHg  Pulse 66  Ht 5\' 2"  (1.575 m)  Wt 163 lb 14.4 oz (74.345 kg)  BMI 29.97 kg/m2 General: Alert, oriented, no distress.  Skin: normal turgor, no rashes HEENT: Normocephalic, atraumatic. There is significant arcus senilis.  Pupils round and reactive; sclera anicteric; extraocular muscles intact; Fundi arteriolar narrowing. Nose without nasal septal hypertrophy Mouth/Parynx benign; Mallinpatti scale 2 Neck: No JVD, possible soft carotid bruits versus transmitted murmur; normal carotid upstroke Lungs: clear to ausculatation and percussion; no wheezing or rales Chest wall: without tenderness to palpitation  Heart: Irregular regular rhythm with a fairly controlled ventricular response in the 70s., s1 s2 normal; 2/6 systolic murmur at the right and left sternal border. No diastolic murmur. No rubs thrills or heaves Abdomen: soft, nontender; no hepatosplenomehaly, BS+; abdominal aorta nontender and not dilated by palpation. Back: no CVA tenderness Pulses 2+ Extremities: no clubbinbg cyanosis or edema, Homan's sign negative  Neurologic: grossly nonfocal; Cranial nerves grossly wnl Psychologic: Normal mood and affect  ECG (Independently read by me): Atrial fibrillation at 66 bpm.  Nonspecific ST changes.  April 2015 ECG (Independently read by me): Atrial fibrillation at 76 beats per minute. QTc interval 445  Prior ECG (independently read by me): Atrial flutter with variable block at approximately 65 beats per minute  LABS:  BMET    Component Value Date/Time     NA 141 04/13/2014 1232   K 4.0 04/13/2014 1232   CL 105 04/13/2014 1232   CO2 25 04/13/2014 1232   GLUCOSE 76 04/13/2014 1232   BUN 12 04/13/2014 1232   CREATININE 1.0 04/13/2014 1232   CREATININE 0.98 08/12/2013 0840   CALCIUM 8.9 04/13/2014 1232   GFRNONAA 61*  04/01/2014 0435   GFRAA 70* 04/01/2014 0435     Hepatic Function Panel     Component Value Date/Time   PROT 5.2* 03/29/2014 0550   ALBUMIN 2.6* 03/29/2014 0550   AST 16 03/29/2014 0550   ALT 12 03/29/2014 0550   ALKPHOS 70 03/29/2014 0550   BILITOT 0.3 03/29/2014 0550   BILIDIR 0.1 06/27/2010 1415   IBILI 0.3 06/27/2010 1415     CBC    Component Value Date/Time   WBC 7.3 04/13/2014 1232   RBC 4.33 04/13/2014 1232   HGB 13.1 04/13/2014 1232   HCT 41.2 04/13/2014 1232   PLT 166.0 04/13/2014 1232   MCV 95.1 04/13/2014 1232   MCH 30.6 04/01/2014 0435   MCHC 31.9 04/13/2014 1232   RDW 17.0* 04/13/2014 1232   LYMPHSABS 1.3 04/13/2014 1232   MONOABS 0.7 04/13/2014 1232   EOSABS 0.2 04/13/2014 1232   BASOSABS 0.0 04/13/2014 1232     BNP No results found for: PROBNP  Lipid Panel     Component Value Date/Time   CHOL 111 08/28/2013 1824   TRIG 77 08/28/2013 1824   HDL 36* 08/28/2013 1824   CHOLHDL 3.1 08/28/2013 1824   VLDL 15 08/28/2013 1824   LDLCALC 60 08/28/2013 1824     RADIOLOGY: No results found.   ASSESSMENT AND PLAN:  [name] is an 80 year old African American female who is 17 years status post aortic valve replacement with a pericardial tissue valve at which time she underwent single-vessel bypass with a saphenous vein graft placed to her RCA. A  nuclear perfusion study showed a lateral defect with minimal peri-infarction ischemia was not felt to be high risk. She has a history of atrial fibrillation and in the past was felt to be a fall risk for Coumadin.  However, in March she was found to have probable multiple embolic events leading to her neurologic symptoms. Anticoagulation  with eliquis was recommended by neurology.  She had resolution of prior neurologic symptoms.  When I last saw her, I recommended she discontinue amlodipine and changed this to Cardizem 120 mg daily for improved rate control of her atrial flutter ablation.  Unfortunately, she has developed a GI bleed on Eliquis leading to its discontinuance.  In the past, she seemed to tolerate Plavix with low-dose baby aspirin.  I discussed with her risk-benefit ratios of aspirin alone, versus aspirin/Plavix, versus no active therapy.  With her recent GI bleed she is not a candidate for Coumadin or NOAC treatment.  In the past.  She had tolerated Plavix and I will attempt to reinstitute this therapy.  However, despite this, she did experience a small CVA earlier this year.  She will monitor any change in stool character or color or any issues with potential bleeding.  I will obtain a CBC in one week.  She will follow-up with Dr. Larose Kells.  I will see her in 3 months for follow-up evaluation.  Troy Sine, MD, [facility] 05/12/2014 12:49 PM

## 2014-05-13 ENCOUNTER — Other Ambulatory Visit: Payer: Self-pay | Admitting: Internal Medicine

## 2014-05-22 ENCOUNTER — Other Ambulatory Visit: Payer: Self-pay | Admitting: Physician Assistant

## 2014-05-23 NOTE — Telephone Encounter (Signed)
Rx request to pharmacy/SLS  

## 2014-05-30 LAB — CBC
HCT: 42.1 % (ref 36.0–46.0)
Hemoglobin: 13.8 g/dL (ref 12.0–15.0)
MCH: 31.6 pg (ref 26.0–34.0)
MCHC: 32.8 g/dL (ref 30.0–36.0)
MCV: 96.3 fL (ref 78.0–100.0)
MPV: 11.1 fL (ref 9.4–12.4)
PLATELETS: 199 10*3/uL (ref 150–400)
RBC: 4.37 MIL/uL (ref 3.87–5.11)
RDW: 15.4 % (ref 11.5–15.5)
WBC: 5.7 10*3/uL (ref 4.0–10.5)

## 2014-05-31 ENCOUNTER — Encounter: Payer: Self-pay | Admitting: *Deleted

## 2014-06-13 ENCOUNTER — Ambulatory Visit (INDEPENDENT_AMBULATORY_CARE_PROVIDER_SITE_OTHER): Payer: Medicare Other | Admitting: Internal Medicine

## 2014-06-13 ENCOUNTER — Encounter: Payer: Self-pay | Admitting: Internal Medicine

## 2014-06-13 VITALS — BP 128/72 | HR 63 | Temp 97.8°F | Ht 62.0 in | Wt 165.0 lb

## 2014-06-13 DIAGNOSIS — E1159 Type 2 diabetes mellitus with other circulatory complications: Secondary | ICD-10-CM | POA: Diagnosis not present

## 2014-06-13 DIAGNOSIS — I480 Paroxysmal atrial fibrillation: Secondary | ICD-10-CM

## 2014-06-13 DIAGNOSIS — K921 Melena: Secondary | ICD-10-CM | POA: Diagnosis not present

## 2014-06-13 NOTE — Progress Notes (Signed)
Subjective:    Patient ID: Rebecca Skinner, female    DOB: 11-Jul-1922, 81 y.o.   MRN: [id number]  DOS:  06/13/2014 Type of visit - description : rov, here w/ daughter Interval history: No major concerns  Med list reviewed, good compliance. Note from cardiology reviewed, she was felt to be stable, back on plavix. Labs reviewed, not due for any lab   ROS Appetite is relatively good. Denies shortness of breath, from time to time has chest pain, this is not a new issue, happens very seldom and is not getting more intense or frequent. No nausea or vomiting, no blood in the stools  Past Medical History  Diagnosis Date  . Asthma   . Diabetes mellitus   . Hypertension   . Hyperlipidemia   . CAD (coronary artery disease)     s/p CABG s/p AO valve replacement ([place] CV)  . Recurrent UTI   . Paroxysmal atrial fibrillation     cards d/c coumadin 12/2008 d/t persisten NSR and frequent falls- restarted coumadin july 2011, now on Eliquis  . Hyperthyroidism   . Dry skin   . Osteoarthritis   . Osteopenia     per dexa 12/09  . Keloid     @ chest  . Dizziness     Chronic, admiet 07-2010,saw neuro, thought to be a peripheral issue   . Anxiety 11/20/2011  . CVA (cerebral infarction) 08-2013    multiple, L, d/t Afib, started eloquis  . Memory loss     Past Surgical History  Procedure Laterality Date  . Abdominal hysterectomy    . Total knee arthroplasty  1999  . Oophorectomy    . Cagb  1998  . Cesarean section      x 2  . Hemorrhoid surgery    . Colonoscopy with propofol N/A 03/30/2014    Procedure: COLONOSCOPY WITH PROPOFOL;  Surgeon: Irene Shipper, MD;  Location: [facility];  Service: Endoscopy;  Laterality: N/A;    History   Social History  . Marital Status: Divorced    Spouse Name: N/A    Number of Children: 68  . Years of Education: N/A   Occupational History  . retired     Social History Main Topics  . Smoking status: Former Research scientist (life sciences)  . Smokeless tobacco: Never Used      Comment: quit 2004, smoked 1.5 ppd  . Alcohol Use: No  . Drug Use: No  . Sexual Activity: Not on file   Other Topics Concern  . Not on file   Social History Narrative   Back living at her house since the last visit, GGson lives w/ her (79 y/o)   Had 3 daughter- 2 son  (lost oldest daughter and son), 2 living daughters in [place]        Medication List       This list is accurate as of: 06/13/14 11:59 PM.  Always use your most recent med list.               acetaminophen 500 MG tablet  Commonly known as:  TYLENOL  Take 1,000 mg by mouth every 6 (six) hours as needed for moderate pain (pain).     albuterol 108 (90 BASE) MCG/ACT inhaler  Commonly known as:  PROVENTIL HFA;VENTOLIN HFA  Inhale 2 puffs into the lungs every 6 (six) hours as needed for wheezing or shortness of breath (wheezing).     ALPRAZolam 0.5 MG tablet  Commonly known as:  Duanne Moron  Take 0.5 mg by mouth at bedtime as needed for anxiety or sleep (insomnia).     aspirin 81 MG tablet  Take 81 mg by mouth daily.     atorvastatin 40 MG tablet  Commonly known as:  LIPITOR  TAKE 1 TABLET BY MOUTH AT BEDTIME     benazepril 20 MG tablet  Commonly known as:  LOTENSIN  Take 20 mg by mouth daily.     CALCIUM 500 PO  Take 500 mg by mouth at bedtime.     cholecalciferol 1000 UNITS tablet  Commonly known as:  VITAMIN D  Take 1,000 Units by mouth daily.     clopidogrel 75 MG tablet  Commonly known as:  PLAVIX  Take 1 tablet (75 mg total) by mouth daily.     diltiazem 120 MG 24 hr capsule  Commonly known as:  CARDIZEM CD  TAKE 1 CAPSULE (120 MG TOTAL) BY MOUTH DAILY.     docusate sodium 100 MG capsule  Commonly known as:  COLACE  Take 1 capsule (100 mg total) by mouth daily.     dorzolamide-timolol 22.3-6.8 MG/ML ophthalmic solution  Commonly known as:  COSOPT     esomeprazole 40 MG capsule  Commonly known as:  NEXIUM  Take 1 capsule (40 mg total) by mouth daily before breakfast.     furosemide  20 MG tablet  Commonly known as:  LASIX  TAKE 1 TABLET (20 MG TOTAL) BY MOUTH DAILY.     glucose blood test strip  1 each by Other route as needed for other (blood sugar test). Use as instructed     JANUVIA 50 MG tablet  Generic drug:  sitaGLIPtin  TAKE 1 TABLET BY MOUTH EVERY DAY     methimazole 5 MG tablet  Commonly known as:  TAPAZOLE  Take 5 mg by mouth daily. Takes Monday, Wednesday, and Friday only     metoprolol tartrate 25 MG tablet  Commonly known as:  LOPRESSOR  TAKE 1 TABLET BY MOUTH 3 TIMES A DAY     nitroGLYCERIN 0.4 MG SL tablet  Commonly known as:  NITROSTAT  Place 0.4 mg under the tongue every 5 (five) minutes as needed for chest pain (chest pain).     ONETOUCH DELICA LANCETS 66Q Misc     polyethylene glycol packet  Commonly known as:  MIRALAX / GLYCOLAX  Take 17 g by mouth daily. HOLD FOR DIARRHEA     QVAR 80 MCG/ACT inhaler  Generic drug:  beclomethasone  Inhale 2 puffs into the lungs 2 (two) times daily.     TRAVATAN Z 0.004 % Soln ophthalmic solution  Generic drug:  Travoprost (BAK Free)  Place 1 drop into both eyes at bedtime.           Objective:   Physical Exam BP 128/72 mmHg  Pulse 63  Temp(Src) 97.8 F (36.6 C) (Oral)  Ht 5\' 2"  (1.575 m)  Wt 165 lb (74.844 kg)  BMI 30.17 kg/m2  SpO2 96% General -- alert, well-developed, NAD.   Lungs -- normal respiratory effort, no intercostal retractions, no accessory muscle use, and normal breath sounds.  Heart--  .irreg   Extremities-- no pretibial edema bilaterally  Neurologic--  alert & oriented X3. Speech normal, gait appropriate for age, strength symmetric and appropriate for age.  Psych-- Cognition and judgment appear intact. Cooperative with normal attention span and concentration. No anxious or depressed appearing.        Assessment & Plan:

## 2014-06-13 NOTE — Progress Notes (Signed)
Pre visit review using our clinic review tool, if applicable. No additional management support is needed unless otherwise documented below in the visit note. 

## 2014-06-13 NOTE — Patient Instructions (Signed)
   Please come back to the office in 3 months for a routine check up

## 2014-06-14 NOTE — Assessment & Plan Note (Signed)
Well-controlled at the present time. ?

## 2014-06-14 NOTE — Assessment & Plan Note (Signed)
No further symptoms 

## 2014-06-14 NOTE — Assessment & Plan Note (Signed)
Currently on a low dose of Januvia, does not like to check CBGs, last A1c 6.1. Plan: Continue Januvia, recheck a A1c when she comes back, she is very low risk for hypoglycemia

## 2014-07-03 ENCOUNTER — Other Ambulatory Visit: Payer: Self-pay | Admitting: Endocrinology

## 2014-07-07 DIAGNOSIS — Z96652 Presence of left artificial knee joint: Secondary | ICD-10-CM | POA: Diagnosis not present

## 2014-07-07 DIAGNOSIS — M1711 Unilateral primary osteoarthritis, right knee: Secondary | ICD-10-CM | POA: Diagnosis not present

## 2014-07-12 ENCOUNTER — Other Ambulatory Visit: Payer: Self-pay | Admitting: Endocrinology

## 2014-07-13 ENCOUNTER — Other Ambulatory Visit: Payer: Self-pay | Admitting: Internal Medicine

## 2014-07-22 ENCOUNTER — Ambulatory Visit (INDEPENDENT_AMBULATORY_CARE_PROVIDER_SITE_OTHER): Payer: Self-pay | Admitting: Endocrinology

## 2014-07-22 ENCOUNTER — Encounter: Payer: Self-pay | Admitting: Endocrinology

## 2014-07-22 VITALS — BP 142/88 | HR 74 | Temp 97.9°F | Ht 62.0 in | Wt 163.0 lb

## 2014-07-22 DIAGNOSIS — E0520 Thyrotoxicosis with toxic multinodular goiter without thyrotoxic crisis or storm: Secondary | ICD-10-CM

## 2014-07-22 NOTE — Progress Notes (Signed)
Subjective:    Patient ID: Rebecca Skinner, female    DOB: [birth year], 81 y.o.   MRN: [id number]  HPI pt returns for f/u of hyperthyroidism (due to multinodular goiter; dx'ed 2008; tapazole was chosen as initial rx, due to AF; due to increasing TSH, this was reduced, then stopped altogether in 2011; also in 2011, US showed small multinodular goiter; in early 2012, hyperthyroidism recurred; the plan was for scanning, then i-131 rx, but a repeat TSH was normal. the TSH did not go low again until mid-2014; she chose to resume tapazole; in October of 2014, tapazole was reduced, due to normalization of her TFT). she takes tapazole as rx'ed. She denies tremor.  pt states she feels well in general.  Past Medical History  Diagnosis Date  . Asthma   . Diabetes mellitus   . Hypertension   . Hyperlipidemia   . CAD (coronary artery disease)     s/p CABG s/p AO valve replacement ([place] [facility])  . Recurrent UTI   . Paroxysmal atrial fibrillation     cards d/c coumadin 12/2008 d/t persisten NSR and frequent falls- restarted coumadin july 2011, now on Eliquis  . Hyperthyroidism   . Dry skin   . Osteoarthritis   . Osteopenia     per dexa 12/09  . Keloid     @ chest  . Dizziness     Chronic, admiet 07-2010,saw neuro, thought to be a peripheral issue   . Anxiety 11/20/2011  . CVA (cerebral infarction) 08-2013    multiple, L, d/t Afib, started eloquis  . Memory loss     Past Surgical History  Procedure Laterality Date  . Abdominal hysterectomy    . Total knee arthroplasty  1999  . Oophorectomy    . Cagb  1998  . Cesarean section      x 2  . Hemorrhoid surgery    . Colonoscopy with propofol N/A 03/30/2014    Procedure: COLONOSCOPY WITH PROPOFOL;  Surgeon: Irene Shipper, MD;  Location: [facility];  Service: Endoscopy;  Laterality: N/A;    History   Social History  . Marital Status: Divorced    Spouse Name: N/A  . Number of Children: 4  . Years of Education: N/A   Occupational History  .  retired     Social History Main Topics  . Smoking status: Former Research scientist (life sciences)  . Smokeless tobacco: Never Used     Comment: quit 2004, smoked 1.5 ppd  . Alcohol Use: No  . Drug Use: No  . Sexual Activity: Not on file   Other Topics Concern  . Not on file   Social History Narrative   Back living at her house since the last visit, GGson lives w/ her (79 y/o)   Had 3 daughter- 2 son  (lost oldest daughter and son), 2 living daughters in [place]    Current Outpatient Prescriptions on File Prior to Visit  Medication Sig Dispense Refill  . acetaminophen (TYLENOL) 500 MG tablet Take 1,000 mg by mouth every 6 (six) hours as needed for moderate pain (pain).    Marland Kitchen albuterol (PROVENTIL HFA;VENTOLIN HFA) 108 (90 BASE) MCG/ACT inhaler Inhale 2 puffs into the lungs every 6 (six) hours as needed for wheezing or shortness of breath (wheezing).    . ALPRAZolam (XANAX) 0.5 MG tablet Take 0.5 mg by mouth at bedtime as needed for anxiety or sleep (insomnia).     Marland Kitchen aspirin 81 MG tablet Take 81 mg by mouth daily.    Marland Kitchen  atorvastatin (LIPITOR) 40 MG tablet TAKE 1 TABLET BY MOUTH AT BEDTIME 30 tablet 3  . beclomethasone (QVAR) 80 MCG/ACT inhaler Inhale 2 puffs into the lungs 2 (two) times daily.    . benazepril (LOTENSIN) 20 MG tablet Take 20 mg by mouth daily.    . Calcium Carbonate (CALCIUM 500 PO) Take 500 mg by mouth at bedtime.     . cholecalciferol (VITAMIN D) 1000 UNITS tablet Take 1,000 Units by mouth daily.      . clopidogrel (PLAVIX) 75 MG tablet Take 1 tablet (75 mg total) by mouth daily. 90 tablet 3  . diltiazem (CARDIZEM CD) 120 MG 24 hr capsule TAKE 1 CAPSULE (120 MG TOTAL) BY MOUTH DAILY. 30 capsule 3  . docusate sodium (COLACE) 100 MG capsule Take 1 capsule (100 mg total) by mouth daily. 30 capsule 0  . dorzolamide-timolol (COSOPT) 22.3-6.8 MG/ML ophthalmic solution     . esomeprazole (NEXIUM) 40 MG capsule Take 1 capsule (40 mg total) by mouth daily before breakfast. 30 capsule 3  . furosemide  (LASIX) 20 MG tablet TAKE 1 TABLET (20 MG TOTAL) BY MOUTH DAILY. 30 tablet 5  . glucose blood test strip 1 each by Other route as needed for other (blood sugar test). Use as instructed    . JANUVIA 50 MG tablet TAKE 1 TABLET BY MOUTH EVERY DAY 30 tablet 4  . methimazole (TAPAZOLE) 5 MG tablet Take 5 mg by mouth daily. Takes Monday, Wednesday, and Friday only    . methimazole (TAPAZOLE) 5 MG tablet TAKE 1 TABLET (5 MG TOTAL) BY MOUTH EVERY MONDAY, WEDNESDAY, AND FRIDAY. 40 tablet 0  . methimazole (TAPAZOLE) 5 MG tablet Take 1 tablet by mouth every Monday, Wednesday and Friday. APPOINTMENT NEEDED FOR FURTHER REFILLS 40 tablet 0  . metoprolol tartrate (LOPRESSOR) 25 MG tablet TAKE 1 TABLET BY MOUTH 3 TIMES A DAY 90 tablet 2  . nitroGLYCERIN (NITROSTAT) 0.4 MG SL tablet Place 0.4 mg under the tongue every 5 (five) minutes as needed for chest pain (chest pain).     Glory Rosebush DELICA LANCETS 66Q MISC     . polyethylene glycol (MIRALAX / GLYCOLAX) packet Take 17 g by mouth daily. HOLD FOR DIARRHEA 14 each 0  . TRAVATAN Z 0.004 % SOLN ophthalmic solution Place 1 drop into both eyes at bedtime.     . [DISCONTINUED] potassium chloride (KLOR-CON 10) 10 MEQ CR tablet Take 1 tablet (10 mEq total) by mouth daily. 10 tablet 0   No current facility-administered medications on file prior to visit.    Allergies  Allergen Reactions  . Hydrocodone     REACTION: itching/nausea  . Tramadol Hcl     REACTION: itching , upset stomach (08-2009)    Family History  Problem Relation Age of Onset  . Colon cancer Neg Hx   . Breast cancer Neg Hx   . Thyroid disease Neg Hx   . Coronary artery disease Son 12    deceased  . Heart attack Father   . Hypertension Brother   . Stroke Brother     BP 142/88 mmHg  Pulse 74  Temp(Src) 97.9 F (36.6 C) (Oral)  Ht 5\' 2"  (1.575 m)  Wt 163 lb (73.936 kg)  BMI 29.81 kg/m2  SpO2 99%  Review of Systems Denies fever     Objective:   Physical Exam VITAL SIGNS:  See vs  page GENERAL: no distress NECK: small multinodular goiter.       Assessment & Plan:  Hyperthyroidism, on rx   Patient is advised the following: Patient Instructions  A thyroid blood test is requested for you today.  We'll contact you with results.   if ever you have fever while taking methimazole, stop it and call us, because of the risk of a rare side-effect.   Please come back for a follow-up appointment in 6 months.

## 2014-07-22 NOTE — Patient Instructions (Addendum)
A thyroid blood test is requested for you today.  We'll contact you with results.   if ever you have fever while taking methimazole, stop it and call us, because of the risk of a rare side-effect.   Please come back for a follow-up appointment in 6 months.

## 2014-08-02 ENCOUNTER — Telehealth: Payer: Self-pay | Admitting: Internal Medicine

## 2014-08-02 MED ORDER — ALBUTEROL SULFATE HFA 108 (90 BASE) MCG/ACT IN AERS: 2.0000 | INHALATION_SPRAY | Freq: Four times a day (QID) | RESPIRATORY_TRACT | Status: DC | PRN

## 2014-08-02 NOTE — Telephone Encounter (Signed)
Caller name: ruth Wunschel Relation to pt: daughter Call back number: [phone] Pharmacy: [facility] [address]  Reason for call:   Requesting albuterol refill for patient

## 2014-08-02 NOTE — Telephone Encounter (Signed)
Al[facility]erol refilled to [facility] [address].

## 2014-08-08 DIAGNOSIS — Z471 Aftercare following joint replacement surgery: Secondary | ICD-10-CM | POA: Diagnosis not present

## 2014-08-08 DIAGNOSIS — Z96652 Presence of left artificial knee joint: Secondary | ICD-10-CM | POA: Diagnosis not present

## 2014-08-09 ENCOUNTER — Encounter: Payer: Self-pay | Admitting: *Deleted

## 2014-08-09 ENCOUNTER — Other Ambulatory Visit: Payer: Self-pay

## 2014-08-09 ENCOUNTER — Other Ambulatory Visit (HOSPITAL_COMMUNITY): Payer: Self-pay | Admitting: Specialist

## 2014-08-09 ENCOUNTER — Other Ambulatory Visit: Payer: Self-pay | Admitting: Internal Medicine

## 2014-08-09 DIAGNOSIS — M25562 Pain in left knee: Secondary | ICD-10-CM

## 2014-08-11 ENCOUNTER — Ambulatory Visit (INDEPENDENT_AMBULATORY_CARE_PROVIDER_SITE_OTHER): Payer: Medicare Other | Admitting: Internal Medicine

## 2014-08-11 ENCOUNTER — Encounter: Payer: Self-pay | Admitting: Internal Medicine

## 2014-08-11 VITALS — BP 128/76 | HR 76 | Temp 97.6°F | Wt 165.0 lb

## 2014-08-11 DIAGNOSIS — R040 Epistaxis: Secondary | ICD-10-CM

## 2014-08-11 NOTE — Patient Instructions (Signed)
If you have a nosebleed, apply pressure to the nose for 5 for 10 minutes.  If the nosebleeds persist, use a OTC nasal spray call Afrin and proceeded to apply pressure to the nose for additional 5 for 10 minutes.  If you are not better : call the office, if the pain is severe go to the ER  Use Vaseline in the nose bilaterally at nighttime.

## 2014-08-11 NOTE — Progress Notes (Signed)
Subjective:    Patient ID: Rebecca Skinner, female    DOB: 1923-03-07, 81 y.o.   MRN: [id number]  DOS:  08/11/2014 Type of visit - description : acute Interval history: 3-4 days history of nosebleeds, worse on the right, she sees blood sometimes when she blows but occasionally spontaneously. Has a hard time describing the amount but said this just few drops.   Review of Systems Denies fever, chills, runny nose or postnasal dripping No sinus pain or congestion No blood in the urine or in the stools  Past Medical History  Diagnosis Date  . Diabetes mellitus   . Hypertension   . Hyperlipidemia   . CAD (coronary artery disease)     s/p CABG s/p AO valve replacement ([place] [facility])  . Recurrent UTI   . Paroxysmal atrial fibrillation     cards d/c coumadin 12/2008 d/t persisten NSR and frequent falls- restarted coumadin july 2011, now on Eliquis  . Hyperthyroidism   . Dry skin   . Osteoarthritis   . Osteopenia     per dexa 12/09  . Keloid     @ chest  . Dizziness     Chronic, admiet 07-2010,saw neuro, thought to be a peripheral issue   . Anxiety 11/20/2011  . CVA (cerebral infarction) 08-2013    multiple, L, d/t Afib, started eloquis  . Memory loss   . Asthma     [facility] DR Avabella Wailes    Past Surgical History  Procedure Laterality Date  . Abdominal hysterectomy    . Total knee arthroplasty  1999  . Oophorectomy    . Cagb  1998  . Cesarean section      x 2  . Hemorrhoid surgery    . Colonoscopy with propofol N/A 03/30/2014    Procedure: COLONOSCOPY WITH PROPOFOL;  Surgeon: Irene Shipper, MD;  Location: [facility];  Service: Endoscopy;  Laterality: N/A;  . Aortic valve replacement  1998  . Coronary artery bypass graft  1998    SVG TO RCA  . Transthoracic echocardiogram  09/11/11    LVEF >55%.STAGE 1 DIASTOLIC DYSFUNCTION,ELEVATED LV FILLING PRESSURE.BIOPROSTHETIC AORTIC VALVE-PEAK AND MEAN GRADIENTS OF 17 MMHG AND 8 MMHG.SIGMOID SEPTUM.MILD TO MOD MR.MILD TO MOD TR.RVSP  46 MMHG.  . Myocardial perfusion study  12/19/09    NORMAL PATTERN OF PERFUSION IN ALL REGIONS.EF 75%.  . Carotid doppler  07/13/12    BILATERAL BULB/PROXIMAL ICAS;MILD AMOUNT OF FIBROUS PLAQUE WITH NO DIAMETER REDUCTION.    History   Social History  . Marital Status: Divorced    Spouse Name: N/A  . Number of Children: 4  . Years of Education: N/A   Occupational History  . retired     Social History Main Topics  . Smoking status: Former Research scientist (life sciences)  . Smokeless tobacco: Never Used     Comment: quit 2004, smoked 1.5 ppd  . Alcohol Use: No  . Drug Use: No  . Sexual Activity: Not on file   Other Topics Concern  . Not on file   Social History Narrative   Back living at her house since the last visit, GGson lives w/ her (79 y/o)   Had 3 daughter- 2 son  (lost oldest daughter and son), 2 living daughters in [place]        Medication List       This list is accurate as of: 08/11/14 11:59 PM.  Always use your most recent med list.  acetaminophen 500 MG tablet  Commonly known as:  TYLENOL  Take 1,000 mg by mouth every 6 (six) hours as needed for moderate pain (pain).     albuterol 108 (90 BASE) MCG/ACT inhaler  Commonly known as:  PROVENTIL HFA;VENTOLIN HFA  Inhale 2 puffs into the lungs every 6 (six) hours as needed for wheezing or shortness of breath (wheezing).     ALPRAZolam 0.5 MG tablet  Commonly known as:  XANAX  Take 0.5 mg by mouth at bedtime as needed for anxiety or sleep (insomnia).     aspirin 81 MG tablet  Take 81 mg by mouth daily.     atorvastatin 40 MG tablet  Commonly known as:  LIPITOR  TAKE 1 TABLET BY MOUTH AT BEDTIME     benazepril 20 MG tablet  Commonly known as:  LOTENSIN  Take 20 mg by mouth daily.     CALCIUM 500 PO  Take 500 mg by mouth at bedtime.     cholecalciferol 1000 UNITS tablet  Commonly known as:  VITAMIN D  Take 1,000 Units by mouth daily.     clopidogrel 75 MG tablet  Commonly known as:  PLAVIX  Take 1  tablet (75 mg total) by mouth daily.     diltiazem 120 MG 24 hr capsule  Commonly known as:  CARDIZEM CD  TAKE 1 CAPSULE (120 MG TOTAL) BY MOUTH DAILY.     docusate sodium 100 MG capsule  Commonly known as:  COLACE  Take 1 capsule (100 mg total) by mouth daily.     dorzolamide-timolol 22.3-6.8 MG/ML ophthalmic solution  Commonly known as:  COSOPT     esomeprazole 40 MG capsule  Commonly known as:  NEXIUM  Take 1 capsule (40 mg total) by mouth daily before breakfast.     furosemide 20 MG tablet  Commonly known as:  LASIX  TAKE 1 TABLET (20 MG TOTAL) BY MOUTH DAILY.     glucose blood test strip  1 each by Other route as needed for other (blood sugar test). Use as instructed     JANUVIA 50 MG tablet  Generic drug:  sitaGLIPtin  TAKE 1 TABLET BY MOUTH EVERY DAY     methimazole 5 MG tablet  Commonly known as:  TAPAZOLE  Take 1 tablet by mouth every Monday, Wednesday and Friday. APPOINTMENT NEEDED FOR FURTHER REFILLS     metoprolol tartrate 25 MG tablet  Commonly known as:  LOPRESSOR  TAKE 1 TABLET BY MOUTH 3 TIMES A DAY     nitroGLYCERIN 0.4 MG SL tablet  Commonly known as:  NITROSTAT  Place 0.4 mg under the tongue every 5 (five) minutes as needed for chest pain (chest pain).     ONETOUCH DELICA LANCETS 16B Misc     polyethylene glycol packet  Commonly known as:  MIRALAX / GLYCOLAX  Take 17 g by mouth daily. HOLD FOR DIARRHEA     QVAR 80 MCG/ACT inhaler  Generic drug:  beclomethasone  Inhale 2 puffs into the lungs 2 (two) times daily.     TRAVATAN Z 0.004 % Soln ophthalmic solution  Generic drug:  Travoprost (BAK Free)  Place 1 drop into both eyes at bedtime.           Objective:   Physical Exam BP 128/76 mmHg  Pulse 76  Temp(Src) 97.6 F (36.4 C) (Oral)  Wt 165 lb (74.844 kg)  SpO2 98% General:   Well developed, well nourished . NAD.  HEENT:  Normocephalic . Face  symmetric, atraumatic; sinuses not tender to palpation Nostrils: Left normal, right side  with evidence of recent bleed from the medial wall, no active bleeding. Neurologic:  alert & oriented X3.  Speech normal, gait appropriate for age and  Assisted by a walker Psych--   behavior appropriate. No anxious or depressed appearing.       Assessment & Plan:   Nosebleeds, See  instructions, recommend sporadically use Afrin if needed. continue taking aspirin and Plavix, will  monitor the situation, no need to stop antiplatelets at this time

## 2014-08-11 NOTE — Progress Notes (Signed)
Pre visit review using our clinic review tool, if applicable. No additional management support is needed unless otherwise documented below in the visit note. 

## 2014-08-15 ENCOUNTER — Ambulatory Visit: Payer: Medicare Other | Admitting: Cardiovascular Disease

## 2014-08-16 ENCOUNTER — Encounter (HOSPITAL_COMMUNITY)
Admission: RE | Admit: 2014-08-16 | Discharge: 2014-08-16 | Disposition: A | Discharge status: Home / Self Care | Payer: Medicare Other | Source: Ambulatory Visit | Attending: Specialist | Admitting: Specialist

## 2014-08-16 ENCOUNTER — Ambulatory Visit (HOSPITAL_COMMUNITY)
Admission: RE | Admit: 2014-08-16 | Discharge: 2014-08-16 | Disposition: A | Discharge status: Home / Self Care | Payer: Medicare Other | Source: Ambulatory Visit | Attending: Specialist | Admitting: Specialist

## 2014-08-16 DIAGNOSIS — R296 Repeated falls: Secondary | ICD-10-CM | POA: Diagnosis not present

## 2014-08-16 DIAGNOSIS — Z96652 Presence of left artificial knee joint: Secondary | ICD-10-CM | POA: Diagnosis not present

## 2014-08-16 DIAGNOSIS — M25562 Pain in left knee: Secondary | ICD-10-CM | POA: Insufficient documentation

## 2014-08-16 MED ORDER — TECHNETIUM TC 99M MEDRONATE IV KIT
25.0000 | PACK | Freq: Once | INTRAVENOUS | Status: AC | PRN
  Administered 2014-08-16: 25 via INTRAVENOUS

## 2014-08-22 ENCOUNTER — Other Ambulatory Visit: Payer: Self-pay | Admitting: Cardiovascular Disease

## 2014-08-22 NOTE — Telephone Encounter (Signed)
Rx has been sent to the pharmacy electronically. ° °

## 2014-08-23 DIAGNOSIS — H4011X2 Primary open-angle glaucoma, moderate stage: Secondary | ICD-10-CM | POA: Diagnosis not present

## 2014-09-12 ENCOUNTER — Encounter: Payer: Self-pay | Admitting: Internal Medicine

## 2014-09-12 ENCOUNTER — Ambulatory Visit (INDEPENDENT_AMBULATORY_CARE_PROVIDER_SITE_OTHER): Payer: Medicare Other | Admitting: Internal Medicine

## 2014-09-12 VITALS — BP 124/78 | HR 81 | Temp 98.0°F | Ht 62.0 in | Wt 164.5 lb

## 2014-09-12 DIAGNOSIS — E785 Hyperlipidemia, unspecified: Secondary | ICD-10-CM | POA: Diagnosis not present

## 2014-09-12 DIAGNOSIS — I10 Essential (primary) hypertension: Secondary | ICD-10-CM | POA: Diagnosis not present

## 2014-09-12 DIAGNOSIS — J45909 Unspecified asthma, uncomplicated: Secondary | ICD-10-CM

## 2014-09-12 DIAGNOSIS — I480 Paroxysmal atrial fibrillation: Secondary | ICD-10-CM | POA: Diagnosis not present

## 2014-09-12 DIAGNOSIS — E1159 Type 2 diabetes mellitus with other circulatory complications: Secondary | ICD-10-CM

## 2014-09-12 DIAGNOSIS — E0520 Thyrotoxicosis with toxic multinodular goiter without thyrotoxic crisis or storm: Secondary | ICD-10-CM

## 2014-09-12 NOTE — Progress Notes (Signed)
Subjective:    Patient ID: Rebecca Skinner, female    DOB: [birth year], 81 y.o.   MRN: [id number]  DOS:  09/12/2014 Type of visit - description : rov Interval history:  Diabetes: Last A1c was 6.1%, no complaints of lower extremity numbness or tingling. Eye exam recently completed by ophthalmologist. Hypertension: BP well controlled on current medications. No chest pain, edema, or palpitations.  Patient mentioned occasionally being short of breath, patient is currently taking QVAR regularly.  Review of Systems  GENERAL: No fever, chills CV: (+) SOB, not far from baseline No  CP,  No palpitations, no lower extremity edema GI: Denies  nausea, vomiting diarrhea, blood in the stools NEURO: No headaches Occasionally, has blurred vision, symptoms are ill-defined but she denies denies diplopia, no amaurosis fugax Denies dizziness HEENT: Unspecified eye concern no more nosebleeds     Past Medical History  Diagnosis Date  . Diabetes mellitus   . Hypertension   . Hyperlipidemia   . CAD (coronary artery disease)     s/p CABG s/p AO valve replacement ([place] [facility])  . Recurrent UTI   . Paroxysmal atrial fibrillation     cards d/c coumadin 12/2008 d/t persisten NSR and frequent falls- restarted coumadin july 2011, now on Eliquis  . Hyperthyroidism   . Dry skin   . Osteoarthritis   . Osteopenia     per dexa 12/09  . Keloid     @ chest  . Dizziness     Chronic, admiet 07-2010,saw neuro, thought to be a peripheral issue   . Anxiety 11/20/2011  . CVA (cerebral infarction) 08-2013    multiple, L, d/t Afib, started eloquis  . Memory loss   . Asthma     [facility] DR PAZ    Past Surgical History  Procedure Laterality Date  . Abdominal hysterectomy    . Total knee arthroplasty  1999  . Oophorectomy    . Cagb  1998  . Cesarean section      x 2  . Hemorrhoid surgery    . Colonoscopy with propofol N/A 03/30/2014    Procedure: COLONOSCOPY WITH PROPOFOL;  Surgeon: Irene Shipper, MD;  Location: [facility];  Service: Endoscopy;  Laterality: N/A;  . Aortic valve replacement  1998  . Coronary artery bypass graft  1998    SVG TO RCA  . Transthoracic echocardiogram  09/11/11    LVEF >55%.STAGE 1 DIASTOLIC DYSFUNCTION,ELEVATED LV FILLING PRESSURE.BIOPROSTHETIC AORTIC VALVE-PEAK AND MEAN GRADIENTS OF 17 MMHG AND 8 MMHG.SIGMOID SEPTUM.MILD TO MOD MR.MILD TO MOD TR.RVSP 46 MMHG.  . Myocardial perfusion study  12/19/09    NORMAL PATTERN OF PERFUSION IN ALL REGIONS.EF 75%.  . Carotid doppler  07/13/12    BILATERAL BULB/PROXIMAL ICAS;MILD AMOUNT OF FIBROUS PLAQUE WITH NO DIAMETER REDUCTION.    History   Social History  . Marital Status: Divorced    Spouse Name: N/A  . Number of Children: 4  . Years of Education: N/A   Occupational History  . retired     Social History Main Topics  . Smoking status: Former Research scientist (life sciences)  . Smokeless tobacco: Never Used     Comment: quit 2004, smoked 1.5 ppd  . Alcohol Use: No  . Drug Use: No  . Sexual Activity: Not on file   Other Topics Concern  . Not on file   Social History Narrative   Back living at her house since the last visit, Rebecca Skinner lives w/ her (81 y/o)  Had 3 daughter- 2 son  (lost oldest daughter and son), 2 living daughters in [place]        Medication List       This list is accurate as of: 09/12/14 12:18 PM.  Always use your most recent med list.               acetaminophen 500 MG tablet  Commonly known as:  TYLENOL  Take 1,000 mg by mouth every 6 (six) hours as needed for moderate pain (pain).     albuterol 108 (90 BASE) MCG/ACT inhaler  Commonly known as:  PROVENTIL HFA;VENTOLIN HFA  Inhale 2 puffs into the lungs every 6 (six) hours as needed for wheezing or shortness of breath (wheezing).     ALPRAZolam 0.5 MG tablet  Commonly known as:  XANAX  Take 0.5 mg by mouth at bedtime as needed for anxiety or sleep (insomnia).     aspirin 81 MG tablet  Take 81 mg by mouth daily.     atorvastatin 40  MG tablet  Commonly known as:  LIPITOR  TAKE 1 TABLET BY MOUTH AT BEDTIME     benazepril 20 MG tablet  Commonly known as:  LOTENSIN  Take 20 mg by mouth daily.     CALCIUM 500 PO  Take 500 mg by mouth at bedtime.     cholecalciferol 1000 UNITS tablet  Commonly known as:  VITAMIN D  Take 1,000 Units by mouth daily.     clopidogrel 75 MG tablet  Commonly known as:  PLAVIX  Take 1 tablet (75 mg total) by mouth daily.     diltiazem 120 MG 24 hr capsule  Commonly known as:  CARDIZEM CD  TAKE 1 CAPSULE (120 MG TOTAL) BY MOUTH DAILY.     docusate sodium 100 MG capsule  Commonly known as:  COLACE  Take 1 capsule (100 mg total) by mouth daily.     dorzolamide-timolol 22.3-6.8 MG/ML ophthalmic solution  Commonly known as:  COSOPT     esomeprazole 40 MG capsule  Commonly known as:  NEXIUM  Take 1 capsule (40 mg total) by mouth daily before breakfast.     furosemide 20 MG tablet  Commonly known as:  LASIX  TAKE 1 TABLET (20 MG TOTAL) BY MOUTH DAILY.     glucose blood test strip  1 each by Other route as needed for other (blood sugar test). Use as instructed     JANUVIA 50 MG tablet  Generic drug:  sitaGLIPtin  TAKE 1 TABLET BY MOUTH EVERY DAY     methimazole 5 MG tablet  Commonly known as:  TAPAZOLE  Take 1 tablet by mouth every Monday, Wednesday and Friday. APPOINTMENT NEEDED FOR FURTHER REFILLS     metoprolol tartrate 25 MG tablet  Commonly known as:  LOPRESSOR  TAKE 1 TABLET BY MOUTH 3 TIMES A DAY     nitroGLYCERIN 0.4 MG SL tablet  Commonly known as:  NITROSTAT  Place 0.4 mg under the tongue every 5 (five) minutes as needed for chest pain (chest pain).     ONETOUCH DELICA LANCETS 53G Misc     polyethylene glycol packet  Commonly known as:  MIRALAX / GLYCOLAX  Take 17 g by mouth daily. HOLD FOR DIARRHEA     QVAR 80 MCG/ACT inhaler  Generic drug:  beclomethasone  Inhale 2 puffs into the lungs 2 (two) times daily.     TRAVATAN Z 0.004 % Soln ophthalmic solution   Generic drug:  Travoprost (  BAK Free)  Place 1 drop into both eyes at bedtime.           Objective:   Physical Exam BP 124/78 mmHg  Pulse 81  Temp(Src) 98 F (36.7 C) (Oral)  Ht 5\' 2"  (1.575 m)  Wt 164 lb 8 oz (74.617 kg)  BMI 30.08 kg/m2  SpO2 95%  General:   Well developed, well nourished . NAD.  HEENT:  Normocephalic . Face symmetric, atraumatic Lungs:  CTA B Normal respiratory effort, no intercostal retractions, no accessory muscle use. Heart: irreg, syst murmur II/IV.  Muscle skeletal: no pretibial edema bilaterally  Skin: Not pale. Not jaundice Neurologic:  alert & oriented X3.  Speech normal, gait appropriate for age and unassisted Psych--  Cognition and judgment appear intact.  Cooperative with normal attention span and concentration.  Behavior appropriate. No anxious or depressed appearing.     Assessment & Plan:    Visual disturbance, ill-defined, no diplopia or amaurosis fugax, recommend to see ophthalmology   This note was written by medical student Aletta Edouard as a scribe for Dr. Kathlene November.

## 2014-09-12 NOTE — Assessment & Plan Note (Addendum)
Well controlled on current medications. Patient recently completed eye exam. Plan: Continue current regimen, check A1c in 3 months (declined today).

## 2014-09-12 NOTE — Patient Instructions (Signed)
Come back to the office in 3 months  for a physical exam  Please schedule an appointment at the front desk    Come back fasting

## 2014-09-12 NOTE — Progress Notes (Deleted)
Subjective:    Patient ID: Rebecca Skinner, female    DOB: 09-23-1922, 81 y.o.   MRN: [id number]  DOS:  09/12/2014 Type of visit - description :  Interval history:    Review of Systems   Past Medical History  Diagnosis Date  . Diabetes mellitus   . Hypertension   . Hyperlipidemia   . CAD (coronary artery disease)     s/p CABG s/p AO valve replacement ([place] [facility])  . Recurrent UTI   . Paroxysmal atrial fibrillation     cards d/c coumadin 12/2008 d/t persisten NSR and frequent falls- restarted coumadin july 2011, now on Eliquis  . Hyperthyroidism   . Dry skin   . Osteoarthritis   . Osteopenia     per dexa 12/09  . Keloid     @ chest  . Dizziness     Chronic, admiet 07-2010,saw neuro, thought to be a peripheral issue   . Anxiety 11/20/2011  . CVA (cerebral infarction) 08-2013    multiple, L, d/t Afib, started eloquis  . Memory loss   . Asthma     UNDER THE [facility]ARE OPF DR PAZ    Past Surgical History  Procedure Laterality Date  . Abdominal hysterectomy    . Total knee arthroplasty  1999  . Oophorectomy    . Cagb  1998  . Cesarean section      x 2  . Hemorrhoid surgery    . Colonoscopy with propofol N/A 03/30/2014    Procedure: COLONOSCOPY WITH PROPOFOL;  Surgeon: Irene Shipper, MD;  Location: [facility];  Service: Endoscopy;  Laterality: N/A;  . Aortic valve replacement  1998  . Coronary artery bypass graft  1998    SVG TO RCA  . Transthoracic echocardiogram  09/11/11    LVEF >55%.STAGE 1 DIASTOLIC DYSFUNCTION,ELEVATED LV FILLING PRESSURE.BIOPROSTHETIC AORTIC VALVE-PEAK AND MEAN GRADIENTS OF 17 MMHG AND 8 MMHG.SIGMOID SEPTUM.MILD TO MOD MR.MILD TO MOD TR.RVSP 46 MMHG.  . Myocardial perfusion study  12/19/09    NORMAL PATTERN OF PERFUSION IN ALL REGIONS.EF 75%.  . Carotid doppler  07/13/12    BILATERAL BULB/PROXIMAL ICAS;MILD AMOUNT OF FIBROUS PLAQUE WITH NO DIAMETER REDUCTION.    History   Social History  . Marital Status: Divorced    Spouse Name: N/A  . Number of  Children: 4  . Years of Education: N/A   Occupational History  . retired     Social History Main Topics  . Smoking status: Former Research scientist (life sciences)  . Smokeless tobacco: Never Used     Comment: quit 2004, smoked 1.5 ppd  . Alcohol Use: No  . Drug Use: No  . Sexual Activity: Not on file   Other Topics Concern  . Not on file   Social History Narrative   Back living at her house since the last visit, GGson lives w/ her (79 y/o)   Had 3 daughter- 2 son  (lost oldest daughter and son), 2 living daughters in [place]        Medication List       This list is accurate as of: 09/12/14 12:19 PM.  Always use your most recent med list.               acetaminophen 500 MG tablet  Commonly known as:  TYLENOL  Take 1,000 mg by mouth every 6 (six) hours as needed for moderate pain (pain).     albuterol 108 (90 BASE) MCG/ACT inhaler  Commonly known as:  PROVENTIL HFA;VENTOLIN  HFA  Inhale 2 puffs into the lungs every 6 (six) hours as needed for wheezing or shortness of breath (wheezing).     ALPRAZolam 0.5 MG tablet  Commonly known as:  XANAX  Take 0.5 mg by mouth at bedtime as needed for anxiety or sleep (insomnia).     aspirin 81 MG tablet  Take 81 mg by mouth daily.     atorvastatin 40 MG tablet  Commonly known as:  LIPITOR  TAKE 1 TABLET BY MOUTH AT BEDTIME     benazepril 20 MG tablet  Commonly known as:  LOTENSIN  Take 20 mg by mouth daily.     CALCIUM 500 PO  Take 500 mg by mouth at bedtime.     cholecalciferol 1000 UNITS tablet  Commonly known as:  VITAMIN D  Take 1,000 Units by mouth daily.     clopidogrel 75 MG tablet  Commonly known as:  PLAVIX  Take 1 tablet (75 mg total) by mouth daily.     diltiazem 120 MG 24 hr capsule  Commonly known as:  CARDIZEM CD  TAKE 1 CAPSULE (120 MG TOTAL) BY MOUTH DAILY.     docusate sodium 100 MG capsule  Commonly known as:  COLACE  Take 1 capsule (100 mg total) by mouth daily.     dorzolamide-timolol 22.3-6.8 MG/ML ophthalmic  solution  Commonly known as:  COSOPT     esomeprazole 40 MG capsule  Commonly known as:  NEXIUM  Take 1 capsule (40 mg total) by mouth daily before breakfast.     furosemide 20 MG tablet  Commonly known as:  LASIX  TAKE 1 TABLET (20 MG TOTAL) BY MOUTH DAILY.     glucose blood test strip  1 each by Other route as needed for other (blood sugar test). Use as instructed     JANUVIA 50 MG tablet  Generic drug:  sitaGLIPtin  TAKE 1 TABLET BY MOUTH EVERY DAY     methimazole 5 MG tablet  Commonly known as:  TAPAZOLE  Take 1 tablet by mouth every Monday, Wednesday and Friday. APPOINTMENT NEEDED FOR FURTHER REFILLS     metoprolol tartrate 25 MG tablet  Commonly known as:  LOPRESSOR  TAKE 1 TABLET BY MOUTH 3 TIMES A DAY     nitroGLYCERIN 0.4 MG SL tablet  Commonly known as:  NITROSTAT  Place 0.4 mg under the tongue every 5 (five) minutes as needed for chest pain (chest pain).     ONETOUCH DELICA LANCETS 80H Misc     polyethylene glycol packet  Commonly known as:  MIRALAX / GLYCOLAX  Take 17 g by mouth daily. HOLD FOR DIARRHEA     QVAR 80 MCG/ACT inhaler  Generic drug:  beclomethasone  Inhale 2 puffs into the lungs 2 (two) times daily.     TRAVATAN Z 0.004 % Soln ophthalmic solution  Generic drug:  Travoprost (BAK Free)  Place 1 drop into both eyes at bedtime.           Objective:   Physical Exam BP 124/78 mmHg  Pulse 81  Temp(Src) 98 F (36.7 C) (Oral)  Ht 5\' 2"  (1.575 m)  Wt 164 lb 8 oz (74.617 kg)  BMI 30.08 kg/m2  SpO2 95%       Assessment & Plan:

## 2014-09-12 NOTE — Assessment & Plan Note (Signed)
BP well controlled on current medications. Plan: Continue current regimen.

## 2014-09-12 NOTE — Progress Notes (Signed)
Pre visit review using our clinic review tool, if applicable. No additional management support is needed unless otherwise documented below in the visit note. 

## 2014-09-12 NOTE — Assessment & Plan Note (Signed)
Patient has no complaints from methimazole. Care provided by Dr. Loanne Drilling.

## 2014-09-12 NOTE — Assessment & Plan Note (Signed)
Patient mentioned occasional shortness of breath, likely related to asthma. Plan: Continue QVAR as prescribed.

## 2014-09-12 NOTE — Assessment & Plan Note (Signed)
Due to GI bleed she is not taking Eliquis, rate is controlled.

## 2014-09-12 NOTE — Assessment & Plan Note (Signed)
Recheck fasting lipids in three months.

## 2014-09-28 DIAGNOSIS — Z471 Aftercare following joint replacement surgery: Secondary | ICD-10-CM | POA: Diagnosis not present

## 2014-09-28 DIAGNOSIS — M1711 Unilateral primary osteoarthritis, right knee: Secondary | ICD-10-CM | POA: Diagnosis not present

## 2014-09-28 DIAGNOSIS — Z96652 Presence of left artificial knee joint: Secondary | ICD-10-CM | POA: Diagnosis not present

## 2014-09-28 DIAGNOSIS — M25562 Pain in left knee: Secondary | ICD-10-CM | POA: Diagnosis not present

## 2014-09-28 DIAGNOSIS — M25561 Pain in right knee: Secondary | ICD-10-CM | POA: Diagnosis not present

## 2014-10-17 ENCOUNTER — Other Ambulatory Visit: Payer: Self-pay | Admitting: Internal Medicine

## 2014-10-19 ENCOUNTER — Other Ambulatory Visit: Payer: Self-pay | Admitting: Endocrinology

## 2014-11-03 ENCOUNTER — Other Ambulatory Visit: Payer: Self-pay | Admitting: Internal Medicine

## 2014-11-04 ENCOUNTER — Other Ambulatory Visit: Payer: Self-pay | Admitting: Internal Medicine

## 2014-11-10 ENCOUNTER — Other Ambulatory Visit: Payer: Self-pay | Admitting: Cardiovascular Disease

## 2014-11-10 NOTE — Telephone Encounter (Signed)
Rx(s) sent to pharmacy electronically.  

## 2014-11-16 ENCOUNTER — Other Ambulatory Visit: Payer: Self-pay | Admitting: Cardiovascular Disease

## 2014-11-16 NOTE — Telephone Encounter (Signed)
Rx(s) sent to pharmacy electronically.  

## 2014-11-29 DIAGNOSIS — E11329 Type 2 diabetes mellitus with mild nonproliferative diabetic retinopathy without macular edema: Secondary | ICD-10-CM | POA: Diagnosis not present

## 2014-11-29 LAB — HM DIABETES EYE EXAM

## 2014-12-11 ENCOUNTER — Other Ambulatory Visit: Payer: Self-pay | Admitting: Endocrinology

## 2014-12-19 ENCOUNTER — Other Ambulatory Visit: Payer: Self-pay

## 2014-12-20 ENCOUNTER — Inpatient Hospital Stay (HOSPITAL_COMMUNITY): Payer: Medicare Other

## 2014-12-20 ENCOUNTER — Encounter: Payer: Self-pay | Admitting: Internal Medicine

## 2014-12-20 ENCOUNTER — Encounter (HOSPITAL_COMMUNITY): Payer: Self-pay | Admitting: General Practice

## 2014-12-20 ENCOUNTER — Ambulatory Visit (INDEPENDENT_AMBULATORY_CARE_PROVIDER_SITE_OTHER): Payer: Medicare Other | Admitting: Internal Medicine

## 2014-12-20 ENCOUNTER — Inpatient Hospital Stay (HOSPITAL_COMMUNITY)
Admission: AD | Admit: 2014-12-20 | Discharge: 2014-12-22 | DRG: 292 | Disposition: A | Discharge status: Home / Self Care | Payer: Medicare Other | Source: Ambulatory Visit | Attending: Internal Medicine | Admitting: Internal Medicine

## 2014-12-20 VITALS — BP 128/78 | HR 70 | Temp 97.6°F | Ht 62.0 in | Wt 173.0 lb

## 2014-12-20 DIAGNOSIS — E119 Type 2 diabetes mellitus without complications: Secondary | ICD-10-CM | POA: Diagnosis not present

## 2014-12-20 DIAGNOSIS — K922 Gastrointestinal hemorrhage, unspecified: Secondary | ICD-10-CM | POA: Diagnosis not present

## 2014-12-20 DIAGNOSIS — Z87440 Personal history of urinary (tract) infections: Secondary | ICD-10-CM

## 2014-12-20 DIAGNOSIS — I272 Other secondary pulmonary hypertension: Secondary | ICD-10-CM | POA: Diagnosis present

## 2014-12-20 DIAGNOSIS — M8580 Other specified disorders of bone density and structure, unspecified site: Secondary | ICD-10-CM | POA: Diagnosis present

## 2014-12-20 DIAGNOSIS — I25810 Atherosclerosis of coronary artery bypass graft(s) without angina pectoris: Secondary | ICD-10-CM | POA: Diagnosis not present

## 2014-12-20 DIAGNOSIS — Z7902 Long term (current) use of antithrombotics/antiplatelets: Secondary | ICD-10-CM | POA: Diagnosis not present

## 2014-12-20 DIAGNOSIS — F419 Anxiety disorder, unspecified: Secondary | ICD-10-CM | POA: Diagnosis present

## 2014-12-20 DIAGNOSIS — I5033 Acute on chronic diastolic (congestive) heart failure: Principal | ICD-10-CM | POA: Diagnosis present

## 2014-12-20 DIAGNOSIS — J45909 Unspecified asthma, uncomplicated: Secondary | ICD-10-CM | POA: Diagnosis present

## 2014-12-20 DIAGNOSIS — R609 Edema, unspecified: Secondary | ICD-10-CM | POA: Diagnosis present

## 2014-12-20 DIAGNOSIS — I509 Heart failure, unspecified: Secondary | ICD-10-CM | POA: Diagnosis not present

## 2014-12-20 DIAGNOSIS — Z8673 Personal history of transient ischemic attack (TIA), and cerebral infarction without residual deficits: Secondary | ICD-10-CM

## 2014-12-20 DIAGNOSIS — R062 Wheezing: Secondary | ICD-10-CM | POA: Diagnosis not present

## 2014-12-20 DIAGNOSIS — Z888 Allergy status to other drugs, medicaments and biological substances status: Secondary | ICD-10-CM | POA: Diagnosis not present

## 2014-12-20 DIAGNOSIS — R0600 Dyspnea, unspecified: Secondary | ICD-10-CM | POA: Diagnosis present

## 2014-12-20 DIAGNOSIS — R0602 Shortness of breath: Secondary | ICD-10-CM | POA: Diagnosis not present

## 2014-12-20 DIAGNOSIS — I480 Paroxysmal atrial fibrillation: Secondary | ICD-10-CM | POA: Diagnosis not present

## 2014-12-20 DIAGNOSIS — M1990 Unspecified osteoarthritis, unspecified site: Secondary | ICD-10-CM | POA: Diagnosis present

## 2014-12-20 DIAGNOSIS — E785 Hyperlipidemia, unspecified: Secondary | ICD-10-CM | POA: Diagnosis not present

## 2014-12-20 DIAGNOSIS — Z7901 Long term (current) use of anticoagulants: Secondary | ICD-10-CM

## 2014-12-20 DIAGNOSIS — Z952 Presence of prosthetic heart valve: Secondary | ICD-10-CM

## 2014-12-20 DIAGNOSIS — I4891 Unspecified atrial fibrillation: Secondary | ICD-10-CM | POA: Diagnosis not present

## 2014-12-20 DIAGNOSIS — Z7982 Long term (current) use of aspirin: Secondary | ICD-10-CM | POA: Diagnosis not present

## 2014-12-20 DIAGNOSIS — Z96659 Presence of unspecified artificial knee joint: Secondary | ICD-10-CM | POA: Diagnosis present

## 2014-12-20 DIAGNOSIS — Z87891 Personal history of nicotine dependence: Secondary | ICD-10-CM | POA: Diagnosis not present

## 2014-12-20 DIAGNOSIS — E782 Mixed hyperlipidemia: Secondary | ICD-10-CM | POA: Diagnosis present

## 2014-12-20 DIAGNOSIS — E0590 Thyrotoxicosis, unspecified without thyrotoxic crisis or storm: Secondary | ICD-10-CM | POA: Diagnosis not present

## 2014-12-20 DIAGNOSIS — I10 Essential (primary) hypertension: Secondary | ICD-10-CM | POA: Diagnosis present

## 2014-12-20 DIAGNOSIS — E118 Type 2 diabetes mellitus with unspecified complications: Secondary | ICD-10-CM | POA: Diagnosis not present

## 2014-12-20 DIAGNOSIS — I517 Cardiomegaly: Secondary | ICD-10-CM | POA: Diagnosis not present

## 2014-12-20 HISTORY — DX: Gastro-esophageal reflux disease without esophagitis: K21.9

## 2014-12-20 HISTORY — DX: Reserved for inherently not codable concepts without codable children: IMO0001

## 2014-12-20 HISTORY — DX: Transient cerebral ischemic attack, unspecified: G45.9

## 2014-12-20 LAB — COMPREHENSIVE METABOLIC PANEL
ALT: 13 U/L — ABNORMAL LOW (ref 14–54)
AST: 20 U/L (ref 15–41)
Albumin: 3.6 g/dL (ref 3.5–5.0)
Alkaline Phosphatase: 95 U/L (ref 38–126)
Anion gap: 10 (ref 5–15)
BUN: 13 mg/dL (ref 6–20)
CALCIUM: 9 mg/dL (ref 8.9–10.3)
CHLORIDE: 101 mmol/L (ref 101–111)
CO2: 28 mmol/L (ref 22–32)
Creatinine, Ser: 1.02 mg/dL — ABNORMAL HIGH (ref 0.44–1.00)
GFR calc Af Amer: 55 mL/min — ABNORMAL LOW (ref >60)
GFR calc non Af Amer: 47 mL/min — ABNORMAL LOW (ref >60)
Glucose, Bld: 89 mg/dL (ref 65–99)
POTASSIUM: 3.9 mmol/L (ref 3.5–5.1)
Sodium: 139 mmol/L (ref 135–145)
Total Bilirubin: 0.5 mg/dL (ref 0.3–1.2)
Total Protein: 7.2 g/dL (ref 6.5–8.1)

## 2014-12-20 LAB — CBC
HCT: 46.2 % — ABNORMAL HIGH (ref 36.0–46.0)
Hemoglobin: 15.3 g/dL — ABNORMAL HIGH (ref 12.0–15.0)
MCH: 32.8 pg (ref 26.0–34.0)
MCHC: 33.1 g/dL (ref 30.0–36.0)
MCV: 98.9 fL (ref 78.0–100.0)
Platelets: 210 10*3/uL (ref 150–400)
RBC: 4.67 MIL/uL (ref 3.87–5.11)
RDW: 13.4 % (ref 11.5–15.5)
WBC: 6.4 10*3/uL (ref 4.0–10.5)

## 2014-12-20 LAB — URINALYSIS, ROUTINE W REFLEX MICROSCOPIC
BILIRUBIN URINE: NEGATIVE
Glucose, UA: NEGATIVE mg/dL
Hgb urine dipstick: NEGATIVE
Ketones, ur: NEGATIVE mg/dL
LEUKOCYTES UA: NEGATIVE
NITRITE: NEGATIVE
PROTEIN: NEGATIVE mg/dL
Specific Gravity, Urine: 1.009 (ref 1.005–1.030)
Urobilinogen, UA: 0.2 mg/dL (ref 0.0–1.0)
pH: 7 (ref 5.0–8.0)

## 2014-12-20 LAB — GLUCOSE, CAPILLARY
GLUCOSE-CAPILLARY: 116 mg/dL — AB (ref 65–99)
Glucose-Capillary: 117 mg/dL — ABNORMAL HIGH (ref 65–99)

## 2014-12-20 LAB — BRAIN NATRIURETIC PEPTIDE: B Natriuretic Peptide: 710.3 pg/mL — ABNORMAL HIGH (ref 0.0–100.0)

## 2014-12-20 LAB — TSH: TSH: 1.445 u[IU]/mL (ref 0.350–4.500)

## 2014-12-20 MED ORDER — NITROGLYCERIN 0.4 MG SL SUBL: 0.4000 mg | SUBLINGUAL_TABLET | SUBLINGUAL | Status: DC | PRN

## 2014-12-20 MED ORDER — ALBUTEROL SULFATE HFA 108 (90 BASE) MCG/ACT IN AERS: 2.0000 | INHALATION_SPRAY | Freq: Four times a day (QID) | RESPIRATORY_TRACT | Status: DC | PRN

## 2014-12-20 MED ORDER — PANTOPRAZOLE SODIUM 40 MG PO TBEC
40.0000 mg | DELAYED_RELEASE_TABLET | Freq: Every day | ORAL | Status: DC
  Administered 2014-12-21 – 2014-12-22 (×2): 40 mg via ORAL
  Filled 2014-12-20 (×2): qty 1

## 2014-12-20 MED ORDER — ALBUTEROL SULFATE (2.5 MG/3ML) 0.083% IN NEBU: 2.5000 mg | INHALATION_SOLUTION | Freq: Four times a day (QID) | RESPIRATORY_TRACT | Status: DC | PRN

## 2014-12-20 MED ORDER — ONDANSETRON HCL 4 MG PO TABS: 4.0000 mg | ORAL_TABLET | Freq: Four times a day (QID) | ORAL | Status: DC | PRN

## 2014-12-20 MED ORDER — BENAZEPRIL HCL 20 MG PO TABS
20.0000 mg | ORAL_TABLET | Freq: Every day | ORAL | Status: DC
  Administered 2014-12-21 – 2014-12-22 (×2): 20 mg via ORAL
  Filled 2014-12-20 (×2): qty 1

## 2014-12-20 MED ORDER — ATORVASTATIN CALCIUM 40 MG PO TABS
40.0000 mg | ORAL_TABLET | Freq: Every day | ORAL | Status: DC
  Administered 2014-12-20 – 2014-12-21 (×2): 40 mg via ORAL
  Filled 2014-12-20 (×2): qty 1

## 2014-12-20 MED ORDER — SODIUM CHLORIDE 0.9 % IJ SOLN
3.0000 mL | Freq: Two times a day (BID) | INTRAMUSCULAR | Status: DC
  Administered 2014-12-20 – 2014-12-21 (×3): 3 mL via INTRAVENOUS

## 2014-12-20 MED ORDER — DORZOLAMIDE HCL-TIMOLOL MAL 2-0.5 % OP SOLN
1.0000 [drp] | Freq: Two times a day (BID) | OPHTHALMIC | Status: DC
  Administered 2014-12-20 – 2014-12-21 (×3): 1 [drp] via OPHTHALMIC
  Filled 2014-12-20: qty 10

## 2014-12-20 MED ORDER — FUROSEMIDE 10 MG/ML IJ SOLN
40.0000 mg | Freq: Two times a day (BID) | INTRAMUSCULAR | Status: DC
  Administered 2014-12-20 – 2014-12-21 (×3): 40 mg via INTRAVENOUS
  Filled 2014-12-20 (×3): qty 4

## 2014-12-20 MED ORDER — ALUM & MAG HYDROXIDE-SIMETH 200-200-20 MG/5ML PO SUSP: 30.0000 mL | Freq: Four times a day (QID) | ORAL | Status: DC | PRN

## 2014-12-20 MED ORDER — ASPIRIN 81 MG PO CHEW
81.0000 mg | CHEWABLE_TABLET | Freq: Every day | ORAL | Status: DC
  Administered 2014-12-21 – 2014-12-22 (×2): 81 mg via ORAL
  Filled 2014-12-20 (×2): qty 1

## 2014-12-20 MED ORDER — ACETAMINOPHEN 325 MG PO TABS: 650.0000 mg | ORAL_TABLET | Freq: Four times a day (QID) | ORAL | Status: DC | PRN

## 2014-12-20 MED ORDER — METOPROLOL TARTRATE 25 MG PO TABS
25.0000 mg | ORAL_TABLET | Freq: Three times a day (TID) | ORAL | Status: DC
  Administered 2014-12-20 – 2014-12-22 (×6): 25 mg via ORAL
  Filled 2014-12-20 (×6): qty 1

## 2014-12-20 MED ORDER — DILTIAZEM HCL ER COATED BEADS 120 MG PO CP24
120.0000 mg | ORAL_CAPSULE | Freq: Every day | ORAL | Status: DC
  Administered 2014-12-21 – 2014-12-22 (×2): 120 mg via ORAL
  Filled 2014-12-20 (×4): qty 1

## 2014-12-20 MED ORDER — ALPRAZOLAM 0.5 MG PO TABS: 0.5000 mg | ORAL_TABLET | Freq: Every evening | ORAL | Status: DC | PRN

## 2014-12-20 MED ORDER — SODIUM CHLORIDE 0.9 % IV SOLN: 250.0000 mL | INTRAVENOUS | Status: DC | PRN

## 2014-12-20 MED ORDER — CLOPIDOGREL BISULFATE 75 MG PO TABS
75.0000 mg | ORAL_TABLET | Freq: Every day | ORAL | Status: DC
Start: 2014-12-21 — End: 2014-12-22
  Administered 2014-12-21 – 2014-12-22 (×2): 75 mg via ORAL
  Filled 2014-12-20 (×2): qty 1

## 2014-12-20 MED ORDER — LATANOPROST 0.005 % OP SOLN
1.0000 [drp] | Freq: Every day | OPHTHALMIC | Status: DC
  Administered 2014-12-20 – 2014-12-21 (×2): 1 [drp] via OPHTHALMIC
  Filled 2014-12-20: qty 2.5

## 2014-12-20 MED ORDER — SODIUM CHLORIDE 0.9 % IJ SOLN
3.0000 mL | Freq: Two times a day (BID) | INTRAMUSCULAR | Status: DC
  Administered 2014-12-20 – 2014-12-21 (×2): 3 mL via INTRAVENOUS

## 2014-12-20 MED ORDER — BUDESONIDE 0.25 MG/2ML IN SUSP
0.2500 mg | Freq: Two times a day (BID) | RESPIRATORY_TRACT | Status: DC
  Administered 2014-12-21 – 2014-12-22 (×3): 0.25 mg via RESPIRATORY_TRACT
  Filled 2014-12-20 (×3): qty 2

## 2014-12-20 MED ORDER — ACETAMINOPHEN 650 MG RE SUPP: 650.0000 mg | Freq: Four times a day (QID) | RECTAL | Status: DC | PRN

## 2014-12-20 MED ORDER — ASPIRIN 81 MG PO TABS: 81.0000 mg | ORAL_TABLET | Freq: Every day | ORAL | Status: DC

## 2014-12-20 MED ORDER — LINAGLIPTIN 5 MG PO TABS
5.0000 mg | ORAL_TABLET | Freq: Every day | ORAL | Status: DC
  Administered 2014-12-21 – 2014-12-22 (×2): 5 mg via ORAL
  Filled 2014-12-20 (×2): qty 1

## 2014-12-20 MED ORDER — BISACODYL 10 MG RE SUPP: 10.0000 mg | Freq: Every day | RECTAL | Status: DC | PRN

## 2014-12-20 MED ORDER — POLYETHYLENE GLYCOL 3350 17 G PO PACK
17.0000 g | PACK | Freq: Every day | ORAL | Status: DC
  Administered 2014-12-22: 17 g via ORAL
  Filled 2014-12-20 (×3): qty 1

## 2014-12-20 MED ORDER — ONDANSETRON HCL 4 MG/2ML IJ SOLN: 4.0000 mg | Freq: Four times a day (QID) | INTRAMUSCULAR | Status: DC | PRN

## 2014-12-20 MED ORDER — SODIUM CHLORIDE 0.9 % IJ SOLN: 3.0000 mL | INTRAMUSCULAR | Status: DC | PRN

## 2014-12-20 MED ORDER — METHIMAZOLE 5 MG PO TABS
5.0000 mg | ORAL_TABLET | ORAL | Status: DC
  Administered 2014-12-21: 5 mg via ORAL
  Filled 2014-12-20: qty 1

## 2014-12-20 MED ORDER — ENOXAPARIN SODIUM 40 MG/0.4ML ~~LOC~~ SOLN
40.0000 mg | SUBCUTANEOUS | Status: DC
  Administered 2014-12-20 – 2014-12-21 (×2): 40 mg via SUBCUTANEOUS
  Filled 2014-12-20 (×3): qty 0.4

## 2014-12-20 NOTE — H&P (Signed)
[facility] History and Physical  Rebecca Skinner ELF:[id number] DOB: Jan 01, 1923 DOA: 12/20/2014  Referring physician: Larose Kells PCP: Kathlene November, MD   Chief Complaint: leg swelling  HPI: Rebecca Skinner is a 81 y.o. female  Directly admitted with Dr. Ethel Rana office for leg edema and dyspnea.  Has h/o HTN, CAD, AVR (bioprosthetic), PAF, DM. Denies CP, palpitations, orthopnea, PND. No previous h/o CHF. Echo in 2015 showed ef 50%. On lasix for edema per her report.  Review of Systems:  Complete systems reviewed. As above otherwise negative.  Past Medical History  Diagnosis Date  . Diabetes mellitus   . Hypertension   . Hyperlipidemia   . CAD (coronary artery disease)     s/p CABG s/p AO valve replacement ([place] [facility])  . Recurrent UTI   . Paroxysmal atrial fibrillation     cards d/c coumadin 12/2008 d/t persisten NSR and frequent falls- restarted coumadin july 2011, now on Eliquis  . Hyperthyroidism   . Dry skin   . Osteoarthritis   . Osteopenia     per dexa 12/09  . Keloid     @ chest  . Dizziness     Chronic, admiet 07-2010,saw neuro, thought to be a peripheral issue   . Anxiety 11/20/2011  . CVA (cerebral infarction) 08-2013    multiple, L, d/t Afib, started eloquis  . Memory loss   . Asthma     UNDER THE CARE OPF DR PAZ   Past Surgical History  Procedure Laterality Date  . Abdominal hysterectomy    . Total knee arthroplasty  1999  . Oophorectomy    . Cagb  1998  . Cesarean section      x 2  . Hemorrhoid surgery    . Colonoscopy with propofol N/A 03/30/2014    Procedure: COLONOSCOPY WITH PROPOFOL;  Surgeon: Irene Shipper, MD;  Location: [facility];  Service: Endoscopy;  Laterality: N/A;  . Aortic valve replacement  1998  . Coronary artery bypass graft  1998    SVG TO RCA  . Transthoracic echocardiogram  09/11/11    LVEF >55%.STAGE 1 DIASTOLIC DYSFUNCTION,ELEVATED LV FILLING PRESSURE.BIOPROSTHETIC AORTIC VALVE-PEAK AND MEAN GRADIENTS OF 17 MMHG AND 8 MMHG.SIGMOID  SEPTUM.MILD TO MOD MR.MILD TO MOD TR.RVSP 46 MMHG.  . Myocardial perfusion study  12/19/09    NORMAL PATTERN OF PERFUSION IN ALL REGIONS.EF 75%.  . Carotid doppler  07/13/12    BILATERAL BULB/PROXIMAL ICAS;MILD AMOUNT OF FIBROUS PLAQUE WITH NO DIAMETER REDUCTION.   Social History:  reports that she has quit smoking. She has never used smokeless tobacco. She reports that she does not drink alcohol or use illicit drugs. grandson lives with pt. Uses walker  Allergies  Allergen Reactions  . Hydrocodone     REACTION: itching/nausea  . Tramadol Hcl     REACTION: itching , upset stomach (08-2009)    Family History  Problem Relation Age of Onset  . Colon cancer Neg Hx   . Breast cancer Neg Hx   . Thyroid disease Neg Hx   . Coronary artery disease Son 77    deceased  . Heart attack Father   . Hypertension Brother   . Stroke Brother     Prior to Admission medications   Medication Sig Start Date End Date Taking? Authorizing Provider  acetaminophen (TYLENOL) 500 MG tablet Take 1,000 mg by mouth every 6 (six) hours as needed for moderate pain (pain).    Historical Provider, MD  albuterol (PROVENTIL HFA;VENTOLIN HFA) 108 (90  BASE) MCG/ACT inhaler Inhale 2 puffs into the lungs every 6 (six) hours as needed for wheezing or shortness of breath (wheezing). 08/02/14   Colon Branch, MD  ALPRAZolam Duanne Moron) 0.5 MG tablet Take 0.5 mg by mouth at bedtime as needed for anxiety or sleep (insomnia).     Historical Provider, MD  aspirin 81 MG tablet Take 81 mg by mouth daily.    Historical Provider, MD  atorvastatin (LIPITOR) 40 MG tablet Take 1 tablet (40 mg total) by mouth at bedtime. 11/04/14   Colon Branch, MD  beclomethasone (QVAR) 80 MCG/ACT inhaler Inhale 2 puffs into the lungs 2 (two) times daily.    Historical Provider, MD  benazepril (LOTENSIN) 20 MG tablet TAKE 1 TABLET (20 MG TOTAL) BY MOUTH DAILY. 11/10/14   Troy Sine, MD  Calcium Carbonate (CALCIUM 500 PO) Take 500 mg by mouth at bedtime.      Historical Provider, MD  cholecalciferol (VITAMIN D) 1000 UNITS tablet Take 1,000 Units by mouth daily.      Historical Provider, MD  clopidogrel (PLAVIX) 75 MG tablet Take 1 tablet (75 mg total) by mouth daily. 05/12/14   Troy Sine, MD  diltiazem (CARDIZEM SR) 120 MG 12 hr capsule Take 120 mg by mouth daily. 11/16/14   Historical Provider, MD  docusate sodium (COLACE) 100 MG capsule Take 1 capsule (100 mg total) by mouth daily. 03/25/14   Pamella Pert, MD  dorzolamide-timolol (COSOPT) 22.3-6.8 MG/ML ophthalmic solution  03/23/14   Historical Provider, MD  esomeprazole (NEXIUM) 40 MG capsule Take 1 capsule (40 mg total) by mouth daily before breakfast. 10/17/14   Colon Branch, MD  furosemide (LASIX) 20 MG tablet TAKE 1 TABLET (20 MG TOTAL) BY MOUTH DAILY. 04/18/14   Colon Branch, MD  glucose blood test strip 1 each by Other route as needed for other (blood sugar test). Use as instructed    Historical Provider, MD  JANUVIA 50 MG tablet TAKE 1 TABLET BY MOUTH EVERY DAY 07/13/14   Colon Branch, MD  methimazole (TAPAZOLE) 5 MG tablet TAKE 1 TABLET BY MOUTH EVERY MONDAY, WEDNESDAY AND FRIDAY. APPOINTMENT NEEDED FOR FURTHER REFILLS 12/13/14   Renato Shin, MD  metoprolol tartrate (LOPRESSOR) 25 MG tablet TAKE 1 TABLET BY MOUTH 3 TIMES A DAY 08/09/14   Colon Branch, MD  nitroGLYCERIN (NITROSTAT) 0.4 MG SL tablet Place 0.4 mg under the tongue every 5 (five) minutes as needed for chest pain (chest pain).     Historical Provider, MD  [facility] DELICA LANCETS 62I MISC Test blood sugars no more than twice daily. Patient not taking: Reported on 12/20/2014 11/04/14   Colon Branch, MD  polyethylene glycol [facility] Floria Raveling) packet Take 17 g by mouth daily. HOLD FOR DIARRHEA 04/01/14   Barton Dubois, MD  TRAVATAN Z 0.004 % SOLN ophthalmic solution Place 1 drop into both eyes at bedtime.  06/16/13   Historical Provider, MD   Physical Exam: Filed Vitals:   12/20/14 1329  BP: 164/64  Pulse: 61  Temp: 98.2 F (36.8 C)    TempSrc: Oral  Weight: 78.064 kg (172 lb 1.6 oz)  SpO2: 100%    Wt Readings from Last 3 Encounters:  12/20/14 78.064 kg (172 lb 1.6 oz)  12/20/14 78.472 kg (173 lb)  09/12/14 74.617 kg (164 lb 8 oz)    BP 151/52 mmHg  Pulse 58  Temp(Src) 98.3 F (36.8 C) (Oral)  Resp 18  Ht 5\' 2"  (1.575 m)  Wt 78.064 kg (172 lb 1.6 oz)  BMI 31.47 kg/m2  SpO2 100%  General Appearance:    Alert, cooperative, no distress, appears stated age  Head:    Normocephalic, without obvious abnormality, atraumatic  Eyes:    PERRL, conjunctiva/corneas clear, EOM's intact  Nose:   Nares normal, septum midline, mucosa normal, no drainage    or sinus tenderness  Throat:   Lips, mucosa, and tongue normal; teeth and gums normal  Neck:   Supple, symmetrical, trachea midline, no adenopathy;    thyroid:  no enlargement/tenderness/nodules; no carotid   bruit or JVD  Back:     Symmetric, no curvature, ROM normal, no CVA tenderness  Lungs:     Clear to auscultation bilaterally, respirations unlabored  Chest Wall:    No tenderness or deformity   Heart:    Regular rate and rhythm, S1 and S2 normal, no murmur, rub   or gallop  Abdomen:     Soft, non-tender, bowel sounds active all four quadrants,    no masses, no organomegaly  Genitalia:    deferred  Rectal:    deferred  Extremities:   Extremities normal, atraumatic, no cyanosis 2+ edema  Pulses:   2+ and symmetric all extremities  Skin:   Skin color, texture, turgor normal, no rashes or lesions  Lymph nodes:   Cervical, supraclavicular, and axillary nodes normal  Neurologic:   CNII-XII intact, normal strength, sensation and reflexes    throughout             Psych: normal affect  Labs on Admission:  Basic Metabolic Panel: No results for input(s): NA, K, CL, CO2, GLUCOSE, BUN, CREATININE, CALCIUM, MG, PHOS in the last 168 hours. Liver Function Tests: No results for input(s): AST, ALT, ALKPHOS, BILITOT, PROT, ALBUMIN in the last 168 hours. No results for  input(s): LIPASE, AMYLASE in the last 168 hours. No results for input(s): AMMONIA in the last 168 hours. CBC: No results for input(s): WBC, NEUTROABS, HGB, HCT, MCV, PLT in the last 168 hours. Cardiac Enzymes: No results for input(s): CKTOTAL, CKMB, CKMBINDEX, TROPONINI in the last 168 hours.  BNP (last 3 results) No results for input(s): BNP in the last 8760 hours.  ProBNP (last 3 results) No results for input(s): PROBNP in the last 8760 hours.  CBG: No results for input(s): GLUCAP in the last 168 hours.  Radiological Exams on Admission: No results found.  EKG: from Dr. Larose Kells' office reviewed. NSR  Assessment/Plan  Principal Problem:   Edema: check echo, BNP,TSH, doppler legs, UA for protein.  IV lasix bid x 4 doses Active Problems:   Dyspnea: appears comfortable, normal lung exam. See above. Check CXR   Dyslipidemia on statin   Essential hypertension: continue oupt medications   CAD (coronary artery disease) of artery bypass graft: no CP   ATRIAL FIBRILLATION, PAROXYSMAL: not on anticoagulation due to falls   Intrinsic asthma: lungs CTA   S/P AVR (aortic valve replacement). Bioprosthetic.   DM type 2 (diabetes mellitus, type 2): check hgb a1c   Code Status: full Family Communication: daughters at bedsid Disposition Plan: likely 1-3 day admission  Time spent: 75 min  [place] [employer] [email or web address] password [facility]

## 2014-12-20 NOTE — Evaluation (Signed)
Physical Therapy Evaluation Patient Details Name: Rebecca Skinner MRN: [id number] DOB: April 15, 1923 Today's Date: 12/20/2014   History of Present Illness  81 y.o. female admitted to [facility] on 12/20/14 due to LE edema.  Pt with significant PMHx of HTN, CAD, PAF, OA, dizziness, anxiety, CVA, momory loss, SOB, DM, TKA, AVR, and CABG.   Clinical Impression  Pt's most significant limiting factor is DOE with gait despite O2 sats in the 90s and HR stable during gait.  She is balanced walking with a walker which is her baseline and per daughter has not fallen "in a long time".  Pt is likely pretty close to her baseline, but would benefit from PT to follow her acutely and provide HEP for pt to preform on her own at home as she admits to a sedentary lifestyle.   PT to follow acutely for deficits listed below.       Follow Up Recommendations No PT follow up    Equipment Recommendations  None recommended by PT    Recommendations for Other Services   NA    Precautions / Restrictions   None     Mobility  Bed Mobility Overal bed mobility: Needs Assistance Bed Mobility: Supine to Sit     Supine to sit: Min assist     General bed mobility comments: Min hand held assist to pull herself to EOB.   Transfers Overall transfer level: Needs assistance Equipment used: Rolling walker (2 wheeled) Transfers: Sit to/from Stand Sit to Stand: Min guard         General transfer comment: Min guard assist for safety during transitions.   Ambulation/Gait Ambulation/Gait assistance: Min guard Ambulation Distance (Feet): 200 Feet Assistive device: Rolling walker (2 wheeled) Gait Pattern/deviations: Step-through pattern;Shuffle Gait velocity: decreased Gait velocity interpretation: Below normal speed for age/gender General Gait Details: decreased gait speed and 3-4 standing rest breaks due to 2-3/4 DOE during gait. HR and O2 sats stable despite dyspnea.                 Pertinent Vitals/Pain Pain  Assessment: No/denies pain    Home Living Family/patient expects to be discharged to:: Private residence Living Arrangements: Other relatives (29 y.o. grandson who works) Available Help at Discharge: Family;Available PRN/intermittently Type of Home: House       Home Layout: One level Home Equipment: [place] - 2 wheels;Walker - 4 wheels      Prior Function Level of Independence: Independent with assistive device(s)         Comments: Pt uses RW where she can in the house and at all times for community ambulation.          Extremity/Trunk Assessment   Upper Extremity Assessment: Overall WFL for tasks assessed           Lower Extremity Assessment: Generalized weakness         Communication   Communication: No difficulties  Cognition Arousal/Alertness: Awake/alert Behavior During Therapy: WFL for tasks assessed/performed Overall Cognitive Status: Within Functional Limits for tasks assessed       Memory: Decreased short-term memory (per chart memory deficits, no obvious on eval)                 Exercises General Exercises - Lower Extremity Long Arc Quad: AROM;Both;10 reps;Seated Hip Flexion/Marching: AROM;Both;10 reps;Seated Toe Raises: AROM;Both;10 reps;Seated Heel Raises: AROM;Both;10 reps;Seated      Assessment/Plan    PT Assessment Patient needs continued PT services  PT Diagnosis Difficulty walking;Abnormality of gait;Generalized weakness  PT Problem List Decreased strength;Decreased activity tolerance;Decreased balance;Decreased mobility;Decreased knowledge of use of DME;Cardiopulmonary status limiting activity  PT Treatment Interventions DME instruction;Gait training;Functional mobility training;Therapeutic activities;Therapeutic exercise;Balance training;Neuromuscular re-education;Patient/family education   PT Goals (Current goals can be found in the Care Plan section) Acute Rehab PT Goals Patient Stated Goal: to feel better and go home PT  Goal Formulation: With patient/family Time For Goal Achievement: 01/03/15 Potential to Achieve Goals: Good    Frequency Min 3X/week    End of Session Equipment Utilized During Treatment: Gait belt Activity Tolerance: Patient limited by fatigue;Treatment limited secondary to medical complications (Comment) (limited by DOE) Patient left: in chair;with call bell/phone within reach;with family/visitor present           Time: 1572-6203 PT Time Calculation (min) (ACUTE ONLY): 16 min   Charges:   PT Evaluation $Initial PT Evaluation Tier I: 1 Procedure          Harriett Azar B. [place], [place], DPT [phone]   12/20/2014, 4:38 PM

## 2014-12-20 NOTE — Progress Notes (Signed)
Pre visit review using our clinic review tool, if applicable. No additional management support is needed unless otherwise documented below in the visit note. 

## 2014-12-20 NOTE — Progress Notes (Signed)
Subjective:    Patient ID: Rebecca Skinner, female    DOB: [birth year], 81 y.o.   MRN: [id number]  DOS:  12/20/2014 Type of visit - description : Acute, here with her daughter Interval history: Chief complaint today is lower extremity edema for 5 days. She's not taking any new medications, has not change her diet.  Chart is reviewed, since April she has gained approximately 9 pounds, not sure if she gained weight recently or gradually. She admits to DOE to a few steps, that this started approximately 2 or 3 weeks ago. No shortness of breath at rest She has occasional chest pain, that is not a new problem, it happens approximately once a day and last few seconds only. She uses 2 pillows to sleep and that has not changed. Denies nausea, vomiting, diarrhea.    Review of Systems See history of present illness  Past Medical History  Diagnosis Date  . Diabetes mellitus   . Hypertension   . Hyperlipidemia   . CAD (coronary artery disease)     s/p CABG s/p AO valve replacement ([place] [facility])  . Recurrent UTI   . Paroxysmal atrial fibrillation     cards d/c coumadin 12/2008 d/t persisten NSR and frequent falls- restarted coumadin july 2011, now on Eliquis  . Hyperthyroidism   . Dry skin   . Osteoarthritis   . Osteopenia     per dexa 12/09  . Keloid     @ chest  . Dizziness     Chronic, admiet 07-2010,saw neuro, thought to be a peripheral issue   . Anxiety 11/20/2011  . CVA (cerebral infarction) 08-2013    multiple, L, d/t Afib, started eloquis  . Memory loss   . Asthma     [facility] DR Ericah Scotto    Past Surgical History  Procedure Laterality Date  . Abdominal hysterectomy    . Total knee arthroplasty  1999  . Oophorectomy    . Cagb  1998  . Cesarean section      x 2  . Hemorrhoid surgery    . Colonoscopy with propofol N/A 03/30/2014    Procedure: COLONOSCOPY WITH PROPOFOL;  Surgeon: Irene Shipper, MD;  Location: [facility];  Service: Endoscopy;  Laterality: N/A;  .  Aortic valve replacement  1998  . Coronary artery bypass graft  1998    SVG TO RCA  . Transthoracic echocardiogram  09/11/11    LVEF >55%.STAGE 1 DIASTOLIC DYSFUNCTION,ELEVATED LV FILLING PRESSURE.BIOPROSTHETIC AORTIC VALVE-PEAK AND MEAN GRADIENTS OF 17 MMHG AND 8 MMHG.SIGMOID SEPTUM.MILD TO MOD MR.MILD TO MOD TR.RVSP 46 MMHG.  . Myocardial perfusion study  12/19/09    NORMAL PATTERN OF PERFUSION IN ALL REGIONS.EF 75%.  . Carotid doppler  07/13/12    BILATERAL BULB/PROXIMAL ICAS;MILD AMOUNT OF FIBROUS PLAQUE WITH NO DIAMETER REDUCTION.    History   Social History  . Marital Status: Divorced    Spouse Name: N/A  . Number of Children: 4  . Years of Education: N/A   Occupational History  . retired     Social History Main Topics  . Smoking status: Former Research scientist (life sciences)  . Smokeless tobacco: Never Used     Comment: quit 2004, smoked 1.5 ppd  . Alcohol Use: No  . Drug Use: No  . Sexual Activity: Not on file   Other Topics Concern  . Not on file   Social History Narrative   Back living at her house since the last visit, [place] lives w/  her (81 y/o)   Had 3 daughter- 2 son  (lost oldest daughter and son), 2 living daughters in [place]        Medication List       This list is accurate as of: 12/20/14 11:11 AM.  Always use your most recent med list.               acetaminophen 500 MG tablet  Commonly known as:  TYLENOL  Take 1,000 mg by mouth every 6 (six) hours as needed for moderate pain (pain).     albuterol 108 (90 BASE) MCG/ACT inhaler  Commonly known as:  PROVENTIL HFA;VENTOLIN HFA  Inhale 2 puffs into the lungs every 6 (six) hours as needed for wheezing or shortness of breath (wheezing).     ALPRAZolam 0.5 MG tablet  Commonly known as:  XANAX  Take 0.5 mg by mouth at bedtime as needed for anxiety or sleep (insomnia).     aspirin 81 MG tablet  Take 81 mg by mouth daily.     atorvastatin 40 MG tablet  Commonly known as:  LIPITOR  Take 1 tablet (40 mg total) by mouth  at bedtime.     benazepril 20 MG tablet  Commonly known as:  LOTENSIN  TAKE 1 TABLET (20 MG TOTAL) BY MOUTH DAILY.     CALCIUM 500 PO  Take 500 mg by mouth at bedtime.     cholecalciferol 1000 UNITS tablet  Commonly known as:  VITAMIN D  Take 1,000 Units by mouth daily.     clopidogrel 75 MG tablet  Commonly known as:  PLAVIX  Take 1 tablet (75 mg total) by mouth daily.     diltiazem 120 MG 24 hr capsule  Commonly known as:  CARDIZEM CD  Take 1 capsule (120 mg total) by mouth daily. Patient needs to contact office to set up an appointment     docusate sodium 100 MG capsule  Commonly known as:  COLACE  Take 1 capsule (100 mg total) by mouth daily.     dorzolamide-timolol 22.3-6.8 MG/ML ophthalmic solution  Commonly known as:  COSOPT     esomeprazole 40 MG capsule  Commonly known as:  NEXIUM  Take 1 capsule (40 mg total) by mouth daily before breakfast.     furosemide 20 MG tablet  Commonly known as:  LASIX  TAKE 1 TABLET (20 MG TOTAL) BY MOUTH DAILY.     glucose blood test strip  1 each by Other route as needed for other (blood sugar test). Use as instructed     JANUVIA 50 MG tablet  Generic drug:  sitaGLIPtin  TAKE 1 TABLET BY MOUTH EVERY DAY     methimazole 5 MG tablet  Commonly known as:  TAPAZOLE  TAKE 1 TABLET BY MOUTH EVERY MONDAY, WEDNESDAY AND FRIDAY. APPOINTMENT NEEDED FOR FURTHER REFILLS     metoprolol tartrate 25 MG tablet  Commonly known as:  LOPRESSOR  TAKE 1 TABLET BY MOUTH 3 TIMES A DAY     nitroGLYCERIN 0.4 MG SL tablet  Commonly known as:  NITROSTAT  Place 0.4 mg under the tongue every 5 (five) minutes as needed for chest pain (chest pain).     ONETOUCH DELICA LANCETS 02V Misc  Test blood sugars no more than twice daily.     polyethylene glycol packet  Commonly known as:  MIRALAX / GLYCOLAX  Take 17 g by mouth daily. HOLD FOR DIARRHEA     QVAR 80 MCG/ACT inhaler  Generic drug:  beclomethasone  Inhale 2 puffs into the lungs 2 (two) times  daily.     TRAVATAN Z 0.004 % Soln ophthalmic solution  Generic drug:  Travoprost (BAK Free)  Place 1 drop into both eyes at bedtime.           Objective:   Physical Exam BP 128/78 mmHg  Pulse 70  Temp(Src) 97.6 F (36.4 C) (Oral)  Ht 5\' 2"  (1.575 m)  Wt 173 lb (78.472 kg)  BMI 31.63 kg/m2  SpO2 99% General:   Well developed, well nourished . NAD.  HEENT:  Normocephalic . Face symmetric, atraumatic Neck: + JVD at 45 Lungs:  Decreased breath sounds at bases. Some audible wheezing Normal respiratory effort, no intercostal retractions, no accessory muscle use. Dyspnea observe by transferring from the chair to the table Heart: Irregular, + systolic murmur  +/+++ pretibial edema bilaterally  Abdomen:  Not distended, soft, non-tender. No rebound or rigidity. No mass,organomegaly Skin: Not pale. Not jaundice Neurologic:  alert & oriented X3.  Speech normal, gait appropriate for age and unassisted Psych--  Cognition and judgment appear intact.  Cooperative with normal attention span and concentration.  Behavior appropriate. No anxious or depressed appearing.       Assessment & Plan:    81 year old lady with history of CAD, atrial fibrillation, aortic valve replacement presents with lower extremity edema, increasing dyspnea on exertion. On physical exam there is evidence of vol overload. EKG today shows atrial fibrillation rate 64. She is not a candidate for anticoagulation except Plavix. Suspect new onset CHF (echocardiogram showed normal LV function last year) Case  discuss with hospitalist, admit to telemetry for further eval and treatment.

## 2014-12-20 NOTE — Progress Notes (Signed)
VASCULAR LAB PRELIMINARY  PRELIMINARY  PRELIMINARY  PRELIMINARY  Bilateral lower extremity venous duplex  completed.    Preliminary report:  Bilateral:  No evidence of DVT, superficial thrombosis, or Baker's Cyst.    Reakwon Barren, RVT 12/20/2014, 4:55 PM

## 2014-12-20 NOTE — Patient Instructions (Signed)
Please go to [facility] Admissions area. Their number is [phone] if you need directions.

## 2014-12-21 ENCOUNTER — Inpatient Hospital Stay (HOSPITAL_COMMUNITY): Payer: Medicare Other

## 2014-12-21 ENCOUNTER — Other Ambulatory Visit (HOSPITAL_COMMUNITY): Payer: Medicare Other

## 2014-12-21 DIAGNOSIS — I10 Essential (primary) hypertension: Secondary | ICD-10-CM

## 2014-12-21 DIAGNOSIS — I4891 Unspecified atrial fibrillation: Secondary | ICD-10-CM

## 2014-12-21 DIAGNOSIS — I25810 Atherosclerosis of coronary artery bypass graft(s) without angina pectoris: Secondary | ICD-10-CM

## 2014-12-21 DIAGNOSIS — R609 Edema, unspecified: Secondary | ICD-10-CM

## 2014-12-21 DIAGNOSIS — R0600 Dyspnea, unspecified: Secondary | ICD-10-CM

## 2014-12-21 DIAGNOSIS — E785 Hyperlipidemia, unspecified: Secondary | ICD-10-CM

## 2014-12-21 DIAGNOSIS — E118 Type 2 diabetes mellitus with unspecified complications: Secondary | ICD-10-CM

## 2014-12-21 LAB — GLUCOSE, CAPILLARY
GLUCOSE-CAPILLARY: 89 mg/dL (ref 65–99)
Glucose-Capillary: 105 mg/dL — ABNORMAL HIGH (ref 65–99)
Glucose-Capillary: 117 mg/dL — ABNORMAL HIGH (ref 65–99)
Glucose-Capillary: 90 mg/dL (ref 65–99)

## 2014-12-21 LAB — TROPONIN I
TROPONIN I: 0.03 ng/mL (ref <0.031)
Troponin I: 0.03 ng/mL (ref <0.031)
Troponin I: 0.03 ng/mL (ref <0.031)

## 2014-12-21 MED ORDER — INSULIN ASPART 100 UNIT/ML ~~LOC~~ SOLN: 0.0000 [IU] | Freq: Three times a day (TID) | SUBCUTANEOUS | Status: DC

## 2014-12-21 MED ORDER — OFF THE BEAT BOOK
Freq: Once | Status: DC
  Filled 2014-12-21: qty 1

## 2014-12-21 MED ORDER — INSULIN ASPART 100 UNIT/ML ~~LOC~~ SOLN: 0.0000 [IU] | Freq: Every day | SUBCUTANEOUS | Status: DC

## 2014-12-21 NOTE — Progress Notes (Signed)
[facility]                                                                              Patient Demographics  Rebecca Skinner, is a 81 y.o. female, DOB - 11-07-1922, ZOX:[id number]  Admit date - 12/20/2014   Admitting Physician Delfina Redwood, MD  Outpatient Primary MD for the patient is Kathlene November, MD  LOS - 1   Chief complaint; leg swelling      Brief HPI   Rebecca Skinner is a 81 y.o. female  Directly admitted with Dr. Ethel Rana office for leg edema and dyspnea. Has h/o HTN, CAD, AVR (bioprosthetic), PAF, DM. Denies CP, palpitations, orthopnea, PND. No previous h/o CHF. Echo in 2015 showed ef 50%. On lasix for edema per her report.   Assessment & Plan    Principal Problem:  Peripheral Edema with the dyspnea: Possibly due to acute CHF, elevated BNP 710 at the time of admission, chest x-ray showed cardiomegaly, peripheral edema improving today - Doppler ultrasound of the lower extremity negative for DVT - Patient started on IV Lasix, continue strict I's and O's and daily weights - 2-D echo pending, echo in 2015 had shown EF of 50%. -TSH normal 1.4    Active Problems:   Dyslipidemia - Continue Lipitor    Essential hypertension - Currently stable, continue Lotensin, Lopressor, Cardizem    CAD (coronary artery disease) of artery bypass graft - Currently no chest pain, no troponins done, ordered - Follow 2-D echo- continue aspirin, Lipitor, benazepril, beta blocker, Cardizem    ATRIAL FIBRILLATION, PAROXYSMAL - Currently rate controlled, continue Cardizem and beta blocker - CAHDS vasc score 4 - Reviewed previous cardiology notes by Dr. Claiborne Billings, patient's primary cardiologist, she was placed on lower dose of eliquis 2.5mg  BID in 09/2013 , however patient was admitted in 10/15 for GI bleeding. Subsequently due to GI bleeding, she was not considered a candidate for Coumadin or NOAC. She was placed on aspirin and Plavix.      Intrinsic asthma - Currently stable,  continue Pulmicort, albuterol as needed    S/P AVR (aortic valve replacement) - Continue aspirin and Plavix    DM type 2 (diabetes mellitus, type 2) - Continue tradjenta, add sliding scale insulin   Thyrotoxicosis  Continue methimazole, TSH normal    Code Status: Full code  Family Communication: Discussed in detail with the patient, all imaging results, lab results explained to the patient    Disposition Plan:   Time Spent in minutes   25 minutes  Procedures  none   Consults   None   DVT Prophylaxis   Lovenox   Medications  Scheduled Meds: . aspirin  81 mg Oral Daily  . atorvastatin  40 mg Oral QHS  . benazepril  20 mg Oral Daily  . budesonide (PULMICORT) nebulizer solution  0.25 mg Nebulization BID  . clopidogrel  75 mg Oral Daily  . diltiazem  120 mg Oral Daily  . dorzolamide-timolol  1 drop Both Eyes BID  . enoxaparin (LOVENOX) injection  40 mg Subcutaneous Q24H  . furosemide  40 mg Intravenous BID  . latanoprost  1 drop  Both Eyes QHS  . linagliptin  5 mg Oral Daily  . methimazole  5 mg Oral Q M,W,F  . metoprolol tartrate  25 mg Oral TID  . pantoprazole  40 mg Oral Daily  . polyethylene glycol  17 g Oral Daily  . sodium chloride  3 mL Intravenous Q12H  . sodium chloride  3 mL Intravenous Q12H   Continuous Infusions:  PRN Meds:.sodium chloride, acetaminophen **OR** acetaminophen, albuterol, ALPRAZolam, alum & mag hydroxide-simeth, bisacodyl, nitroGLYCERIN, ondansetron **OR** ondansetron (ZOFRAN) IV, sodium chloride   Antibiotics   Anti-infectives    None        Subjective:   Rebecca Skinner was seen and examined today.  Feeling better today, lower examined edema improving, no shortness of breath. Patient denies dizziness, chest pain, abdominal pain, N/V/D/C, new weakness, numbess, tingling. No acute events overnight.    Objective:   Blood pressure 159/61, pulse 61, temperature 98.3 F (36.8 C), temperature source Oral, resp. rate 18, height 5\' 2"   (1.575 m), weight 78.1 kg (172 lb 2.9 oz), SpO2 99 %.  Wt Readings from Last 3 Encounters:  12/21/14 78.1 kg (172 lb 2.9 oz)  12/20/14 78.472 kg (173 lb)  09/12/14 74.617 kg (164 lb 8 oz)     Intake/Output Summary (Last 24 hours) at 12/21/14 1031 Last data filed at 12/21/14 1030  Gross per 24 hour  Intake    720 ml  Output   1625 ml  Net   -905 ml    Exam  General: Alert and oriented x 3, NAD  HEENT:  PERRLA, EOMI, Anicteric Sclera, mucous membranes moist.   Neck: Supple, no JVD, no masses  CVS: S1 S2 auscultated, no rubs, murmurs or gallops. Regular rate and rhythm.  Respiratory: Clear to auscultation bilaterally, no wheezing, rales or rhonchi  Abdomen: Soft, nontender, nondistended, + bowel sounds  Ext: no cyanosis clubbing, 1-2+ edema  Neuro: AAOx3, Cr N's II- XII. Strength 5/5 upper and lower extremities bilaterally  Skin: No rashes  Psych: Normal affect and demeanor, alert and oriented x3    Data Review   Micro Results No results found for this or any previous visit (from the past 240 hour(s)).  Radiology Reports X-ray Chest Pa And Lateral  12/20/2014   CLINICAL DATA:  Lower extremity leg pain, swelling, shortness of breath and wheezing. History coronary bypass.  EXAM: CHEST  2 VIEW  COMPARISON:  03/28/2014  FINDINGS: Prior median sternotomy for coronary bypass. Stable cardiomegaly with a tortuous atherosclerotic aorta. Minimal right base streaky atelectasis versus scarring. Negative for CHF or edema. No focal pneumonia, collapse or consolidation. No effusion or pneumothorax. Trachea is midline. Diffuse degenerative changes of the spine and shoulders. Aortic valve replacement noted.  IMPRESSION: Stable postoperative findings.  Cardiomegaly without CHF or pneumonia  Right base atelectasis versus scarring   Electronically Signed   By: Jerilynn Mages.  Shick M.D.   On: 12/20/2014 18:53    CBC  Recent Labs Lab 12/20/14 1517  WBC 6.4  HGB 15.3*  HCT 46.2*  PLT 210  MCV  98.9  MCH 32.8  MCHC 33.1  RDW 13.4    Chemistries   Recent Labs Lab 12/20/14 1517  NA 139  K 3.9  CL 101  CO2 28  GLUCOSE 89  BUN 13  CREATININE 1.02*  CALCIUM 9.0  AST 20  ALT 13*  ALKPHOS 95  BILITOT 0.5   ------------------------------------------------------------------------------------------------------------------ estimated creatinine clearance is 36.2 mL/min (by C-G formula based on Cr of 1.02). ------------------------------------------------------------------------------------------------------------------ No results  for input(s): HGBA1C in the last 72 hours. ------------------------------------------------------------------------------------------------------------------ No results for input(s): CHOL, HDL, LDLCALC, TRIG, CHOLHDL, LDLDIRECT in the last 72 hours. ------------------------------------------------------------------------------------------------------------------  Recent Labs  12/20/14 1517  TSH 1.445   ------------------------------------------------------------------------------------------------------------------ No results for input(s): VITAMINB12, FOLATE, FERRITIN, TIBC, IRON, RETICCTPCT in the last 72 hours.  Coagulation profile No results for input(s): INR, PROTIME in the last 168 hours.  No results for input(s): DDIMER in the last 72 hours.  Cardiac Enzymes No results for input(s): CKMB, TROPONINI, MYOGLOBIN in the last 168 hours.  Invalid input(s): CK ------------------------------------------------------------------------------------------------------------------ Invalid input(s): [place]  12/20/14 1632 12/20/14 1936 12/21/14 0724  GLUCAP 117* 116* 105*     RAI,RIPUDEEP M.D. [facility] 12/21/2014, 10:31 AM  Pager: [phone] Between 7am to 7pm - call Pager - [phone]  After 7pm go to [email or web address] - password [email or web address]  Call night coverage person covering after 7pm

## 2014-12-21 NOTE — Progress Notes (Signed)
  Echocardiogram 2D Echocardiogram has been performed.  Lysle Rubens 12/21/2014, 1:35 PM

## 2014-12-21 NOTE — Progress Notes (Signed)
UR Completed Chanah Tidmore Graves-Bigelow, RN,BSN [phone]  

## 2014-12-22 DIAGNOSIS — J45909 Unspecified asthma, uncomplicated: Secondary | ICD-10-CM

## 2014-12-22 LAB — POTASSIUM: POTASSIUM: 4.5 mmol/L (ref 3.5–5.1)

## 2014-12-22 LAB — GLUCOSE, CAPILLARY
GLUCOSE-CAPILLARY: 92 mg/dL (ref 65–99)
GLUCOSE-CAPILLARY: 115 mg/dL — AB (ref 65–99)

## 2014-12-22 LAB — BASIC METABOLIC PANEL
Anion gap: 10 (ref 5–15)
BUN: 13 mg/dL (ref 6–20)
CO2: 31 mmol/L (ref 22–32)
CREATININE: 0.97 mg/dL (ref 0.44–1.00)
Calcium: 8.8 mg/dL — ABNORMAL LOW (ref 8.9–10.3)
Chloride: 96 mmol/L — ABNORMAL LOW (ref 101–111)
GFR calc Af Amer: 58 mL/min — ABNORMAL LOW (ref >60)
GFR, EST NON AFRICAN AMERICAN: 50 mL/min — AB (ref >60)
Glucose, Bld: 93 mg/dL (ref 65–99)
POTASSIUM: 2.8 mmol/L — AB (ref 3.5–5.1)
SODIUM: 137 mmol/L (ref 135–145)

## 2014-12-22 LAB — MAGNESIUM: Magnesium: 1.6 mg/dL — ABNORMAL LOW (ref 1.7–2.4)

## 2014-12-22 LAB — HEMOGLOBIN A1C
HEMOGLOBIN A1C: 6.4 % — AB (ref 4.8–5.6)
Mean Plasma Glucose: 137 mg/dL

## 2014-12-22 MED ORDER — POTASSIUM CHLORIDE CRYS ER 20 MEQ PO TBCR: 40.0000 meq | EXTENDED_RELEASE_TABLET | Freq: Two times a day (BID) | ORAL | Status: DC

## 2014-12-22 MED ORDER — FUROSEMIDE 20 MG PO TABS: 40.0000 mg | ORAL_TABLET | Freq: Every day | ORAL | Status: DC

## 2014-12-22 MED ORDER — FUROSEMIDE 40 MG PO TABS
40.0000 mg | ORAL_TABLET | Freq: Every day | ORAL | Status: DC
  Administered 2014-12-22: 40 mg via ORAL
  Filled 2014-12-22: qty 1

## 2014-12-22 MED ORDER — POTASSIUM CHLORIDE CRYS ER 20 MEQ PO TBCR
40.0000 meq | EXTENDED_RELEASE_TABLET | ORAL | Status: AC
  Administered 2014-12-22 (×2): 40 meq via ORAL
  Filled 2014-12-22 (×2): qty 2

## 2014-12-22 MED ORDER — POTASSIUM CHLORIDE ER 10 MEQ PO TBCR: 10.0000 meq | EXTENDED_RELEASE_TABLET | Freq: Every day | ORAL | Status: DC

## 2014-12-22 MED ORDER — POTASSIUM CHLORIDE 10 MEQ/100ML IV SOLN
10.0000 meq | INTRAVENOUS | Status: AC
  Administered 2014-12-22 (×3): 10 meq via INTRAVENOUS
  Filled 2014-12-22 (×3): qty 100

## 2014-12-22 MED ORDER — MAGNESIUM SULFATE 50 % IJ SOLN
3.0000 g | Freq: Once | INTRAVENOUS | Status: AC
  Administered 2014-12-22: 3 g via INTRAVENOUS
  Filled 2014-12-22: qty 6

## 2014-12-22 MED ORDER — POTASSIUM CHLORIDE 10 MEQ/100ML IV SOLN: 10.0000 meq | INTRAVENOUS | Status: DC

## 2014-12-22 MED ORDER — MAGNESIUM OXIDE 400 MG PO TABS
400.0000 mg | ORAL_TABLET | Freq: Every day | ORAL | Status: DC
Start: 2014-12-22 — End: 2014-12-29

## 2014-12-22 NOTE — Discharge Instructions (Signed)
Shortness of Breath °Shortness of breath means you have trouble breathing. Shortness of breath needs medical care right away. °HOME CARE  °· Do not smoke. °· Avoid being around chemicals or things (paint fumes, dust) that may bother your breathing. °· Rest as needed. Slowly begin your normal activities. °· Only take medicines as told by your doctor. °· Keep all doctor visits as told. °GET HELP RIGHT AWAY IF:  °· Your shortness of breath gets worse. °· You feel lightheaded, pass out (faint), or have a cough that is not helped by medicine. °· You cough up blood. °· You have pain with breathing. °· You have pain in your chest, arms, shoulders, or belly (abdomen). °· You have a fever. °· You cannot walk up stairs or exercise the way you normally do. °· You do not get better in the time expected. °· You have a hard time doing normal activities even with rest. °· You have problems with your medicines. °· You have any new symptoms. °MAKE SURE YOU: °· Understand these instructions. °· Will watch your condition. °· Will get help right away if you are not doing well or get worse. °Document Released: 11/13/2007 Document Revised: 06/01/2013 Document Reviewed: 08/12/2011 °ExitCare® Patient Information ©2015 [employer]. This information is not intended to replace advice given to you by your health care provider. Make sure you discuss any questions you have with your health care provider. ° °

## 2014-12-22 NOTE — Research (Signed)
Patient declined to participate in the [place] at Discharge study.

## 2014-12-22 NOTE — Care Management Note (Signed)
Case Management Note  Patient Details  Name: Rebecca Skinner MRN: [id number] Date of Birth: 1923/03/17  Subjective/Objective:    Pt admitted for Peripheral Edema with the dyspnea: Possibly due to acute CHF.               Action/Plan: No needs identified by CM at this time.    Expected Discharge Date:                  Expected Discharge Plan:  Home/Self Care  In-House Referral:     Discharge planning Services  CM Consult  Post Acute Care Choice:  NA Choice offered to:  NA  DME Arranged:  N/A DME Agency:  NA  HH Arranged:  NA HH Agency:  NA  Status of Service:  Completed, signed off  Medicare Important Message Given:    Date Medicare IM Given:    Medicare IM give by:    Date Additional Medicare IM Given:    Additional Medicare Important Message give by:     If discussed at [place] of Stay Meetings, dates discussed:    Additional Comments:  Bethena Roys, RN 12/22/2014, 11:51 AM

## 2014-12-22 NOTE — Progress Notes (Signed)
Heart Failure Navigator Consult Note  Presentation: Rebecca Skinner is a 81 y.o. Female directly admitted with Dr. Ethel Skinner office for leg edema and dyspnea. Has h/o HTN, CAD, AVR (bioprosthetic), PAF, DM. Denies CP, palpitations, orthopnea, PND. No previous h/o CHF. Echo in 2015 showed ef 50%. On lasix for edema per her report.  Past Medical History  Diagnosis Date  . Hypertension   . Hyperlipidemia   . CAD (coronary artery disease)     s/p CABG s/p AO valve replacement ([place] [facility])  . Recurrent UTI   . Paroxysmal atrial fibrillation     cards d/c coumadin 12/2008 d/t persisten NSR and frequent falls- restarted coumadin july 2011, now on Eliquis  . Hyperthyroidism   . Dry skin   . Osteoarthritis   . Osteopenia     per dexa 12/09  . Keloid     @ chest  . Dizziness     Chronic, admiet 07-2010,saw neuro, thought to be a peripheral issue   . Anxiety 11/20/2011  . CVA (cerebral infarction) 08-2013    multiple, L, d/t Afib, started eloquis  . Memory loss   . Asthma     [facility] DR PAZ  . Shortness of breath dyspnea   . TIA (transient ischemic attack)   . Diabetes mellitus     type 2   . GERD (gastroesophageal reflux disease)     History   Social History  . Marital Status: Divorced    Spouse Name: N/A  . Number of Children: 4  . Years of Education: N/A   Occupational History  . retired     Social History Main Topics  . Smoking status: Former Research scientist (life sciences)  . Smokeless tobacco: Never Used     Comment: quit 2004, smoked 1.5 ppd  . Alcohol Use: No  . Drug Use: No  . Sexual Activity: Not on file   Other Topics Concern  . None   Social History Narrative   Back living at her house since the last visit, GGson lives w/ her (79 y/o)   Had 3 daughter- 2 son  (lost oldest daughter and son), 2 living daughters in [place]    ECHO:Study Conclusions--12/21/14  - Left ventricle: The cavity size was normal. There was moderate concentric hypertrophy. Systolic function was  normal. The estimated ejection fraction was in the range of 60% to 65%. Wall motion was normal; there were no regional wall motion abnormalities. Features are consistent with a pseudonormal left ventricular filling pattern, with concomitant abnormal relaxation and increased filling pressure (grade 2 diastolic dysfunction). - Aortic valve: A bioprosthesis was present. Mean gradient (S): 5 mm Hg. - Mitral valve: Calcified annulus. Mildly thickened, mildly calcified leaflets . There was mild regurgitation. - Right atrium: The atrium was moderately dilated. - Tricuspid valve: There was severe regurgitation. - Pulmonary arteries: Systolic pressure was moderately to severely increased. PA peak pressure: 63 mm Hg (S).  Impressions:  - Compared to the prior study, there has been no significant interval change.  Transthoracic echocardiography. M-mode, complete 2D, spectral Doppler, and color Doppler. Birthdate: Patient birthdate: [birth year]. Age: Patient is 81 yr old. Sex: Gender: female. BMI: 31.6 kg/m^2. Blood pressure:   128/78 Patient status: Inpatient. Study date: Study date: 12/21/2014. Study time: 12:45 PM. Location: Bedside.  BNP    Component Value Date/Time   BNP 710.3* 12/20/2014 1517    ProBNP No results found for: PROBNP   Education Assessment and Provision:  Detailed education and instructions provided on  heart failure disease management including the following:  Signs and symptoms of Heart Failure When to call the physician Importance of daily weights Low sodium diet Fluid restriction Medication management Anticipated future follow-up appointments  Patient education given on each of the above topics.  Patient acknowledges understanding and acceptance of all instructions.  I spoke with [name] and 2 daughters regarding her HF and HF recommendations.  She does not currently weigh daily--however she does have a scale and we  discussed the importance of daily weights.  She does some of the cooking with help from her adult great grandson that lives with her.  We reviewed low sodium diet and high sodium foods to avoid.  She denies any issues with getting and taking medications.  She follows with Dr. Claiborne Billings as an outpatient.  I have encouraged them to call with any concerns or questions related to her HF after discharge.  Education Materials:  "Living Better With Heart Failure" Booklet, Daily Weight Tracker Tool   High Risk Criteria for Readmission and/or Poor Patient Outcomes:   EF <30%- No-60-65% with grade 2 dias dys  2 or more admissions in 6 months- No  Difficult social situation- No-lives with great grandson--(90 years old)  Demonstrates medication noncompliance- denies   Barriers of Care:  Knowledge and compliance of her and caregivers  Discharge Planning:   Plans to discharge to home with adult great grandson

## 2014-12-22 NOTE — Discharge Summary (Signed)
Physician Discharge Summary   Patient ID: Rebecca Skinner MRN: [id number] DOB/AGE: 1922/11/28 81 y.o.  Admit date: 12/20/2014 Discharge date: 12/22/2014  Primary Care Physician:  Kathlene November, MD  Discharge Diagnoses:    . acute on chronic diastolic dysfunction/CHF  . ATRIAL FIBRILLATION, PAROXYSMAL . CAD (coronary artery disease) of artery bypass graft . Intrinsic asthma . Dyslipidemia . Essential hypertension . pulmonary hypertension  Consults:  None   Recommendations for Outpatient Follow-up:  Please follow BMET for renal function and potassium as patient's Lasix was increased   DIET: Heart healthy diet    Allergies:   Allergies  Allergen Reactions  . Hydrocodone Itching and Nausea Only  . Tramadol Hcl Itching and Nausea Only     Discharge Medications:   Medication List    TAKE these medications        acetaminophen 500 MG tablet  Commonly known as:  TYLENOL  Take 1,000 mg by mouth every 6 (six) hours as needed for moderate pain (pain).     albuterol 108 (90 BASE) MCG/ACT inhaler  Commonly known as:  PROVENTIL HFA;VENTOLIN HFA  Inhale 2 puffs into the lungs every 6 (six) hours as needed for wheezing or shortness of breath (wheezing).     ALPRAZolam 0.5 MG tablet  Commonly known as:  XANAX  Take 0.5 mg by mouth at bedtime as needed for anxiety or sleep (insomnia).     aspirin 81 MG tablet  Take 81 mg by mouth daily.     atorvastatin 40 MG tablet  Commonly known as:  LIPITOR  Take 1 tablet (40 mg total) by mouth at bedtime.     benazepril 20 MG tablet  Commonly known as:  LOTENSIN  TAKE 1 TABLET (20 MG TOTAL) BY MOUTH DAILY.     CALCIUM 500 PO  Take 500 mg by mouth at bedtime.     cholecalciferol 1000 UNITS tablet  Commonly known as:  VITAMIN D  Take 1,000 Units by mouth at bedtime.     diltiazem 120 MG 24 hr capsule  Commonly known as:  CARDIZEM CD  Take 120 mg by mouth daily.     dorzolamide-timolol 22.3-6.8 MG/ML ophthalmic solution  Commonly  known as:  COSOPT  Place 1 drop into both eyes 2 (two) times daily.     esomeprazole 40 MG capsule  Commonly known as:  NEXIUM  Take 1 capsule (40 mg total) by mouth daily before breakfast.     furosemide 20 MG tablet  Commonly known as:  LASIX  Take 2 tablets (40 mg total) by mouth daily.     glucose blood test strip  1 each by Other route as needed for other (blood sugar test). Use as instructed     JANUVIA 50 MG tablet  Generic drug:  sitaGLIPtin  TAKE 1 TABLET BY MOUTH EVERY DAY     magnesium oxide 400 MG tablet  Commonly known as:  MAG-OX  Take 1 tablet (400 mg total) by mouth daily.     methimazole 5 MG tablet  Commonly known as:  TAPAZOLE  TAKE 1 TABLET BY MOUTH EVERY MONDAY, WEDNESDAY AND FRIDAY. APPOINTMENT NEEDED FOR FURTHER REFILLS     metoprolol tartrate 25 MG tablet  Commonly known as:  LOPRESSOR  TAKE 1 TABLET BY MOUTH 3 TIMES A DAY     nitroGLYCERIN 0.4 MG SL tablet  Commonly known as:  NITROSTAT  Place 0.4 mg under the tongue every 5 (five) minutes as needed for chest pain (chest pain).  ONETOUCH DELICA LANCETS 28U Misc  Test blood sugars no more than twice daily.     potassium chloride 10 MEQ tablet  Commonly known as:  K-DUR  Take 1 tablet (10 mEq total) by mouth daily.     QVAR 80 MCG/ACT inhaler  Generic drug:  beclomethasone  Inhale 2 puffs into the lungs 2 (two) times daily.     TRAVATAN Z 0.004 % Soln ophthalmic solution  Generic drug:  Travoprost (BAK Free)  Place 1 drop into both eyes at bedtime.         Brief H and P: For complete details please refer to admission H and P, but in brief [name] is a 81 y.o. female  Directly admitted with Dr. Ethel Rana office for leg edema and dyspnea. Has h/o HTN, CAD, AVR (bioprosthetic), PAF, DM. Denies CP, palpitations, orthopnea, PND. No previous h/o CHF. Echo in 2015 showed ef 50%. On lasix for edema per her report.  Hospital Course:   Acute on chronic diastolic CHF /Peripheral Edema with  the dyspnea: At the time of presentation elevated BNP 710, chest x-ray showed cardiomegaly, peripheral edema improving today - Doppler ultrasound of the lower extremity negative for DVT - Patient was started on IV Lasix, she tolerated it very well, peripheral edema has significantly improved, no shortness of breath at the time of discharge. Patient was transitioned to oral Lasix 40 mg daily, dose will need to be adjusted at PCP follow-up. -TSH normal 1.4  2-D echo showed EF of 60-65% with grade 2 diastolic dysfunction, PA pressure 63, moderately dilated right atrium with severe tricuspid regurgitation, no significant interval change compared with prior echo   Dyslipidemia - Continue Lipitor   Essential hypertension - Currently stable, continue Lotensin, Lopressor, Cardizem   CAD (coronary artery disease) of artery bypass graft - Currently no chest pain, no troponins done, ordered - 2-D echo showed EF of 60-65% with grade 2 diastolic dysfunction. -  continue aspirin, Lipitor, benazepril, beta blocker, Cardizem   ATRIAL FIBRILLATION, PAROXYSMAL - Currently rate controlled, continue Cardizem and beta blocker - CAHDS vasc score 4 - Reviewed previous cardiology notes by Dr. Claiborne Billings, patient's primary cardiologist, she was placed on lower dose of eliquis 2.5mg  BID in 09/2013 , however patient was admitted in 10/15 for GI bleeding. Subsequently due to GI bleeding, she was not considered a candidate for Coumadin or NOAC. She was placed on aspirin and Plavix.    Intrinsic asthma - Currently stable, continue Pulmicort, albuterol as needed   S/P AVR (aortic valve replacement) - Continue aspirin and Plavix   DM type 2 (diabetes mellitus, type 2) - Continue tradjenta, add sliding scale insulin   Thyrotoxicosis  Continue methimazole, TSH normal   Day of Discharge BP 140/58 mmHg  Pulse 62  Temp(Src) 97.9 F (36.6 C) (Oral)  Resp 18  Ht 5\' 2"  (1.575 m)  Wt 78.3 kg (172 lb 9.9 oz)   BMI 31.56 kg/m2  SpO2 95%  Physical Exam: General: Alert and awake oriented x3 not in any acute distress. HEENT: anicteric sclera, pupils reactive to light and accommodation CVS: S1-S2 clear no murmur rubs or gallops Chest: clear to auscultation bilaterally, no wheezing rales or rhonchi Abdomen: soft nontender, nondistended, normal bowel sounds Extremities: no cyanosis, clubbing, trace edema noted bilaterally Neuro: Cranial nerves II-XII intact, no focal neurological deficits   The results of significant diagnostics from this hospitalization (including imaging, microbiology, ancillary and laboratory) are listed below for reference.    LAB RESULTS: Basic  Metabolic Panel:  Recent Labs Lab 12/20/14 1517 12/22/14 0335 12/22/14 0817 12/22/14 1226  NA 139 137  --   --   K 3.9 2.8*  --  4.5  CL 101 96*  --   --   CO2 28 31  --   --   GLUCOSE 89 93  --   --   BUN 13 13  --   --   CREATININE 1.02* 0.97  --   --   CALCIUM 9.0 8.8*  --   --   MG  --   --  1.6*  --    Liver Function Tests:  Recent Labs Lab 12/20/14 1517  AST 20  ALT 13*  ALKPHOS 95  BILITOT 0.5  PROT 7.2  ALBUMIN 3.6   No results for input(s): LIPASE, AMYLASE in the last 168 hours. No results for input(s): AMMONIA in the last 168 hours. CBC:  Recent Labs Lab 12/20/14 1517  WBC 6.4  HGB 15.3*  HCT 46.2*  MCV 98.9  PLT 210   Cardiac Enzymes:  Recent Labs Lab 12/21/14 1733 12/21/14 2301  TROPONINI 0.03 0.03   BNP: Invalid input(s): POCBNP CBG:  Recent Labs Lab 12/22/14 0734 12/22/14 1132  GLUCAP 92 115*    Significant Diagnostic Studies:  X-ray Chest Pa And Lateral  12/20/2014   CLINICAL DATA:  Lower extremity leg pain, swelling, shortness of breath and wheezing. History coronary bypass.  EXAM: CHEST  2 VIEW  COMPARISON:  03/28/2014  FINDINGS: Prior median sternotomy for coronary bypass. Stable cardiomegaly with a tortuous atherosclerotic aorta. Minimal right base streaky atelectasis  versus scarring. Negative for CHF or edema. No focal pneumonia, collapse or consolidation. No effusion or pneumothorax. Trachea is midline. Diffuse degenerative changes of the spine and shoulders. Aortic valve replacement noted.  IMPRESSION: Stable postoperative findings.  Cardiomegaly without CHF or pneumonia  Right base atelectasis versus scarring   Electronically Signed   By: Jerilynn Mages.  Shick M.D.   On: 12/20/2014 18:53    2D ECHO: Study Conclusions  - Left ventricle: The cavity size was normal. There was moderate concentric hypertrophy. Systolic function was normal. The estimated ejection fraction was in the range of 60% to 65%. Wall motion was normal; there were no regional wall motion abnormalities. Features are consistent with a pseudonormal left ventricular filling pattern, with concomitant abnormal relaxation and increased filling pressure (grade 2 diastolic dysfunction). - Aortic valve: A bioprosthesis was present. Mean gradient (S): 5 mm Hg. - Mitral valve: Calcified annulus. Mildly thickened, mildly calcified leaflets . There was mild regurgitation. - Right atrium: The atrium was moderately dilated. - Tricuspid valve: There was severe regurgitation. - Pulmonary arteries: Systolic pressure was moderately to severely increased. PA peak pressure: 63 mm Hg (S).  Impressions:  - Compared to the prior study, there has been no significant interval change.  Disposition and Follow-up:     Discharge Instructions    (HEART FAILURE PATIENTS) Call MD:  Anytime you have any of the following symptoms: 1) 3 pound weight gain in 24 hours or 5 pounds in 1 week 2) shortness of breath, with or without a dry hacking cough 3) swelling in the hands, feet or stomach 4) if you have to sleep on extra pillows at night in order to breathe.    Complete by:  As directed      Diet Carb Modified    Complete by:  As directed      Increase activity slowly    Complete by:  As directed              DISPOSITION: Home   DISCHARGE FOLLOW-UP Follow-up Information    Follow up with Kathlene November, MD. Schedule an appointment as soon as possible for a visit in 10 days.   Specialty:  Internal Medicine   Why:  for hospital follow-up   Contact information:   [address] [place] [address] [phone]        Time spent on Discharge: 35 mins   Signed:   Monta Police M.D. [facility] 12/22/2014, 1:49 PM Pager: [phone]

## 2014-12-22 NOTE — Care Management Important Message (Signed)
Important Message  Patient Details  Name: Rebecca Skinner MRN: [id number] Date of Birth: 1923/01/15   Medicare Important Message Given:  Yes-second notification given    Pricilla Handler 12/22/2014, 2:39 PM

## 2014-12-23 ENCOUNTER — Telehealth: Payer: Self-pay | Admitting: *Deleted

## 2014-12-23 NOTE — Telephone Encounter (Signed)
Transition Care Management Follow-up Telephone Call  Admit date: 12/20/2014 Discharge date: 12/22/2014  Recommendations for Outpatient Follow-up:  Please follow BMET for renal function and potassium as patient's Lasix was increased   Date discharged? 12/22/14   How have you been since you were released from the hospital? Good- breathing is good, swelling has gone down    Do you understand why you were in the hospital? YES- heart failure    Do you understand the discharge instrcutions? YES- patient bought a scale yesterday and she states she weighs herself every morning, takes the lasix as prescribed.  She is checking blood sugars.     Where were you discharged to? Home with grandson    Items Reviewed:  Medications reviewed: YES  Allergies reviewed: YES   Dietary changes reviewed: YES - low sodium, heart healthy diet   Referrals reviewed: YES   Functional Questionnaire:   Activities of Daily Living (ADLs):   She states they are independent in the following: ambulation, restroom, cooking, bathing, dressing, medications (great-grandson is staying with her at night)  States they require assistance with the following: transportation    Any transportation issues/concerns?: NO- daughters or grandson will drive    Any patient concerns? NO   Confirmed importance and date/time of follow-up visits scheduled YES- scheduled 12/29/14  Provider Appointment booked with Dr. Larose Kells  Confirmed with patient if condition begins to worsen call PCP or go to the ER.  Patient was given the office number and encouraged to call back with question or concerns.  : YES

## 2014-12-29 ENCOUNTER — Ambulatory Visit (INDEPENDENT_AMBULATORY_CARE_PROVIDER_SITE_OTHER): Payer: Medicare Other | Admitting: Internal Medicine

## 2014-12-29 ENCOUNTER — Encounter: Payer: Self-pay | Admitting: Internal Medicine

## 2014-12-29 VITALS — BP 122/64 | HR 51 | Temp 98.2°F | Ht 62.0 in | Wt 162.0 lb

## 2014-12-29 DIAGNOSIS — I509 Heart failure, unspecified: Secondary | ICD-10-CM

## 2014-12-29 DIAGNOSIS — I5032 Chronic diastolic (congestive) heart failure: Secondary | ICD-10-CM

## 2014-12-29 LAB — BASIC METABOLIC PANEL
BUN: 20 mg/dL (ref 6–23)
CALCIUM: 9.6 mg/dL (ref 8.4–10.5)
CO2: 30 mEq/L (ref 19–32)
CREATININE: 1.06 mg/dL (ref 0.40–1.20)
Chloride: 103 mEq/L (ref 96–112)
GFR: 62.64 mL/min (ref >60.00)
Glucose, Bld: 116 mg/dL — ABNORMAL HIGH (ref 70–99)
POTASSIUM: 4.1 meq/L (ref 3.5–5.1)
Sodium: 140 mEq/L (ref 135–145)

## 2014-12-29 LAB — MAGNESIUM: MAGNESIUM: 2 mg/dL (ref 1.5–2.5)

## 2014-12-29 MED ORDER — POTASSIUM CHLORIDE ER 10 MEQ PO TBCR: 10.0000 meq | EXTENDED_RELEASE_TABLET | Freq: Every day | ORAL | Status: DC

## 2014-12-29 MED ORDER — MAGNESIUM OXIDE 400 MG PO TABS: 400.0000 mg | ORAL_TABLET | Freq: Every day | ORAL | Status: DC

## 2014-12-29 NOTE — Assessment & Plan Note (Addendum)
Recently admitted to the hospital with edema and shortness of breath, she was diuresed, echocardiogram showing 2+ diastolic dysfunction. Her weight is now 162 down from 173. Her Lasix as an outpatient was increased from 20 mg to 40 mg. She seems to be doing really well. The daughter request a home health nurse to visit her. Plan: Check a BMP, magnesium. Continue KCl -Mg supplements Continue with Lasix 40 mg. Low salt diet, daily weights Consult a home health agency, would like a nurse to visit her to help with her CHF management.

## 2014-12-29 NOTE — Progress Notes (Signed)
Pre visit review using our clinic review tool, if applicable. No additional management support is needed unless otherwise documented below in the visit note. 

## 2014-12-29 NOTE — Patient Instructions (Signed)
Get your blood work before you leave   Watch your salt intake  Weight yourself every day, if you go over 5 pounds please call the office

## 2014-12-29 NOTE — Progress Notes (Signed)
Subjective:    Patient ID: Rebecca Skinner, female    DOB: 1922-11-10, 81 y.o.   MRN: [id number]  DOS:  12/29/2014 Type of visit - description : Hospital follow-up, here with her daughter Interval history:  Admitted 12/20/2014, discharged 3 days later. She was admitted with edema, BnP was elevated at 710, ultrasounds of the legs show no DVT, patient started IV Lasix, discharged on oral Lasix (increased dose from baseline). Echocardiogram show a grade 2 diastolic dysfunction. Severe tricuspid regurgitation, unchanged from previous echos. Atrial fibrillation on rate control, not candidate for anticoagulation . Labs reviewed: Last potassium 4.5, CBC satisfactory Chest x-ray showed cardiomegaly without CHF.   Review of Systems  Since he left the hospital she is feeling very well, edema is gone, shortness of breath resolved. Denies chest pain, nausea, vomiting, diarrhea. No orthostatic dizziness although she gets slightly lightheaded from time to time. Good compliance with medication.  Past Medical History  Diagnosis Date  . Hypertension   . Hyperlipidemia   . CAD (coronary artery disease)     s/p CABG s/p AO valve replacement ([place] [facility])  . Recurrent UTI   . Paroxysmal atrial fibrillation     cards d/c coumadin 12/2008 d/t persisten NSR and frequent falls- restarted coumadin july 2011, now on Eliquis  . Hyperthyroidism   . Dry skin   . Osteoarthritis   . Osteopenia     per dexa 12/09  . Keloid     @ chest  . Dizziness     Chronic, admiet 07-2010,saw neuro, thought to be a peripheral issue   . Anxiety 11/20/2011  . CVA (cerebral infarction) 08-2013    multiple, L, d/t Afib, started eloquis  . Memory loss   . Asthma     [facility] DR Hamna Asa  . Shortness of breath dyspnea   . TIA (transient ischemic attack)   . Diabetes mellitus     type 2   . GERD (gastroesophageal reflux disease)     Past Surgical History  Procedure Laterality Date  . Abdominal hysterectomy      . Total knee arthroplasty  1999  . Oophorectomy    . Cagb  1998  . Cesarean section      x 2  . Hemorrhoid surgery    . Colonoscopy with propofol N/A 03/30/2014    Procedure: COLONOSCOPY WITH PROPOFOL;  Surgeon: Irene Shipper, MD;  Location: [facility];  Service: Endoscopy;  Laterality: N/A;  . Aortic valve replacement  1998  . Coronary artery bypass graft  1998    SVG TO RCA  . Transthoracic echocardiogram  09/11/11    LVEF >55%.STAGE 1 DIASTOLIC DYSFUNCTION,ELEVATED LV FILLING PRESSURE.BIOPROSTHETIC AORTIC VALVE-PEAK AND MEAN GRADIENTS OF 17 MMHG AND 8 MMHG.SIGMOID SEPTUM.MILD TO MOD MR.MILD TO MOD TR.RVSP 46 MMHG.  . Myocardial perfusion study  12/19/09    NORMAL PATTERN OF PERFUSION IN ALL REGIONS.EF 75%.  . Carotid doppler  07/13/12    BILATERAL BULB/PROXIMAL ICAS;MILD AMOUNT OF FIBROUS PLAQUE WITH NO DIAMETER REDUCTION.    History   Social History  . Marital Status: Divorced    Spouse Name: N/A  . Number of Children: 4  . Years of Education: N/A   Occupational History  . retired     Social History Main Topics  . Smoking status: Former Research scientist (life sciences)  . Smokeless tobacco: Never Used     Comment: quit 2004, smoked 1.5 ppd  . Alcohol Use: No  . Drug Use: No  .  Sexual Activity: Not on file   Other Topics Concern  . Not on file   Social History Narrative   Back living at her house since the last visit, GGson lives w/ her (79 y/o)   Had 3 daughter- 2 son  (lost oldest daughter and son), 2 living daughters in [place]        Medication List       This list is accurate as of: 12/29/14 11:59 PM.  Always use your most recent med list.               acetaminophen 500 MG tablet  Commonly known as:  TYLENOL  Take 1,000 mg by mouth every 6 (six) hours as needed for moderate pain (pain).     albuterol 108 (90 BASE) MCG/ACT inhaler  Commonly known as:  PROVENTIL HFA;VENTOLIN HFA  Inhale 2 puffs into the lungs every 6 (six) hours as needed for wheezing or shortness of  breath (wheezing).     ALPRAZolam 0.5 MG tablet  Commonly known as:  XANAX  Take 0.5 mg by mouth at bedtime as needed for anxiety or sleep (insomnia).     aspirin 81 MG tablet  Take 81 mg by mouth daily.     atorvastatin 40 MG tablet  Commonly known as:  LIPITOR  Take 1 tablet (40 mg total) by mouth at bedtime.     benazepril 20 MG tablet  Commonly known as:  LOTENSIN  TAKE 1 TABLET (20 MG TOTAL) BY MOUTH DAILY.     CALCIUM 500 PO  Take 500 mg by mouth at bedtime.     cholecalciferol 1000 UNITS tablet  Commonly known as:  VITAMIN D  Take 1,000 Units by mouth at bedtime.     diltiazem 120 MG 24 hr capsule  Commonly known as:  CARDIZEM CD  Take 120 mg by mouth daily.     dorzolamide-timolol 22.3-6.8 MG/ML ophthalmic solution  Commonly known as:  COSOPT  Place 1 drop into both eyes 2 (two) times daily.     esomeprazole 40 MG capsule  Commonly known as:  NEXIUM  Take 1 capsule (40 mg total) by mouth daily before breakfast.     furosemide 20 MG tablet  Commonly known as:  LASIX  Take 2 tablets (40 mg total) by mouth daily.     glucose blood test strip  1 each by Other route as needed for other (blood sugar test). Use as instructed     JANUVIA 50 MG tablet  Generic drug:  sitaGLIPtin  TAKE 1 TABLET BY MOUTH EVERY DAY     magnesium oxide 400 MG tablet  Commonly known as:  MAG-OX  Take 1 tablet (400 mg total) by mouth daily.     methimazole 5 MG tablet  Commonly known as:  TAPAZOLE  TAKE 1 TABLET BY MOUTH EVERY MONDAY, WEDNESDAY AND FRIDAY. APPOINTMENT NEEDED FOR FURTHER REFILLS     metoprolol tartrate 25 MG tablet  Commonly known as:  LOPRESSOR  TAKE 1 TABLET BY MOUTH 3 TIMES A DAY     nitroGLYCERIN 0.4 MG SL tablet  Commonly known as:  NITROSTAT  Place 0.4 mg under the tongue every 5 (five) minutes as needed for chest pain (chest pain).     ONETOUCH DELICA LANCETS 99I Misc  Test blood sugars no more than twice daily.     potassium chloride 10 MEQ tablet   Commonly known as:  K-DUR  Take 1 tablet (10 mEq total) by mouth daily.  QVAR 80 MCG/ACT inhaler  Generic drug:  beclomethasone  Inhale 2 puffs into the lungs 2 (two) times daily.     TRAVATAN Z 0.004 % Soln ophthalmic solution  Generic drug:  Travoprost (BAK Free)  Place 1 drop into both eyes at bedtime.           Objective:   Physical Exam BP 122/64 mmHg  Pulse 51  Temp(Src) 98.2 F (36.8 C) (Oral)  Ht 5\' 2"  (1.575 m)  Wt 162 lb (73.483 kg)  BMI 29.62 kg/m2  SpO2 97% General:   Well developed, well nourished . NAD.  HEENT:  Normocephalic . Face symmetric, atraumatic Lungs:  CTA B Normal respiratory effort, no intercostal retractions, no accessory muscle use. Heart: Irregular, + systolic murmur.  No pretibial edema bilaterally  Skin: Not pale. Not jaundice Neurologic:  alert & oriented X3.  Speech normal  Psych--   behavior appropriate. No anxious or depressed appearing.      Assessment & Plan:

## 2015-01-04 ENCOUNTER — Telehealth: Payer: Self-pay

## 2015-01-04 NOTE — Telephone Encounter (Signed)
Received fax confirmation on 01/04/2015 at 4:39PM.

## 2015-01-04 NOTE — Telephone Encounter (Signed)
-----   Message from Colon Branch, MD sent at 12/30/2014  8:09 PM EDT ----- Regarding: [place] [facility], needs a nurse tovisit and help w/ CHF managment (weight, low salt diet, etc)

## 2015-01-04 NOTE — Telephone Encounter (Signed)
Home health orders completed for CHF management and faxed to [facility] at [phone] with Pt's demographic sheet, insurance card, and OV notes from 12/29/2014 at last visit. Home health order forms sent for scanning into Pt's chart.

## 2015-01-13 ENCOUNTER — Telehealth: Payer: Self-pay | Admitting: Cardiovascular Disease

## 2015-01-13 NOTE — Telephone Encounter (Signed)
Rebecca Skinner called and states the the patient's refill for Diltiazem was filled incorrectly last time.  Please call

## 2015-01-19 ENCOUNTER — Other Ambulatory Visit: Payer: Self-pay | Admitting: Internal Medicine

## 2015-01-20 ENCOUNTER — Ambulatory Visit (INDEPENDENT_AMBULATORY_CARE_PROVIDER_SITE_OTHER): Payer: Medicare Other | Admitting: Endocrinology

## 2015-01-20 ENCOUNTER — Encounter: Payer: Self-pay | Admitting: Endocrinology

## 2015-01-20 VITALS — BP 132/84 | HR 80 | Temp 97.4°F | Ht 62.0 in | Wt 162.0 lb

## 2015-01-20 DIAGNOSIS — E0520 Thyrotoxicosis with toxic multinodular goiter without thyrotoxic crisis or storm: Secondary | ICD-10-CM

## 2015-01-20 NOTE — Patient Instructions (Signed)
Please continue the same methimazole.  if ever you have fever while taking methimazole, stop it and call us, because of the risk of a rare side-effect.   Please come back for a follow-up appointment in 6 months.

## 2015-01-20 NOTE — Progress Notes (Signed)
Subjective:    Patient ID: Rebecca Skinner, female    DOB: 1922-12-07, 81 y.o.   MRN: [id number]  HPI pt returns for f/u of hyperthyroidism (due to multinodular goiter; dx'ed 2008; tapazole was chosen as initial rx, due to AF; due to increasing TSH, this was reduced, then stopped altogether in 2011; also in 2011, US showed small multinodular goiter; in early 2012, hyperthyroidism recurred; the plan was for scanning, then i-131 rx, but a repeat TSH was normal. the TSH did not go low again until mid-2014; she chose to resume tapazole; in October of 2014, tapazole was reduced, due to normalization of her TFT). she takes tapazole as rx'ed. She denies tremor.  pt states she feels better in general, since her recent hospital visit, for CHF. Past Medical History  Diagnosis Date  . Hypertension   . Hyperlipidemia   . CAD (coronary artery disease)     s/p CABG s/p AO valve replacement ([place] [facility])  . Recurrent UTI   . Paroxysmal atrial fibrillation     cards d/c coumadin 12/2008 d/t persisten NSR and frequent falls- restarted coumadin july 2011, now on Eliquis  . Hyperthyroidism   . Dry skin   . Osteoarthritis   . Osteopenia     per dexa 12/09  . Keloid     @ chest  . Dizziness     Chronic, admiet 07-2010,saw neuro, thought to be a peripheral issue   . Anxiety 11/20/2011  . CVA (cerebral infarction) 08-2013    multiple, L, d/t Afib, started eloquis  . Memory loss   . Asthma     [facility] DR PAZ  . Shortness of breath dyspnea   . TIA (transient ischemic attack)   . Diabetes mellitus     type 2   . GERD (gastroesophageal reflux disease)     Past Surgical History  Procedure Laterality Date  . Abdominal hysterectomy    . Total knee arthroplasty  1999  . Oophorectomy    . Cagb  1998  . Cesarean section      x 2  . Hemorrhoid surgery    . Colonoscopy with propofol N/A 03/30/2014    Procedure: COLONOSCOPY WITH PROPOFOL;  Surgeon: Irene Shipper, MD;  Location: [facility];   Service: Endoscopy;  Laterality: N/A;  . Aortic valve replacement  1998  . Coronary artery bypass graft  1998    SVG TO RCA  . Transthoracic echocardiogram  09/11/11    LVEF >55%.STAGE 1 DIASTOLIC DYSFUNCTION,ELEVATED LV FILLING PRESSURE.BIOPROSTHETIC AORTIC VALVE-PEAK AND MEAN GRADIENTS OF 17 MMHG AND 8 MMHG.SIGMOID SEPTUM.MILD TO MOD MR.MILD TO MOD TR.RVSP 46 MMHG.  . Myocardial perfusion study  12/19/09    NORMAL PATTERN OF PERFUSION IN ALL REGIONS.EF 75%.  . Carotid doppler  07/13/12    BILATERAL BULB/PROXIMAL ICAS;MILD AMOUNT OF FIBROUS PLAQUE WITH NO DIAMETER REDUCTION.    Social History   Social History  . Marital Status: Divorced    Spouse Name: N/A  . Number of Children: 4  . Years of Education: N/A   Occupational History  . retired     Social History Main Topics  . Smoking status: Former Research scientist (life sciences)  . Smokeless tobacco: Never Used     Comment: quit 2004, smoked 1.5 ppd  . Alcohol Use: No  . Drug Use: No  . Sexual Activity: Not on file   Other Topics Concern  . Not on file   Social History Narrative   Back living at  her house since the last visit, GGson lives w/ her (79 y/o)   Had 3 daughter- 2 son  (lost oldest daughter and son), 2 living daughters in [place]    Current Outpatient Prescriptions on File Prior to Visit  Medication Sig Dispense Refill  . acetaminophen (TYLENOL) 500 MG tablet Take 1,000 mg by mouth every 6 (six) hours as needed for moderate pain (pain).    Marland Kitchen albuterol (PROVENTIL HFA;VENTOLIN HFA) 108 (90 BASE) MCG/ACT inhaler Inhale 2 puffs into the lungs every 6 (six) hours as needed for wheezing or shortness of breath (wheezing). 1 Inhaler 1  . ALPRAZolam (XANAX) 0.5 MG tablet Take 0.5 mg by mouth at bedtime as needed for anxiety or sleep (insomnia).     Marland Kitchen aspirin 81 MG tablet Take 81 mg by mouth daily.    Marland Kitchen atorvastatin (LIPITOR) 40 MG tablet Take 1 tablet (40 mg total) by mouth at bedtime. 30 tablet 3  . beclomethasone (QVAR) 80 MCG/ACT inhaler  Inhale 2 puffs into the lungs 2 (two) times daily.    . benazepril (LOTENSIN) 20 MG tablet TAKE 1 TABLET (20 MG TOTAL) BY MOUTH DAILY. 30 tablet 7  . Calcium Carbonate (CALCIUM 500 PO) Take 500 mg by mouth at bedtime.     . cholecalciferol (VITAMIN D) 1000 UNITS tablet Take 1,000 Units by mouth at bedtime.     Marland Kitchen diltiazem (CARDIZEM CD) 120 MG 24 hr capsule Take 120 mg by mouth daily.  2  . dorzolamide-timolol (COSOPT) 22.3-6.8 MG/ML ophthalmic solution Place 1 drop into both eyes 2 (two) times daily.     Marland Kitchen esomeprazole (NEXIUM) 40 MG capsule Take 1 capsule (40 mg total) by mouth daily before breakfast. 30 capsule 3  . furosemide (LASIX) 20 MG tablet Take 2 tablets (40 mg total) by mouth daily. 60 tablet 5  . glucose blood test strip 1 each by Other route as needed for other (blood sugar test). Use as instructed    . magnesium oxide (MAG-OX) 400 MG tablet Take 1 tablet (400 mg total) by mouth daily. 30 tablet 6  . methimazole (TAPAZOLE) 5 MG tablet TAKE 1 TABLET BY MOUTH EVERY MONDAY, WEDNESDAY AND FRIDAY. APPOINTMENT NEEDED FOR FURTHER REFILLS 40 tablet 0  . metoprolol tartrate (LOPRESSOR) 25 MG tablet TAKE 1 TABLET BY MOUTH 3 TIMES A DAY 90 tablet 5  . nitroGLYCERIN (NITROSTAT) 0.4 MG SL tablet Place 0.4 mg under the tongue every 5 (five) minutes as needed for chest pain (chest pain).     Glory Rosebush DELICA LANCETS 47M MISC Test blood sugars no more than twice daily. 100 each 12  . potassium chloride (K-DUR) 10 MEQ tablet Take 1 tablet (10 mEq total) by mouth daily. 30 tablet 6  . sitaGLIPtin (JANUVIA) 50 MG tablet Take 1 tablet (50 mg total) by mouth daily. 30 tablet 3  . TRAVATAN Z 0.004 % SOLN ophthalmic solution Place 1 drop into both eyes at bedtime.      No current facility-administered medications on file prior to visit.    Allergies  Allergen Reactions  . Hydrocodone Itching and Nausea Only  . Tramadol Hcl Itching and Nausea Only    Family History  Problem Relation Age of Onset    . Colon cancer Neg Hx   . Breast cancer Neg Hx   . Thyroid disease Neg Hx   . Coronary artery disease Son 41    deceased  . Heart attack Father   . Hypertension Brother   . Stroke Brother  BP 132/84 mmHg  Pulse 80  Temp(Src) 97.4 F (36.3 C) (Oral)  Ht 5\' 2"  (1.575 m)  Wt 162 lb (73.483 kg)  BMI 29.62 kg/m2  SpO2 97%  Review of Systems Denies fever    Objective:   Physical Exam VITAL SIGNS:  See vs page GENERAL: no distress NECK: small multinodular goiter.    Lab Results  Component Value Date   TSH 1.445 12/20/2014      Assessment & Plan:  Hyperthyroidism: well-controlled  Patient is advised the following: Patient Instructions  Please continue the same methimazole.  if ever you have fever while taking methimazole, stop it and call us, because of the risk of a rare side-effect.   Please come back for a follow-up appointment in 6 months.

## 2015-01-23 ENCOUNTER — Other Ambulatory Visit: Payer: Self-pay | Admitting: Cardiovascular Disease

## 2015-01-23 NOTE — Telephone Encounter (Signed)
REFILL 

## 2015-01-30 ENCOUNTER — Encounter: Payer: Medicare Other | Admitting: Internal Medicine

## 2015-02-01 DIAGNOSIS — H20011 Primary iridocyclitis, right eye: Secondary | ICD-10-CM | POA: Diagnosis not present

## 2015-02-03 DIAGNOSIS — H20011 Primary iridocyclitis, right eye: Secondary | ICD-10-CM | POA: Diagnosis not present

## 2015-02-28 ENCOUNTER — Telehealth: Payer: Self-pay | Admitting: Internal Medicine

## 2015-02-28 NOTE — Telephone Encounter (Signed)
Error

## 2015-03-01 ENCOUNTER — Telehealth: Payer: Self-pay

## 2015-03-01 NOTE — Telephone Encounter (Signed)
Appointment scheduled.

## 2015-03-11 ENCOUNTER — Ambulatory Visit (INDEPENDENT_AMBULATORY_CARE_PROVIDER_SITE_OTHER): Payer: Medicare Other

## 2015-03-11 ENCOUNTER — Encounter: Payer: Self-pay | Admitting: Family Medicine

## 2015-03-11 ENCOUNTER — Ambulatory Visit (INDEPENDENT_AMBULATORY_CARE_PROVIDER_SITE_OTHER): Payer: Medicare Other | Admitting: Family Medicine

## 2015-03-11 ENCOUNTER — Other Ambulatory Visit: Payer: Self-pay | Admitting: Endocrinology

## 2015-03-11 VITALS — BP 128/80 | HR 75 | Temp 98.0°F | Resp 16 | Ht 62.0 in | Wt 163.0 lb

## 2015-03-11 DIAGNOSIS — M79644 Pain in right finger(s): Secondary | ICD-10-CM | POA: Diagnosis not present

## 2015-03-11 DIAGNOSIS — S61011A Laceration without foreign body of right thumb without damage to nail, initial encounter: Secondary | ICD-10-CM

## 2015-03-11 NOTE — Progress Notes (Signed)
[facility] [address] [place] [address] [phone]  Date:  03/11/2015   Name:  Rebecca Skinner   DOB:  May 15, 1923   MRN:  [id number]  PCP:  Kathlene November, MD    Chief Complaint: Laceration and sore between toe   History of Present Illness:  Rebecca Skinner is a 81 y.o. very pleasant female patient who presents with the following:  Pt cut her right thumb on a can earlier today- she sustained a v shaped wound to her right thumb shortly before coming in- she was trying to open a can of beans. She did not mention any other concern to me She is OW well and unhurt today Tetanus shot UTD- 2014  Patient Active Problem List   Diagnosis Date Noted  . Chronic diastolic heart failure 11/94/1740  . Edema 12/20/2014  . DM type 2 (diabetes mellitus, type 2) 12/20/2014  . Dyspnea 12/20/2014  . Chronic atrial fibrillation 05/12/2014  . Angiectasia 03/30/2014  . GI bleed 03/28/2014  . Long term current use of anticoagulant therapy 03/28/2014  . Memory loss 09/16/2013  . Annual physical exam 11/21/2011  . ATRIAL FIBRILLATION, PAROXYSMAL 03/05/2007  . Diabetes mellitus with circulatory complication 81/44/8185  . S/P AVR (aortic valve replacement) 10/15/2006  . Thyrotoxicosis 09/03/2006  . Dyslipidemia 09/03/2006  . Essential hypertension 09/03/2006  . CAD (coronary artery disease) of artery bypass graft 09/03/2006  . Intrinsic asthma 09/03/2006    Past Medical History  Diagnosis Date  . Hypertension   . Hyperlipidemia   . CAD (coronary artery disease)     s/p CABG s/p AO valve replacement ([place] [facility])  . Recurrent UTI   . Paroxysmal atrial fibrillation ([place])     cards d/c coumadin 12/2008 d/t persisten NSR and frequent falls- restarted coumadin july 2011, now on Eliquis  . Hyperthyroidism   . Dry skin   . Osteoarthritis   . Osteopenia     per dexa 12/09  . Keloid     @ chest  . Dizziness     Chronic, admiet 07-2010,saw neuro, thought to be a peripheral  issue   . Anxiety 11/20/2011  . CVA (cerebral infarction) 08-2013    multiple, L, d/t Afib, started eloquis  . Memory loss   . Asthma     UN[facility] OP[facility] DR PAZ  . Shortness of breath dyspnea   . TIA (transient ischemic attack)   . Diabetes mellitus     type 2   . GERD (gastroesophageal reflux disease)     Past Surgical History  Procedure Laterality Date  . Abdominal hysterectomy    . Total knee arthroplasty  1999  . Oophorectomy    . Cagb  1998  . Cesarean section      x 2  . Hemorrhoid surgery    . Colonoscopy with propofol N/A 03/30/2014    Procedure: COLONOSCOPY WITH PROPOFOL;  Surgeon: Irene Shipper, MD;  Location: [facility];  Service: Endoscopy;  Laterality: N/A;  . Aortic valve replacement  1998  . Coronary artery bypass graft  1998    SVG TO RCA  . Transthoracic echocardiogram  09/11/11    LVEF >55%.STAGE 1 DIASTOLIC DYSFUNCTION,ELEVATED LV FILLING PRESSURE.BIOPROSTHETIC AORTIC VALVE-PEAK AND MEAN GRADIENTS OF 17 MMHG AND 8 MMHG.SIGMOID SEPTUM.MILD TO MOD MR.MILD TO MOD TR.RVSP 46 MMHG.  . Myocardial perfusion study  12/19/09    NORMAL PATTERN OF PERFUSION IN ALL REGIONS.EF 75%.  . Carotid doppler  07/13/12  BILATERAL BULB/PROXIMAL ICAS;MILD AMOUNT OF FIBROUS PLAQUE WITH NO DIAMETER REDUCTION.    Social History  Substance Use Topics  . Smoking status: Former Research scientist (life sciences)  . Smokeless tobacco: Never Used     Comment: quit 2004, smoked 1.5 ppd  . Alcohol Use: No    Family History  Problem Relation Age of Onset  . Colon cancer Neg Hx   . Breast cancer Neg Hx   . Thyroid disease Neg Hx   . Coronary artery disease Son 57    deceased  . Heart attack Father   . Hypertension Brother   . Stroke Brother     Allergies  Allergen Reactions  . Hydrocodone Itching and Nausea Only  . Tramadol Hcl Itching and Nausea Only    Medication list has been reviewed and updated.  Current Outpatient Prescriptions on File Prior to Visit  Medication Sig Dispense Refill  .  acetaminophen (TYLENOL) 500 MG tablet Take 1,000 mg by mouth every 6 (six) hours as needed for moderate pain (pain).    Marland Kitchen albuterol (PROVENTIL HFA;VENTOLIN HFA) 108 (90 BASE) MCG/ACT inhaler Inhale 2 puffs into the lungs every 6 (six) hours as needed for wheezing or shortness of breath (wheezing). 1 Inhaler 1  . ALPRAZolam (XANAX) 0.5 MG tablet Take 0.5 mg by mouth at bedtime as needed for anxiety or sleep (insomnia).     Marland Kitchen aspirin 81 MG tablet Take 81 mg by mouth daily.    Marland Kitchen atorvastatin (LIPITOR) 40 MG tablet Take 1 tablet (40 mg total) by mouth at bedtime. 30 tablet 3  . beclomethasone (QVAR) 80 MCG/ACT inhaler Inhale 2 puffs into the lungs 2 (two) times daily.    . benazepril (LOTENSIN) 20 MG tablet Take 1 tablet (20 mg total) by mouth daily. NEED OV. 30 tablet 0  . Calcium Carbonate (CALCIUM 500 PO) Take 500 mg by mouth at bedtime.     . cholecalciferol (VITAMIN D) 1000 UNITS tablet Take 1,000 Units by mouth at bedtime.     Marland Kitchen diltiazem (CARDIZEM CD) 120 MG 24 hr capsule Take 120 mg by mouth daily.  2  . dorzolamide-timolol (COSOPT) 22.3-6.8 MG/ML ophthalmic solution Place 1 drop into both eyes 2 (two) times daily.     Marland Kitchen esomeprazole (NEXIUM) 40 MG capsule Take 1 capsule (40 mg total) by mouth daily before breakfast. 30 capsule 3  . furosemide (LASIX) 20 MG tablet Take 2 tablets (40 mg total) by mouth daily. 60 tablet 5  . magnesium oxide (MAG-OX) 400 MG tablet Take 1 tablet (400 mg total) by mouth daily. 30 tablet 6  . methimazole (TAPAZOLE) 5 MG tablet TAKE 1 TABLET BY MOUTH EVERY MONDAY, WEDNESDAY AND FRIDAY. APPOINTMENT NEEDED FOR FURTHER REFILLS 40 tablet 0  . metoprolol tartrate (LOPRESSOR) 25 MG tablet TAKE 1 TABLET BY MOUTH 3 TIMES A DAY 90 tablet 5  . nitroGLYCERIN (NITROSTAT) 0.4 MG SL tablet Place 0.4 mg under the tongue every 5 (five) minutes as needed for chest pain (chest pain).     . potassium chloride (K-DUR) 10 MEQ tablet Take 1 tablet (10 mEq total) by mouth daily. 30 tablet 6   . sitaGLIPtin (JANUVIA) 50 MG tablet Take 1 tablet (50 mg total) by mouth daily. 30 tablet 3  . TRAVATAN Z 0.004 % SOLN ophthalmic solution Place 1 drop into both eyes at bedtime.     Marland Kitchen glucose blood test strip 1 each by Other route as needed for other (blood sugar test). Use as instructed    .  ONETOUCH DELICA LANCETS 65B MISC Test blood sugars no more than twice daily. (Patient not taking: Reported on 03/11/2015) 100 each 12   No current facility-administered medications on file prior to visit.    Review of Systems:  As per HPI- otherwise negative.   Physical Examination: Filed Vitals:   03/11/15 1352  BP: 128/80  Pulse: 75  Temp: 98 F (36.7 C)  Resp: 16   Filed Vitals:   03/11/15 1352  Height: 5\' 2"  (1.575 m)  Weight: 163 lb (73.936 kg)   Body mass index is 29.81 kg/(m^2). Ideal Body Weight: Weight in (lb) to have BMI = 25: 136.4   GEN: WDWN, NAD, Non-toxic, Alert & Oriented x 3.  Elderly lady in no distress, looks well, here today with her daughter  HEENT: Atraumatic, Normocephalic.  Ears and Nose: No external deformity. EXTR: No clubbing/cyanosis/edema NEURO: Normal gait for age.  PSYCH: Normally interactive. Conversant. Not depressed or anxious appearing.  Calm demeanor.  Right thumb: there is a shallow v- shaped laceration on the pad of the right thumb.  Normal strength, ROM and sensation of the thumb, no evidence of deeper tissue injury.    Assessment and Plan: Thumb pain, right  Thumb laceration, right, initial encounter  Laceration to thumb repaired as per note by Philis Fendt, PA-C.   Wound care instructions given Follow-up as needed   Signed Lamar Blinks, MD

## 2015-03-11 NOTE — Progress Notes (Signed)
Procedure: Risk and benefits discussed and verbal consent obtained. The patient was anesthetized using 4 cc of 1:1 mix of 1% lidocaine with epi and Marcaine.  The wound was scrubbed with soap and water. Sterile prep and drape. The wound was closed with 4-0 Prolene.  The wound was then cleaned with water and a bandage was applied. Patient instructed to come back for suture removal in 10 days.

## 2015-03-11 NOTE — Patient Instructions (Signed)

## 2015-03-22 ENCOUNTER — Ambulatory Visit: Payer: Medicare Other

## 2015-03-22 ENCOUNTER — Ambulatory Visit (INDEPENDENT_AMBULATORY_CARE_PROVIDER_SITE_OTHER): Payer: Medicare Other

## 2015-03-22 VITALS — BP 134/80 | HR 72 | Ht 62.0 in | Wt 162.8 lb

## 2015-03-22 DIAGNOSIS — Z23 Encounter for immunization: Secondary | ICD-10-CM | POA: Diagnosis not present

## 2015-03-22 DIAGNOSIS — Z0000 Encounter for general adult medical examination without abnormal findings: Secondary | ICD-10-CM

## 2015-03-22 NOTE — Progress Notes (Addendum)
Subjective:   Rebecca Skinner is a 81 y.o. female who presents for Medicare Annual (Subsequent) preventive examination.  Review of Systems: No ROS  Cardiac Risk Factors include: advanced age (>23men, >79 women);diabetes mellitus;sedentary lifestyle;hypertension  Sleep patterns:  Sleep approximately 4-5 hours/gets up once/ home well lit Home Safety/Smoke Alarms:  Feels safe at home. Lives at home with grandson.  Has life alert.  Smoke alarms present. No alarm system, but has flood lights outside.   Firearm Safety: Discussed firearm safety.   Seat Belt Safety/Bike Helmet:  Always wears seat belt.     Counseling:   Eye Exam-4 weeks ago---Dr. Delman Cheadle.  Dental- Goes yearly  Female:  Pap-Hysterectomy     Mammo-No longer receive.     Dexa scan-02/06/12       CCS- 03/30/14  Immunizations UTD.  Flu shot given today during visit.     Objective:     Vitals: BP 134/80 mmHg  Pulse 72  Ht 5\' 2"  (1.575 m)  Wt 162 lb 12.8 oz (73.846 kg)  BMI 29.77 kg/m2  SpO2 99%  Tobacco History  Smoking status  . Former Smoker  Smokeless tobacco  . Never Used    Comment: quit 2004, smoked 1.5 ppd     Counseling given: Not Answered   Past Medical History  Diagnosis Date  . Hypertension   . Hyperlipidemia   . CAD (coronary artery disease)     s/p CABG s/p AO valve replacement ([place] [facility])  . Recurrent UTI   . Paroxysmal atrial fibrillation ([place])     cards d/c coumadin 12/2008 d/t persisten NSR and frequent falls- restarted coumadin july 2011, now on Eliquis  . Hyperthyroidism   . Dry skin   . Osteoarthritis   . Osteopenia     per dexa 12/09  . Keloid     @ chest  . Dizziness     Chronic, admiet 07-2010,saw neuro, thought to be a peripheral issue   . Anxiety 11/20/2011  . CVA (cerebral infarction) 08-2013    multiple, L, d/t Afib, started eloquis  . Memory loss   . Asthma     UNDER THE CARE OPF DR PAZ  . Shortness of breath dyspnea   . TIA (transient ischemic attack)   . Diabetes  mellitus     type 2   . GERD (gastroesophageal reflux disease)    Past Surgical History  Procedure Laterality Date  . Abdominal hysterectomy    . Total knee arthroplasty  1999  . Oophorectomy    . Cagb  1998  . Cesarean section      x 2  . Hemorrhoid surgery    . Colonoscopy with propofol N/A 03/30/2014    Procedure: COLONOSCOPY WITH PROPOFOL;  Surgeon: Irene Shipper, MD;  Location: [facility];  Service: Endoscopy;  Laterality: N/A;  . Aortic valve replacement  1998  . Coronary artery bypass graft  1998    SVG TO RCA  . Transthoracic echocardiogram  09/11/11    LVEF >55%.STAGE 1 DIASTOLIC DYSFUNCTION,ELEVATED LV FILLING PRESSURE.BIOPROSTHETIC AORTIC VALVE-PEAK AND MEAN GRADIENTS OF 17 MMHG AND 8 MMHG.SIGMOID SEPTUM.MILD TO MOD MR.MILD TO MOD TR.RVSP 46 MMHG.  . Myocardial perfusion study  12/19/09    NORMAL PATTERN OF PERFUSION IN ALL REGIONS.EF 75%.  . Carotid doppler  07/13/12    BILATERAL BULB/PROXIMAL ICAS;MILD AMOUNT OF FIBROUS PLAQUE WITH NO DIAMETER REDUCTION.   Family History  Problem Relation Age of Onset  . Colon cancer Neg Hx   .  Breast cancer Neg Hx   . Thyroid disease Neg Hx   . Coronary artery disease Son 80    deceased  . Heart attack Father   . Hypertension Brother   . Stroke Brother    History  Sexual Activity  . Sexual Activity: Not on file    Outpatient Encounter Prescriptions as of 03/22/2015  Medication Sig  . acetaminophen (TYLENOL) 500 MG tablet Take 1,000 mg by mouth every 6 (six) hours as needed for moderate pain (pain).  Marland Kitchen albuterol (PROVENTIL HFA;VENTOLIN HFA) 108 (90 BASE) MCG/ACT inhaler Inhale 2 puffs into the lungs every 6 (six) hours as needed for wheezing or shortness of breath (wheezing).  . ALPRAZolam (XANAX) 0.5 MG tablet Take 0.5 mg by mouth at bedtime as needed for anxiety or sleep (insomnia).   Marland Kitchen aspirin 81 MG tablet Take 81 mg by mouth daily.  Marland Kitchen atorvastatin (LIPITOR) 40 MG tablet Take 1 tablet (40 mg total) by mouth at bedtime.  .  beclomethasone (QVAR) 80 MCG/ACT inhaler Inhale 2 puffs into the lungs 2 (two) times daily.  . benazepril (LOTENSIN) 20 MG tablet Take 1 tablet (20 mg total) by mouth daily. NEED OV.  . cholecalciferol (VITAMIN D) 1000 UNITS tablet Take 1,000 Units by mouth at bedtime.   Marland Kitchen diltiazem (CARDIZEM CD) 120 MG 24 hr capsule Take 120 mg by mouth daily.  . dorzolamide-timolol (COSOPT) 22.3-6.8 MG/ML ophthalmic solution Place 1 drop into both eyes 2 (two) times daily.   Marland Kitchen esomeprazole (NEXIUM) 40 MG capsule Take 1 capsule (40 mg total) by mouth daily before breakfast.  . furosemide (LASIX) 20 MG tablet Take 2 tablets (40 mg total) by mouth daily.  Marland Kitchen glucose blood test strip 1 each by Other route as needed for other (blood sugar test). Use as instructed  . magnesium oxide (MAG-OX) 400 MG tablet Take 1 tablet (400 mg total) by mouth daily.  . methimazole (TAPAZOLE) 5 MG tablet TAKE 1 TABLET BY MOUTH EVERY MONDAY, WEDNESDAY AND FRIDAY. APPOINTMENT NEEDED FOR FURTHER REFILLS  . metoprolol tartrate (LOPRESSOR) 25 MG tablet TAKE 1 TABLET BY MOUTH 3 TIMES A DAY  . nitroGLYCERIN (NITROSTAT) 0.4 MG SL tablet Place 0.4 mg under the tongue every 5 (five) minutes as needed for chest pain (chest pain).   Glory Rosebush DELICA LANCETS 70Y MISC Test blood sugars no more than twice daily.  . potassium chloride (K-DUR) 10 MEQ tablet Take 1 tablet (10 mEq total) by mouth daily.  . sitaGLIPtin (JANUVIA) 50 MG tablet Take 1 tablet (50 mg total) by mouth daily.  . TRAVATAN Z 0.004 % SOLN ophthalmic solution Place 1 drop into both eyes at bedtime.   . Calcium Carbonate (CALCIUM 500 PO) Take 500 mg by mouth at bedtime.    No facility-administered encounter medications on file as of 03/22/2015.    Activities of Daily Living In your present state of health, do you have any difficulty performing the following activities: 03/22/2015 12/20/2014  Hearing? N Y  Vision? N N  Difficulty concentrating or making decisions? N Y  Walking or  climbing stairs? Y N  Dressing or bathing? N N  Doing errands, shopping? Tempie Donning  Preparing Food and eating ? N -  Using the Toilet? N -  In the past six months, have you accidently leaked urine? N -  Do you have problems with loss of bowel control? N -  Managing your Medications? N -  Managing your Finances? Y -  Housekeeping or managing your Housekeeping?  Y -    Patient Care Team: Colon Branch, MD as PCP - General (Internal Medicine) Troy Sine, MD as Consulting Physician (Cardiology) Renato Shin, MD as Consulting Physician (Endocrinology) Melissa Noon, OD as Referring Physician (Optometry)    Assessment:   Atrial Fibrillation- Per Dr. Sanda Klein is controlled and not a candidate for anticoagulation. Taking ASA 81 mg and diltiazem daily.  Followed by Cards.  DM Type II- Last A1C-12/21/14-6.4.  Taking Januvia as prescribed.  Followed by Dr. Larose Kells and Dr. Loanne Drilling.  Chronic diastolic HF- No change in weight.  Denies edema and shortness of breath.  Taking Lasix.  Encouraged low sodium diet and to monitor weight daily.  Followed by Dr. Larose Kells.    Exercise Activities and Dietary recommendations Current Exercise Habits:: The patient does not participate in regular exercise at present   Diet- Pt eats very little.  1-2 small meals per day.  Mostly eats out.  Drinks lots of water.  Eats fresh fruit/cooked vegetables.  Enjoys [employer] hamburger and onion rings.   Goals    . Get flu shot       Fall Risk Fall Risk  03/22/2015 09/12/2014 02/16/2013 08/20/2011  Falls in the past year? Yes No Yes -  Number falls in past yr: 2 or more - 1 -  Injury with Fall? No - No -  Risk for fall due to : Impaired vision;Impaired balance/gait;Impaired mobility - History of fall(s) Impaired balance/gait  Risk for fall due to (comments): Pt walks with a walker. Sometimes a cane per daughter.  - pt states 14 total  -  Follow up Education provided;Falls prevention discussed - - -   Depression Screen PHQ 2/9 Scores  03/22/2015 03/11/2015 09/12/2014 02/16/2013  PHQ - 2 Score 1 0 0 0     Cognitive Testing MMSE - Mini Mental State Exam 03/22/2015  Orientation to time 2  Orientation to Place 4  Registration 3  Attention/ Calculation 5  Recall 1  Language- name 2 objects 2  Language- repeat 1  Language- follow 3 step command 3  Language- read & follow direction 1  Write a sentence 1  Copy design 1  Total score 24    Immunization History  Administered Date(s) Administered  . Influenza Whole 04/14/2007, 04/27/2008, 03/06/2009, 03/10/2010  . Influenza,inj,Quad PF,36+ Mos 03/03/2013, 03/22/2014, 03/22/2015  . Pneumococcal Polysaccharide-23 04/29/2008  . Tdap 02/16/2013  . Zoster 11/26/2011   Screening Tests Health Maintenance  Topic Date Due  . FOOT EXAM  05/11/2015 (Originally 01/11/1933)  . PNA vac Low Risk Adult (2 of 2 - PCV13) 05/11/2015 (Originally 04/29/2009)  . HEMOGLOBIN A1C  06/23/2015  . OPHTHALMOLOGY EXAM  11/29/2015  . INFLUENZA VACCINE  01/09/2016  . TETANUS/TDAP  02/17/2023  . DEXA SCAN  Completed  . ZOSTAVAX  Completed      Plan:  Follow up with Dr. Larose Kells next week as scheduled.   During the course of the visit the patient was educated and counseled about the following appropriate screening and preventive services:   Vaccines to include Pneumoccal, Influenza, Hepatitis B, Td, Zostavax, HCV  Electrocardiogram  Cardiovascular Disease  Colorectal cancer screening  Bone density screening  Diabetes screening  Glaucoma screening  Mammography/PAP  Nutrition counseling   Patient Instructions (the written plan) was given to the patient.   Rudene Anda, RN  03/22/2015  Reviewed  French Ana MD

## 2015-03-22 NOTE — Patient Instructions (Addendum)
Follow up with Dr. Larose Kells as scheduled.   Fall Prevention in the Home  Falls can cause injuries. They can happen to people of all ages. There are many things you can do to make your home safe and to help prevent falls.  WHAT CAN I DO ON THE OUTSIDE OF MY HOME?  Regularly fix the edges of walkways and driveways and fix any cracks.  Remove anything that might make you trip as you walk through a door, such as a raised step or threshold.  Trim any bushes or trees on the path to your home.  Use bright outdoor lighting.  Clear any walking paths of anything that might make someone trip, such as rocks or tools.  Regularly check to see if handrails are loose or broken. Make sure that both sides of any steps have handrails.  Any raised decks and porches should have guardrails on the edges.  Have any leaves, snow, or ice cleared regularly.  Use sand or salt on walking paths during winter.  Clean up any spills in your garage right away. This includes oil or grease spills. WHAT CAN I DO IN THE BATHROOM?   Use night lights.  Install grab bars by the toilet and in the tub and shower. Do not use towel bars as grab bars.  Use non-skid mats or decals in the tub or shower.  If you need to sit down in the shower, use a plastic, non-slip stool.  Keep the floor dry. Clean up any water that spills on the floor as soon as it happens.  Remove soap buildup in the tub or shower regularly.  Attach bath mats securely with double-sided non-slip rug tape.  Do not have throw rugs and other things on the floor that can make you trip. WHAT CAN I DO IN THE BEDROOM?  Use night lights.  Make sure that you have a light by your bed that is easy to reach.  Do not use any sheets or blankets that are too big for your bed. They should not hang down onto the floor.  Have a firm chair that has side arms. You can use this for support while you get dressed.  Do not have throw rugs and other things on the floor  that can make you trip. WHAT CAN I DO IN THE KITCHEN?  Clean up any spills right away.  Avoid walking on wet floors.  Keep items that you use a lot in easy-to-reach places.  If you need to reach something above you, use a strong step stool that has a grab bar.  Keep electrical cords out of the way.  Do not use floor polish or wax that makes floors slippery. If you must use wax, use non-skid floor wax.  Do not have throw rugs and other things on the floor that can make you trip. WHAT CAN I DO WITH MY STAIRS?  Do not leave any items on the stairs.  Make sure that there are handrails on both sides of the stairs and use them. Fix handrails that are broken or loose. Make sure that handrails are as long as the stairways.  Check any carpeting to make sure that it is firmly attached to the stairs. Fix any carpet that is loose or worn.  Avoid having throw rugs at the top or bottom of the stairs. If you do have throw rugs, attach them to the floor with carpet tape.  Make sure that you have a light switch at the top  of the stairs and the bottom of the stairs. If you do not have them, ask someone to add them for you. WHAT ELSE CAN I DO TO HELP PREVENT FALLS?  Wear shoes that:  Do not have high heels.  Have rubber bottoms.  Are comfortable and fit you well.  Are closed at the toe. Do not wear sandals.  If you use a stepladder:  Make sure that it is fully opened. Do not climb a closed stepladder.  Make sure that both sides of the stepladder are locked into place.  Ask someone to hold it for you, if possible.  Clearly mark and make sure that you can see:  Any grab bars or handrails.  First and last steps.  Where the edge of each step is.  Use tools that help you move around (mobility aids) if they are needed. These include:  Canes.  Walkers.  Scooters.  Crutches.  Turn on the lights when you go into a dark area. Replace any light bulbs as soon as they burn  out.  Set up your furniture so you have a clear path. Avoid moving your furniture around.  If any of your floors are uneven, fix them.  If there are any pets around you, be aware of where they are.  Review your medicines with your doctor. Some medicines can make you feel dizzy. This can increase your chance of falling. Ask your doctor what other things that you can do to help prevent falls.   This information is not intended to replace advice given to you by your health care provider. Make sure you discuss any questions you have with your health care provider.   Document Released: 03/23/2009 Document Revised: 10/11/2014 Document Reviewed: 07/01/2014 Elsevier Interactive Patient Education 2016 [place] Maintenance, Female Adopting a healthy lifestyle and getting preventive care can go a long way to promote health and wellness. Talk with your health care provider about what schedule of regular examinations is right for you. This is a good chance for you to check in with your provider about disease prevention and staying healthy. In between checkups, there are plenty of things you can do on your own. Experts have done a lot of research about which lifestyle changes and preventive measures are most likely to keep you healthy. Ask your health care provider for more information. WEIGHT AND DIET  Eat a healthy diet  Be sure to include plenty of vegetables, fruits, low-fat dairy products, and lean protein.  Do not eat a lot of foods high in solid fats, added sugars, or salt.  Get regular exercise. This is one of the most important things you can do for your health.  Most adults should exercise for at least 150 minutes each week. The exercise should increase your heart rate and make you sweat (moderate-intensity exercise).  Most adults should also do strengthening exercises at least twice a week. This is in addition to the moderate-intensity exercise.  Maintain a healthy  weight  Body mass index (BMI) is a measurement that can be used to identify possible weight problems. It estimates body fat based on height and weight. Your health care provider can help determine your BMI and help you achieve or maintain a healthy weight.  For females 50 years of age and older:   A BMI below 18.5 is considered underweight.  A BMI of 18.5 to 24.9 is normal.  A BMI of 25 to 29.9 is considered overweight.  A BMI of 30  and above is considered obese.  Watch levels of cholesterol and blood lipids  You should start having your blood tested for lipids and cholesterol at 79 years of age, then have this test every 5 years.  You may need to have your cholesterol levels checked more often if:  Your lipid or cholesterol levels are high.  You are older than 79 years of age.  You are at high risk for heart disease.  CANCER SCREENING   Lung Cancer  Lung cancer screening is recommended for adults 53-46 years old who are at high risk for lung cancer because of a history of smoking.  A yearly low-dose CT scan of the lungs is recommended for people who:  Currently smoke.  Have quit within the past 15 years.  Have at least a 30-pack-year history of smoking. A pack year is smoking an average of one pack of cigarettes a day for 1 year.  Yearly screening should continue until it has been 15 years since you quit.  Yearly screening should stop if you develop a health problem that would prevent you from having lung cancer treatment.  Breast Cancer  Practice breast self-awareness. This means understanding how your breasts normally appear and feel.  It also means doing regular breast self-exams. Let your health care provider know about any changes, no matter how small.  If you are in your 20s or 30s, you should have a clinical breast exam (CBE) by a health care provider every 1-3 years as part of a regular health exam.  If you are 61 or older, have a CBE every year. Also  consider having a breast X-ray (mammogram) every year.  If you have a family history of breast cancer, talk to your health care provider about genetic screening.  If you are at high risk for breast cancer, talk to your health care provider about having an MRI and a mammogram every year.  Breast cancer gene (BRCA) assessment is recommended for women who have family members with BRCA-related cancers. BRCA-related cancers include:  Breast.  Ovarian.  Tubal.  Peritoneal cancers.  Results of the assessment will determine the need for genetic counseling and BRCA1 and BRCA2 testing. Cervical Cancer Your health care provider may recommend that you be screened regularly for cancer of the pelvic organs (ovaries, uterus, and vagina). This screening involves a pelvic examination, including checking for microscopic changes to the surface of your cervix (Pap test). You may be encouraged to have this screening done every 3 years, beginning at age 66.  For women ages 42-65, health care providers may recommend pelvic exams and Pap testing every 3 years, or they may recommend the Pap and pelvic exam, combined with testing for human papilloma virus (HPV), every 5 years. Some types of HPV increase your risk of cervical cancer. Testing for HPV may also be done on women of any age with unclear Pap test results.  Other health care providers may not recommend any screening for nonpregnant women who are considered low risk for pelvic cancer and who do not have symptoms. Ask your health care provider if a screening pelvic exam is right for you.  If you have had past treatment for cervical cancer or a condition that could lead to cancer, you need Pap tests and screening for cancer for at least 20 years after your treatment. If Pap tests have been discontinued, your risk factors (such as having a new sexual partner) need to be reassessed to determine if screening should resume. Some  women have medical problems that  increase the chance of getting cervical cancer. In these cases, your health care provider may recommend more frequent screening and Pap tests. Colorectal Cancer  This type of cancer can be detected and often prevented.  Routine colorectal cancer screening usually begins at 79 years of age and continues through 79 years of age.  Your health care provider may recommend screening at an earlier age if you have risk factors for colon cancer.  Your health care provider may also recommend using home test kits to check for hidden blood in the stool.  A small camera at the end of a tube can be used to examine your colon directly (sigmoidoscopy or colonoscopy). This is done to check for the earliest forms of colorectal cancer.  Routine screening usually begins at age 73.  Direct examination of the colon should be repeated every 5-10 years through 79 years of age. However, you may need to be screened more often if early forms of precancerous polyps or small growths are found. Skin Cancer  Check your skin from head to toe regularly.  Tell your health care provider about any new moles or changes in moles, especially if there is a change in a mole's shape or color.  Also tell your health care provider if you have a mole that is larger than the size of a pencil eraser.  Always use sunscreen. Apply sunscreen liberally and repeatedly throughout the day.  Protect yourself by wearing long sleeves, pants, a wide-brimmed hat, and sunglasses whenever you are outside. HEART DISEASE, DIABETES, AND HIGH BLOOD PRESSURE   High blood pressure causes heart disease and increases the risk of stroke. High blood pressure is more likely to develop in:  People who have blood pressure in the high end of the normal range (130-139/85-89 mm Hg).  People who are overweight or obese.  People who are African American.  If you are 45-60 years of age, have your blood pressure checked every 3-5 years. If you are 63 years of  age or older, have your blood pressure checked every year. You should have your blood pressure measured twice--once when you are at a hospital or clinic, and once when you are not at a hospital or clinic. Record the average of the two measurements. To check your blood pressure when you are not at a hospital or clinic, you can use:  An automated blood pressure machine at a pharmacy.  A home blood pressure monitor.  If you are between 42 years and 29 years old, ask your health care provider if you should take aspirin to prevent strokes.  Have regular diabetes screenings. This involves taking a blood sample to check your fasting blood sugar level.  If you are at a normal weight and have a low risk for diabetes, have this test once every three years after 79 years of age.  If you are overweight and have a high risk for diabetes, consider being tested at a younger age or more often. PREVENTING INFECTION  Hepatitis B  If you have a higher risk for hepatitis B, you should be screened for this virus. You are considered at high risk for hepatitis B if:  You were born in a country where hepatitis B is common. Ask your health care provider which countries are considered high risk.  Your parents were born in a high-risk country, and you have not been immunized against hepatitis B (hepatitis B vaccine).  You have HIV or AIDS.  You  use needles to inject street drugs.  You live with someone who has hepatitis B.  You have had sex with someone who has hepatitis B.  You get hemodialysis treatment.  You take certain medicines for conditions, including cancer, organ transplantation, and autoimmune conditions. Hepatitis C  Blood testing is recommended for:  Everyone born from [birth year] through 1963.  Anyone with known risk factors for hepatitis C. Sexually transmitted infections (STIs)  You should be screened for sexually transmitted infections (STIs) including gonorrhea and chlamydia if:  You are  sexually active and are younger than 79 years of age.  You are older than 79 years of age and your health care provider tells you that you are at risk for this type of infection.  Your sexual activity has changed since you were last screened and you are at an increased risk for chlamydia or gonorrhea. Ask your health care provider if you are at risk.  If you do not have HIV, but are at risk, it may be recommended that you take a prescription medicine daily to prevent HIV infection. This is called pre-exposure prophylaxis (PrEP). You are considered at risk if:  You are sexually active and do not regularly use condoms or know the HIV status of your partner(s).  You take drugs by injection.  You are sexually active with a partner who has HIV. Talk with your health care provider about whether you are at high risk of being infected with HIV. If you choose to begin PrEP, you should first be tested for HIV. You should then be tested every 3 months for as long as you are taking PrEP.  PREGNANCY   If you are premenopausal and you may become pregnant, ask your health care provider about preconception counseling.  If you may become pregnant, take 400 to 800 micrograms (mcg) of folic acid every day.  If you want to prevent pregnancy, talk to your health care provider about birth control (contraception). OSTEOPOROSIS AND MENOPAUSE   Osteoporosis is a disease in which the bones lose minerals and strength with aging. This can result in serious bone fractures. Your risk for osteoporosis can be identified using a bone density scan.  If you are 36 years of age or older, or if you are at risk for osteoporosis and fractures, ask your health care provider if you should be screened.  Ask your health care provider whether you should take a calcium or vitamin D supplement to lower your risk for osteoporosis.  Menopause may have certain physical symptoms and risks.  Hormone replacement therapy may reduce some  of these symptoms and risks. Talk to your health care provider about whether hormone replacement therapy is right for you.  HOME CARE INSTRUCTIONS   Schedule regular health, dental, and eye exams.  Stay current with your immunizations.   Do not use any tobacco products including cigarettes, chewing tobacco, or electronic cigarettes.  If you are pregnant, do not drink alcohol.  If you are breastfeeding, limit how much and how often you drink alcohol.  Limit alcohol intake to no more than 1 drink per day for nonpregnant women. One drink equals 12 ounces of beer, 5 ounces of wine, or 1 ounces of hard liquor.  Do not use street drugs.  Do not share needles.  Ask your health care provider for help if you need support or information about quitting drugs.  Tell your health care provider if you often feel depressed.  Tell your health care provider if you have  ever been abused or do not feel safe at home.   This information is not intended to replace advice given to you by your health care provider. Make sure you discuss any questions you have with your health care provider.   Document Released: 12/10/2010 Document Revised: 06/17/2014 Document Reviewed: 04/28/2013 Elsevier Interactive Patient Education [employer].

## 2015-03-22 NOTE — Progress Notes (Signed)
Pre visit review using our clinic review tool, if applicable. No additional management support is needed unless otherwise documented below in the visit note. 

## 2015-03-24 ENCOUNTER — Ambulatory Visit (INDEPENDENT_AMBULATORY_CARE_PROVIDER_SITE_OTHER): Payer: Medicare Other | Admitting: Physician Assistant

## 2015-03-24 VITALS — HR 68 | Temp 98.0°F | Resp 18

## 2015-03-24 DIAGNOSIS — Z4802 Encounter for removal of sutures: Secondary | ICD-10-CM

## 2015-03-24 DIAGNOSIS — S61011A Laceration without foreign body of right thumb without damage to nail, initial encounter: Secondary | ICD-10-CM

## 2015-03-24 NOTE — Progress Notes (Signed)
Rebecca Skinner  MRN: [id number] DOB: 20-Jul-1922  Subjective:  Pt presents to clinic for suture removal.  She is still having quite a bit of pain where the laceration as but it is getting better.  Patient Active Problem List   Diagnosis Date Noted  . Chronic diastolic heart failure ([place]) 12/29/2014  . Edema 12/20/2014  . DM type 2 (diabetes mellitus, type 2) ([place]) 12/20/2014  . Dyspnea 12/20/2014  . Chronic atrial fibrillation ([place]) 05/12/2014  . Angiectasia 03/30/2014  . GI bleed 03/28/2014  . Long term current use of anticoagulant therapy 03/28/2014  . Memory loss 09/16/2013  . Annual physical exam 11/21/2011  . ATRIAL FIBRILLATION, PAROXYSMAL 03/05/2007  . Diabetes mellitus with circulatory complication ([place]) 62/[id number]  . S/P AVR (aortic valve replacement) 10/15/2006  . Thyrotoxicosis 09/03/2006  . Dyslipidemia 09/03/2006  . Essential hypertension 09/03/2006  . CAD (coronary artery disease) of artery bypass graft 09/03/2006  . Intrinsic asthma 09/03/2006    Current Outpatient Prescriptions on File Prior to Visit  Medication Sig Dispense Refill  . acetaminophen (TYLENOL) 500 MG tablet Take 1,000 mg by mouth every 6 (six) hours as needed for moderate pain (pain).    Marland Kitchen albuterol (PROVENTIL HFA;VENTOLIN HFA) 108 (90 BASE) MCG/ACT inhaler Inhale 2 puffs into the lungs every 6 (six) hours as needed for wheezing or shortness of breath (wheezing). 1 Inhaler 1  . ALPRAZolam (XANAX) 0.5 MG tablet Take 0.5 mg by mouth at bedtime as needed for anxiety or sleep (insomnia).     Marland Kitchen aspirin 81 MG tablet Take 81 mg by mouth daily.    Marland Kitchen atorvastatin (LIPITOR) 40 MG tablet Take 1 tablet (40 mg total) by mouth at bedtime. 30 tablet 3  . beclomethasone (QVAR) 80 MCG/ACT inhaler Inhale 2 puffs into the lungs 2 (two) times daily.    . benazepril (LOTENSIN) 20 MG tablet Take 1 tablet (20 mg total) by mouth daily. NEED OV. 30 tablet 0  . Calcium Carbonate (CALCIUM 500 PO) Take 500 mg by mouth at  bedtime.     . cholecalciferol (VITAMIN D) 1000 UNITS tablet Take 1,000 Units by mouth at bedtime.     Marland Kitchen diltiazem (CARDIZEM CD) 120 MG 24 hr capsule Take 120 mg by mouth daily.  2  . dorzolamide-timolol (COSOPT) 22.3-6.8 MG/ML ophthalmic solution Place 1 drop into both eyes 2 (two) times daily.     Marland Kitchen esomeprazole (NEXIUM) 40 MG capsule Take 1 capsule (40 mg total) by mouth daily before breakfast. 30 capsule 3  . furosemide (LASIX) 20 MG tablet Take 2 tablets (40 mg total) by mouth daily. 60 tablet 5  . glucose blood test strip 1 each by Other route as needed for other (blood sugar test). Use as instructed    . magnesium oxide (MAG-OX) 400 MG tablet Take 1 tablet (400 mg total) by mouth daily. 30 tablet 6  . methimazole (TAPAZOLE) 5 MG tablet TAKE 1 TABLET BY MOUTH EVERY MONDAY, WEDNESDAY AND FRIDAY. APPOINTMENT NEEDED FOR FURTHER REFILLS 40 tablet 0  . metoprolol tartrate (LOPRESSOR) 25 MG tablet TAKE 1 TABLET BY MOUTH 3 TIMES A DAY 90 tablet 5  . nitroGLYCERIN (NITROSTAT) 0.4 MG SL tablet Place 0.4 mg under the tongue every 5 (five) minutes as needed for chest pain (chest pain).     Glory Rosebush DELICA LANCETS 62V MISC Test blood sugars no more than twice daily. 100 each 12  . potassium chloride (K-DUR) 10 MEQ tablet Take 1 tablet (10 mEq total) by mouth  daily. 30 tablet 6  . sitaGLIPtin (JANUVIA) 50 MG tablet Take 1 tablet (50 mg total) by mouth daily. 30 tablet 3  . TRAVATAN Z 0.004 % SOLN ophthalmic solution Place 1 drop into both eyes at bedtime.      No current facility-administered medications on file prior to visit.    Allergies  Allergen Reactions  . Hydrocodone Itching and Nausea Only  . Tramadol Hcl Itching and Nausea Only    Review of Systems  Skin: Positive for wound.   Objective:  Pulse 68  Temp(Src) 98 F (36.7 C) (Oral)  Resp 18  SpO2 97%  Physical Exam  Constitutional: She is oriented to person, place, and time and well-developed, well-nourished, and in no  distress.  HENT:  Head: Normocephalic and atraumatic.  Right Ear: Hearing and external ear normal.  Left Ear: Hearing and external ear normal.  Eyes: Conjunctivae are normal.  Neck: Normal range of motion.  Pulmonary/Chest: Effort normal.  Neurological: She is alert and oriented to person, place, and time. Gait normal.  Skin: Skin is warm and dry.  Well healed wound on her right thumb - 6 SI sutures removed.  Psychiatric: Mood, memory, affect and judgment normal.  Vitals reviewed.   Assessment and Plan :  Thumb laceration, right, initial encounter   D/w pt how to take care of the wound since the sutures have been removed.  Windell Hummingbird PA-C  [facility] 03/24/2015 2:00 PM

## 2015-03-26 ENCOUNTER — Other Ambulatory Visit: Payer: Self-pay | Admitting: Internal Medicine

## 2015-04-05 ENCOUNTER — Encounter: Payer: Self-pay | Admitting: Internal Medicine

## 2015-04-05 ENCOUNTER — Ambulatory Visit (INDEPENDENT_AMBULATORY_CARE_PROVIDER_SITE_OTHER): Payer: Medicare Other | Admitting: Internal Medicine

## 2015-04-05 VITALS — BP 118/74 | HR 52 | Temp 98.0°F | Ht 62.0 in | Wt 162.5 lb

## 2015-04-05 DIAGNOSIS — M25511 Pain in right shoulder: Secondary | ICD-10-CM

## 2015-04-05 DIAGNOSIS — I10 Essential (primary) hypertension: Secondary | ICD-10-CM

## 2015-04-05 DIAGNOSIS — Z23 Encounter for immunization: Secondary | ICD-10-CM | POA: Diagnosis not present

## 2015-04-05 DIAGNOSIS — I5030 Unspecified diastolic (congestive) heart failure: Secondary | ICD-10-CM

## 2015-04-05 DIAGNOSIS — Z09 Encounter for follow-up examination after completed treatment for conditions other than malignant neoplasm: Secondary | ICD-10-CM | POA: Insufficient documentation

## 2015-04-05 NOTE — Progress Notes (Signed)
Subjective:    Patient ID: Rebecca Skinner, female    DOB: [birth year], 81 y.o.   MRN: [id number]  DOS:  04/05/2015 Type of visit - description : Routine check up, here with one of her daughters Interval history: In general feeling well, good compliance of medication, no ambulatory blood sugars or BP readings. Reports good compliance with low salt diet. Complaining of chronic right shoulder pain, is getting worse, pain is anterior, states "something needs to be done"   Review of Systems No chest pain, occasional difficulty breathing at baseline. Appetite okay. No nausea, vomiting, diarrhea. Her daughter reports that she is doing very good.  Past Medical History  Diagnosis Date  . Hypertension   . Hyperlipidemia   . CAD (coronary artery disease)     s/p CABG s/p AO valve replacement ([place] [facility])  . Recurrent UTI   . Paroxysmal atrial fibrillation ([place])     cards d/c coumadin 12/2008 d/t persisten NSR and frequent falls- restarted coumadin july 2011, now on Eliquis  . Hyperthyroidism   . Dry skin   . Osteoarthritis   . Osteopenia     per dexa 12/09  . Keloid     @ chest  . Dizziness     Chronic, admiet 07-2010,saw neuro, thought to be a peripheral issue   . Anxiety 11/20/2011  . CVA (cerebral infarction) 08-2013    multiple, L, d/t Afib, started eloquis  . Memory loss   . Asthma     [facility] DR Neko Mcgeehan  . Shortness of breath dyspnea   . TIA (transient ischemic attack)   . Diabetes mellitus     type 2   . GERD (gastroesophageal reflux disease)     Past Surgical History  Procedure Laterality Date  . Abdominal hysterectomy    . Total knee arthroplasty  1999  . Oophorectomy    . Cagb  1998  . Cesarean section      x 2  . Hemorrhoid surgery    . Colonoscopy with propofol N/A 03/30/2014    Procedure: COLONOSCOPY WITH PROPOFOL;  Surgeon: Irene Shipper, MD;  Location: [facility];  Service: Endoscopy;  Laterality: N/A;  . Aortic valve replacement  1998  .  Coronary artery bypass graft  1998    SVG TO RCA  . Transthoracic echocardiogram  09/11/11    LVEF >55%.STAGE 1 DIASTOLIC DYSFUNCTION,ELEVATED LV FILLING PRESSURE.BIOPROSTHETIC AORTIC VALVE-PEAK AND MEAN GRADIENTS OF 17 MMHG AND 8 MMHG.SIGMOID SEPTUM.MILD TO MOD MR.MILD TO MOD TR.RVSP 46 MMHG.  . Myocardial perfusion study  12/19/09    NORMAL PATTERN OF PERFUSION IN ALL REGIONS.EF 75%.  . Carotid doppler  07/13/12    BILATERAL BULB/PROXIMAL ICAS;MILD AMOUNT OF FIBROUS PLAQUE WITH NO DIAMETER REDUCTION.    Social History   Social History  . Marital Status: Divorced    Spouse Name: N/A  . Number of Children: 4  . Years of Education: N/A   Occupational History  . retired     Social History Main Topics  . Smoking status: Former Research scientist (life sciences)  . Smokeless tobacco: Never Used     Comment: quit 2004, smoked 1.5 ppd  . Alcohol Use: No  . Drug Use: No  . Sexual Activity: Not on file   Other Topics Concern  . Not on file   Social History Narrative   Back living at her house since the last visit, [place] lives w/ her (82 y/o)   Had 3 daughter- 2 son  (lost  oldest daughter and son), 2 living daughters in [place]        Medication List       This list is accurate as of: 04/05/15  6:01 PM.  Always use your most recent med list.               acetaminophen 500 MG tablet  Commonly known as:  TYLENOL  Take 1,000 mg by mouth every 6 (six) hours as needed for moderate pain (pain).     albuterol 108 (90 BASE) MCG/ACT inhaler  Commonly known as:  PROVENTIL HFA;VENTOLIN HFA  Inhale 2 puffs into the lungs every 6 (six) hours as needed for wheezing or shortness of breath (wheezing).     ALPRAZolam 0.5 MG tablet  Commonly known as:  XANAX  Take 0.5 mg by mouth at bedtime as needed for anxiety or sleep (insomnia).     aspirin 81 MG tablet  Take 81 mg by mouth daily.     atorvastatin 40 MG tablet  Commonly known as:  LIPITOR  Take 1 tablet (40 mg total) by mouth at bedtime.      benazepril 20 MG tablet  Commonly known as:  LOTENSIN  Take 1 tablet (20 mg total) by mouth daily. NEED OV.     CALCIUM 500 PO  Take 500 mg by mouth at bedtime.     cholecalciferol 1000 UNITS tablet  Commonly known as:  VITAMIN D  Take 1,000 Units by mouth at bedtime.     diltiazem 120 MG 24 hr capsule  Commonly known as:  CARDIZEM CD  Take 120 mg by mouth daily.     dorzolamide-timolol 22.3-6.8 MG/ML ophthalmic solution  Commonly known as:  COSOPT  Place 1 drop into both eyes 2 (two) times daily.     esomeprazole 40 MG capsule  Commonly known as:  NEXIUM  Take 1 capsule (40 mg total) by mouth daily before breakfast.     furosemide 20 MG tablet  Commonly known as:  LASIX  Take 2 tablets (40 mg total) by mouth daily.     glucose blood test strip  1 each by Other route as needed for other (blood sugar test). Use as instructed     magnesium oxide 400 MG tablet  Commonly known as:  MAG-OX  Take 1 tablet (400 mg total) by mouth daily.     methimazole 5 MG tablet  Commonly known as:  TAPAZOLE  TAKE 1 TABLET BY MOUTH EVERY MONDAY, WEDNESDAY AND FRIDAY. APPOINTMENT NEEDED FOR FURTHER REFILLS     metoprolol tartrate 25 MG tablet  Commonly known as:  LOPRESSOR  TAKE 1 TABLET BY MOUTH 3 TIMES A DAY     nitroGLYCERIN 0.4 MG SL tablet  Commonly known as:  NITROSTAT  Place 0.4 mg under the tongue every 5 (five) minutes as needed for chest pain (chest pain).     ONETOUCH DELICA LANCETS 35K Misc  Test blood sugars no more than twice daily.     potassium chloride 10 MEQ tablet  Commonly known as:  K-DUR  Take 1 tablet (10 mEq total) by mouth daily.     QVAR 80 MCG/ACT inhaler  Generic drug:  beclomethasone  Inhale 2 puffs into the lungs 2 (two) times daily.     sitaGLIPtin 50 MG tablet  Commonly known as:  JANUVIA  Take 1 tablet (50 mg total) by mouth daily.     TRAVATAN Z 0.004 % Soln ophthalmic solution  Generic drug:  Travoprost (BAK Free)  Place 1 drop into both eyes  at bedtime.           Objective:   Physical Exam BP 118/74 mmHg  Pulse 52  Temp(Src) 98 F (36.7 C) (Oral)  Ht 5\' 2"  (1.575 m)  Wt 162 lb 8 oz (73.71 kg)  BMI 29.71 kg/m2  SpO2 99% General:   Well developed, well nourished elderly lady in no distress  HEENT:  Normocephalic . Face symmetric, atraumatic Lungs:  CTA B Normal respiratory effort, no intercostal retractions, no accessory muscle use. Heart: Irregular, + systolic murmur No pretibial edema bilaterally  MSK: Shoulders without deformity, range of motion is normal except when she tries to reach back with her right arm. + TTP anteriorly at  right shoulder. Swelling? Thumb laceration: Seems to be healing well with no evidence of infection Skin: Not pale. Not jaundice Neurologic:  Speech normal, gait appropriate for age and assisted by a walker Psych--  Behavior appropriate. No anxious or depressed appearing.      Assessment & Plan:    assessment > DM HTN Hyperlipidemia Hyperthyroidism  Dr. Loanne Drilling GERD Asthma  Anxiety  On xanax  CV: Dr. Claiborne Billings --CHF --CAD, CABG --Aortic valve replacement --Paroxysmal atrial fibrillation, used to be on Coumadin, now Eliquis after the stroke 3-15  --TIAs, CVA 2015 DJD Osteopenia 2009 Memory deficits  Recurrent UTIs   Plan: CHF: Seems to be doing great, weight is stable at 162. last BMP satisfactory. Recommend no change. See instructions HTN: well-controlled Thumb laceration: Seems to be healing well Shoulder pain: Refer to ortho Primary care -- flu shot today RTC 4 months

## 2015-04-05 NOTE — Progress Notes (Signed)
Pre visit review using our clinic review tool, if applicable. No additional management support is needed unless otherwise documented below in the visit note. 

## 2015-04-05 NOTE — Assessment & Plan Note (Signed)
CHF: Seems to be doing great, weight is stable at 162. last BMP satisfactory. Recommend no change. See instructions HTN: well-controlled Thumb laceration: Seems to be healing well Shoulder pain: Refer to ortho Primary care -- flu shot today RTC 4 months

## 2015-04-05 NOTE — Patient Instructions (Addendum)
Continue taking the same medications  Continue avoiding excessive salt intake  Weight yourself daily, if you gain more than 5 pounds: Please call the office    Next visit  for a    routine checkup in 3 to 4 months  (30 minutes) Please schedule an appointment at the front desk

## 2015-04-07 ENCOUNTER — Other Ambulatory Visit: Payer: Self-pay | Admitting: Cardiovascular Disease

## 2015-04-12 DIAGNOSIS — H401131 Primary open-angle glaucoma, bilateral, mild stage: Secondary | ICD-10-CM | POA: Diagnosis not present

## 2015-04-19 DIAGNOSIS — M25511 Pain in right shoulder: Secondary | ICD-10-CM | POA: Diagnosis not present

## 2015-05-03 ENCOUNTER — Other Ambulatory Visit: Payer: Self-pay | Admitting: Internal Medicine

## 2015-05-08 ENCOUNTER — Telehealth: Payer: Self-pay | Admitting: Internal Medicine

## 2015-05-08 NOTE — Telephone Encounter (Signed)
Pt dropped off paperwork for renewal of parking placard, put in your tray at front office pt will pick up when ready

## 2015-05-09 NOTE — Telephone Encounter (Signed)
Filled out as much as possible and forwarded to Dr. Larose Kells. JG//CMA

## 2015-05-11 NOTE — Telephone Encounter (Signed)
Called and informed pt that form is ready for pick up. Copy sent for scanning. JG//CMA

## 2015-05-22 ENCOUNTER — Other Ambulatory Visit: Payer: Self-pay | Admitting: Cardiovascular Disease

## 2015-05-31 ENCOUNTER — Other Ambulatory Visit: Payer: Self-pay | Admitting: Cardiovascular Disease

## 2015-06-19 ENCOUNTER — Other Ambulatory Visit: Payer: Self-pay | Admitting: Cardiovascular Disease

## 2015-06-20 ENCOUNTER — Other Ambulatory Visit: Payer: Self-pay

## 2015-06-20 MED ORDER — DILTIAZEM HCL ER COATED BEADS 120 MG PO CP24: 120.0000 mg | ORAL_CAPSULE | Freq: Every day | ORAL | Status: DC

## 2015-06-24 ENCOUNTER — Other Ambulatory Visit: Payer: Self-pay | Admitting: Internal Medicine

## 2015-07-11 ENCOUNTER — Encounter: Payer: Self-pay | Admitting: Internal Medicine

## 2015-07-11 ENCOUNTER — Ambulatory Visit (INDEPENDENT_AMBULATORY_CARE_PROVIDER_SITE_OTHER): Payer: Medicare Other | Admitting: Internal Medicine

## 2015-07-11 ENCOUNTER — Telehealth: Payer: Self-pay | Admitting: Internal Medicine

## 2015-07-11 VITALS — BP 128/74 | HR 60 | Temp 98.2°F | Ht 62.0 in | Wt 167.4 lb

## 2015-07-11 DIAGNOSIS — R040 Epistaxis: Secondary | ICD-10-CM

## 2015-07-11 DIAGNOSIS — M1990 Unspecified osteoarthritis, unspecified site: Secondary | ICD-10-CM

## 2015-07-11 DIAGNOSIS — I10 Essential (primary) hypertension: Secondary | ICD-10-CM | POA: Diagnosis not present

## 2015-07-11 DIAGNOSIS — E785 Hyperlipidemia, unspecified: Secondary | ICD-10-CM

## 2015-07-11 DIAGNOSIS — E119 Type 2 diabetes mellitus without complications: Secondary | ICD-10-CM

## 2015-07-11 DIAGNOSIS — G4700 Insomnia, unspecified: Secondary | ICD-10-CM

## 2015-07-11 LAB — LIPID PANEL
CHOLESTEROL: 156 mg/dL (ref 0–200)
HDL: 49.9 mg/dL (ref >39.00)
LDL Cholesterol: 92 mg/dL (ref 0–99)
NonHDL: 106.11
Total CHOL/HDL Ratio: 3
Triglycerides: 72 mg/dL (ref 0.0–149.0)
VLDL: 14.4 mg/dL (ref 0.0–40.0)

## 2015-07-11 LAB — BASIC METABOLIC PANEL
BUN: 19 mg/dL (ref 6–23)
CHLORIDE: 103 meq/L (ref 96–112)
CO2: 31 mEq/L (ref 19–32)
Calcium: 9.6 mg/dL (ref 8.4–10.5)
Creatinine, Ser: 0.99 mg/dL (ref 0.40–1.20)
GFR: 67.69 mL/min (ref >60.00)
Glucose, Bld: 102 mg/dL — ABNORMAL HIGH (ref 70–99)
POTASSIUM: 4.1 meq/L (ref 3.5–5.1)
SODIUM: 142 meq/L (ref 135–145)

## 2015-07-11 LAB — HEMOGLOBIN A1C: Hgb A1c MFr Bld: 6.3 % (ref 4.6–6.5)

## 2015-07-11 MED ORDER — ALPRAZOLAM 0.5 MG PO TABS: 0.5000 mg | ORAL_TABLET | Freq: Every evening | ORAL | Status: DC | PRN

## 2015-07-11 MED ORDER — KETOPROFEN 10 % CREA: TOPICAL_CREAM | Status: DC

## 2015-07-11 NOTE — Progress Notes (Signed)
Subjective:    Patient ID: Rebecca Skinner, female    DOB: 23-Oct-1922, 82 y.o.   MRN: [id number]  DOS:  07/11/2015 Type of visit - description : routine visit, here with her daughter Interval history: DM: good compliance with medication, no ambulatory blood sugars HTN: Good compliance with medications, not ambulatory BPs Complaining of nosebleed, right side, on and off, not severe. Unable to sleep well, for the last few weeks, waking up at 3 AM, no major problems with anxiety or depression. [id number] a prescription for ketoprofen cream.    Review of Systems denies chest pain or difficulty breathing. No nausea, vomiting, diarrhea. No blood in the stools. No dysuria or gross hematuria.  Past Medical History  Diagnosis Date  . Hypertension   . Hyperlipidemia   . CAD (coronary artery disease)     s/p CABG s/p AO valve replacement ([place] [facility])  . Recurrent UTI   . Paroxysmal atrial fibrillation ([place])     cards d/c coumadin 12/2008 d/t persisten NSR and frequent falls- restarted coumadin july 2011, now on Eliquis  . Hyperthyroidism   . Dry skin   . Osteoarthritis   . Osteopenia     per dexa 12/09  . Keloid     @ chest  . Dizziness     Chronic, admiet 07-2010,saw neuro, thought to be a peripheral issue   . Anxiety 11/20/2011  . CVA (cerebral infarction) 08-2013    multiple, L, d/t Afib, started eloquis  . Memory loss   . Asthma     [facility] DR Harol Shabazz  . Shortness of breath dyspnea   . TIA (transient ischemic attack)   . Diabetes mellitus     type 2   . GERD (gastroesophageal reflux disease)     Past Surgical History  Procedure Laterality Date  . Abdominal hysterectomy    . Total knee arthroplasty  1999  . Oophorectomy    . Cagb  1998  . Cesarean section      x 2  . Hemorrhoid surgery    . Colonoscopy with propofol N/A 03/30/2014    Procedure: COLONOSCOPY WITH PROPOFOL;  Surgeon: Irene Shipper, MD;  Location: [facility];  Service: Endoscopy;  Laterality:  N/A;  . Aortic valve replacement  1998  . Coronary artery bypass graft  1998    SVG TO RCA  . Transthoracic echocardiogram  09/11/11    LVEF >55%.STAGE 1 DIASTOLIC DYSFUNCTION,ELEVATED LV FILLING PRESSURE.BIOPROSTHETIC AORTIC VALVE-PEAK AND MEAN GRADIENTS OF 17 MMHG AND 8 MMHG.SIGMOID SEPTUM.MILD TO MOD MR.MILD TO MOD TR.RVSP 46 MMHG.  . Myocardial perfusion study  12/19/09    NORMAL PATTERN OF PERFUSION IN ALL REGIONS.EF 75%.  . Carotid doppler  07/13/12    BILATERAL BULB/PROXIMAL ICAS;MILD AMOUNT OF FIBROUS PLAQUE WITH NO DIAMETER REDUCTION.    Social History   Social History  . Marital Status: Divorced    Spouse Name: N/A  . Number of Children: 4  . Years of Education: N/A   Occupational History  . retired     Social History Main Topics  . Smoking status: Former Research scientist (life sciences)  . Smokeless tobacco: Never Used     Comment: quit 2004, smoked 1.5 ppd  . Alcohol Use: No  . Drug Use: No  . Sexual Activity: Not on file   Other Topics Concern  . Not on file   Social History Narrative   Back living at her house since the last visit, [place] lives w/ her (82 y/o)  Had 3 daughter- 2 son  (lost oldest daughter and son), 2 living daughters in [place]        Medication List       This list is accurate as of: 07/11/15 11:59 PM.  Always use your most recent med list.               acetaminophen 500 MG tablet  Commonly known as:  TYLENOL  Take 1,000 mg by mouth every 6 (six) hours as needed for moderate pain (pain).     albuterol 108 (90 Base) MCG/ACT inhaler  Commonly known as:  PROAIR HFA  Inhale 2 puffs into the lungs every 6 (six) hours as needed for wheezing or shortness of breath.     ALPRAZolam 0.5 MG tablet  Commonly known as:  XANAX  Take 1 tablet (0.5 mg total) by mouth at bedtime as needed for anxiety or sleep (insomnia).     aspirin 81 MG tablet  Take 81 mg by mouth daily.     atorvastatin 40 MG tablet  Commonly known as:  LIPITOR  Take 1 tablet (40 mg total) by  mouth at bedtime.     benazepril 20 MG tablet  Commonly known as:  LOTENSIN  Take 1 tablet (20 mg total) by mouth daily. NEED OV.     CALCIUM 500 PO  Take 500 mg by mouth at bedtime.     cholecalciferol 1000 units tablet  Commonly known as:  VITAMIN D  Take 1,000 Units by mouth at bedtime.     diltiazem 120 MG 24 hr capsule  Commonly known as:  CARDIZEM CD  Take 1 capsule (120 mg total) by mouth daily.     dorzolamide-timolol 22.3-6.8 MG/ML ophthalmic solution  Commonly known as:  COSOPT  Place 1 drop into both eyes 2 (two) times daily.     esomeprazole 40 MG capsule  Commonly known as:  NEXIUM  Take 1 capsule (40 mg total) by mouth daily before breakfast.     furosemide 20 MG tablet  Commonly known as:  LASIX  Take 2 tablets (40 mg total) by mouth daily.     glucose blood test strip  1 each by Other route as needed for other (blood sugar test). Reported on 07/11/2015     Ketoprofen 10 % Crea  Apply twice a day to the knee as needed     magnesium oxide 400 MG tablet  Commonly known as:  MAG-OX  Take 1 tablet (400 mg total) by mouth daily.     methimazole 5 MG tablet  Commonly known as:  TAPAZOLE  TAKE 1 TABLET BY MOUTH EVERY MONDAY, WEDNESDAY AND FRIDAY. APPOINTMENT NEEDED FOR FURTHER REFILLS     metoprolol tartrate 25 MG tablet  Commonly known as:  LOPRESSOR  TAKE 1 TABLET BY MOUTH 3 TIMES A DAY     nitroGLYCERIN 0.4 MG SL tablet  Commonly known as:  NITROSTAT  Place 0.4 mg under the tongue every 5 (five) minutes as needed for chest pain (chest pain). Reported on [id number]     [facility] DELICA LANCETS [id number] Misc  Test blood sugars no more than twice daily.     potassium chloride 10 MEQ tablet  Commonly known as:  K-DUR  Take 1 tablet (10 mEq total) by mouth daily.     QVAR 80 MCG/ACT inhaler  Generic drug:  beclomethasone  Inhale 2 puffs into the lungs 2 (two) times daily.     sitaGLIPtin 50 MG tablet  Commonly known  as:  JANUVIA  Take 1 tablet (50 mg  total) by mouth daily.     TRAVATAN Z 0.004 % Soln ophthalmic solution  Generic drug:  Travoprost (BAK Free)  Place 1 drop into both eyes at bedtime.           Objective:   Physical Exam BP 128/74 mmHg  Pulse 60  Temp(Src) 98.2 F (36.8 C) (Oral)  Ht 5\' 2"  (1.575 m)  Wt 167 lb 6 oz (75.921 kg)  BMI 30.61 kg/m2  SpO2 96% General:   Well developed, well nourished . NAD.  HEENT:  Normocephalic . Face symmetric, atraumatic Nose: Normal to inspection, no active bleedingLungs:  CTA B Normal respiratory effort, no intercostal retractions, no accessory muscle use. Heart: regular?, soft syst  murmur.  No pretibial edema bilaterally  Skin: Not pale. Not jaundice Neurologic:  alert & oriented X3.  Speech normal, gait appropriate for age and unassisted Psych--  Cognition and judgment appear intact.  Cooperative with normal attention span and concentration.  Behavior appropriate. No anxious or depressed appearing.      Assessment & Plan:  assessment DM HTN Hyperlipidemia Hyperthyroidism  Dr. Loanne Drilling GERD Asthma  Anxiety  On xanax  CV: Dr. Claiborne Billings --CHF --CAD, CABG --Aortic valve replacement --Paroxysmal atrial fibrillation, used to be on Coumadin,   Eliquis rx after the stroke 3-15, d/c 03-2014 d/t GI bleed  --TIAs, CVA 2015 DJD Osteopenia 2009 Memory deficits  Recurrent UTIs  PLAN DM: Check A1c, continue Januvia HTN: Well-controlled, continue Lotensin, Cardizem, Lasix, potassium Hyperlipidemia:LFTs few months ago wnl, check a FLP Insomnia, has not tried xanax, rx printed  Watch for oversedation. Paroxysmal atrial fibrillation: Rate controlled, on aspirin only. DJD: ok ketoprofen topical, rx sent; had shoulder pain, s/p local injection per ortho, was rec to RTC prn Nosebleed: Recommend observation for now RTC 4 months

## 2015-07-11 NOTE — Telephone Encounter (Signed)
Okay to switch?  

## 2015-07-11 NOTE — Progress Notes (Signed)
Pre visit review using our clinic review tool, if applicable. No additional management support is needed unless otherwise documented below in the visit note. 

## 2015-07-11 NOTE — Telephone Encounter (Signed)
That is  less potent but okay to switch if the patient agrees

## 2015-07-11 NOTE — Telephone Encounter (Signed)
Spoke with [facility], Ketoprofen is "compounded" medication which they do have or can not order. Informed [facility] did not let Pt or her daughter know. Informed that I would call Pt and/or her family tomorrow to see if they would be interested in sending medication to [facility] which fills compounds medications.

## 2015-07-11 NOTE — Telephone Encounter (Signed)
[facility] #[id number] [place], [place] (Phone) [phone] (Fax)         Reason for call:  Pharmacy states Ketoprofen 10 % CREA doesn't come in 10% but comes in 2%

## 2015-07-11 NOTE — Patient Instructions (Signed)
BEFORE YOU LEAVE THE OFFICE:  GO TO THE LAB : Get the blood work    GO TO THE FRONT DESK Schedule a routine office visit or check up to be done in  4 months  No  fasting    Front desk:   30   AFTER YOU LEAVE THE OFFICE:  Take Xanax at bedtime, as needed for difficulty sleeping  . Watch for excessive somnolence.

## 2015-07-12 NOTE — Telephone Encounter (Signed)
LMOM informing Pt's daughter, Ayira to return call regarding medication.

## 2015-07-18 DIAGNOSIS — H25041 Posterior subcapsular polar age-related cataract, right eye: Secondary | ICD-10-CM | POA: Diagnosis not present

## 2015-07-18 DIAGNOSIS — H401131 Primary open-angle glaucoma, bilateral, mild stage: Secondary | ICD-10-CM | POA: Diagnosis not present

## 2015-07-24 ENCOUNTER — Ambulatory Visit (INDEPENDENT_AMBULATORY_CARE_PROVIDER_SITE_OTHER): Payer: Medicare Other | Admitting: Endocrinology

## 2015-07-24 ENCOUNTER — Other Ambulatory Visit: Payer: Self-pay | Admitting: Internal Medicine

## 2015-07-24 ENCOUNTER — Encounter: Payer: Self-pay | Admitting: Endocrinology

## 2015-07-24 VITALS — BP 134/88 | HR 81 | Temp 98.1°F | Ht 62.0 in | Wt 173.0 lb

## 2015-07-24 DIAGNOSIS — E0520 Thyrotoxicosis with toxic multinodular goiter without thyrotoxic crisis or storm: Secondary | ICD-10-CM | POA: Diagnosis not present

## 2015-07-24 LAB — TSH: TSH: 1.46 u[IU]/mL (ref 0.35–4.50)

## 2015-07-24 NOTE — Patient Instructions (Addendum)
blood tests are requested for you today.  We'll let you know about the results. if ever you have fever while taking methimazole, stop it and call us, because of the risk of a rare side-effect.   Please come back for a follow-up appointment in 6 months.

## 2015-07-24 NOTE — Progress Notes (Signed)
Subjective:    Patient ID: Rebecca Skinner, female    DOB: [birth year], 82 y.o.   MRN: [id number]  HPI pt returns for f/u of hyperthyroidism (due to multinodular goiter; dx'ed 2008; tapazole was chosen as initial rx, due to AF; due to increasing TSH, this was reduced, then stopped altogether in 2011; also in 2011, US showed small multinodular goiter; in early 2012, hyperthyroidism recurred; the plan was for scanning, then i-131 rx, but a repeat TSH was normal. the TSH did not go low again until mid-2014; she chose to resume tapazole; in October of 2014, tapazole was reduced, due to normalization of her TFT). she takes tapazole as rx'ed. She denies tremor.  pt states she feels well in general..  Past Medical History  Diagnosis Date  . Hypertension   . Hyperlipidemia   . CAD (coronary artery disease)     s/p CABG s/p AO valve replacement ([place] [facility])  . Recurrent UTI   . Paroxysmal atrial fibrillation ([place])     cards d/c coumadin 12/2008 d/t persisten NSR and frequent falls- restarted coumadin july 2011, now on Eliquis  . Hyperthyroidism   . Dry skin   . Osteoarthritis   . Osteopenia     per dexa 12/09  . Keloid     @ chest  . Dizziness     Chronic, admiet 07-2010,saw neuro, thought to be a peripheral issue   . Anxiety 11/20/2011  . CVA (cerebral infarction) 08-2013    multiple, L, d/t Afib, started eloquis  . Memory loss   . Asthma     UNDER [facility] DR PAZ  . Shortness of breath dyspnea   . TIA (transient ischemic attack)   . Diabetes mellitus     type 2   . GERD (gastroesophageal reflux disease)     Past Surgical History  Procedure Laterality Date  . Abdominal hysterectomy    . Total knee arthroplasty  1999  . Oophorectomy    . Cagb  1998  . Cesarean section      x 2  . Hemorrhoid surgery    . Colonoscopy with propofol N/A 03/30/2014    Procedure: COLONOSCOPY WITH PROPOFOL;  Surgeon: Irene Shipper, MD;  Location: [facility];  Service: Endoscopy;  Laterality: N/A;    . Aortic valve replacement  1998  . Coronary artery bypass graft  1998    SVG TO RCA  . Transthoracic echocardiogram  09/11/11    LVEF >55%.STAGE 1 DIASTOLIC DYSFUNCTION,ELEVATED LV FILLING PRESSURE.BIOPROSTHETIC AORTIC VALVE-PEAK AND MEAN GRADIENTS OF 17 MMHG AND 8 MMHG.SIGMOID SEPTUM.MILD TO MOD MR.MILD TO MOD TR.RVSP 46 MMHG.  . Myocardial perfusion study  12/19/09    NORMAL PATTERN OF PERFUSION IN ALL REGIONS.EF 75%.  . Carotid doppler  07/13/12    BILATERAL BULB/PROXIMAL ICAS;MILD AMOUNT OF FIBROUS PLAQUE WITH NO DIAMETER REDUCTION.    Social History   Social History  . Marital Status: Divorced    Spouse Name: N/A  . Number of Children: 4  . Years of Education: N/A   Occupational History  . retired     Social History Main Topics  . Smoking status: Former Research scientist (life sciences)  . Smokeless tobacco: Never Used     Comment: quit 2004, smoked 1.5 ppd  . Alcohol Use: No  . Drug Use: No  . Sexual Activity: Not on file   Other Topics Concern  . Not on file   Social History Narrative   Back living at her house since the  last visit, GGson lives w/ her (80 y/o)   Had 3 daughter- 2 son  (lost oldest daughter and son), 2 living daughters in [place]    Current Outpatient Prescriptions on File Prior to Visit  Medication Sig Dispense Refill  . acetaminophen (TYLENOL) 500 MG tablet Take 1,000 mg by mouth every 6 (six) hours as needed for moderate pain (pain).    Marland Kitchen albuterol (PROAIR HFA) 108 (90 Base) MCG/ACT inhaler Inhale 2 puffs into the lungs every 6 (six) hours as needed for wheezing or shortness of breath. 18 g 6  . ALPRAZolam (XANAX) 0.5 MG tablet Take 1 tablet (0.5 mg total) by mouth at bedtime as needed for anxiety or sleep (insomnia). 30 tablet 1  . aspirin 81 MG tablet Take 81 mg by mouth daily.    Marland Kitchen atorvastatin (LIPITOR) 40 MG tablet Take 1 tablet (40 mg total) by mouth at bedtime. 30 tablet 3  . beclomethasone (QVAR) 80 MCG/ACT inhaler Inhale 2 puffs into the lungs 2 (two) times  daily.    . benazepril (LOTENSIN) 20 MG tablet Take 1 tablet (20 mg total) by mouth daily. NEED OV. 30 tablet 0  . Calcium Carbonate (CALCIUM 500 PO) Take 500 mg by mouth at bedtime.     . cholecalciferol (VITAMIN D) 1000 UNITS tablet Take 1,000 Units by mouth at bedtime.     Marland Kitchen diltiazem (CARDIZEM CD) 120 MG 24 hr capsule Take 1 capsule (120 mg total) by mouth daily. 30 capsule 0  . dorzolamide-timolol (COSOPT) 22.3-6.8 MG/ML ophthalmic solution Place 1 drop into both eyes 2 (two) times daily.     Marland Kitchen esomeprazole (NEXIUM) 40 MG capsule Take 1 capsule (40 mg total) by mouth daily before breakfast. 30 capsule 6  . furosemide (LASIX) 20 MG tablet Take 2 tablets (40 mg total) by mouth daily. 60 tablet 5  . glucose blood test strip 1 each by Other route as needed for other (blood sugar test). Reported on 07/11/2015    . Ketoprofen 10 % CREA Apply twice a day to the knee as needed 120 g 2  . metoprolol tartrate (LOPRESSOR) 25 MG tablet TAKE 1 TABLET BY MOUTH 3 TIMES A DAY 90 tablet 5  . nitroGLYCERIN (NITROSTAT) 0.4 MG SL tablet Place 0.4 mg under the tongue every 5 (five) minutes as needed for chest pain (chest pain). Reported on 07/11/2015    . ONETOUCH DELICA LANCETS 99991111 MISC Test blood sugars no more than twice daily. 100 each 12  . potassium chloride (K-DUR) 10 MEQ tablet Take 1 tablet (10 mEq total) by mouth daily. 30 tablet 6  . sitaGLIPtin (JANUVIA) 50 MG tablet Take 1 tablet (50 mg total) by mouth daily. 30 tablet 4  . TRAVATAN Z 0.004 % SOLN ophthalmic solution Place 1 drop into both eyes at bedtime.      No current facility-administered medications on file prior to visit.    Allergies  Allergen Reactions  . Hydrocodone Itching and Nausea Only  . Tramadol Hcl Itching and Nausea Only    Family History  Problem Relation Age of Onset  . Colon cancer Neg Hx   . Breast cancer Neg Hx   . Thyroid disease Neg Hx   . Coronary artery disease Son 36    deceased  . Heart attack Father   .  Hypertension Brother   . Stroke Brother     BP 134/88 mmHg  Pulse 81  Temp(Src) 98.1 F (36.7 C) (Oral)  Ht 5\' 2"  (1.575 m)  Wt 173 lb (78.472 kg)  BMI 31.63 kg/m2  SpO2 93%  Review of Systems Denies fever    Objective:   Physical Exam VITAL SIGNS:  See vs page GENERAL: no distress NECK: small multinodular goiter.  NEURO: no tremor Skin: not diaphoretic   Lab Results  Component Value Date   TSH 1.46 07/24/2015       Assessment & Plan:  Hyperthyroidism: well-controlled  Patient is advised the following: Patient Instructions  blood tests are requested for you today.  We'll let you know about the results. if ever you have fever while taking methimazole, stop it and call us, because of the risk of a rare side-effect.   Please come back for a follow-up appointment in 6 months.      Addendum: Please continue the same medication

## 2015-07-25 ENCOUNTER — Other Ambulatory Visit: Payer: Self-pay | Admitting: Internal Medicine

## 2015-07-26 ENCOUNTER — Telehealth: Payer: Self-pay | Admitting: Internal Medicine

## 2015-07-26 ENCOUNTER — Other Ambulatory Visit: Payer: Self-pay | Admitting: Cardiovascular Disease

## 2015-07-26 ENCOUNTER — Ambulatory Visit (INDEPENDENT_AMBULATORY_CARE_PROVIDER_SITE_OTHER): Payer: Medicare Other | Admitting: Medical

## 2015-07-26 ENCOUNTER — Encounter: Payer: Self-pay | Admitting: Medical

## 2015-07-26 ENCOUNTER — Telehealth: Payer: Self-pay | Admitting: *Deleted

## 2015-07-26 ENCOUNTER — Ambulatory Visit (HOSPITAL_BASED_OUTPATIENT_CLINIC_OR_DEPARTMENT_OTHER)
Admission: RE | Admit: 2015-07-26 | Discharge: 2015-07-26 | Disposition: A | Discharge status: Home / Self Care | Payer: Medicare Other | Source: Ambulatory Visit | Attending: Medical | Admitting: Medical

## 2015-07-26 VITALS — BP 122/80 | HR 78 | Temp 97.4°F | Resp 20 | Ht 62.0 in | Wt 176.6 lb

## 2015-07-26 DIAGNOSIS — R0600 Dyspnea, unspecified: Secondary | ICD-10-CM

## 2015-07-26 DIAGNOSIS — I509 Heart failure, unspecified: Secondary | ICD-10-CM | POA: Insufficient documentation

## 2015-07-26 DIAGNOSIS — R05 Cough: Secondary | ICD-10-CM | POA: Diagnosis not present

## 2015-07-26 DIAGNOSIS — R062 Wheezing: Secondary | ICD-10-CM | POA: Insufficient documentation

## 2015-07-26 DIAGNOSIS — I517 Cardiomegaly: Secondary | ICD-10-CM | POA: Insufficient documentation

## 2015-07-26 DIAGNOSIS — R059 Cough, unspecified: Secondary | ICD-10-CM

## 2015-07-26 LAB — CBC WITH DIFFERENTIAL/PLATELET
BASOS PCT: 0.3 % (ref 0.0–3.0)
Basophils Absolute: 0 10*3/uL (ref 0.0–0.1)
EOS ABS: 0.2 10*3/uL (ref 0.0–0.7)
Eosinophils Relative: 2.5 % (ref 0.0–5.0)
HCT: 39.1 % (ref 36.0–46.0)
HEMOGLOBIN: 12.8 g/dL (ref 12.0–15.0)
LYMPHS ABS: 1.5 10*3/uL (ref 0.7–4.0)
Lymphocytes Relative: 21.9 % (ref 12.0–46.0)
MCHC: 32.6 g/dL (ref 30.0–36.0)
MCV: 100 fl (ref 78.0–100.0)
MONO ABS: 0.9 10*3/uL (ref 0.1–1.0)
Monocytes Relative: 12.5 % — ABNORMAL HIGH (ref 3.0–12.0)
NEUTROS ABS: 4.3 10*3/uL (ref 1.4–7.7)
NEUTROS PCT: 62.8 % (ref 43.0–77.0)
PLATELETS: 189 10*3/uL (ref 150.0–400.0)
RBC: 3.91 Mil/uL (ref 3.87–5.11)
RDW: 14.4 % (ref 11.5–15.5)
WBC: 6.8 10*3/uL (ref 4.0–10.5)

## 2015-07-26 LAB — COMPREHENSIVE METABOLIC PANEL
ALT: 17 U/L (ref 0–35)
AST: 21 U/L (ref 0–37)
Albumin: 4 g/dL (ref 3.5–5.2)
Alkaline Phosphatase: 91 U/L (ref 39–117)
BUN: 16 mg/dL (ref 6–23)
CO2: 29 meq/L (ref 19–32)
CREATININE: 0.97 mg/dL (ref 0.40–1.20)
Calcium: 9.5 mg/dL (ref 8.4–10.5)
Chloride: 106 mEq/L (ref 96–112)
GFR: 69.3 mL/min (ref >60.00)
Glucose, Bld: 101 mg/dL — ABNORMAL HIGH (ref 70–99)
Potassium: 4.5 mEq/L (ref 3.5–5.1)
SODIUM: 141 meq/L (ref 135–145)
Total Bilirubin: 0.4 mg/dL (ref 0.2–1.2)
Total Protein: 7.2 g/dL (ref 6.0–8.3)

## 2015-07-26 LAB — BRAIN NATRIURETIC PEPTIDE: PRO B NATRI PEPTIDE: 512 pg/mL — AB (ref 0.0–100.0)

## 2015-07-26 MED ORDER — AZITHROMYCIN 250 MG PO TABS: ORAL_TABLET | ORAL | Status: DC

## 2015-07-26 MED ORDER — BECLOMETHASONE DIPROPIONATE 80 MCG/ACT IN AERS: 2.0000 | INHALATION_SPRAY | Freq: Two times a day (BID) | RESPIRATORY_TRACT | Status: DC

## 2015-07-26 MED ORDER — FLUTICASONE PROPIONATE HFA 110 MCG/ACT IN AERO: 2.0000 | INHALATION_SPRAY | Freq: Two times a day (BID) | RESPIRATORY_TRACT | Status: DC

## 2015-07-26 NOTE — Progress Notes (Signed)
Pre visit review using our clinic review tool, if applicable. No additional management support is needed unless otherwise documented below in the visit note. 

## 2015-07-26 NOTE — Telephone Encounter (Signed)
Caller name: Rebecca Skinner Relationship to patient: Daughter  Can be reached: [phone]   Reason for call: Patients daughter Rebecca Skinner) called stating that patient is having shortness of breath, wheezing and feeling fatigued. Telephone was disconnected before she could be transferred to [facility]. Attempted to call daughter back but could not get an answer. Searched patients chart for additional contact info and located another daughter. Call her to see if she could tell me what was going on with the patient. Daughter Rebecca Skinner) was not in the presence of the patient but stated that patient was short of breath, had swelling and just did not look right. Stated that she was in route to patients home where the other daughter Rebecca Skinner) was currently at. Asked Rebecca Skinner to call back to the office as soon as she was in the presence of the patient so that we could get her to [facility] to speak with a nurse. Daughter stated that she understood and would call back within 10 minutes when she arrives at patients home.

## 2015-07-26 NOTE — Telephone Encounter (Signed)
Received fax from [facility] stating that QVAR is not a formulary medication on patient's insurance; the preferred alternative is Flovent HFA or Diskus/SLS 02/15 Please advise on change in therapy and/or start prior authorization [pt has no Hx of trying Flovent].

## 2015-07-26 NOTE — Progress Notes (Signed)
Subjective:    Patient ID: Rebecca Skinner, female    DOB: 30-Apr-1923, 82 y.o.   MRN: [id number]  HPI  Pt in with some coughing and wheezing for about one week.  Some nasal congestion and runny nose.  No fever, no chills or sweats. Some productive cough.   No flu like body aches.  Some nasal congestion and runny nose.   Pt ha history of albuterol use but on review sounds like not chronic use.   Pt daughter states she has gained some weight.   Pt ran out of her lasix two week ago.  Daughter thinks since off lasix that mom has gained weight. By flow sheet review 10 lbs in last 2 weeks.   Review of Systems  Constitutional: Negative for fever, chills and fatigue.  Respiratory: Positive for cough, shortness of breath and wheezing.   Cardiovascular: Negative for chest pain and palpitations.  Gastrointestinal: Negative for abdominal pain.  Musculoskeletal: Negative for back pain.  Skin: Negative for rash.  Neurological: Negative for dizziness and headaches.  Hematological: Negative for adenopathy. Does not bruise/bleed easily.  Psychiatric/Behavioral: Negative for behavioral problems and confusion.   Past Medical History  Diagnosis Date  . Hypertension   . Hyperlipidemia   . CAD (coronary artery disease)     s/p CABG s/p AO valve replacement ([place] [facility])  . Recurrent UTI   . Paroxysmal atrial fibrillation ([place])     cards d/c coumadin 12/2008 d/t persisten NSR and frequent falls- restarted coumadin july 2011, now on Eliquis  . Hyperthyroidism   . Dry skin   . Osteoarthritis   . Osteopenia     per dexa 12/09  . Keloid     @ chest  . Dizziness     Chronic, admiet 07-2010,saw neuro, thought to be a peripheral issue   . Anxiety 11/20/2011  . CVA (cerebral infarction) 08-2013    multiple, L, d/t Afib, started eloquis  . Memory loss   . Asthma     UNDER [facility]ARE OP[facility] DR PAZ  . Shortness of breath dyspnea   . TIA (transient ischemic attack)   . Diabetes mellitus     type  2   . GERD (gastroesophageal reflux disease)     Social History   Social History  . Marital Status: Divorced    Spouse Name: N/A  . Number of Children: 4  . Years of Education: N/A   Occupational History  . retired     Social History Main Topics  . Smoking status: Former Research scientist (life sciences)  . Smokeless tobacco: Never Used     Comment: quit 2004, smoked 1.5 ppd  . Alcohol Use: No  . Drug Use: No  . Sexual Activity: Not on file   Other Topics Concern  . Not on file   Social History Narrative   Back living at her house since the last visit, GGson lives w/ her (79 y/o)   Had 3 daughter- 2 son  (lost oldest daughter and son), 2 living daughters in [place]    Past Surgical History  Procedure Laterality Date  . Abdominal hysterectomy    . Total knee arthroplasty  1999  . Oophorectomy    . Cagb  1998  . Cesarean section      x 2  . Hemorrhoid surgery    . Colonoscopy with propofol N/A 03/30/2014    Procedure: COLONOSCOPY WITH PROPOFOL;  Surgeon: Irene Shipper, MD;  Location: [facility];  Service: Endoscopy;  Laterality:  N/A;  . Aortic valve replacement  1998  . Coronary artery bypass graft  1998    SVG TO RCA  . Transthoracic echocardiogram  09/11/11    LVEF >55%.STAGE 1 DIASTOLIC DYSFUNCTION,ELEVATED LV FILLING PRESSURE.BIOPROSTHETIC AORTIC VALVE-PEAK AND MEAN GRADIENTS OF 17 MMHG AND 8 MMHG.SIGMOID SEPTUM.MILD TO MOD MR.MILD TO MOD TR.RVSP 46 MMHG.  . Myocardial perfusion study  12/19/09    NORMAL PATTERN OF PERFUSION IN ALL REGIONS.EF 75%.  . Carotid doppler  07/13/12    BILATERAL BULB/PROXIMAL ICAS;MILD AMOUNT OF FIBROUS PLAQUE WITH NO DIAMETER REDUCTION.    Family History  Problem Relation Age of Onset  . Colon cancer Neg Hx   . Breast cancer Neg Hx   . Thyroid disease Neg Hx   . Coronary artery disease Son 43    deceased  . Heart attack Father   . Hypertension Brother   . Stroke Brother     Allergies  Allergen Reactions  . Hydrocodone Itching and Nausea Only  .  Tramadol Hcl Itching and Nausea Only    Current Outpatient Prescriptions on File Prior to Visit  Medication Sig Dispense Refill  . acetaminophen (TYLENOL) 500 MG tablet Take 1,000 mg by mouth every 6 (six) hours as needed for moderate pain (pain).    Marland Kitchen albuterol (PROAIR HFA) 108 (90 Base) MCG/ACT inhaler Inhale 2 puffs into the lungs every 6 (six) hours as needed for wheezing or shortness of breath. 18 g 6  . ALPRAZolam (XANAX) 0.5 MG tablet Take 1 tablet (0.5 mg total) by mouth at bedtime as needed for anxiety or sleep (insomnia). 30 tablet 1  . aspirin 81 MG tablet Take 81 mg by mouth daily.    Marland Kitchen atorvastatin (LIPITOR) 40 MG tablet Take 1 tablet (40 mg total) by mouth at bedtime. 30 tablet 3  . beclomethasone (QVAR) 80 MCG/ACT inhaler Inhale 2 puffs into the lungs 2 (two) times daily.    . benazepril (LOTENSIN) 20 MG tablet Take 1 tablet (20 mg total) by mouth daily. NEED OV. 30 tablet 0  . Calcium Carbonate (CALCIUM 500 PO) Take 500 mg by mouth at bedtime.     . cholecalciferol (VITAMIN D) 1000 UNITS tablet Take 1,000 Units by mouth at bedtime.     Marland Kitchen diltiazem (CARDIZEM CD) 120 MG 24 hr capsule Take 1 capsule (120 mg total) by mouth daily. 30 capsule 0  . dorzolamide-timolol (COSOPT) 22.3-6.8 MG/ML ophthalmic solution Place 1 drop into both eyes 2 (two) times daily.     Marland Kitchen esomeprazole (NEXIUM) 40 MG capsule Take 1 capsule (40 mg total) by mouth daily before breakfast. 30 capsule 6  . furosemide (LASIX) 20 MG tablet Take 2 tablets (40 mg total) by mouth daily. 60 tablet 6  . glucose blood test strip 1 each by Other route as needed for other (blood sugar test). Reported on 07/11/2015    . Ketoprofen 10 % CREA Apply twice a day to the knee as needed 120 g 2  . magnesium oxide (MAG-OX) 400 (241.3 Mg) MG tablet Take 1 tablet (400 mg total) by mouth daily. 30 tablet 6  . methimazole (TAPAZOLE) 5 MG tablet Take 5 mg by mouth 3 (three) times a week.    . metoprolol tartrate (LOPRESSOR) 25 MG tablet  TAKE 1 TABLET BY MOUTH 3 TIMES A DAY 90 tablet 5  . nitroGLYCERIN (NITROSTAT) 0.4 MG SL tablet Place 0.4 mg under the tongue every 5 (five) minutes as needed for chest pain (chest pain). Reported on 07/11/2015    .  ONETOUCH DELICA LANCETS 99991111 MISC Test blood sugars no more than twice daily. 100 each 12  . potassium chloride (K-DUR) 10 MEQ tablet Take 1 tablet (10 mEq total) by mouth daily. 30 tablet 6  . sitaGLIPtin (JANUVIA) 50 MG tablet Take 1 tablet (50 mg total) by mouth daily. 30 tablet 4  . TRAVATAN Z 0.004 % SOLN ophthalmic solution Place 1 drop into both eyes at bedtime.      No current facility-administered medications on file prior to visit.    BP 122/80 mmHg  Pulse 78  Temp(Src) 97.4 F (36.3 C) (Oral)  Resp 20  Ht 5\' 2"  (1.575 m)  Wt 176 lb 9.6 oz (80.105 kg)  BMI 32.29 kg/m2  SpO2 95%       Objective:   Physical Exam  General  Mental Status - Alert. General Appearance - Well groomed. Not in acute distress.  Skin Rashes- No Rashes.  HEENT Head- Normal. Ear Auditory Canal - Left- Normal. Right - Normal.Tympanic Membrane- Left- Normal. Right- Normal. Eye Sclera/Conjunctiva- Left- Normal. Right- Normal. Nose & Sinuses Nasal Mucosa- Left-  Mild boggy and Congested. Right-  Mild  boggy and Congested. Mouth & Throat Lips: Upper Lip- Normal: no dryness, cracking, pallor, cyanosis, or vesicular eruption. Lower Lip-Normal: no dryness, cracking, pallor, cyanosis or vesicular eruption. Buccal Mucosa- Bilateral- No Aphthous ulcers. Oropharynx- No Discharge or Erythema. Tonsils: Characteristics- Bilateral- No Erythema or Congestion. Size/Enlargement- Bilateral- No enlargement. Discharge- bilateral-None.  Neck Neck- Supple. No Masses.   Chest and Lung Exam Auscultation: Breath Sounds:- even and unlabored, but some expiratory wheezing.  Cardiovascular Auscultation:Rythm- Regular, rate and rhythm. Murmurs & Other Heart Sounds:Ausculatation of the heart reveal- No  Murmurs.  Lymphatic Head & Neck General Head & Neck Lymphatics: Bilateral: Description- No Localized lymphadenopathy.  Lower ext- negative homans signs. No pedal edema. calfs symmetric.       Assessment & Plan:  For wheezing continue proair as needed. Will add qvar inhaler.  Will go ahead and send in azithromycin to pharmacy. After lab and chest xray findings may advise to start(we will notify you when results in). If you start antibiotic  then also use probiotics.  Go ahead and restart your lasix. We are getting labs to see if you chf has flaired.  Follow up in 7 days or as needed  After hours I reviewed xray. No pulmonary edema and no pneumonia. But will get MA to call and advise to start azithromycin if chest congestion or productive cough worsens.

## 2015-07-26 NOTE — Telephone Encounter (Signed)
Spoke with Tye Maryland, RN with [facility] and she informed writer that the patient's daughter is refusing to call EMS 911 regarding the below symptoms. Per the daughter, she would like to see if her mother can be seen in office today, as opposed to going to the ED. Appointment scheduled for 07/26/15 at 2:45 PM with Mackie Pai, PA-C. Patient's daughter has been advised to seek the ED if her mother's symptoms begin to worsen prior to visit today.

## 2015-07-26 NOTE — Telephone Encounter (Signed)
Also on [facility]. If chest congestion or productive cough worsens then start azithromycin. That was sent to her pharmacy.

## 2015-07-26 NOTE — Telephone Encounter (Signed)
Patient Name: AMITIEL CRONIN DOB: 07/15/1922 Initial Comment Caller states mother coughing, wheezing and can't get her breath Nurse Assessment Nurse: Vallery Sa, RN, Cathy Date/Time (Eastern Time): 07/26/2015 11:40:30 AM Confirm and document reason for call. If symptomatic, describe symptoms. You must click the next button to save text entered. ---Caller states her mother developed cough with wheezing about two days ago. She has been using her Asthma inhalers. No severe struggling breathing difficulty. Alert and responsive. No fever. Has the patient traveled out of the country within the last 30 days? ---No Does the patient have any new or worsening symptoms? ---Yes Will a triage be completed? ---Yes Related visit to physician within the last 2 weeks? ---No Does the PT have any chronic conditions? (i.e. diabetes, asthma, etc.) ---Yes List chronic conditions. ---Asthma, Mini stroke about 2 years ago, Pacemaker Is this a behavioral health or substance abuse call? ---No Guidelines Guideline Title Affirmed Question Affirmed Notes Asthma Attack Severe difficulty breathing (e.g., struggling for each breath, speak in single words, pulse > 120) Final Disposition User Call EMS 911 Now [place], RN, Westley Hummer disagreed with the Call 911 disposition. Reinforced the Call 911 disposition. Ernesha asks if the MD can see her mother. Called the office backline and notified Ronnie. Connected Phylliss Blakes for further direction from MD. Disagree/Comply: Disagree Disagree/Comply Reason: Disagree with instructions

## 2015-07-26 NOTE — Patient Instructions (Signed)
For wheezing continue proair as needed. Will add qvar inhaler.  Will go ahead and send in azithromycin to pharmacy. After lab and chest xray findings may advise to start(we will notify you when results in). If you start antibiotic  then also use probiotics.  Go ahead and restart your lasix. We are getting labs to see if you chf has flaired.  Follow up in 7 days or as needed

## 2015-07-26 NOTE — Telephone Encounter (Signed)
Sent in flovent since qvar not on formulary.

## 2015-07-26 NOTE — Telephone Encounter (Signed)
Left message on pt's cell of the change in therapy. Pt's daughter was advised also of the change in therapy and daughter was advised that pt can disregard the voicemail. Pt voices understanding,

## 2015-07-28 NOTE — Telephone Encounter (Signed)
error:[id number] ° °

## 2015-07-31 ENCOUNTER — Other Ambulatory Visit: Payer: Self-pay | Admitting: Internal Medicine

## 2015-08-02 ENCOUNTER — Other Ambulatory Visit: Payer: Self-pay | Admitting: *Deleted

## 2015-08-02 MED ORDER — DILTIAZEM HCL ER COATED BEADS 120 MG PO CP24: 120.0000 mg | ORAL_CAPSULE | Freq: Every day | ORAL | Status: DC

## 2015-08-08 DIAGNOSIS — H18423 Band keratopathy, bilateral: Secondary | ICD-10-CM | POA: Diagnosis not present

## 2015-08-08 DIAGNOSIS — H25813 Combined forms of age-related cataract, bilateral: Secondary | ICD-10-CM | POA: Diagnosis not present

## 2015-08-08 DIAGNOSIS — H401134 Primary open-angle glaucoma, bilateral, indeterminate stage: Secondary | ICD-10-CM | POA: Diagnosis not present

## 2015-08-16 ENCOUNTER — Telehealth: Payer: Self-pay | Admitting: *Deleted

## 2015-08-16 DIAGNOSIS — H18423 Band keratopathy, bilateral: Secondary | ICD-10-CM | POA: Diagnosis not present

## 2015-08-16 DIAGNOSIS — E119 Type 2 diabetes mellitus without complications: Secondary | ICD-10-CM | POA: Diagnosis not present

## 2015-08-16 DIAGNOSIS — H2513 Age-related nuclear cataract, bilateral: Secondary | ICD-10-CM | POA: Diagnosis not present

## 2015-08-16 DIAGNOSIS — H01022 Squamous blepharitis right lower eyelid: Secondary | ICD-10-CM | POA: Diagnosis not present

## 2015-08-16 DIAGNOSIS — H01021 Squamous blepharitis right upper eyelid: Secondary | ICD-10-CM | POA: Diagnosis not present

## 2015-08-16 NOTE — Telephone Encounter (Signed)
Received Surgical Clearance papers requesting labs to be ordered and completed, patient's PMH and current medications [attached] for Superficial Keraectomy with EDTA on 09/19/15; forwarded to provider/SLS 03/08

## 2015-08-18 NOTE — Telephone Encounter (Signed)
Per provider, patient needs to schedule OV for Medical/Surgical Clearance [for [facility]]; please call patient and arrange/SLS 03/10 Thanks.

## 2015-08-21 NOTE — Telephone Encounter (Signed)
Called pt. Rebecca Skinner advising that an appt is needed.

## 2015-08-22 NOTE — Telephone Encounter (Signed)
Patient scheduled for 08/31/15 (Thu) 11:30am with PCP

## 2015-08-25 NOTE — Telephone Encounter (Signed)
Noted  

## 2015-08-27 ENCOUNTER — Other Ambulatory Visit: Payer: Self-pay | Admitting: Internal Medicine

## 2015-08-31 ENCOUNTER — Ambulatory Visit (INDEPENDENT_AMBULATORY_CARE_PROVIDER_SITE_OTHER): Payer: Medicare Other | Admitting: Internal Medicine

## 2015-08-31 ENCOUNTER — Encounter: Payer: Self-pay | Admitting: Internal Medicine

## 2015-08-31 ENCOUNTER — Telehealth: Payer: Self-pay

## 2015-08-31 VITALS — BP 118/76 | HR 66 | Temp 97.7°F | Ht 62.0 in | Wt 167.2 lb

## 2015-08-31 DIAGNOSIS — J4541 Moderate persistent asthma with (acute) exacerbation: Secondary | ICD-10-CM

## 2015-08-31 DIAGNOSIS — Z01818 Encounter for other preprocedural examination: Secondary | ICD-10-CM

## 2015-08-31 DIAGNOSIS — R39198 Other difficulties with micturition: Secondary | ICD-10-CM | POA: Diagnosis not present

## 2015-08-31 DIAGNOSIS — Z09 Encounter for follow-up examination after completed treatment for conditions other than malignant neoplasm: Secondary | ICD-10-CM

## 2015-08-31 DIAGNOSIS — H18423 Band keratopathy, bilateral: Secondary | ICD-10-CM | POA: Diagnosis not present

## 2015-08-31 DIAGNOSIS — R062 Wheezing: Secondary | ICD-10-CM | POA: Diagnosis not present

## 2015-08-31 MED ORDER — PREDNISONE 10 MG PO TABS: ORAL_TABLET | ORAL | Status: DC

## 2015-08-31 MED ORDER — MOMETASONE FURO-FORMOTEROL FUM 200-5 MCG/ACT IN AERO: 2.0000 | INHALATION_SPRAY | Freq: Two times a day (BID) | RESPIRATORY_TRACT | Status: DC

## 2015-08-31 NOTE — Progress Notes (Signed)
Subjective:    Patient ID: Rebecca Skinner, female    DOB: [birth year], 82 y.o.   MRN: [id number]  DOS:  08/31/2015 Type of visit - description : Surgical clearance Interval history: I asked the patient to come to the office, she was recommended to have band keratopathy bilateral eye surgery . Vision is limited, she is still able to do all her activities of daily living.  Additionally, she was seen with bronchospasm last month, since then she has not improved much. Continue with cough, denies fever or chills. For one week, she also has noted that decrease in urination.   Review of Systems  No sinus pain or congestion. No lower abdominal pain No dysuria, gross hematuria or difficulty urinating.  Past Medical History  Diagnosis Date  . Hypertension   . Hyperlipidemia   . CAD (coronary artery disease)     s/p CABG s/p AO valve replacement ([place] [facility])  . Recurrent UTI   . Paroxysmal atrial fibrillation ([place])     cards d/c coumadin 12/2008 d/t persisten NSR and frequent falls- restarted coumadin july 2011, now on Eliquis  . Hyperthyroidism   . Dry skin   . Osteoarthritis   . Osteopenia     per dexa 12/09  . Keloid     @ chest  . Dizziness     Chronic, admiet 07-2010,saw neuro, thought to be a peripheral issue   . Anxiety 11/20/2011  . CVA (cerebral infarction) 08-2013    multiple, L, d/t Afib, started eloquis  . Memory loss   . Asthma     [facility] DR Dariana Garbett  . Shortness of breath dyspnea   . TIA (transient ischemic attack)   . Diabetes mellitus     type 2   . GERD (gastroesophageal reflux disease)     Past Surgical History  Procedure Laterality Date  . Abdominal hysterectomy    . Total knee arthroplasty  1999  . Oophorectomy    . Cagb  1998  . Cesarean section      x 2  . Hemorrhoid surgery    . Colonoscopy with propofol N/A 03/30/2014    Procedure: COLONOSCOPY WITH PROPOFOL;  Surgeon: Irene Shipper, MD;  Location: [facility];  Service: Endoscopy;   Laterality: N/A;  . Aortic valve replacement  1998  . Coronary artery bypass graft  1998    SVG TO RCA  . Transthoracic echocardiogram  09/11/11    LVEF >55%.STAGE 1 DIASTOLIC DYSFUNCTION,ELEVATED LV FILLING PRESSURE.BIOPROSTHETIC AORTIC VALVE-PEAK AND MEAN GRADIENTS OF 17 MMHG AND 8 MMHG.SIGMOID SEPTUM.MILD TO MOD MR.MILD TO MOD TR.RVSP 46 MMHG.  . Myocardial perfusion study  12/19/09    NORMAL PATTERN OF PERFUSION IN ALL REGIONS.EF 75%.  . Carotid doppler  07/13/12    BILATERAL BULB/PROXIMAL ICAS;MILD AMOUNT OF FIBROUS PLAQUE WITH NO DIAMETER REDUCTION.    Social History   Social History  . Marital Status: Divorced    Spouse Name: N/A  . Number of Children: 4  . Years of Education: N/A   Occupational History  . retired     Social History Main Topics  . Smoking status: Former Research scientist (life sciences)  . Smokeless tobacco: Never Used     Comment: quit 2004, smoked 1.5 ppd  . Alcohol Use: No  . Drug Use: No  . Sexual Activity: Not on file   Other Topics Concern  . Not on file   Social History Narrative   Back living at her house since the last  visit, GGson lives w/ her (80 y/o)   Had 3 daughter- 2 son  (lost oldest daughter and son), 2 living daughters in [place]        Medication List       This list is accurate as of: 08/31/15 11:59 PM.  Always use your most recent med list.               acetaminophen 500 MG tablet  Commonly known as:  TYLENOL  Take 1,000 mg by mouth every 6 (six) hours as needed for moderate pain (pain).     albuterol 108 (90 Base) MCG/ACT inhaler  Commonly known as:  PROAIR HFA  Inhale 2 puffs into the lungs every 6 (six) hours as needed for wheezing or shortness of breath.     ALPRAZolam 0.5 MG tablet  Commonly known as:  XANAX  Take 1 tablet (0.5 mg total) by mouth at bedtime as needed for anxiety or sleep (insomnia).     aspirin 81 MG tablet  Take 81 mg by mouth daily.     atorvastatin 40 MG tablet  Commonly known as:  LIPITOR  Take 1 tablet (40  mg total) by mouth at bedtime.     benazepril 20 MG tablet  Commonly known as:  LOTENSIN  Take 1 tablet (20 mg total) by mouth daily. NEED OV.     CALCIUM 500 PO  Take 500 mg by mouth at bedtime.     cholecalciferol 1000 units tablet  Commonly known as:  VITAMIN D  Take 1,000 Units by mouth at bedtime.     diltiazem 120 MG 24 hr capsule  Commonly known as:  CARDIZEM CD  Take 1 capsule (120 mg total) by mouth daily. Please schedule appointment for further refills.     dorzolamide-timolol 22.3-6.8 MG/ML ophthalmic solution  Commonly known as:  COSOPT  Place 1 drop into both eyes 2 (two) times daily.     esomeprazole 40 MG capsule  Commonly known as:  NEXIUM  Take 1 capsule (40 mg total) by mouth daily before breakfast.     furosemide 20 MG tablet  Commonly known as:  LASIX  Take 2 tablets (40 mg total) by mouth daily.     glucose blood test strip  1 each by Other route as needed for other (blood sugar test). Reported on 08/31/2015     Ketoprofen 10 % Crea  Apply twice a day to the knee as needed     magnesium oxide 400 (241.3 Mg) MG tablet  Commonly known as:  MAG-OX  Take 1 tablet (400 mg total) by mouth daily.     methimazole 5 MG tablet  Commonly known as:  TAPAZOLE  Take 5 mg by mouth 3 (three) times a week.     metoprolol tartrate 25 MG tablet  Commonly known as:  LOPRESSOR  Take 1 tablet (25 mg total) by mouth 3 (three) times daily.     mometasone-formoterol 200-5 MCG/ACT Aero  Commonly known as:  DULERA  Inhale 2 puffs into the lungs 2 (two) times daily.     nitroGLYCERIN 0.4 MG SL tablet  Commonly known as:  NITROSTAT  Place 0.4 mg under the tongue every 5 (five) minutes as needed for chest pain (chest pain). Reported on [id number]     [facility] DELICA LANCETS [id number] Misc  Test blood sugars no more than twice daily.     potassium chloride 10 MEQ tablet  Commonly known as:  K-DUR  Take 1 tablet (10  mEq total) by mouth daily.     predniSONE 10 MG tablet    Commonly known as:  DELTASONE  4 tablets x 2 days, 3 tabs x 2 days, 2 tabs x 2 days, 1 tab x 2 days     sitaGLIPtin 50 MG tablet  Commonly known as:  JANUVIA  Take 1 tablet (50 mg total) by mouth daily.     TRAVATAN Z 0.004 % Soln ophthalmic solution  Generic drug:  Travoprost (BAK Free)  Place 1 drop into both eyes at bedtime.           Objective:   Physical Exam BP 118/76 mmHg  Pulse 66  Temp(Src) 97.7 F (36.5 C) (Oral)  Ht 5\' 2"  (1.575 m)  Wt 167 lb 4 oz (75.864 kg)  BMI 30.58 kg/m2  SpO2 95% General:   Well developed, well nourished.Marland Kitchen  HEENT:  Normocephalic . Face symmetric, atraumatic. Nose not congested, sinuses no TTP Lungs:  Multiple wheezing bilaterally, no crackles. Normal respiratory effort, no intercostal retractions, no accessory muscle use. Heart: Irregular, soft stone  murmur.  No pretibial edema bilaterally  Skin: Not pale. Not jaundice Neurologic:  alert &  knows her name, recognized Rebecca Skinner  her daughter, not oriented to time or space.  gait  assisted by a walker, limited  Psych--  Cognition and judgment appear intact.  Cooperative with normal attention span and concentration.  Behavior appropriate. No anxious or depressed appearing.        Assessment & Plan:   assessment DM HTN Hyperlipidemia Hyperthyroidism  Dr. Loanne Drilling GERD Asthma  Anxiety  On xanax  CV: Dr. Claiborne Billings --CHF --CAD, CABG --Aortic valve replacement --Paroxysmal atrial fibrillation, used to be on Coumadin,   Eliquis rx after the stroke 3-15, d/c 03-2014 d/t GI bleed , on ASA 81 --TIAs, CVA 2015 DJD Osteopenia 2009 Memory deficits  Recurrent UTIs Daughter  Jasmine Awe  PLAN Band keratopathy: 82 year old with dementia, high risk for any type of procedure. She has poor vision but still able to do her activities of daily living. Will get blood work to rule out secondary causes of the keratopathy: CMP, vitamin D, ACE, uric acids,  Mg and phosphorus. ---> declined labs   Otherwise will discuss with ophthalmology  Addendum: Spoke with ophthalmology, procedure will be done under sedation, no need to stop aspirin, is optional procedure but without it they won't be able to operate on her cataracts. Labs are to rule out secondary causes of keratopathy. Plan: Proceed with surgery if patient changes her mind, will do blood work at the next opportunity. Asthma: Exacerbated for several weeks: s/p zpack  Currently on albuterol as needed and Flovent. Plan: Chest x-ray, prednisone, stop Flovent, Dulera, albuterol as needed, follow-up 10 days. Daughter states she will do the x-ray tomorrow. Addendum: X-ray was okay however the patient continue wheezing after few days off prednisone, will send a second round of prednisone. JP 09/06/2015 LUTS: Check a UA and urine culture RTC 10 days   Today, I spent more than  45  min with the patient: >50% of the time counseling regards her concerns about having surgery, pro-cons, discussing case w/ Dr Patrice Paradise and subsequently with the patient's daughter.

## 2015-08-31 NOTE — Progress Notes (Signed)
Pre visit review using our clinic review tool, if applicable. No additional management support is needed unless otherwise documented below in the visit note. 

## 2015-08-31 NOTE — Patient Instructions (Signed)
GO TO THE LAB :      Get the blood work     GO TO THE FRONT DESK Schedule your next appointment for a  folloow up When?   10 days Fasting?  No    STOP BY THE FIRST FLOOR:  get the XR    Take prednisone as prescribed Stop Flovent, start Dulera twice a day Use albuterol as needed for wheezing Use Robitussin-DM as needed Call if you get worse

## 2015-08-31 NOTE — Telephone Encounter (Signed)
Received PA request [facility] from [facility] for Dulera 200 mcg/5 mg inhalers: Inhale 2 puffs into the lungs 2 times daily. PA completed via representative at [phone]. PA authorization #: [id number]. PA approved through 06/09/2016. PA request [facility]ed [facility] and sent for scanning.

## 2015-09-01 ENCOUNTER — Ambulatory Visit (HOSPITAL_BASED_OUTPATIENT_CLINIC_OR_DEPARTMENT_OTHER)
Admission: RE | Admit: 2015-09-01 | Discharge: 2015-09-01 | Disposition: A | Discharge status: Home / Self Care | Payer: Medicare Other | Source: Ambulatory Visit | Attending: Internal Medicine | Admitting: Internal Medicine

## 2015-09-01 DIAGNOSIS — Z951 Presence of aortocoronary bypass graft: Secondary | ICD-10-CM | POA: Diagnosis not present

## 2015-09-01 DIAGNOSIS — R918 Other nonspecific abnormal finding of lung field: Secondary | ICD-10-CM | POA: Diagnosis not present

## 2015-09-01 DIAGNOSIS — R39198 Other difficulties with micturition: Secondary | ICD-10-CM | POA: Diagnosis not present

## 2015-09-01 DIAGNOSIS — Z952 Presence of prosthetic heart valve: Secondary | ICD-10-CM | POA: Insufficient documentation

## 2015-09-01 DIAGNOSIS — I517 Cardiomegaly: Secondary | ICD-10-CM | POA: Insufficient documentation

## 2015-09-01 DIAGNOSIS — R062 Wheezing: Secondary | ICD-10-CM | POA: Diagnosis not present

## 2015-09-01 DIAGNOSIS — Z01818 Encounter for other preprocedural examination: Secondary | ICD-10-CM | POA: Diagnosis not present

## 2015-09-02 LAB — URINALYSIS, ROUTINE W REFLEX MICROSCOPIC
Bilirubin Urine: NEGATIVE
Glucose, UA: NEGATIVE
Hgb urine dipstick: NEGATIVE
Ketones, ur: NEGATIVE
Leukocytes, UA: NEGATIVE
Nitrite: NEGATIVE
Specific Gravity, Urine: 1.025 (ref 1.001–1.035)
pH: 5 (ref 5.0–8.0)

## 2015-09-02 LAB — URINALYSIS, MICROSCOPIC ONLY
BACTERIA UA: NONE SEEN [HPF]
RBC / HPF: NONE SEEN RBC/HPF (ref <=2)
WBC UA: NONE SEEN WBC/HPF (ref <=5)
YEAST: NONE SEEN [HPF]

## 2015-09-04 LAB — CULTURE, URINE COMPREHENSIVE

## 2015-09-06 MED ORDER — PREDNISONE 10 MG PO TABS: ORAL_TABLET | ORAL | Status: DC

## 2015-09-06 NOTE — Assessment & Plan Note (Signed)
Band keratopathy: 82 year old with dementia, high risk for any type of procedure. She has poor vision but still able to do her activities of daily living. Will get blood work to rule out secondary causes of the keratopathy: CMP, vitamin D, ACE, uric acids,  Mg and phosphorus. ---> declined labs  Otherwise will discuss with ophthalmology  Addendum: Spoke with ophthalmology, procedure will be done under sedation, no need to stop aspirin, is optional procedure but without it they won't be able to operate on her cataracts. Labs are to rule out secondary causes of keratopathy. Plan: Proceed with surgery if patient changes her mind, will do blood work at the next opportunity. Asthma: Exacerbated for several weeks: s/p zpack  Currently on albuterol as needed and Flovent. Plan: Chest x-ray, prednisone, stop Flovent, Dulera, albuterol as needed, follow-up 10 days. Daughter states she will do the x-ray tomorrow. Addendum: X-ray was okay however the patient continue wheezing after few days off prednisone, will send a second round of prednisone. JP 09/06/2015 LUTS: Check a UA and urine culture RTC 10 days

## 2015-09-06 NOTE — Telephone Encounter (Signed)
Pt has decided not to proceed w/ surgery at this time. Dr. Larose Kells has informed Dr. Patrice Paradise.

## 2015-09-11 ENCOUNTER — Telehealth: Payer: Self-pay | Admitting: Internal Medicine

## 2015-09-11 NOTE — Telephone Encounter (Signed)
Please advise 

## 2015-09-11 NOTE — Telephone Encounter (Signed)
LMOM: Hard to say what to do without knowing exactly what happened but many of the  medications have the potential to affect her seriously in high doses. Asked for a call back in the meantime watch her for mental status changes and check vital signs.

## 2015-09-11 NOTE — Telephone Encounter (Signed)
Caller name: Preslea Relationship to patient: Daughter Can be reached: [phone]   Reason for call: Patients daughter called stating that she had separated patients medications for her to take them and when she went to visit patient this morning all of the medications were gone. Does not know if patient took all of the medications. Patient stated that she flushed the meds. Daughter request call back to get Dr. Larose Kells suggestions.

## 2015-09-12 ENCOUNTER — Other Ambulatory Visit: Payer: Self-pay | Admitting: Internal Medicine

## 2015-09-12 NOTE — Telephone Encounter (Signed)
Please call the patient daughter, see how she's doing

## 2015-09-12 NOTE — Telephone Encounter (Signed)
LMOM informing Pt's daughter, Viany to return call. (Pt has f/u scheduled 09/13/2015).

## 2015-09-13 ENCOUNTER — Encounter: Payer: Self-pay | Admitting: Internal Medicine

## 2015-09-13 ENCOUNTER — Ambulatory Visit (INDEPENDENT_AMBULATORY_CARE_PROVIDER_SITE_OTHER): Payer: Medicare Other | Admitting: Internal Medicine

## 2015-09-13 VITALS — BP 118/76 | HR 96 | Temp 98.2°F | Ht 62.0 in | Wt 154.2 lb

## 2015-09-13 DIAGNOSIS — J4541 Moderate persistent asthma with (acute) exacerbation: Secondary | ICD-10-CM | POA: Diagnosis not present

## 2015-09-13 DIAGNOSIS — H18423 Band keratopathy, bilateral: Secondary | ICD-10-CM

## 2015-09-13 DIAGNOSIS — H18429 Band keratopathy, unspecified eye: Secondary | ICD-10-CM | POA: Insufficient documentation

## 2015-09-13 LAB — PHOSPHORUS: PHOSPHORUS: 3.7 mg/dL (ref 2.3–4.6)

## 2015-09-13 LAB — URIC ACID: Uric Acid, Serum: 5.3 mg/dL (ref 2.4–7.0)

## 2015-09-13 LAB — MAGNESIUM: MAGNESIUM: 1.9 mg/dL (ref 1.5–2.5)

## 2015-09-13 LAB — VITAMIN D 25 HYDROXY (VIT D DEFICIENCY, FRACTURES): VITD: 43.41 ng/mL (ref 30.00–100.00)

## 2015-09-13 MED ORDER — PREDNISONE 10 MG PO TABS: ORAL_TABLET | ORAL | Status: DC

## 2015-09-13 MED ORDER — MOMETASONE FURO-FORMOTEROL FUM 200-5 MCG/ACT IN AERO: 2.0000 | INHALATION_SPRAY | Freq: Two times a day (BID) | RESPIRATORY_TRACT | Status: DC

## 2015-09-13 MED FILL — DULERA 200 MCG/5 MCG INH: 200-5 | 30 days supply | Qty: 13 | Fill #0

## 2015-09-13 NOTE — Progress Notes (Signed)
Subjective:    Patient ID: Rebecca Skinner, female    DOB: 18-Mar-1923, 82 y.o.   MRN: [id number]  DOS:  09/13/2015 Type of visit - description : Follow-up Interval history:  Since the last office visit, respiratory symptoms have improved to a degree. See phone note from 09/11/2015, apparently has not been taking most of her medications, fortunately does not seem to be overdosed in  any way, she is not more sleepy, bradycardic or hypotensive.   Review of Systems  No fever chills No nausea or vomiting No GERD symptoms  Past Medical History  Diagnosis Date  . Hypertension   . Hyperlipidemia   . CAD (coronary artery disease)     s/p CABG s/p AO valve replacement ([place] [facility])  . Recurrent UTI   . Paroxysmal atrial fibrillation ([place])     cards d/c coumadin 12/2008 d/t persisten NSR and frequent falls- restarted coumadin july 2011, now on Eliquis  . Hyperthyroidism   . Dry skin   . Osteoarthritis   . Osteopenia     per dexa 12/09  . Keloid     @ chest  . Dizziness     Chronic, admiet 07-2010,saw neuro, thought to be a peripheral issue   . Anxiety 11/20/2011  . CVA (cerebral infarction) 08-2013    multiple, L, d/t Afib, started eloquis  . Memory loss   . Asthma     [facility] DR PAZ  . Shortness of breath dyspnea   . TIA (transient ischemic attack)   . Diabetes mellitus     type 2   . GERD (gastroesophageal reflux disease)     Past Surgical History  Procedure Laterality Date  . Abdominal hysterectomy    . Total knee arthroplasty  1999  . Oophorectomy    . Cagb  1998  . Cesarean section      x 2  . Hemorrhoid surgery    . Colonoscopy with propofol N/A 03/30/2014    Procedure: COLONOSCOPY WITH PROPOFOL;  Surgeon: Irene Shipper, MD;  Location: [facility];  Service: Endoscopy;  Laterality: N/A;  . Aortic valve replacement  1998  . Coronary artery bypass graft  1998    SVG TO RCA  . Transthoracic echocardiogram  09/11/11    LVEF >55%.STAGE 1 DIASTOLIC  DYSFUNCTION,ELEVATED LV FILLING PRESSURE.BIOPROSTHETIC AORTIC VALVE-PEAK AND MEAN GRADIENTS OF 17 MMHG AND 8 MMHG.SIGMOID SEPTUM.MILD TO MOD MR.MILD TO MOD TR.RVSP 46 MMHG.  . Myocardial perfusion study  12/19/09    NORMAL PATTERN OF PERFUSION IN ALL REGIONS.EF 75%.  . Carotid doppler  07/13/12    BILATERAL BULB/PROXIMAL ICAS;MILD AMOUNT OF FIBROUS PLAQUE WITH NO DIAMETER REDUCTION.    Social History   Social History  . Marital Status: Divorced    Spouse Name: N/A  . Number of Children: 4  . Years of Education: N/A   Occupational History  . retired     Social History Main Topics  . Smoking status: Former Research scientist (life sciences)  . Smokeless tobacco: Never Used     Comment: quit 2004, smoked 1.5 ppd  . Alcohol Use: No  . Drug Use: No  . Sexual Activity: Not on file   Other Topics Concern  . Not on file   Social History Narrative   Back living at her house since the last visit, [place] lives w/ her (82 y/o)   Had 3 daughter- 2 son  (lost oldest daughter and son), 2 living daughters in [place]  Medication List       This list is accurate as of: 09/13/15  5:44 PM.  Always use your most recent med list.               acetaminophen 500 MG tablet  Commonly known as:  TYLENOL  Take 1,000 mg by mouth every 6 (six) hours as needed for moderate pain (pain).     albuterol 108 (90 Base) MCG/ACT inhaler  Commonly known as:  PROAIR HFA  Inhale 2 puffs into the lungs every 6 (six) hours as needed for wheezing or shortness of breath.     ALPRAZolam 0.5 MG tablet  Commonly known as:  XANAX  Take 1 tablet (0.5 mg total) by mouth at bedtime as needed for anxiety or sleep (insomnia).     aspirin 81 MG tablet  Take 81 mg by mouth daily.     atorvastatin 40 MG tablet  Commonly known as:  LIPITOR  Take 1 tablet (40 mg total) by mouth at bedtime.     benazepril 20 MG tablet  Commonly known as:  LOTENSIN  Take 1 tablet (20 mg total) by mouth daily. NEED OV.     CALCIUM 500 PO  Take 500 mg  by mouth at bedtime.     cholecalciferol 1000 units tablet  Commonly known as:  VITAMIN D  Take 1,000 Units by mouth at bedtime.     diltiazem 120 MG 24 hr capsule  Commonly known as:  CARDIZEM CD  Take 1 capsule (120 mg total) by mouth daily. Please schedule appointment for further refills.     dorzolamide-timolol 22.3-6.8 MG/ML ophthalmic solution  Commonly known as:  COSOPT  Place 1 drop into both eyes 2 (two) times daily.     esomeprazole 40 MG capsule  Commonly known as:  NEXIUM  Take 1 capsule (40 mg total) by mouth daily before breakfast.     furosemide 20 MG tablet  Commonly known as:  LASIX  Take 2 tablets (40 mg total) by mouth daily.     glucose blood test strip  1 each by Other route as needed for other (blood sugar test). Reported on 09/13/2015     Ketoprofen 10 % Crea  Apply twice a day to the knee as needed     magnesium oxide 400 (241.3 Mg) MG tablet  Commonly known as:  MAG-OX  Take 1 tablet (400 mg total) by mouth daily.     methimazole 5 MG tablet  Commonly known as:  TAPAZOLE  Take 5 mg by mouth 3 (three) times a week.     metoprolol tartrate 25 MG tablet  Commonly known as:  LOPRESSOR  Take 1 tablet (25 mg total) by mouth 3 (three) times daily.     mometasone-formoterol 200-5 MCG/ACT Aero  Commonly known as:  DULERA  Inhale 2 puffs into the lungs 2 (two) times daily.     nitroGLYCERIN 0.4 MG SL tablet  Commonly known as:  NITROSTAT  Place 0.4 mg under the tongue every 5 (five) minutes as needed for chest pain (chest pain). Reported on 0000000     [facility] DELICA LANCETS [id number] Misc  Test blood sugars no more than twice daily.     potassium chloride 10 MEQ tablet  Commonly known as:  K-DUR  Take 1 tablet (10 mEq total) by mouth daily.     predniSONE 10 MG tablet  Commonly known as:  DELTASONE  4 tablets x 2 days, 3 tabs x 2 days, 2  tabs x 2 days, 1 tab x 2 days     sitaGLIPtin 50 MG tablet  Commonly known as:  JANUVIA  Take 1 tablet (50 mg  total) by mouth daily.     TRAVATAN Z 0.004 % Soln ophthalmic solution  Generic drug:  Travoprost (BAK Free)  Place 1 drop into both eyes at bedtime.           Objective:   Physical Exam BP 118/76 mmHg  Pulse 96  Temp(Src) 98.2 F (36.8 C) (Oral)  Ht 5\' 2"  (1.575 m)  Wt 154 lb 4 oz (69.967 kg)  BMI 28.21 kg/m2  SpO2 98% General:   Well developed, well nourished . NAD.  HEENT:  Normocephalic . Face symmetric, atraumatic Lungs:  Slightly decreased inspiratory effort but she has some wheezing bilaterally without rhonchi. Normal respiratory effort, no intercostal retractions, no accessory muscle use. Heart: RRR,  no murmur.  No pretibial edema bilaterally  Skin: Not pale. Not jaundice Neurologic: at baseline  Psych--  No anxious or depressed appearing.      Assessment & Plan:   assessment DM HTN Hyperlipidemia Hyperthyroidism  Dr. Loanne Drilling GERD Asthma  Anxiety  On xanax  CV: Dr. Claiborne Billings --CHF --CAD, CABG --Aortic valve replacement --Paroxysmal atrial fibrillation, used to be on Coumadin,   Eliquis rx after the stroke 3-15, d/c 03-2014 d/t GI bleed , on ASA 81 --TIAs, CVA 2015 DJD Osteopenia 2009 Memory deficits  Recurrent UTIs Daughter  Jasmine Awe  PLAN Band keratopathy: See previous entry, will do additional labs to rule out secondary etiology: ACE levels, vitamin D, magnesium, phosphorus and uric acid Asthma: Better but not completely well, still wheezing, unable to get [facility] so far. Provided a free cupon, advised daughter to call if unable to get the medication or if she is not improving. LUTS: Symptoms resolved RTC 2 months

## 2015-09-13 NOTE — Assessment & Plan Note (Signed)
Band keratopathy: See previous entry, will do additional labs to rule out secondary etiology: ACE levels, vitamin D, magnesium, phosphorus and uric acid Asthma: Better but not completely well, still wheezing, unable to get [facility] so far. Provided a free cupon, advised daughter to call if unable to get the medication or if she is not improving. LUTS: Symptoms resolved RTC 2 months

## 2015-09-13 NOTE — Patient Instructions (Signed)
Get the blood work before you leave  Try to get [facility] ASAP, if you are unable to get it within the next 2 or 3 days please call the office  Next visit in 2 months.

## 2015-09-13 NOTE — Progress Notes (Signed)
Pre visit review using our clinic review tool, if applicable. No additional management support is needed unless otherwise documented below in the visit note. 

## 2015-09-14 ENCOUNTER — Telehealth: Payer: Self-pay | Admitting: Internal Medicine

## 2015-09-14 NOTE — Telephone Encounter (Signed)
Bridgette - Education officer, museum for [facility] - [phone] ext. [phone]  She says that pt has been dealing with the flu , and weakness at home. She would like to know if pt qualify to have a home health nurse to go out to the home. She says that she would need to have it set up at her daughters home    [name] - [phone] [address] [place], [place]

## 2015-09-14 NOTE — Telephone Encounter (Signed)
Please advise 

## 2015-09-15 LAB — ANGIOTENSIN CONVERTING ENZYME: ANGIOTENSIN-CONVERTING ENZYME: 28 U/L (ref 8–52)

## 2015-09-15 NOTE — Telephone Encounter (Signed)
Yes, send a referral if needed

## 2015-09-15 NOTE — Telephone Encounter (Signed)
Tried Market researcher at [place], called number below, and used extension, however, extension number was not a correct extension number.

## 2015-09-23 ENCOUNTER — Ambulatory Visit (INDEPENDENT_AMBULATORY_CARE_PROVIDER_SITE_OTHER): Payer: Medicare Other | Admitting: Family Medicine

## 2015-09-23 VITALS — BP 108/68 | HR 50 | Temp 97.6°F | Resp 18 | Ht 61.0 in | Wt 162.0 lb

## 2015-09-23 DIAGNOSIS — H168 Other keratitis: Secondary | ICD-10-CM | POA: Insufficient documentation

## 2015-09-23 MED ORDER — ARTIFICIAL TEARS OP OINT: TOPICAL_OINTMENT | Freq: Every evening | OPHTHALMIC | Status: DC | PRN

## 2015-09-23 NOTE — Progress Notes (Signed)
82 yo with recurrent right eye pain diagnosed as "band keratopathy".  She is seeing Dr. Katy Fitch.    The pain this time began this morning, as it has hypertension she's gotten this.  Objective: Patient has arcus senilis. She has good range of motion of her eyes and pupils are reactive to light. She does seem to have photophobia and will not allow me to fever to lid.  She has significant relief when using proparacaine drop. Staining of the cornea with floor seen reveals a bandlike area faint uptake in the mid section of the cornea.    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 Katy Fitch Dr. Katy Fitch on Monday if pain persists Robyn Haber, MD

## 2015-09-23 NOTE — Patient Instructions (Addendum)
You appear to have an exposure keratitis on the right eye.  Get Lacri-lube and apply before bed to each eye.

## 2015-09-29 ENCOUNTER — Other Ambulatory Visit: Payer: Self-pay | Admitting: Cardiovascular Disease

## 2015-09-29 NOTE — Telephone Encounter (Signed)
REFILL 

## 2015-10-13 ENCOUNTER — Other Ambulatory Visit: Payer: Self-pay | Admitting: Cardiovascular Disease

## 2015-10-13 ENCOUNTER — Other Ambulatory Visit: Payer: Self-pay | Admitting: Internal Medicine

## 2015-10-13 NOTE — Telephone Encounter (Signed)
Rx sent to the pharmacy by e-script.//AB/CMA 

## 2015-10-13 NOTE — Telephone Encounter (Signed)
Rx Refill

## 2015-10-13 NOTE — Telephone Encounter (Signed)
Requesting Alprazolam 0.5mg -Take 1 tablet by mouth at bedtime as needed for anxiety or sleep *Insomnia*. Last refill:07-11-15;#30,1 Last OV:09-13-15 UDS:10-14-12-Low risk-Due Please advise.//AB/CMA

## 2015-10-16 NOTE — Telephone Encounter (Signed)
[facility]  

## 2015-10-16 NOTE — Telephone Encounter (Signed)
Okay 30 and 3 refills 

## 2015-10-16 NOTE — Telephone Encounter (Signed)
Rx printed, awaiting MD signature.  

## 2015-10-19 ENCOUNTER — Encounter: Payer: Self-pay | Admitting: Physician Assistant

## 2015-10-19 ENCOUNTER — Ambulatory Visit (INDEPENDENT_AMBULATORY_CARE_PROVIDER_SITE_OTHER): Payer: Medicare Other | Admitting: Physician Assistant

## 2015-10-19 VITALS — BP 130/88 | HR 69 | Ht 61.0 in | Wt 171.6 lb

## 2015-10-19 DIAGNOSIS — E785 Hyperlipidemia, unspecified: Secondary | ICD-10-CM | POA: Diagnosis not present

## 2015-10-19 DIAGNOSIS — Z954 Presence of other heart-valve replacement: Secondary | ICD-10-CM | POA: Diagnosis not present

## 2015-10-19 DIAGNOSIS — I4891 Unspecified atrial fibrillation: Secondary | ICD-10-CM

## 2015-10-19 DIAGNOSIS — I10 Essential (primary) hypertension: Secondary | ICD-10-CM

## 2015-10-19 DIAGNOSIS — Z952 Presence of prosthetic heart valve: Secondary | ICD-10-CM

## 2015-10-19 DIAGNOSIS — I5032 Chronic diastolic (congestive) heart failure: Secondary | ICD-10-CM

## 2015-10-19 NOTE — Progress Notes (Signed)
Patient ID: LITAL ZAPP, female   DOB: [birth year], 82 y.o.   MRN: [id number]    Date:  10/19/2015   ID:  VERNESE VANDERPOEL, DOB Jun 30, 1922, MRN [id number]  PCP:  Kathlene November, MD  Primary Cardiologist:  Claiborne Billings  Chief Complaint  Patient presents with  . Follow-up    needs meds refilled      History of Present Illness: BETSUA SILLIMAN is a 82 y.o. female who underwent a pericardial aortic valve replacement in 1998 and single vessel bypass with a vein graft to the right artery artery.  She also has a history of sick sinus syndrome with paroxysmal atrial fibrillation and was taken off Coumadin anticoagulation in the past due to multiple falls in 2009 in 2010. There is no history of CVA or TIAs. She does have significant arthritic issues with back discomfort and also arthritis of her knees.  History of GIB  Her last echo Doppler study was in 2013 which showed moderate asymmetric LVH with septal wall at 1.6 cm and posterior wall 1.1 cm of the sigmoid septum. Ejection fraction was greater than 55%. She had mild to moderate mitral regurgitation, mild to moderate tricuspid regurgitation with elevation of RV systolic pressure at 46 mm. There bioprosthetic aortic valve had a peak gradient of 17 and mean gradient of 8 within normal limits for this valve. On 08/12/2013 she underwent a nuclear perfusion study which was interpreted as intermediate risk with it suggested a fixed lateral defect with minimal peri-infarction ischemia. A CardioNet monitor had shown atrial fibrillation with bursts of increased heart rate.  She walks with a walker. She has undergone left knee replacement. She has history of diabetes mellitus as well as hypertension. She had been on dual antiplatelet therapy with aspirin and Plavix. She was hospitalized on on the neurology service from 3/20 - 242015 with CVA.  Carotid studies demonstrated mild internal carotid stenoses of less than 40%. During that evaluation, she apparently was taken off aspirin and  Plavix by Dr. [name] and was given a prescription for Eliquis 5 mg since was felt that her CVA was due to atrial fibrillation.  She later discontinued in October 2015 after she had rectal bleeding with a hemoglobin as low as 7.4.  Patient presents for regular evaluation. She has no particular complaints. We have not seen her since December 2015 so she was asked to come in.    The patient currently denies nausea, vomiting, fever, chest pain, shortness of breath, orthopnea, dizziness, PND, cough, congestion, abdominal pain, hematochezia, melena, lower extremity edema, claudication.  Wt Readings from Last 3 Encounters:  10/19/15 171 lb 9.6 oz (77.837 kg)  09/23/15 162 lb (73.483 kg)  09/13/15 154 lb 4 oz (69.967 kg)     Past Medical History  Diagnosis Date  . Hypertension   . Hyperlipidemia   . CAD (coronary artery disease)     s/p CABG s/p AO valve replacement ([facility])  . Recurrent UTI   . Paroxysmal atrial fibrillation ([place])     cards d/c coumadin 12/2008 d/t persisten NSR and frequent falls- restarted coumadin july 2011, now on Eliquis  . Hyperthyroidism   . Dry skin   . Osteoarthritis   . Osteopenia     per dexa 12/09  . Keloid     @ chest  . Dizziness     Chronic, admiet 07-2010,saw neuro, thought to be a peripheral issue   . Anxiety 11/20/2011  . CVA (cerebral infarction) 08-2013  multiple, L, d/t Afib, started eloquis  . Memory loss   . Asthma     UNDER THE [facility]ARE OP[facility] DR PAZ  . Shortness of breath dyspnea   . TIA (transient ischemic attack)   . Diabetes mellitus     type 2   . GERD (gastroesophageal reflux disease)     Current Outpatient Prescriptions  Medication Sig Dispense Refill  . acetaminophen (TYLENOL) 500 MG tablet Take 1,000 mg by mouth every 6 (six) hours as needed for moderate pain (pain).    Marland Kitchen albuterol (PROAIR HFA) 108 (90 Base) MCG/ACT inhaler Inhale 2 puffs into the lungs every 6 (six) hours as needed for wheezing or shortness of breath. 18 g 6  .  ALPRAZolam (XANAX) 0.5 MG tablet Take 1 tablet (0.5 mg total) by mouth at bedtime as needed for anxiety or sleep. 30 tablet 3  . artificial tears (LACRILUBE) OINT ophthalmic ointment Place into both eyes at bedtime as needed for dry eyes. 3.5 g 11  . aspirin 81 MG tablet Take 81 mg by mouth daily.    Marland Kitchen atorvastatin (LIPITOR) 40 MG tablet Take 1 tablet (40 mg total) by mouth at bedtime. 30 tablet 3  . benazepril (LOTENSIN) 20 MG tablet TAKE 1 TABLET (20 MG TOTAL) BY MOUTH DAILY. NEED OV. 30 tablet 0  . Calcium Carbonate (CALCIUM 500 PO) Take 500 mg by mouth at bedtime.     . cholecalciferol (VITAMIN D) 1000 UNITS tablet Take 1,000 Units by mouth at bedtime.     Marland Kitchen diltiazem (CARDIZEM SR) 120 MG 12 hr capsule TAKE ONE CAPSULE BY MOUTH EVERY DAY (NEED APPT FOR MORE REFILLS!!) 30 capsule 0  . dorzolamide-timolol (COSOPT) 22.3-6.8 MG/ML ophthalmic solution Place 1 drop into both eyes 2 (two) times daily.     Marland Kitchen esomeprazole (NEXIUM) 40 MG capsule TAKE 1 CAPSULE (40 MG TOTAL) BY MOUTH DAILY BEFORE BREAKFAST. 30 capsule 6  . furosemide (LASIX) 20 MG tablet Take 2 tablets (40 mg total) by mouth daily. 60 tablet 6  . glucose blood test strip 1 each by Other route as needed for other (blood sugar test). Reported on 09/13/2015    . Ketoprofen 10 % CREA Apply twice a day to the knee as needed 120 g 2  . KLOR-CON M10 10 MEQ tablet TAKE 1 TABLET BY MOUTH EVERY DAY 30 tablet 6  . magnesium oxide (MAG-OX) 400 (241.3 Mg) MG tablet Take 1 tablet (400 mg total) by mouth daily. 30 tablet 6  . methimazole (TAPAZOLE) 5 MG tablet Take 5 mg by mouth 3 (three) times a week.    . metoprolol tartrate (LOPRESSOR) 25 MG tablet Take 1 tablet (25 mg total) by mouth 3 (three) times daily. 90 tablet 5  . mometasone-formoterol (DULERA) 200-5 MCG/ACT AERO Inhale 2 puffs into the lungs 2 (two) times daily. 1 Inhaler 6  . nitroGLYCERIN (NITROSTAT) 0.4 MG SL tablet Place 0.4 mg under the tongue every 5 (five) minutes as needed for chest  pain (chest pain). Reported on 09/13/2015    . ONETOUCH DELICA LANCETS 99991111 MISC Test blood sugars no more than twice daily. 100 each 12  . predniSONE (DELTASONE) 10 MG tablet 4 tablets x 2 days, 3 tabs x 2 days, 2 tabs x 2 days, 1 tab x 2 days 20 tablet 0  . sitaGLIPtin (JANUVIA) 50 MG tablet Take 1 tablet (50 mg total) by mouth daily. 30 tablet 6  . TRAVATAN Z 0.004 % SOLN ophthalmic solution Place 1 drop into  both eyes at bedtime.      No current facility-administered medications for this visit.    Allergies:    Allergies  Allergen Reactions  . Hydrocodone Itching and Nausea Only  . Tramadol Hcl Itching and Nausea Only    Social History:  The patient  reports that she has quit smoking. She has never used smokeless tobacco. She reports that she does not drink alcohol or use illicit drugs.   Family history:   Family History  Problem Relation Age of Onset  . Colon cancer Neg Hx   . Breast cancer Neg Hx   . Thyroid disease Neg Hx   . Coronary artery disease Son 69    deceased  . Heart attack Father   . Hypertension Brother   . Stroke Brother     ROS:  Please see the history of present illness.  All other systems reviewed and negative.   PHYSICAL EXAM: VS:  BP 130/88 mmHg  Pulse 69  Ht 5\' 1"  (1.549 m)  Wt 171 lb 9.6 oz (77.837 kg)  BMI 32.44 kg/m2 Obese, well developed, in no acute distressShe appears her stated age. HEENT: Pupils are equal round react to light accommodation extraocular movements are intact.  Neck: no JVDNo cervical lymphadenopathy. Cardiac: Irregular rate and rhythm with a 1/6 systolic murmur Lungs:  clear to auscultation bilaterally, no wheezing, rhonchi or rales Abd: soft, nontender, positive bowel sounds all quadrants, no hepatosplenomegaly Ext: no lower extremity edema.  2+ radial and dorsalis pedis pulses. Skin: warm and dry Neuro:  Grossly normal  EKG:  Atrial fibrillation with a rate of 69 bpm   ASSESSMENT AND PLAN:  Problem List Items  Addressed This Visit    S/P AVR (aortic valve replacement)   Essential hypertension   Dyslipidemia   Chronic diastolic heart failure (HCC)   ATRIAL FIBRILLATION, chronic - Primary   Relevant Orders   EKG 12-Lead      Ms. Wittenborn presented for 18 month evaluation. She is now 82 years old and appears her stated age. She denies any specific complaints. She is in atrial fibrillation with controlled rate of 69 bpm. She takes Cardizem 120 daily and Lopressor 25 mg 3 times a day. She is not on anticoagulation because of prior GI bleeding on request. Blood pressure is controlled. His mild aortic murmur. She is also on Lipitor for her cholesterol. She appears euvolemic on exam and takes Lasix 20 mg daily.  FU in December

## 2015-10-19 NOTE — Patient Instructions (Signed)
Medication Instructions:   Your physician recommends that you continue on your current medications as directed. Please refer to the Current Medication list given to you today.   If you need a refill on your cardiac medications before your next appointment, please call your pharmacy.  Labwork: NONE ORDER TODAY    Testing/Procedures: NONE ORDER TODAY    Follow-Up: Your physician wants you to follow-up in:  IN  6 MONTHS DR [place] will receive a reminder letter in the mail two months in advance. If you don't receive a letter, please call our office to schedule the follow-up appointment.      Any Other Special Instructions Will Be Listed Below (If Applicable).

## 2015-10-21 ENCOUNTER — Other Ambulatory Visit: Payer: Self-pay | Admitting: Endocrinology

## 2015-10-27 ENCOUNTER — Other Ambulatory Visit: Payer: Self-pay | Admitting: Cardiovascular Disease

## 2015-10-27 NOTE — Telephone Encounter (Signed)
Rx(s) sent to pharmacy electronically.  

## 2015-11-01 ENCOUNTER — Encounter: Payer: Self-pay | Admitting: Medical

## 2015-11-01 ENCOUNTER — Ambulatory Visit (INDEPENDENT_AMBULATORY_CARE_PROVIDER_SITE_OTHER): Payer: Medicare Other | Admitting: Medical

## 2015-11-01 VITALS — BP 126/86 | HR 59 | Temp 98.9°F | Ht 61.0 in | Wt 170.7 lb

## 2015-11-01 DIAGNOSIS — J209 Acute bronchitis, unspecified: Secondary | ICD-10-CM

## 2015-11-01 DIAGNOSIS — R062 Wheezing: Secondary | ICD-10-CM

## 2015-11-01 DIAGNOSIS — R059 Cough, unspecified: Secondary | ICD-10-CM

## 2015-11-01 DIAGNOSIS — R05 Cough: Secondary | ICD-10-CM

## 2015-11-01 MED ORDER — AZITHROMYCIN 250 MG PO TABS: ORAL_TABLET | ORAL | Status: DC

## 2015-11-01 MED ORDER — PREDNISONE 10 MG PO TABS: ORAL_TABLET | ORAL | Status: DC

## 2015-11-01 MED ORDER — IPRATROPIUM-ALBUTEROL 0.5-2.5 (3) MG/3ML IN SOLN
3.0000 mL | Freq: Once | RESPIRATORY_TRACT | Status: AC
  Administered 2015-11-01: 3 mL via RESPIRATORY_TRACT

## 2015-11-01 NOTE — Addendum Note (Signed)
Addended by: Tasia Catchings on: 11/01/2015 04:41 PM   Modules accepted: Orders

## 2015-11-01 NOTE — Progress Notes (Signed)
Subjective:    Patient ID: Rebecca Skinner, female    DOB: [birth year], 82 y.o.   MRN: [id number]  HPI  Pt in states since this Friday. Just started to wheeze and then started to cough up some mucus. No fever and no chills.   No sinus pressure.   Only states wheezing and chest congestion.   Review of Systems  Constitutional: Negative for fever, chills and fatigue.  HENT: Negative for congestion and ear pain.   Respiratory: Positive for cough, shortness of breath and wheezing. Negative for chest tightness.   Cardiovascular: Negative for chest pain and palpitations.  Gastrointestinal: Negative for abdominal pain.  Musculoskeletal: Negative for back pain.  Hematological: Negative for adenopathy. Does not bruise/bleed easily.    Past Medical History  Diagnosis Date  . Hypertension   . Hyperlipidemia   . CAD (coronary artery disease)     s/p CABG s/p AO valve replacement ([facility])  . Recurrent UTI   . Paroxysmal atrial fibrillation ([place])     cards d/c coumadin 12/2008 d/t persisten NSR and frequent falls- restarted coumadin july 2011, now on Eliquis  . Hyperthyroidism   . Dry skin   . Osteoarthritis   . Osteopenia     per dexa 12/09  . Keloid     @ chest  . Dizziness     Chronic, admiet 07-2010,saw neuro, thought to be a peripheral issue   . Anxiety 11/20/2011  . CVA (cerebral infarction) 08-2013    multiple, L, d/t Afib, started eloquis  . Memory loss   . Asthma     [facility] DR PAZ  . Shortness of breath dyspnea   . TIA (transient ischemic attack)   . Diabetes mellitus     type 2   . GERD (gastroesophageal reflux disease)      Social History   Social History  . Marital Status: Divorced    Spouse Name: N/A  . Number of Children: 4  . Years of Education: N/A   Occupational History  . retired     Social History Main Topics  . Smoking status: Former Research scientist (life sciences)  . Smokeless tobacco: Never Used     Comment: quit 2004, smoked 1.5 ppd  . Alcohol Use: No   . Drug Use: No  . Sexual Activity: Not on file   Other Topics Concern  . Not on file   Social History Narrative   Back living at her house since the last visit, GGson lives w/ her (80 y/o)   Had 3 daughter- 2 son  (lost oldest daughter and son), 2 living daughters in [place]    Past Surgical History  Procedure Laterality Date  . Abdominal hysterectomy    . Total knee arthroplasty  1999  . Oophorectomy    . Cagb  1998  . Cesarean section      x 2  . Hemorrhoid surgery    . Colonoscopy with propofol N/A 03/30/2014    Procedure: COLONOSCOPY WITH PROPOFOL;  Surgeon: Irene Shipper, MD;  Location: [facility];  Service: Endoscopy;  Laterality: N/A;  . Aortic valve replacement  1998  . Coronary artery bypass graft  1998    SVG TO RCA  . Transthoracic echocardiogram  09/11/11    LVEF >55%.STAGE 1 DIASTOLIC DYSFUNCTION,ELEVATED LV FILLING PRESSURE.BIOPROSTHETIC AORTIC VALVE-PEAK AND MEAN GRADIENTS OF 17 MMHG AND 8 MMHG.SIGMOID SEPTUM.MILD TO MOD MR.MILD TO MOD TR.RVSP 46 MMHG.  . Myocardial perfusion study  12/19/09  NORMAL PATTERN OF PERFUSION IN ALL REGIONS.EF 75%.  . Carotid doppler  07/13/12    BILATERAL BULB/PROXIMAL ICAS;MILD AMOUNT OF FIBROUS PLAQUE WITH NO DIAMETER REDUCTION.    Family History  Problem Relation Age of Onset  . Colon cancer Neg Hx   . Breast cancer Neg Hx   . Thyroid disease Neg Hx   . Coronary artery disease Son 16    deceased  . Heart attack Father   . Hypertension Brother   . Stroke Brother     Allergies  Allergen Reactions  . Hydrocodone Itching and Nausea Only  . Tramadol Hcl Itching and Nausea Only    Current Outpatient Prescriptions on File Prior to Visit  Medication Sig Dispense Refill  . acetaminophen (TYLENOL) 500 MG tablet Take 1,000 mg by mouth every 6 (six) hours as needed for moderate pain (pain).    Marland Kitchen albuterol (PROAIR HFA) 108 (90 Base) MCG/ACT inhaler Inhale 2 puffs into the lungs every 6 (six) hours as needed for wheezing or  shortness of breath. 18 g 6  . ALPRAZolam (XANAX) 0.5 MG tablet Take 1 tablet (0.5 mg total) by mouth at bedtime as needed for anxiety or sleep. 30 tablet 3  . artificial tears (LACRILUBE) OINT ophthalmic ointment Place into both eyes at bedtime as needed for dry eyes. 3.5 g 11  . aspirin 81 MG tablet Take 81 mg by mouth daily.    . benazepril (LOTENSIN) 20 MG tablet TAKE 1 TABLET (20 MG TOTAL) BY MOUTH DAILY. NEED OV. 30 tablet 0  . Calcium Carbonate (CALCIUM 500 PO) Take 500 mg by mouth at bedtime.     . cholecalciferol (VITAMIN D) 1000 UNITS tablet Take 1,000 Units by mouth at bedtime.     Marland Kitchen diltiazem (CARDIZEM SR) 120 MG 12 hr capsule Take 1 capsule (120 mg total) by mouth 2 (two) times daily. 30 capsule 6  . dorzolamide-timolol (COSOPT) 22.3-6.8 MG/ML ophthalmic solution Place 1 drop into both eyes 2 (two) times daily.     Marland Kitchen esomeprazole (NEXIUM) 40 MG capsule TAKE 1 CAPSULE (40 MG TOTAL) BY MOUTH DAILY BEFORE BREAKFAST. 30 capsule 6  . furosemide (LASIX) 20 MG tablet Take 2 tablets (40 mg total) by mouth daily. 60 tablet 6  . glucose blood test strip 1 each by Other route as needed for other (blood sugar test). Reported on 09/13/2015    . Ketoprofen 10 % CREA Apply twice a day to the knee as needed 120 g 2  . KLOR-CON M10 10 MEQ tablet TAKE 1 TABLET BY MOUTH EVERY DAY 30 tablet 6  . magnesium oxide (MAG-OX) 400 (241.3 Mg) MG tablet Take 1 tablet (400 mg total) by mouth daily. 30 tablet 6  . methimazole (TAPAZOLE) 5 MG tablet Take 5 mg by mouth 3 (three) times a week.    . methimazole (TAPAZOLE) 5 MG tablet TAKE 1 TABLET BY MOUTH EVERY MONDAY, WEDNESDAY AND FRIDAY. APPOINTMENT NEEDED FOR FURTHER REFILLS 40 tablet 0  . metoprolol tartrate (LOPRESSOR) 25 MG tablet Take 1 tablet (25 mg total) by mouth 3 (three) times daily. 90 tablet 5  . mometasone-formoterol (DULERA) 200-5 MCG/ACT AERO Inhale 2 puffs into the lungs 2 (two) times daily. 1 Inhaler 6  . nitroGLYCERIN (NITROSTAT) 0.4 MG SL tablet  Place 0.4 mg under the tongue every 5 (five) minutes as needed for chest pain (chest pain). Reported on 09/13/2015    . ONETOUCH DELICA LANCETS 99991111 MISC Test blood sugars no more than twice daily. 100 each  12  . sitaGLIPtin (JANUVIA) 50 MG tablet Take 1 tablet (50 mg total) by mouth daily. 30 tablet 6  . TRAVATAN Z 0.004 % SOLN ophthalmic solution Place 1 drop into both eyes at bedtime.      No current facility-administered medications on file prior to visit.    BP 126/86 mmHg  Pulse 59  Temp(Src) 98.9 F (37.2 C) (Oral)  Ht 5\' 1"  (1.549 m)  Wt 170 lb 11.2 oz (77.429 kg)  BMI 32.27 kg/m2  SpO2 98%       Objective:   Physical Exam  General  Mental Status - Alert. General Appearance - Well groomed. Not in acute distress.  Skin Rashes- No Rashes.  HEENT Head- Normal. Ear Auditory Canal - Left- Normal. Right - Normal.Tympanic Membrane- Left- Normal. Right- Normal. Eye Sclera/Conjunctiva- Left- Normal. Right- Normal. Nose & Sinuses Nasal Mucosa- Left-  Not Boggy and Congested. Right-  Not Boggy and  Congested.Bilateral no  maxillary and  No frontal sinus pressure. Mouth & Throat Lips: Upper Lip- Normal: no dryness, cracking, pallor, cyanosis, or vesicular eruption. Lower Lip-Normal: no dryness, cracking, pallor, cyanosis or vesicular eruption. Buccal Mucosa- Bilateral- No Aphthous ulcers. Oropharynx- No Discharge or Erythema. Tonsils: Characteristics- Bilateral- No Erythema or Congestion. Size/Enlargement- Bilateral- No enlargement. Discharge- bilateral-None.  Neck Neck- Supple. No Masses.   Chest and Lung Exam Auscultation: Breath Sounds:- shallow and scattered wheezing. Even. Post neb much clear and deeper. But still some.  Cardiovascular Auscultation:Rythm- Regular, rate and rhythm. Murmurs & Other Heart Sounds:Ausculatation of the heart reveal- No Murmurs.  Lymphatic Head & Neck General Head & Neck Lymphatics: Bilateral: Description- No Localized  lymphadenopathy.       Assessment & Plan:  For your wheezing we gave neb treatment in office.  Will rx tapered prednisone.(advised on decrease sugar intake while on med.) Last a1-c very good.  Use proair inhaler if needed.   for bronchitis. Rx azithromycin for infection.  Will put in cxr order to get in event signs and symptoms persist by Monday.  Follow up 7 days or as needed   Zuhair Lariccia, Percell Miller, [employer]

## 2015-11-01 NOTE — Patient Instructions (Addendum)
For your wheezing we gave neb treatment in office.  Will rx tapered prednisone.  Use proair inhaler if needed.   For bronchitis. Rx azithromycin for infection.  Will put in cxr order to get in event signs and symptoms persist by Monday.  Follow up 7 days or as needed

## 2015-11-01 NOTE — Progress Notes (Signed)
Pre visit review using our clinic review tool, if applicable. No additional management support is needed unless otherwise documented below in the visit note. 

## 2015-11-03 ENCOUNTER — Other Ambulatory Visit: Payer: Self-pay | Admitting: Internal Medicine

## 2015-11-03 ENCOUNTER — Ambulatory Visit: Payer: Medicare Other | Admitting: Medical

## 2015-11-03 DIAGNOSIS — R52 Pain, unspecified: Secondary | ICD-10-CM | POA: Diagnosis not present

## 2015-11-03 DIAGNOSIS — R069 Unspecified abnormalities of breathing: Secondary | ICD-10-CM | POA: Diagnosis not present

## 2015-11-10 ENCOUNTER — Ambulatory Visit (INDEPENDENT_AMBULATORY_CARE_PROVIDER_SITE_OTHER): Payer: Medicare Other | Admitting: Internal Medicine

## 2015-11-10 ENCOUNTER — Encounter: Payer: Self-pay | Admitting: Internal Medicine

## 2015-11-10 ENCOUNTER — Telehealth: Payer: Self-pay

## 2015-11-10 ENCOUNTER — Other Ambulatory Visit: Payer: Self-pay

## 2015-11-10 VITALS — BP 128/66 | HR 53 | Temp 97.8°F | Ht 61.0 in | Wt 172.1 lb

## 2015-11-10 DIAGNOSIS — J4550 Severe persistent asthma, uncomplicated: Secondary | ICD-10-CM | POA: Diagnosis not present

## 2015-11-10 DIAGNOSIS — Z09 Encounter for follow-up examination after completed treatment for conditions other than malignant neoplasm: Secondary | ICD-10-CM

## 2015-11-10 DIAGNOSIS — J45909 Unspecified asthma, uncomplicated: Secondary | ICD-10-CM

## 2015-11-10 MED ORDER — METHIMAZOLE 5 MG PO TABS: 5.0000 mg | ORAL_TABLET | ORAL | Status: DC

## 2015-11-10 MED ORDER — POTASSIUM CHLORIDE CRYS ER 10 MEQ PO TBCR: 10.0000 meq | EXTENDED_RELEASE_TABLET | Freq: Every day | ORAL | Status: DC

## 2015-11-10 MED ORDER — LEVALBUTEROL HCL 0.63 MG/3ML IN NEBU: 0.6300 mg | INHALATION_SOLUTION | Freq: Two times a day (BID) | RESPIRATORY_TRACT | Status: DC

## 2015-11-10 MED ORDER — SITAGLIPTIN PHOSPHATE 50 MG PO TABS: 50.0000 mg | ORAL_TABLET | Freq: Every day | ORAL | Status: DC

## 2015-11-10 MED ORDER — FUROSEMIDE 20 MG PO TABS: 40.0000 mg | ORAL_TABLET | Freq: Every day | ORAL | Status: DC

## 2015-11-10 MED ORDER — ATORVASTATIN CALCIUM 40 MG PO TABS: 40.0000 mg | ORAL_TABLET | Freq: Every day | ORAL | Status: DC

## 2015-11-10 MED ORDER — ESOMEPRAZOLE MAGNESIUM 40 MG PO CPDR: 40.0000 mg | DELAYED_RELEASE_CAPSULE | Freq: Every day | ORAL | Status: DC

## 2015-11-10 MED ORDER — MAGNESIUM OXIDE 400 (241.3 MG) MG PO TABS: 1.0000 | ORAL_TABLET | Freq: Every day | ORAL | Status: DC

## 2015-11-10 MED ORDER — BUDESONIDE 0.5 MG/2ML IN SUSP: 0.5000 mg | Freq: Two times a day (BID) | RESPIRATORY_TRACT | Status: DC

## 2015-11-10 MED ORDER — METOPROLOL TARTRATE 25 MG PO TABS: 25.0000 mg | ORAL_TABLET | Freq: Three times a day (TID) | ORAL | Status: DC

## 2015-11-10 NOTE — Assessment & Plan Note (Signed)
Band keratopathy: Additional labs were negative. Asthma: Status post exacerbation, now back to baseline, symptoms are chronically uncontrolled. Part of the issue is she has not been able to get medications such as Dulera consistently. Plan:  Albuterol as needed, pulmicort respules 0.5 mg bid  xoponex neb BID not PRN  RTC  59months

## 2015-11-10 NOTE — Telephone Encounter (Signed)
Rx's sent, DME order for Nebulizer placed.

## 2015-11-10 NOTE — Patient Instructions (Signed)
Keep taking the same medicines  Refills of methimazole from Dr Loanne Drilling  Albuterol as needed  Will set up daily  nebulizations for you  If the cough is not better : let me know  Next visit 2 months

## 2015-11-10 NOTE — Progress Notes (Signed)
Pre visit review using our clinic review tool, if applicable. No additional management support is needed unless otherwise documented below in the visit note. 

## 2015-11-10 NOTE — Telephone Encounter (Signed)
Please refill x 1 Ov is due  

## 2015-11-10 NOTE — Telephone Encounter (Signed)
Rx sent and appointment letter mailed.  

## 2015-11-10 NOTE — Telephone Encounter (Signed)
Pt's daughter requesting 90 day supply.

## 2015-11-10 NOTE — Progress Notes (Signed)
Subjective:    Patient ID: Rebecca Skinner, female    DOB: 07-14-1922, 82 y.o.   MRN: [id number]  DOS:  11/10/2015 Type of visit - description : Follow-up, here with her daughter Interval history:  10 days ago was seen with asthma exacerbation, received antibiotics, a nebulization, prednisone. Chest x-ray was not done. Since then she is better but not completely well, still wheezing but back to  baseline.  Review of Systems Denies fever chills No chest pain, no lower extremity edema No nausea, vomiting, diarrhea  Past Medical History  Diagnosis Date  . Hypertension   . Hyperlipidemia   . CAD (coronary artery disease)     s/p CABG s/p AO valve replacement ([place] [facility])  . Recurrent UTI   . Paroxysmal atrial fibrillation ([place])     cards d/c coumadin 12/2008 d/t persisten NSR and frequent falls- restarted coumadin july 2011, now on Eliquis  . Hyperthyroidism   . Dry skin   . Osteoarthritis   . Osteopenia     per dexa 12/09  . Keloid     @ chest  . Dizziness     Chronic, admiet 07-2010,saw neuro, thought to be a peripheral issue   . Anxiety 11/20/2011  . CVA (cerebral infarction) 08-2013    multiple, L, d/t Afib, started eloquis  . Memory loss   . Asthma     UNDER THE [facility]ARE OP[facility] DR PAZ  . Shortness of breath dyspnea   . TIA (transient ischemic attack)   . Diabetes mellitus     type 2   . GERD (gastroesophageal reflux disease)     Past Surgical History  Procedure Laterality Date  . Abdominal hysterectomy    . Total knee arthroplasty  1999  . Oophorectomy    . Cagb  1998  . Cesarean section      x 2  . Hemorrhoid surgery    . Colonoscopy with propofol N/A 03/30/2014    Procedure: COLONOSCOPY WITH PROPOFOL;  Surgeon: Irene Shipper, MD;  Location: [facility];  Service: Endoscopy;  Laterality: N/A;  . Aortic valve replacement  1998  . Coronary artery bypass graft  1998    SVG TO RCA  . Transthoracic echocardiogram  09/11/11    LVEF >55%.STAGE 1 DIASTOLIC  DYSFUNCTION,ELEVATED LV FILLING PRESSURE.BIOPROSTHETIC AORTIC VALVE-PEAK AND MEAN GRADIENTS OF 17 MMHG AND 8 MMHG.SIGMOID SEPTUM.MILD TO MOD MR.MILD TO MOD TR.RVSP 46 MMHG.  . Myocardial perfusion study  12/19/09    NORMAL PATTERN OF PERFUSION IN ALL REGIONS.EF 75%.  . Carotid doppler  07/13/12    BILATERAL BULB/PROXIMAL ICAS;MILD AMOUNT OF FIBROUS PLAQUE WITH NO DIAMETER REDUCTION.    Social History   Social History  . Marital Status: Divorced    Spouse Name: N/A  . Number of Children: 4  . Years of Education: N/A   Occupational History  . retired     Social History Main Topics  . Smoking status: Former Research scientist (life sciences)  . Smokeless tobacco: Never Used     Comment: quit 2004, smoked 1.5 ppd  . Alcohol Use: No  . Drug Use: No  . Sexual Activity: Not on file   Other Topics Concern  . Not on file   Social History Narrative   Back living at her house since the last visit, GGson lives w/ her (80 y/o)   Had 3 daughter- 2 son  (lost oldest daughter and son), 2 living daughters in [place]        Medication List  This list is accurate as of: 11/10/15  5:23 PM.  Always use your most recent med list.               acetaminophen 500 MG tablet  Commonly known as:  TYLENOL  Take 1,000 mg by mouth every 6 (six) hours as needed for moderate pain (pain).     albuterol 108 (90 Base) MCG/ACT inhaler  Commonly known as:  PROAIR HFA  Inhale 2 puffs into the lungs every 6 (six) hours as needed for wheezing or shortness of breath.     ALPRAZolam 0.5 MG tablet  Commonly known as:  XANAX  Take 1 tablet (0.5 mg total) by mouth at bedtime as needed for anxiety or sleep.     artificial tears Oint ophthalmic ointment  Place into both eyes at bedtime as needed for dry eyes.     aspirin 81 MG tablet  Take 81 mg by mouth daily.     atorvastatin 40 MG tablet  Commonly known as:  LIPITOR  Take 1 tablet (40 mg total) by mouth at bedtime.     azithromycin 250 MG tablet  Commonly known as:   ZITHROMAX  Take 2 tablets by mouth on day 1, followed by 1 tablet by mouth daily for 4 days.     benazepril 20 MG tablet  Commonly known as:  LOTENSIN  TAKE 1 TABLET (20 MG TOTAL) BY MOUTH DAILY. NEED OV.     budesonide 0.5 MG/2ML nebulizer solution  Commonly known as:  PULMICORT  Take 2 mLs (0.5 mg total) by nebulization 2 (two) times daily.     CALCIUM 500 PO  Take 500 mg by mouth at bedtime.     cholecalciferol 1000 units tablet  Commonly known as:  VITAMIN D  Take 1,000 Units by mouth at bedtime.     diltiazem 120 MG 12 hr capsule  Commonly known as:  CARDIZEM SR  Take 1 capsule (120 mg total) by mouth 2 (two) times daily.     dorzolamide-timolol 22.3-6.8 MG/ML ophthalmic solution  Commonly known as:  COSOPT  Place 1 drop into both eyes 2 (two) times daily.     esomeprazole 40 MG capsule  Commonly known as:  NEXIUM  Take 1 capsule (40 mg total) by mouth daily before breakfast.     furosemide 20 MG tablet  Commonly known as:  LASIX  Take 2 tablets (40 mg total) by mouth daily.     glucose blood test strip  1 each by Other route as needed for other (blood sugar test). Reported on 11/10/2015     Ketoprofen 10 % Crea  Apply twice a day to the knee as needed     levalbuterol 0.63 MG/3ML nebulizer solution  Commonly known as:  XOPENEX  Take 3 mLs (0.63 mg total) by nebulization 2 (two) times daily.     magnesium oxide 400 (241.3 Mg) MG tablet  Commonly known as:  MAG-OX  Take 1 tablet (400 mg total) by mouth daily.     methimazole 5 MG tablet  Commonly known as:  TAPAZOLE  Take 1 tablet (5 mg total) by mouth 3 (three) times a week.     metoprolol tartrate 25 MG tablet  Commonly known as:  LOPRESSOR  Take 1 tablet (25 mg total) by mouth 3 (three) times daily.     nitroGLYCERIN 0.4 MG SL tablet  Commonly known as:  NITROSTAT  Place 0.4 mg under the tongue every 5 (five) minutes as needed for  chest pain (chest pain). Reported on [id number]     [facility] DELICA  LANCETS [id number] Misc  Test blood sugars no more than twice daily.     potassium chloride 10 MEQ tablet  Commonly known as:  KLOR-CON M10  Take 1 tablet (10 mEq total) by mouth daily.     predniSONE 10 MG tablet  Commonly known as:  DELTASONE  5 tab po day 1, 4 tab po day 2, 3 tab po day 3, 2 tab po day 4, 1 tab po day 5.     sitaGLIPtin 50 MG tablet  Commonly known as:  JANUVIA  Take 1 tablet (50 mg total) by mouth daily.     TRAVATAN Z 0.004 % Soln ophthalmic solution  Generic drug:  Travoprost (BAK Free)  Place 1 drop into both eyes at bedtime.           Objective:   Physical Exam BP 128/66 mmHg  Pulse 53  Temp(Src) 97.8 F (36.6 C) (Oral)  Ht 5\' 1"  (1.549 m)  Wt 172 lb 2 oz (78.075 kg)  BMI 32.54 kg/m2  SpO2 95% General:   Well developed, well nourished . NAD.  HEENT:  Normocephalic . Face symmetric, atraumatic Lungs:  Wheezing bilaterally, few rhonchi. No crackles. Normal respiratory effort, no intercostal retractions, no accessory muscle use. Heart: Irregular, soft murmur.  No pretibial edema bilaterally  Skin: Not pale. Not jaundice Neurologic:  alert , cooperative. Speech normal, gait limited, motivating [facility]--  Cognition and judgment appear intact.  Cooperative with normal attention span and concentration.  Behavior appropriate. No anxious or depressed appearing.      Assessment & Plan:   assessment DM HTN Hyperlipidemia Hyperthyroidism  Dr. Loanne Drilling GERD Asthma  Anxiety  On xanax  CV: Dr. Claiborne Billings --CHF --CAD, CABG --Aortic valve replacement --Paroxysmal atrial fibrillation, used to be on Coumadin,   Eliquis rx after the stroke 3-15, d/c 03-2014 d/t GI bleed , on ASA 81 --TIAs, CVA 2015 DJD Osteopenia 2009 Memory deficits  Recurrent UTIs Daughter  Moana Leidecker  PLAN Band keratopathy: Additional labs were negative. Asthma: Status post exacerbation, now back to baseline, symptoms are chronically uncontrolled. Part of the issue is she  has not been able to get medications such as Dulera consistently. Plan:  Albuterol as needed, pulmicort respules 0.5 mg bid  xoponex neb BID not PRN  RTC  83months

## 2015-11-10 NOTE — Telephone Encounter (Signed)
-----   Message from Darlina Guys sent at 11/10/2015  3:43 PM EDT ----- Regarding: RE: Melissa, pt needs nebs, thx! Yes, we will need a DME order entered into Epic for a nebulizer.  [facility] no longer provides neb medication. We do partner with a 3rd party pharmacy for neb meds so you can fax [place] the Rx and we will forward it to [place] or you could fax the meds script to the pt's local pharmacy.  Fax # [phone]  Just let me know when the DME order is ready and I'll pull everything. Thanks Melissa  ----- Message -----    From: Colon Branch, MD    Sent: 11/10/2015   2:39 PM      To: Darlina Guys, Colon Branch, MD, # Subject: Lenna Sciara, pt needs nebs, thx!                   This lady needs the following pulmicort respules 0.5 mg nebulization twice a day    xoponex   nebulization twice a day, not PRN  Please help to set up, they never used a nebulizer, needs some training by RT. Do we need to send RXs ? Referral?

## 2015-11-16 DIAGNOSIS — J4550 Severe persistent asthma, uncomplicated: Secondary | ICD-10-CM | POA: Diagnosis not present

## 2015-11-21 ENCOUNTER — Other Ambulatory Visit: Payer: Self-pay | Admitting: Cardiovascular Disease

## 2015-11-21 DIAGNOSIS — R069 Unspecified abnormalities of breathing: Secondary | ICD-10-CM | POA: Diagnosis not present

## 2015-11-21 NOTE — Telephone Encounter (Signed)
Rx Refill

## 2015-12-07 ENCOUNTER — Ambulatory Visit (HOSPITAL_BASED_OUTPATIENT_CLINIC_OR_DEPARTMENT_OTHER)
Admission: RE | Admit: 2015-12-07 | Discharge: 2015-12-07 | Disposition: A | Discharge status: Home / Self Care | Payer: Medicare Other | Source: Ambulatory Visit | Attending: Medical | Admitting: Medical

## 2015-12-07 ENCOUNTER — Other Ambulatory Visit: Payer: Self-pay | Admitting: Medical

## 2015-12-07 ENCOUNTER — Encounter: Payer: Self-pay | Admitting: Medical

## 2015-12-07 ENCOUNTER — Ambulatory Visit (INDEPENDENT_AMBULATORY_CARE_PROVIDER_SITE_OTHER): Payer: Medicare Other | Admitting: Medical

## 2015-12-07 VITALS — BP 124/68 | HR 55 | Temp 98.1°F | Ht 61.0 in | Wt 173.2 lb

## 2015-12-07 DIAGNOSIS — M25552 Pain in left hip: Secondary | ICD-10-CM

## 2015-12-07 DIAGNOSIS — Z9181 History of falling: Secondary | ICD-10-CM

## 2015-12-07 DIAGNOSIS — M25521 Pain in right elbow: Secondary | ICD-10-CM

## 2015-12-07 DIAGNOSIS — M79621 Pain in right upper arm: Secondary | ICD-10-CM | POA: Diagnosis not present

## 2015-12-07 DIAGNOSIS — M19011 Primary osteoarthritis, right shoulder: Secondary | ICD-10-CM | POA: Insufficient documentation

## 2015-12-07 DIAGNOSIS — S79912A Unspecified injury of left hip, initial encounter: Secondary | ICD-10-CM | POA: Diagnosis not present

## 2015-12-07 DIAGNOSIS — M898X1 Other specified disorders of bone, shoulder: Secondary | ICD-10-CM | POA: Diagnosis not present

## 2015-12-07 DIAGNOSIS — S4991XA Unspecified injury of right shoulder and upper arm, initial encounter: Secondary | ICD-10-CM | POA: Diagnosis not present

## 2015-12-07 DIAGNOSIS — M25511 Pain in right shoulder: Secondary | ICD-10-CM | POA: Diagnosis not present

## 2015-12-07 NOTE — Progress Notes (Signed)
Subjective:    Patient ID: Rebecca Skinner, female    DOB: April 22, 1923, 82 y.o.   MRN: [id number]  HPI  Pt in with some rt shoulder pain. She fell yesterday. Daughter found her. EMS came to help pick her up. Yesterday seemed not to want to move shoulder. But today she seems to be using her arm. No reported head trauma or loss consciousness. Pt did not report any head, nausea or vomiting. No chest pain. No ha reported.  Pt states just tripped or lost balance. No preceding cardiac or neurologic symptoms prior to fall.   Review of Systems  Constitutional: Negative for fever, chills and fatigue.  Respiratory: Negative for cough, choking and chest tightness.   Cardiovascular: Negative for chest pain and palpitations.  Gastrointestinal: Negative for vomiting, abdominal pain and diarrhea.  Musculoskeletal: Negative for back pain and neck pain.       Rt shoulder, scapula and humerus pain.  Skin: Negative for rash.  Neurological: Negative for dizziness, seizures, syncope, weakness and headaches.  Hematological: Negative for adenopathy. Does not bruise/bleed easily.  Psychiatric/Behavioral: Negative for behavioral problems and confusion.    Past Medical History  Diagnosis Date  . Hypertension   . Hyperlipidemia   . CAD (coronary artery disease)     s/p CABG s/p AO valve replacement ([place] [facility])  . Recurrent UTI   . Paroxysmal atrial fibrillation ([place])     cards d/c coumadin 12/2008 d/t persisten NSR and frequent falls- restarted coumadin july 2011, now on Eliquis  . Hyperthyroidism   . Dry skin   . Osteoarthritis   . Osteopenia     per dexa 12/09  . Keloid     @ chest  . Dizziness     Chronic, admiet 07-2010,saw neuro, thought to be a peripheral issue   . Anxiety 11/20/2011  . CVA (cerebral infarction) 08-2013    multiple, L, d/t Afib, started eloquis  . Memory loss   . Asthma     UNDER THE CARE OPF DR PAZ  . Shortness of breath dyspnea   . TIA (transient ischemic attack)   .  Diabetes mellitus     type 2   . GERD (gastroesophageal reflux disease)      Social History   Social History  . Marital Status: Divorced    Spouse Name: N/A  . Number of Children: 4  . Years of Education: N/A   Occupational History  . retired     Social History Main Topics  . Smoking status: Former Research scientist (life sciences)  . Smokeless tobacco: Never Used     Comment: quit 2004, smoked 1.5 ppd  . Alcohol Use: No  . Drug Use: No  . Sexual Activity: Not on file   Other Topics Concern  . Not on file   Social History Narrative   Back living at her house since the last visit, GGson lives w/ her (80 y/o)   Had 3 daughter- 2 son  (lost oldest daughter and son), 2 living daughters in [place]    Past Surgical History  Procedure Laterality Date  . Abdominal hysterectomy    . Total knee arthroplasty  1999  . Oophorectomy    . Cagb  1998  . Cesarean section      x 2  . Hemorrhoid surgery    . Colonoscopy with propofol N/A 03/30/2014    Procedure: COLONOSCOPY WITH PROPOFOL;  Surgeon: Irene Shipper, MD;  Location: [facility];  Service: Endoscopy;  Laterality: N/A;  .  Aortic valve replacement  1998  . Coronary artery bypass graft  1998    SVG TO RCA  . Transthoracic echocardiogram  09/11/11    LVEF >55%.STAGE 1 DIASTOLIC DYSFUNCTION,ELEVATED LV FILLING PRESSURE.BIOPROSTHETIC AORTIC VALVE-PEAK AND MEAN GRADIENTS OF 17 MMHG AND 8 MMHG.SIGMOID SEPTUM.MILD TO MOD MR.MILD TO MOD TR.RVSP 46 MMHG.  . Myocardial perfusion study  12/19/09    NORMAL PATTERN OF PERFUSION IN ALL REGIONS.EF 75%.  . Carotid doppler  07/13/12    BILATERAL BULB/PROXIMAL ICAS;MILD AMOUNT OF FIBROUS PLAQUE WITH NO DIAMETER REDUCTION.    Family History  Problem Relation Age of Onset  . Colon cancer Neg Hx   . Breast cancer Neg Hx   . Thyroid disease Neg Hx   . Coronary artery disease Son 76    deceased  . Heart attack Father   . Hypertension Brother   . Stroke Brother     Allergies  Allergen Reactions  . Hydrocodone  Itching and Nausea Only  . Tramadol Hcl Itching and Nausea Only    Current Outpatient Prescriptions on File Prior to Visit  Medication Sig Dispense Refill  . acetaminophen (TYLENOL) 500 MG tablet Take 1,000 mg by mouth every 6 (six) hours as needed for moderate pain (pain).    Marland Kitchen albuterol (PROAIR HFA) 108 (90 Base) MCG/ACT inhaler Inhale 2 puffs into the lungs every 6 (six) hours as needed for wheezing or shortness of breath. 18 g 6  . ALPRAZolam (XANAX) 0.5 MG tablet Take 1 tablet (0.5 mg total) by mouth at bedtime as needed for anxiety or sleep. 30 tablet 3  . artificial tears (LACRILUBE) OINT ophthalmic ointment Place into both eyes at bedtime as needed for dry eyes. 3.5 g 11  . aspirin 81 MG tablet Take 81 mg by mouth daily.    Marland Kitchen atorvastatin (LIPITOR) 40 MG tablet Take 1 tablet (40 mg total) by mouth at bedtime. 90 tablet 1  . azithromycin (ZITHROMAX) 250 MG tablet Take 2 tablets by mouth on day 1, followed by 1 tablet by mouth daily for 4 days. 6 tablet 0  . benazepril (LOTENSIN) 20 MG tablet TAKE 1 TABLET BY MOUTH EVERY DAY (NEED OFFICE VISIT FOR MORE REFILLS!!) 90 tablet 0  . budesonide (PULMICORT) 0.5 MG/2ML nebulizer solution Take 2 mLs (0.5 mg total) by nebulization 2 (two) times daily. 120 mL 5  . Calcium Carbonate (CALCIUM 500 PO) Take 500 mg by mouth at bedtime.     . cholecalciferol (VITAMIN D) 1000 UNITS tablet Take 1,000 Units by mouth at bedtime.     Marland Kitchen diltiazem (CARDIZEM SR) 120 MG 12 hr capsule Take 1 capsule (120 mg total) by mouth 2 (two) times daily. 30 capsule 6  . dorzolamide-timolol (COSOPT) 22.3-6.8 MG/ML ophthalmic solution Place 1 drop into both eyes 2 (two) times daily.     Marland Kitchen esomeprazole (NEXIUM) 40 MG capsule Take 1 capsule (40 mg total) by mouth daily before breakfast. 90 capsule 1  . furosemide (LASIX) 20 MG tablet Take 2 tablets (40 mg total) by mouth daily. 180 tablet 1  . glucose blood test strip 1 each by Other route as needed for other (blood sugar test).  Reported on 11/10/2015    . Ketoprofen 10 % CREA Apply twice a day to the knee as needed 120 g 2  . levalbuterol (XOPENEX) 0.63 MG/3ML nebulizer solution Take 3 mLs (0.63 mg total) by nebulization 2 (two) times daily. 180 mL 5  . magnesium oxide (MAG-OX) 400 (241.3 Mg) MG tablet Take  1 tablet (400 mg total) by mouth daily. 90 tablet 1  . methimazole (TAPAZOLE) 5 MG tablet Take 1 tablet (5 mg total) by mouth 3 (three) times a week. 40 tablet 0  . metoprolol tartrate (LOPRESSOR) 25 MG tablet Take 1 tablet (25 mg total) by mouth 3 (three) times daily. 270 tablet 1  . nitroGLYCERIN (NITROSTAT) 0.4 MG SL tablet Place 0.4 mg under the tongue every 5 (five) minutes as needed for chest pain (chest pain). Reported on 11/10/2015    . ONETOUCH DELICA LANCETS [id number] MISC Test blood sugars no more than twice daily. 100 each 12  . potassium chloride (KLOR-CON M10) 10 MEQ tablet Take 1 tablet (10 mEq total) by mouth daily. 90 tablet 1  . predniSONE (DELTASONE) 10 MG tablet 5 tab po day 1, 4 tab po day 2, 3 tab po day 3, 2 tab po day 4, 1 tab po day 5. 15 tablet 0  . sitaGLIPtin (JANUVIA) 50 MG tablet Take 1 tablet (50 mg total) by mouth daily. 90 tablet 1  . TRAVATAN Z 0.004 % SOLN ophthalmic solution Place 1 drop into both eyes at bedtime.      No current facility-administered medications on file prior to visit.    BP 124/68 mmHg  Pulse 55  Temp(Src) 98.1 F (36.7 C) (Oral)  Ht 5\' 1"  (1.549 m)  Wt 173 lb 3.2 oz (78.563 kg)  BMI 32.74 kg/m2  SpO2 98%       Objective:   Physical Exam   General Mental Status- Alert. General Appearance- Not in acute distress.   Cranium- no pain on palpation of head.  Skin General: Color- Normal Color. Moisture- Normal Moisture.  Neck  No mid cspine tenderness to palpation.  Chest and Lung Exam Auscultation: Breath Sounds:-Normal.  Cardiovascular Auscultation:Rythm- Regular. Murmurs & Other Heart Sounds:Auscultation of the heart reveals- No  Murmurs.  Abdomen Inspection:-Inspeection Normal. Palpation/Percussion:Note:No mass. Palpation and Percussion of the abdomen reveal- Non Tender, Non Distended + BS, no rebound or guarding.    Neurologic Cranial Nerve exam:- CN III-XII intact(No nystagmus), symmetric smile. strength:- 5/5 equal and symmetric strength both upper and lower extremities.  Ribs- no tenderness to palpation.  Rt shoulder- tender to palpation and reduced rom  Rt humerus-proximal humerus modrate tender.  Rt scapula- mild tender to palpation.  Rt elbow- not tender to palpation. Rt forearm- no tenderness to palpaton. Rt hand- no pain on palpation. Lower ext- no pain on palpation. Rt Hips- no pain on palpation  Lt hip pain- mild pain(but reports she had that before.)     Assessment & Plan:  For your areas of pain will get xrays today.  For pain can try tylenol otc. If not adequate then [place] advil/ibuprofen otc 200-400 mg up to 3 times daily.  Please use walker when ambulating. I can put in PT referral for gate assessment if you agree to.  Follow up in 10-14 days or as needed  We will contact you with xray results when those are in.  Leena Tiede, Percell Miller, PA-C

## 2015-12-07 NOTE — Patient Instructions (Signed)
For your areas of pain will get xrays today.  For pain can try tylenol otc. If not adequate then [place] advil/ibuprofen otc 200-400 mg up to 3 times daily.  Please use walker when ambulating. I can put in PT referral for gate assessment if you agree to.  Follow up in 10-14 days or as needed  We will contact you with xray results when those are in.

## 2015-12-07 NOTE — Addendum Note (Signed)
Addended by: Anabel Halon on: 12/07/2015 01:43 PM   Modules accepted: Orders, SmartSet

## 2015-12-07 NOTE — Progress Notes (Signed)
Pre visit review using our clinic review tool, if applicable. No additional management support is needed unless otherwise documented below in the visit note. 

## 2015-12-16 DIAGNOSIS — J4550 Severe persistent asthma, uncomplicated: Secondary | ICD-10-CM | POA: Diagnosis not present

## 2015-12-21 ENCOUNTER — Telehealth: Payer: Self-pay | Admitting: Internal Medicine

## 2015-12-21 NOTE — Telephone Encounter (Signed)
LMOM at Pt's daughter, Samina, mobile number informing her to return call at her earliest convenience.

## 2015-12-21 NOTE — Telephone Encounter (Signed)
Please check on the patient, we recently recommended a nebulizer. Did she get a nebulizer? Doing better?

## 2015-12-22 ENCOUNTER — Ambulatory Visit: Payer: Medicare Other

## 2015-12-25 NOTE — Telephone Encounter (Signed)
2 month follow-up w/ Dr. Larose Kells scheduled for 01/10/2016.

## 2015-12-26 ENCOUNTER — Telehealth: Payer: Self-pay | Admitting: Internal Medicine

## 2015-12-26 MED ORDER — PREDNISONE 10 MG PO TABS: 10.0000 mg | ORAL_TABLET | Freq: Two times a day (BID) | ORAL | Status: DC

## 2015-12-26 NOTE — Telephone Encounter (Signed)
Spoke w/ [name], informed her of recommendations. Prednisone 10 mg 2 tablet daily for 5 days sent to [facility] on [address]. [name] verbalized understanding.

## 2015-12-26 NOTE — Telephone Encounter (Signed)
Spoke w/ Pt's daughter, [name], Pt has been using nebulizer and treatments as prescribed, she has been doing "pretty good" until the last few days; she has been having a cough w/ phlegm per [name], she has not been running any fevers. [name] wanted to know if prednisone can be sent to pharmacy for Pt. Pt has f/u appt scheduled for 01/10/2016. Informed her I would send message to PCP for advice.

## 2015-12-26 NOTE — Telephone Encounter (Signed)
Send Prednisone 10 mg 2 tablets daily for 5 days. If she gets worse, has fever, chills, increased sputum production: Needs to be seen. In addition to her asthma medications, take Robitussin as needed

## 2015-12-26 NOTE — Telephone Encounter (Signed)
Pt's daughter called in because she says that her mom (pt) is having some phlym . She would like to know if PCP will call her something in to the pharmacy or will she need an appt?   CB: [phone] [name]

## 2016-01-08 DIAGNOSIS — H401131 Primary open-angle glaucoma, bilateral, mild stage: Secondary | ICD-10-CM | POA: Diagnosis not present

## 2016-01-08 DIAGNOSIS — H25041 Posterior subcapsular polar age-related cataract, right eye: Secondary | ICD-10-CM | POA: Diagnosis not present

## 2016-01-10 ENCOUNTER — Ambulatory Visit (INDEPENDENT_AMBULATORY_CARE_PROVIDER_SITE_OTHER): Payer: Medicare Other | Admitting: Internal Medicine

## 2016-01-10 ENCOUNTER — Encounter: Payer: Self-pay | Admitting: Internal Medicine

## 2016-01-10 VITALS — BP 122/82 | HR 71 | Temp 97.6°F | Resp 14 | Ht 61.0 in | Wt 170.1 lb

## 2016-01-10 DIAGNOSIS — E119 Type 2 diabetes mellitus without complications: Secondary | ICD-10-CM

## 2016-01-10 DIAGNOSIS — I10 Essential (primary) hypertension: Secondary | ICD-10-CM

## 2016-01-10 DIAGNOSIS — J4550 Severe persistent asthma, uncomplicated: Secondary | ICD-10-CM | POA: Diagnosis not present

## 2016-01-10 LAB — BASIC METABOLIC PANEL
BUN: 15 mg/dL (ref 6–23)
CALCIUM: 9.7 mg/dL (ref 8.4–10.5)
CO2: 31 mEq/L (ref 19–32)
Chloride: 104 mEq/L (ref 96–112)
Creatinine, Ser: 0.95 mg/dL (ref 0.40–1.20)
GFR: 70.91 mL/min (ref >60.00)
GLUCOSE: 150 mg/dL — AB (ref 70–99)
POTASSIUM: 4.6 meq/L (ref 3.5–5.1)
SODIUM: 146 meq/L — AB (ref 135–145)

## 2016-01-10 LAB — HEMOGLOBIN A1C: HEMOGLOBIN A1C: 6.9 % — AB (ref 4.6–6.5)

## 2016-01-10 MED ORDER — FUROSEMIDE 20 MG PO TABS: 40.0000 mg | ORAL_TABLET | Freq: Every day | ORAL | 1 refills | Status: DC

## 2016-01-10 MED ORDER — KETOCONAZOLE 2 % EX CREA: 1.0000 "application " | TOPICAL_CREAM | Freq: Every day | CUTANEOUS | 0 refills | Status: DC

## 2016-01-10 MED ORDER — SITAGLIPTIN PHOSPHATE 50 MG PO TABS: 50.0000 mg | ORAL_TABLET | Freq: Every day | ORAL | 1 refills | Status: DC

## 2016-01-10 MED ORDER — ESOMEPRAZOLE MAGNESIUM 40 MG PO CPDR: 40.0000 mg | DELAYED_RELEASE_CAPSULE | Freq: Every day | ORAL | 1 refills | Status: DC

## 2016-01-10 NOTE — Progress Notes (Signed)
Subjective:    Patient ID: Rebecca Skinner, female    DOB: [birth year], 82 y.o.   MRN: [id number]  DOS:  01/10/2016 Type of visit - description : F/U, Here with her daughter [name] Interval history: Asthma: Was doing relatively okay until recently, had more sputum-cough, no hemoptysis -- rx prednisone and  Zithromax. Feeling better. Her daughter [name] is not completely sure she is taking the nebulizations as prescribed.  Also complaining of a rash slightly itchy at the left groin. Onset 2 days ago.  Review of Systems No fever or chills No nausea, vomiting, diarrhea  Past Medical History:  Diagnosis Date  . Anxiety 11/20/2011  . Asthma    [facility] DR Mitsuye Schrodt  . CAD (coronary artery disease)    s/p CABG s/p AO valve replacement ([place] [facility])  . CVA (cerebral infarction) 08-2013   multiple, L, d/t Afib, started eloquis  . Diabetes mellitus    type 2   . Dizziness    Chronic, admiet 07-2010,saw neuro, thought to be a peripheral issue   . Dry skin   . GERD (gastroesophageal reflux disease)   . Hyperlipidemia   . Hypertension   . Hyperthyroidism   . Keloid    @ chest  . Memory loss   . Osteoarthritis   . Osteopenia    per dexa 12/09  . Paroxysmal atrial fibrillation ([place])    cards d/c coumadin 12/2008 d/t persisten NSR and frequent falls- restarted coumadin july 2011, now on Eliquis  . Recurrent UTI   . Shortness of breath dyspnea   . TIA (transient ischemic attack)     Past Surgical History:  Procedure Laterality Date  . ABDOMINAL HYSTERECTOMY    . AORTIC VALVE REPLACEMENT  1998  . cagb  1998  . CAROTID DOPPLER  07/13/12   BILATERAL BULB/PROXIMAL ICAS;MILD AMOUNT OF FIBROUS PLAQUE WITH NO DIAMETER REDUCTION.  . CESAREAN SECTION     x 2  . COLONOSCOPY WITH PROPOFOL N/A 03/30/2014   Procedure: COLONOSCOPY WITH PROPOFOL;  Surgeon: Irene Shipper, MD;  Location: [facility];  Service: Endoscopy;  Laterality: N/A;  . CORONARY ARTERY BYPASS GRAFT  1998   SVG TO RCA  .  HEMORRHOID SURGERY    . MYOCARDIAL PERFUSION STUDY  12/19/09   NORMAL PATTERN OF PERFUSION IN ALL REGIONS.EF 75%.  . OOPHORECTOMY    . TOTAL KNEE ARTHROPLASTY  1999  . TRANSTHORACIC ECHOCARDIOGRAM  09/11/11   LVEF >55%.STAGE 1 DIASTOLIC DYSFUNCTION,ELEVATED LV FILLING PRESSURE.BIOPROSTHETIC AORTIC VALVE-PEAK AND MEAN GRADIENTS OF 17 MMHG AND 8 MMHG.SIGMOID SEPTUM.MILD TO MOD MR.MILD TO MOD TR.RVSP 46 MMHG.    Social History   Social History  . Marital status: Divorced    Spouse name: N/A  . Number of children: 4  . Years of education: N/A   Occupational History  . retired  Retired   Social History Main Topics  . Smoking status: Former Research scientist (life sciences)  . Smokeless tobacco: Never Used     Comment: quit 2004, smoked 1.5 ppd  . Alcohol use No  . Drug use: No  . Sexual activity: Not on file   Other Topics Concern  . Not on file   Social History Narrative   Back living at her house since the last visit, GGson lives w/ her (80 y/o)   Had 3 daughter- 2 son  (lost oldest daughter and son), 2 living daughters in [place]        Medication List  Accurate as of 01/10/16  5:22 PM. Always use your most recent med list.          acetaminophen 500 MG tablet Commonly known as:  TYLENOL Take 1,000 mg by mouth every 6 (six) hours as needed for moderate pain (pain).   albuterol 108 (90 Base) MCG/ACT inhaler Commonly known as:  PROAIR HFA Inhale 2 puffs into the lungs every 6 (six) hours as needed for wheezing or shortness of breath.   ALPRAZolam 0.5 MG tablet Commonly known as:  XANAX Take 1 tablet (0.5 mg total) by mouth at bedtime as needed for anxiety or sleep.   artificial tears Oint ophthalmic ointment Place into both eyes at bedtime as needed for dry eyes.   aspirin 81 MG tablet Take 81 mg by mouth daily.   atorvastatin 40 MG tablet Commonly known as:  LIPITOR Take 1 tablet (40 mg total) by mouth at bedtime.   benazepril 20 MG tablet Commonly known as:  LOTENSIN TAKE  1 TABLET BY MOUTH EVERY DAY (NEED OFFICE VISIT FOR MORE REFILLS!!)   budesonide 0.5 MG/2ML nebulizer solution Commonly known as:  PULMICORT Take 2 mLs (0.5 mg total) by nebulization 2 (two) times daily.   CALCIUM 500 PO Take 500 mg by mouth at bedtime.   cholecalciferol 1000 units tablet Commonly known as:  VITAMIN D Take 1,000 Units by mouth at bedtime.   diltiazem 120 MG 12 hr capsule Commonly known as:  CARDIZEM SR Take 1 capsule (120 mg total) by mouth 2 (two) times daily.   dorzolamide-timolol 22.3-6.8 MG/ML ophthalmic solution Commonly known as:  COSOPT Place 1 drop into both eyes 2 (two) times daily.   esomeprazole 40 MG capsule Commonly known as:  NEXIUM Take 1 capsule (40 mg total) by mouth daily before breakfast.   furosemide 20 MG tablet Commonly known as:  LASIX Take 2 tablets (40 mg total) by mouth daily.   glucose blood test strip 1 each by Other route as needed for other (blood sugar test). Reported on 11/10/2015   ketoconazole 2 % cream Commonly known as:  NIZORAL Apply 1 application topically daily.   Ketoprofen 10 % Crea Apply twice a day to the knee as needed   levalbuterol 0.63 MG/3ML nebulizer solution Commonly known as:  XOPENEX Take 3 mLs (0.63 mg total) by nebulization 2 (two) times daily.   magnesium oxide 400 (241.3 Mg) MG tablet Commonly known as:  MAG-OX Take 1 tablet (400 mg total) by mouth daily.   methimazole 5 MG tablet Commonly known as:  TAPAZOLE Take 1 tablet (5 mg total) by mouth 3 (three) times a week.   metoprolol tartrate 25 MG tablet Commonly known as:  LOPRESSOR Take 1 tablet (25 mg total) by mouth 3 (three) times daily.   nitroGLYCERIN 0.4 MG SL tablet Commonly known as:  NITROSTAT Place 0.4 mg under the tongue every 5 (five) minutes as needed for chest pain (chest pain). Reported on [id number]   [facility] DELICA LANCETS [id number] Misc Test blood sugars no more than twice daily.   potassium chloride 10 MEQ tablet Commonly  known as:  KLOR-CON M10 Take 1 tablet (10 mEq total) by mouth daily.   sitaGLIPtin 50 MG tablet Commonly known as:  JANUVIA Take 1 tablet (50 mg total) by mouth daily.   TRAVATAN Z 0.004 % Soln ophthalmic solution Generic drug:  Travoprost (BAK Free) Place 1 drop into both eyes at bedtime.          Objective:   Physical  Exam BP 122/82 (BP Location: Left Arm, Patient Position: Sitting, Cuff Size: Small)   Pulse 71   Temp 97.6 F (36.4 C) (Oral)   Resp 14   Ht 5\' 1"  (1.549 m)   Wt 170 lb 2 oz (77.2 kg)   SpO2 98%   BMI 32.14 kg/m  General:   Well developed, elderly lady in no distress, sitting in a wheelchair. Frail appearing.Marland Kitchen  HEENT:  Normocephalic . Face symmetric, atraumatic Lungs:  No rhonchi, few end expiratory wheezes. Normal respiratory effort, no intercostal retractions, no accessory muscle use. Heart: Irregular, soft murmur. No pretibial edema bilaterally  Skin: Minimal redness at the right groin folds. Not warm or TTP Neurologic:  gait not tested Psych--  Behavior appropriate. No anxious or depressed appearing.      Assessment & Plan:   Assessment DM HTN Hyperlipidemia Hyperthyroidism  Dr. Loanne Drilling GERD Asthma  Anxiety  On xanax  CV: Dr. Claiborne Billings --CHF --CAD, CABG --Aortic valve replacement --Paroxysmal atrial fibrillation, used to be on Coumadin,   Eliquis rx after the stroke 3-15, d/c 03-2014 d/t GI bleed , on ASA 81 --TIAs, CVA 2015 DJD Osteopenia 2009 Memory deficits  Recurrent UTIs Daughter  Jasmine Awe  PLAN DM: Continue present care, check A1c, refill medicines HTN: Controlled, check a BMP, continue present care Asthma: She is actually better than the last time I saw her, I emphasized the need to take steroid nebulizations twice a day regardless of symptoms and Xopenex twice a day and as needed. Will ask RT to visit the patient to be sure of compliance ([facility] unable to send a RT to her home, will try other agencies) Rash, groin,  fungal?: Nizoral twice a day for one week, keep the area clean and dry. RTC 6 weeks

## 2016-01-10 NOTE — Assessment & Plan Note (Signed)
DM: Continue present care, check A1c, refill medicines HTN: Controlled, check a BMP, continue present care Asthma: She is actually better than the last time I saw her, I emphasized the need to take steroid nebulizations twice a day regardless of symptoms and Xopenex twice a day and as needed. Will ask RT to visit the patient to be sure of compliance ([facility] unable to send a RT to her home, will try other agencies) Rash, groin, fungal?: Nizoral twice a day for one week, keep the area clean and dry. RTC 6 weeks

## 2016-01-10 NOTE — Patient Instructions (Addendum)
GO TO THE LAB : Get the blood work     GO TO THE FRONT DESK Schedule your next appointment for a  Check up in 6 weeks    TAKE ALL MEDS AS PRESCRIBED  NEBULIZATIONS:  BUDESONIDE  ONE NEBULIZATION MORNING AND AFTERNOON EVERY DAY LEVALBUTEROL  ONE [place] . IF SHE IS CONGESTED OK TO GIVE A EXTRA NEBULIZATION MID MORNING AND AFTERNOON

## 2016-01-10 NOTE — Progress Notes (Signed)
Pre visit review using our clinic review tool, if applicable. No additional management support is needed unless otherwise documented below in the visit note. 

## 2016-01-16 DIAGNOSIS — J4550 Severe persistent asthma, uncomplicated: Secondary | ICD-10-CM | POA: Diagnosis not present

## 2016-01-22 ENCOUNTER — Ambulatory Visit: Payer: Medicare Other | Admitting: Endocrinology

## 2016-02-02 ENCOUNTER — Ambulatory Visit (INDEPENDENT_AMBULATORY_CARE_PROVIDER_SITE_OTHER): Payer: Medicare Other | Admitting: Endocrinology

## 2016-02-02 ENCOUNTER — Encounter: Payer: Self-pay | Admitting: Endocrinology

## 2016-02-02 VITALS — BP 136/80 | HR 64 | Ht 61.0 in | Wt 175.0 lb

## 2016-02-02 DIAGNOSIS — E0520 Thyrotoxicosis with toxic multinodular goiter without thyrotoxic crisis or storm: Secondary | ICD-10-CM

## 2016-02-02 NOTE — Patient Instructions (Addendum)
blood tests are requested for you today.  We'll let you know about the results.  if ever you have fever while taking methimazole, stop it and call us, because of the risk of a rare side-effect.   Please as Dr Larose Kells to check your thyroid blood test approximately twice a year. I would be happy to see you back here as needed.

## 2016-02-02 NOTE — Progress Notes (Signed)
Subjective:    Patient ID: Rebecca Skinner, female    DOB: 22-Nov-1922, 82 y.o.   MRN: [id number]  HPI pt returns for f/u of hyperthyroidism (due to multinodular goiter; dx'ed 2008; tapazole was chosen as initial rx, due to AF; due to increasing TSH, this was reduced, then stopped altogether in 2011; also in 2011, US showed small multinodular goiter; in early 2012, hyperthyroidism recurred; the plan was for scanning, then i-131 rx, but a repeat TSH was normal. the TSH did not go low again until mid-2014; she chose to resume tapazole; in October of 2014, tapazole was reduced, due to normalization of her TFT). she takes tapazole as rx'ed. She denies tremor.  pt states she feels well in general.   Past Medical History:  Diagnosis Date  . Anxiety 11/20/2011  . Asthma    [facility] DR PAZ  . CAD (coronary artery disease)    s/p CABG s/p AO valve replacement ([place] [facility])  . CVA (cerebral infarction) 08-2013   multiple, L, d/t Afib, started eloquis  . Diabetes mellitus    type 2   . Dizziness    Chronic, admiet 07-2010,saw neuro, thought to be a peripheral issue   . Dry skin   . GERD (gastroesophageal reflux disease)   . Hyperlipidemia   . Hypertension   . Hyperthyroidism   . Keloid    @ chest  . Memory loss   . Osteoarthritis   . Osteopenia    per dexa 12/09  . Paroxysmal atrial fibrillation ([place])    cards d/c coumadin 12/2008 d/t persisten NSR and frequent falls- restarted coumadin july 2011, now on Eliquis  . Recurrent UTI   . Shortness of breath dyspnea   . TIA (transient ischemic attack)     Past Surgical History:  Procedure Laterality Date  . ABDOMINAL HYSTERECTOMY    . AORTIC VALVE REPLACEMENT  1998  . cagb  1998  . CAROTID DOPPLER  07/13/12   BILATERAL BULB/PROXIMAL ICAS;MILD AMOUNT OF FIBROUS PLAQUE WITH NO DIAMETER REDUCTION.  . CESAREAN SECTION     x 2  . COLONOSCOPY WITH PROPOFOL N/A 03/30/2014   Procedure: COLONOSCOPY WITH PROPOFOL;  Surgeon: Irene Shipper, MD;   Location: [facility];  Service: Endoscopy;  Laterality: N/A;  . CORONARY ARTERY BYPASS GRAFT  1998   SVG TO RCA  . HEMORRHOID SURGERY    . MYOCARDIAL PERFUSION STUDY  12/19/09   NORMAL PATTERN OF PERFUSION IN ALL REGIONS.EF 75%.  . OOPHORECTOMY    . TOTAL KNEE ARTHROPLASTY  1999  . TRANSTHORACIC ECHOCARDIOGRAM  09/11/11   LVEF >55%.STAGE 1 DIASTOLIC DYSFUNCTION,ELEVATED LV FILLING PRESSURE.BIOPROSTHETIC AORTIC VALVE-PEAK AND MEAN GRADIENTS OF 17 MMHG AND 8 MMHG.SIGMOID SEPTUM.MILD TO MOD MR.MILD TO MOD TR.RVSP 46 MMHG.    Social History   Social History  . Marital status: Divorced    Spouse name: N/A  . Number of children: 4  . Years of education: N/A   Occupational History  . retired  Retired   Social History Main Topics  . Smoking status: Former Research scientist (life sciences)  . Smokeless tobacco: Never Used     Comment: quit 2004, smoked 1.5 ppd  . Alcohol use No  . Drug use: No  . Sexual activity: Not on file   Other Topics Concern  . Not on file   Social History Narrative   Back living at her house since the last visit, [place] lives w/ her (82 y/o)   Had 3 daughter- 2  son  (lost oldest daughter and son), 2 living daughters in [place]    Current Outpatient Prescriptions on File Prior to Visit  Medication Sig Dispense Refill  . acetaminophen (TYLENOL) 500 MG tablet Take 1,000 mg by mouth every 6 (six) hours as needed for moderate pain (pain).    Marland Kitchen albuterol (PROAIR HFA) 108 (90 Base) MCG/ACT inhaler Inhale 2 puffs into the lungs every 6 (six) hours as needed for wheezing or shortness of breath. 18 g 6  . ALPRAZolam (XANAX) 0.5 MG tablet Take 1 tablet (0.5 mg total) by mouth at bedtime as needed for anxiety or sleep. 30 tablet 3  . artificial tears (LACRILUBE) OINT ophthalmic ointment Place into both eyes at bedtime as needed for dry eyes. 3.5 g 11  . aspirin 81 MG tablet Take 81 mg by mouth daily.    Marland Kitchen atorvastatin (LIPITOR) 40 MG tablet Take 1 tablet (40 mg total) by mouth at bedtime. 90  tablet 1  . benazepril (LOTENSIN) 20 MG tablet TAKE 1 TABLET BY MOUTH EVERY DAY (NEED OFFICE VISIT FOR MORE REFILLS!!) 90 tablet 0  . budesonide (PULMICORT) 0.5 MG/2ML nebulizer solution Take 2 mLs (0.5 mg total) by nebulization 2 (two) times daily. 120 mL 5  . Calcium Carbonate (CALCIUM 500 PO) Take 500 mg by mouth at bedtime.     . cholecalciferol (VITAMIN D) 1000 UNITS tablet Take 1,000 Units by mouth at bedtime.     Marland Kitchen diltiazem (CARDIZEM SR) 120 MG 12 hr capsule Take 1 capsule (120 mg total) by mouth 2 (two) times daily. 30 capsule 6  . dorzolamide-timolol (COSOPT) 22.3-6.8 MG/ML ophthalmic solution Place 1 drop into both eyes 2 (two) times daily.     Marland Kitchen esomeprazole (NEXIUM) 40 MG capsule Take 1 capsule (40 mg total) by mouth daily before breakfast. 90 capsule 1  . furosemide (LASIX) 20 MG tablet Take 2 tablets (40 mg total) by mouth daily. 180 tablet 1  . glucose blood test strip 1 each by Other route as needed for other (blood sugar test). Reported on 11/10/2015    . ketoconazole (NIZORAL) 2 % cream Apply 1 application topically daily. 30 g 0  . Ketoprofen 10 % CREA Apply twice a day to the knee as needed 120 g 2  . levalbuterol (XOPENEX) 0.63 MG/3ML nebulizer solution Take 3 mLs (0.63 mg total) by nebulization 2 (two) times daily. 180 mL 5  . magnesium oxide (MAG-OX) 400 (241.3 Mg) MG tablet Take 1 tablet (400 mg total) by mouth daily. 90 tablet 1  . methimazole (TAPAZOLE) 5 MG tablet Take 1 tablet (5 mg total) by mouth 3 (three) times a week. 40 tablet 0  . metoprolol tartrate (LOPRESSOR) 25 MG tablet Take 1 tablet (25 mg total) by mouth 3 (three) times daily. 270 tablet 1  . nitroGLYCERIN (NITROSTAT) 0.4 MG SL tablet Place 0.4 mg under the tongue every 5 (five) minutes as needed for chest pain (chest pain). Reported on 11/10/2015    . ONETOUCH DELICA LANCETS 99991111 MISC Test blood sugars no more than twice daily. (Patient not taking: Reported on 01/10/2016) 100 each 12  . potassium chloride  (KLOR-CON M10) 10 MEQ tablet Take 1 tablet (10 mEq total) by mouth daily. 90 tablet 1  . sitaGLIPtin (JANUVIA) 50 MG tablet Take 1 tablet (50 mg total) by mouth daily. 90 tablet 1  . TRAVATAN Z 0.004 % SOLN ophthalmic solution Place 1 drop into both eyes at bedtime.      No current facility-administered  medications on file prior to visit.     Allergies  Allergen Reactions  . Hydrocodone Itching and Nausea Only  . Tramadol Hcl Itching and Nausea Only    Family History  Problem Relation Age of Onset  . Heart attack Father   . Coronary artery disease Son 84    deceased  . Hypertension Brother   . Stroke Brother   . Colon cancer Neg Hx   . Breast cancer Neg Hx   . Thyroid disease Neg Hx     BP 136/80   Pulse 64   Ht 5\' 1"  (1.549 m)   Wt 175 lb (79.4 kg)   BMI 33.07 kg/m    Review of Systems Denies fever    Objective:   Physical Exam VITAL SIGNS:  See vs page GENERAL: no distress NECK: small multinodular goiter.  NEURO: no tremor Skin: not diaphoretic.      Assessment & Plan:  Hyperthyroidism: due for recheck.

## 2016-02-16 DIAGNOSIS — J4550 Severe persistent asthma, uncomplicated: Secondary | ICD-10-CM | POA: Diagnosis not present

## 2016-02-20 ENCOUNTER — Ambulatory Visit (HOSPITAL_BASED_OUTPATIENT_CLINIC_OR_DEPARTMENT_OTHER)
Admission: RE | Admit: 2016-02-20 | Discharge: 2016-02-20 | Disposition: A | Discharge status: Home / Self Care | Payer: Medicare Other | Source: Ambulatory Visit | Attending: Internal Medicine | Admitting: Internal Medicine

## 2016-02-20 ENCOUNTER — Encounter: Payer: Self-pay | Admitting: Internal Medicine

## 2016-02-20 ENCOUNTER — Ambulatory Visit (INDEPENDENT_AMBULATORY_CARE_PROVIDER_SITE_OTHER): Payer: Medicare Other | Admitting: Internal Medicine

## 2016-02-20 VITALS — BP 126/66 | HR 50 | Temp 98.3°F | Resp 14 | Ht 61.0 in | Wt 171.4 lb

## 2016-02-20 DIAGNOSIS — E119 Type 2 diabetes mellitus without complications: Secondary | ICD-10-CM

## 2016-02-20 DIAGNOSIS — J4540 Moderate persistent asthma, uncomplicated: Secondary | ICD-10-CM

## 2016-02-20 DIAGNOSIS — M25551 Pain in right hip: Secondary | ICD-10-CM | POA: Insufficient documentation

## 2016-02-20 DIAGNOSIS — S79911A Unspecified injury of right hip, initial encounter: Secondary | ICD-10-CM | POA: Diagnosis not present

## 2016-02-20 DIAGNOSIS — Z23 Encounter for immunization: Secondary | ICD-10-CM

## 2016-02-20 DIAGNOSIS — E0590 Thyrotoxicosis, unspecified without thyrotoxic crisis or storm: Secondary | ICD-10-CM

## 2016-02-20 LAB — T3, FREE: T3, Free: 3.2 pg/mL (ref 2.3–4.2)

## 2016-02-20 LAB — TSH: TSH: 1.28 u[IU]/mL (ref 0.35–4.50)

## 2016-02-20 LAB — T4, FREE: Free T4: 0.84 ng/dL (ref 0.60–1.60)

## 2016-02-20 NOTE — Patient Instructions (Addendum)
GO TO THE LAB : Get the blood work     GO TO THE FRONT DESK Schedule your next appointment for a  yearly checkup in 3 months  STOP BY THE FIRST FLOOR:  get the XR    Tylenol  500 mg OTC 2 tabs a day every 8 hours as needed for pain   Fall Prevention in the Home  Falls can cause injuries and can affect people from all age groups. There are many simple things that you can do to make your home safe and to help prevent falls. WHAT CAN I DO ON THE OUTSIDE OF MY HOME?  Regularly repair the edges of walkways and driveways and fix any cracks.  Remove high doorway thresholds.  Trim any shrubbery on the main path into your home.  Use bright outdoor lighting.  Clear walkways of debris and clutter, including tools and rocks.  Regularly check that handrails are securely fastened and in good repair. Both sides of any steps should have handrails.  Install guardrails along the edges of any raised decks or porches.  Have leaves, snow, and ice cleared regularly.  Use sand or salt on walkways during winter months.  In the garage, clean up any spills right away, including grease or oil spills. WHAT CAN I DO IN THE BATHROOM?  Use night lights.  Install grab bars by the toilet and in the tub and shower. Do not use towel bars as grab bars.  Use non-skid mats or decals on the floor of the tub or shower.  If you need to sit down while you are in the shower, use a plastic, non-slip stool.Marland Kitchen  Keep the floor dry. Immediately clean up any water that spills on the floor.  Remove soap buildup in the tub or shower on a regular basis.  Attach bath mats securely with double-sided non-slip rug tape.  Remove throw rugs and other tripping hazards from the floor. WHAT CAN I DO IN THE BEDROOM?  Use night lights.  Make sure that a bedside light is easy to reach.  Do not use oversized bedding that drapes onto the floor.  Have a firm chair that has side arms to use for getting dressed.  Remove  throw rugs and other tripping hazards from the floor. WHAT CAN I DO IN THE KITCHEN?   Clean up any spills right away.  Avoid walking on wet floors.  Place frequently used items in easy-to-reach places.  If you need to reach for something above you, use a sturdy step stool that has a grab bar.  Keep electrical cables out of the way.  Do not use floor polish or wax that makes floors slippery. If you have to use wax, make sure that it is non-skid floor wax.  Remove throw rugs and other tripping hazards from the floor. WHAT CAN I DO IN THE STAIRWAYS?  Do not leave any items on the stairs.  Make sure that there are handrails on both sides of the stairs. Fix handrails that are broken or loose. Make sure that handrails are as long as the stairways.  Check any carpeting to make sure that it is firmly attached to the stairs. Fix any carpet that is loose or worn.  Avoid having throw rugs at the top or bottom of stairways, or secure the rugs with carpet tape to prevent them from moving.  Make sure that you have a light switch at the top of the stairs and the bottom of the stairs. If you  do not have them, have them installed. WHAT ARE SOME OTHER FALL PREVENTION TIPS?  Wear closed-toe shoes that fit well and support your feet. Wear shoes that have rubber soles or low heels.  When you use a stepladder, make sure that it is completely opened and that the sides are firmly locked. Have someone hold the ladder while you are using it. Do not climb a closed stepladder.  Add color or contrast paint or tape to grab bars and handrails in your home. Place contrasting color strips on the first and last steps.  Use mobility aids as needed, such as canes, walkers, scooters, and crutches.  Turn on lights if it is dark. Replace any light bulbs that burn out.  Set up furniture so that there are clear paths. Keep the furniture in the same spot.  Fix any uneven floor surfaces.  Choose a carpet design that  does not hide the edge of steps of a stairway.  Be aware of any and all pets.  Review your medicines with your healthcare provider. Some medicines can cause dizziness or changes in blood pressure, which increase your risk of falling. Talk with your health care provider about other ways that you can decrease your risk of falls. This may include working with a physical therapist or trainer to improve your strength, balance, and endurance.   This information is not intended to replace advice given to you by your health care provider. Make sure you discuss any questions you have with your health care provider.   Document Released: 05/17/2002 Document Revised: 10/11/2014 Document Reviewed: 07/01/2014 Elsevier Interactive Patient Education [employer].

## 2016-02-20 NOTE — Progress Notes (Signed)
Pre visit review using our clinic review tool, if applicable. No additional management support is needed unless otherwise documented below in the visit note. 

## 2016-02-20 NOTE — Progress Notes (Signed)
Subjective:    Patient ID: Rebecca Skinner, female    DOB: [birth year], 82 y.o.   MRN: [id number]  DOS:  02/20/2016 Type of visit - description : Routine office visit Interval history: Here with her daughter, no major concerns except pain at the right buttock. Is on and off, sometimes at rest, sometimes when she walks, no radiation, no mid lower back pain. When asked, admits to a few falls, no major injuries, she usually is able to slid  down . Labs reviewed: Last A1c and BMP satisfactory Asthma: Good medication compliance, not a major problem at this point   Review of Systems Denies feeling dizzy or particularly tired today (has a pulse of 40)  Past Medical History:  Diagnosis Date  . Anxiety 11/20/2011  . Asthma    [facility] DR Yossi Hinchman  . CAD (coronary artery disease)    s/p CABG s/p AO valve replacement ([place] [facility])  . CVA (cerebral infarction) 08-2013   multiple, L, d/t Afib, started eloquis  . Diabetes mellitus    type 2   . Dizziness    Chronic, admiet 07-2010,saw neuro, thought to be a peripheral issue   . Dry skin   . GERD (gastroesophageal reflux disease)   . Hyperlipidemia   . Hypertension   . Hyperthyroidism   . Keloid    @ chest  . Memory loss   . Osteoarthritis   . Osteopenia    per dexa 12/09  . Paroxysmal atrial fibrillation ([place])    cards d/c coumadin 12/2008 d/t persisten NSR and frequent falls- restarted coumadin july 2011, now on Eliquis  . Recurrent UTI   . Shortness of breath dyspnea   . TIA (transient ischemic attack)     Past Surgical History:  Procedure Laterality Date  . ABDOMINAL HYSTERECTOMY    . AORTIC VALVE REPLACEMENT  1998  . cagb  1998  . CAROTID DOPPLER  07/13/12   BILATERAL BULB/PROXIMAL ICAS;MILD AMOUNT OF FIBROUS PLAQUE WITH NO DIAMETER REDUCTION.  . CESAREAN SECTION     x 2  . COLONOSCOPY WITH PROPOFOL N/A 03/30/2014   Procedure: COLONOSCOPY WITH PROPOFOL;  Surgeon: Irene Shipper, MD;  Location: [facility];  Service:  Endoscopy;  Laterality: N/A;  . CORONARY ARTERY BYPASS GRAFT  1998   SVG TO RCA  . HEMORRHOID SURGERY    . MYOCARDIAL PERFUSION STUDY  12/19/09   NORMAL PATTERN OF PERFUSION IN ALL REGIONS.EF 75%.  . OOPHORECTOMY    . TOTAL KNEE ARTHROPLASTY  1999  . TRANSTHORACIC ECHOCARDIOGRAM  09/11/11   LVEF >55%.STAGE 1 DIASTOLIC DYSFUNCTION,ELEVATED LV FILLING PRESSURE.BIOPROSTHETIC AORTIC VALVE-PEAK AND MEAN GRADIENTS OF 17 MMHG AND 8 MMHG.SIGMOID SEPTUM.MILD TO MOD MR.MILD TO MOD TR.RVSP 46 MMHG.    Social History   Social History  . Marital status: Divorced    Spouse name: N/A  . Number of children: 4  . Years of education: N/A   Occupational History  . retired  Retired   Social History Main Topics  . Smoking status: Former Research scientist (life sciences)  . Smokeless tobacco: Never Used     Comment: quit 2004, smoked 1.5 ppd  . Alcohol use No  . Drug use: No  . Sexual activity: Not on file   Other Topics Concern  . Not on file   Social History Narrative   Back living at her house since the last visit, GGson lives w/ her (80 y/o)   Had 3 daughter- 2 son  (lost oldest daughter and  son), 2 living daughters in [place]        Medication List       Accurate as of 02/20/16 11:17 AM. Always use your most recent med list.          acetaminophen 500 MG tablet Commonly known as:  TYLENOL Take 1,000 mg by mouth every 6 (six) hours as needed for moderate pain (pain).   albuterol 108 (90 Base) MCG/ACT inhaler Commonly known as:  PROAIR HFA Inhale 2 puffs into the lungs every 6 (six) hours as needed for wheezing or shortness of breath.   ALPRAZolam 0.5 MG tablet Commonly known as:  XANAX Take 1 tablet (0.5 mg total) by mouth at bedtime as needed for anxiety or sleep.   artificial tears Oint ophthalmic ointment Place into both eyes at bedtime as needed for dry eyes.   aspirin 81 MG tablet Take 81 mg by mouth daily.   atorvastatin 40 MG tablet Commonly known as:  LIPITOR Take 1 tablet (40 mg  total) by mouth at bedtime.   benazepril 20 MG tablet Commonly known as:  LOTENSIN TAKE 1 TABLET BY MOUTH EVERY DAY (NEED OFFICE VISIT FOR MORE REFILLS!!)   budesonide 0.5 MG/2ML nebulizer solution Commonly known as:  PULMICORT Take 2 mLs (0.5 mg total) by nebulization 2 (two) times daily.   CALCIUM 500 PO Take 500 mg by mouth at bedtime.   cholecalciferol 1000 units tablet Commonly known as:  VITAMIN D Take 1,000 Units by mouth at bedtime.   diltiazem 120 MG 12 hr capsule Commonly known as:  CARDIZEM SR Take 1 capsule (120 mg total) by mouth 2 (two) times daily.   dorzolamide-timolol 22.3-6.8 MG/ML ophthalmic solution Commonly known as:  COSOPT Place 1 drop into both eyes 2 (two) times daily.   esomeprazole 40 MG capsule Commonly known as:  NEXIUM Take 1 capsule (40 mg total) by mouth daily before breakfast.   furosemide 20 MG tablet Commonly known as:  LASIX Take 2 tablets (40 mg total) by mouth daily.   glucose blood test strip 1 each by Other route as needed for other (blood sugar test). Reported on 11/10/2015   ketoconazole 2 % cream Commonly known as:  NIZORAL Apply 1 application topically daily.   Ketoprofen 10 % Crea Apply twice a day to the knee as needed   levalbuterol 0.63 MG/3ML nebulizer solution Commonly known as:  XOPENEX Take 3 mLs (0.63 mg total) by nebulization 2 (two) times daily.   magnesium oxide 400 (241.3 Mg) MG tablet Commonly known as:  MAG-OX Take 1 tablet (400 mg total) by mouth daily.   methimazole 5 MG tablet Commonly known as:  TAPAZOLE Take 1 tablet (5 mg total) by mouth 3 (three) times a week.   metoprolol tartrate 25 MG tablet Commonly known as:  LOPRESSOR Take 1 tablet (25 mg total) by mouth 3 (three) times daily.   nitroGLYCERIN 0.4 MG SL tablet Commonly known as:  NITROSTAT Place 0.4 mg under the tongue every 5 (five) minutes as needed for chest pain (chest pain). Reported on 123XX123   [facility] DELICA LANCETS [id number]  Misc Test blood sugars no more than twice daily.   potassium chloride 10 MEQ tablet Commonly known as:  KLOR-CON M10 Take 1 tablet (10 mEq total) by mouth daily.   sitaGLIPtin 50 MG tablet Commonly known as:  JANUVIA Take 1 tablet (50 mg total) by mouth daily.   TRAVATAN Z 0.004 % Soln ophthalmic solution Generic drug:  Travoprost (BAK Free) Place  1 drop into both eyes at bedtime.          Objective:   Physical Exam BP 126/66 (BP Location: Left Arm, Patient Position: Sitting, Cuff Size: Normal)   Pulse (!) 40   Temp 98.3 F (36.8 C) (Oral)   Resp 14   Ht 5\' 1"  (1.549 m)   Wt 171 lb 6 oz (77.7 kg)   SpO2 97%   BMI 32.38 kg/m  General:   Well developed, well nourished . NAD.  HEENT:  Normocephalic . Face symmetric, atraumatic Lungs:  Decreased breath sounds otherwise clear Normal respiratory effort, no intercostal retractions, no accessory muscle use. Heart: Irregular, bradycardic ,  no murmur.  No pretibial edema bilaterally   MSK: Left hip rotation limited but normal. Right hip rotation: Limited, elicited pain. + Pain with flexion of the leg over the abdomen. Slightly TTP at the buttock. No TTP at either trochanteric bursa Skin: Not pale. Not jaundice Neurologic:  alert & oriented X3.  Speech normal, gait assisted by a walker, hesitant, difficulty due to DJD.  Psych--  Cognition and judgment appear intact.  Cooperative with normal attention span and concentration.  Behavior appropriate. No anxious or depressed appearing.      Assessment & Plan:   Assessment DM HTN Hyperlipidemia Hyperthyroidism  Dr. Loanne Drilling GERD Asthma  Anxiety  On xanax  CV: Dr. Claiborne Billings --CHF --CAD, CABG --Aortic valve replacement --Paroxysmal atrial fibrillation, used to be on Coumadin,   Eliquis rx after the stroke 3-15, d/c 03-2014 d/t GI bleed , on ASA 81 --TIAs, CVA 2015 DJD Osteopenia 2009 Memory deficits  Recurrent UTIs Daughter  Jasmine Awe  PLAN  DM: Last A1c 6.9,  continue Lotensin, Januvia. Asthma:  controlled, continue nebulizations. DJD: Pain at the right hip likely DJD, has not  taken any pain medication. Recommend Tylenol, x-ray and observation. Had some falls, prevention discussed P-atrial fibrillation: On Cardizem, beta blockers. Pulse in her 31s when she arrived, recheck 50. Denies sx today. No change Thyroid disease: Check TFTs and forward to endocrinology Primary care: Flu shot today  RTC yearly in 3 months

## 2016-02-21 NOTE — Assessment & Plan Note (Signed)
DM: Last A1c 6.9, continue Lotensin, Januvia. Asthma:  controlled, continue nebulizations. DJD: Pain at the right hip likely DJD, has not  taken any pain medication. Recommend Tylenol, x-ray and observation. Had some falls, prevention discussed P-atrial fibrillation: On Cardizem, beta blockers. Pulse in her 27s when she arrived, recheck 50. Denies sx today. No change Thyroid disease: Check TFTs and forward to endocrinology Primary care: Flu shot today  RTC yearly in 3 months

## 2016-02-27 ENCOUNTER — Other Ambulatory Visit: Payer: Self-pay | Admitting: *Deleted

## 2016-02-28 ENCOUNTER — Other Ambulatory Visit: Payer: Self-pay

## 2016-02-28 MED ORDER — BENAZEPRIL HCL 20 MG PO TABS: ORAL_TABLET | ORAL | 0 refills | Status: DC

## 2016-03-05 ENCOUNTER — Emergency Department (HOSPITAL_COMMUNITY): Payer: Medicare Other

## 2016-03-05 ENCOUNTER — Observation Stay (HOSPITAL_COMMUNITY): Payer: Medicare Other

## 2016-03-05 ENCOUNTER — Observation Stay (HOSPITAL_COMMUNITY)
Admission: EM | Admit: 2016-03-05 | Discharge: 2016-03-06 | Disposition: A | Discharge status: Home / Self Care | Payer: Medicare Other | Attending: Internal Medicine | Admitting: Internal Medicine

## 2016-03-05 ENCOUNTER — Encounter (HOSPITAL_COMMUNITY): Payer: Self-pay

## 2016-03-05 DIAGNOSIS — R0602 Shortness of breath: Secondary | ICD-10-CM

## 2016-03-05 DIAGNOSIS — R4182 Altered mental status, unspecified: Secondary | ICD-10-CM | POA: Diagnosis not present

## 2016-03-05 DIAGNOSIS — E119 Type 2 diabetes mellitus without complications: Secondary | ICD-10-CM | POA: Diagnosis present

## 2016-03-05 DIAGNOSIS — N179 Acute kidney failure, unspecified: Secondary | ICD-10-CM | POA: Diagnosis not present

## 2016-03-05 DIAGNOSIS — Z886 Allergy status to analgesic agent status: Secondary | ICD-10-CM | POA: Diagnosis not present

## 2016-03-05 DIAGNOSIS — J45909 Unspecified asthma, uncomplicated: Secondary | ICD-10-CM | POA: Diagnosis not present

## 2016-03-05 DIAGNOSIS — M1990 Unspecified osteoarthritis, unspecified site: Secondary | ICD-10-CM | POA: Insufficient documentation

## 2016-03-05 DIAGNOSIS — Z87891 Personal history of nicotine dependence: Secondary | ICD-10-CM | POA: Insufficient documentation

## 2016-03-05 DIAGNOSIS — I482 Chronic atrial fibrillation: Secondary | ICD-10-CM | POA: Diagnosis not present

## 2016-03-05 DIAGNOSIS — I10 Essential (primary) hypertension: Secondary | ICD-10-CM

## 2016-03-05 DIAGNOSIS — R4781 Slurred speech: Secondary | ICD-10-CM | POA: Diagnosis not present

## 2016-03-05 DIAGNOSIS — I25810 Atherosclerosis of coronary artery bypass graft(s) without angina pectoris: Secondary | ICD-10-CM | POA: Insufficient documentation

## 2016-03-05 DIAGNOSIS — Z951 Presence of aortocoronary bypass graft: Secondary | ICD-10-CM | POA: Insufficient documentation

## 2016-03-05 DIAGNOSIS — M47812 Spondylosis without myelopathy or radiculopathy, cervical region: Secondary | ICD-10-CM | POA: Insufficient documentation

## 2016-03-05 DIAGNOSIS — Z9889 Other specified postprocedural states: Secondary | ICD-10-CM | POA: Insufficient documentation

## 2016-03-05 DIAGNOSIS — Z8673 Personal history of transient ischemic attack (TIA), and cerebral infarction without residual deficits: Secondary | ICD-10-CM | POA: Insufficient documentation

## 2016-03-05 DIAGNOSIS — M8580 Other specified disorders of bone density and structure, unspecified site: Secondary | ICD-10-CM | POA: Insufficient documentation

## 2016-03-05 DIAGNOSIS — Z87440 Personal history of urinary (tract) infections: Secondary | ICD-10-CM | POA: Diagnosis not present

## 2016-03-05 DIAGNOSIS — F419 Anxiety disorder, unspecified: Secondary | ICD-10-CM | POA: Diagnosis not present

## 2016-03-05 DIAGNOSIS — G9340 Encephalopathy, unspecified: Principal | ICD-10-CM

## 2016-03-05 DIAGNOSIS — Z952 Presence of prosthetic heart valve: Secondary | ICD-10-CM | POA: Insufficient documentation

## 2016-03-05 DIAGNOSIS — E1159 Type 2 diabetes mellitus with other circulatory complications: Secondary | ICD-10-CM | POA: Diagnosis not present

## 2016-03-05 DIAGNOSIS — E785 Hyperlipidemia, unspecified: Secondary | ICD-10-CM | POA: Diagnosis not present

## 2016-03-05 DIAGNOSIS — E0590 Thyrotoxicosis, unspecified without thyrotoxic crisis or storm: Secondary | ICD-10-CM | POA: Insufficient documentation

## 2016-03-05 DIAGNOSIS — I5032 Chronic diastolic (congestive) heart failure: Secondary | ICD-10-CM | POA: Diagnosis not present

## 2016-03-05 DIAGNOSIS — Z7982 Long term (current) use of aspirin: Secondary | ICD-10-CM | POA: Insufficient documentation

## 2016-03-05 DIAGNOSIS — S199XXA Unspecified injury of neck, initial encounter: Secondary | ICD-10-CM | POA: Diagnosis not present

## 2016-03-05 DIAGNOSIS — Z7901 Long term (current) use of anticoagulants: Secondary | ICD-10-CM | POA: Insufficient documentation

## 2016-03-05 DIAGNOSIS — R079 Chest pain, unspecified: Secondary | ICD-10-CM | POA: Diagnosis not present

## 2016-03-05 DIAGNOSIS — E042 Nontoxic multinodular goiter: Secondary | ICD-10-CM | POA: Diagnosis not present

## 2016-03-05 DIAGNOSIS — I700 Atherosclerosis of aorta: Secondary | ICD-10-CM | POA: Insufficient documentation

## 2016-03-05 DIAGNOSIS — Z90710 Acquired absence of both cervix and uterus: Secondary | ICD-10-CM | POA: Insufficient documentation

## 2016-03-05 DIAGNOSIS — I2510 Atherosclerotic heart disease of native coronary artery without angina pectoris: Secondary | ICD-10-CM | POA: Insufficient documentation

## 2016-03-05 DIAGNOSIS — K219 Gastro-esophageal reflux disease without esophagitis: Secondary | ICD-10-CM | POA: Insufficient documentation

## 2016-03-05 DIAGNOSIS — Z8249 Family history of ischemic heart disease and other diseases of the circulatory system: Secondary | ICD-10-CM | POA: Insufficient documentation

## 2016-03-05 DIAGNOSIS — I480 Paroxysmal atrial fibrillation: Secondary | ICD-10-CM | POA: Insufficient documentation

## 2016-03-05 DIAGNOSIS — J9811 Atelectasis: Secondary | ICD-10-CM | POA: Diagnosis not present

## 2016-03-05 DIAGNOSIS — I110 Hypertensive heart disease with heart failure: Secondary | ICD-10-CM | POA: Insufficient documentation

## 2016-03-05 DIAGNOSIS — M5136 Other intervertebral disc degeneration, lumbar region: Secondary | ICD-10-CM | POA: Diagnosis not present

## 2016-03-05 DIAGNOSIS — M25551 Pain in right hip: Secondary | ICD-10-CM | POA: Insufficient documentation

## 2016-03-05 DIAGNOSIS — I6789 Other cerebrovascular disease: Secondary | ICD-10-CM | POA: Diagnosis not present

## 2016-03-05 DIAGNOSIS — R2681 Unsteadiness on feet: Secondary | ICD-10-CM | POA: Diagnosis not present

## 2016-03-05 DIAGNOSIS — R29818 Other symptoms and signs involving the nervous system: Secondary | ICD-10-CM | POA: Diagnosis not present

## 2016-03-05 DIAGNOSIS — M47816 Spondylosis without myelopathy or radiculopathy, lumbar region: Secondary | ICD-10-CM | POA: Insufficient documentation

## 2016-03-05 DIAGNOSIS — Z823 Family history of stroke: Secondary | ICD-10-CM | POA: Insufficient documentation

## 2016-03-05 DIAGNOSIS — Z9181 History of falling: Secondary | ICD-10-CM | POA: Insufficient documentation

## 2016-03-05 DIAGNOSIS — E118 Type 2 diabetes mellitus with unspecified complications: Secondary | ICD-10-CM

## 2016-03-05 DIAGNOSIS — R4701 Aphasia: Secondary | ICD-10-CM | POA: Insufficient documentation

## 2016-03-05 LAB — RAPID URINE DRUG SCREEN, HOSP PERFORMED
AMPHETAMINES: NOT DETECTED
Barbiturates: NOT DETECTED
Benzodiazepines: NOT DETECTED
Cocaine: NOT DETECTED
Opiates: NOT DETECTED
TETRAHYDROCANNABINOL: NOT DETECTED

## 2016-03-05 LAB — CBG MONITORING, ED: Glucose-Capillary: 106 mg/dL — ABNORMAL HIGH (ref 65–99)

## 2016-03-05 LAB — PROTIME-INR
INR: 1.26
PROTHROMBIN TIME: 15.9 s — AB (ref 11.4–15.2)

## 2016-03-05 LAB — COMPREHENSIVE METABOLIC PANEL
ALK PHOS: 83 U/L (ref 38–126)
ALT: 15 U/L (ref 14–54)
ANION GAP: 11 (ref 5–15)
AST: 19 U/L (ref 15–41)
Albumin: 3.6 g/dL (ref 3.5–5.0)
BILIRUBIN TOTAL: 0.6 mg/dL (ref 0.3–1.2)
BUN: 16 mg/dL (ref 6–20)
CALCIUM: 9.3 mg/dL (ref 8.9–10.3)
CO2: 24 mmol/L (ref 22–32)
CREATININE: 1.59 mg/dL — AB (ref 0.44–1.00)
Chloride: 105 mmol/L (ref 101–111)
GFR calc non Af Amer: 27 mL/min — ABNORMAL LOW (ref >60)
GFR, EST AFRICAN AMERICAN: 32 mL/min — AB (ref >60)
GLUCOSE: 111 mg/dL — AB (ref 65–99)
Potassium: 4.3 mmol/L (ref 3.5–5.1)
SODIUM: 140 mmol/L (ref 135–145)
TOTAL PROTEIN: 6.4 g/dL — AB (ref 6.5–8.1)

## 2016-03-05 LAB — I-STAT CHEM 8, ED
BUN: 19 mg/dL (ref 6–20)
CALCIUM ION: 1.06 mmol/L — AB (ref 1.15–1.40)
CHLORIDE: 102 mmol/L (ref 101–111)
Creatinine, Ser: 1.5 mg/dL — ABNORMAL HIGH (ref 0.44–1.00)
Glucose, Bld: 109 mg/dL — ABNORMAL HIGH (ref 65–99)
HCT: 42 % (ref 36.0–46.0)
Hemoglobin: 14.3 g/dL (ref 12.0–15.0)
POTASSIUM: 4.4 mmol/L (ref 3.5–5.1)
SODIUM: 140 mmol/L (ref 135–145)
TCO2: 30 mmol/L (ref 0–100)

## 2016-03-05 LAB — DIFFERENTIAL
Basophils Absolute: 0 10*3/uL (ref 0.0–0.1)
Basophils Relative: 0 %
EOS PCT: 2 %
Eosinophils Absolute: 0.2 10*3/uL (ref 0.0–0.7)
LYMPHS ABS: 2.5 10*3/uL (ref 0.7–4.0)
LYMPHS PCT: 28 %
MONO ABS: 0.8 10*3/uL (ref 0.1–1.0)
Monocytes Relative: 10 %
NEUTROS ABS: 5.1 10*3/uL (ref 1.7–7.7)
Neutrophils Relative %: 60 %

## 2016-03-05 LAB — URINALYSIS, ROUTINE W REFLEX MICROSCOPIC
BILIRUBIN URINE: NEGATIVE
Glucose, UA: NEGATIVE mg/dL
HGB URINE DIPSTICK: NEGATIVE
KETONES UR: NEGATIVE mg/dL
Nitrite: NEGATIVE
PH: 5.5 (ref 5.0–8.0)
Protein, ur: 30 mg/dL — AB
SPECIFIC GRAVITY, URINE: 1.019 (ref 1.005–1.030)

## 2016-03-05 LAB — GRAM STAIN

## 2016-03-05 LAB — CBC
HEMATOCRIT: 42.1 % (ref 36.0–46.0)
HEMOGLOBIN: 13.3 g/dL (ref 12.0–15.0)
MCH: 31.7 pg (ref 26.0–34.0)
MCHC: 31.6 g/dL (ref 30.0–36.0)
MCV: 100.2 fL — AB (ref 78.0–100.0)
PLATELETS: 164 10*3/uL (ref 150–400)
RBC: 4.2 MIL/uL (ref 3.87–5.11)
RDW: 13.4 % (ref 11.5–15.5)
WBC: 8.6 10*3/uL (ref 4.0–10.5)

## 2016-03-05 LAB — URINE MICROSCOPIC-ADD ON

## 2016-03-05 LAB — POC OCCULT BLOOD, ED: Fecal Occult Bld: NEGATIVE

## 2016-03-05 LAB — I-STAT TROPONIN, ED: Troponin i, poc: 0.01 ng/mL (ref 0.00–0.08)

## 2016-03-05 LAB — T4, FREE: Free T4: 0.95 ng/dL (ref 0.61–1.12)

## 2016-03-05 LAB — ETHANOL

## 2016-03-05 LAB — TSH: TSH: 1.224 u[IU]/mL (ref 0.350–4.500)

## 2016-03-05 LAB — AMMONIA: Ammonia: 56 umol/L — ABNORMAL HIGH (ref 9–35)

## 2016-03-05 LAB — APTT: aPTT: 38 seconds — ABNORMAL HIGH (ref 24–36)

## 2016-03-05 MED ORDER — DILTIAZEM HCL ER 60 MG PO CP12
120.0000 mg | ORAL_CAPSULE | Freq: Two times a day (BID) | ORAL | Status: DC
  Administered 2016-03-06: 120 mg via ORAL
  Filled 2016-03-05 (×2): qty 2

## 2016-03-05 MED ORDER — ONDANSETRON HCL 4 MG/2ML IJ SOLN: 4.0000 mg | Freq: Four times a day (QID) | INTRAMUSCULAR | Status: DC | PRN

## 2016-03-05 MED ORDER — PANTOPRAZOLE SODIUM 40 MG PO TBEC
40.0000 mg | DELAYED_RELEASE_TABLET | Freq: Every day | ORAL | Status: DC
  Administered 2016-03-06: 40 mg via ORAL
  Filled 2016-03-05: qty 1

## 2016-03-05 MED ORDER — DORZOLAMIDE HCL-TIMOLOL MAL 2-0.5 % OP SOLN
1.0000 [drp] | Freq: Two times a day (BID) | OPHTHALMIC | Status: DC
  Filled 2016-03-05: qty 10

## 2016-03-05 MED ORDER — METOPROLOL TARTRATE 25 MG PO TABS
25.0000 mg | ORAL_TABLET | Freq: Three times a day (TID) | ORAL | Status: DC
  Administered 2016-03-06: 25 mg via ORAL
  Filled 2016-03-05: qty 1

## 2016-03-05 MED ORDER — POTASSIUM CHLORIDE CRYS ER 10 MEQ PO TBCR
10.0000 meq | EXTENDED_RELEASE_TABLET | Freq: Every day | ORAL | Status: DC
  Administered 2016-03-06: 10 meq via ORAL
  Filled 2016-03-05: qty 1

## 2016-03-05 MED ORDER — MAGNESIUM OXIDE 400 (241.3 MG) MG PO TABS
400.0000 mg | ORAL_TABLET | Freq: Every day | ORAL | Status: DC
  Administered 2016-03-06: 400 mg via ORAL
  Filled 2016-03-05: qty 1

## 2016-03-05 MED ORDER — ALBUTEROL SULFATE (2.5 MG/3ML) 0.083% IN NEBU
2.5000 mg | INHALATION_SOLUTION | Freq: Three times a day (TID) | RESPIRATORY_TRACT | Status: DC
  Administered 2016-03-06: 2.5 mg via RESPIRATORY_TRACT
  Filled 2016-03-05 (×2): qty 3

## 2016-03-05 MED ORDER — ALBUTEROL SULFATE (2.5 MG/3ML) 0.083% IN NEBU
2.5000 mg | INHALATION_SOLUTION | Freq: Four times a day (QID) | RESPIRATORY_TRACT | Status: DC
Start: 2016-03-05 — End: 2016-03-05
  Administered 2016-03-05: 2.5 mg via RESPIRATORY_TRACT
  Filled 2016-03-05: qty 3

## 2016-03-05 MED ORDER — ALBUTEROL SULFATE (2.5 MG/3ML) 0.083% IN NEBU
3.0000 mL | INHALATION_SOLUTION | Freq: Four times a day (QID) | RESPIRATORY_TRACT | Status: DC | PRN
  Administered 2016-03-06: 3 mL via RESPIRATORY_TRACT

## 2016-03-05 MED ORDER — METHIMAZOLE 5 MG PO TABS
5.0000 mg | ORAL_TABLET | ORAL | Status: DC
  Administered 2016-03-06: 5 mg via ORAL
  Filled 2016-03-05: qty 1

## 2016-03-05 MED ORDER — TRAZODONE HCL 50 MG PO TABS: 25.0000 mg | ORAL_TABLET | Freq: Every evening | ORAL | Status: DC | PRN

## 2016-03-05 MED ORDER — BENAZEPRIL HCL 20 MG PO TABS
20.0000 mg | ORAL_TABLET | Freq: Every day | ORAL | Status: DC
  Administered 2016-03-06: 20 mg via ORAL
  Filled 2016-03-05: qty 1

## 2016-03-05 MED ORDER — ONDANSETRON HCL 4 MG PO TABS: 4.0000 mg | ORAL_TABLET | Freq: Four times a day (QID) | ORAL | Status: DC | PRN

## 2016-03-05 MED ORDER — ACETAMINOPHEN 650 MG RE SUPP: 650.0000 mg | Freq: Four times a day (QID) | RECTAL | Status: DC | PRN

## 2016-03-05 MED ORDER — ALPRAZOLAM 0.25 MG PO TABS: 0.5000 mg | ORAL_TABLET | Freq: Every evening | ORAL | Status: DC | PRN

## 2016-03-05 MED ORDER — TIMOLOL MALEATE 0.5 % OP SOLN
1.0000 [drp] | Freq: Two times a day (BID) | OPHTHALMIC | Status: DC
  Filled 2016-03-05: qty 5

## 2016-03-05 MED ORDER — LINAGLIPTIN 5 MG PO TABS
5.0000 mg | ORAL_TABLET | Freq: Every day | ORAL | Status: DC
  Administered 2016-03-06: 5 mg via ORAL
  Filled 2016-03-05: qty 1

## 2016-03-05 MED ORDER — ATORVASTATIN CALCIUM 40 MG PO TABS: 40.0000 mg | ORAL_TABLET | Freq: Every day | ORAL | Status: DC

## 2016-03-05 MED ORDER — FUROSEMIDE 40 MG PO TABS
40.0000 mg | ORAL_TABLET | Freq: Every day | ORAL | Status: DC
  Administered 2016-03-06: 40 mg via ORAL
  Filled 2016-03-05: qty 1

## 2016-03-05 MED ORDER — ACETAMINOPHEN 500 MG PO TABS: 1000.0000 mg | ORAL_TABLET | Freq: Four times a day (QID) | ORAL | Status: DC | PRN

## 2016-03-05 MED ORDER — LATANOPROST 0.005 % OP SOLN
1.0000 [drp] | Freq: Every day | OPHTHALMIC | Status: DC
  Administered 2016-03-05: 1 [drp] via OPHTHALMIC
  Filled 2016-03-05: qty 2.5

## 2016-03-05 MED ORDER — MORPHINE SULFATE (PF) 2 MG/ML IV SOLN: 2.0000 mg | INTRAVENOUS | Status: DC | PRN

## 2016-03-05 MED ORDER — CALCIUM CARBONATE-VITAMIN D 500-200 MG-UNIT PO TABS: 1.0000 | ORAL_TABLET | Freq: Every day | ORAL | Status: DC

## 2016-03-05 MED ORDER — CALCIUM-MAGNESIUM-VITAMIN D 500-250-200 MG-MG-UNIT PO TABS: ORAL_TABLET | Freq: Every day | ORAL | Status: DC

## 2016-03-05 MED ORDER — INSULIN ASPART 100 UNIT/ML ~~LOC~~ SOLN: 0.0000 [IU] | Freq: Three times a day (TID) | SUBCUTANEOUS | Status: DC

## 2016-03-05 MED ORDER — ENOXAPARIN SODIUM 40 MG/0.4ML ~~LOC~~ SOLN
40.0000 mg | SUBCUTANEOUS | Status: DC
  Administered 2016-03-05: 40 mg via SUBCUTANEOUS
  Filled 2016-03-05: qty 0.4

## 2016-03-05 MED ORDER — ARTIFICIAL TEARS OP OINT: TOPICAL_OINTMENT | Freq: Every evening | OPHTHALMIC | Status: DC | PRN

## 2016-03-05 MED ORDER — ASPIRIN 81 MG PO CHEW
81.0000 mg | CHEWABLE_TABLET | Freq: Every day | ORAL | Status: DC
Start: 2016-03-05 — End: 2016-03-06
  Administered 2016-03-06: 81 mg via ORAL
  Filled 2016-03-05: qty 1

## 2016-03-05 MED ORDER — ALBUTEROL SULFATE HFA 108 (90 BASE) MCG/ACT IN AERS: 2.0000 | INHALATION_SPRAY | Freq: Four times a day (QID) | RESPIRATORY_TRACT | Status: DC | PRN

## 2016-03-05 MED ORDER — ACETAMINOPHEN 325 MG PO TABS: 650.0000 mg | ORAL_TABLET | Freq: Four times a day (QID) | ORAL | Status: DC | PRN

## 2016-03-05 MED ORDER — DORZOLAMIDE HCL 2 % OP SOLN
1.0000 [drp] | Freq: Two times a day (BID) | OPHTHALMIC | Status: DC
  Administered 2016-03-05 – 2016-03-06 (×2): 1 [drp] via OPHTHALMIC
  Filled 2016-03-05: qty 10

## 2016-03-05 MED ORDER — BUDESONIDE 0.5 MG/2ML IN SUSP
0.5000 mg | Freq: Two times a day (BID) | RESPIRATORY_TRACT | Status: DC
  Administered 2016-03-05 – 2016-03-06 (×2): 0.5 mg via RESPIRATORY_TRACT
  Filled 2016-03-05 (×3): qty 2

## 2016-03-05 MED ORDER — VITAMIN D3 25 MCG (1000 UNIT) PO TABS
1000.0000 [IU] | ORAL_TABLET | Freq: Every day | ORAL | Status: DC
  Filled 2016-03-05: qty 1

## 2016-03-05 NOTE — ED Notes (Signed)
Pt. Presented as code stroke today, LKW 1300 when pt. Went to nap. Pt. Lives at home with daughter who found pt in bed with eyes open and unable to speak. No neuro deficits otherwise. Pt. Scored NIHSS 1 for inappropriate answer to question. Pt. Initially following commands in ED but not speaking. Pt. Now speaking in full sentences and AxO x4. Hx of some memory issues, no diagnosed dementia. Pt. Failed stroke swallow screen due to coughing following drinking from straw, SLP ordered. Hx of stroke approx 3 years ago, no deficits. Code stroke cancelled by neuro.

## 2016-03-05 NOTE — Consult Note (Signed)
Neurology Consult Note  Reason for Consultation: CODE STROKE  Requesting provider: EMS, ED MD Jacubowitz  CC: None  HPI: This is a 82-yo woman with history of stroke and aphasia who now presents to the [facility] ED after her daughter found her lying in bed unable to speak. History is obtained largely from EMS and review of the patient's medical record as the patient has limited verbal output.   According to EMS, the patient was home with her daughter. She went to take a nap at about 1300. When her daughter checked on her later, she found her lying in bed with her eyes open, unable to speak. She called 911 and reported to EMS that this was similar to symptoms she experienced with a stroke three years ago. EMS reports that the patient was not speaking but could follow commands, no focal deficits noted. CODE STROKE was activated. On arrival to the ED she continued to be largely nonverbal but would correctly answer yes/no questions both verbally and by nodding/shaking her head. NIHSS was 1. She was taken for an emergent CT of the head which showed several scattered areas of well demarcated hypodensity consistent with late subacute to chronic ischemic infarction involving both cerebral hemispheres. No obvious acute pathology was appreciated. While she was initially demonstrating limited verbal output, this improved after being in the emergency department for a few minutes. The patient's presentation seemed to be more consistent with encephalopathy and possible recrudescence of prior deficits so code stroke was canceled.  Last known well: 27 NIH stroke scale score: 1 TPA given?: No, presentation suspicious for recrudescence of prior deficits and possible encephalopathy  On reviewing her chart, she has a history of paroxysmal atrial fibrillation. She had been on Coumadin several years ago but this was stopped because she was having frequent falls. She was subsequently placed on Eliquis following her  stroke in 2015 but this was stopped in October 2015 because she suffered a GI bleed. She has been on aspirin 81 mg since. She has a history of memory deficits as well but these are not well-characterized.  PMH:  Past Medical History:  Diagnosis Date  . Anxiety 11/20/2011  . Asthma    UNDER THE CARE [facility] DR PAZ  . CAD (coronary artery disease)    s/p CABG s/p AO valve replacement ([place] [facility])  . CVA (cerebral infarction) 08-2013   multiple, L, d/t Afib, started eloquis  . Diabetes mellitus    type 2   . Dizziness    Chronic, admiet 07-2010,saw neuro, thought to be a peripheral issue   . Dry skin   . GERD (gastroesophageal reflux disease)   . Hyperlipidemia   . Hypertension   . Hyperthyroidism   . Keloid    @ chest  . Memory loss   . Osteoarthritis   . Osteopenia    per dexa 12/09  . Paroxysmal atrial fibrillation ([place])    cards d/c coumadin 12/2008 d/t persisten NSR and frequent falls- restarted coumadin july 2011, now on Eliquis  . Recurrent UTI   . Shortness of breath dyspnea   . TIA (transient ischemic attack)     PSH:  Past Surgical History:  Procedure Laterality Date  . ABDOMINAL HYSTERECTOMY    . AORTIC VALVE REPLACEMENT  1998  . cagb  1998  . CAROTID DOPPLER  07/13/12   BILATERAL BULB/PROXIMAL ICAS;MILD AMOUNT OF FIBROUS PLAQUE WITH NO DIAMETER REDUCTION.  . CESAREAN SECTION     x 2  . COLONOSCOPY  WITH PROPOFOL N/A 03/30/2014   Procedure: COLONOSCOPY WITH PROPOFOL;  Surgeon: Irene Shipper, MD;  Location: [facility];  Service: Endoscopy;  Laterality: N/A;  . CORONARY ARTERY BYPASS GRAFT  1998   SVG TO RCA  . HEMORRHOID SURGERY    . MYOCARDIAL PERFUSION STUDY  12/19/09   NORMAL PATTERN OF PERFUSION IN ALL REGIONS.EF 75%.  . OOPHORECTOMY    . TOTAL KNEE ARTHROPLASTY  1999  . TRANSTHORACIC ECHOCARDIOGRAM  09/11/11   LVEF >55%.STAGE 1 DIASTOLIC DYSFUNCTION,ELEVATED LV FILLING PRESSURE.BIOPROSTHETIC AORTIC VALVE-PEAK AND MEAN GRADIENTS OF 17 MMHG AND 8 MMHG.SIGMOID  SEPTUM.MILD TO MOD MR.MILD TO MOD TR.RVSP 46 MMHG.    Family history: Family History  Problem Relation Age of Onset  . Heart attack Father   . Coronary artery disease Son 26    deceased  . Hypertension Brother   . Stroke Brother   . Colon cancer Neg Hx   . Breast cancer Neg Hx   . Thyroid disease Neg Hx     Social history:  Social History   Social History  . Marital status: Divorced    Spouse name: N/A  . Number of children: 4  . Years of education: N/A   Occupational History  . retired  Retired   Social History Main Topics  . Smoking status: Former Research scientist (life sciences)  . Smokeless tobacco: Never Used     Comment: quit 2004, smoked 1.5 ppd  . Alcohol use No  . Drug use: No  . Sexual activity: Not on file   Other Topics Concern  . Not on file   Social History Narrative   Back living at her house since the last visit, GGson lives w/ her (80 y/o)   Had 3 daughter- 2 son  (lost oldest daughter and son), 2 living daughters in [place]    Current inpatient meds:  No current facility-administered medications for this encounter.    Current Outpatient Prescriptions  Medication Sig Dispense Refill  . acetaminophen (TYLENOL) 500 MG tablet Take 1,000 mg by mouth every 6 (six) hours as needed for moderate pain (pain).    Marland Kitchen albuterol (PROAIR HFA) 108 (90 Base) MCG/ACT inhaler Inhale 2 puffs into the lungs every 6 (six) hours as needed for wheezing or shortness of breath. 18 g 6  . ALPRAZolam (XANAX) 0.5 MG tablet Take 1 tablet (0.5 mg total) by mouth at bedtime as needed for anxiety or sleep. 30 tablet 3  . artificial tears (LACRILUBE) OINT ophthalmic ointment Place into both eyes at bedtime as needed for dry eyes. 3.5 g 11  . aspirin 81 MG tablet Take 81 mg by mouth daily.    Marland Kitchen atorvastatin (LIPITOR) 40 MG tablet Take 1 tablet (40 mg total) by mouth at bedtime. 90 tablet 1  . benazepril (LOTENSIN) 20 MG tablet TAKE 1 TABLET BY MOUTH EVERY DAY (NEED OFFICE VISIT FOR MORE REFILLS!!) 90  tablet 0  . budesonide (PULMICORT) 0.5 MG/2ML nebulizer solution Take 2 mLs (0.5 mg total) by nebulization 2 (two) times daily. 120 mL 5  . Calcium Carbonate (CALCIUM 500 PO) Take 500 mg by mouth at bedtime.     . cholecalciferol (VITAMIN D) 1000 UNITS tablet Take 1,000 Units by mouth at bedtime.     Marland Kitchen diltiazem (CARDIZEM SR) 120 MG 12 hr capsule Take 1 capsule (120 mg total) by mouth 2 (two) times daily. 30 capsule 6  . dorzolamide-timolol (COSOPT) 22.3-6.8 MG/ML ophthalmic solution Place 1 drop into both eyes 2 (two) times daily.     Marland Kitchen  esomeprazole (NEXIUM) 40 MG capsule Take 1 capsule (40 mg total) by mouth daily before breakfast. 90 capsule 1  . furosemide (LASIX) 20 MG tablet Take 2 tablets (40 mg total) by mouth daily. 180 tablet 1  . glucose blood test strip 1 each by Other route as needed for other (blood sugar test). Reported on 11/10/2015    . ketoconazole (NIZORAL) 2 % cream Apply 1 application topically daily. 30 g 0  . Ketoprofen 10 % CREA Apply twice a day to the knee as needed 120 g 2  . levalbuterol (XOPENEX) 0.63 MG/3ML nebulizer solution Take 3 mLs (0.63 mg total) by nebulization 2 (two) times daily. 180 mL 5  . magnesium oxide (MAG-OX) 400 (241.3 Mg) MG tablet Take 1 tablet (400 mg total) by mouth daily. 90 tablet 1  . methimazole (TAPAZOLE) 5 MG tablet Take 1 tablet (5 mg total) by mouth 3 (three) times a week. 40 tablet 0  . metoprolol tartrate (LOPRESSOR) 25 MG tablet Take 1 tablet (25 mg total) by mouth 3 (three) times daily. 270 tablet 1  . nitroGLYCERIN (NITROSTAT) 0.4 MG SL tablet Place 0.4 mg under the tongue every 5 (five) minutes as needed for chest pain (chest pain). Reported on 11/10/2015    . ONETOUCH DELICA LANCETS [id number] MISC Test blood sugars no more than twice daily. (Patient not taking: Reported on 01/10/2016) 100 each 12  . potassium chloride (KLOR-CON M10) 10 MEQ tablet Take 1 tablet (10 mEq total) by mouth daily. 90 tablet 1  . sitaGLIPtin (JANUVIA) 50 MG tablet Take  1 tablet (50 mg total) by mouth daily. 90 tablet 1  . TRAVATAN Z 0.004 % SOLN ophthalmic solution Place 1 drop into both eyes at bedtime.       Allergies: Allergies  Allergen Reactions  . Hydrocodone Itching and Nausea Only  . Tramadol Hcl Itching and Nausea Only    ROS: As per HPI. A full 14-point review of systems was performed and is otherwise notable for some back pain. Review of systems is limited by the paucity of spontaneous speech demonstrated by the patient.  PE:  Ht 5\' 1"  (1.549 m)   SpO2 98%  HR 64    BP 147/78     RR 22     SpO2 100%  General: WD obese African-American woman who appears to be mildly uncomfortable, moaning at times and grimacing with movement. She has limited spontaneous verbal output but will answer questions properly with yes and no as well as by nodding and shaking her head. She follows all midline and appendicular commands without delay. HEENT: Normocephalic. Arcus senilis noted in both eyes. Neck supple without LAD. MMM, OP clear. Sclerae anicteric. No conjunctival injection.  CV: Irregular, no murmur. Carotid pulses full and symmetric, no bruits. Distal pulses 2+ and symmetric. She has a well-healed sternotomy incision with some keloid formation. Lungs: CTAB on anterior auscultation.  Abdomen: Soft, non-distended, non-tender. Bowel sounds present x4.  Extremities: No C/C/E. Neuro:  CN: Pupils are equal and round. They are symmetrically reactive from 3-->2 mm. Visual fields appear to be full. EOMI with some breakup of smooth pursuits, no nystagmus. No reported diplopia. Facial sensation is intact to light touch. Face is symmetric at rest with normal strength and mobility. Hearing is intact to conversational voice. Bilateral SCM and trapezii are 5/5. Tongue is midline with normal bulk and mobility.  Motor: Normal bulk, tone, and strength. She does grimace with some movements, particularly those involving the left side. No  tremor or other abnormal movements. No  drift.  Sensation: Intact to pin.Marland Kitchen  DTRs: 2+ on the left, 3+ on the right Toes downgoing  on the left, mute on the right. Coordination: Finger-to-nose and heel-to-shin are without dysmetria.   Labs:  Lab Results  Component Value Date   WBC 8.6 03/05/2016   HGB 14.3 03/05/2016   HCT 42.0 03/05/2016   PLT 164 03/05/2016   GLUCOSE 109 (H) 03/05/2016   CHOL 156 07/11/2015   TRIG 72.0 07/11/2015   HDL 49.90 07/11/2015   LDLCALC 92 07/11/2015   ALT 17 07/26/2015   AST 21 07/26/2015   NA 140 03/05/2016   K 4.4 03/05/2016   CL 102 03/05/2016   CREATININE 1.50 (H) 03/05/2016   BUN 19 03/05/2016   CO2 31 01/10/2016   TSH 1.28 02/20/2016   INR 1.55 (H) 03/28/2014   HGBA1C 6.9 (H) 01/10/2016   MICROALBUR 0.1 03/06/2009   Troponin 0.01 Serum ammonia attending TSH pending Free T4 pending Urinalysis pending Urine drug screen pending  Imaging:  I have personally and independently reviewed the CT scan of the head without contrast from today. There are several well-circumscribed hypodensities involving both cerebral hemispheres. The largest is in the left MCA territory, involving the left frontal lobe and left insula. Two additional areas are noted in the right frontal lobe and the right parietal lobe. These are consistent with chronic ischemic infarctions without any evidence of obvious acute ischemia. Moderate diffuse generalized atrophy is noted. There is also a moderate burden of chronic small vessel ischemic change. When compared to a previous MRI scan from 08/27/13, the left MCA territory infarction seen on today's CT scan is consistent with the acute stroke seen on that MRI. The right-sided infarcts have occurred in the interim 2 years.   Assessment and Plan:  1. Acute encephalopathy: Today's presentation is most suggestive of an encephalopathy. She is not demonstrating any significant neurologic deficits on current examination so I do not strongly suspect that she has suffered an acute  stroke. It is not clear to me that she was aphasic as much as she was simply not answering questions initially. Findings on her CT scan are more suggestive of chronic infarcts, particularly the left MCA territory stroke which was evident on a previous MRI scan in March 2015. Urinalysis is pending, as are metabolic labs including TSH and serum ammonia.She has evidence of acute renal dysfunction with serum creatinine elevated today, today 1.59 up from 0.95 and August 2017. Given her prior strokes, consideration must be given to possible seizure though no overt seizure activity was observed and she did not appear to be particularly post ictal as she has been noted to be following commands for EMS and here in the emergency department, making seizure less likely. Simple partial seizure resulting in speech arrest is possible, however. Optimize metabolic status. Avoid sedating medications, including benzodiazepine, opiates, and anything strong anticholinergic properties. I will check EEG. Defer antiepileptic therapy for now unless EEG is markedly abnormal to suggest increased risk of seizure.  2. Cerebrovascular disease: Findings on CT scan are more suggestive of chronic ischemic infarctions. She has not had any persisting deficits today to suggest an acute stroke, though this cannot be fully excluded given her significant risk factors. She is not a candidate for thrombolytic therapy due to potential alternate explanation for her symptoms and resolution of symptoms. These risk factors include atrial fibrillation (not on anticoagulation), prior stroke, coronary artery disease, diabetes, hyperlipidemia, hypertension, and age. She  is not a candidate for anticoagulation due to history of GI bleed so I would continue aspirin for secondary prevention. Ensure control of blood pressure, lipids, and glucose. Continue atorvastatin.  This was discussed with the ED physician at the time of this evaluation. No family was present at  the bedside.

## 2016-03-05 NOTE — Progress Notes (Signed)
Spoke with Chaney Malling regarding C collar. Ct negative. No c/o pain in the neck with movement. Removed C collar.

## 2016-03-05 NOTE — ED Notes (Signed)
Cervical collar was placed on pt per PA request. Pt family stated that pt has not fallen for awhile. PA informed pt has not fallen recently.

## 2016-03-05 NOTE — Code Documentation (Signed)
82yo female arriving to [facility] via [place] at 1502.  Patient from home where she took a nap at 1300.  Patient was found by her daughter with her eyes open and unable to speak.  EMS was called and activated a code stroke for speech difficulty.  Labs drawn and patient to CT on arrival.  Stroke team to the bedside.  CT completed.  NIHSS 1, see documentation for details and code stroke times.  Patient moaning and will nod to questions but will only state her age on exam.  Patient able to follow commands and move all extremities symmetrically on exam.  Patient squeezes her eyes tight when attempting to assess vision and will only open eyes intermittently to continuous stimulation.  Patient with h/o stroke with reportedly similar symptoms.  Dr. Shon Hale at the bedside.  No acute stroke treatment at this time.  Code stroke canceled.  Bedside handoff with ED RN Martie Round.

## 2016-03-05 NOTE — Progress Notes (Signed)
Patient failed swallow screen. NPO pending ST eval. HS PO meds held. Audiological scientist notified.

## 2016-03-05 NOTE — ED Triage Notes (Signed)
Pt presents via EMS as code stroke. Pt. Lives at home with daughter, daughter states pt. Went to take a nap at 1300, when she went to check on her she noted she was lying in bed with eyes open and unable to speak. Pt. With hx of CVA with aphasia. CBG 106. Pt. Able to follow commands.

## 2016-03-05 NOTE — ED Provider Notes (Signed)
[facility] DEPT Provider Note   CSN: [id number] Arrival date & time: 03/05/16  1502     History   Chief Complaint Chief Complaint  Patient presents with  . Code Stroke    HPI Rebecca Skinner is a 82 y.o. female.  History of Present Illness  Patient information was obtained from EMS personnel. History/Exam limitations: mental status, due to condition and communication barrier Language and aphasia. Patient presented to the Emergency Department by ambulance where the patient received oxygen prior to arrival.  Chief Complaint: Code Stroke  Patient presents for evaluation of inability to speak. Onset of symptoms were endorsed as 12 PM per the daughter at bedside.. Symptoms are currently of resolving severity. Symptoms have lasted since then however are near resolved. Symptoms endorsed as similar to previous CVA deficits in which patient was unable to speak per EMS. Prior stroke history: yes, exact type of CVA unknown. Patient also complains of no CP, SOB, abdominal pain or neck pain or fever or chills, per shaking head yes and no. However endorses fall at unknown time per the patient who states between 1 to 2 weeks ago.    Patient door versus there is no urinary or bowel retention or incontinence and no new weakness in lower extremities.  Past Medical History:  Diagnosis Date  . Anxiety 11/20/2011  . Asthma    UNDER THE CARE [facility] DR PAZ  . CAD (coronary artery disease)    s/p CABG s/p AO valve replacement ([place] [facility])  . CVA (cerebral infarction) 08-2013   multiple, L, d/t Afib, started eloquis  . Diabetes mellitus    type 2   . Dizziness    Chronic, admiet 07-2010,saw neuro, thought to be a peripheral issue   . Dry skin   . GERD (gastroesophageal reflux disease)   . Hyperlipidemia   . Hypertension   . Hyperthyroidism   . Keloid    @ chest  . Memory loss   . Osteoarthritis   . Osteopenia    per dexa 12/09  . Paroxysmal atrial fibrillation ([place])    cards d/c  coumadin 12/2008 d/t persisten NSR and frequent falls- restarted coumadin july 2011, now on Eliquis  . Recurrent UTI   . Shortness of breath dyspnea   . TIA (transient ischemic attack)     Patient Active Problem List   Diagnosis Date Noted  . Altered mental status 03/05/2016  . Acute kidney injury ([place]) 03/05/2016  . Diabetes mellitus with complication ([place])   . Exposure keratitis 09/23/2015  . Keratopathy, band 09/13/2015  . PCP NOTES >>>>> 04/05/2015  . Chronic diastolic heart failure ([place]) 12/29/2014  . Edema 12/20/2014  . DM type 2 (diabetes mellitus, type 2) ([place]) 12/20/2014  . Dyspnea 12/20/2014  . Chronic atrial fibrillation ([place]) 05/12/2014  . Angiectasia 03/30/2014  . GI bleed 03/28/2014  . Memory loss 09/16/2013  . Annual physical exam 11/21/2011  . ATRIAL FIBRILLATION, chronic 03/05/2007  . Diabetes mellitus with circulatory complication ([place]) [id number]  . S/P AVR (aortic valve replacement) 10/15/2006  . Thyrotoxicosis 09/03/2006  . Dyslipidemia 09/03/2006  . Essential hypertension 09/03/2006  . CAD (coronary artery disease) of artery bypass graft 09/03/2006  . Asthma, chronic 09/03/2006    Past Surgical History:  Procedure Laterality Date  . ABDOMINAL HYSTERECTOMY    . AORTIC VALVE REPLACEMENT  1998  . cagb  1998  . CAROTID DOPPLER  07/13/12   BILATERAL BULB/PROXIMAL ICAS;MILD AMOUNT OF FIBROUS PLAQUE WITH NO DIAMETER REDUCTION.  . CESAREAN  SECTION     x 2  . COLONOSCOPY WITH PROPOFOL N/A 03/30/2014   Procedure: COLONOSCOPY WITH PROPOFOL;  Surgeon: Irene Shipper, MD;  Location: [facility] ENDOSCOPY;  Service: Endoscopy;  Laterality: N/A;  . CORONARY ARTERY BYPASS GRAFT  1998   SVG TO RCA  . HEMORRHOID SURGERY    . MYOCARDIAL PERFUSION STUDY  12/19/09   NORMAL PATTERN OF PERFUSION IN ALL REGIONS.EF 75%.  . OOPHORECTOMY    . TOTAL KNEE ARTHROPLASTY  1999  . TRANSTHORACIC ECHOCARDIOGRAM  09/11/11   LVEF >55%.STAGE 1 DIASTOLIC DYSFUNCTION,ELEVATED LV FILLING  PRESSURE.BIOPROSTHETIC AORTIC VALVE-PEAK AND MEAN GRADIENTS OF 17 MMHG AND 8 MMHG.SIGMOID SEPTUM.MILD TO MOD MR.MILD TO MOD TR.RVSP 46 MMHG.    OB History    No data available       Home Medications    Prior to Admission medications   Medication Sig Start Date End Date Taking? Authorizing Provider  acetaminophen (TYLENOL) 500 MG tablet Take 1,000 mg by mouth every 6 (six) hours as needed for moderate pain (pain).    Historical Provider, MD  albuterol (PROAIR HFA) 108 (90 Base) MCG/ACT inhaler Inhale 2 puffs into the lungs every 6 (six) hours as needed for wheezing or shortness of breath. 06/26/15   Colon Branch, MD  ALPRAZolam Duanne Moron) 0.5 MG tablet Take 1 tablet (0.5 mg total) by mouth at bedtime as needed for anxiety or sleep. 10/16/15   Colon Branch, MD  artificial tears (LACRILUBE) OINT ophthalmic ointment Place into both eyes at bedtime as needed for dry eyes. 09/23/15   Robyn Haber, MD  aspirin 81 MG tablet Take 81 mg by mouth daily.    Historical Provider, MD  atorvastatin (LIPITOR) 40 MG tablet Take 1 tablet (40 mg total) by mouth at bedtime. 11/10/15   Colon Branch, MD  benazepril (LOTENSIN) 20 MG tablet TAKE 1 TABLET BY MOUTH EVERY DAY (NEED OFFICE VISIT FOR MORE REFILLS!!) 02/28/16   Troy Sine, MD  budesonide (PULMICORT) 0.5 MG/2ML nebulizer solution Take 2 mLs (0.5 mg total) by nebulization 2 (two) times daily. 11/10/15   Colon Branch, MD  Calcium Carbonate (CALCIUM 500 PO) Take 500 mg by mouth at bedtime.     Historical Provider, MD  cholecalciferol (VITAMIN D) 1000 UNITS tablet Take 1,000 Units by mouth at bedtime.     Historical Provider, MD  diltiazem (CARDIZEM SR) 120 MG 12 hr capsule Take 1 capsule (120 mg total) by mouth 2 (two) times daily. 10/27/15   Troy Sine, MD  dorzolamide-timolol (COSOPT) 22.3-6.8 MG/ML ophthalmic solution Place 1 drop into both eyes 2 (two) times daily.  03/23/14   Historical Provider, MD  esomeprazole (NEXIUM) 40 MG capsule Take 1 capsule (40 mg total)  by mouth daily before breakfast. 01/10/16   Colon Branch, MD  furosemide (LASIX) 20 MG tablet Take 2 tablets (40 mg total) by mouth daily. 01/10/16   Colon Branch, MD  glucose blood test strip 1 each by Other route as needed for other (blood sugar test). Reported on 11/10/2015    Historical Provider, MD  ketoconazole (NIZORAL) 2 % cream Apply 1 application topically daily. 01/10/16   Colon Branch, MD  Ketoprofen 10 % CREA Apply twice a day to the knee as needed 07/11/15   Colon Branch, MD  levalbuterol Penne Lash) 0.63 MG/3ML nebulizer solution Take 3 mLs (0.63 mg total) by nebulization 2 (two) times daily. 11/10/15   Colon Branch, MD  magnesium oxide (MAG-OX) 400 (  241.3 Mg) MG tablet Take 1 tablet (400 mg total) by mouth daily. 11/10/15   Colon Branch, MD  methimazole (TAPAZOLE) 5 MG tablet Take 1 tablet (5 mg total) by mouth 3 (three) times a week. 11/10/15   Renato Shin, MD  metoprolol tartrate (LOPRESSOR) 25 MG tablet Take 1 tablet (25 mg total) by mouth 3 (three) times daily. 11/10/15   Colon Branch, MD  nitroGLYCERIN (NITROSTAT) 0.4 MG SL tablet Place 0.4 mg under the tongue every 5 (five) minutes as needed for chest pain (chest pain). Reported on 11/10/2015    Historical Provider, MD  potassium chloride (KLOR-CON M10) 10 MEQ tablet Take 1 tablet (10 mEq total) by mouth daily. 11/10/15   Colon Branch, MD  sitaGLIPtin (JANUVIA) 50 MG tablet Take 1 tablet (50 mg total) by mouth daily. 01/10/16   Colon Branch, MD  TRAVATAN Z 0.004 % SOLN ophthalmic solution Place 1 drop into both eyes at bedtime.  06/16/13   Historical Provider, MD    Family History Family History  Problem Relation Age of Onset  . Heart attack Father   . Coronary artery disease Son 60    deceased  . Hypertension Brother   . Stroke Brother   . Colon cancer Neg Hx   . Breast cancer Neg Hx   . Thyroid disease Neg Hx     Social History Social History  Substance Use Topics  . Smoking status: Former Research scientist (life sciences)  . Smokeless tobacco: Never Used     Comment: quit 2004,  smoked 1.5 ppd  . Alcohol use No     Allergies   Hydrocodone and Tramadol hcl   Review of Systems Review of Systems  Musculoskeletal: Positive for back pain.  Neurological: Positive for speech difficulty.  All other systems reviewed and are negative.    Physical Exam Updated Vital Signs BP (!) 183/97 (BP Location: Right Arm)   Pulse 71   Temp 98 F (36.7 C) (Oral)   Resp 18   Ht 5\' 1"  (1.549 m)   SpO2 99%   Physical Exam  Constitutional: She is oriented to person, place, and time. She appears well-developed and well-nourished.  Non-toxic appearance. She does not appear ill. No distress.  HENT:  Head: Normocephalic and atraumatic.  Right Ear: External ear normal.  Left Ear: External ear normal.  Mouth/Throat: Oropharynx is clear and moist.  Eyes: EOM are normal. Pupils are equal, round, and reactive to light. No scleral icterus.  Neck: Normal range of motion. Neck supple. No tracheal deviation present.  Cardiovascular: Normal heart sounds and intact distal pulses.   No murmur heard. Pulmonary/Chest: Effort normal and breath sounds normal. No stridor. No respiratory distress. She has no wheezes. She has no rales.  Abdominal: Soft. Bowel sounds are normal. She exhibits no distension. There is no tenderness. There is no rebound and no guarding.  Genitourinary: Rectal exam shows no external hemorrhoid, no internal hemorrhoid, no mass, no tenderness, anal tone normal and guaiac negative stool.  Musculoskeletal: Normal range of motion. She exhibits no deformity.       Cervical back: She exhibits tenderness, bony tenderness and pain. She exhibits no deformity.       Lumbar back: She exhibits tenderness, bony tenderness and pain. She exhibits no deformity.  Neurological: She is alert and oriented to person, place, and time. She has normal strength and normal reflexes. No cranial nerve deficit or sensory deficit.  Skin: Skin is warm and dry. Capillary refill takes less  than 2  seconds.  Psychiatric: She has a normal mood and affect. Her behavior is normal.  Nursing note and vitals reviewed.   ED Treatments / Results  Labs (all labs ordered are listed, but only abnormal results are displayed) Labs Reviewed  PROTIME-INR - Abnormal; Notable for the following:       Result Value   Prothrombin Time 15.9 (*)    All other components within normal limits  APTT - Abnormal; Notable for the following:    aPTT 38 (*)    All other components within normal limits  CBC - Abnormal; Notable for the following:    MCV 100.2 (*)    All other components within normal limits  COMPREHENSIVE METABOLIC PANEL - Abnormal; Notable for the following:    Glucose, Bld 111 (*)    Creatinine, Ser 1.59 (*)    Total Protein 6.4 (*)    GFR calc non Af Amer 27 (*)    GFR calc Af Amer 32 (*)    All other components within normal limits  URINALYSIS, ROUTINE W REFLEX MICROSCOPIC (NOT AT [facility]) - Abnormal; Notable for the following:    APPearance HAZY (*)    Protein, ur 30 (*)    Leukocytes, UA SMALL (*)    All other components within normal limits  URINE MICROSCOPIC-ADD ON - Abnormal; Notable for the following:    Squamous Epithelial / LPF 6-30 (*)    Bacteria, UA MANY (*)    Casts HYALINE CASTS (*)    All other components within normal limits  CBG MONITORING, ED - Abnormal; Notable for the following:    Glucose-Capillary 106 (*)    All other components within normal limits  I-STAT CHEM 8, ED - Abnormal; Notable for the following:    Creatinine, Ser 1.50 (*)    Glucose, Bld 109 (*)    Calcium, Ion 1.06 (*)    All other components within normal limits  GRAM STAIN  URINE CULTURE  DIFFERENTIAL  URINE RAPID DRUG SCREEN, HOSP PERFORMED  ETHANOL  AMMONIA  TSH  T4, FREE  HEMOGLOBIN [id number]  BASIC METABOLIC PANEL  CBC  I-STAT TROPOININ, ED  POC OCCULT BLOOD, ED    EKG  EKG Interpretation  Date/Time:  Tuesday March 05 2016 15:30:52 EDT Ventricular Rate:  66 PR Interval:     QRS Duration: 82 QT Interval:  444 QTC Calculation: 465 R Axis:   64 Text Interpretation:  Atrial fibrillation Abnormal ECG No significant change since last tracing Confirmed by Winfred Leeds  MD, SAM [phone]) on 03/05/2016 3:36:12 PM       Radiology Dg Lumbar Spine 2-3 Views  Result Date: 03/05/2016 CLINICAL DATA:  Midline spine pain EXAM: LUMBAR SPINE - 2-3 VIEW COMPARISON:  Lumbar spine radiograph 03/26/2013 FINDINGS: There is multilevel intervertebral disc space narrowing, greatest at L4-5 and L2-3. Unchanged from the prior study. There is lower lumbar facet arthrosis. No static subluxation. No acute fracture. There is extensive atherosclerotic calcification within the abdominal aorta. Mild multilevel lower thoracic height loss, unchanged. IMPRESSION: 1. No acute fracture or static subluxation of the lumbar spine. 2. Advanced degenerative disc disease, greatest at L2-3 and L4-5. 3. Aortic atherosclerosis. Electronically Signed   By: Ulyses Jarred M.D.   On: 03/05/2016 17:50   Ct Cervical Spine Wo Contrast  Result Date: 03/05/2016 CLINICAL DATA:  82 y/o F; patient found lying and bled without eyes open unable to speak. History of falling and unsteady gait. EXAM: CT CERVICAL SPINE WITHOUT CONTRAST TECHNIQUE: Multidetector  CT imaging of the cervical spine was performed without intravenous contrast. Multiplanar CT image reconstructions were also generated. COMPARISON:  08/27/2013 CT angiogram of the neck.  High FINDINGS: Alignment: Straightening of cervical lordosis. Stable minimal C3-4 anterolisthesis. Skull base and vertebrae: No acute fracture. No primary bone lesion or focal pathologic process. Soft tissues and spinal canal: No prevertebral fluid or swelling. No visible canal hematoma. Multinodular thyroid goiter. Otherwise no discrete mass or cervical lymphadenopathy is identified on this noncontrast CT. Patent aerodigestive tract. Calcifications of the carotid bifurcations. Mildly patulous  esophagus in the lower neck. Disc levels: Moderate cervical spondylosis with endplate degenerative changes and disc space narrowing greatest from C4 the T1. Multilevel marginal osteophytes, uncovertebral hypertrophy, and facet hypertrophy. No high-grade bony canal stenosis. Bilateral C3-4 and left C5-6 moderate foraminal narrowing. No significant interval change. Upper chest: Negative. Other: Negative. IMPRESSION: No acute fracture or malalignment of cervical spine identified. Stable moderate cervical spondylosis. Electronically Signed   By: Kristine Garbe M.D.   On: 03/05/2016 17:33   Ct Head Code Stroke W/o Cm  Result Date: 03/05/2016 CLINICAL DATA:  Code stroke.  Slurred speech EXAM: CT HEAD WITHOUT CONTRAST TECHNIQUE: Contiguous axial images were obtained from the base of the skull through the vertex without intravenous contrast. COMPARISON:  CT 08/28/2013 FINDINGS: Brain: Ill-defined hypodensity involving the left insular cortex and the left frontal operculum compatible with subacute infarct. This was not present previously. No hemorrhage. Ill-defined hypodensities in the right frontal lobe over the convexity and in the right parietal lobe also compatible with subacute infarct. Findings suggest cerebral emboli Generalized atrophy with chronic microvascular ischemic change in the white matter. Negative for hemorrhage or mass. Vascular: No hyperdense vessel or unexpected calcification. Skull: Normal. Negative for fracture or focal lesion. Sinuses/Orbits: No acute finding. Other: None ASPECTS ([place] Stroke Program Early CT Score) - Ganglionic level infarction (caudate, lentiform nuclei, internal capsule, insula, M1-M3 cortex): 5 - Supraganglionic infarction (M4-M6 cortex): 1 Total score (0-10 with 10 being normal): 6 IMPRESSION: 1. Bilateral ill-defined low-density areas most compatible with subacute infarctions. The largest is in the left frontal operculum and left insula. No associated  hemorrhage. No mass-effect or midline shift Atrophy and chronic microvascular ischemia 2. ASPECTS is 6 for the left-sided infarct only. The right-sided hypodensities are not scored using the ASPECTS system which is for unilateral infarction. Electronically Signed   By: Franchot Gallo M.D.   On: 03/05/2016 15:31   Procedures Procedures (including critical care time)  Medications Ordered in ED Medications  benazepril (LOTENSIN) tablet 20 mg (not administered)  pantoprazole (PROTONIX) EC tablet 40 mg (not administered)  furosemide (LASIX) tablet 40 mg (not administered)  linagliptin (TRADJENTA) tablet 5 mg (not administered)  budesonide (PULMICORT) nebulizer solution 0.5 mg (not administered)  albuterol (PROVENTIL) (2.5 MG/3ML) 0.083% nebulizer solution 2.5 mg (not administered)  methimazole (TAPAZOLE) tablet 5 mg (not administered)  atorvastatin (LIPITOR) tablet 40 mg (not administered)  magnesium oxide (MAG-OX) tablet 400 mg (not administered)  metoprolol tartrate (LOPRESSOR) tablet 25 mg (not administered)  potassium chloride (K-DUR,KLOR-CON) CR tablet 10 mEq (not administered)  diltiazem (CARDIZEM SR) 12 hr capsule 120 mg (not administered)  artificial tears (LACRILUBE) ophthalmic ointment (not administered)  albuterol (PROVENTIL HFA;VENTOLIN HFA) 108 (90 Base) MCG/ACT inhaler 2 puff (not administered)  aspirin tablet 81 mg (not administered)  dorzolamide-timolol (COSOPT) 22.3-6.8 MG/ML ophthalmic solution 1 drop (not administered)  acetaminophen (TYLENOL) tablet 1,000 mg (not administered)  latanoprost (XALATAN) 0.005 % ophthalmic solution 1 drop (not administered)  Calcium-Magnesium-Vitamin D [id number] MG-MG-UNIT TABS (not administered)  cholecalciferol (VITAMIN D) tablet 1,000 Units (not administered)  enoxaparin (LOVENOX) injection 40 mg (not administered)  acetaminophen (TYLENOL) tablet 650 mg (not administered)    Or  acetaminophen (TYLENOL) suppository 650 mg (not  administered)  traZODone (DESYREL) tablet 25 mg (not administered)  ondansetron (ZOFRAN) tablet 4 mg (not administered)    Or  ondansetron (ZOFRAN) injection 4 mg (not administered)  insulin aspart (novoLOG) injection 0-9 Units (not administered)     Initial Impression / Assessment and Plan / ED Course  I have reviewed the triage vital signs and the nursing notes.  Pertinent labs & imaging results that were available during my care of the patient were reviewed by me and considered in my medical decision making (see chart for details).  Clinical Course   Patient presents for concerns of CVA vs TIA. Patient's deficits include inability to speak. Similar to previous deficits.   Patient determined to not have acute bleed after being taken immediately to CT scan. After consultation and discussion with neurology patient determined not to be a TPA candidate given risk factors presentation and age. I am in agreement the patient is not optimal candidate for TPA and given presentation do not believe endovascular intervention  is warranted  as etiology remains unclear.  Patient does not have vital signs concerning for sepsis or other serious bacterial infection and upon review of screening laboratory work no acute concerns or evidence of urinary tract infection. Patient has no symptoms concerning for meningitis or encephalitis and per endorsement of family of urinary incontinence possibility of seizure like event given patient's gradual return to baseline as all speech difficulties and confusion have resolved at this time. Given patient's risk factors and unclear etiology patient will benefit from admission for further evaluation and workup therefore admission consultation obtained from the hospitalist service who've agreed to admit the patient.  Discussion was had with the admitting service regarding patient's endorsement of fall therefore back exam performed and upon my neurologic exam there is no  concern for cord compression and do not suspect cauda equina or conus medullaris. Patient has rectal tone intact in lower extremities are strong. I obtained CT cervical spine and plain film lumbar spine which will be reviewed by admitting service.  Final Clinical Impressions(s) / ED Diagnoses   Final diagnoses:  Aphasia    New Prescriptions Current Discharge Medication List       Voncille Lo, MD 03/05/16 [place], MD 03/06/16 [phone]

## 2016-03-05 NOTE — Progress Notes (Signed)
Pt arrived from ED with no noted distress. Alert, verbal with no complaints of pain or discomfort. Noted cervical collar in place. Pt oriented to room. Safety measures in place. Call bell within reach. Family at bedside. Will continue to monitor.

## 2016-03-05 NOTE — ED Provider Notes (Signed)
Seen on arrival. Patient a phasic level V caveat acuity of situation. Patient unable to give history. Paramedics report the patient was last seen normal at 1 PM today. She's been unable to speak since 1 PM. In the field patient was moving all extremities. Code stroke called the field. Patient is alert awake. A phasic. Moaning. Moves all extremities. No facial asymmetry.  3:40 PM patient is alert states her name clearly. Speech appears clear she continues to move all extremities.. Neurologist present and does not feel the patient had stroke. He favors encephalopathy .Code stroke canceled. NIH stroke scale was initially calculated at 1  3:55 PM further history from patient's daughter who arrived she lay down for nap approximately 12 noon today. She was normal then. She awakened at 1 PM unable to speak CRITICAL CARE Performed by: Orlie Dakin Total critical care time: 30 minutes Critical care time was exclusive of separately billable procedures and treating other patients. Critical care was necessary to treat or prevent imminent or life-threatening deterioration. Critical care was time spent personally by me on the following activities: development of treatment plan with patient and/or surrogate as well as nursing, discussions with consultants, evaluation of patient's response to treatment, examination of patient, obtaining history from patient or surrogate, ordering and performing treatments and interventions, ordering and review of laboratory studies, ordering and review of radiographic studies, pulse oximetry and re-evaluation of patient's condition.   Orlie Dakin, MD 03/06/16 [phone]

## 2016-03-05 NOTE — H&P (Signed)
History and Physical    Rebecca Skinner [id number] DOB: July 04, 1922 DOA: 03/05/2016  PCP: Kathlene November, MD Patient coming from: home  Chief Complaint: ams  HPI: Rebecca Skinner is a pleasant 82 y.o. female with medical history significant for CAD, diabetes, CVA, hypertension, A. fib, presents to the emergency Department chief complaint of altered mental status. Code stroke called in the field patient evaluated by neurology in the emergency department who opines likely partial seizure code stroke canceled  Information is obtained from the patient and the daughter who is at the bedside. Patient lives with her daughter. Daughter reports patient took a nap after lunch today and when she awakened he was unable to speak. Daughter reports she tried to get patient up and she would not bear weight. Daughter reports symptoms similar to those when patient had CVA. EMS was called. EMS endorsed patient's inability to speak. Associated symptoms include incontinence of urine. She denies any headache dizziness syncope or near-syncope. She denies any chest pain palpitation shortness of breath. She denies any abdominal pain nausea vomiting diarrhea. She denies any fever chills recent illness or sick contacts. Patient did fall 6 weeks ago and has seen her PCP. She's had images of her knee and hip no injury noted.     ED Course: Emergency department patient alert unable to speak follows commands moving all extremities. In the emergency department patient was evaluated by neurology who opined no acute bleed per CT. Patient afebrile hemodynamically stable and not hypoxic  Review of Systems: As per HPI otherwise 10 point review of systems negative.   Ambulatory Status: Ambulates with a walker most recent fall 6 weeks ago without injury  Past Medical History:  Diagnosis Date  . Anxiety 11/20/2011  . Asthma    UNDER [facility] DR PAZ  . CAD (coronary artery disease)    s/p CABG s/p AO valve replacement ([facility])    . CVA (cerebral infarction) 08-2013   multiple, L, d/t Afib, started eloquis  . Diabetes mellitus    type 2   . Dizziness    Chronic, admiet 07-2010,saw neuro, thought to be a peripheral issue   . Dry skin   . GERD (gastroesophageal reflux disease)   . Hyperlipidemia   . Hypertension   . Hyperthyroidism   . Keloid    @ chest  . Memory loss   . Osteoarthritis   . Osteopenia    per dexa 12/09  . Paroxysmal atrial fibrillation ([place])    cards d/c coumadin 12/2008 d/t persisten NSR and frequent falls- restarted coumadin july 2011, now on Eliquis  . Recurrent UTI   . Shortness of breath dyspnea   . TIA (transient ischemic attack)     Past Surgical History:  Procedure Laterality Date  . ABDOMINAL HYSTERECTOMY    . AORTIC VALVE REPLACEMENT  1998  . cagb  1998  . CAROTID DOPPLER  07/13/12   BILATERAL BULB/PROXIMAL ICAS;MILD AMOUNT OF FIBROUS PLAQUE WITH NO DIAMETER REDUCTION.  . CESAREAN SECTION     x 2  . COLONOSCOPY WITH PROPOFOL N/A 03/30/2014   Procedure: COLONOSCOPY WITH PROPOFOL;  Surgeon: Irene Shipper, MD;  Location: [facility];  Service: Endoscopy;  Laterality: N/A;  . CORONARY ARTERY BYPASS GRAFT  1998   SVG TO RCA  . HEMORRHOID SURGERY    . MYOCARDIAL PERFUSION STUDY  12/19/09   NORMAL PATTERN OF PERFUSION IN ALL REGIONS.EF 75%.  . OOPHORECTOMY    . TOTAL KNEE ARTHROPLASTY  1999  .  TRANSTHORACIC ECHOCARDIOGRAM  09/11/11   LVEF >55%.STAGE 1 DIASTOLIC DYSFUNCTION,ELEVATED LV FILLING PRESSURE.BIOPROSTHETIC AORTIC VALVE-PEAK AND MEAN GRADIENTS OF 17 MMHG AND 8 MMHG.SIGMOID SEPTUM.MILD TO MOD MR.MILD TO MOD TR.RVSP 46 MMHG.    Social History   Social History  . Marital status: Divorced    Spouse name: N/A  . Number of children: 4  . Years of education: N/A   Occupational History  . retired  Retired   Social History Main Topics  . Smoking status: Former Research scientist (life sciences)  . Smokeless tobacco: Never Used     Comment: quit 2004, smoked 1.5 ppd  . Alcohol use No  . Drug use: No   . Sexual activity: Not on file   Other Topics Concern  . Not on file   Social History Narrative   Back living at her house since the last visit, GGson lives w/ her (80 y/o)   Had 3 daughter- 2 son  (lost oldest daughter and son), 2 living daughters in [place] Reactions  . Hydrocodone Itching and Nausea Only  . Tramadol Hcl Itching and Nausea Only    Family History  Problem Relation Age of Onset  . Heart attack Father   . Coronary artery disease Son 18    deceased  . Hypertension Brother   . Stroke Brother   . Colon cancer Neg Hx   . Breast cancer Neg Hx   . Thyroid disease Neg Hx     Prior to Admission medications   Medication Sig Start Date End Date Taking? Authorizing Provider  acetaminophen (TYLENOL) 500 MG tablet Take 1,000 mg by mouth every 6 (six) hours as needed for moderate pain (pain).    Historical Provider, MD  albuterol (PROAIR HFA) 108 (90 Base) MCG/ACT inhaler Inhale 2 puffs into the lungs every 6 (six) hours as needed for wheezing or shortness of breath. 06/26/15   Colon Branch, MD  ALPRAZolam Duanne Moron) 0.5 MG tablet Take 1 tablet (0.5 mg total) by mouth at bedtime as needed for anxiety or sleep. 10/16/15   Colon Branch, MD  artificial tears (LACRILUBE) OINT ophthalmic ointment Place into both eyes at bedtime as needed for dry eyes. 09/23/15   Robyn Haber, MD  aspirin 81 MG tablet Take 81 mg by mouth daily.    Historical Provider, MD  atorvastatin (LIPITOR) 40 MG tablet Take 1 tablet (40 mg total) by mouth at bedtime. 11/10/15   Colon Branch, MD  benazepril (LOTENSIN) 20 MG tablet TAKE 1 TABLET BY MOUTH EVERY DAY (NEED OFFICE VISIT FOR MORE REFILLS!!) 02/28/16   Troy Sine, MD  budesonide (PULMICORT) 0.5 MG/2ML nebulizer solution Take 2 mLs (0.5 mg total) by nebulization 2 (two) times daily. 11/10/15   Colon Branch, MD  Calcium Carbonate (CALCIUM 500 PO) Take 500 mg by mouth at bedtime.     Historical Provider, MD  cholecalciferol (VITAMIN D)  1000 UNITS tablet Take 1,000 Units by mouth at bedtime.     Historical Provider, MD  diltiazem (CARDIZEM SR) 120 MG 12 hr capsule Take 1 capsule (120 mg total) by mouth 2 (two) times daily. 10/27/15   Troy Sine, MD  dorzolamide-timolol (COSOPT) 22.3-6.8 MG/ML ophthalmic solution Place 1 drop into both eyes 2 (two) times daily.  03/23/14   Historical Provider, MD  esomeprazole (NEXIUM) 40 MG capsule Take 1 capsule (40 mg total) by mouth daily before breakfast. 01/10/16   Colon Branch, MD  furosemide (LASIX) 20 MG tablet  Take 2 tablets (40 mg total) by mouth daily. 01/10/16   Colon Branch, MD  glucose blood test strip 1 each by Other route as needed for other (blood sugar test). Reported on 11/10/2015    Historical Provider, MD  ketoconazole (NIZORAL) 2 % cream Apply 1 application topically daily. 01/10/16   Colon Branch, MD  Ketoprofen 10 % CREA Apply twice a day to the knee as needed 07/11/15   Colon Branch, MD  levalbuterol Penne Lash) 0.63 MG/3ML nebulizer solution Take 3 mLs (0.63 mg total) by nebulization 2 (two) times daily. 11/10/15   Colon Branch, MD  magnesium oxide (MAG-OX) 400 (241.3 Mg) MG tablet Take 1 tablet (400 mg total) by mouth daily. 11/10/15   Colon Branch, MD  methimazole (TAPAZOLE) 5 MG tablet Take 1 tablet (5 mg total) by mouth 3 (three) times a week. 11/10/15   Renato Shin, MD  metoprolol tartrate (LOPRESSOR) 25 MG tablet Take 1 tablet (25 mg total) by mouth 3 (three) times daily. 11/10/15   Colon Branch, MD  nitroGLYCERIN (NITROSTAT) 0.4 MG SL tablet Place 0.4 mg under the tongue every 5 (five) minutes as needed for chest pain (chest pain). Reported on 11/10/2015    Historical Provider, MD  potassium chloride (KLOR-CON M10) 10 MEQ tablet Take 1 tablet (10 mEq total) by mouth daily. 11/10/15   Colon Branch, MD  sitaGLIPtin (JANUVIA) 50 MG tablet Take 1 tablet (50 mg total) by mouth daily. 01/10/16   Colon Branch, MD  TRAVATAN Z 0.004 % SOLN ophthalmic solution Place 1 drop into both eyes at bedtime.  06/16/13    Historical Provider, MD    Physical Exam: Vitals:   03/05/16 1600 03/05/16 1615 03/05/16 1630 03/05/16 1645  BP: 170/93 181/74 180/86 (!) 204/79  Pulse: 64 60 (!) 58 61  Resp: 22 17 (!) 31 22  SpO2: 100% 99% 98% 99%  Height:         General:  Appears calm and comfortable Eyes:  PERRL, EOMI, normal lids, iris ENT:  grossly normal hearing, lips & tongue, His membranes of her mouth are moist and pink Neck:  no LAD, masses or thyromegaly Cardiovascular:  RRR, no m/r/g. No LE edema.  Respiratory:  CTA bilaterally, no w/r/r. Normal respiratory effort. Abdomen:  soft, ntnd, positive bowel sounds no guarding or rebounding Skin:  no rash or induration seen on limited exam Musculoskeletal:  grossly normal tone BUE/BLE, good ROM, no bony abnormality Psychiatric:  grossly normal mood and affect, speech fluent and appropriate, AOx3 Neurologic:  CN 2-12 grossly intact, moves all extremities in coordinated fashion, sensation intact bilateral grip 5 out of 5 lower extremity strength 5 out of 5  Labs on Admission: I have personally reviewed following labs and imaging studies  CBC:  Recent Labs Lab 03/05/16 1508 03/05/16 1523  WBC 8.6  --   NEUTROABS 5.1  --   HGB 13.3 14.3  HCT 42.1 42.0  MCV 100.2*  --   PLT 164  --    Basic Metabolic Panel:  Recent Labs Lab 03/05/16 1508 03/05/16 1523  NA 140 140  K 4.3 4.4  CL 105 102  CO2 24  --   GLUCOSE 111* 109*  BUN 16 19  CREATININE 1.59* 1.50*  CALCIUM 9.3  --    GFR: CrCl cannot be calculated (Unknown ideal weight.). Liver Function Tests:  Recent Labs Lab 03/05/16 1508  AST 19  ALT 15  ALKPHOS 83  BILITOT 0.6  PROT 6.4*  ALBUMIN 3.6   No results for input(s): LIPASE, AMYLASE in the last 168 hours. No results for input(s): AMMONIA in the last 168 hours. Coagulation Profile:  Recent Labs Lab 03/05/16 1508  INR 1.26   Cardiac Enzymes: No results for input(s): CKTOTAL, CKMB, CKMBINDEX, TROPONINI in the last 168  hours. BNP (last 3 results)  Recent Labs  07/26/15 1558  PROBNP 512.0*   HbA1C: No results for input(s): HGBA1C in the last 72 hours. CBG:  Recent Labs Lab 03/05/16 1506  GLUCAP 106*   Lipid Profile: No results for input(s): CHOL, HDL, LDLCALC, TRIG, CHOLHDL, LDLDIRECT in the last 72 hours. Thyroid Function Tests: No results for input(s): TSH, T4TOTAL, FREET4, T3FREE, THYROIDAB in the last 72 hours. Anemia Panel: No results for input(s): VITAMINB12, FOLATE, FERRITIN, TIBC, IRON, RETICCTPCT in the last 72 hours. Urine analysis:    Component Value Date/Time   COLORURINE YELLOW 03/05/2016 1540   APPEARANCEUR HAZY (A) 03/05/2016 1540   LABSPEC 1.019 03/05/2016 1540   PHURINE 5.5 03/05/2016 1540   GLUCOSEU NEGATIVE 03/05/2016 1540   GLUCOSEU NEGATIVE 02/16/2013 1058   HGBUR NEGATIVE 03/05/2016 1540   HGBUR negative 12/18/2009 0937   BILIRUBINUR NEGATIVE 03/05/2016 1540   BILIRUBINUR Neg 11/20/2011 1458   KETONESUR NEGATIVE 03/05/2016 1540   PROTEINUR 30 (A) 03/05/2016 1540   UROBILINOGEN 0.2 12/20/2014 2204   NITRITE NEGATIVE 03/05/2016 1540   LEUKOCYTESUR SMALL (A) 03/05/2016 1540    Creatinine Clearance: CrCl cannot be calculated (Unknown ideal weight.).  Sepsis Labs: @LABRCNTIP (procalcitonin:4,lacticidven:4) )No results found for this or any previous visit (from the past 240 hour(s)).   Radiological Exams on Admission: Ct Head Code Stroke W/o Cm  Result Date: 03/05/2016 CLINICAL DATA:  Code stroke.  Slurred speech EXAM: CT HEAD WITHOUT CONTRAST TECHNIQUE: Contiguous axial images were obtained from the base of the skull through the vertex without intravenous contrast. COMPARISON:  CT 08/28/2013 FINDINGS: Brain: Ill-defined hypodensity involving the left insular cortex and the left frontal operculum compatible with subacute infarct. This was not present previously. No hemorrhage. Ill-defined hypodensities in the right frontal lobe over the convexity and in the right  parietal lobe also compatible with subacute infarct. Findings suggest cerebral emboli Generalized atrophy with chronic microvascular ischemic change in the white matter. Negative for hemorrhage or mass. Vascular: No hyperdense vessel or unexpected calcification. Skull: Normal. Negative for fracture or focal lesion. Sinuses/Orbits: No acute finding. Other: None ASPECTS ([place]outs Stroke Program Early CT Score) - Ganglionic level infarction (caudate, lentiform nuclei, internal capsule, insula, M1-M3 cortex): 5 - Supraganglionic infarction (M4-M6 cortex): 1 Total score (0-10 with 10 being normal): 6 IMPRESSION: 1. Bilateral ill-defined low-density areas most compatible with subacute infarctions. The largest is in the left frontal operculum and left insula. No associated hemorrhage. No mass-effect or midline shift Atrophy and chronic microvascular ischemia 2. ASPECTS is 6 for the left-sided infarct only. The right-sided hypodensities are not scored using the ASPECTS system which is for unilateral infarction. Electronically Signed   By: Franchot Gallo M.D.   On: 03/05/2016 15:31    EKG: Independently reviewed. Atrial fibrillation Abnormal ECG No significant change since last tracing  Assessment/Plan Principal Problem:   Altered mental status Active Problems:   Diabetes mellitus with circulatory complication (HCC)   Essential hypertension   CAD (coronary artery disease) of artery bypass graft   Asthma, chronic   Chronic atrial fibrillation (HCC)   Chronic diastolic heart failure ([place])   Acute kidney injury ([place])   #1. Altered mental  status/acute neuropathy. Etiology uncertain. Symptoms resolved at point of admission. Evaluated by neurology who opined CT reveals chronic ischemic infarctions without any evidence of obvious acute ischemia per his note. Commended admission for acute encephalopathy as some concern for seizures/partial seizure. No focal deficits at point of admission no metabolic derangements  with the exception of mild acute kidney injury, no signs of infection. -Admit to telemetry -Gentle IV fluids -Hold sedating medications -Follow EEG ordered by neurology -Obtain a B-12 folate RPR  #2. Acute kidney injury. Etiology unclear. Creatinine 1.5 on admission -Hold nephrotoxins -Gentle IV fluids -Monitor urine output -Recheck in the morning  #3. Hypertension. Controlled in the emergency department. -Continue home medications  #4. A. Fib. Chart review indicates formally on Coumadin which was changed L a close after her stroke in 2015 this was discontinued due to GI bleed on aspirin 81 mg. [place] score 4.   5. Chronic diastolic heart failure. Compensated. Echo done in 2016 reveals an EF of [id number] grade 2 diastolic dysfunction -Continue home meds -Weights -Intake and output  #6. Diabetes. On oral agents at home. Serum glucose 111 on admission -Obtain a hemoglobin A1c -Sliding scale insulin for optimal control  #7. CAD. Status post CABG. Denies chest pain. EKG as noted above. -tele -continue home med  #8. Asthma. Stable at baseline. Chest x-ray pending. Saturation level greater than 90% on room air -Continue home nebulizers    DVT prophylaxis: lovenox Code Status: full  Family Communication: daughter at bedside  Disposition Plan: home   Consults called: oster  Admission status: obs    Dyanne Carrel M MD [facility]  If 7PM-7AM, please contact night-coverage [email or web address] Password [facility]  03/05/2016, 5:28 PM

## 2016-03-06 ENCOUNTER — Observation Stay (HOSPITAL_BASED_OUTPATIENT_CLINIC_OR_DEPARTMENT_OTHER)
Admit: 2016-03-06 | Discharge: 2016-03-06 | Disposition: A | Discharge status: Home / Self Care | Payer: Medicare Other | Attending: Internal Medicine | Admitting: Internal Medicine

## 2016-03-06 DIAGNOSIS — I25810 Atherosclerosis of coronary artery bypass graft(s) without angina pectoris: Secondary | ICD-10-CM

## 2016-03-06 DIAGNOSIS — G9340 Encephalopathy, unspecified: Secondary | ICD-10-CM | POA: Diagnosis not present

## 2016-03-06 DIAGNOSIS — N179 Acute kidney failure, unspecified: Secondary | ICD-10-CM

## 2016-03-06 DIAGNOSIS — E1159 Type 2 diabetes mellitus with other circulatory complications: Secondary | ICD-10-CM

## 2016-03-06 DIAGNOSIS — I5032 Chronic diastolic (congestive) heart failure: Secondary | ICD-10-CM

## 2016-03-06 DIAGNOSIS — R4182 Altered mental status, unspecified: Secondary | ICD-10-CM | POA: Diagnosis not present

## 2016-03-06 DIAGNOSIS — J4540 Moderate persistent asthma, uncomplicated: Secondary | ICD-10-CM | POA: Diagnosis not present

## 2016-03-06 DIAGNOSIS — I482 Chronic atrial fibrillation: Secondary | ICD-10-CM

## 2016-03-06 LAB — CBC
HEMATOCRIT: 37.7 % (ref 36.0–46.0)
Hemoglobin: 12.1 g/dL (ref 12.0–15.0)
MCH: 31.8 pg (ref 26.0–34.0)
MCHC: 32.1 g/dL (ref 30.0–36.0)
MCV: 99.2 fL (ref 78.0–100.0)
Platelets: 178 10*3/uL (ref 150–400)
RBC: 3.8 MIL/uL — ABNORMAL LOW (ref 3.87–5.11)
RDW: 13.4 % (ref 11.5–15.5)
WBC: 7.7 10*3/uL (ref 4.0–10.5)

## 2016-03-06 LAB — BASIC METABOLIC PANEL
Anion gap: 8 (ref 5–15)
BUN: 13 mg/dL (ref 6–20)
CHLORIDE: 105 mmol/L (ref 101–111)
CO2: 27 mmol/L (ref 22–32)
Calcium: 9 mg/dL (ref 8.9–10.3)
Creatinine, Ser: 0.98 mg/dL (ref 0.44–1.00)
GFR calc Af Amer: 57 mL/min — ABNORMAL LOW (ref >60)
GFR calc non Af Amer: 49 mL/min — ABNORMAL LOW (ref >60)
GLUCOSE: 109 mg/dL — AB (ref 65–99)
POTASSIUM: 3.8 mmol/L (ref 3.5–5.1)
Sodium: 140 mmol/L (ref 135–145)

## 2016-03-06 LAB — URINE CULTURE: Special Requests: NORMAL

## 2016-03-06 LAB — GLUCOSE, CAPILLARY
GLUCOSE-CAPILLARY: 114 mg/dL — AB (ref 65–99)
Glucose-Capillary: 116 mg/dL — ABNORMAL HIGH (ref 65–99)

## 2016-03-06 LAB — VITAMIN B12: VITAMIN B 12: 853 pg/mL (ref 180–914)

## 2016-03-06 NOTE — Care Management Obs Status (Signed)
[facility] NOTIFICATION   Patient Details  Name: Rebecca Skinner MRN: [id number] Date of Birth: 1922-10-06   Medicare Observation Status Notification Given:  Yes    Pollie Friar, RN 03/06/2016, 2:08 PM

## 2016-03-06 NOTE — Progress Notes (Signed)
EEG Completed; Results Pending  

## 2016-03-06 NOTE — Procedures (Signed)
ELECTROENCEPHALOGRAM REPORT  Date of Study: 03/06/2016  Patient's Name: Rebecca Skinner MRN: [id number] Date of Birth: 03-07-1923  Referring Provider: Dr. Estill Cotta  Clinical History: This is a 82 year old woman with acute encephalopathy.  Medications: acetaminophen (TYLENOL) tablet 1,000 mg  aspirin chewable tablet 81 mg  atorvastatin (LIPITOR) tablet 40 mg  benazepril (LOTENSIN) tablet 20 mg  budesonide (PULMICORT) nebulizer solution 0.5 mg  calcium-vitamin D (OSCAL WITH D) 500-200 MG-UNIT per tablet 1 tablet  cholecalciferol (VITAMIN D) tablet 1,000 Units  diltiazem (CARDIZEM SR) 12 hr capsule 120 mg  enoxaparin (LOVENOX) injection 40 mg  furosemide (LASIX) tablet 40 mg  insulin aspart (novoLOG) injection 0-9 Units  latanoprost (XALATAN) 0.005 % ophthalmic solution 1 drop  linagliptin (TRADJENTA) tablet 5 mg  magnesium oxide (MAG-OX) tablet 400 mg  methimazole (TAPAZOLE) tablet 5 mg  metoprolol tartrate (LOPRESSOR) tablet 25 mg  morphine 2 MG/ML injection 2 mg  pantoprazole (PROTONIX) EC tablet 40 mg  traZODone (DESYREL) tablet 25 mg   Technical Summary: A multichannel digital EEG recording measured by the international 10-20 system with electrodes applied with paste and impedances below 5000 ohms performed in our laboratory with EKG monitoring in an awake and drowsy patient.  Hyperventilation and photic stimulation were not performed.  The digital EEG was referentially recorded, reformatted, and digitally filtered in a variety of bipolar and referential montages for optimal display.    Description: The patient is awake and drowsy during the recording.  During maximal wakefulness, there is a symmetric, medium voltage 8 Hz posterior dominant rhythm that attenuates with eye opening.  The record is symmetric.  During drowsiness, there is an increase in theta and delta slowing of the background, at times with shifting asymmetry over the bilateral temporal regions. Deeper stages of  sleep were not seen. Hyperventilation and photic stimulation did not elicit any abnormalities.  There were no epileptiform discharges or electrographic seizures seen.    EKG lead showed irregular rhythm with occasional extrasystolic beats.  Impression: This awake and drowsy EEG is within normal limits for age.  Clinical Correlation: A normal EEG does not exclude a clinical diagnosis of epilepsy. Clinical correlation is advised.   Ellouise Newer, M.D.

## 2016-03-06 NOTE — Care Management Note (Signed)
Case Management Note  Patient Details  Name: Rebecca Skinner MRN: [id number] Date of Birth: 1922-06-20  Subjective/Objective:                    Action/Plan: Pt discharging home with daughter. No further needs per CM.   Expected Discharge Date:                  Expected Discharge Plan:  Home/Self Care  In-House Referral:     Discharge planning Services     Post Acute Care Choice:    Choice offered to:     DME Arranged:    DME Agency:     HH Arranged:    [place] Agency:     Status of Service:  Completed, signed off  If discussed at [employer] of Stay Meetings, dates discussed:    Additional Comments:  Pollie Friar, RN 03/06/2016, 2:21 PM

## 2016-03-06 NOTE — Progress Notes (Signed)
Discharge order received. RN discussed discharge instructions with patient and daughter, daughter verbalized understanding of d/c instructins including f/u appt in PCP (scheduled oct 11 at 11:30AM), no changes to medications, increase activity slowly. Tele removed, IV removed. Patient to be escorted to car by wheelchair. Neuro assessment unchanged

## 2016-03-06 NOTE — Discharge Summary (Signed)
Physician Discharge Summary   Patient ID: Rebecca Skinner MRN: [id number] DOB/AGE: 1923/04/04 82 y.o.  Admit date: 03/05/2016 Discharge date: 03/06/2016  Primary Care Physician:  Rebecca November, MD  Discharge Diagnoses:    .Acute encephalopathy, likely metabolic encephalopathy . Acute kidney injury ([place]) . CAD (coronary artery disease) of artery bypass graft . Chronic atrial fibrillation ([place]) . Asthma, chronic . Chronic diastolic heart failure ([place]) . Diabetes mellitus with circulatory complication ([place]) . Essential hypertension   Consults:  Neurology  Recommendations for Outpatient Follow-up:  1. Please repeat CBC/BMET at next visit  DIET: Carb modified diet    Allergies:   Allergies  Allergen Reactions  . Hydrocodone Itching and Nausea Only  . Tramadol Hcl Itching and Nausea Only     DISCHARGE MEDICATIONS: Current Discharge Medication List    CONTINUE these medications which have NOT CHANGED   Details  acetaminophen (TYLENOL) 500 MG tablet Take 1,000 mg by mouth every 6 (six) hours as needed for moderate pain (pain).    albuterol (PROAIR HFA) 108 (90 Base) MCG/ACT inhaler Inhale 2 puffs into the lungs every 6 (six) hours as needed for wheezing or shortness of breath. Qty: 18 g, Refills: 6    ALPRAZolam (XANAX) 0.5 MG tablet Take 1 tablet (0.5 mg total) by mouth at bedtime as needed for anxiety or sleep. Qty: 30 tablet, Refills: 3    artificial tears (LACRILUBE) OINT ophthalmic ointment Place into both eyes at bedtime as needed for dry eyes. Qty: 3.5 g, Refills: 11   Associated Diagnoses: Exposure keratitis    aspirin 81 MG tablet Take 81 mg by mouth daily.    atorvastatin (LIPITOR) 40 MG tablet Take 1 tablet (40 mg total) by mouth at bedtime. Qty: 90 tablet, Refills: 1    benazepril (LOTENSIN) 20 MG tablet TAKE 1 TABLET BY MOUTH EVERY DAY (NEED OFFICE VISIT FOR MORE REFILLS!!) Qty: 90 tablet, Refills: 0    budesonide (PULMICORT) 0.5 MG/2ML nebulizer solution  Take 2 mLs (0.5 mg total) by nebulization 2 (two) times daily. Qty: 120 mL, Refills: 5   Associated Diagnoses: Asthma, chronic, unspecified asthma severity, uncomplicated    Calcium Carbonate (CALCIUM 500 PO) Take 500 mg by mouth at bedtime.     cholecalciferol (VITAMIN D) 1000 UNITS tablet Take 1,000 Units by mouth at bedtime.     diltiazem (CARDIZEM SR) 120 MG 12 hr capsule Take 1 capsule (120 mg total) by mouth 2 (two) times daily. Qty: 30 capsule, Refills: 6    dorzolamide-timolol (COSOPT) 22.3-6.8 MG/ML ophthalmic solution Place 1 drop into both eyes 2 (two) times daily.     esomeprazole (NEXIUM) 40 MG capsule Take 1 capsule (40 mg total) by mouth daily before breakfast. Qty: 90 capsule, Refills: 1    furosemide (LASIX) 20 MG tablet Take 2 tablets (40 mg total) by mouth daily. Qty: 180 tablet, Refills: 1    glucose blood test strip 1 each by Other route as needed for other (blood sugar test). Reported on 11/10/2015    ketoconazole (NIZORAL) 2 % cream Apply 1 application topically daily. Qty: 30 g, Refills: 0    Ketoprofen 10 % CREA Apply twice a day to the knee as needed Qty: 120 g, Refills: 2    levalbuterol (XOPENEX) 0.63 MG/3ML nebulizer solution Take 3 mLs (0.63 mg total) by nebulization 2 (two) times daily. Qty: 180 mL, Refills: 5   Associated Diagnoses: Asthma, chronic, unspecified asthma severity, uncomplicated    magnesium oxide (MAG-OX) 400 (241.3 Mg) MG tablet  Take 1 tablet (400 mg total) by mouth daily. Qty: 90 tablet, Refills: 1    methimazole (TAPAZOLE) 5 MG tablet Take 1 tablet (5 mg total) by mouth 3 (three) times a week. Qty: 40 tablet, Refills: 0    metoprolol tartrate (LOPRESSOR) 25 MG tablet Take 1 tablet (25 mg total) by mouth 3 (three) times daily. Qty: 270 tablet, Refills: 1    nitroGLYCERIN (NITROSTAT) 0.4 MG SL tablet Place 0.4 mg under the tongue every 5 (five) minutes as needed for chest pain (chest pain). Reported on 11/10/2015    potassium  chloride (KLOR-CON M10) 10 MEQ tablet Take 1 tablet (10 mEq total) by mouth daily. Qty: 90 tablet, Refills: 1    sitaGLIPtin (JANUVIA) 50 MG tablet Take 1 tablet (50 mg total) by mouth daily. Qty: 90 tablet, Refills: 1    TRAVATAN Z 0.004 % SOLN ophthalmic solution Place 1 drop into both eyes at bedtime.          Brief H and P: For complete details please refer to admission H and P, but in brief Rebecca Skinner a pleasant 82 y.o.femalewith medical history significant for CAD, diabetes, CVA, hypertension, A. fib, presents to the emergency Department chief complaint of altered mental status. Code stroke called in the field patient evaluated by neurology in the emergency department who opines likely partial seizure code stroke canceled Information is obtained from the patient and the daughter who was at the bedside. Patient lives with her daughter. Daughter reports patient took a nap after lunch today and when she awakened, she was unable to speak. Daughter reported she tried to get patient up and she would not bear weight. Daughter reported symptoms similar to those when patient had CVA. EMS was called. EMS endorsed patient's inability to speak. Associated symptoms included incontinence of urine. She denied any headache, dizziness syncope or near-syncope. She deniedany chest pain palpitation shortness of breath. She denies any abdominal pain nausea vomiting diarrhea. She denied any fever chills recent illness or sick contacts. Patient did fall 6 weeks ago and had seen her PCP.    Hospital Course:   Acute encephalopathy  - Significantly improved, appears to be close to baseline, etiology unclear, possibly due to mild acute kidney injury causing metabolic encephalopathy. - Neurology was consulted. CT of the head as a code stroke showed ischemic infarctions without any evidence of obvious acute ischemia per his note. Patient was recommended admission for acute encephalopathy due to concern for  possible partial seizure or metabolic encephalopathy. Patient had no seizure activity during hospitalization. She was not started on any antiseizure medications by neurology. - EEG was negative for acute seizures. Currently alert and oriented, appears close to her baseline - Troponins negative, hemoglobin 12.1, TSH 1.2, UDS negative, follow B12, pending - Ammonia level slightly elevated at 56, however no history of liver disease, currently mental status close to baseline, alert and oriented - PT evaluation showed patient is at baseline and does not need any PT.   Acute kidney injury. Etiology unclear. Creatinine 1.5 on admission - Patient was placed on gentle IV fluid hydration, creatinine improved to 0.9  -Hold nephrotoxins  Hypertension. Improving -Continue home medications  A. Fib. Chart review indicates formally on Coumadin which was changed L a close after her stroke in 2015 this was discontinued due to GI bleed on aspirin 81 mg. [place] score 4.    Chronic diastolic heart failure. Compensated. Echo done in 2016 reveals an EF of 123456 grade 2 diastolic dysfunction -  Continue home meds  Diabetes. On oral agents at home. Serum glucose 111 on admission  CAD. Status post CABG. Denies chest pain.  - Troponins negative  Asthma. Stable at baseline. Chest x-ray pending. Saturation level greater than 90% on room air -Continue home nebulizers   Day of Discharge BP 130/89 (BP Location: Left Arm)   Pulse 71   Temp 98.2 F (36.8 C) (Oral)   Resp 18   Ht 5\' 1"  (1.549 m)   Wt 78.4 kg (172 lb 14.4 oz)   SpO2 97%   BMI 32.67 kg/m   Physical Exam: General: Alert and awake oriented x3 not in any acute distress. HEENT: anicteric sclera, pupils reactive to light and accommodation CVS: S1-S2 clear no murmur rubs or gallops Chest: clear to auscultation bilaterally, no wheezing rales or rhonchi Abdomen: soft nontender, nondistended, normal bowel sounds Extremities: no cyanosis, clubbing  or edema noted bilaterally Neuro: Cranial nerves II-XII intact, no focal neurological deficits   The results of significant diagnostics from this hospitalization (including imaging, microbiology, ancillary and laboratory) are listed below for reference.    LAB RESULTS: Basic Metabolic Panel:  Recent Labs Lab 03/05/16 1508 03/05/16 1523 03/06/16 0625  NA 140 140 140  K 4.3 4.4 3.8  CL 105 102 105  CO2 24  --  27  GLUCOSE 111* 109* 109*  BUN 16 19 13   CREATININE 1.59* 1.50* 0.98  CALCIUM 9.3  --  9.0   Liver Function Tests:  Recent Labs Lab 03/05/16 1508  AST 19  ALT 15  ALKPHOS 83  BILITOT 0.6  PROT 6.4*  ALBUMIN 3.6   No results for input(s): LIPASE, AMYLASE in the last 168 hours.  Recent Labs Lab 03/05/16 1858  AMMONIA 56*   CBC:  Recent Labs Lab 03/05/16 1508 03/05/16 1523 03/06/16 0625  WBC 8.6  --  7.7  NEUTROABS 5.1  --   --   HGB 13.3 14.3 12.1  HCT 42.1 42.0 37.7  MCV 100.2*  --  99.2  PLT 164  --  178   Cardiac Enzymes: No results for input(s): CKTOTAL, CKMB, CKMBINDEX, TROPONINI in the last 168 hours. BNP: Invalid input(s): POCBNP CBG:  Recent Labs Lab 03/06/16 0639 03/06/16 1222  GLUCAP 116* 114*    Significant Diagnostic Studies:  No results found.  2D ECHO:   Disposition and Follow-up: Discharge Instructions    Diet Carb Modified    Complete by:  As directed    Increase activity slowly    Complete by:  As directed        DISPOSITION: [place], MD. Schedule an appointment as soon as possible for a visit in 2 week(s).   Specialty:  Internal Medicine Contact information: [address] [place] [address] [phone]            Time spent on Discharge: 25 minutes  Signed:   Anjeanette Petzold M.D. [facility] 03/06/2016, 1:05 PM Pager: [phone]

## 2016-03-06 NOTE — Progress Notes (Signed)
[facility]                                                                              Patient Demographics  Rebecca Skinner, is a 82 y.o. female, DOB - [birth year], [id number]  Admit date - 03/05/2016   Admitting Physician Waldemar Dickens, MD  Outpatient Primary MD for the patient is Kathlene November, MD  Outpatient specialists:   LOS - 0  days    Chief Complaint  Patient presents with  . Code Stroke       Brief summary    Rebecca Skinner is a pleasant 82 y.o. female with medical history significant for CAD, diabetes, CVA, hypertension, A. fib, presents to the emergency Department chief complaint of altered mental status. Code stroke called in the field patient evaluated by neurology in the emergency department who opines likely partial seizure code stroke canceled Information is obtained from the patient and the daughter who was at the bedside. Patient lives with her daughter. Daughter reports patient took a nap after lunch today and when she awakened, she was unable to speak. Daughter reported she tried to get patient up and she would not bear weight. Daughter reported symptoms similar to those when patient had CVA. EMS was called. EMS endorsed patient's inability to speak. Associated symptoms included incontinence of urine. She denied any headache, dizziness syncope or near-syncope. She deniedany chest pain palpitation shortness of breath. She denies any abdominal pain nausea vomiting diarrhea. She denied any fever chills recent illness or sick contacts. Patient did fall 6 weeks ago and had seen her PCP.    Assessment & Plan   Acute encephalopathy  - Significantly improved, appears to be close to baseline, etiology unclear. - Neurology was consulted. CT of the head as a code stroke showed ischemic infarctions without any evidence of obvious acute ischemia per his note. Patient was recommended admission for acute encephalopathy due to and son for partial seizure or metabolic  encephalopathy.  - EEG pending. Currently alert and oriented, appears close to her baseline - Troponins negative, hemoglobin 12.1, TSH 1.2, UDS negative, follow B12 - Ammonia level slightly elevated at 56, however no history of liver disease, currently mental status close to baseline, alert and oriented   Acute kidney injury. Etiology unclear. Creatinine 1.5 on admission - Patient was placed on gentle IV fluid hydration, creatinine improved to 0.9 today -Hold nephrotoxins  Hypertension. Improving -Continue home medications  A. Fib. Chart review indicates formally on Coumadin which was changed L a close after her stroke in 2015 this was discontinued due to GI bleed on aspirin 81 mg. [place] score 4.    Chronic diastolic heart failure. Compensated. Echo done in 2016 reveals an EF of 123456 grade 2 diastolic dysfunction -Continue home meds  Diabetes. On oral agents at home. Serum glucose 111 on admission -Obtain a hemoglobin A1c -Sliding scale insulin for optimal control  CAD. Status post CABG. Denies chest pain.  - Troponins negative   Asthma. Stable at baseline. Chest x-ray pending. Saturation level greater than 90% on room air -Continue home nebulizers   Code Status:FC DVT Prophylaxis:  Lovenox  Family  Communication: Discussed in detail with the patient, all imaging results, lab results explained to the patient or   Disposition Plan: EEG pending, likely DC home in a.m.  Time Spent in minutes 25 minutes  Procedures:    Consultants:   Neurology  Antimicrobials:      Medications  Scheduled Meds: . albuterol  2.5 mg Nebulization TID  . aspirin  81 mg Oral Daily  . atorvastatin  40 mg Oral QHS  . benazepril  20 mg Oral Daily  . budesonide  0.5 mg Nebulization BID  . calcium-vitamin D  1 tablet Oral Q supper  . cholecalciferol  1,000 Units Oral QHS  . diltiazem  120 mg Oral BID  . dorzolamide  1 drop Both Eyes BID   Or  . timolol  1 drop Both Eyes BID  .  enoxaparin (LOVENOX) injection  40 mg Subcutaneous Q24H  . furosemide  40 mg Oral Daily  . insulin aspart  0-9 Units Subcutaneous TID WC  . latanoprost  1 drop Both Eyes QHS  . linagliptin  5 mg Oral Daily  . magnesium oxide  400 mg Oral Daily  . methimazole  5 mg Oral Once per day on Mon Wed Fri  . metoprolol tartrate  25 mg Oral TID  . pantoprazole  40 mg Oral Daily  . potassium chloride  10 mEq Oral Daily   Continuous Infusions:  PRN Meds:.acetaminophen **OR** acetaminophen, acetaminophen, albuterol, artificial tears, morphine injection, ondansetron **OR** ondansetron (ZOFRAN) IV, traZODone   Antibiotics   Anti-infectives    None        Subjective:   [name] was seen and examined today.  Patient denies dizziness, chest pain, shortness of breath, abdominal pain, N/V/D/C, new weakness, numbess, tingling. No acute events overnight.    Objective:   Vitals:   03/06/16 0545 03/06/16 0919 03/06/16 0923 03/06/16 0958  BP: (!) 195/99   130/89  Pulse: 81   71  Resp: 18   18  Temp: 98.8 F (37.1 C)   98.2 F (36.8 C)  TempSrc: Oral   Oral  SpO2: 96% 98% 100% 97%  Weight:      Height:       No intake or output data in the 24 hours ending 03/06/16 1254   Wt Readings from Last 3 Encounters:  03/06/16 78.4 kg (172 lb 14.4 oz)  02/20/16 77.7 kg (171 lb 6 oz)  02/02/16 79.4 kg (175 lb)     Exam  General: Alert and oriented x2, NAD  HEENT:  PERRLA, EOMI, Anicteric Sclera, mucous membranes moist.   Neck: Supple, no JVD, no masses  Cardiovascular: S1 S2 auscultated, no rubs, murmurs or gallops. Regular rate and rhythm.  Respiratory: Clear to auscultation bilaterally, no wheezing, rales or rhonchi  Gastrointestinal: Soft, nontender, nondistended, + bowel sounds  Ext: no cyanosis clubbing or edema  Neuro: AAOx3, Cr N's II- XII. Strength 5/5 upper and lower extremities bilaterally  Skin: No rashes  Psych: Normal affect and demeanor, alert and oriented   Data  Reviewed:  I have personally reviewed following labs and imaging studies  Micro Results Recent Results (from the past 240 hour(s))  Gram stain     Status: None   Collection Time: 03/05/16  3:40 PM  Result Value Ref Range Status   Specimen Description URINE, CLEAN CATCH  Final   Special Requests NONE  Final   Gram Stain   Final    WBC PRESENT,BOTH PMN AND MONONUCLEAR GRAM POSITIVE COCCI GRAM  NEGATIVE RODS GRAM POSITIVE RODS GRAM VARIABLE ROD CYTOSPIN SMEAR    Report Status 03/05/2016 FINAL  Final    Radiology Reports Dg Lumbar Spine 2-3 Views  Result Date: 03/05/2016 CLINICAL DATA:  Midline spine pain EXAM: LUMBAR SPINE - 2-3 VIEW COMPARISON:  Lumbar spine radiograph 03/26/2013 FINDINGS: There is multilevel intervertebral disc space narrowing, greatest at L4-5 and L2-3. Unchanged from the prior study. There is lower lumbar facet arthrosis. No static subluxation. No acute fracture. There is extensive atherosclerotic calcification within the abdominal aorta. Mild multilevel lower thoracic height loss, unchanged. IMPRESSION: 1. No acute fracture or static subluxation of the lumbar spine. 2. Advanced degenerative disc disease, greatest at L2-3 and L4-5. 3. Aortic atherosclerosis. Electronically Signed   By: Ulyses Jarred M.D.   On: 03/05/2016 17:50   Ct Cervical Spine Wo Contrast  Result Date: 03/05/2016 CLINICAL DATA:  82 y/o F; patient found lying and bled without eyes open unable to speak. History of falling and unsteady gait. EXAM: CT CERVICAL SPINE WITHOUT CONTRAST TECHNIQUE: Multidetector CT imaging of the cervical spine was performed without intravenous contrast. Multiplanar CT image reconstructions were also generated. COMPARISON:  08/27/2013 CT angiogram of the neck.  High FINDINGS: Alignment: Straightening of cervical lordosis. Stable minimal C3-4 anterolisthesis. Skull base and vertebrae: No acute fracture. No primary bone lesion or focal pathologic process. Soft tissues and spinal  canal: No prevertebral fluid or swelling. No visible canal hematoma. Multinodular thyroid goiter. Otherwise no discrete mass or cervical lymphadenopathy is identified on this noncontrast CT. Patent aerodigestive tract. Calcifications of the carotid bifurcations. Mildly patulous esophagus in the lower neck. Disc levels: Moderate cervical spondylosis with endplate degenerative changes and disc space narrowing greatest from C4 the T1. Multilevel marginal osteophytes, uncovertebral hypertrophy, and facet hypertrophy. No high-grade bony canal stenosis. Bilateral C3-4 and left C5-6 moderate foraminal narrowing. No significant interval change. Upper chest: Negative. Other: Negative. IMPRESSION: No acute fracture or malalignment of cervical spine identified. Stable moderate cervical spondylosis. Electronically Signed   By: Kristine Garbe M.D.   On: 03/05/2016 17:33   Dg Chest Port 1 View  Result Date: 03/05/2016 CLINICAL DATA:  Altered mental status.  Possible stroke. EXAM: PORTABLE CHEST 1 VIEW COMPARISON:  09/01/2015 FINDINGS: Stable enlargement of cardiac silhouette with median sternotomy wires. Both lungs are clear. Stable linear density in the left lower chest is probably related to scarring. Few linear densities in the right lung are suggestive for atelectasis. No large pleural effusions. Negative for a pneumothorax. No acute bone abnormality. IMPRESSION: Mild atelectasis in the right lung.- Electronically Signed   By: Markus Daft M.D.   On: 03/05/2016 19:49   Dg Hip Unilat With Pelvis 2-3 Views Right  Result Date: 02/20/2016 CLINICAL DATA:  Trauma 2 weeks ago with right hip pain. EXAM: DG HIP (WITH OR WITHOUT PELVIS) 2-3V RIGHT COMPARISON:  None. FINDINGS: Vascular calcifications. Degenerative changes in the lower lumbar spine. Degenerative changes in the hips. No fractures are identified. IMPRESSION: No acute abnormalities. Electronically Signed   By: Dorise Bullion III M.D   On: 02/20/2016 13:23     Ct Head Code Stroke W/o Cm  Result Date: 03/05/2016 CLINICAL DATA:  Code stroke.  Slurred speech EXAM: CT HEAD WITHOUT CONTRAST TECHNIQUE: Contiguous axial images were obtained from the base of the skull through the vertex without intravenous contrast. COMPARISON:  CT 08/28/2013 FINDINGS: Brain: Ill-defined hypodensity involving the left insular cortex and the left frontal operculum compatible with subacute infarct. This was not present previously. No  hemorrhage. Ill-defined hypodensities in the right frontal lobe over the convexity and in the right parietal lobe also compatible with subacute infarct. Findings suggest cerebral emboli Generalized atrophy with chronic microvascular ischemic change in the white matter. Negative for hemorrhage or mass. Vascular: No hyperdense vessel or unexpected calcification. Skull: Normal. Negative for fracture or focal lesion. Sinuses/Orbits: No acute finding. Other: None ASPECTS ([place] Stroke Program Early CT Score) - Ganglionic level infarction (caudate, lentiform nuclei, internal capsule, insula, M1-M3 cortex): 5 - Supraganglionic infarction (M4-M6 cortex): 1 Total score (0-10 with 10 being normal): 6 IMPRESSION: 1. Bilateral ill-defined low-density areas most compatible with subacute infarctions. The largest is in the left frontal operculum and left insula. No associated hemorrhage. No mass-effect or midline shift Atrophy and chronic microvascular ischemia 2. ASPECTS is 6 for the left-sided infarct only. The right-sided hypodensities are not scored using the ASPECTS system which is for unilateral infarction. Electronically Signed   By: Franchot Gallo M.D.   On: 03/05/2016 15:31    Lab Data:  CBC:  Recent Labs Lab 03/05/16 1508 03/05/16 1523 03/06/16 0625  WBC 8.6  --  7.7  NEUTROABS 5.1  --   --   HGB 13.3 14.3 12.1  HCT 42.1 42.0 37.7  MCV 100.2*  --  99.2  PLT 164  --  0000000   Basic Metabolic Panel:  Recent Labs Lab 03/05/16 1508 03/05/16 1523  03/06/16 0625  NA 140 140 140  K 4.3 4.4 3.8  CL 105 102 105  CO2 24  --  27  GLUCOSE 111* 109* 109*  BUN 16 19 13   CREATININE 1.59* 1.50* 0.98  CALCIUM 9.3  --  9.0   GFR: Estimated Creatinine Clearance: 35.4 mL/min (by C-G formula based on SCr of 0.98 mg/dL). Liver Function Tests:  Recent Labs Lab 03/05/16 1508  AST 19  ALT 15  ALKPHOS 83  BILITOT 0.6  PROT 6.4*  ALBUMIN 3.6   No results for input(s): LIPASE, AMYLASE in the last 168 hours.  Recent Labs Lab 03/05/16 1858  AMMONIA 56*   Coagulation Profile:  Recent Labs Lab 03/05/16 1508  INR 1.26   Cardiac Enzymes: No results for input(s): CKTOTAL, CKMB, CKMBINDEX, TROPONINI in the last 168 hours. BNP (last 3 results)  Recent Labs  07/26/15 1558  PROBNP 512.0*   HbA1C: No results for input(s): HGBA1C in the last 72 hours. CBG:  Recent Labs Lab 03/05/16 1506 03/06/16 0639 03/06/16 1222  GLUCAP 106* 116* 114*   Lipid Profile: No results for input(s): CHOL, HDL, LDLCALC, TRIG, CHOLHDL, LDLDIRECT in the last 72 hours. Thyroid Function Tests:  Recent Labs  03/05/16 1858  TSH 1.224  FREET4 0.95   Anemia Panel: No results for input(s): VITAMINB12, FOLATE, FERRITIN, TIBC, IRON, RETICCTPCT in the last 72 hours. Urine analysis:    Component Value Date/Time   COLORURINE YELLOW 03/05/2016 1540   APPEARANCEUR HAZY (A) 03/05/2016 1540   LABSPEC 1.019 03/05/2016 1540   PHURINE 5.5 03/05/2016 1540   GLUCOSEU NEGATIVE 03/05/2016 1540   GLUCOSEU NEGATIVE 02/16/2013 1058   HGBUR NEGATIVE 03/05/2016 1540   HGBUR negative 12/18/2009 0937   BILIRUBINUR NEGATIVE 03/05/2016 1540   BILIRUBINUR Neg 11/20/2011 1458   KETONESUR NEGATIVE 03/05/2016 1540   PROTEINUR 30 (A) 03/05/2016 1540   UROBILINOGEN 0.2 12/20/2014 2204   NITRITE NEGATIVE 03/05/2016 1540   LEUKOCYTESUR SMALL (A) 03/05/2016 1540     RAI,RIPUDEEP M.D. [facility] 03/06/2016, 12:54 PM  Pager: [phone] Between 7am to 7pm - call  Pager - [phone]  After 7pm go to [email or web address] - password [email or web address]  Call night coverage person covering after 7pm

## 2016-03-06 NOTE — Evaluation (Signed)
Physical Therapy Evaluation Patient Details Name: Rebecca Skinner MRN: [id number] DOB: 1922/09/23 Today's Date: 03/06/2016   History of Present Illness  Patient is a 82 y/o female with hx of HTN, CAD, PAF, OA, dizziness, anxiety, CVA, memory loss, TKA, AVR, CABG presents with inability to speak. Found to have acute encephalopathy. Admitted for seizure workup with low suspicion for stroke  Clinical Impression  Patient presents with generalized weakness and mild balance deficits s/p above. During today's eval, pt ambulating Min guard-supervision for safety. Pt requires assist for ADLs at baseline. Per daughter, pt is functioning close to baseline and will have support at home. Encouraged OOB while in hospital. Pt does not require further skilled therapy services as pt functioning close to baseline. Discharge from therapy.     Follow Up Recommendations No PT follow up;Supervision for mobility/OOB    Equipment Recommendations  None recommended by PT    Recommendations for Other Services       Precautions / Restrictions Precautions Precautions: Fall Restrictions Weight Bearing Restrictions: No      Mobility  Bed Mobility Overal bed mobility: Needs Assistance Bed Mobility: Supine to Sit     Supine to sit: HOB elevated;Min guard     General bed mobility comments: Increased time to get to EOB. Use of rails, no assist needed.  Transfers Overall transfer level: Needs assistance Equipment used: Rolling walker (2 wheeled) Transfers: Sit to/from Stand Sit to Stand: Supervision         General transfer comment: Supervision for safety. Stood from [employer], from toilet x1. Transferred to chair post ambulation bout.  Ambulation/Gait Ambulation/Gait assistance: Min guard Ambulation Distance (Feet): 150 Feet Assistive device: Rolling walker (2 wheeled) Gait Pattern/deviations: Step-through pattern;Decreased stride length;Trunk flexed Gait velocity: decreased   General Gait Details: Slow,  mostly steady gait with use of RW for support. Daughter reports pt is walking close to baseline.  Stairs            Wheelchair Mobility    Modified Rankin (Stroke Patients Only)       Balance Overall balance assessment: Needs assistance Sitting-balance support: Feet supported;No upper extremity supported Sitting balance-Leahy Scale: Fair Sitting balance - Comments: able to perform pericare without difficulty.    Standing balance support: During functional activity Standing balance-Leahy Scale: Poor Standing balance comment: Reliant on BUEs for support.                              Pertinent Vitals/Pain Pain Assessment: No/denies pain    Home Living Family/patient expects to be discharged to:: Private residence Living Arrangements: Children Available Help at Discharge: Family;Available 24 hours/day Type of Home: House Home Access: Level entry     Home Layout: One level Home Equipment: Walker - 4 wheels;Wheelchair - manual;Shower seat      Prior Function Level of Independence: Needs assistance   Gait / Transfers Assistance Needed: Uses rollator for household ambulation. Pt reports fall a few weeks ago.  ADL's / Homemaking Assistance Needed: Assist from daughter for ADLs.         Hand Dominance   Dominant Hand: Right    Extremity/Trunk Assessment   Upper Extremity Assessment: Defer to OT evaluation           Lower Extremity Assessment: Generalized weakness         Communication   Communication: No difficulties  Cognition Arousal/Alertness: Awake/alert Behavior During Therapy: WFL for tasks assessed/performed Overall Cognitive Status: History  of cognitive impairments - at baseline                      General Comments General comments (skin integrity, edema, etc.): daughter present during session.    Exercises     Assessment/Plan    PT Assessment Patent does not need any further PT services  PT Problem List             PT Treatment Interventions      PT Goals (Current goals can be found in the Care Plan section)  Acute Rehab PT Goals Patient Stated Goal: to go home today PT Goal Formulation: With patient Time For Goal Achievement: 03/20/16 Potential to Achieve Goals: Good    Frequency     Barriers to discharge        Co-evaluation               End of Session Equipment Utilized During Treatment: Gait belt Activity Tolerance: Patient tolerated treatment well Patient left: in chair;with call bell/phone within reach;with family/visitor present (chair alarm pad under patient) Nurse Communication: Mobility status    Functional Assessment Tool Used: clinical judgment Functional Limitation: Mobility: Walking and moving around Mobility: Walking and Moving Around Current Status [phone]): At least 1 percent but less than 20 percent impaired, limited or restricted Mobility: Walking and Moving Around Goal Status [phone]): At least 1 percent but less than 20 percent impaired, limited or restricted Mobility: Walking and Moving Around Discharge Status [phone]): At least 1 percent but less than 20 percent impaired, limited or restricted    Time: 0926-0947 PT Time Calculation (min) (ACUTE ONLY): 21 min   Charges:   PT Evaluation $PT Eval Moderate Complexity: 1 Procedure     PT G Codes:   PT G-Codes **NOT FOR INPATIENT CLASS** Functional Assessment Tool Used: clinical judgment Functional Limitation: Mobility: Walking and moving around Mobility: Walking and Moving Around Current Status [id number]): At least 1 percent but less than 20 percent impaired, limited or restricted Mobility: Walking and Moving Around Goal Status [phone]): At least 1 percent but less than 20 percent impaired, limited or restricted Mobility: Walking and Moving Around Discharge Status [phone]): At least 1 percent but less than 20 percent impaired, limited or restricted    Rebecca Skinner 03/06/2016, 9:55 AM Wray Kearns, PT, DPT [phone]

## 2016-03-06 NOTE — Evaluation (Signed)
Clinical/Bedside Swallow Evaluation Patient Details  Name: Rebecca Skinner MRN: [id number] Date of Birth: 09/21/1922  Today's Date: 03/06/2016 Time: SLP Start Time (ACUTE ONLY): 0903 SLP Stop Time (ACUTE ONLY): 0921 SLP Time Calculation (min) (ACUTE ONLY): 18 min  Past Medical History:  Past Medical History:  Diagnosis Date  . Anxiety 11/20/2011  . Asthma    [facility]DER [facility]ARE [facility] DR PAZ  . CAD (coronary artery disease)    s/p CABG s/p AO valve replacement ([place] [facility])  . CVA (cerebral infarction) 08-2013   multiple, L, d/t Afib, started eloquis  . Diabetes mellitus    type 2   . Dizziness    Chronic, admiet 07-2010,saw neuro, thought to be a peripheral issue   . Dry skin   . GERD (gastroesophageal reflux disease)   . Hyperlipidemia   . Hypertension   . Hyperthyroidism   . Keloid    @ chest  . Memory loss   . Osteoarthritis   . Osteopenia    per dexa 12/09  . Paroxysmal atrial fibrillation ([place])    cards d/c coumadin 12/2008 d/t persisten NSR and frequent falls- restarted coumadin july 2011, now on Eliquis  . Recurrent UTI   . Shortness of breath dyspnea   . TIA (transient ischemic attack)    Past Surgical History:  Past Surgical History:  Procedure Laterality Date  . ABDOMINAL HYSTERECTOMY    . AORTIC VALVE REPLACEMENT  1998  . cagb  1998  . CAROTID DOPPLER  07/13/12   BILATERAL BULB/PROXIMAL ICAS;MILD AMOUNT OF FIBROUS PLAQUE WITH NO DIAMETER REDUCTION.  . CESAREAN SECTION     x 2  . COLONOSCOPY WITH PROPOFOL N/A 03/30/2014   Procedure: COLONOSCOPY WITH PROPOFOL;  Surgeon: Irene Shipper, MD;  Location: [facility];  Service: Endoscopy;  Laterality: N/A;  . CORONARY ARTERY BYPASS GRAFT  1998   SVG TO RCA  . HEMORRHOID SURGERY    . MYOCARDIAL PERFUSION STUDY  12/19/09   NORMAL PATTERN OF PERFUSION IN ALL REGIONS.EF 75%.  . OOPHORECTOMY    . TOTAL KNEE ARTHROPLASTY  1999  . TRANSTHORACIC ECHOCARDIOGRAM  09/11/11   LVEF >55%.STAGE 1 DIASTOLIC DYSFUNCTION,ELEVATED LV  FILLING PRESSURE.BIOPROSTHETIC AORTIC VALVE-PEAK AND MEAN GRADIENTS OF 17 MMHG AND 8 MMHG.SIGMOID SEPTUM.MILD TO MOD MR.MILD TO MOD TR.RVSP 46 MMHG.   HPI:  Rebecca Skinner a 82 y.o.femalewith a Past Medical History of anxiety, CAD, CVA, DM, GERD, HLD, HTN, thyroid dysfunction, keloids, dementia, PAF, recurrent UTIs who presents with acute encephalopathy. Evaluated by neuro recommends seizure workup with low suspicion for stroke. Seen by SLP in 2015 s/p CVA at bedside which indicated normal oropharyngeal swallowing function. Barium swallow in 2007 noted small hiatal hernia with considerable reflux.    Assessment / Plan / Recommendation Clinical Impression  Patient presents with a functional oropharyngeal swallow with no overt indication of aspiration. No SLP f/u indicated at this time.     Aspiration Risk  Mild aspiration risk    Diet Recommendation Regular;Thin liquid   Liquid Administration via: Cup;Straw Medication Administration: Whole meds with liquid Supervision: Patient able to self feed Compensations: Small sips/bites Postural Changes: Seated upright at 90 degrees    Other  Recommendations Oral Care Recommendations: Oral care BID   Follow up Recommendations                   Swallow Study   General HPI: Rebecca Skinner a 82 y.o.femalewith a Past Medical History of anxiety, CAD, CVA, DM, GERD,  HLD, HTN, thyroid dysfunction, keloids, dementia, PAF, recurrent UTIs who presents with acute encephalopathy. Evaluated by neuro recommends seizure workup with low suspicion for stroke. Seen by SLP in 2015 s/p CVA at bedside which indicated normal oropharyngeal swallowing function. Barium swallow in 2007 noted small hiatal hernia with considerable reflux.  Type of Study: Bedside Swallow Evaluation Previous Swallow Assessment: see HPI Diet Prior to this Study: NPO Temperature Spikes Noted: No Respiratory Status: Room air History of Recent Intubation: No Behavior/Cognition:  Alert;Cooperative;Pleasant mood Oral Cavity Assessment: Within Functional Limits Oral Care Completed by SLP: No Oral Cavity - Dentition: Dentures, top;Dentures, bottom Vision: Functional for self-feeding Self-Feeding Abilities: Able to feed self Patient Positioning: Upright in bed Baseline Vocal Quality: Normal Volitional Cough: Strong Volitional Swallow: Able to elicit    Oral/Motor/Sensory Function Overall Oral Motor/Sensory Function: Within functional limits   Ice Chips Ice chips: Not tested   Thin Liquid Thin Liquid: Within functional limits Presentation: Cup;Self Fed;Straw    Nectar Thick Nectar Thick Liquid: Not tested   Honey Thick Honey Thick Liquid: Not tested   Puree Puree: Within functional limits Presentation: Spoon;Self Fed   Solid   GO   Solid: Within functional limits Presentation: Self Fed    Functional Assessment Tool Used: skilled clinical judgement Functional Limitations: Swallowing Swallow Current Status [id number]): 0 percent impaired, limited or restricted Swallow Goal Status [id number]): 0 percent impaired, limited or restricted Swallow Discharge Status [phone]): 0 percent impaired, limited or restricted  Gabriel Rainwater MA, CCC-SLP [phone]  Mieka Leaton Meryl 03/06/2016,10:17 AM

## 2016-03-06 NOTE — Progress Notes (Signed)
Patient taken to EEG 

## 2016-03-07 ENCOUNTER — Telehealth: Payer: Self-pay | Admitting: *Deleted

## 2016-03-07 LAB — HEMOGLOBIN A1C
Hgb A1c MFr Bld: 6.7 % — ABNORMAL HIGH (ref 4.8–5.6)
Mean Plasma Glucose: 146 mg/dL

## 2016-03-07 NOTE — Telephone Encounter (Signed)
Unable to reach patient at time of TCM Call. Left message for patient to return call when available.  

## 2016-03-08 NOTE — Telephone Encounter (Signed)
Unable to reach patient at time of TCM Call.  Left message for patient to return call when available.    Pt has HFU appt scheduled w/ PCP within appropriate time-frame.

## 2016-03-15 ENCOUNTER — Other Ambulatory Visit: Payer: Self-pay

## 2016-03-15 MED ORDER — METHIMAZOLE 5 MG PO TABS: 5.0000 mg | ORAL_TABLET | ORAL | 2 refills | Status: DC

## 2016-03-17 DIAGNOSIS — J4550 Severe persistent asthma, uncomplicated: Secondary | ICD-10-CM | POA: Diagnosis not present

## 2016-03-20 ENCOUNTER — Ambulatory Visit (INDEPENDENT_AMBULATORY_CARE_PROVIDER_SITE_OTHER): Payer: Medicare Other | Admitting: Internal Medicine

## 2016-03-20 VITALS — BP 118/58 | HR 50 | Temp 98.4°F | Resp 18 | Ht 61.0 in | Wt 172.8 lb

## 2016-03-20 DIAGNOSIS — F3289 Other specified depressive episodes: Secondary | ICD-10-CM

## 2016-03-20 DIAGNOSIS — H40033 Anatomical narrow angle, bilateral: Secondary | ICD-10-CM | POA: Diagnosis not present

## 2016-03-20 DIAGNOSIS — Z09 Encounter for follow-up examination after completed treatment for conditions other than malignant neoplasm: Secondary | ICD-10-CM

## 2016-03-20 DIAGNOSIS — R404 Transient alteration of awareness: Secondary | ICD-10-CM

## 2016-03-20 DIAGNOSIS — H2513 Age-related nuclear cataract, bilateral: Secondary | ICD-10-CM | POA: Diagnosis not present

## 2016-03-20 DIAGNOSIS — S301XXS Contusion of abdominal wall, sequela: Secondary | ICD-10-CM

## 2016-03-20 DIAGNOSIS — Z8661 Personal history of infections of the central nervous system: Secondary | ICD-10-CM

## 2016-03-20 DIAGNOSIS — E0590 Thyrotoxicosis, unspecified without thyrotoxic crisis or storm: Secondary | ICD-10-CM | POA: Diagnosis not present

## 2016-03-20 DIAGNOSIS — R58 Hemorrhage, not elsewhere classified: Secondary | ICD-10-CM

## 2016-03-20 NOTE — Progress Notes (Signed)
Subjective:    Patient ID: Rebecca Skinner, female    DOB: 1923/02/11, 82 y.o.   MRN: [id number]  DOS:  03/20/2016 Type of visit - description : TCM, hospital follow-up Interval history: Admitted to hospital 03/05/2016, discharged the next day Was admitted with acute encephalopathy, question of stroke upon admission, saw neurology, DDX including a seizure. CT show no acute changes, EEG was negative, no seizure activity during admission. Was evaluated by PT, no need for outpatient PT. Eventually went back to baseline and was discharged home on no new medications. Etiology of sx not completely clear, related to increase creatinine?. Creatinine was increased, went down to 0.9 with gentle hydration.  Review of Systems Since she left the hospital she is feeling gradually better, patient states she is not still back to her baseline, slightly more tired and short of breath than usual. Daughter is here today, she feels the pt is back to normal. Appetite is a slightly decreased, not a new issue. No recent chest pain or lower extremity edema I asked about depression, she states that she is sometimes sad because she had to move out of her house a couple of months ago. No suicidal ideas. She's also concerned about ecchymoses on the lower abdomen where she got injections at the hospital, likely Lovenox  Past Medical History:  Diagnosis Date  . Anxiety 11/20/2011  . Asthma    UN[facility] DR Samauri Kellenberger  . CAD (coronary artery disease)    s/p CABG s/p AO valve replacement ([place] [facility])  . CVA (cerebral infarction) 08-2013   multiple, L, d/t Afib, started eloquis  . Diabetes mellitus    type 2   . Dizziness    Chronic, admiet 07-2010,saw neuro, thought to be a peripheral issue   . Dry skin   . GERD (gastroesophageal reflux disease)   . Hyperlipidemia   . Hypertension   . Hyperthyroidism   . Keloid    @ chest  . Memory loss   . Osteoarthritis   . Osteopenia    per dexa 12/09  . Paroxysmal  atrial fibrillation ([place])    cards d/c coumadin 12/2008 d/t persisten NSR and frequent falls- restarted coumadin july 2011, now on Eliquis  . Recurrent UTI   . Shortness of breath dyspnea   . TIA (transient ischemic attack)     Past Surgical History:  Procedure Laterality Date  . ABDOMINAL HYSTERECTOMY    . AORTIC VALVE REPLACEMENT  1998  . cagb  1998  . CAROTID DOPPLER  07/13/12   BILATERAL BULB/PROXIMAL ICAS;MILD AMOUNT OF FIBROUS PLAQUE WITH NO DIAMETER REDUCTION.  . CESAREAN SECTION     x 2  . COLONOSCOPY WITH PROPOFOL N/A 03/30/2014   Procedure: COLONOSCOPY WITH PROPOFOL;  Surgeon: Irene Shipper, MD;  Location: [facility];  Service: Endoscopy;  Laterality: N/A;  . CORONARY ARTERY BYPASS GRAFT  1998   SVG TO RCA  . HEMORRHOID SURGERY    . MYOCARDIAL PERFUSION STUDY  12/19/09   NORMAL PATTERN OF PERFUSION IN ALL REGIONS.EF 75%.  . OOPHORECTOMY    . TOTAL KNEE ARTHROPLASTY  1999  . TRANSTHORACIC ECHOCARDIOGRAM  09/11/11   LVEF >55%.STAGE 1 DIASTOLIC DYSFUNCTION,ELEVATED LV FILLING PRESSURE.BIOPROSTHETIC AORTIC VALVE-PEAK AND MEAN GRADIENTS OF 17 MMHG AND 8 MMHG.SIGMOID SEPTUM.MILD TO MOD MR.MILD TO MOD TR.RVSP 46 MMHG.    Social History   Social History  . Marital status: Divorced    Spouse name: N/A  . Number of children: 4  .  Years of education: N/A   Occupational History  . retired  Retired   Social History Main Topics  . Smoking status: Former Research scientist (life sciences)  . Smokeless tobacco: Never Used     Comment: quit 2004, smoked 1.5 ppd  . Alcohol use No  . Drug use: No  . Sexual activity: Not on file   Other Topics Concern  . Not on file   Social History Narrative   Had to moved w/ her daughter ~ 32-2017   Had 3 daughter- 2 son  (lost oldest daughter and son), 2 living daughters in [place]        Medication List       Accurate as of 03/20/16 11:59 PM. Always use your most recent med list.          acetaminophen 500 MG tablet Commonly known as:  TYLENOL Take  1,000 mg by mouth every 6 (six) hours as needed for moderate pain (pain).   albuterol 108 (90 Base) MCG/ACT inhaler Commonly known as:  PROAIR HFA Inhale 2 puffs into the lungs every 6 (six) hours as needed for wheezing or shortness of breath.   ALPRAZolam 0.5 MG tablet Commonly known as:  XANAX Take 1 tablet (0.5 mg total) by mouth at bedtime as needed for anxiety or sleep.   artificial tears Oint ophthalmic ointment Place into both eyes at bedtime as needed for dry eyes.   aspirin 81 MG tablet Take 81 mg by mouth daily.   atorvastatin 40 MG tablet Commonly known as:  LIPITOR Take 1 tablet (40 mg total) by mouth at bedtime.   benazepril 20 MG tablet Commonly known as:  LOTENSIN TAKE 1 TABLET BY MOUTH EVERY DAY (NEED OFFICE VISIT FOR MORE REFILLS!!)   budesonide 0.5 MG/2ML nebulizer solution Commonly known as:  PULMICORT Take 2 mLs (0.5 mg total) by nebulization 2 (two) times daily.   CALCIUM 500 PO Take 500 mg by mouth at bedtime.   cholecalciferol 1000 units tablet Commonly known as:  VITAMIN D Take 1,000 Units by mouth at bedtime.   diltiazem 120 MG 12 hr capsule Commonly known as:  CARDIZEM SR Take 1 capsule (120 mg total) by mouth 2 (two) times daily.   dorzolamide-timolol 22.3-6.8 MG/ML ophthalmic solution Commonly known as:  COSOPT Place 1 drop into both eyes 2 (two) times daily.   esomeprazole 40 MG capsule Commonly known as:  NEXIUM Take 1 capsule (40 mg total) by mouth daily before breakfast.   furosemide 20 MG tablet Commonly known as:  LASIX Take 2 tablets (40 mg total) by mouth daily.   glucose blood test strip 1 each by Other route as needed for other (blood sugar test). Reported on 11/10/2015   ketoconazole 2 % cream Commonly known as:  NIZORAL Apply 1 application topically daily.   Ketoprofen 10 % Crea Apply twice a day to the knee as needed   levalbuterol 0.63 MG/3ML nebulizer solution Commonly known as:  XOPENEX Take 3 mLs (0.63 mg total)  by nebulization 2 (two) times daily.   magnesium oxide 400 (241.3 Mg) MG tablet Commonly known as:  MAG-OX Take 1 tablet (400 mg total) by mouth daily.   methimazole 5 MG tablet Commonly known as:  TAPAZOLE Take 1 tablet (5 mg total) by mouth 3 (three) times a week.   metoprolol tartrate 25 MG tablet Commonly known as:  LOPRESSOR Take 1 tablet (25 mg total) by mouth 3 (three) times daily.   nitroGLYCERIN 0.4 MG SL tablet Commonly known as:  NITROSTAT Place 0.4 mg under the tongue every 5 (five) minutes as needed for chest pain (chest pain). Reported on 11/10/2015   potassium chloride 10 MEQ tablet Commonly known as:  KLOR-CON M10 Take 1 tablet (10 mEq total) by mouth daily.   sitaGLIPtin 50 MG tablet Commonly known as:  JANUVIA Take 1 tablet (50 mg total) by mouth daily.   TRAVATAN Z 0.004 % Soln ophthalmic solution Generic drug:  Travoprost (BAK Free) Place 1 drop into both eyes at bedtime.          Objective:   Physical Exam  Abdominal:     BP (!) 118/58 (BP Location: Left Arm, Patient Position: Sitting, Cuff Size: Normal)   Pulse (!) 50   Temp 98.4 F (36.9 C) (Oral)   Resp 18   Ht 5\' 1"  (1.549 m)   Wt 172 lb 12.8 oz (78.4 kg)   SpO2 95%   BMI 32.65 kg/m  General:   Well developed, well nourished . NAD.  HEENT:  Normocephalic . Face symmetric, atraumatic Lungs:  Decreased breath sounds , few end expiratory wheezes, mild. Normal respiratory effort, no intercostal retractions, no accessory muscle use. Heart: Irregular, bradycardic ,  no murmur.  No pretibial edema bilaterally   Neurologic:  alert & oriented X3.  Speech normal, gait assisted by a walker, hesitant, difficulty due to DJD.  Psych--  Cognition and judgment appear intact.  Cooperative with normal attention span and concentration.  Behavior appropriate. No anxious or depressed appearing.      Assessment & Plan:   Assessment DM HTN Hyperlipidemia Hyperthyroidism -- used to see Dr.  Loanne Drilling, last visit 01-2016, now f/u PCP, check labs x 2 q year GERD Asthma  Anxiety  On xanax  CV: Dr. Claiborne Billings --CHF, CAD, CABG, Aortic valve replacement --Paroxysmal atrial fibrillation, used to be on Coumadin,   Eliquis rx after the stroke 3-15, d/c 03-2014 d/t GI bleed , on ASA 81 --TIAs, CVA 2015 DJD Osteopenia 2009 Memory deficits  Recurrent UTIs Daughter  Jasmine Awe;   PLAN Mental status changes: Workup at the hospital neg, not completely back to normal according to the patient but she seems to be recovering. Recommend no change at this time Ecchymoses, lower abdomen, likely from Lovenox, no evidence of infection or abscess, we recommend observation, Tylenol, ice pack. If not gradually better they will let me know Mild depression: Counseled. Hyperthyroidism: Last visit with Dr. Loanne Drilling 01-2016, he likes to defer treatment to me. He recommended labs twice a year. RTC 2 months.

## 2016-03-20 NOTE — Patient Instructions (Signed)
See you in December  Call if not feeling gradually better  Ice pack and tylenol to the stomach

## 2016-03-20 NOTE — Progress Notes (Signed)
Pre visit review using our clinic review tool, if applicable. No additional management support is needed unless otherwise documented below in the visit note. 

## 2016-03-21 ENCOUNTER — Encounter: Payer: Self-pay | Admitting: Internal Medicine

## 2016-03-21 NOTE — Assessment & Plan Note (Signed)
Mental status changes: Workup at the hospital neg, not completely back to normal according to the patient but she seems to be recovering. Recommend no change at this time Ecchymoses, lower abdomen, likely from Lovenox, no evidence of infection or abscess, we recommend observation, Tylenol, ice pack. If not gradually better they will let me know Mild depression: Counseled. Hyperthyroidism: Last visit with Dr. Loanne Drilling 01-2016, he likes to defer treatment to me. He recommended labs twice a year. RTC 2 months.

## 2016-04-11 ENCOUNTER — Other Ambulatory Visit: Payer: Self-pay | Admitting: Endocrinology

## 2016-04-11 ENCOUNTER — Telehealth: Payer: Self-pay | Admitting: Internal Medicine

## 2016-04-11 NOTE — Telephone Encounter (Signed)
Please advise 

## 2016-04-11 NOTE — Telephone Encounter (Signed)
Rebecca Skinner - pt's daughter   She says that her mom will no longer be seeing the provider that prescribed her medication methimazole. She would like to know if Dr. Larose Kells would continue with her refills? She says that her mom only take this medication on Monday, Wednesday and Friday.     Pharmacy: [id number] - [place], [place] [address]

## 2016-04-11 NOTE — Telephone Encounter (Signed)
Okay to refill a two-month supply

## 2016-04-11 NOTE — Telephone Encounter (Signed)
Please refer request to PCP, as pt has requested to ret here just prn

## 2016-04-12 MED ORDER — METHIMAZOLE 5 MG PO TABS: 5.0000 mg | ORAL_TABLET | ORAL | 1 refills | Status: DC

## 2016-04-12 NOTE — Telephone Encounter (Signed)
Rx sent 

## 2016-04-17 DIAGNOSIS — J4550 Severe persistent asthma, uncomplicated: Secondary | ICD-10-CM | POA: Diagnosis not present

## 2016-04-29 ENCOUNTER — Other Ambulatory Visit: Payer: Self-pay | Admitting: Cardiovascular Disease

## 2016-05-03 ENCOUNTER — Other Ambulatory Visit: Payer: Self-pay | Admitting: Internal Medicine

## 2016-05-13 ENCOUNTER — Telehealth: Payer: Self-pay | Admitting: Internal Medicine

## 2016-05-13 NOTE — Telephone Encounter (Signed)
Bridgette a NP with [employer] called regarding patient and would like a call back. Please advise   Phone: [phone]

## 2016-05-13 NOTE — Telephone Encounter (Signed)
Spoke w/ Bridgette, informed she saw Pt for [employer]. She would like to recommend 2 things:  1) Pt's HR is 48 and would recommend d/c Metoprolol altogether 2) Per Pt's daughter, Pt has loss of appetite and doesn't eat frequently. She would recommend for a 82 year old d/c DM management and meds. She questioned what last A1c was which was 6.9 four months ago. She informed that new goals for A1c patients above 49 years old is 8.0.   She informed that these are her recommendations and that Pt and her daughter are waiting for further advice.

## 2016-05-14 NOTE — Telephone Encounter (Signed)
Is on metoprolol and Cardizem due to history of paroxysmal A. fib. Recommend the following: Decrease metoprolol from TID to BID Stop Januvia

## 2016-05-14 NOTE — Telephone Encounter (Signed)
Spoke w/ Stanton Kidney, Pt's daughter, informed her of recommendations to decrease Metoprolol to BID and d/c Januvia altogether. Cande verbalized understanding. Med list updated.

## 2016-05-14 NOTE — Addendum Note (Signed)
Addended byDamita Dunnings D on: 05/14/2016 01:45 PM   Modules accepted: Orders

## 2016-05-17 DIAGNOSIS — J4550 Severe persistent asthma, uncomplicated: Secondary | ICD-10-CM | POA: Diagnosis not present

## 2016-05-19 ENCOUNTER — Other Ambulatory Visit: Payer: Self-pay | Admitting: Cardiovascular Disease

## 2016-05-20 ENCOUNTER — Ambulatory Visit (INDEPENDENT_AMBULATORY_CARE_PROVIDER_SITE_OTHER): Payer: Medicare Other

## 2016-05-20 ENCOUNTER — Ambulatory Visit (INDEPENDENT_AMBULATORY_CARE_PROVIDER_SITE_OTHER): Payer: Medicare Other | Admitting: Family Medicine

## 2016-05-20 VITALS — Ht 61.0 in | Wt 170.0 lb

## 2016-05-20 DIAGNOSIS — R1031 Right lower quadrant pain: Secondary | ICD-10-CM

## 2016-05-20 LAB — POCT CBC
GRANULOCYTE PERCENT: 80.4 % — AB (ref 37–80)
HCT, POC: 46.9 % (ref 37.7–47.9)
Hemoglobin: 16.3 g/dL — AB (ref 12.2–16.2)
Lymph, poc: 1.7 (ref 0.6–3.4)
MCH, POC: 33.8 pg — AB (ref 27–31.2)
MCHC: 34.8 g/dL (ref 31.8–35.4)
MCV: 97.1 fL — AB (ref 80–97)
MID (CBC): 0.4 (ref 0–0.9)
MPV: 8.6 fL (ref 0–99.8)
PLATELET COUNT, POC: 183 10*3/uL (ref 142–424)
POC Granulocyte: 8.8 — AB (ref 2–6.9)
POC LYMPH %: 15.7 % (ref 10–50)
POC MID %: 3.9 % (ref 0–12)
RBC: 4.83 M/uL (ref 4.04–5.48)
RDW, POC: 15.3 %
WBC: 11 10*3/uL — AB (ref 4.6–10.2)

## 2016-05-20 NOTE — Progress Notes (Signed)
Chief Complaint  Patient presents with  . Abdominal Pain    Onset this am  . Emesis    this am    HPI  Pt reports that she started having stomach pains "around the navel" that was around 10pm This morning she told her daughter about her abdominal pain. This morning she ate 2 pieces of toast with butter, cup of coffee and juice. Her daughter reports that she has been using the bathroom.  She had a bowel movement today that was nonbloody.  She was seen by the house call nurse 3 days ago and was told that her urine was clear without any infection.    Past Medical History:  Diagnosis Date  . Anxiety 11/20/2011  . Asthma    [facility] PA[facility]  . CAD (coronary artery disease)    s/p CABG s/p AO valve replacement ([place] [facility])  . CVA (cerebral infarction) 08-2013   multiple, L, d/t Afib, started eloquis  . Diabetes mellitus    type 2   . Dizziness    Chronic, admiet 07-2010,saw neuro, thought to be a peripheral issue   . Dry skin   . GERD (gastroesophageal reflux disease)   . Hyperlipidemia   . Hypertension   . Hyperthyroidism   . Keloid    @ chest  . Memory loss   . Osteoarthritis   . Osteopenia    per dexa 12/09  . Paroxysmal atrial fibrillation ([place])    cards d/c coumadin 12/2008 d/t persisten NSR and frequent falls- restarted coumadin july 2011, now on Eliquis  . Recurrent UTI   . Shortness of breath dyspnea   . TIA (transient ischemic attack)     Current Outpatient Prescriptions  Medication Sig Dispense Refill  . acetaminophen (TYLENOL) 500 MG tablet Take 1,000 mg by mouth every 6 (six) hours as needed for moderate pain (pain).    Marland Kitchen albuterol (PROAIR HFA) 108 (90 Base) MCG/ACT inhaler Inhale 2 puffs into the lungs every 6 (six) hours as needed for wheezing or shortness of breath. 18 g 6  . ALPRAZolam (XANAX) 0.5 MG tablet Take 1 tablet (0.5 mg total) by mouth at bedtime as needed for anxiety or sleep. 30 tablet 3  . artificial tears (LACRILUBE) OINT  ophthalmic ointment Place into both eyes at bedtime as needed for dry eyes. 3.5 g 11  . aspirin 81 MG tablet Take 81 mg by mouth daily.    Marland Kitchen atorvastatin (LIPITOR) 40 MG tablet Take 1 tablet (40 mg total) by mouth at bedtime. 90 tablet 1  . benazepril (LOTENSIN) 20 MG tablet Take 1 tablet (20 mg total) by mouth daily. 90 tablet 0  . budesonide (PULMICORT) 0.5 MG/2ML nebulizer solution Take 2 mLs (0.5 mg total) by nebulization 2 (two) times daily. 120 mL 5  . Calcium Carbonate (CALCIUM 500 PO) Take 500 mg by mouth at bedtime.     . cholecalciferol (VITAMIN D) 1000 UNITS tablet Take 1,000 Units by mouth at bedtime.     Marland Kitchen diltiazem (CARDIZEM SR) 120 MG 12 hr capsule TAKE ONE CAPSULE BY MOUTH TWICE A DAY 30 capsule 3  . dorzolamide-timolol (COSOPT) 22.3-6.8 MG/ML ophthalmic solution Place 1 drop into both eyes 2 (two) times daily.     Marland Kitchen esomeprazole (NEXIUM) 40 MG capsule Take 1 capsule (40 mg total) by mouth daily before breakfast. 90 capsule 1  . furosemide (LASIX) 20 MG tablet Take 2 tablets (40 mg total) by mouth daily. 180 tablet 1  .  levalbuterol (XOPENEX) 0.63 MG/3ML nebulizer solution Take 3 mLs (0.63 mg total) by nebulization 2 (two) times daily. 180 mL 5  . magnesium oxide (MAG-OX) 400 (241.3 Mg) MG tablet Take 1 tablet (400 mg total) by mouth daily. 90 tablet 2  . methimazole (TAPAZOLE) 5 MG tablet Take 1 tablet (5 mg total) by mouth 3 (three) times a week. 40 tablet 1  . metoprolol tartrate (LOPRESSOR) 25 MG tablet Take 1 tablet (25 mg total) by mouth 2 (two) times daily.    . nitroGLYCERIN (NITROSTAT) 0.4 MG SL tablet Place 0.4 mg under the tongue every 5 (five) minutes as needed for chest pain (chest pain). Reported on 11/10/2015    . potassium chloride (KLOR-CON M10) 10 MEQ tablet Take 1 tablet (10 mEq total) by mouth daily. 90 tablet 2  . TRAVATAN Z 0.004 % SOLN ophthalmic solution Place 1 drop into both eyes at bedtime.     Marland Kitchen PAZEO 0.7 % SOLN     . RESTASIS MULTIDOSE 0.05 % ophthalmic  emulsion      No current facility-administered medications for this visit.     Allergies:  Allergies  Allergen Reactions  . Hydrocodone Itching and Nausea Only  . Tramadol Hcl Itching and Nausea Only    Past Surgical History:  Procedure Laterality Date  . ABDOMINAL HYSTERECTOMY    . AORTIC VALVE REPLACEMENT  1998  . cagb  1998  . CAROTID DOPPLER  07/13/12   BILATERAL BULB/PROXIMAL ICAS;MILD AMOUNT OF FIBROUS PLAQUE WITH NO DIAMETER REDUCTION.  . CESAREAN SECTION     x 2  . COLONOSCOPY WITH PROPOFOL N/A 03/30/2014   Procedure: COLONOSCOPY WITH PROPOFOL;  Surgeon: Irene Shipper, MD;  Location: [facility];  Service: Endoscopy;  Laterality: N/A;  . CORONARY ARTERY BYPASS GRAFT  1998   SVG TO RCA  . HEMORRHOID SURGERY    . MYOCARDIAL PERFUSION STUDY  12/19/09   NORMAL PATTERN OF PERFUSION IN ALL REGIONS.EF 75%.  . OOPHORECTOMY    . TOTAL KNEE ARTHROPLASTY  1999  . TRANSTHORACIC ECHOCARDIOGRAM  09/11/11   LVEF >55%.STAGE 1 DIASTOLIC DYSFUNCTION,ELEVATED LV FILLING PRESSURE.BIOPROSTHETIC AORTIC VALVE-PEAK AND MEAN GRADIENTS OF 17 MMHG AND 8 MMHG.SIGMOID SEPTUM.MILD TO MOD MR.MILD TO MOD TR.RVSP 46 MMHG.    Social History   Social History  . Marital status: Divorced    Spouse name: N/A  . Number of children: 4  . Years of education: N/A   Occupational History  . retired  Retired   Social History Main Topics  . Smoking status: Former Research scientist (life sciences)  . Smokeless tobacco: Never Used     Comment: quit 2004, smoked 1.5 ppd  . Alcohol use No  . Drug use: No  . Sexual activity: Not Asked   Other Topics Concern  . None   Social History Narrative   Had to moved w/ her daughter ~ 18-2017   Had 3 daughter- 2 son  (lost oldest daughter and son), 2 living daughters in [place]    ROS  Objective: Vitals:   05/20/16 1449  Weight: 170 lb (77.1 kg)  Height: 5\' 1"  (1.549 m)    Physical Exam  Constitutional: She is oriented to person, place, and time. She appears well-developed and  well-nourished.  HENT:  Head: Normocephalic and atraumatic.  Eyes: Conjunctivae and EOM are normal.  Neck: Normal range of motion. No thyromegaly present.  Cardiovascular: Normal rate and regular rhythm.   Pulmonary/Chest: Effort normal and breath sounds normal. No respiratory distress.  Abdominal: Soft. Normal  appearance and bowel sounds are normal. She exhibits no shifting dullness, no distension, no pulsatile liver, no fluid wave, no abdominal bruit, no ascites, no pulsatile midline mass and no mass. There is tenderness in the right upper quadrant and right lower quadrant.    Neurological: She is alert and oriented to person, place, and time.  Skin: Skin is warm. Capillary refill takes less than 2 seconds. No erythema.   Study Result   CLINICAL DATA:  Right lower quadrant abdominal pain.  EXAM: ABDOMEN - 1 VIEW  COMPARISON:  None.  FINDINGS: Normal bowel gas pattern. No evidence of bowel obstruction or significant generalized adynamic ileus.  No convincing renal or ureteral stones. There are vascular calcifications along the aorta and iliac arteries. Soft tissues are otherwise unremarkable.  There are significant degenerative changes of the lumbar spine.  IMPRESSION: No acute abnormalities.  No evidence of bowel obstruction.   Electronically Signed   By: Lajean Manes M.D.   On: 05/20/2016 16:32    Assessment and Plan Joel was seen today for abdominal pain and emesis.  Diagnoses and all orders for this visit:  Right lower quadrant abdominal pain-  Pt unable to void again after voiding while waiting to be roomed Discussed abd xray with patient and family member No obstruction or constipation -     DG Abd 1 View -     POCT Microscopic Urinalysis ([facility]) -     POCT urinalysis dipstick -     POCT CBC   Advised pt to follow up if symptoms worsen but at this time she should eat smaller meals that are easier to digest.  Rebecca Skinner A Nolon Rod

## 2016-05-20 NOTE — Patient Instructions (Signed)
     IF you received an x-ray today, you will receive an invoice from [facility]. Please contact [facility] at [phone] with questions or concerns regarding your invoice.   IF you received labwork today, you will receive an invoice from [facility]. Please contact [facility] at [phone] with questions or concerns regarding your invoice.   Our billing staff will not be able to assist you with questions regarding bills from these companies.  You will be contacted with the lab results as soon as they are available. The fastest way to get your results is to activate your My Chart account. Instructions are located on the last page of this paperwork. If you have not heard from us regarding the results in 2 weeks, please contact this office.      

## 2016-05-22 ENCOUNTER — Ambulatory Visit: Payer: Medicare Other | Admitting: Cardiovascular Disease

## 2016-05-22 ENCOUNTER — Encounter: Payer: Self-pay | Admitting: *Deleted

## 2016-05-29 ENCOUNTER — Encounter: Payer: Self-pay | Admitting: Internal Medicine

## 2016-05-29 ENCOUNTER — Telehealth: Payer: Self-pay | Admitting: Internal Medicine

## 2016-05-29 ENCOUNTER — Ambulatory Visit (INDEPENDENT_AMBULATORY_CARE_PROVIDER_SITE_OTHER): Payer: Medicare Other | Admitting: Internal Medicine

## 2016-05-29 ENCOUNTER — Ambulatory Visit (HOSPITAL_BASED_OUTPATIENT_CLINIC_OR_DEPARTMENT_OTHER)
Admission: RE | Admit: 2016-05-29 | Discharge: 2016-05-29 | Disposition: A | Discharge status: Home / Self Care | Payer: Medicare Other | Source: Ambulatory Visit | Attending: Internal Medicine | Admitting: Internal Medicine

## 2016-05-29 VITALS — BP 122/78 | HR 47 | Temp 98.1°F | Resp 14 | Ht 61.0 in | Wt 170.5 lb

## 2016-05-29 DIAGNOSIS — W19XXXA Unspecified fall, initial encounter: Secondary | ICD-10-CM

## 2016-05-29 DIAGNOSIS — E785 Hyperlipidemia, unspecified: Secondary | ICD-10-CM

## 2016-05-29 DIAGNOSIS — E0590 Thyrotoxicosis, unspecified without thyrotoxic crisis or storm: Secondary | ICD-10-CM

## 2016-05-29 DIAGNOSIS — Z951 Presence of aortocoronary bypass graft: Secondary | ICD-10-CM | POA: Diagnosis not present

## 2016-05-29 DIAGNOSIS — L989 Disorder of the skin and subcutaneous tissue, unspecified: Secondary | ICD-10-CM

## 2016-05-29 DIAGNOSIS — M25511 Pain in right shoulder: Secondary | ICD-10-CM

## 2016-05-29 DIAGNOSIS — I10 Essential (primary) hypertension: Secondary | ICD-10-CM

## 2016-05-29 DIAGNOSIS — E119 Type 2 diabetes mellitus without complications: Secondary | ICD-10-CM | POA: Diagnosis not present

## 2016-05-29 DIAGNOSIS — Z0000 Encounter for general adult medical examination without abnormal findings: Secondary | ICD-10-CM | POA: Diagnosis not present

## 2016-05-29 DIAGNOSIS — Z952 Presence of prosthetic heart valve: Secondary | ICD-10-CM | POA: Diagnosis not present

## 2016-05-29 DIAGNOSIS — K219 Gastro-esophageal reflux disease without esophagitis: Secondary | ICD-10-CM

## 2016-05-29 DIAGNOSIS — J4540 Moderate persistent asthma, uncomplicated: Secondary | ICD-10-CM

## 2016-05-29 DIAGNOSIS — M19011 Primary osteoarthritis, right shoulder: Secondary | ICD-10-CM | POA: Insufficient documentation

## 2016-05-29 LAB — CBC WITH DIFFERENTIAL/PLATELET
BASOS PCT: 0.9 % (ref 0.0–3.0)
Basophils Absolute: 0.1 10*3/uL (ref 0.0–0.1)
EOS PCT: 3.1 % (ref 0.0–5.0)
Eosinophils Absolute: 0.2 10*3/uL (ref 0.0–0.7)
HEMATOCRIT: 40 % (ref 36.0–46.0)
Hemoglobin: 13.1 g/dL (ref 12.0–15.0)
LYMPHS PCT: 23.6 % (ref 12.0–46.0)
Lymphs Abs: 1.6 10*3/uL (ref 0.7–4.0)
MCHC: 32.7 g/dL (ref 30.0–36.0)
MCV: 99.2 fl (ref 78.0–100.0)
MONOS PCT: 10.4 % (ref 3.0–12.0)
Monocytes Absolute: 0.7 10*3/uL (ref 0.1–1.0)
NEUTROS ABS: 4.3 10*3/uL (ref 1.4–7.7)
Neutrophils Relative %: 62 % (ref 43.0–77.0)
PLATELETS: 160 10*3/uL (ref 150.0–400.0)
RBC: 4.03 Mil/uL (ref 3.87–5.11)
RDW: 14.7 % (ref 11.5–15.5)
WBC: 6.9 10*3/uL (ref 4.0–10.5)

## 2016-05-29 LAB — LIPID PANEL
CHOLESTEROL: 117 mg/dL (ref 0–200)
HDL: 38.5 mg/dL — AB (ref >39.00)
LDL Cholesterol: 62 mg/dL (ref 0–99)
NonHDL: 78.27
TRIGLYCERIDES: 83 mg/dL (ref 0.0–149.0)
Total CHOL/HDL Ratio: 3
VLDL: 16.6 mg/dL (ref 0.0–40.0)

## 2016-05-29 LAB — BASIC METABOLIC PANEL
BUN: 19 mg/dL (ref 6–23)
CALCIUM: 9.6 mg/dL (ref 8.4–10.5)
CO2: 28 mEq/L (ref 19–32)
Chloride: 105 mEq/L (ref 96–112)
Creatinine, Ser: 1.12 mg/dL (ref 0.40–1.20)
GFR: 58.59 mL/min — ABNORMAL LOW (ref >60.00)
Glucose, Bld: 127 mg/dL — ABNORMAL HIGH (ref 70–99)
Potassium: 4.9 mEq/L (ref 3.5–5.1)
SODIUM: 143 meq/L (ref 135–145)

## 2016-05-29 NOTE — Progress Notes (Signed)
Pre visit review using our clinic review tool, if applicable. No additional management support is needed unless otherwise documented below in the visit note. 

## 2016-05-29 NOTE — Telephone Encounter (Signed)
Not necessary.

## 2016-05-29 NOTE — Progress Notes (Signed)
Subjective:    Patient ID: Rebecca Skinner, female    DOB: 01-14-1923, 82 y.o.   MRN: [id number]  DOS:  05/29/2016 Type of visit - description : cpx, Here with her daughter Rebecca Skinner Interval history: DM-recently Rebecca Skinner was discontinued Atrial fibrillation, recently metoprolol dose decreased Had a fall 03-2016, since then she is having some shoulder pain. Complaining of decreased vision due to cataracts, wonders about surgery. Developed a skin lesion 4 weeks ago at the right face. GERD: Okay to decrease PPIs?.    Review of Systems  Constitutional: No fever. No chills. No unexplained wt changes. No unusual sweats  HEENT: No dental problems, no ear discharge, no facial swelling, no voice changes.     Respiratory: No wheezing , breathing is at baseline. No cough , no mucus production  Cardiovascular: No CP  GI:  Went to the urgent care a few days ago with stomach pain, x-ray was negative. At this point pain is better, still in the right side, not painful unless the area is touch. Appetite is normal, no nausea, vomiting, diarrhea, blood in the stools. No urinary symptoms. No constipation.   Endocrine: No polyphagia, no polyuria , no polydipsia  GU: No dysuria, gross hematuria, difficulty urinating. No urinary urgency, no frequency.  Musculoskeletal: No joint swellings or unusual aches or pains  Skin: No change in the color of the skin, palor , no  Rash  Allergic, immunologic: No environmental allergies , no  food allergies  Neurological: No dizziness no  syncope. No headaches. No diplopia, no slurred, no slurred speech, no motor deficits, no facial  Numbness  Hematological: No enlarged lymph nodes, no easy bruising , no unusual bleedings  Psychiatry: Has memory issues on and off daughter reports, stable, no getting worse.  No unusual/severe anxiety, no depression    Past Medical History:  Diagnosis Date  . Anxiety 11/20/2011  . Asthma    [facility] Rebecca Skinner  . CAD  (coronary artery disease)    s/p CABG s/p AO valve replacement ([place] [facility])  . CVA (cerebral infarction) 08-2013   multiple, L, d/t Afib, started eloquis  . Diabetes mellitus    type 2   . Dizziness    Chronic, admiet 07-2010,saw neuro, thought to be a peripheral issue   . Dry skin   . GERD (gastroesophageal reflux disease)   . Hyperlipidemia   . Hypertension   . Hyperthyroidism   . Keloid    @ chest  . Memory loss   . Osteoarthritis   . Osteopenia    per dexa 12/09  . Paroxysmal atrial fibrillation ([place])    cards d/c coumadin 12/2008 d/t persisten NSR and frequent falls- restarted coumadin july 2011, now on Eliquis  . Recurrent UTI   . Shortness of breath dyspnea   . TIA (transient ischemic attack)     Past Surgical History:  Procedure Laterality Date  . ABDOMINAL HYSTERECTOMY    . AORTIC VALVE REPLACEMENT  1998  . cagb  1998  . CAROTID DOPPLER  07/13/12   BILATERAL BULB/PROXIMAL ICAS;MILD AMOUNT OF FIBROUS PLAQUE WITH NO DIAMETER REDUCTION.  . CESAREAN SECTION     x 2  . COLONOSCOPY WITH PROPOFOL N/A 03/30/2014   Procedure: COLONOSCOPY WITH PROPOFOL;  Surgeon: Rebecca Shipper, MD;  Location: [facility];  Service: Endoscopy;  Laterality: N/A;  . CORONARY ARTERY BYPASS GRAFT  1998   SVG TO RCA  . HEMORRHOID SURGERY    . MYOCARDIAL PERFUSION STUDY  12/19/09   NORMAL PATTERN OF PERFUSION IN ALL REGIONS.EF 75%.  . OOPHORECTOMY    . TOTAL KNEE ARTHROPLASTY  1999  . TRANSTHORACIC ECHOCARDIOGRAM  09/11/11   LVEF >55%.STAGE 1 DIASTOLIC DYSFUNCTION,ELEVATED LV FILLING PRESSURE.BIOPROSTHETIC AORTIC VALVE-PEAK AND MEAN GRADIENTS OF 17 MMHG AND 8 MMHG.SIGMOID SEPTUM.MILD TO MOD MR.MILD TO MOD TR.RVSP 46 MMHG.    Social History   Social History  . Marital status: Divorced    Spouse name: N/A  . Number of children: 4  . Years of education: N/A   Occupational History  . retired  Retired   Social History Main Topics  . Smoking status: Former Research scientist (life sciences)  . Smokeless tobacco:  Never Used     Comment: quit 2004, smoked 1.5 ppd  . Alcohol use No  . Drug use: No  . Sexual activity: Not on file   Other Topics Concern  . Not on file   Social History Narrative   Had to moved w/ her daughter Rebecca Skinner)  ~ 01-2016   Had 3 daughter- 2 son  (lost oldest daughter and son), 2 living daughters in [place]     Family History  Problem Relation Age of Onset  . Heart attack Father   . Coronary artery disease Son 76    deceased  . Hypertension Brother   . Stroke Brother   . Colon cancer Neg Hx   . Breast cancer Neg Hx   . Thyroid disease Neg Hx      Allergies as of 05/29/2016      Reactions   Hydrocodone Itching, Nausea Only   Tramadol Hcl Itching, Nausea Only      Medication List       Accurate as of 05/29/16 11:59 PM. Always use your most recent med list.          acetaminophen 500 MG tablet Commonly known as:  TYLENOL Take 1,000 mg by mouth every 6 (six) hours as needed for moderate pain (pain).   albuterol 108 (90 Base) MCG/ACT inhaler Commonly known as:  PROAIR HFA Inhale 2 puffs into the lungs every 6 (six) hours as needed for wheezing or shortness of breath.   ALPRAZolam 0.5 MG tablet Commonly known as:  XANAX Take 1 tablet (0.5 mg total) by mouth at bedtime as needed for anxiety or sleep.   artificial tears Oint ophthalmic ointment Place into both eyes at bedtime as needed for dry eyes.   aspirin 81 MG tablet Take 81 mg by mouth daily.   atorvastatin 40 MG tablet Commonly known as:  LIPITOR Take 1 tablet (40 mg total) by mouth at bedtime.   benazepril 20 MG tablet Commonly known as:  LOTENSIN Take 1 tablet (20 mg total) by mouth daily.   budesonide 0.5 MG/2ML nebulizer solution Commonly known as:  PULMICORT Take 2 mLs (0.5 mg total) by nebulization 2 (two) times daily.   CALCIUM 500 PO Take 500 mg by mouth at bedtime.   cholecalciferol 1000 units tablet Commonly known as:  VITAMIN D Take 1,000 Units by mouth at bedtime.     diltiazem 120 MG 12 hr capsule Commonly known as:  CARDIZEM SR TAKE ONE CAPSULE BY MOUTH TWICE A DAY   dorzolamide-timolol 22.3-6.8 MG/ML ophthalmic solution Commonly known as:  COSOPT Place 1 drop into both eyes 2 (two) times daily.   esomeprazole 40 MG capsule Commonly known as:  NEXIUM Take 1 capsule (40 mg total) by mouth daily before breakfast.   furosemide 20 MG tablet Commonly known as:  LASIX Take 2 tablets (40 mg total) by mouth daily.   levalbuterol 0.63 MG/3ML nebulizer solution Commonly known as:  XOPENEX Take 3 mLs (0.63 mg total) by nebulization 2 (two) times daily.   magnesium oxide 400 (241.3 Mg) MG tablet Commonly known as:  MAG-OX Take 1 tablet (400 mg total) by mouth daily.   methimazole 5 MG tablet Commonly known as:  TAPAZOLE Take 1 tablet (5 mg total) by mouth 3 (three) times a week.   metoprolol tartrate 25 MG tablet Commonly known as:  LOPRESSOR Take 1 tablet (25 mg total) by mouth 2 (two) times daily.   nitroGLYCERIN 0.4 MG SL tablet Commonly known as:  NITROSTAT Place 0.4 mg under the tongue every 5 (five) minutes as needed for chest pain (chest pain). Reported on 11/10/2015   PAZEO 0.7 % Soln Generic drug:  Olopatadine HCl   potassium chloride 10 MEQ tablet Commonly known as:  KLOR-CON M10 Take 1 tablet (10 mEq total) by mouth daily.   RESTASIS MULTIDOSE 0.05 % ophthalmic emulsion Generic drug:  cycloSPORINE   TRAVATAN Z 0.004 % Soln ophthalmic solution Generic drug:  Travoprost (BAK Free) Place 1 drop into both eyes at bedtime.          Objective:   Physical Exam BP 122/78 (BP Location: Left Arm, Patient Position: Sitting, Cuff Size: Normal)   Pulse (!) 47   Temp 98.1 F (36.7 C) (Oral)   Resp 14   Ht 5\' 1"  (1.549 m)   Wt 170 lb 8 oz (77.3 kg)   SpO2 98%   BMI 32.22 kg/m  General:   Well developed, well nourished . NAD.  HEENT:  Normocephalic . Face symmetric, atraumatic. Lungs:  Increase. Sounds Normal respiratory  effort, no intercostal retractions, no accessory muscle use. Heart: Irregular, bradycardic, soft systolic murmur.  Abdomen:  Not distended, soft, minimal tenderness to palpation, mostly on the right side. No mass. Exam done the patient is eating as she has a difficult time transferring.   Skin:  Normal at the left face. Neurologic:  alert , he seems oriented, no formal mental exam done today Speech normal, gait  assisted by a rolling walker MSK: Range of motion of the right shoulder is decreased, no obvious deformity. Strength symmetric in all extremities Psych--  Cooperative with normal attention span and concentration.  Behavior appropriate. No anxious or depressed appearing.     Assessment & Plan:   Assessment DM HTN Hyperlipidemia Hyperthyroidism -- used to see Rebecca. Loanne Drilling, last visit 01-2016, now f/u PCP, check labs x 2 q year GERD Asthma  Anxiety  On xanax  CV: Rebecca. Claiborne Billings --CHF, CAD, CABG, Aortic valve replacement --Paroxysmal atrial fibrillation, used to be on Coumadin,   Eliquis rx after the stroke 3-15, d/c 03-2014 d/t GI bleed , on ASA 81 --TIAs, CVA 2015 DJD Osteopenia 2009 Memory deficits  Recurrent UTIs Daughter  Salimah Ottum; lives w/ Nash Dimmer DM-at thes suggestion of a  visiting nurse we discontinue Januvia because she has  poor appetite on-off. I think is okay  Not to restart Januvia, we don't need to very tight control of her blood sugars . HTN: Well-controlled, check a BMP, continue with Lopressor, Lasix, Cardizem, Lotensin. Hyperthyroidism: on tapazole, check a CBC. Last TSH satisfactory GERD: Likes to d/c PPIs, I agree: Decreased to every other day and then is stop if so desired Asthma: aparently taking Xopenex and pulmicort twice a day. Recommend Pulmicort bid and xopenex prn Abdominal pain: No red flag  symptoms, improving. Checking a CBC. If anemia or increased symptoms she will let me know New lesion, left face: Refer to dermatology Shoulder pain: Get a  x-ray, ROM decrease, refer to PT to prevent worsening of decreased AROM. RTC 3 months    In addition to her CPX, I spent more than 35 minutes counseling regards her chronic medical problems and 3 acute issues: Abdominal pain, skin lesions and shoulder pain. We discussed the pros and cons of physical therapy to prevent the loss of range of motion, the need to see dermatology, I also advised her how to stop PPIs.

## 2016-05-29 NOTE — Telephone Encounter (Signed)
AVS states to schedule AWV with RN. Pts daughter states RN from [facility] came to pt home 2 weeks ago for AWV. Does she still need to schedule?

## 2016-05-29 NOTE — Patient Instructions (Addendum)
GO TO THE LAB : Get the blood work     GO TO THE FRONT DESK Schedule your next appointment for a  routine checkup in 3 months.  Please schedule an Medicare wellness with one of our nurses  Asthma: Pulmicort twice a day Xopenex only if wheezing or coughing  Acid reflux: Okay to decrease Nexium to every other day, after a moment okay to stop  If the abdominal pain gets worse or is not getting better let me know  We are referring you to a dermatologist    We are referring you for a physical therapy due to the shoulder pain   STOP BY THE FIRST FLOOR:  get the XR

## 2016-05-29 NOTE — Assessment & Plan Note (Signed)
Td 2014;  pneumonia shot 2009; prevnar 2016; Shingles immunization 01-2012 ; had a flu shot   CCS: patient has declined  further screenings PAP-- --> no further screening  Daughters have her medical POA

## 2016-05-29 NOTE — Telephone Encounter (Signed)
Please advise 

## 2016-05-30 NOTE — Assessment & Plan Note (Signed)
DM-at thes suggestion of a  visiting nurse we discontinue Januvia because she has  poor appetite on-off. I think is okay  Not to restart Januvia, we don't need to very tight control of her blood sugars . HTN: Well-controlled, check a BMP, continue with Lopressor, Lasix, Cardizem, Lotensin. Hyperthyroidism: on tapazole, check a CBC. Last TSH satisfactory GERD: Likes to d/c PPIs, I agree: Decreased to every other day and then is stop if so desired Asthma: aparently taking Xopenex and pulmicort twice a day. Recommend Pulmicort bid and xopenex prn Abdominal pain: No red flag symptoms, improving. Checking a CBC. If anemia or increased symptoms she will let me know New lesion, left face: Refer to dermatology Shoulder pain: Get a x-ray, ROM decrease, refer to PT to prevent worsening of decreased AROM. RTC 3 months

## 2016-06-05 ENCOUNTER — Telehealth: Payer: Self-pay | Admitting: Internal Medicine

## 2016-06-05 DIAGNOSIS — Z9181 History of falling: Secondary | ICD-10-CM | POA: Diagnosis not present

## 2016-06-05 DIAGNOSIS — M25511 Pain in right shoulder: Secondary | ICD-10-CM | POA: Diagnosis not present

## 2016-06-05 DIAGNOSIS — E1165 Type 2 diabetes mellitus with hyperglycemia: Secondary | ICD-10-CM | POA: Diagnosis not present

## 2016-06-05 DIAGNOSIS — R2689 Other abnormalities of gait and mobility: Secondary | ICD-10-CM | POA: Diagnosis not present

## 2016-06-05 NOTE — Telephone Encounter (Signed)
Caller name: Morey Hummingbird Relationship to patient: Radene Journey Can be reached: [phone] Pharmacy:  Reason for call: [facility] needs verbal orders for Nursing Services 2 x a week for 2 weeks and 1 x a week for 1 week.

## 2016-06-05 NOTE — Telephone Encounter (Signed)
Spoke w/ Carrie, verbal orders given.  

## 2016-06-05 NOTE — Telephone Encounter (Signed)
Caller name: Morey Hummingbird Relationship to patient: Can be reached: Pharmacy:  Reason for call:

## 2016-06-06 ENCOUNTER — Other Ambulatory Visit: Payer: Self-pay | Admitting: Internal Medicine

## 2016-06-06 DIAGNOSIS — J45909 Unspecified asthma, uncomplicated: Secondary | ICD-10-CM

## 2016-06-07 ENCOUNTER — Telehealth: Payer: Self-pay | Admitting: Internal Medicine

## 2016-06-07 DIAGNOSIS — M25511 Pain in right shoulder: Secondary | ICD-10-CM | POA: Diagnosis not present

## 2016-06-07 DIAGNOSIS — E1165 Type 2 diabetes mellitus with hyperglycemia: Secondary | ICD-10-CM | POA: Diagnosis not present

## 2016-06-07 DIAGNOSIS — R2689 Other abnormalities of gait and mobility: Secondary | ICD-10-CM | POA: Diagnosis not present

## 2016-06-07 DIAGNOSIS — Z9181 History of falling: Secondary | ICD-10-CM | POA: Diagnosis not present

## 2016-06-07 NOTE — Telephone Encounter (Signed)
Called and left a message for call back  

## 2016-06-07 NOTE — Telephone Encounter (Signed)
Caller name:Robert Junious Silk Relationship to patient: [facility] Can be reached: [phone]   Reason for call: Request verbal order for PT 2 times a week for 4 weeks for balancing, strengthening and transfers

## 2016-06-11 DIAGNOSIS — Z952 Presence of prosthetic heart valve: Secondary | ICD-10-CM | POA: Diagnosis not present

## 2016-06-11 DIAGNOSIS — R413 Other amnesia: Secondary | ICD-10-CM | POA: Diagnosis not present

## 2016-06-11 DIAGNOSIS — E119 Type 2 diabetes mellitus without complications: Secondary | ICD-10-CM | POA: Diagnosis not present

## 2016-06-11 DIAGNOSIS — I480 Paroxysmal atrial fibrillation: Secondary | ICD-10-CM | POA: Diagnosis not present

## 2016-06-11 DIAGNOSIS — Z951 Presence of aortocoronary bypass graft: Secondary | ICD-10-CM | POA: Diagnosis not present

## 2016-06-11 DIAGNOSIS — I2510 Atherosclerotic heart disease of native coronary artery without angina pectoris: Secondary | ICD-10-CM | POA: Diagnosis not present

## 2016-06-11 DIAGNOSIS — I509 Heart failure, unspecified: Secondary | ICD-10-CM | POA: Diagnosis not present

## 2016-06-11 DIAGNOSIS — R2689 Other abnormalities of gait and mobility: Secondary | ICD-10-CM | POA: Diagnosis not present

## 2016-06-11 DIAGNOSIS — M25511 Pain in right shoulder: Secondary | ICD-10-CM | POA: Diagnosis not present

## 2016-06-11 DIAGNOSIS — J45909 Unspecified asthma, uncomplicated: Secondary | ICD-10-CM | POA: Diagnosis not present

## 2016-06-11 DIAGNOSIS — M1990 Unspecified osteoarthritis, unspecified site: Secondary | ICD-10-CM | POA: Diagnosis not present

## 2016-06-11 DIAGNOSIS — I110 Hypertensive heart disease with heart failure: Secondary | ICD-10-CM | POA: Diagnosis not present

## 2016-06-11 DIAGNOSIS — Z9181 History of falling: Secondary | ICD-10-CM | POA: Diagnosis not present

## 2016-06-11 DIAGNOSIS — Z87440 Personal history of urinary (tract) infections: Secondary | ICD-10-CM | POA: Diagnosis not present

## 2016-06-11 DIAGNOSIS — Z7982 Long term (current) use of aspirin: Secondary | ICD-10-CM | POA: Diagnosis not present

## 2016-06-11 DIAGNOSIS — W19XXXD Unspecified fall, subsequent encounter: Secondary | ICD-10-CM | POA: Diagnosis not present

## 2016-06-11 DIAGNOSIS — Z87891 Personal history of nicotine dependence: Secondary | ICD-10-CM | POA: Diagnosis not present

## 2016-06-11 DIAGNOSIS — Z8673 Personal history of transient ischemic attack (TIA), and cerebral infarction without residual deficits: Secondary | ICD-10-CM | POA: Diagnosis not present

## 2016-06-11 NOTE — Telephone Encounter (Signed)
Called again and left a message for callback.

## 2016-06-12 DIAGNOSIS — R413 Other amnesia: Secondary | ICD-10-CM | POA: Diagnosis not present

## 2016-06-12 DIAGNOSIS — Z952 Presence of prosthetic heart valve: Secondary | ICD-10-CM | POA: Diagnosis not present

## 2016-06-12 DIAGNOSIS — E119 Type 2 diabetes mellitus without complications: Secondary | ICD-10-CM | POA: Diagnosis not present

## 2016-06-12 DIAGNOSIS — I2510 Atherosclerotic heart disease of native coronary artery without angina pectoris: Secondary | ICD-10-CM | POA: Diagnosis not present

## 2016-06-12 DIAGNOSIS — I509 Heart failure, unspecified: Secondary | ICD-10-CM | POA: Diagnosis not present

## 2016-06-12 DIAGNOSIS — Z87440 Personal history of urinary (tract) infections: Secondary | ICD-10-CM | POA: Diagnosis not present

## 2016-06-12 DIAGNOSIS — M1990 Unspecified osteoarthritis, unspecified site: Secondary | ICD-10-CM | POA: Diagnosis not present

## 2016-06-12 DIAGNOSIS — Z7982 Long term (current) use of aspirin: Secondary | ICD-10-CM | POA: Diagnosis not present

## 2016-06-12 DIAGNOSIS — I480 Paroxysmal atrial fibrillation: Secondary | ICD-10-CM | POA: Diagnosis not present

## 2016-06-12 DIAGNOSIS — Z87891 Personal history of nicotine dependence: Secondary | ICD-10-CM | POA: Diagnosis not present

## 2016-06-12 DIAGNOSIS — M25511 Pain in right shoulder: Secondary | ICD-10-CM | POA: Diagnosis not present

## 2016-06-12 DIAGNOSIS — W19XXXD Unspecified fall, subsequent encounter: Secondary | ICD-10-CM | POA: Diagnosis not present

## 2016-06-12 DIAGNOSIS — Z951 Presence of aortocoronary bypass graft: Secondary | ICD-10-CM | POA: Diagnosis not present

## 2016-06-12 DIAGNOSIS — J45909 Unspecified asthma, uncomplicated: Secondary | ICD-10-CM | POA: Diagnosis not present

## 2016-06-12 DIAGNOSIS — Z8673 Personal history of transient ischemic attack (TIA), and cerebral infarction without residual deficits: Secondary | ICD-10-CM | POA: Diagnosis not present

## 2016-06-12 DIAGNOSIS — Z9181 History of falling: Secondary | ICD-10-CM | POA: Diagnosis not present

## 2016-06-12 DIAGNOSIS — R2689 Other abnormalities of gait and mobility: Secondary | ICD-10-CM | POA: Diagnosis not present

## 2016-06-12 DIAGNOSIS — I110 Hypertensive heart disease with heart failure: Secondary | ICD-10-CM | POA: Diagnosis not present

## 2016-06-13 NOTE — Telephone Encounter (Signed)
No call back.  Message closed.

## 2016-06-14 DIAGNOSIS — I480 Paroxysmal atrial fibrillation: Secondary | ICD-10-CM | POA: Diagnosis not present

## 2016-06-14 DIAGNOSIS — M25511 Pain in right shoulder: Secondary | ICD-10-CM | POA: Diagnosis not present

## 2016-06-14 DIAGNOSIS — Z8673 Personal history of transient ischemic attack (TIA), and cerebral infarction without residual deficits: Secondary | ICD-10-CM | POA: Diagnosis not present

## 2016-06-14 DIAGNOSIS — I2510 Atherosclerotic heart disease of native coronary artery without angina pectoris: Secondary | ICD-10-CM | POA: Diagnosis not present

## 2016-06-14 DIAGNOSIS — Z951 Presence of aortocoronary bypass graft: Secondary | ICD-10-CM | POA: Diagnosis not present

## 2016-06-14 DIAGNOSIS — E119 Type 2 diabetes mellitus without complications: Secondary | ICD-10-CM | POA: Diagnosis not present

## 2016-06-14 DIAGNOSIS — Z87891 Personal history of nicotine dependence: Secondary | ICD-10-CM | POA: Diagnosis not present

## 2016-06-14 DIAGNOSIS — I110 Hypertensive heart disease with heart failure: Secondary | ICD-10-CM | POA: Diagnosis not present

## 2016-06-14 DIAGNOSIS — R413 Other amnesia: Secondary | ICD-10-CM | POA: Diagnosis not present

## 2016-06-14 DIAGNOSIS — Z87440 Personal history of urinary (tract) infections: Secondary | ICD-10-CM | POA: Diagnosis not present

## 2016-06-14 DIAGNOSIS — I509 Heart failure, unspecified: Secondary | ICD-10-CM | POA: Diagnosis not present

## 2016-06-14 DIAGNOSIS — Z7982 Long term (current) use of aspirin: Secondary | ICD-10-CM | POA: Diagnosis not present

## 2016-06-14 DIAGNOSIS — Z952 Presence of prosthetic heart valve: Secondary | ICD-10-CM | POA: Diagnosis not present

## 2016-06-14 DIAGNOSIS — J45909 Unspecified asthma, uncomplicated: Secondary | ICD-10-CM | POA: Diagnosis not present

## 2016-06-14 DIAGNOSIS — W19XXXD Unspecified fall, subsequent encounter: Secondary | ICD-10-CM | POA: Diagnosis not present

## 2016-06-14 DIAGNOSIS — R2689 Other abnormalities of gait and mobility: Secondary | ICD-10-CM | POA: Diagnosis not present

## 2016-06-14 DIAGNOSIS — Z9181 History of falling: Secondary | ICD-10-CM | POA: Diagnosis not present

## 2016-06-14 DIAGNOSIS — M1990 Unspecified osteoarthritis, unspecified site: Secondary | ICD-10-CM | POA: Diagnosis not present

## 2016-06-17 DIAGNOSIS — J4550 Severe persistent asthma, uncomplicated: Secondary | ICD-10-CM | POA: Diagnosis not present

## 2016-06-19 DIAGNOSIS — Z9181 History of falling: Secondary | ICD-10-CM | POA: Diagnosis not present

## 2016-06-19 DIAGNOSIS — Z87891 Personal history of nicotine dependence: Secondary | ICD-10-CM | POA: Diagnosis not present

## 2016-06-19 DIAGNOSIS — Z7982 Long term (current) use of aspirin: Secondary | ICD-10-CM | POA: Diagnosis not present

## 2016-06-19 DIAGNOSIS — E119 Type 2 diabetes mellitus without complications: Secondary | ICD-10-CM | POA: Diagnosis not present

## 2016-06-19 DIAGNOSIS — I480 Paroxysmal atrial fibrillation: Secondary | ICD-10-CM | POA: Diagnosis not present

## 2016-06-19 DIAGNOSIS — W19XXXD Unspecified fall, subsequent encounter: Secondary | ICD-10-CM | POA: Diagnosis not present

## 2016-06-19 DIAGNOSIS — I2510 Atherosclerotic heart disease of native coronary artery without angina pectoris: Secondary | ICD-10-CM | POA: Diagnosis not present

## 2016-06-19 DIAGNOSIS — R2689 Other abnormalities of gait and mobility: Secondary | ICD-10-CM | POA: Diagnosis not present

## 2016-06-19 DIAGNOSIS — M1990 Unspecified osteoarthritis, unspecified site: Secondary | ICD-10-CM | POA: Diagnosis not present

## 2016-06-19 DIAGNOSIS — Z87440 Personal history of urinary (tract) infections: Secondary | ICD-10-CM | POA: Diagnosis not present

## 2016-06-19 DIAGNOSIS — R413 Other amnesia: Secondary | ICD-10-CM | POA: Diagnosis not present

## 2016-06-19 DIAGNOSIS — I110 Hypertensive heart disease with heart failure: Secondary | ICD-10-CM | POA: Diagnosis not present

## 2016-06-19 DIAGNOSIS — Z8673 Personal history of transient ischemic attack (TIA), and cerebral infarction without residual deficits: Secondary | ICD-10-CM | POA: Diagnosis not present

## 2016-06-19 DIAGNOSIS — J45909 Unspecified asthma, uncomplicated: Secondary | ICD-10-CM | POA: Diagnosis not present

## 2016-06-19 DIAGNOSIS — M25511 Pain in right shoulder: Secondary | ICD-10-CM | POA: Diagnosis not present

## 2016-06-19 DIAGNOSIS — I509 Heart failure, unspecified: Secondary | ICD-10-CM | POA: Diagnosis not present

## 2016-06-19 DIAGNOSIS — Z952 Presence of prosthetic heart valve: Secondary | ICD-10-CM | POA: Diagnosis not present

## 2016-06-19 DIAGNOSIS — Z951 Presence of aortocoronary bypass graft: Secondary | ICD-10-CM | POA: Diagnosis not present

## 2016-06-21 ENCOUNTER — Encounter: Payer: Self-pay | Admitting: Internal Medicine

## 2016-06-21 ENCOUNTER — Ambulatory Visit (INDEPENDENT_AMBULATORY_CARE_PROVIDER_SITE_OTHER): Payer: Medicare Other | Admitting: Internal Medicine

## 2016-06-21 ENCOUNTER — Ambulatory Visit (HOSPITAL_BASED_OUTPATIENT_CLINIC_OR_DEPARTMENT_OTHER)
Admission: RE | Admit: 2016-06-21 | Discharge: 2016-06-21 | Disposition: A | Discharge status: Home / Self Care | Payer: Medicare Other | Source: Ambulatory Visit | Attending: Internal Medicine | Admitting: Internal Medicine

## 2016-06-21 VITALS — BP 128/74 | HR 87 | Temp 98.7°F | Resp 14 | Ht 61.0 in | Wt 171.5 lb

## 2016-06-21 DIAGNOSIS — I517 Cardiomegaly: Secondary | ICD-10-CM | POA: Insufficient documentation

## 2016-06-21 DIAGNOSIS — Z952 Presence of prosthetic heart valve: Secondary | ICD-10-CM | POA: Diagnosis not present

## 2016-06-21 DIAGNOSIS — R05 Cough: Secondary | ICD-10-CM | POA: Insufficient documentation

## 2016-06-21 DIAGNOSIS — Z951 Presence of aortocoronary bypass graft: Secondary | ICD-10-CM | POA: Diagnosis not present

## 2016-06-21 DIAGNOSIS — J4541 Moderate persistent asthma with (acute) exacerbation: Secondary | ICD-10-CM

## 2016-06-21 DIAGNOSIS — R062 Wheezing: Secondary | ICD-10-CM | POA: Diagnosis not present

## 2016-06-21 DIAGNOSIS — R059 Cough, unspecified: Secondary | ICD-10-CM

## 2016-06-21 DIAGNOSIS — I700 Atherosclerosis of aorta: Secondary | ICD-10-CM | POA: Diagnosis not present

## 2016-06-21 MED ORDER — PREDNISONE 10 MG PO TABS: ORAL_TABLET | ORAL | 0 refills | Status: DC

## 2016-06-21 MED ORDER — ALBUTEROL SULFATE (2.5 MG/3ML) 0.083% IN NEBU
2.5000 mg | INHALATION_SOLUTION | Freq: Once | RESPIRATORY_TRACT | Status: AC
  Administered 2016-06-21: 2.5 mg via RESPIRATORY_TRACT

## 2016-06-21 MED ORDER — AZITHROMYCIN 250 MG PO TABS: ORAL_TABLET | ORAL | 0 refills | Status: DC

## 2016-06-21 NOTE — Patient Instructions (Addendum)
   Rest, fluids , tylenol  For cough:  Take Robitussin DM as needed, follow instructions in the box  For nasal congestion: Use OTC Nasocort or Flonase : 2 nasal sprays on each side of the nose in the morning until you feel better    For wheezing: --Continue taking Pulmicort or budesonide   twice a day every day --Take Xopenex or LEVALBUTEROL nebulizations  every 6 hours as needed   Avoid decongestants such as  Pseudoephedrine or phenylephrine    Take the antibiotic as prescribed  (zithromax)   Call if not gradually better over the next  10 days  Call anytime if the symptoms are severe

## 2016-06-21 NOTE — Progress Notes (Signed)
Subjective:    Patient ID: Rebecca Skinner, female    DOB: 25-Jan-1923, 83 y.o.   MRN: [id number]  DOS:  06/21/2016 Type of visit - description : Acute visit, here with her daughter [name] Interval history: Symptoms started 3 days ago with increased cough from baseline, 2 days ago a nurse visited her, she was relatively okay. Today, she definitely worse with increased wheezing. Had a neb treatment today at 10 AM. Not taking any additional medications other than what she is prescribed   Review of Systems  Had subjective fever last night. Denies chest pain, nausea, vomiting. No unusual aches No lower extremity edema Shortness of breath is above baseline   Past Medical History:  Diagnosis Date  . Anxiety 11/20/2011  . Asthma    UNDER [facility] DR PAZ  . CAD (coronary artery disease)    s/p CABG s/p AO valve replacement ([place] [facility])  . CVA (cerebral infarction) 08-2013   multiple, L, d/t Afib, started eloquis  . Diabetes mellitus    type 2   . Dizziness    Chronic, admiet 07-2010,saw neuro, thought to be a peripheral issue   . Dry skin   . GERD (gastroesophageal reflux disease)   . Hyperlipidemia   . Hypertension   . Hyperthyroidism   . Keloid    @ chest  . Memory loss   . Osteoarthritis   . Osteopenia    per dexa 12/09  . Paroxysmal atrial fibrillation ([place])    cards d/c coumadin 12/2008 d/t persisten NSR and frequent falls- restarted coumadin july 2011, now on Eliquis  . Recurrent UTI   . Shortness of breath dyspnea   . TIA (transient ischemic attack)     Past Surgical History:  Procedure Laterality Date  . ABDOMINAL HYSTERECTOMY    . AORTIC VALVE REPLACEMENT  1998  . cagb  1998  . CAROTID DOPPLER  07/13/12   BILATERAL BULB/PROXIMAL ICAS;MILD AMOUNT OF FIBROUS PLAQUE WITH NO DIAMETER REDUCTION.  . CESAREAN SECTION     x 2  . COLONOSCOPY WITH PROPOFOL N/A 03/30/2014   Procedure: COLONOSCOPY WITH PROPOFOL;  Surgeon: Irene Shipper, MD;  Location: [facility];   Service: Endoscopy;  Laterality: N/A;  . CORONARY ARTERY BYPASS GRAFT  1998   SVG TO RCA  . HEMORRHOID SURGERY    . MYOCARDIAL PERFUSION STUDY  12/19/09   NORMAL PATTERN OF PERFUSION IN ALL REGIONS.EF 75%.  . OOPHORECTOMY    . TOTAL KNEE ARTHROPLASTY  1999  . TRANSTHORACIC ECHOCARDIOGRAM  09/11/11   LVEF >55%.STAGE 1 DIASTOLIC DYSFUNCTION,ELEVATED LV FILLING PRESSURE.BIOPROSTHETIC AORTIC VALVE-PEAK AND MEAN GRADIENTS OF 17 MMHG AND 8 MMHG.SIGMOID SEPTUM.MILD TO MOD MR.MILD TO MOD TR.RVSP 46 MMHG.    Social History   Social History  . Marital status: Divorced    Spouse name: N/A  . Number of children: 4  . Years of education: N/A   Occupational History  . retired  Retired   Social History Main Topics  . Smoking status: Former Research scientist (life sciences)  . Smokeless tobacco: Never Used     Comment: quit 2004, smoked 1.5 ppd  . Alcohol use No  . Drug use: No  . Sexual activity: Not on file   Other Topics Concern  . Not on file   Social History Narrative   Had to moved w/ her daughter [name])  ~ 01-2016   Had 3 daughter- 2 son  (lost oldest daughter and son), 2 living daughters in [place]  Allergies as of 06/21/2016      Reactions   Hydrocodone Itching, Nausea Only   Tramadol Hcl Itching, Nausea Only      Medication List       Accurate as of 06/21/16 11:59 PM. Always use your most recent med list.          acetaminophen 500 MG tablet Commonly known as:  TYLENOL Take 1,000 mg by mouth every 6 (six) hours as needed for moderate pain (pain).   albuterol 108 (90 Base) MCG/ACT inhaler Commonly known as:  PROAIR HFA Inhale 2 puffs into the lungs every 6 (six) hours as needed for wheezing or shortness of breath.   ALPRAZolam 0.5 MG tablet Commonly known as:  XANAX Take 1 tablet (0.5 mg total) by mouth at bedtime as needed for anxiety or sleep.   artificial tears Oint ophthalmic ointment Place into both eyes at bedtime as needed for dry eyes.   aspirin 81 MG tablet Take 81 mg by  mouth daily.   atorvastatin 40 MG tablet Commonly known as:  LIPITOR Take 1 tablet (40 mg total) by mouth at bedtime.   azithromycin 250 MG tablet Commonly known as:  ZITHROMAX Z-PAK 2 tabs a day the first day, then 1 tab a day x 4 days   benazepril 20 MG tablet Commonly known as:  LOTENSIN Take 1 tablet (20 mg total) by mouth daily.   budesonide 0.5 MG/2ML nebulizer solution Commonly known as:  PULMICORT USE 1 VIAL VIA NEBULIZER TWICE A DAY   CALCIUM 500 PO Take 500 mg by mouth at bedtime.   cholecalciferol 1000 units tablet Commonly known as:  VITAMIN D Take 1,000 Units by mouth at bedtime.   diltiazem 120 MG 12 hr capsule Commonly known as:  CARDIZEM SR TAKE ONE CAPSULE BY MOUTH TWICE A DAY   dorzolamide-timolol 22.3-6.8 MG/ML ophthalmic solution Commonly known as:  COSOPT Place 1 drop into both eyes 2 (two) times daily.   esomeprazole 40 MG capsule Commonly known as:  NEXIUM Take 1 capsule (40 mg total) by mouth daily before breakfast.   furosemide 20 MG tablet Commonly known as:  LASIX Take 2 tablets (40 mg total) by mouth daily.   levalbuterol 0.63 MG/3ML nebulizer solution Commonly known as:  XOPENEX Take 3 mLs (0.63 mg total) by nebulization 2 (two) times daily.   magnesium oxide 400 (241.3 Mg) MG tablet Commonly known as:  MAG-OX Take 1 tablet (400 mg total) by mouth daily.   methimazole 5 MG tablet Commonly known as:  TAPAZOLE Take 1 tablet (5 mg total) by mouth 3 (three) times a week.   metoprolol tartrate 25 MG tablet Commonly known as:  LOPRESSOR Take 1 tablet (25 mg total) by mouth 2 (two) times daily.   nitroGLYCERIN 0.4 MG SL tablet Commonly known as:  NITROSTAT Place 0.4 mg under the tongue every 5 (five) minutes as needed for chest pain (chest pain). Reported on 11/10/2015   PAZEO 0.7 % Soln Generic drug:  Olopatadine HCl   potassium chloride 10 MEQ tablet Commonly known as:  KLOR-CON M10 Take 1 tablet (10 mEq total) by mouth daily.     predniSONE 10 MG tablet Commonly known as:  DELTASONE 4 tablets x 2 days, 3 tabs x 2 days, 2 tabs x 2 days, 1 tab x 2 days   RESTASIS MULTIDOSE 0.05 % ophthalmic emulsion Generic drug:  cycloSPORINE   TRAVATAN Z 0.004 % Soln ophthalmic solution Generic drug:  Travoprost (BAK Free) Place 1 drop  into both eyes at bedtime.          Objective:   Physical Exam BP 128/74 (BP Location: Left Arm, Patient Position: Sitting, Cuff Size: Normal)   Pulse 87   Temp 98.7 F (37.1 C) (Oral)   Resp 14   Ht 5\' 1"  (1.549 m)   Wt 171 lb 8 oz (77.8 kg)   SpO2 95%   BMI 32.40 kg/m  General:   Well developed, elderly lady, sitting in a wheelchair, audible wheezing but no crease work of breathing.  HEENT:  Normocephalic . Face symmetric, atraumatic Lungs:  Wheezing bilaterally, few rhonchi. No obvious crackles. Normal respiratory effort  Heart: irreg,  no murmur.  No pretibial edema bilaterally  Skin: Not pale. Not jaundice Neurologic:  alert & oriented X3.  Speech normal  Psych--  Cognition and judgment appear intact.  Cooperative with normal attention span and concentration.  Behavior appropriate. No anxious or depressed appearing.      Assessment & Plan:   Assessment DM HTN Hyperlipidemia Hyperthyroidism -- used to see Dr. Loanne Drilling, last visit 01-2016, now f/u PCP, check labs x 2 q year GERD Asthma  Anxiety  On xanax  CV: Dr. Claiborne Billings --CHF, CAD, CABG, Aortic valve replacement --Paroxysmal atrial fibrillation, used to be on Coumadin,   Eliquis rx after the stroke 3-15, d/c 03-2014 d/t GI bleed , on ASA 81 --TIAs, CVA 2015 DJD Osteopenia 2009 Memory deficits  Recurrent UTIs Daughter  Natiyah Haas; lives w/ [name]  PLAN Asthma exacerbation: Presents with asthma exacerbation, chest x-ray showed no pneumonia, got albuterol nebulization. After that, O2 sat remained 96%, wheezing  decreased, she is in no distress. Recommend Robitussin, Pulmicort, Xopenex, Zithromax, prednisone. Call  if no better, ER if worse. all instructions were d/w pt and her daughter. They verbalized understanding. See AVS

## 2016-06-21 NOTE — Progress Notes (Signed)
Pre visit review using our clinic review tool, if applicable. No additional management support is needed unless otherwise documented below in the visit note. 

## 2016-06-23 NOTE — Assessment & Plan Note (Signed)
Asthma exacerbation: Presents with asthma exacerbation, chest x-ray showed no pneumonia, got albuterol nebulization. After that, O2 sat remained 96%, wheezing  decreased, she is in no distress. Recommend Robitussin, Pulmicort, Xopenex, Zithromax, prednisone. Call if no better, ER if worse. all instructions were d/w pt and her daughter. They verbalized understanding. See AVS

## 2016-06-24 DIAGNOSIS — Z87891 Personal history of nicotine dependence: Secondary | ICD-10-CM | POA: Diagnosis not present

## 2016-06-24 DIAGNOSIS — Z9181 History of falling: Secondary | ICD-10-CM | POA: Diagnosis not present

## 2016-06-24 DIAGNOSIS — I2510 Atherosclerotic heart disease of native coronary artery without angina pectoris: Secondary | ICD-10-CM | POA: Diagnosis not present

## 2016-06-24 DIAGNOSIS — Z87440 Personal history of urinary (tract) infections: Secondary | ICD-10-CM | POA: Diagnosis not present

## 2016-06-24 DIAGNOSIS — R2689 Other abnormalities of gait and mobility: Secondary | ICD-10-CM | POA: Diagnosis not present

## 2016-06-24 DIAGNOSIS — I480 Paroxysmal atrial fibrillation: Secondary | ICD-10-CM | POA: Diagnosis not present

## 2016-06-24 DIAGNOSIS — M25511 Pain in right shoulder: Secondary | ICD-10-CM | POA: Diagnosis not present

## 2016-06-24 DIAGNOSIS — R413 Other amnesia: Secondary | ICD-10-CM | POA: Diagnosis not present

## 2016-06-24 DIAGNOSIS — E119 Type 2 diabetes mellitus without complications: Secondary | ICD-10-CM | POA: Diagnosis not present

## 2016-06-24 DIAGNOSIS — Z7982 Long term (current) use of aspirin: Secondary | ICD-10-CM | POA: Diagnosis not present

## 2016-06-24 DIAGNOSIS — W19XXXD Unspecified fall, subsequent encounter: Secondary | ICD-10-CM | POA: Diagnosis not present

## 2016-06-24 DIAGNOSIS — I110 Hypertensive heart disease with heart failure: Secondary | ICD-10-CM | POA: Diagnosis not present

## 2016-06-24 DIAGNOSIS — M1990 Unspecified osteoarthritis, unspecified site: Secondary | ICD-10-CM | POA: Diagnosis not present

## 2016-06-24 DIAGNOSIS — Z952 Presence of prosthetic heart valve: Secondary | ICD-10-CM | POA: Diagnosis not present

## 2016-06-24 DIAGNOSIS — I509 Heart failure, unspecified: Secondary | ICD-10-CM | POA: Diagnosis not present

## 2016-06-24 DIAGNOSIS — Z8673 Personal history of transient ischemic attack (TIA), and cerebral infarction without residual deficits: Secondary | ICD-10-CM | POA: Diagnosis not present

## 2016-06-24 DIAGNOSIS — J45909 Unspecified asthma, uncomplicated: Secondary | ICD-10-CM | POA: Diagnosis not present

## 2016-06-24 DIAGNOSIS — Z951 Presence of aortocoronary bypass graft: Secondary | ICD-10-CM | POA: Diagnosis not present

## 2016-06-28 ENCOUNTER — Telehealth: Payer: Self-pay | Admitting: Internal Medicine

## 2016-06-28 NOTE — Telephone Encounter (Signed)
LMOM w/ verbal orders.  

## 2016-06-28 NOTE — Telephone Encounter (Signed)
Caller name: Herbie Baltimore  Relation to pt: PT from [facility]  Call back number: [phone]    Reason for call:  Requesting verbal orders PT 1x 1 due to weather

## 2016-07-01 ENCOUNTER — Telehealth: Payer: Self-pay | Admitting: Internal Medicine

## 2016-07-01 DIAGNOSIS — Z951 Presence of aortocoronary bypass graft: Secondary | ICD-10-CM | POA: Diagnosis not present

## 2016-07-01 DIAGNOSIS — R2689 Other abnormalities of gait and mobility: Secondary | ICD-10-CM | POA: Diagnosis not present

## 2016-07-01 DIAGNOSIS — Z7982 Long term (current) use of aspirin: Secondary | ICD-10-CM | POA: Diagnosis not present

## 2016-07-01 DIAGNOSIS — J45909 Unspecified asthma, uncomplicated: Secondary | ICD-10-CM | POA: Diagnosis not present

## 2016-07-01 DIAGNOSIS — I480 Paroxysmal atrial fibrillation: Secondary | ICD-10-CM | POA: Diagnosis not present

## 2016-07-01 DIAGNOSIS — E119 Type 2 diabetes mellitus without complications: Secondary | ICD-10-CM | POA: Diagnosis not present

## 2016-07-01 DIAGNOSIS — M25511 Pain in right shoulder: Secondary | ICD-10-CM | POA: Diagnosis not present

## 2016-07-01 DIAGNOSIS — I110 Hypertensive heart disease with heart failure: Secondary | ICD-10-CM | POA: Diagnosis not present

## 2016-07-01 DIAGNOSIS — Z87891 Personal history of nicotine dependence: Secondary | ICD-10-CM | POA: Diagnosis not present

## 2016-07-01 DIAGNOSIS — R413 Other amnesia: Secondary | ICD-10-CM | POA: Diagnosis not present

## 2016-07-01 DIAGNOSIS — I509 Heart failure, unspecified: Secondary | ICD-10-CM | POA: Diagnosis not present

## 2016-07-01 DIAGNOSIS — Z952 Presence of prosthetic heart valve: Secondary | ICD-10-CM | POA: Diagnosis not present

## 2016-07-01 DIAGNOSIS — I2510 Atherosclerotic heart disease of native coronary artery without angina pectoris: Secondary | ICD-10-CM | POA: Diagnosis not present

## 2016-07-01 DIAGNOSIS — Z9181 History of falling: Secondary | ICD-10-CM | POA: Diagnosis not present

## 2016-07-01 DIAGNOSIS — W19XXXD Unspecified fall, subsequent encounter: Secondary | ICD-10-CM | POA: Diagnosis not present

## 2016-07-01 DIAGNOSIS — M1990 Unspecified osteoarthritis, unspecified site: Secondary | ICD-10-CM | POA: Diagnosis not present

## 2016-07-01 DIAGNOSIS — Z87440 Personal history of urinary (tract) infections: Secondary | ICD-10-CM | POA: Diagnosis not present

## 2016-07-01 DIAGNOSIS — Z8673 Personal history of transient ischemic attack (TIA), and cerebral infarction without residual deficits: Secondary | ICD-10-CM | POA: Diagnosis not present

## 2016-07-01 MED ORDER — PREDNISONE 10 MG PO TABS
10.0000 mg | ORAL_TABLET | Freq: Two times a day (BID) | ORAL | 0 refills | Status: DC
Start: 2016-07-01 — End: 2016-08-28

## 2016-07-01 NOTE — Telephone Encounter (Signed)
Pt seen 06/21/2016, please advise.

## 2016-07-01 NOTE — Telephone Encounter (Signed)
Please call them back: If she has improved and is not running fever, no more antibiotics are needed. I would recommend: -a second round of prednisone 10 mg 2 tablets daily 5 days #10 no refills. Call rx  -Continue Pulmicort twice a day -Continue Xopenex, if she is wheezing is okay to do that every 4-6 hours - if she is not back to baseline in 4-5 days call the office, call anytime if symptoms increase

## 2016-07-01 NOTE — Telephone Encounter (Signed)
Spoke w/ Pt's daughter, [name], Pt has not been running fevers but still having wheezing. Informed [name] of PCP recommendations, Pred[facility]one sent [facility] and to call in 4-5 days if not improving. [name] verbalized understanding.

## 2016-07-01 NOTE — Telephone Encounter (Signed)
Pt's daughter called in to make provider aware that pt has completed medication given at visit and still isn't feeling 100 percent. She says that pt is still coughing up phlem. She would like to be advised further.     CB: [phone]  [facility]acy: [facility] #[id number] - [place], [address].

## 2016-07-02 ENCOUNTER — Telehealth: Payer: Self-pay

## 2016-07-02 NOTE — Telephone Encounter (Signed)
Home Health Plan of Care received from [facility]. Plan of care reviewed, signed and faxed to [phone]. Forms sent for scanning.

## 2016-07-03 DIAGNOSIS — Z87891 Personal history of nicotine dependence: Secondary | ICD-10-CM | POA: Diagnosis not present

## 2016-07-03 DIAGNOSIS — W19XXXD Unspecified fall, subsequent encounter: Secondary | ICD-10-CM | POA: Diagnosis not present

## 2016-07-03 DIAGNOSIS — Z7982 Long term (current) use of aspirin: Secondary | ICD-10-CM | POA: Diagnosis not present

## 2016-07-03 DIAGNOSIS — M25511 Pain in right shoulder: Secondary | ICD-10-CM | POA: Diagnosis not present

## 2016-07-03 DIAGNOSIS — I480 Paroxysmal atrial fibrillation: Secondary | ICD-10-CM | POA: Diagnosis not present

## 2016-07-03 DIAGNOSIS — Z9181 History of falling: Secondary | ICD-10-CM | POA: Diagnosis not present

## 2016-07-03 DIAGNOSIS — J45909 Unspecified asthma, uncomplicated: Secondary | ICD-10-CM | POA: Diagnosis not present

## 2016-07-03 DIAGNOSIS — Z951 Presence of aortocoronary bypass graft: Secondary | ICD-10-CM | POA: Diagnosis not present

## 2016-07-03 DIAGNOSIS — I509 Heart failure, unspecified: Secondary | ICD-10-CM | POA: Diagnosis not present

## 2016-07-03 DIAGNOSIS — Z8673 Personal history of transient ischemic attack (TIA), and cerebral infarction without residual deficits: Secondary | ICD-10-CM | POA: Diagnosis not present

## 2016-07-03 DIAGNOSIS — E119 Type 2 diabetes mellitus without complications: Secondary | ICD-10-CM | POA: Diagnosis not present

## 2016-07-03 DIAGNOSIS — I110 Hypertensive heart disease with heart failure: Secondary | ICD-10-CM | POA: Diagnosis not present

## 2016-07-03 DIAGNOSIS — R413 Other amnesia: Secondary | ICD-10-CM | POA: Diagnosis not present

## 2016-07-03 DIAGNOSIS — Z952 Presence of prosthetic heart valve: Secondary | ICD-10-CM | POA: Diagnosis not present

## 2016-07-03 DIAGNOSIS — Z87440 Personal history of urinary (tract) infections: Secondary | ICD-10-CM | POA: Diagnosis not present

## 2016-07-03 DIAGNOSIS — R2689 Other abnormalities of gait and mobility: Secondary | ICD-10-CM | POA: Diagnosis not present

## 2016-07-03 DIAGNOSIS — M1990 Unspecified osteoarthritis, unspecified site: Secondary | ICD-10-CM | POA: Diagnosis not present

## 2016-07-03 DIAGNOSIS — I2510 Atherosclerotic heart disease of native coronary artery without angina pectoris: Secondary | ICD-10-CM | POA: Diagnosis not present

## 2016-07-04 DIAGNOSIS — L821 Other seborrheic keratosis: Secondary | ICD-10-CM | POA: Diagnosis not present

## 2016-07-10 DIAGNOSIS — I110 Hypertensive heart disease with heart failure: Secondary | ICD-10-CM | POA: Diagnosis not present

## 2016-07-10 DIAGNOSIS — I2510 Atherosclerotic heart disease of native coronary artery without angina pectoris: Secondary | ICD-10-CM | POA: Diagnosis not present

## 2016-07-10 DIAGNOSIS — Z7982 Long term (current) use of aspirin: Secondary | ICD-10-CM | POA: Diagnosis not present

## 2016-07-10 DIAGNOSIS — Z87891 Personal history of nicotine dependence: Secondary | ICD-10-CM | POA: Diagnosis not present

## 2016-07-10 DIAGNOSIS — I509 Heart failure, unspecified: Secondary | ICD-10-CM | POA: Diagnosis not present

## 2016-07-10 DIAGNOSIS — E119 Type 2 diabetes mellitus without complications: Secondary | ICD-10-CM | POA: Diagnosis not present

## 2016-07-10 DIAGNOSIS — Z87440 Personal history of urinary (tract) infections: Secondary | ICD-10-CM | POA: Diagnosis not present

## 2016-07-10 DIAGNOSIS — R2689 Other abnormalities of gait and mobility: Secondary | ICD-10-CM | POA: Diagnosis not present

## 2016-07-10 DIAGNOSIS — Z8673 Personal history of transient ischemic attack (TIA), and cerebral infarction without residual deficits: Secondary | ICD-10-CM | POA: Diagnosis not present

## 2016-07-10 DIAGNOSIS — J45909 Unspecified asthma, uncomplicated: Secondary | ICD-10-CM | POA: Diagnosis not present

## 2016-07-10 DIAGNOSIS — I480 Paroxysmal atrial fibrillation: Secondary | ICD-10-CM | POA: Diagnosis not present

## 2016-07-10 DIAGNOSIS — R413 Other amnesia: Secondary | ICD-10-CM | POA: Diagnosis not present

## 2016-07-10 DIAGNOSIS — Z9181 History of falling: Secondary | ICD-10-CM | POA: Diagnosis not present

## 2016-07-10 DIAGNOSIS — Z952 Presence of prosthetic heart valve: Secondary | ICD-10-CM | POA: Diagnosis not present

## 2016-07-10 DIAGNOSIS — M1990 Unspecified osteoarthritis, unspecified site: Secondary | ICD-10-CM | POA: Diagnosis not present

## 2016-07-10 DIAGNOSIS — M25511 Pain in right shoulder: Secondary | ICD-10-CM | POA: Diagnosis not present

## 2016-07-10 DIAGNOSIS — Z951 Presence of aortocoronary bypass graft: Secondary | ICD-10-CM | POA: Diagnosis not present

## 2016-07-10 DIAGNOSIS — W19XXXD Unspecified fall, subsequent encounter: Secondary | ICD-10-CM | POA: Diagnosis not present

## 2016-07-11 ENCOUNTER — Ambulatory Visit (INDEPENDENT_AMBULATORY_CARE_PROVIDER_SITE_OTHER): Payer: Medicare Other | Admitting: Cardiovascular Disease

## 2016-07-11 ENCOUNTER — Encounter: Payer: Self-pay | Admitting: Cardiovascular Disease

## 2016-07-11 VITALS — BP 122/62 | HR 45 | Ht 62.0 in | Wt 165.0 lb

## 2016-07-11 DIAGNOSIS — I4821 Permanent atrial fibrillation: Secondary | ICD-10-CM

## 2016-07-11 DIAGNOSIS — Z952 Presence of prosthetic heart valve: Secondary | ICD-10-CM

## 2016-07-11 DIAGNOSIS — I25810 Atherosclerosis of coronary artery bypass graft(s) without angina pectoris: Secondary | ICD-10-CM

## 2016-07-11 DIAGNOSIS — I10 Essential (primary) hypertension: Secondary | ICD-10-CM | POA: Diagnosis not present

## 2016-07-11 DIAGNOSIS — E118 Type 2 diabetes mellitus with unspecified complications: Secondary | ICD-10-CM | POA: Diagnosis not present

## 2016-07-11 DIAGNOSIS — I272 Pulmonary hypertension, unspecified: Secondary | ICD-10-CM

## 2016-07-11 DIAGNOSIS — E785 Hyperlipidemia, unspecified: Secondary | ICD-10-CM

## 2016-07-11 DIAGNOSIS — I482 Chronic atrial fibrillation: Secondary | ICD-10-CM

## 2016-07-11 MED ORDER — DILTIAZEM HCL ER 120 MG PO CP12: ORAL_CAPSULE | ORAL | 3 refills | Status: DC

## 2016-07-11 NOTE — Progress Notes (Signed)
Patient ID: Rebecca Skinner, female   DOB: 02/28/1923, 83 y.o.   MRN: [id number]      HPI: Rebecca Skinner is an 83-year-old female who  established cardiology care with me in February 2015. She is a former patient of Dr. Weintraub.  She presents for an 25 month follow-up cardiology evaluation.  Rebecca Skinner underwent a pericardial aortic valve replacement in 1998 and single vessel bypass with a vein graft to the right artery artery. She also has a history of sick sinus syndrome with paroxysmal atrial fibrillation and was taken off Coumadin anticoagulation in the past due to multiple falls in 2009 in 2010. There is no history of CVA or TIAs. She does have significant arthritic issues with back discomfort and also arthritis of her knees. She last saw Dr. Weintraub one year ago and at that time, she was in a normal sinus rhythm.  Earlier this year, Rebecca Skinner had noticed increasing palpitations as well as chest fluttering. She has significant shortness of breath almost all the time. She currently is living with her grandson. She does note some wheezing. Her last nuclear perfusion study was in 2011 which was normal. Her last echo Doppler study was in 2013 which showed moderate asymmetric LVH with septal wall at 1.6 cm and posterior wall 1.1 cm of the sigmoid septum. Ejection fraction was greater than 55%. She had mild to moderate mitral regurgitation, mild to moderate tricuspid regurgitation with elevation of RV systolic pressure at 46 mm. There bioprosthetic aortic valve had a peak gradient of 17 and mean gradient of 8 within normal limits for this valve. On 08/12/2013 she underwent a nuclear perfusion study which was interpreted as intermediate risk with it suggested a fixed lateral defect with minimal peri-infarction ischemia. A CardioNet monitor had shown atrial fibrillation with bursts of increased heart rate.  She walks with a walker. She has undergone left knee replacement. She has history of diabetes mellitus as  well as hypertension. She had been on dual antiplatelet therapy with aspirin and Plavix. She was hospitalized on on the neurology service from 3/20 - 242015 with CVA. Her symptoms have resolved. Carotid studies demonstrated mild internal carotid stenoses of less than 40%. During that evaluation, she apparently was taken off aspirin and Plavix by Dr. Sethi and was given a prescription for Eliquis  5 mg since was felt that her CVA was due to atrial fibrillation.  She presented to [facility] emergency room on 03/28/2014 with complaints of rectal bleeding for approximately a week.  She was found to have significant anemia with a hemoglobin of 7.4 and hematocrit of 22.2.  She was treated with 3 units of packed red blood cells during her admission.  Eliquis and aspirin were held, and she is not been on this since. And was restarted back on aspirin.  She underwent colonoscopy and was found to have an AVM in the cecum area, which was felt to be the cause of her lower extremity bleeding.  Since I last saw her, she eyes any episodes of chest pain.  She is unaware of fast heartbeats.  She has not had any episodes of presyncope or syncope.  She did fall on one occasion when she tripped.  In July 2016.  An echo Doppler study showed an EF of 60-65%.  There was grade 2 diastolic dysfunction.  Her bioprosthetic aortic valve was well-seated and functioning normally.  Mean gradient was 5.  There was moderate RA dilatation and severe TR with increased.    PA pressure at 63 mm.  She also has diabetes mellitus, as well as thyroid disease.  She presents for cardiologic evaluation.  Past Medical History:  Diagnosis Date  . Anxiety 11/20/2011  . Asthma    [facility] DR [facility]Z  . CAD (coronary artery disease)    s/p CABG s/p AO valve replacement ([place] [facility])  . CVA (cerebral infarction) 08-2013   multiple, L, d/t Afib, started eloquis  . Diabetes mellitus    type 2   . Dizziness    Chronic, admiet 07-2010,saw  neuro, thought to be a peripheral issue   . Dry skin   . GERD (gastroesophageal reflux disease)   . Hyperlipidemia   . Hypertension   . Hyperthyroidism   . Keloid    @ chest  . Memory loss   . Osteoarthritis   . Osteopenia    per dexa 12/09  . Paroxysmal atrial fibrillation (HCC)    cards d/c coumadin 12/2008 d/t persisten NSR and frequent falls- restarted coumadin july 2011, now on Eliquis  . Recurrent UTI   . Shortness of breath dyspnea   . TIA (transient ischemic attack)     Past Surgical History:  Procedure Laterality Date  . ABDOMINAL HYSTERECTOMY    . AORTIC VALVE REPLACEMENT  1998  . cagb  1998  . CAROTID DOPPLER  07/13/12   BILATERAL BULB/PROXIMAL ICAS;MILD AMOUNT OF FIBROUS PLAQUE WITH NO DIAMETER REDUCTION.  . CESAREAN SECTION     x 2  . COLONOSCOPY WITH PROPOFOL N/A 03/30/2014   Procedure: COLONOSCOPY WITH PROPOFOL;  Surgeon: John N Perry, MD;  Location: [facility];  Service: Endoscopy;  Laterality: N/A;  . CORONARY ARTERY BYPASS GRAFT  1998   SVG TO RCA  . HEMORRHOID SURGERY    . MYOCARDIAL PERFUSION STUDY  12/19/09   NORMAL PATTERN OF PERFUSION IN ALL REGIONS.EF 75%.  . OOPHORECTOMY    . TOTAL KNEE ARTHROPLASTY  1999  . TRANSTHORACIC ECHOCARDIOGRAM  09/11/11   LVEF >55%.STAGE 1 DIASTOLIC DYSFUNCTION,ELEVATED LV FILLING PRESSURE.BIOPROSTHETIC AORTIC VALVE-PEAK AND MEAN GRADIENTS OF 17 MMHG AND 8 MMHG.SIGMOID SEPTUM.MILD TO MOD MR.MILD TO MOD TR.RVSP 46 MMHG.    Allergies  Allergen Reactions  . Hydrocodone Itching and Nausea Only  . Tramadol Hcl Itching and Nausea Only    Current Outpatient Prescriptions  Medication Sig Dispense Refill  . acetaminophen (TYLENOL) 500 MG tablet Take 1,000 mg by mouth every 6 (six) hours as needed for moderate pain (pain).    . albuterol (PROAIR HFA) 108 (90 Base) MCG/ACT inhaler Inhale 2 puffs into the lungs every 6 (six) hours as needed for wheezing or shortness of breath. 18 g 6  . ALPRAZolam (XANAX) 0.5 MG tablet Take 1  tablet (0.5 mg total) by mouth at bedtime as needed for anxiety or sleep. 30 tablet 3  . artificial tears (LACRILUBE) OINT ophthalmic ointment Place into both eyes at bedtime as needed for dry eyes. 3.5 g 11  . aspirin 81 MG tablet Take 81 mg by mouth daily.    . atorvastatin (LIPITOR) 40 MG tablet Take 1 tablet (40 mg total) by mouth at bedtime. 90 tablet 1  . azithromycin (ZITHROMAX Z-PAK) 250 MG tablet 2 tabs a day the first day, then 1 tab a day x 4 days 6 tablet 0  . benazepril (LOTENSIN) 20 MG tablet Take 1 tablet (20 mg total) by mouth daily. 90 tablet 0  . budesonide (PULMICORT) 0.5 MG/2ML nebulizer solution USE 1 VIAL VIA NEBULIZER TWICE   A DAY 120 mL 5  . Calcium Carbonate (CALCIUM 500 PO) Take 500 mg by mouth at bedtime.     . cholecalciferol (VITAMIN D) 1000 UNITS tablet Take 1,000 Units by mouth at bedtime.     . dorzolamide-timolol (COSOPT) 22.3-6.8 MG/ML ophthalmic solution Place 1 drop into both eyes 2 (two) times daily.     . esomeprazole (NEXIUM) 40 MG capsule Take 1 capsule (40 mg total) by mouth daily before breakfast. 90 capsule 1  . furosemide (LASIX) 20 MG tablet Take 2 tablets (40 mg total) by mouth daily. 180 tablet 1  . levalbuterol (XOPENEX) 0.63 MG/3ML nebulizer solution Take 3 mLs (0.63 mg total) by nebulization 2 (two) times daily. 180 mL 5  . magnesium oxide (MAG-OX) 400 (241.3 Mg) MG tablet Take 1 tablet (400 mg total) by mouth daily. 90 tablet 2  . methimazole (TAPAZOLE) 5 MG tablet Take 1 tablet (5 mg total) by mouth 3 (three) times a week. 40 tablet 1  . metoprolol tartrate (LOPRESSOR) 25 MG tablet Take 1 tablet (25 mg total) by mouth 2 (two) times daily.    . nitroGLYCERIN (NITROSTAT) 0.4 MG SL tablet Place 0.4 mg under the tongue every 5 (five) minutes as needed for chest pain (chest pain). Reported on 11/10/2015    . PAZEO 0.7 % SOLN     . potassium chloride (KLOR-CON M10) 10 MEQ tablet Take 1 tablet (10 mEq total) by mouth daily. 90 tablet 2  . predniSONE  (DELTASONE) 10 MG tablet Take 1 tablet (10 mg total) by mouth 2 (two) times daily with a meal. 10 tablet 0  . RESTASIS MULTIDOSE 0.05 % ophthalmic emulsion     . TRAVATAN Z 0.004 % SOLN ophthalmic solution Place 1 drop into both eyes at bedtime.      No current facility-administered medications for this visit.     Socially she is widowed. She lives with her grandson. She has 2 deceased children, 4 children alive as well as 4 grandchildren 3 great-grandchildren. There is no tobacco use.  Family History  Problem Relation Age of Onset  . Heart attack Father   . Coronary artery disease Son 44    deceased  . Hypertension Brother   . Stroke Brother   . Colon cancer Neg Hx   . Breast cancer Neg Hx   . Thyroid disease Neg Hx    FH is noteworthy in that her father suffered an MI at age 70. Her mother died at childbirth.  ROS General: Negative; No fevers, chills, or night sweats;  HEENT: Negative; No changes in vision or hearing, sinus congestion, difficulty swallowing Pulmonary: Negative; No cough, wheezing, shortness of breath, hemoptysis Cardiovascular: No chest pain, presyncope, syncope, palpitations GI: Recent GI blood loss requiring 3 unit packed red blood cell transfusion GU: Negative; No dysuria, hematuria, or difficulty voiding Musculoskeletal: Negative; no myalgias, joint pain, or weakness Hematologic/Oncology: Negative; no easy bruising, bleeding Endocrine: Negative; no heat/cold intolerance; no diabetes Neuro: Positive CVA March 2015 resolution of symptoms Skin: Negative; No rashes or skin lesions Psychiatric: Negative; No behavioral problems, depression Sleep: Negative; No snoring, daytime sleepiness, hypersomnolence, bruxism, restless legs, hypnogognic hallucinations, no cataplexy Other comprehensive 14 point system review is negative.  PE BP 122/62   Pulse (!) 45   Ht 5' 2" (1.575 m)   Wt 165 lb (74.8 kg)   BMI 30.18 kg/m    Wt Readings from Last 3 Encounters:    07/11/16 165 lb (74.8 kg)  06/21/16 171 lb   8 oz (77.8 kg)  05/29/16 170 lb 8 oz (77.3 kg)   General: Alert, oriented, no distress.  Skin: normal turgor, no rashes HEENT: Normocephalic, atraumatic. There is significant arcus senilis.  Pupils round and reactive; sclera anicteric; extraocular muscles intact; Fundi arteriolar narrowing. Nose without nasal septal hypertrophy Mouth/Parynx benign; Mallinpatti scale 2 Neck: No JVD, possible soft carotid bruits versus transmitted murmur; normal carotid upstroke Lungs: clear to ausculatation and percussion; no wheezing or rales Chest wall: without tenderness to palpitation  Heart: Bradycardic and mildly irregular with a heart rate in the 40s.., s1 s2 normal; 2/6 systolic murmur at the right and left sternal border. No diastolic murmur. No rubs thrills or heaves Abdomen: soft, nontender; no hepatosplenomehaly, BS+; abdominal aorta nontender and not dilated by palpation. Back: no CVA tenderness Pulses 2+ Extremities: no clubbing cyanosis or edema, Homan's sign negative  Neurologic: grossly nonfocal; Cranial nerves grossly wnl Psychologic: Normal mood and affect  ECG (independently read by me): Slow atrial fibrillation/junctional rhythm with heart rate 45 bpm.  Nonspecific ST changes.  QTc interval 427 ms.  December 2015 ECG (Independently read by me): Atrial fibrillation at 66 bpm.  Nonspecific ST changes.  April 2015 ECG (Independently read by me): Atrial fibrillation at 76 beats per minute. QTc interval 445  Prior ECG (independently read by me): Atrial flutter with variable block at approximately 65 beats per minute  LABS: BMP Latest Ref Rng & Units 05/29/2016 03/06/2016 03/05/2016  Glucose 70 - 99 mg/dL 127(H) 109(H) 109(H)  BUN 6 - 23 mg/dL _0 Creatinine 0.40 - 1.20 mg/dL 1.12 0.98 1.50(H)  Sodium 135 - 145 mEq/L 143 140 140  Potassium 3.5 - 5.1 mEq/L 4.9 3.8 4.4  Chloride 96 - 112 mEq/L 105 105 102  CO2 19 - 32 mEq/L 28 27 -   Calcium 8.4 - 10.5 mg/dL 9.6 9.0 -   Hepatic Function Latest Ref Rng & Units 03/05/2016 07/26/2015 12/20/2014  Total Protein 6.5 - 8.1 g/dL 6.4(L) 7.2 7.2  Albumin 3.5 - 5.0 g/dL 3.6 4.0 3.6  AST 15 - 41 U/L _1 ALT 14 - 54 U/L 15 17 13(L)  Alk Phosphatase 38 - 126 U/L 83 91 95  Total Bilirubin 0.3 - 1.2 mg/dL 0.6 0.4 0.5  Bilirubin, Direct 0.0 - 0.3 mg/dL - - -   CBC Latest Ref Rng & Units 05/29/2016 05/20/2016 03/06/2016  WBC 4.0 - 10.5 K/uL 6.9 11.0(A) 7.7  Hemoglobin 12.0 - 15.0 g/dL 13.1 16.3(A) 12.1  Hematocrit 36.0 - 46.0 % 40.0 46.9 37.7  Platelets 150.0 - 400.0 K/uL 160.0 - 178   Lab Results  Component Value Date   MCV 99.2 05/29/2016   MCV 97.1 (A) 05/20/2016   MCV 99.2 03/06/2016   Lab Results  Component Value Date   TSH 1.224 03/05/2016   Lab Results  Component Value Date   HGBA1C 6.7 (H) 03/05/2016   Lipid Panel     Component Value Date/Time   CHOL 117 05/29/2016 1204   TRIG 83.0 05/29/2016 1204   HDL 38.50 (L) 05/29/2016 1204   CHOLHDL 3 05/29/2016 1204   VLDL 16.6 05/29/2016 1204   LDLCALC 62 05/29/2016 1204    RADIOLOGY: No results found.  IMPRESSION:  1. Essential hypertension   2. Coronary artery disease involving coronary bypass graft of native heart without angina pectoris   3. Diabetes mellitus with complication ([place])   4. Dyslipidemia   5. S/P AVR (aortic valve replacement)   6. Permanent  atrial fibrillation (HCC)   7. Pulmonary hypertension    ASSESSMENT AND PLAN: [name] is an 83-year-old African American female who is almost 20 years status post aortic valve replacement with a pericardial tissue valve at which time she underwent single-vessel bypass with a saphenous vein graft placed to her RCA in 1998. A  nuclear perfusion study showed a lateral defect with minimal peri-infarction ischemia was not felt to be high risk. She has a history of atrial fibrillation and in the past was felt to be a fall risk for Coumadin.  March  2015 she was found to have probable multiple embolic events leading to her neurologic symptoms.  Initially was on eloquence anticoagulation, but due to GI bleeding.  Ultimately, anticoagulation was discontinued and she has been maintained on aspirin.  Her ECG today shows slow AF at 45 beats per minute and suggests a junctional rhythm.  I have suggested she wean and discontinue diltiazem from her present dose of 120 mg twice a day.  She will decrease this to 120 mg in the a.m. for 4 days and ultimately discontinue this.  Her blood pressure is stable and she also is taking furosemide 40 mg daily, benazapril 20 mg daily and metoprolol 25 mg twice a day.  She continues to be on atorvastatin 40 mg for hyperlipidemia.  She was recently put on prednisone short-term after your eye symptomatology.  And has been on a Z-Pak.  I reviewed her echo Doppler study from 2016.  I'm scheduling her for 2 year follow-up echo Doppler study to reassess her bioprosthetic valve, LV function and pulmonary hypertension.  I will see her in 6 -8 weeks for reevaluation.  Time spent: 25 minutes   A. , MD, FACC 07/13/2016 12:54 PM 

## 2016-07-11 NOTE — Patient Instructions (Signed)
Your physician has requested that you have an echocardiogram. Echocardiography is a painless test that uses sound waves to create images of your heart. It provides your doctor with information about the size and shape of your heart and how well your heart's chambers and valves are working. This procedure takes approximately one hour. There are no restrictions for this procedure. This will be done [facility].  Your physician has recommended you make the following change in your medication:   1.) decrease the diltiazem to 1 capsule in the morning for 4 days. If heart rate is not elevated then STOP this medication completely.   Your physician recommends that you schedule a follow-up appointment in: 6 weeks.

## 2016-07-15 ENCOUNTER — Ambulatory Visit (INDEPENDENT_AMBULATORY_CARE_PROVIDER_SITE_OTHER): Payer: Medicare Other | Admitting: Family Medicine

## 2016-07-15 VITALS — BP 111/43 | HR 47 | Temp 97.4°F | Ht 62.0 in | Wt 165.4 lb

## 2016-07-15 DIAGNOSIS — H1032 Unspecified acute conjunctivitis, left eye: Secondary | ICD-10-CM

## 2016-07-15 MED ORDER — POLYMYXIN B-TRIMETHOPRIM 10000-0.1 UNIT/ML-% OP SOLN: 1.0000 [drp] | OPHTHALMIC | 0 refills | Status: AC

## 2016-07-15 NOTE — Patient Instructions (Addendum)
Artificial tears will be helpful. Generally, people use 4 times per day as needed, but there is no limit.   If we start having headache, nausea, vomiting or fevers, let us know or seek care.

## 2016-07-15 NOTE — Progress Notes (Signed)
Chief Complaint  Patient presents with  . Eye Problem    Redness and pain left eye    Rebecca Skinner is here for left eye irritation. She is here with her daughter who helps with the hx.  Duration: 1 day Chemical exposure? No  Recent URI? No  Contact lenses? No  History of allergies? No  Treatment to date: None She also feels sensitive to the light in addition to describing a burning pain. No hx of glaucoma or ocular migraine. This has happened before and she was placed on eye drops that did resolve the issue. No nausea or vomiting, no headaches.  ROS:  Eyes: As noted above  Past Medical History:  Diagnosis Date  . Anxiety 11/20/2011  . Asthma    UNDER [facility] DR PAZ  . CAD (coronary artery disease)    s/p CABG s/p AO valve replacement ([place] [facility])  . CVA (cerebral infarction) 08-2013   multiple, L, d/t Afib, started eloquis  . Diabetes mellitus    type 2   . Dizziness    Chronic, admiet 07-2010,saw neuro, thought to be a peripheral issue   . Dry skin   . GERD (gastroesophageal reflux disease)   . Hyperlipidemia   . Hypertension   . Hyperthyroidism   . Keloid    @ chest  . Memory loss   . Osteoarthritis   . Osteopenia    per dexa 12/09  . Paroxysmal atrial fibrillation ([place])    cards d/c coumadin 12/2008 d/t persisten NSR and frequent falls- restarted coumadin july 2011, now on Eliquis  . Recurrent UTI   . Shortness of breath dyspnea   . TIA (transient ischemic attack)    Family History  Problem Relation Age of Onset  . Heart attack Father   . Coronary artery disease Son 93    deceased  . Hypertension Brother   . Stroke Brother   . Colon cancer Neg Hx   . Breast cancer Neg Hx   . Thyroid disease Neg Hx     BP (!) 111/43   Pulse (!) 47   Temp 97.4 F (36.3 C) (Oral)   Ht 5\' 2"  (1.575 m)   Wt 165 lb 6.4 oz (75 kg)   SpO2 100%   BMI 30.25 kg/m  Gen: Awake, alert, appears stated age Eyes: Very difficult to get an adequate exam 2/2 to inability  to open eye; no pain to light pressure over closed eyes b/l; the sclera is injected on the L, unremarkable on the R; unable to assess EOM or reactivity to light. Thin grey rim surrounding peripheral portion of iris b/l. Nose: Nares patent without discharge Mouth: MMM, pharynx without erythema or exudate Psych: Age appropriate judgment and insight; mood and affect normal  Acute conjunctivitis of left eye, unspecified acute conjunctivitis type - Plan: trimethoprim-polymyxin b (POLYTRIM) ophthalmic solution  Orders as above. Somewhat reassured that this has happened in the psat and resolved with abx drops. After exam, low concern for acute glaucoma or other sinister process. Instructed to practice good hand hygiene and try not to touch face. Warm compresses and artificial tears also recommended. F/u if no improvement in 7-10 days. Pt and daughter voiced understanding and agreement to the plan.  [place], DO 07/15/16 10:33 AM

## 2016-07-15 NOTE — Progress Notes (Signed)
Pre visit review using our clinic tool,if applicable. No additional management support is needed unless otherwise documented below in the visit note.  

## 2016-07-18 ENCOUNTER — Other Ambulatory Visit: Payer: Self-pay | Admitting: Internal Medicine

## 2016-07-18 DIAGNOSIS — J4550 Severe persistent asthma, uncomplicated: Secondary | ICD-10-CM | POA: Diagnosis not present

## 2016-08-06 ENCOUNTER — Other Ambulatory Visit: Payer: Self-pay

## 2016-08-06 ENCOUNTER — Ambulatory Visit (HOSPITAL_COMMUNITY): Payer: Medicare Other | Attending: Cardiovascular Disease

## 2016-08-06 DIAGNOSIS — Z951 Presence of aortocoronary bypass graft: Secondary | ICD-10-CM | POA: Insufficient documentation

## 2016-08-06 DIAGNOSIS — Z952 Presence of prosthetic heart valve: Secondary | ICD-10-CM

## 2016-08-06 DIAGNOSIS — Z8673 Personal history of transient ischemic attack (TIA), and cerebral infarction without residual deficits: Secondary | ICD-10-CM | POA: Diagnosis not present

## 2016-08-06 DIAGNOSIS — I081 Rheumatic disorders of both mitral and tricuspid valves: Secondary | ICD-10-CM | POA: Insufficient documentation

## 2016-08-06 DIAGNOSIS — E119 Type 2 diabetes mellitus without complications: Secondary | ICD-10-CM | POA: Insufficient documentation

## 2016-08-06 DIAGNOSIS — I2510 Atherosclerotic heart disease of native coronary artery without angina pectoris: Secondary | ICD-10-CM | POA: Diagnosis not present

## 2016-08-06 DIAGNOSIS — E785 Hyperlipidemia, unspecified: Secondary | ICD-10-CM | POA: Insufficient documentation

## 2016-08-06 DIAGNOSIS — I371 Nonrheumatic pulmonary valve insufficiency: Secondary | ICD-10-CM | POA: Diagnosis not present

## 2016-08-06 DIAGNOSIS — I25810 Atherosclerosis of coronary artery bypass graft(s) without angina pectoris: Secondary | ICD-10-CM | POA: Diagnosis not present

## 2016-08-06 DIAGNOSIS — I10 Essential (primary) hypertension: Secondary | ICD-10-CM | POA: Insufficient documentation

## 2016-08-06 DIAGNOSIS — I4891 Unspecified atrial fibrillation: Secondary | ICD-10-CM | POA: Diagnosis not present

## 2016-08-14 ENCOUNTER — Other Ambulatory Visit: Payer: Self-pay | Admitting: Internal Medicine

## 2016-08-15 ENCOUNTER — Other Ambulatory Visit: Payer: Self-pay | Admitting: Cardiovascular Disease

## 2016-08-15 DIAGNOSIS — J4550 Severe persistent asthma, uncomplicated: Secondary | ICD-10-CM | POA: Diagnosis not present

## 2016-08-15 NOTE — Telephone Encounter (Signed)
Rx(s) sent to pharmacy electronically.  

## 2016-08-19 ENCOUNTER — Telehealth: Payer: Self-pay | Admitting: Cardiovascular Disease

## 2016-08-19 NOTE — Telephone Encounter (Signed)
ok 

## 2016-08-19 NOTE — Telephone Encounter (Signed)
Spoke with pt sister, she reports dr Claiborne Billings told them if the patient started breathing hard again they could restart the diltiazem. The patient had problems the other day so they have restart the diltiazem 120 mg once daily. Aware will get okay to refill from dr Claiborne Billings, they have a follow up appointment Monday.

## 2016-08-19 NOTE — Telephone Encounter (Signed)
°*  STAT* If patient is at the pharmacy, call can be transferred to refill team.   1. Which medications need to be refilled? (please list name of each medication and dose if known) diltiazem  2. Which pharmacy/location (including street and city if local pharmacy) is medication to be sent to? [facility] on [address]. Do they need a 30 day or 90 day supply? [place]

## 2016-08-19 NOTE — Telephone Encounter (Signed)
New Message    Dr Claiborne Billings took her sister off of this medication diltiazem and she is having trouble breathing again , they need a new prescription for this medication called back in

## 2016-08-19 NOTE — Telephone Encounter (Signed)
REVIEWED PATIENT LAST OFFICE VISIT 07/11/16 WITH DR Claiborne Billings   MEDICATION IN QUESTION WAS  WEAN/DISCONTINUE AT LAST VISIT   SPOKE TO DAUGHTER Dorthie [place] . SHE STATES HER SISTER MUST HAVE CALLED EARLIER  VERBALIZED UNDERSTANDING.

## 2016-08-20 MED ORDER — DILTIAZEM HCL ER COATED BEADS 120 MG PO CP24: 120.0000 mg | ORAL_CAPSULE | Freq: Every day | ORAL | 3 refills | Status: DC

## 2016-08-20 NOTE — Telephone Encounter (Signed)
Left message for daughter, refill sent to the pharmacy.

## 2016-08-21 ENCOUNTER — Ambulatory Visit: Payer: Medicare Other | Admitting: Cardiovascular Disease

## 2016-08-22 ENCOUNTER — Telehealth: Payer: Self-pay | Admitting: *Deleted

## 2016-08-22 NOTE — Telephone Encounter (Signed)
Patient's designated family member notified of echo results. Keep 3/19/appointment.

## 2016-08-22 NOTE — Telephone Encounter (Signed)
-----   Message from Troy Sine, MD sent at 08/08/2016  5:28 PM EST ----- Nl EF; bioprosthetic AVR functioning well.

## 2016-08-26 ENCOUNTER — Ambulatory Visit (INDEPENDENT_AMBULATORY_CARE_PROVIDER_SITE_OTHER): Payer: Medicare Other | Admitting: Cardiovascular Disease

## 2016-08-26 ENCOUNTER — Encounter: Payer: Self-pay | Admitting: Cardiovascular Disease

## 2016-08-26 VITALS — BP 165/76 | HR 82 | Ht 62.0 in | Wt 169.0 lb

## 2016-08-26 DIAGNOSIS — I10 Essential (primary) hypertension: Secondary | ICD-10-CM

## 2016-08-26 DIAGNOSIS — I4821 Permanent atrial fibrillation: Secondary | ICD-10-CM

## 2016-08-26 DIAGNOSIS — I25810 Atherosclerosis of coronary artery bypass graft(s) without angina pectoris: Secondary | ICD-10-CM | POA: Diagnosis not present

## 2016-08-26 DIAGNOSIS — Z952 Presence of prosthetic heart valve: Secondary | ICD-10-CM

## 2016-08-26 DIAGNOSIS — I482 Chronic atrial fibrillation: Secondary | ICD-10-CM

## 2016-08-26 NOTE — Patient Instructions (Signed)
Your physician wants you to follow-up in: 6 MONTHS OR SOONER IF NEEDED. You will receive a reminder letter in the mail two months in advance. If you don't receive a letter, please call our office to schedule the follow-up appointment.   If you need a refill on your cardiac medications before your next appointment, please call your pharmacy.   

## 2016-08-26 NOTE — Progress Notes (Signed)
Patient ID: ELISABELLA HACKER, female   DOB: 1923/04/17, 83 y.o.   MRN: [id number]      HPI: Ms. Juliet Vasbinder is an 83 year old female who  established cardiology care with me in February 2015. She is a former patient of Dr. Rollene Fare.  She presents for an 7 week follow-up cardiology evaluation.  Ms. Frickey underwent a pericardial aortic valve replacement in 1998 and single vessel bypass with a vein graft to the right artery artery. She also has a history of sick sinus syndrome with paroxysmal atrial fibrillation and was taken off Coumadin anticoagulation in the past due to multiple falls in 2009 in 2010. There is no history of CVA or TIAs. She does have significant arthritic issues with back discomfort and also arthritis of her knees. She last saw Dr. Rollene Fare one year ago and at that time, she was in a normal sinus rhythm.  In 2015  Ms. Eichler had noticed increasing palpitations as well as chest fluttering. She has significant shortness of breath almost all the time. She currently is living with her grandson. She does note some wheezing. Her last nuclear perfusion study was in 2011 which was normal. Her last echo Doppler study was in 2013 which showed moderate asymmetric LVH with septal wall at 1.6 cm and posterior wall 1.1 cm of the sigmoid septum. Ejection fraction was greater than 55%. She had mild to moderate mitral regurgitation, mild to moderate tricuspid regurgitation with elevation of RV systolic pressure at 46 mm. There bioprosthetic aortic valve had a peak gradient of 17 and mean gradient of 8 within normal limits for this valve. On 08/12/2013 she underwent a nuclear perfusion study which was interpreted as intermediate risk with it suggested a fixed lateral defect with minimal peri-infarction ischemia. A CardioNet monitor had shown atrial fibrillation with bursts of increased heart rate.  She walks with a walker. She has undergone left knee replacement. She has history of diabetes mellitus as well as  hypertension. She had been on dual antiplatelet therapy with aspirin and Plavix. She was hospitalized on on the neurology service from 3/20 - 24, 2015 with CVA. Her symptoms have resolved. Carotid studies demonstrated mild internal carotid stenoses of less than 40%. During that evaluation, she apparently was taken off aspirin and Plavix by Dr. [name] and was given a prescription for Eliquis  5 mg since was felt that her CVA was due to atrial fibrillation.  She presented to [facility] emergency room on 03/28/2014 with complaints of rectal bleeding for approximately a week.  She was found to have significant anemia with a hemoglobin of 7.4 and hematocrit of 22.2.  She was treated with 3 units of packed red blood cells during her admission.  Eliquis and aspirin were held, and she is not been on this since. And was restarted back on aspirin.  She underwent colonoscopy and was found to have an AVM in the cecum area, which was felt to be the cause of her lower extremity bleeding.  n July 2016 a follow-up echo Doppler study showed an EF of 60-65%.  There was grade 2 diastolic dysfunction.  Her bioprosthetic aortic valve was well-seated and functioning normally.  Mean gradient was 5.  There was moderate RA dilatation and severe TR with increased.  PA pressure at 63 mm.  When I last saw her on July 11, 2016, she had  very slow atrial fibrillation at 45 beats and suggested a junctional rhythm.  At that time, she had been on diltiazem 120  mg twice a day, and this was weaned and ultimately discontinued.  Subsequently , she  resumed taking just 120 mg daily.  She is unaware of presyncope or syncope.  She is unaware of any fast heartbeats and feels better on this reduced dose.  She denies bleeding.  A repeat echo Doppler study on 08/06/2016 showed an EF of 65-70%.  His aortic bioprosthesis was functioning well with a mean gradient of 6.  Her left atrium was moderately dilated.  There was mild MR, but she had evidence for  severe tricuspid regurgitation with moderate pulmonary hypertension with estimated systolic pressure at 51 mm.  She also has diabetes mellitus, as well as thyroid disease.  She presents for evaluation.  Past Medical History:  Diagnosis Date  . Anxiety 11/20/2011  . Asthma    UNDER [facility]ARE [facility] DR PAZ  . CAD (coronary artery disease)    s/p CABG s/p AO valve replacement ([place] [facility])  . CVA (cerebral infarction) 08-2013   multiple, L, d/t Afib, started eloquis  . Diabetes mellitus    type 2   . Dizziness    Chronic, admiet 07-2010,saw neuro, thought to be a peripheral issue   . Dry skin   . GERD (gastroesophageal reflux disease)   . Hyperlipidemia   . Hypertension   . Hyperthyroidism   . Keloid    @ chest  . Memory loss   . Osteoarthritis   . Osteopenia    per dexa 12/09  . Paroxysmal atrial fibrillation ([place])    cards d/c coumadin 12/2008 d/t persisten NSR and frequent falls- restarted coumadin july 2011, now on Eliquis  . Recurrent UTI   . Shortness of breath dyspnea   . TIA (transient ischemic attack)     Past Surgical History:  Procedure Laterality Date  . ABDOMINAL HYSTERECTOMY    . AORTIC VALVE REPLACEMENT  1998  . cagb  1998  . CAROTID DOPPLER  07/13/12   BILATERAL BULB/PROXIMAL ICAS;MILD AMOUNT OF FIBROUS PLAQUE WITH NO DIAMETER REDUCTION.  . CESAREAN SECTION     x 2  . COLONOSCOPY WITH PROPOFOL N/A 03/30/2014   Procedure: COLONOSCOPY WITH PROPOFOL;  Surgeon: Irene Shipper, MD;  Location: [facility] ENDOSCOPY;  Service: Endoscopy;  Laterality: N/A;  . CORONARY ARTERY BYPASS GRAFT  1998   SVG TO RCA  . HEMORRHOID SURGERY    . MYOCARDIAL PERFUSION STUDY  12/19/09   NORMAL PATTERN OF PERFUSION IN ALL REGIONS.EF 75%.  . OOPHORECTOMY    . TOTAL KNEE ARTHROPLASTY  1999  . TRANSTHORACIC ECHOCARDIOGRAM  09/11/11   LVEF >55%.STAGE 1 DIASTOLIC DYSFUNCTION,ELEVATED LV FILLING PRESSURE.BIOPROSTHETIC AORTIC VALVE-PEAK AND MEAN GRADIENTS OF 17 MMHG AND 8 MMHG.SIGMOID SEPTUM.MILD TO  MOD MR.MILD TO MOD TR.RVSP 46 MMHG.    Allergies  Allergen Reactions  . Hydrocodone Itching and Nausea Only  . Tramadol Hcl Itching and Nausea Only    Current Outpatient Prescriptions  Medication Sig Dispense Refill  . acetaminophen (TYLENOL) 500 MG tablet Take 1,000 mg by mouth every 6 (six) hours as needed for moderate pain (pain).    Marland Kitchen ALPRAZolam (XANAX) 0.5 MG tablet Take 1 tablet (0.5 mg total) by mouth at bedtime as needed for anxiety or sleep. 30 tablet 3  . artificial tears (LACRILUBE) OINT ophthalmic ointment Place into both eyes at bedtime as needed for dry eyes. 3.5 g 11  . aspirin 81 MG tablet Take 81 mg by mouth daily.    Marland Kitchen atorvastatin (LIPITOR) 40 MG tablet Take 1 tablet (  40 mg total) by mouth at bedtime. 90 tablet 2  . azithromycin (ZITHROMAX Z-PAK) 250 MG tablet 2 tabs a day the first day, then 1 tab a day x 4 days 6 tablet 0  . benazepril (LOTENSIN) 20 MG tablet TAKE 1 TABLET BY MOUTH DAILY 90 tablet 3  . budesonide (PULMICORT) 0.5 MG/2ML nebulizer solution USE 1 VIAL VIA NEBULIZER TWICE A DAY 120 mL 5  . Calcium Carbonate (CALCIUM 500 PO) Take 500 mg by mouth at bedtime.     . cholecalciferol (VITAMIN D) 1000 UNITS tablet Take 1,000 Units by mouth at bedtime.     Marland Kitchen diltiazem (CARDIZEM CD) 120 MG 24 hr capsule Take 1 capsule (120 mg total) by mouth daily. 90 capsule 3  . dorzolamide-timolol (COSOPT) 22.3-6.8 MG/ML ophthalmic solution Place 1 drop into both eyes 2 (two) times daily.     Marland Kitchen esomeprazole (NEXIUM) 40 MG capsule Take 1 capsule (40 mg total) by mouth daily before breakfast. 90 capsule 1  . furosemide (LASIX) 20 MG tablet Take 2 tablets (40 mg total) by mouth daily. 180 tablet 1  . levalbuterol (XOPENEX) 0.63 MG/3ML nebulizer solution Take 3 mLs (0.63 mg total) by nebulization 2 (two) times daily. 180 mL 5  . magnesium oxide (MAG-OX) 400 (241.3 Mg) MG tablet Take 1 tablet (400 mg total) by mouth daily. 90 tablet 2  . methimazole (TAPAZOLE) 5 MG tablet Take 1  tablet (5 mg total) by mouth 3 (three) times a week. 40 tablet 1  . metoprolol tartrate (LOPRESSOR) 25 MG tablet Take 1 tablet (25 mg total) by mouth 2 (two) times daily.    . nitroGLYCERIN (NITROSTAT) 0.4 MG SL tablet Place 0.4 mg under the tongue every 5 (five) minutes as needed for chest pain (chest pain). Reported on 11/10/2015    . PAZEO 0.7 % SOLN     . potassium chloride (KLOR-CON M10) 10 MEQ tablet Take 1 tablet (10 mEq total) by mouth daily. 90 tablet 2  . predniSONE (DELTASONE) 10 MG tablet Take 1 tablet (10 mg total) by mouth 2 (two) times daily with a meal. 10 tablet 0  . RESTASIS MULTIDOSE 0.05 % ophthalmic emulsion     . TRAVATAN Z 0.004 % SOLN ophthalmic solution Place 1 drop into both eyes at bedtime.      No current facility-administered medications for this visit.     Socially she is widowed. She lives with her grandson. She has 2 deceased children, 4 children alive as well as 4 grandchildren 3 great-grandchildren. There is no tobacco use.  Family History  Problem Relation Age of Onset  . Heart attack Father   . Coronary artery disease Son 38    deceased  . Hypertension Brother   . Stroke Brother   . Colon cancer Neg Hx   . Breast cancer Neg Hx   . Thyroid disease Neg Hx    FH is noteworthy in that her father suffered an MI at age 57. Her mother died at childbirth.  ROS General: Negative; No fevers, chills, or night sweats;  HEENT: Negative; No changes in vision or hearing, sinus congestion, difficulty swallowing Pulmonary: Negative; No cough, wheezing, shortness of breath, hemoptysis Cardiovascular: No chest pain, presyncope, syncope, palpitations GI: Recent GI blood loss requiring 3 unit packed red blood cell transfusion GU: Negative; No dysuria, hematuria, or difficulty voiding Musculoskeletal: Negative; no myalgias, joint pain, or weakness Hematologic/Oncology: Negative; no easy bruising, bleeding Endocrine: Negative; no heat/cold intolerance; no  diabetes Neuro: Positive CVA  March 2015 resolution of symptoms Skin: Negative; No rashes or skin lesions Psychiatric: Negative; No behavioral problems, depression Sleep: Negative; No snoring, daytime sleepiness, hypersomnolence, bruxism, restless legs, hypnogognic hallucinations, no cataplexy Other comprehensive 14 point system review is negative.  PE BP (!) 165/76   Pulse 82   Ht '5\' 2"'  (1.575 m)   Wt 169 lb (76.7 kg)   SpO2 98%   BMI 30.91 kg/m    Repeat blood pressure by me was 118/76.  Pulse was in the 80s.  Wt Readings from Last 3 Encounters:  08/26/16 169 lb (76.7 kg)  07/15/16 165 lb 6.4 oz (75 kg)  07/11/16 165 lb (74.8 kg)   General: Alert, oriented, no distress.  Skin: normal turgor, no rashes HEENT: Normocephalic, atraumatic. There is significant arcus senilis.  Pupils round and reactive; sclera anicteric; extraocular muscles intact; Fundi arteriolar narrowing. Nose without nasal septal hypertrophy Mouth/Parynx benign; Mallinpatti scale 2 Neck: No JVD, possible soft carotid bruits versus transmitted murmur; normal carotid upstroke Lungs: clear to ausculatation and percussion; no wheezing or rales Chest wall: without tenderness to palpitation  Heart: irregularly irregular rhythm with a ventricular rate in the 70s and 80s.  with a heart rate in the 40s.., s1 s2 normal; 2/6 systolic murmur at the right and left sternal border. No diastolic murmur. No rubs thrills or heaves Abdomen: soft, nontender; no hepatosplenomehaly, BS+; abdominal aorta nontender and not dilated by palpation. Back: no CVA tenderness Pulses 2+ Extremities: no clubbing cyanosis or edema, Homan's sign negative  Neurologic: grossly nonfocal; Cranial nerves grossly wnl Psychologic: Normal mood and affect  ECG (independently read by me): Atrial fibrillation with rate 70-80.  Isolated PVC.  QTc interval 490 ms.  Lateral anterolateral T-wave changes.  July 11, 2016 ECG (independently read by me): Slow  atrial fibrillation/junctional rhythm with heart rate 45 bpm.  Nonspecific ST changes.  QTc interval 427 ms.  December 2015 ECG (Independently read by me): Atrial fibrillation at 66 bpm.  Nonspecific ST changes.  April 2015 ECG (Independently read by me): Atrial fibrillation at 76 beats per minute. QTc interval 445  Prior ECG (independently read by me): Atrial flutter with variable block at approximately 65 beats per minute  LABS: BMP Latest Ref Rng & Units 05/29/2016 03/06/2016 03/05/2016  Glucose 70 - 99 mg/dL 127(H) 109(H) 109(H)  BUN 6 - 23 mg/dL '19 13 19  ' Creatinine 0.40 - 1.20 mg/dL 1.12 0.98 1.50(H)  Sodium 135 - 145 mEq/L 143 140 140  Potassium 3.5 - 5.1 mEq/L 4.9 3.8 4.4  Chloride 96 - 112 mEq/L 105 105 102  CO2 19 - 32 mEq/L 28 27 -  Calcium 8.4 - 10.5 mg/dL 9.6 9.0 -   Hepatic Function Latest Ref Rng & Units 03/05/2016 07/26/2015 12/20/2014  Total Protein 6.5 - 8.1 g/dL 6.4(L) 7.2 7.2  Albumin 3.5 - 5.0 g/dL 3.6 4.0 3.6  AST 15 - 41 U/L '19 21 20  ' ALT 14 - 54 U/L 15 17 13(L)  Alk Phosphatase 38 - 126 U/L 83 91 95  Total Bilirubin 0.3 - 1.2 mg/dL 0.6 0.4 0.5  Bilirubin, Direct 0.0 - 0.3 mg/dL - - -   CBC Latest Ref Rng & Units 05/29/2016 05/20/2016 03/06/2016  WBC 4.0 - 10.5 K/uL 6.9 11.0(A) 7.7  Hemoglobin 12.0 - 15.0 g/dL 13.1 16.3(A) 12.1  Hematocrit 36.0 - 46.0 % 40.0 46.9 37.7  Platelets 150.0 - 400.0 K/uL 160.0 - 178   Lab Results  Component Value Date   MCV 99.2 05/29/2016  MCV 97.1 (A) 05/20/2016   MCV 99.2 03/06/2016   Lab Results  Component Value Date   TSH 1.224 03/05/2016   Lab Results  Component Value Date   HGBA1C 6.7 (H) 03/05/2016   Lipid Panel     Component Value Date/Time   CHOL 117 05/29/2016 1204   TRIG 83.0 05/29/2016 1204   HDL 38.50 (L) 05/29/2016 1204   CHOLHDL 3 05/29/2016 1204   VLDL 16.6 05/29/2016 1204   LDLCALC 62 05/29/2016 1204    RADIOLOGY: No results found.  IMPRESSION:  1. Coronary artery disease involving coronary  bypass graft of native heart without angina pectoris   2. Essential hypertension   3. Permanent atrial fibrillation ([place])   4. S/P AVR (aortic valve replacement)      ASSESSMENT AND PLAN: Ms. Sheilah Rayos is an 83 year old African American female who is  20 years status post aortic valve replacement with a pericardial tissue valve at which time she underwent single-vessel bypass with a saphenous vein graft placed to her RCA in 1998. A  nuclear perfusion study showed a lateral defect with minimal peri-infarction ischemia was not felt to be high risk. She has a history of atrial fibrillation and in the past was felt to be a fall risk for Coumadin.  In March 2015 she was found to have probable multiple embolic events leading to her neurologic symptoms.  Initially was on eloquence anticoagulation, but due to GI bleeding.  Ultimately, anticoagulation was discontinued and she has been maintained on aspirin.  When I saw her 7 weeks ago, she has slow AF with probable new junctional rhythm.  She was taken off Cardizem which had been 240 mg daily, but ultimately a pressure increased and diltiazem at 120 mg has been resumed.  Her blood pressure today was improved when rechecked by me at 118/76.  Her ventricular rate is now in the 70s to 80s.  I reviewed her most recent echo Doppler study which again demonstrates normal LV function and a well-seated bioprosthesis in the aortic position.  She has severe TR with moderate pulmonary hypertension and mild MR.  She will be seeing Dr. Wyn Quaker follow-up evaluation.  I reviewed recent laboratory from December.  LDL was 62 on atorvastatin 40 mg.  Her GERD is controlled with Nexium.  She continues to be on but has a pleural in addition to the Cardizem 120 and furosemide for hypertension.  There is no significant edema today.  I will see her in 6 months for reevaluation.    Time spent: 25 minutes  Troy Sine, MD, [facility] 08/26/2016 6:49 PM

## 2016-08-28 ENCOUNTER — Ambulatory Visit (INDEPENDENT_AMBULATORY_CARE_PROVIDER_SITE_OTHER): Payer: Medicare Other | Admitting: Internal Medicine

## 2016-08-28 ENCOUNTER — Encounter: Payer: Self-pay | Admitting: Internal Medicine

## 2016-08-28 VITALS — BP 128/76 | HR 73 | Temp 98.3°F | Resp 14 | Ht 62.0 in | Wt 168.2 lb

## 2016-08-28 DIAGNOSIS — E118 Type 2 diabetes mellitus with unspecified complications: Secondary | ICD-10-CM

## 2016-08-28 DIAGNOSIS — I482 Chronic atrial fibrillation: Secondary | ICD-10-CM | POA: Diagnosis not present

## 2016-08-28 DIAGNOSIS — G3184 Mild cognitive impairment, so stated: Secondary | ICD-10-CM | POA: Diagnosis not present

## 2016-08-28 DIAGNOSIS — E0590 Thyrotoxicosis, unspecified without thyrotoxic crisis or storm: Secondary | ICD-10-CM

## 2016-08-28 DIAGNOSIS — I4821 Permanent atrial fibrillation: Secondary | ICD-10-CM

## 2016-08-28 LAB — HEMOGLOBIN A1C: HEMOGLOBIN A1C: 6.9 % — AB (ref 4.6–6.5)

## 2016-08-28 LAB — T3, FREE: T3, Free: 3.4 pg/mL (ref 2.3–4.2)

## 2016-08-28 LAB — TSH: TSH: 1.42 u[IU]/mL (ref 0.35–4.50)

## 2016-08-28 LAB — T4, FREE: FREE T4: 0.74 ng/dL (ref 0.60–1.60)

## 2016-08-28 NOTE — Progress Notes (Signed)
Subjective:    Patient ID: Rebecca Skinner, female    DOB: [birth year], 83 y.o.   MRN: [id number]  DOS:  08/28/2016 Type of visit - description : Routine office visit, here with her daughter [name] Interval history: Atrial fibrillation: Note from cardiology reviewed, she seems to be stable Hyperthyroidism: Good med compliance, check a TSH. Has shoulder pain, to see orthopedic surgery DM: Due for A1c. HTN: Good med compliance, no ambulatory BPs.   Review of Systems ADLs: Able to feed herself, needs help w/ taking baths, moves well with a walker. Cough is at baseline although she is bringing up some clear mucus in the last few days. No fever chills No chest pain or lower extremity edema.   Past Medical History:  Diagnosis Date  . Anxiety 11/20/2011  . Asthma    UNDER THE [facility]ARE [facility] DR Madge Therrien  . CAD (coronary artery disease)    s/p CABG s/p AO valve replacement ([place] [facility])  . CVA (cerebral infarction) 08-2013   multiple, L, d/t Afib, started eloquis  . Diabetes mellitus    type 2   . Dizziness    Chronic, admiet 07-2010,saw neuro, thought to be a peripheral issue   . Dry skin   . GERD (gastroesophageal reflux disease)   . Hyperlipidemia   . Hypertension   . Hyperthyroidism   . Keloid    @ chest  . Memory loss   . Osteoarthritis   . Osteopenia    per dexa 12/09  . Paroxysmal atrial fibrillation ([place])    cards d/c coumadin 12/2008 d/t persisten NSR and frequent falls- restarted coumadin july 2011, now on Eliquis  . Recurrent UTI   . Shortness of breath dyspnea   . TIA (transient ischemic attack)     Past Surgical History:  Procedure Laterality Date  . ABDOMINAL HYSTERECTOMY    . AORTIC VALVE REPLACEMENT  1998  . cagb  1998  . CAROTID DOPPLER  07/13/12   BILATERAL BULB/PROXIMAL ICAS;MILD AMOUNT OF FIBROUS PLAQUE WITH NO DIAMETER REDUCTION.  . CESAREAN SECTION     x 2  . COLONOSCOPY WITH PROPOFOL N/A 03/30/2014   Procedure: COLONOSCOPY WITH PROPOFOL;  Surgeon: Irene Shipper,  MD;  Location: [facility];  Service: Endoscopy;  Laterality: N/A;  . CORONARY ARTERY BYPASS GRAFT  1998   SVG TO RCA  . HEMORRHOID SURGERY    . MYOCARDIAL PERFUSION STUDY  12/19/09   NORMAL PATTERN OF PERFUSION IN ALL REGIONS.EF 75%.  . OOPHORECTOMY    . TOTAL KNEE ARTHROPLASTY  1999  . TRANSTHORACIC ECHOCARDIOGRAM  09/11/11   LVEF >55%.STAGE 1 DIASTOLIC DYSFUNCTION,ELEVATED LV FILLING PRESSURE.BIOPROSTHETIC AORTIC VALVE-PEAK AND MEAN GRADIENTS OF 17 MMHG AND 8 MMHG.SIGMOID SEPTUM.MILD TO MOD MR.MILD TO MOD TR.RVSP 46 MMHG.    Social History   Social History  . Marital status: Divorced    Spouse name: N/A  . Number of children: 4  . Years of education: N/A   Occupational History  . retired  Retired   Social History Main Topics  . Smoking status: Former Research scientist (life sciences)  . Smokeless tobacco: Never Used     Comment: quit 2004, smoked 1.5 ppd  . Alcohol use No  . Drug use: No  . Sexual activity: Not on file   Other Topics Concern  . Not on file   Social History Narrative   Had to moved w/ her daughter [name])  ~ 01-2016   Had 3 daughter- 2 son  (lost oldest daughter and son),  2 living daughters in [place] as of 08/28/2016      Reactions   Hydrocodone Itching, Nausea Only   Tramadol Hcl Itching, Nausea Only      Medication List       Accurate as of 08/28/16 12:53 PM. Always use your most recent med list.          acetaminophen 500 MG tablet Commonly known as:  TYLENOL Take 1,000 mg by mouth every 6 (six) hours as needed for moderate pain (pain).   ALPRAZolam 0.5 MG tablet Commonly known as:  XANAX Take 1 tablet (0.5 mg total) by mouth at bedtime as needed for anxiety or sleep.   artificial tears Oint ophthalmic ointment Place into both eyes at bedtime as needed for dry eyes.   aspirin 81 MG tablet Take 81 mg by mouth daily.   atorvastatin 40 MG tablet Commonly known as:  LIPITOR Take 1 tablet (40 mg total) by mouth at bedtime.   benazepril 20  MG tablet Commonly known as:  LOTENSIN TAKE 1 TABLET BY MOUTH DAILY   budesonide 0.5 MG/2ML nebulizer solution Commonly known as:  PULMICORT USE 1 VIAL VIA NEBULIZER TWICE A DAY   CALCIUM 500 PO Take 500 mg by mouth at bedtime.   cholecalciferol 1000 units tablet Commonly known as:  VITAMIN D Take 1,000 Units by mouth at bedtime.   diltiazem 120 MG 24 hr capsule Commonly known as:  CARDIZEM CD Take 1 capsule (120 mg total) by mouth daily.   dorzolamide-timolol 22.3-6.8 MG/ML ophthalmic solution Commonly known as:  COSOPT Place 1 drop into both eyes 2 (two) times daily.   esomeprazole 40 MG capsule Commonly known as:  NEXIUM Take 1 capsule (40 mg total) by mouth daily before breakfast.   furosemide 20 MG tablet Commonly known as:  LASIX Take 2 tablets (40 mg total) by mouth daily.   levalbuterol 0.63 MG/3ML nebulizer solution Commonly known as:  XOPENEX Take 3 mLs (0.63 mg total) by nebulization 2 (two) times daily.   magnesium oxide 400 (241.3 Mg) MG tablet Commonly known as:  MAG-OX Take 1 tablet (400 mg total) by mouth daily.   methimazole 5 MG tablet Commonly known as:  TAPAZOLE Take 1 tablet (5 mg total) by mouth 3 (three) times a week.   metoprolol tartrate 25 MG tablet Commonly known as:  LOPRESSOR Take 1 tablet (25 mg total) by mouth 2 (two) times daily.   nitroGLYCERIN 0.4 MG SL tablet Commonly known as:  NITROSTAT Place 0.4 mg under the tongue every 5 (five) minutes as needed for chest pain (chest pain). Reported on 11/10/2015   PAZEO 0.7 % Soln Generic drug:  Olopatadine HCl   potassium chloride 10 MEQ tablet Commonly known as:  KLOR-CON M10 Take 1 tablet (10 mEq total) by mouth daily.   RESTASIS MULTIDOSE 0.05 % ophthalmic emulsion Generic drug:  cycloSPORINE   TRAVATAN Z 0.004 % Soln ophthalmic solution Generic drug:  Travoprost (BAK Free) Place 1 drop into both eyes at bedtime.          Objective:   Physical Exam BP 128/76 (BP  Location: Left Arm, Patient Position: Sitting, Cuff Size: Normal)   Pulse 73   Temp 98.3 F (36.8 C) (Oral)   Resp 14   Ht 5\' 2"  (1.575 m)   Wt 168 lb 4 oz (76.3 kg)   SpO2 93%   BMI 30.77 kg/m  General:   Well developed, well nourished .  NAD.  HEENT:  Normocephalic . Face symmetric, atraumatic Lungs:  CTA B Normal respiratory effort, no intercostal retractions, no accessory muscle use. Heart: irregular,  no murmur.  No pretibial edema bilaterally  Skin: Not pale. Not jaundice Neurologic:  alert & oriented to self and place, did not know what year is it , did not recall what she had for breakfast.  Speech normal, gait  assisted by a walker, looks very steady/safe Psych--  No anxious or depressed appearing.      Assessment & Plan:    Assessment DM HTN Hyperlipidemia Hyperthyroidism -- used to see Dr. Loanne Drilling, last visit 01-2016, now f/u PCP, check labs x 2 q year GERD Asthma  Anxiety  On xanax  CV: Dr. Claiborne Billings --CHF, CAD, CABG, Aortic valve replacement --P- atrial fibrillation, used to be on Coumadin, Eliquis rx after the stroke 3-15, d/c 03-2014 d/t GI bleed , on ASA 81 --TIAs, CVA 2015 DJD Osteopenia 2009 Mild cognitive impairment (discuss meds 08-2016) Recurrent UTIs Daughter  Coy Vandoren; lives w/ Nash Dimmer DM: Diet control, check A1c HTN: Continue Lotensin, Cardizem, Lasix, magnesium, Lopressor, potassium. Recent BMP satisfactory. Hypothyroidism:on tapazole, check TFTs Atrial fibrillation: Note from cardiology reviewed, she is stable on beta blockers and calcium channel blockers. On aspirin.  Mild cognitive impairment: At baseline per patient's daughter, we talk about possible medications, they are somewhat reluctant, recommend to do their own research about Aricept and Namenda. She is doing well, able to do her ADLs with some help. RTC 6 months

## 2016-08-28 NOTE — Progress Notes (Signed)
Pre visit review using our clinic review tool, if applicable. No additional management support is needed unless otherwise documented below in the visit note. 

## 2016-08-28 NOTE — Assessment & Plan Note (Signed)
DM: Diet control, check A1c HTN: Continue Lotensin, Cardizem, Lasix, magnesium, Lopressor, potassium. Recent BMP satisfactory. Hypothyroidism:on tapazole, check TFTs Atrial fibrillation: Note from cardiology reviewed, she is stable on beta blockers and calcium channel blockers. On aspirin.  Mild cognitive impairment: At baseline per patient's daughter, we talk about possible medications, they are somewhat reluctant, recommend to do their own research about Aricept and Namenda. She is doing well, able to do her ADLs with some help. RTC 6 months

## 2016-08-28 NOTE — Patient Instructions (Signed)
GO TO THE LAB : Get the blood work     GO TO THE FRONT DESK Schedule your next appointment for a  routine checkup in 6 months  Medications that could help with memory are Aricept and Namenda

## 2016-08-30 ENCOUNTER — Emergency Department (HOSPITAL_COMMUNITY): Payer: Medicare Other

## 2016-08-30 ENCOUNTER — Observation Stay (HOSPITAL_COMMUNITY)
Admission: EM | Admit: 2016-08-30 | Discharge: 2016-08-31 | Disposition: A | Discharge status: Home / Self Care | Payer: Medicare Other | Attending: Internal Medicine | Admitting: Internal Medicine

## 2016-08-30 ENCOUNTER — Observation Stay (HOSPITAL_COMMUNITY): Payer: Medicare Other

## 2016-08-30 ENCOUNTER — Encounter (HOSPITAL_COMMUNITY): Payer: Self-pay | Admitting: Radiology

## 2016-08-30 DIAGNOSIS — Z952 Presence of prosthetic heart valve: Secondary | ICD-10-CM | POA: Insufficient documentation

## 2016-08-30 DIAGNOSIS — I4891 Unspecified atrial fibrillation: Secondary | ICD-10-CM | POA: Diagnosis present

## 2016-08-30 DIAGNOSIS — G8194 Hemiplegia, unspecified affecting left nondominant side: Secondary | ICD-10-CM | POA: Diagnosis not present

## 2016-08-30 DIAGNOSIS — Z79899 Other long term (current) drug therapy: Secondary | ICD-10-CM | POA: Insufficient documentation

## 2016-08-30 DIAGNOSIS — I679 Cerebrovascular disease, unspecified: Secondary | ICD-10-CM

## 2016-08-30 DIAGNOSIS — G459 Transient cerebral ischemic attack, unspecified: Principal | ICD-10-CM

## 2016-08-30 DIAGNOSIS — I10 Essential (primary) hypertension: Secondary | ICD-10-CM | POA: Diagnosis not present

## 2016-08-30 DIAGNOSIS — E0590 Thyrotoxicosis, unspecified without thyrotoxic crisis or storm: Secondary | ICD-10-CM | POA: Diagnosis not present

## 2016-08-30 DIAGNOSIS — I110 Hypertensive heart disease with heart failure: Secondary | ICD-10-CM | POA: Diagnosis not present

## 2016-08-30 DIAGNOSIS — Z7951 Long term (current) use of inhaled steroids: Secondary | ICD-10-CM | POA: Diagnosis not present

## 2016-08-30 DIAGNOSIS — R0602 Shortness of breath: Secondary | ICD-10-CM | POA: Diagnosis not present

## 2016-08-30 DIAGNOSIS — R2990 Unspecified symptoms and signs involving the nervous system: Secondary | ICD-10-CM | POA: Diagnosis not present

## 2016-08-30 DIAGNOSIS — E785 Hyperlipidemia, unspecified: Secondary | ICD-10-CM | POA: Insufficient documentation

## 2016-08-30 DIAGNOSIS — E119 Type 2 diabetes mellitus without complications: Secondary | ICD-10-CM | POA: Insufficient documentation

## 2016-08-30 DIAGNOSIS — I480 Paroxysmal atrial fibrillation: Secondary | ICD-10-CM | POA: Insufficient documentation

## 2016-08-30 DIAGNOSIS — R4701 Aphasia: Secondary | ICD-10-CM | POA: Diagnosis not present

## 2016-08-30 DIAGNOSIS — Z96659 Presence of unspecified artificial knee joint: Secondary | ICD-10-CM | POA: Diagnosis not present

## 2016-08-30 DIAGNOSIS — I2510 Atherosclerotic heart disease of native coronary artery without angina pectoris: Secondary | ICD-10-CM | POA: Diagnosis not present

## 2016-08-30 DIAGNOSIS — Z87891 Personal history of nicotine dependence: Secondary | ICD-10-CM | POA: Diagnosis not present

## 2016-08-30 DIAGNOSIS — G451 Carotid artery syndrome (hemispheric): Secondary | ICD-10-CM

## 2016-08-30 DIAGNOSIS — Z7982 Long term (current) use of aspirin: Secondary | ICD-10-CM | POA: Insufficient documentation

## 2016-08-30 DIAGNOSIS — Z951 Presence of aortocoronary bypass graft: Secondary | ICD-10-CM | POA: Insufficient documentation

## 2016-08-30 DIAGNOSIS — I6789 Other cerebrovascular disease: Secondary | ICD-10-CM | POA: Diagnosis not present

## 2016-08-30 DIAGNOSIS — I5032 Chronic diastolic (congestive) heart failure: Secondary | ICD-10-CM | POA: Insufficient documentation

## 2016-08-30 DIAGNOSIS — J45909 Unspecified asthma, uncomplicated: Secondary | ICD-10-CM | POA: Diagnosis not present

## 2016-08-30 DIAGNOSIS — I639 Cerebral infarction, unspecified: Secondary | ICD-10-CM

## 2016-08-30 DIAGNOSIS — I6602 Occlusion and stenosis of left middle cerebral artery: Secondary | ICD-10-CM | POA: Diagnosis not present

## 2016-08-30 DIAGNOSIS — R4781 Slurred speech: Secondary | ICD-10-CM | POA: Diagnosis not present

## 2016-08-30 DIAGNOSIS — F329 Major depressive disorder, single episode, unspecified: Secondary | ICD-10-CM | POA: Insufficient documentation

## 2016-08-30 DIAGNOSIS — Z8673 Personal history of transient ischemic attack (TIA), and cerebral infarction without residual deficits: Secondary | ICD-10-CM | POA: Insufficient documentation

## 2016-08-30 DIAGNOSIS — R531 Weakness: Secondary | ICD-10-CM | POA: Diagnosis not present

## 2016-08-30 DIAGNOSIS — I482 Chronic atrial fibrillation: Secondary | ICD-10-CM | POA: Insufficient documentation

## 2016-08-30 LAB — COMPREHENSIVE METABOLIC PANEL
ALK PHOS: 82 U/L (ref 38–126)
ALT: 26 U/L (ref 14–54)
ANION GAP: 9 (ref 5–15)
AST: 31 U/L (ref 15–41)
Albumin: 3.2 g/dL — ABNORMAL LOW (ref 3.5–5.0)
BUN: 13 mg/dL (ref 6–20)
CALCIUM: 8.9 mg/dL (ref 8.9–10.3)
CO2: 26 mmol/L (ref 22–32)
CREATININE: 0.87 mg/dL (ref 0.44–1.00)
Chloride: 105 mmol/L (ref 101–111)
GFR, EST NON AFRICAN AMERICAN: 57 mL/min — AB (ref >60)
Glucose, Bld: 125 mg/dL — ABNORMAL HIGH (ref 65–99)
Potassium: 4.3 mmol/L (ref 3.5–5.1)
SODIUM: 140 mmol/L (ref 135–145)
Total Bilirubin: 0.7 mg/dL (ref 0.3–1.2)
Total Protein: 6.6 g/dL (ref 6.5–8.1)

## 2016-08-30 LAB — I-STAT TROPONIN, ED: TROPONIN I, POC: 0.02 ng/mL (ref 0.00–0.08)

## 2016-08-30 LAB — DIFFERENTIAL
BASOS PCT: 1 %
Basophils Absolute: 0 10*3/uL (ref 0.0–0.1)
EOS PCT: 2 %
Eosinophils Absolute: 0.2 10*3/uL (ref 0.0–0.7)
LYMPHS PCT: 24 %
Lymphs Abs: 1.6 10*3/uL (ref 0.7–4.0)
MONO ABS: 0.9 10*3/uL (ref 0.1–1.0)
MONOS PCT: 13 %
NEUTROS ABS: 4.1 10*3/uL (ref 1.7–7.7)
Neutrophils Relative %: 60 %

## 2016-08-30 LAB — CBC
HEMATOCRIT: 41.8 % (ref 36.0–46.0)
HEMOGLOBIN: 13.5 g/dL (ref 12.0–15.0)
MCH: 32.7 pg (ref 26.0–34.0)
MCHC: 32.3 g/dL (ref 30.0–36.0)
MCV: 101.2 fL — AB (ref 78.0–100.0)
Platelets: 173 10*3/uL (ref 150–400)
RBC: 4.13 MIL/uL (ref 3.87–5.11)
RDW: 14.3 % (ref 11.5–15.5)
WBC: 6.8 10*3/uL (ref 4.0–10.5)

## 2016-08-30 LAB — I-STAT CHEM 8, ED
BUN: 18 mg/dL (ref 6–20)
CALCIUM ION: 1.08 mmol/L — AB (ref 1.15–1.40)
Chloride: 102 mmol/L (ref 101–111)
Creatinine, Ser: 0.8 mg/dL (ref 0.44–1.00)
GLUCOSE: 124 mg/dL — AB (ref 65–99)
HCT: 40 % (ref 36.0–46.0)
HEMOGLOBIN: 13.6 g/dL (ref 12.0–15.0)
Potassium: 4.3 mmol/L (ref 3.5–5.1)
SODIUM: 141 mmol/L (ref 135–145)
TCO2: 32 mmol/L (ref 0–100)

## 2016-08-30 LAB — APTT: aPTT: 33 seconds (ref 24–36)

## 2016-08-30 LAB — CBG MONITORING, ED: Glucose-Capillary: 114 mg/dL — ABNORMAL HIGH (ref 65–99)

## 2016-08-30 LAB — PROTIME-INR
INR: 1.24
Prothrombin Time: 15.6 seconds — ABNORMAL HIGH (ref 11.4–15.2)

## 2016-08-30 LAB — MAGNESIUM: Magnesium: 2 mg/dL (ref 1.7–2.4)

## 2016-08-30 MED ORDER — CYCLOSPORINE 0.05 % OP EMUL
1.0000 [drp] | Freq: Every day | OPHTHALMIC | Status: DC
  Administered 2016-08-31: 1 [drp] via OPHTHALMIC
  Filled 2016-08-30: qty 1

## 2016-08-30 MED ORDER — POTASSIUM CHLORIDE CRYS ER 10 MEQ PO TBCR
10.0000 meq | EXTENDED_RELEASE_TABLET | Freq: Every day | ORAL | Status: DC
  Administered 2016-08-30: 10 meq via ORAL
  Filled 2016-08-30: qty 1

## 2016-08-30 MED ORDER — BENAZEPRIL HCL 20 MG PO TABS
20.0000 mg | ORAL_TABLET | Freq: Every day | ORAL | Status: DC
  Administered 2016-08-31: 20 mg via ORAL
  Filled 2016-08-30: qty 1

## 2016-08-30 MED ORDER — LATANOPROST 0.005 % OP SOLN
1.0000 [drp] | Freq: Every day | OPHTHALMIC | Status: DC
  Administered 2016-08-30: 1 [drp] via OPHTHALMIC
  Filled 2016-08-30: qty 2.5

## 2016-08-30 MED ORDER — ATORVASTATIN CALCIUM 40 MG PO TABS
40.0000 mg | ORAL_TABLET | Freq: Every day | ORAL | Status: DC
  Administered 2016-08-30: 40 mg via ORAL
  Filled 2016-08-30: qty 1

## 2016-08-30 MED ORDER — DORZOLAMIDE HCL-TIMOLOL MAL 2-0.5 % OP SOLN
1.0000 [drp] | Freq: Two times a day (BID) | OPHTHALMIC | Status: DC
  Filled 2016-08-30: qty 10

## 2016-08-30 MED ORDER — ASPIRIN 81 MG PO CHEW: 81.0000 mg | CHEWABLE_TABLET | Freq: Every day | ORAL | Status: DC

## 2016-08-30 MED ORDER — ACETAMINOPHEN 325 MG PO TABS
650.0000 mg | ORAL_TABLET | Freq: Four times a day (QID) | ORAL | Status: DC | PRN
  Administered 2016-08-30: 650 mg via ORAL
  Filled 2016-08-30: qty 2

## 2016-08-30 MED ORDER — MAGNESIUM CHLORIDE 64 MG PO TBEC
2.0000 | DELAYED_RELEASE_TABLET | Freq: Every day | ORAL | Status: DC
  Administered 2016-08-31: 128 mg via ORAL
  Filled 2016-08-30: qty 2

## 2016-08-30 MED ORDER — VITAMIN D 1000 UNITS PO TABS
1000.0000 [IU] | ORAL_TABLET | Freq: Every day | ORAL | Status: DC
  Administered 2016-08-30: 1000 [IU] via ORAL
  Filled 2016-08-30: qty 1

## 2016-08-30 MED ORDER — ACETAMINOPHEN 650 MG RE SUPP: 650.0000 mg | Freq: Four times a day (QID) | RECTAL | Status: DC | PRN

## 2016-08-30 MED ORDER — IOPAMIDOL (ISOVUE-370) INJECTION 76%
INTRAVENOUS | Status: AC
  Filled 2016-08-30: qty 100

## 2016-08-30 MED ORDER — TRAZODONE HCL 50 MG PO TABS
25.0000 mg | ORAL_TABLET | Freq: Every evening | ORAL | Status: DC | PRN
  Administered 2016-08-30: 25 mg via ORAL
  Filled 2016-08-30: qty 1

## 2016-08-30 MED ORDER — ONDANSETRON HCL 4 MG PO TABS: 4.0000 mg | ORAL_TABLET | Freq: Four times a day (QID) | ORAL | Status: DC | PRN

## 2016-08-30 MED ORDER — SODIUM CHLORIDE 0.9% FLUSH
3.0000 mL | Freq: Two times a day (BID) | INTRAVENOUS | Status: DC
  Administered 2016-08-30 – 2016-08-31 (×2): 3 mL via INTRAVENOUS

## 2016-08-30 MED ORDER — STROKE: EARLY STAGES OF RECOVERY BOOK
Freq: Once | Status: DC
  Filled 2016-08-30: qty 1

## 2016-08-30 MED ORDER — NITROGLYCERIN 0.4 MG SL SUBL: 0.4000 mg | SUBLINGUAL_TABLET | SUBLINGUAL | Status: DC | PRN

## 2016-08-30 MED ORDER — CALCIUM CARBONATE-VITAMIN D 500-200 MG-UNIT PO TABS
1.0000 | ORAL_TABLET | Freq: Every day | ORAL | Status: DC
  Administered 2016-08-30: 1 via ORAL
  Filled 2016-08-30 (×2): qty 1

## 2016-08-30 MED ORDER — HYDRALAZINE HCL 20 MG/ML IJ SOLN
10.0000 mg | INTRAMUSCULAR | Status: DC | PRN
  Administered 2016-08-30: 10 mg via INTRAVENOUS
  Filled 2016-08-30: qty 1

## 2016-08-30 MED ORDER — FUROSEMIDE 40 MG PO TABS
40.0000 mg | ORAL_TABLET | Freq: Every day | ORAL | Status: DC
  Administered 2016-08-31: 40 mg via ORAL
  Filled 2016-08-30: qty 1

## 2016-08-30 MED ORDER — ONDANSETRON HCL 4 MG/2ML IJ SOLN: 4.0000 mg | Freq: Four times a day (QID) | INTRAMUSCULAR | Status: DC | PRN

## 2016-08-30 MED ORDER — METHIMAZOLE 5 MG PO TABS: 5.0000 mg | ORAL_TABLET | ORAL | Status: DC

## 2016-08-30 MED ORDER — LEVALBUTEROL HCL 0.63 MG/3ML IN NEBU
0.6300 mg | INHALATION_SOLUTION | Freq: Two times a day (BID) | RESPIRATORY_TRACT | Status: DC
  Administered 2016-08-31: 0.63 mg via RESPIRATORY_TRACT
  Filled 2016-08-30: qty 3

## 2016-08-30 MED ORDER — POLYETHYLENE GLYCOL 3350 17 G PO PACK: 17.0000 g | PACK | Freq: Every day | ORAL | Status: DC | PRN

## 2016-08-30 MED ORDER — DILTIAZEM HCL ER COATED BEADS 120 MG PO CP24
120.0000 mg | ORAL_CAPSULE | Freq: Every day | ORAL | Status: DC
  Administered 2016-08-31: 120 mg via ORAL
  Filled 2016-08-30: qty 1

## 2016-08-30 MED ORDER — ARTIFICIAL TEARS OP OINT: TOPICAL_OINTMENT | Freq: Every evening | OPHTHALMIC | Status: DC | PRN

## 2016-08-30 MED ORDER — HEPARIN SODIUM (PORCINE) 5000 UNIT/ML IJ SOLN
5000.0000 [IU] | Freq: Three times a day (TID) | INTRAMUSCULAR | Status: DC
  Administered 2016-08-30 – 2016-08-31 (×4): 5000 [IU] via SUBCUTANEOUS
  Filled 2016-08-30 (×4): qty 1

## 2016-08-30 MED ORDER — ASPIRIN 81 MG PO CHEW
325.0000 mg | CHEWABLE_TABLET | Freq: Every day | ORAL | Status: DC
  Administered 2016-08-31: 325 mg via ORAL
  Filled 2016-08-30: qty 5

## 2016-08-30 MED ORDER — BUDESONIDE 0.5 MG/2ML IN SUSP
0.5000 mg | Freq: Two times a day (BID) | RESPIRATORY_TRACT | Status: DC
  Administered 2016-08-31: 0.5 mg via RESPIRATORY_TRACT
  Filled 2016-08-30 (×3): qty 2

## 2016-08-30 MED ORDER — METOPROLOL TARTRATE 25 MG PO TABS
25.0000 mg | ORAL_TABLET | Freq: Two times a day (BID) | ORAL | Status: DC
  Administered 2016-08-30 – 2016-08-31 (×2): 25 mg via ORAL
  Filled 2016-08-30 (×2): qty 1

## 2016-08-30 NOTE — Evaluation (Signed)
Physical Therapy Evaluation/Discharge Patient Details Name: Rebecca Skinner MRN: [id number] DOB: [birth year] Today's Date: 08/30/2016   History of Present Illness  83 y.o. female admitted to [facility] on 08/30/16 for confusion and difficulty speaking.  Stroke workup in progress.  CT (-) for acute event, MRI is pending.  Pt with significant PMhx of TIA, SOB, PAF, memory loss, HTN, DM, dizziness, CAD, CABG, aortic valve replacement.   Clinical Impression  Pt is at her baseline physical function mod I with rollator. She did not demonstrate any inequality in strength, coordination or sensation in her extremities.  She did not report any changes in her vision.  Daughter reports baseline memory issues, but not different now.  PT to sign off as pt is at her baseline.      Follow Up Recommendations No PT follow up    Equipment Recommendations  None recommended by PT    Recommendations for Other Services   NA    Precautions / Restrictions   None     Mobility  Bed Mobility Overal bed mobility: Modified Independent             General bed mobility comments: HOB very mildly elevated, used rail for leverage  Transfers Overall transfer level: Modified independent Equipment used: 4-wheeled walker                Ambulation/Gait Ambulation/Gait assistance: Modified independent (Device/Increase time) Ambulation Distance (Feet): 200 Feet Assistive device: Rolling walker (2 wheeled) Gait Pattern/deviations: WFL(Within Functional Limits)   Gait velocity interpretation: at or above normal speed for age/gender General Gait Details: Quick gait speed without LOB, turned right, left, and 180 without difficulty.   Stairs Stairs: Yes Stairs assistance: Modified independent (Device/Increase time) Stair Management: Two rails;Alternating pattern;Step to pattern;Forwards Number of Stairs: 5 General stair comments: alternating up, step to pattern on the way down.   Wheelchair Mobility    Modified  Rankin (Stroke Patients Only) Modified Rankin (Stroke Patients Only) Pre-Morbid Rankin Score: No symptoms Modified Rankin: No symptoms     Balance Overall balance assessment: Needs assistance Sitting-balance support: Feet supported;No upper extremity supported Sitting balance-Leahy Scale: Good     Standing balance support: No upper extremity supported;Bilateral upper extremity supported;Single extremity supported Standing balance-Leahy Scale: Fair Standing balance comment: needs RW, but that is her baseline                             Pertinent Vitals/Pain Pain Assessment: No/denies pain    Home Living Family/patient expects to be discharged to:: Private residence Living Arrangements: Children (daughter) Available Help at Discharge: Family;Available 24 hours/day;Other (Comment) (daughter reports she is there, leaves sometimes) Type of Home: House Home Access: Stairs to enter Entrance Stairs-Rails: None Entrance Stairs-Number of Steps: 2 Home Layout: One level Home Equipment: Walker - 4 wheels;Wheelchair - Brewing technologist      Prior Function Level of Independence: Needs assistance   Gait / Transfers Assistance Needed: uses rollator for amulation in the house  ADL's / Homemaking Assistance Needed: sponge bathes, daughter tries to keep her from doing chores like dishes or laundry        Hand Dominance   Dominant Hand: Right    Extremity/Trunk Assessment   Upper Extremity Assessment Upper Extremity Assessment: Overall WFL for tasks assessed (4/5 strength, equal grip, coordination WNL equal, sensa WNL)    Lower Extremity Assessment Lower Extremity Assessment: Overall WFL for tasks assessed (4/5 strength, equal grip, coordination WNL equal,  sensa WNL)    Cervical / Trunk Assessment Cervical / Trunk Assessment: Normal  Communication   Communication: No difficulties  Cognition Arousal/Alertness: Awake/alert Behavior During Therapy: WFL for tasks  assessed/performed Overall Cognitive Status: History of cognitive impairments - at baseline                                 General Comments: Memory issues, per daughter she would not have known Month/year PTA.              Assessment/Plan    PT Assessment Patent does not need any further PT services         PT Goals (Current goals can be found in the Care Plan section)  Acute Rehab PT Goals Patient Stated Goal: Daughter wants to make sure she is ok PT Goal Formulation: All assessment and education complete, DC therapy     End of Session Equipment Utilized During Treatment: Gait belt Activity Tolerance: Patient tolerated treatment well Patient left: in bed;with call bell/phone within reach;with bed alarm set;with family/visitor present;with nursing/sitter in room Nurse Communication: Mobility status PT Visit Diagnosis: Unsteadiness on feet (R26.81)    Time: 6283-1517 PT Time Calculation (min) (ACUTE ONLY): 25 min   Charges:   PT Evaluation $PT Eval Moderate Complexity: 1 Procedure PT Treatments $Gait Training: 8-22 mins   PT G Codes:   PT G-Codes **NOT FOR INPATIENT CLASS** Functional Assessment Tool Used: AM-PAC 6 Clicks Basic Mobility Functional Limitation: Mobility: Walking and moving around Mobility: Walking and Moving Around Current Status (O1607): 0 percent impaired, limited or restricted Mobility: Walking and Moving Around Goal Status (P7106): 0 percent impaired, limited or restricted Mobility: Walking and Moving Around Discharge Status (Y6948): 0 percent impaired, limited or restricted   Jerome Otter B. [place], [place], DPT [phone]     08/30/2016, 5:58 PM

## 2016-08-30 NOTE — H&P (Signed)
History and Physical    Rebecca Skinner OBS:[id number] DOB: [birth year] DOA: 08/30/2016  PCP: Kathlene November, MD   Patient coming from: home  Chief Complaint:  HPI: Rebecca Skinner is a 83 y.o. female with medical history significant for CAD, old stroke, Afib -currently off coumadin d/t GI bleed and now on baby Aspirin, DM - currently not on any medications, asthma, HTN, hyperlipidemia, hyperthyroidism, treared with Topazol who presented to the ED via EMS after she developed aphasia early morning at home She woke up this morning feeling normal, had a breakfast around 6:30 and  returned to her room. Around 9 ma her daughter found the patient sitting on the bed looking confused and unable to speak.[facility] was called and delivered patient to the ED. After arrival to the ED  Her speech returned and she reported that during the episode of aphasia she knew what she wanted to say, but the words did not come out of her mouth.  ED Course:  On arrival patient had stable VS except mildly elevated systolic BP to 476 mmHg, blood work unremarkable except mild hyperglycemia with glucose of 125 ng/dL EKG demonstrated Afib with controlled VRm no acute ischemic changes Head CT didn't reveal any evidence of acute infarct and demonstrated chronic bilateral MCA infarcts and chronic small vessel ischemic disease EEG, MRI and MRA are pending Patient underwent echocardiogram on March 3 when she saw her cardiologist and it showed normal EF 50-55%, no wall motion abnormalities, mild LVH  Review of Systems: As per HPI otherwise 10 point review of systems negative.   Ambulatory Status: Roller walker  Past Medical History:  Diagnosis Date  . Anxiety 11/20/2011  . Asthma    UNDER THE CARE [facility] DR PAZ  . CAD (coronary artery disease)    s/p CABG s/p AO valve replacement ([facility] [place] [facility])  . CVA (cerebral infarction) 08-2013   multiple, L, d/t Afib, started eloquis  . Diabetes mellitus    type 2   . Dizziness    Chronic, admiet  07-2010,saw neuro, thought to be a peripheral issue   . Dry skin   . GERD (gastroesophageal reflux disease)   . Hyperlipidemia   . Hypertension   . Hyperthyroidism   . Keloid    @ chest  . Memory loss   . Osteoarthritis   . Osteopenia    per dexa 12/09  . Paroxysmal atrial fibrillation ([place])    cards d/c coumadin 12/2008 d/t persisten NSR and frequent falls- restarted coumadin july 2011, now on Eliquis  . Recurrent UTI   . Shortness of breath dyspnea   . TIA (transient ischemic attack)     Past Surgical History:  Procedure Laterality Date  . ABDOMINAL HYSTERECTOMY    . AORTIC VALVE REPLACEMENT  1998  . cagb  1998  . CAROTID DOPPLER  07/13/12   BILATERAL BULB/PROXIMAL ICAS;MILD AMOUNT OF FIBROUS PLAQUE WITH NO DIAMETER REDUCTION.  . CESAREAN SECTION     x 2  . COLONOSCOPY WITH PROPOFOL N/A 03/30/2014   Procedure: COLONOSCOPY WITH PROPOFOL;  Surgeon: Irene Shipper, MD;  Location: [facility] ENDOSCOPY;  Service: Endoscopy;  Laterality: N/A;  . CORONARY ARTERY BYPASS GRAFT  1998   SVG TO RCA  . HEMORRHOID SURGERY    . MYOCARDIAL PERFUSION STUDY  12/19/09   NORMAL PATTERN OF PERFUSION IN ALL REGIONS.EF 75%.  . OOPHORECTOMY    . TOTAL KNEE ARTHROPLASTY  1999  . TRANSTHORACIC ECHOCARDIOGRAM  09/11/11   LVEF >55%.STAGE 1 DIASTOLIC  DYSFUNCTION,ELEVATED LV FILLING PRESSURE.BIOPROSTHETIC AORTIC VALVE-PEAK AND MEAN GRADIENTS OF 17 MMHG AND 8 MMHG.SIGMOID SEPTUM.MILD TO MOD MR.MILD TO MOD TR.RVSP 46 MMHG.    Social History   Social History  . Marital status: Divorced    Spouse name: N/A  . Number of children: 4  . Years of education: N/A   Occupational History  . retired  Retired   Social History Main Topics  . Smoking status: Former Research scientist (life sciences)  . Smokeless tobacco: Never Used     Comment: quit 2004, smoked 1.5 ppd  . Alcohol use No  . Drug use: No  . Sexual activity: Not on file   Other Topics Concern  . Not on file   Social History Narrative   Had to moved w/ her daughter [name])  ~  01-2016   Had 3 daughter- 2 son  (lost oldest daughter and son), 2 living daughters in [place]       Allergies  Allergen Reactions  . Hydrocodone Itching and Nausea Only  . Tramadol Hcl Itching and Nausea Only    Family History  Problem Relation Age of Onset  . Heart attack Father   . Coronary artery disease Son 4    deceased  . Hypertension Brother   . Stroke Brother   . Colon cancer Neg Hx   . Breast cancer Neg Hx   . Thyroid disease Neg Hx     Prior to Admission medications   Medication Sig Start Date End Date Taking? Authorizing Provider  acetaminophen (TYLENOL) 500 MG tablet Take 1,000 mg by mouth every 6 (six) hours as needed for moderate pain (pain).   Yes Historical Provider, MD  artificial tears (LACRILUBE) OINT ophthalmic ointment Place into both eyes at bedtime as needed for dry eyes. 09/23/15  Yes Robyn Haber, MD  aspirin 81 MG tablet Take 81 mg by mouth daily.   Yes Historical Provider, MD  atorvastatin (LIPITOR) 40 MG tablet Take 1 tablet (40 mg total) by mouth at bedtime. 07/18/16  Yes Colon Branch, MD  benazepril (LOTENSIN) 20 MG tablet TAKE 1 TABLET BY MOUTH DAILY 08/15/16  Yes Troy Sine, MD  budesonide (PULMICORT) 0.5 MG/2ML nebulizer solution USE 1 VIAL VIA NEBULIZER TWICE A DAY 06/06/16  Yes Colon Branch, MD  Calcium Carbonate (CALCIUM 500 PO) Take 500 mg by mouth at bedtime.    Yes Historical Provider, MD  cholecalciferol (VITAMIN D) 1000 UNITS tablet Take 1,000 Units by mouth at bedtime.    Yes Historical Provider, MD  diltiazem (CARDIZEM CD) 120 MG 24 hr capsule Take 1 capsule (120 mg total) by mouth daily. 08/20/16 11/18/16 Yes Troy Sine, MD  dorzolamide-timolol (COSOPT) 22.3-6.8 MG/ML ophthalmic solution Place 1 drop into both eyes 2 (two) times daily.  03/23/14  Yes Historical Provider, MD  furosemide (LASIX) 20 MG tablet Take 2 tablets (40 mg total) by mouth daily. 08/14/16  Yes Colon Branch, MD  levalbuterol Penne Lash) 0.63 MG/3ML nebulizer solution  Take 3 mLs (0.63 mg total) by nebulization 2 (two) times daily. 11/10/15  Yes Colon Branch, MD  magnesium oxide (MAG-OX) 400 (241.3 Mg) MG tablet Take 1 tablet (400 mg total) by mouth daily. Patient taking differently: Take 1 tablet by mouth at bedtime.  05/06/16  Yes Colon Branch, MD  methimazole (TAPAZOLE) 5 MG tablet Take 1 tablet (5 mg total) by mouth 3 (three) times a week. Patient taking differently: Take 5 mg by mouth every Monday, Wednesday, and Friday.  04/12/16  Yes Colon Branch, MD  metoprolol tartrate (LOPRESSOR) 25 MG tablet Take 1 tablet (25 mg total) by mouth 2 (two) times daily. 05/14/16  Yes Colon Branch, MD  potassium chloride (KLOR-CON M10) 10 MEQ tablet Take 1 tablet (10 mEq total) by mouth daily. Patient taking differently: Take 10 mEq by mouth at bedtime.  05/06/16  Yes Colon Branch, MD  RESTASIS MULTIDOSE 0.05 % ophthalmic emulsion Place 1 drop into both eyes daily.  05/13/16  Yes Historical Provider, MD  TRAVATAN Z 0.004 % SOLN ophthalmic solution Place 1 drop into both eyes at bedtime.  06/16/13  Yes Historical Provider, MD  ALPRAZolam Duanne Moron) 0.5 MG tablet Take 1 tablet (0.5 mg total) by mouth at bedtime as needed for anxiety or sleep. Patient not taking: Reported on 08/30/2016 10/16/15   Colon Branch, MD  esomeprazole (NEXIUM) 40 MG capsule Take 1 capsule (40 mg total) by mouth daily before breakfast. Patient not taking: Reported on 08/30/2016 01/10/16   Colon Branch, MD  nitroGLYCERIN (NITROSTAT) 0.4 MG SL tablet Place 0.4 mg under the tongue every 5 (five) minutes as needed for chest pain (chest pain). Reported on 11/10/2015    Historical Provider, MD    Physical Exam: Vitals:   08/30/16 1100 08/30/16 1115 08/30/16 1130 08/30/16 1145  BP: (!) 161/93 (!) 157/67 (!) 146/75 (!) 151/117  Pulse: 65 65 62 61  Resp: 19 18 18 16   Temp:      TempSrc:      SpO2: 99% 100% 100% 99%  Weight:      Height:         General: Appears calm and comfortable Eyes: PERRLA, EOMI, normal lids, iris ENT:   grossly normal hearing, lips & tongue, mucous membranes moist and intact Neck: no lymphoadenopathy, masses or thyromegaly Cardiovascular: RRR, no m/r/g. No JVD, carotid bruits. No LE edema.  Respiratory: bilateral no wheezes, rales, rhonchi or cracles. Normal respiratory effort. No accessory muscle use observed Abdomen: soft, non-tender, non-distended, no organomegaly or masses appreciated. BS present in all quadrants Skin: no rash, ulcers or induration seen on limited exam Musculoskeletal: grossly normal tone BUE/BLE, good ROM, no bony abnormality or joint deformities observed Psychiatric: grossly normal mood and affect, speech fluent and appropriate, alert and oriented x3 Neurologic: CN II-XII grossly intact, moves all extremities in coordinated fashion, sensation intact  Labs on Admission: I have personally reviewed following labs and imaging studies  CBC, BMP  GFR: Estimated Creatinine Clearance: 44.3 mL/min (by C-G formula based on SCr of 0.8 mg/dL).   Creatinine Clearance: Estimated Creatinine Clearance: 44.3 mL/min (by C-G formula based on SCr of 0.8 mg/dL).    Radiological Exams on Admission: Dg Chest 2 View  Result Date: 08/30/2016 CLINICAL DATA:  Shortness of breath.  Confusion. EXAM: CHEST  2 VIEW COMPARISON:  06/21/2016. FINDINGS: Prior CABG. Cardiac valve replacement. Cardiomegaly. No focal pulmonary infiltrate. No pleural effusion or pneumothorax. Degenerative changes thoracic spine. IMPRESSION: 1. Prior CABG.  Cardiac valve replacement.  Cardiomegaly. 2. No focal pulmonary infiltrate. Electronically Signed   By: Marcello Moores  Register   On: 08/30/2016 10:34   Ct Head Code Stroke W/o Cm  Result Date: 08/30/2016 CLINICAL DATA:  Code stroke.  Aphasia. EXAM: CT HEAD WITHOUT CONTRAST TECHNIQUE: Contiguous axial images were obtained from the base of the skull through the vertex without intravenous contrast. COMPARISON:  03/05/2016 FINDINGS: Brain: Chronic bilateral MCA territory  infarcts do not appear significantly changed and involve the left insula, bilateral frontal lobes, and  right parietal lobe. There is no definite evidence of new cortically based infarct, acute intracranial hemorrhage, mass, midline shift, or extra-axial fluid collection. There is mild global cerebral atrophy. Bilateral periventricular white matter hypodensities are similar to the prior study and nonspecific but compatible with moderate chronic small vessel ischemic disease. Vascular: Calcified atherosclerosis at the skullbase. No hyperdense vessel. Skull: No fracture or focal osseous lesion. Sinuses/Orbits: Paranasal sinuses and mastoid air cells are clear. Unremarkable orbits. Other: None. ASPECTS [facility] Stroke Program Early CT Score) - Ganglionic level infarction (caudate, lentiform nuclei, internal capsule, insula, M1-M3 cortex): 7 - Supraganglionic infarction (M4-M6 cortex): 3 Total score (0-10 with 10 being normal): 10 IMPRESSION: 1. No evidence of acute intracranial hemorrhage or definite acute infarct. 2. ASPECTS is 10. 3. Chronic bilateral MCA infarcts and moderate chronic small vessel ischemic disease. Results sent via text page to Dr. Shon Hale on 08/30/2016 at 9:50 a.m. Electronically Signed   By: Logan Bores M.D.   On: 08/30/2016 09:51    EKG: Independently reviewed - A. fib with controlled heart rate, normal acute ischemic changes Assessment/Plan Principal Problem:   TIA (transient ischemic attack) Active Problems:   Hyperthyroidism   HTN (hypertension)   ATRIAL FIBRILLATION, chronic   Asthma, chronic   Chronic atrial fibrillation (HCC)   TIA versus acute stroke versus seizures CT of the head was negative for acute stroke Neurology has seen patient and will be following up along during her hospitalization MRI/MRA of the brain is pending Reported fresh echocardiogram EEG will be done later today Neurology recommended to increase her daily dose of aspirin from 81mg  -->325 mg  Continue  telemetry monitoring, obtain fasting lipid profile and hemoglobin A1c and  Hyperthyroidism Continue Tapazole  Asthma  Currently stable without active symptoms Continue home nebulizer therapy  Chronic A. Fib CHA2-DS2-VASc is 7 which estimates approximately risk of stroke at 11.2% Currently rate controlled, continue aspirin at adjusted dose 325 mg daily for secondary prophylaxis as patient has a history of stroke Will defer to neurology the decision regarding re-initiating anticoagulation therapy given her advanced age and history of GI bleed   Hypertension - currently stable Continue home medication and adjust the doses if needed depending on the BP readings, however, patient has a permissive HTN now with parameters of allowing BP increase to 220/110 mm Hg  DVT prophylaxis: Heparin Code Status: full Family Communication: at bedside Disposition Plan: telemetry Consults called: neurology by EDP Admission status: observation   York Grice, [place] Pager: [phone] [facility]  If 7PM-7AM, please contact night-coverage [email or web address] Password [facility]  08/30/2016, 12:22 PM   I saw and evaluated the patient, personally took the key portions of history and physical exam. I agree with Edwina Barth PA-C, medical decision-making, further comments are as follows.  This is a 83 year old female who presented to the hospital due to a focal neurologic deficit, aphasia. It was sudden, noticed by her daughter, currently improving. No improving or worsening factors, no loss of his symptoms. Patient had similar episodes of aphasia in the recent past which were more brief in nature.  Her vital signs blood pressure 147/95, heart rate 64, respiratory rate 19, oxygen saturation 99%. Clinically nonfocal, her lungs are clear to auscultation, heart S1-S2 present and irregular, abdomen soft, lower extremity no edema. Sodium 141, potassium 4.3, chloride 102, glucose 124, BUN 18, creatinine  0.80, hemoglobin 13.6, hematocrit 40.2. EKG personally reviewed showed atrial fibrillation with rate of 72 bpm, positive premature ventricular complexes. Chest film is an AP film which  was personally reviewed, rotation to the right side, cardiomegaly, sternotomy wires in place, no infiltrates, effusions or signs of pneumothorax. Echocardiogram from February 3754 normal systolic LV function, bioprosthetic aortic valve functioning well. Head CT with chronic bilateral MCA infarcts and moderate chronic small vessel ischemic disease, no acute changes.  Working diagnosis aphasia, due to transfer ischemic attack, likely related to atrial fibrillation.  1. Transitory ischemic attack. Patient will admitted to the medical floor with telemetry monitor, anticoagulation with full dose aspirin per neurology recommendations, complete workup with carotid ultrasound, head MRI MRA. Followed further neurology recommendations. Continue high potency statin with atorvastatin 40 mg daily. Neuro checks every 4 hours, physical therapy evaluation.  2. Hypertension. Continue benazepril, metoprolol and diltiazem. Avoid hypotension, target blood pressure 360 systolic during the first 24-48 hours after ischemic event.   3. Atrial fibrillation, chronic, persistent. Continue rate control with diltiazem and metoprolol, antiplatelet therapy with full dose aspirin. She has been deemed not candidate for full anticoagulation in the past.  4. Depression. Continue trazodone  5. Hyperthyroid. Continue methimazole

## 2016-08-30 NOTE — ED Notes (Addendum)
Pt resting quietly speaks easily and repeats phrases well. MAEW resp non labored.

## 2016-08-30 NOTE — ED Notes (Signed)
Pt voids 100 cc to female urinal.

## 2016-08-30 NOTE — ED Triage Notes (Signed)
Pt arrives [id number] from home where she had breakfast and returned to room. At 0800. daughter found her sitting on side of bed and could not respond to questions. MAEW and followed commands but unable to speak. Pt arrives to bridge wherre labs drawn and then to CT 1.

## 2016-08-30 NOTE — Consult Note (Signed)
Requesting Physician: ED MD    Chief Complaint: non vocal possible aphasia  History obtained from:  Patient     HPI:                                                                                                                                         Rebecca Skinner is an 83 y.o. female who has Afib but is on ASA due to Hx GI bleed. Today she awoke at 0630 and was normal. Had a bath and told her daughters she felt cold.  At 0800 family noted she was not talking and had a hard time recognizing family. and seemed to have tome UE weakness on the left side. In route she was not talking and would nod her head she had a hard time finding her words. While in CT scanner she was talking clearly and answering all questions. She has no HA or difficulty with following instructions. CT head sowed no bleed or acute stroke but did show multiple old bilateral stroke.    Known history of memory decline but not diagnosed with dementia  Date last known well: Date: 08/30/2016 Time last known well: Time: 08:00 tPA Given: No: symptoms resolved   Past Medical History:  Diagnosis Date  . Anxiety 11/20/2011  . Asthma    [facility] DR PAZ  . CAD (coronary artery disease)    s/p CABG s/p AO valve replacement ([place] [facility])  . CVA (cerebral infarction) 08-2013   multiple, L, d/t Afib, started eloquis  . Diabetes mellitus    type 2   . Dizziness    Chronic, admiet 07-2010,saw neuro, thought to be a peripheral issue   . Dry skin   . GERD (gastroesophageal reflux disease)   . Hyperlipidemia   . Hypertension   . Hyperthyroidism   . Keloid    @ chest  . Memory loss   . Osteoarthritis   . Osteopenia    per dexa 12/09  . Paroxysmal atrial fibrillation ([place])    cards d/c coumadin 12/2008 d/t persisten NSR and frequent falls- restarted coumadin july 2011, now on Eliquis  . Recurrent UTI   . Shortness of breath dyspnea   . TIA (transient ischemic attack)     Past Surgical History:  Procedure  Laterality Date  . ABDOMINAL HYSTERECTOMY    . AORTIC VALVE REPLACEMENT  1998  . cagb  1998  . CAROTID DOPPLER  07/13/12   BILATERAL BULB/PROXIMAL ICAS;MILD AMOUNT OF FIBROUS PLAQUE WITH NO DIAMETER REDUCTION.  . CESAREAN SECTION     x 2  . COLONOSCOPY WITH PROPOFOL N/A 03/30/2014   Procedure: COLONOSCOPY WITH PROPOFOL;  Surgeon: Irene Shipper, MD;  Location: [facility];  Service: Endoscopy;  Laterality: N/A;  . CORONARY ARTERY BYPASS GRAFT  1998   SVG TO RCA  . HEMORRHOID SURGERY    . MYOCARDIAL PERFUSION STUDY  12/19/09   NORMAL PATTERN OF PERFUSION IN ALL REGIONS.EF 75%.  . OOPHORECTOMY    . TOTAL KNEE ARTHROPLASTY  1999  . TRANSTHORACIC ECHOCARDIOGRAM  09/11/11   LVEF >55%.STAGE 1 DIASTOLIC DYSFUNCTION,ELEVATED LV FILLING PRESSURE.BIOPROSTHETIC AORTIC VALVE-PEAK AND MEAN GRADIENTS OF 17 MMHG AND 8 MMHG.SIGMOID SEPTUM.MILD TO MOD MR.MILD TO MOD TR.RVSP 46 MMHG.    Family History  Problem Relation Age of Onset  . Heart attack Father   . Coronary artery disease Son 70    deceased  . Hypertension Brother   . Stroke Brother   . Colon cancer Neg Hx   . Breast cancer Neg Hx   . Thyroid disease Neg Hx    Social History:  reports that she has quit smoking. She has never used smokeless tobacco. She reports that she does not drink alcohol or use drugs.  Allergies:  Allergies  Allergen Reactions  . Hydrocodone Itching and Nausea Only  . Tramadol Hcl Itching and Nausea Only    Medications:                                                                                                                           No current facility-administered medications for this encounter.    Current Outpatient Prescriptions  Medication Sig Dispense Refill  . acetaminophen (TYLENOL) 500 MG tablet Take 1,000 mg by mouth every 6 (six) hours as needed for moderate pain (pain).    Marland Kitchen ALPRAZolam (XANAX) 0.5 MG tablet Take 1 tablet (0.5 mg total) by mouth at bedtime as needed for anxiety or sleep. 30  tablet 3  . artificial tears (LACRILUBE) OINT ophthalmic ointment Place into both eyes at bedtime as needed for dry eyes. 3.5 g 11  . aspirin 81 MG tablet Take 81 mg by mouth daily.    Marland Kitchen atorvastatin (LIPITOR) 40 MG tablet Take 1 tablet (40 mg total) by mouth at bedtime. 90 tablet 2  . benazepril (LOTENSIN) 20 MG tablet TAKE 1 TABLET BY MOUTH DAILY 90 tablet 3  . budesonide (PULMICORT) 0.5 MG/2ML nebulizer solution USE 1 VIAL VIA NEBULIZER TWICE A DAY 120 mL 5  . Calcium Carbonate (CALCIUM 500 PO) Take 500 mg by mouth at bedtime.     . cholecalciferol (VITAMIN D) 1000 UNITS tablet Take 1,000 Units by mouth at bedtime.     Marland Kitchen diltiazem (CARDIZEM CD) 120 MG 24 hr capsule Take 1 capsule (120 mg total) by mouth daily. 90 capsule 3  . dorzolamide-timolol (COSOPT) 22.3-6.8 MG/ML ophthalmic solution Place 1 drop into both eyes 2 (two) times daily.     Marland Kitchen esomeprazole (NEXIUM) 40 MG capsule Take 1 capsule (40 mg total) by mouth daily before breakfast. 90 capsule 1  . furosemide (LASIX) 20 MG tablet Take 2 tablets (40 mg total) by mouth daily. 180 tablet 1  . levalbuterol (XOPENEX) 0.63 MG/3ML nebulizer solution Take 3 mLs (0.63 mg total) by nebulization 2 (two) times daily. 180 mL 5  . magnesium oxide (MAG-OX)  400 (241.3 Mg) MG tablet Take 1 tablet (400 mg total) by mouth daily. 90 tablet 2  . methimazole (TAPAZOLE) 5 MG tablet Take 1 tablet (5 mg total) by mouth 3 (three) times a week. 40 tablet 1  . metoprolol tartrate (LOPRESSOR) 25 MG tablet Take 1 tablet (25 mg total) by mouth 2 (two) times daily.    . nitroGLYCERIN (NITROSTAT) 0.4 MG SL tablet Place 0.4 mg under the tongue every 5 (five) minutes as needed for chest pain (chest pain). Reported on 11/10/2015    . PAZEO 0.7 % SOLN     . potassium chloride (KLOR-CON M10) 10 MEQ tablet Take 1 tablet (10 mEq total) by mouth daily. 90 tablet 2  . RESTASIS MULTIDOSE 0.05 % ophthalmic emulsion     . TRAVATAN Z 0.004 % SOLN ophthalmic solution Place 1 drop into  both eyes at bedtime.        ROS:                                                                                                                                       History obtained from the patient  General ROS: negative for - chills, fatigue, fever, night sweats, weight gain or weight loss Psychological ROS: negative for - behavioral disorder, hallucinations, memory difficulties, mood swings or suicidal ideation Ophthalmic ROS: negative for - blurry vision, double vision, eye pain or loss of vision ENT ROS: negative for - epistaxis, nasal discharge, oral lesions, sore throat, tinnitus or vertigo Allergy and Immunology ROS: negative for - hives or itchy/watery eyes Hematological and Lymphatic ROS: negative for - bleeding problems, bruising or swollen lymph nodes Endocrine ROS: negative for - galactorrhea, hair pattern changes, polydipsia/polyuria or temperature intolerance Respiratory ROS: positivefor -  shortness of breath or wheezing Cardiovascular ROS: negative for - chest pain, dyspnea on exertion, edema or irregular heartbeat Gastrointestinal ROS: negative for - abdominal pain, diarrhea, hematemesis, nausea/vomiting or stool incontinence Genito-Urinary ROS: negative for - dysuria, hematuria, incontinence or urinary frequency/urgency Musculoskeletal ROS: negative for - joint swelling or muscular weakness Neurological ROS: as noted in HPI Dermatological ROS: negative for rash and skin lesion changes  Neurologic Examination:                                                                                                      Weight 78.1 kg (172 lb 2.9 oz).  HEENT-  Normocephalic, no lesions, without obvious abnormality.  Normal external eye and conjunctiva.  Normal TM's bilaterally.  Normal auditory canals and external ears. Normal external nose, mucus membranes and septum.  Normal pharynx. Cardiovascular- irregularly irregular rhythm, pulses palpable throughout   Lungs- audible  wheezing Abdomen- normal findings: bowel sounds normal Extremities- no edema Lymph-no adenopathy palpable Musculoskeletal-no joint tenderness, deformity or swelling Skin-warm and dry, no hyperpigmentation, vitiligo, or suspicious lesions  Neurological Examination Mental Status: Alert, oriented to hospital, age and birthdate.  Speech soft but fluent without evidence of aphasia.  Able to follow simple commands without difficulty. Cranial Nerves: II: Visual fields grossly normal,  III,IV, VI: ptosis not present, extra-ocular motions intact bilaterally, pupils equal, round, reactive to light and accommodation V,VII: smile symmetric, facial light touch sensation normal bilaterally VIII: hearing normal bilaterally IX,X: uvula rises symmetrically XI: bilateral shoulder shrug XII: midline tongue extension Motor: Right : Upper extremity   5/5    Left:     Upper extremity   5/5  Lower extremity   5/5     Lower extremity   5/5 Tone and bulk:normal tone throughout; no atrophy noted Sensory: Pinprick and light touch intact throughout, bilaterally Deep Tendon Reflexes: 2+ and symmetric throughout Plantars: Right: downgoing   Left: downgoinupgoingg Cerebellar: normal finger-to-nose, normal rapid alternating movements and normal heel-to-shin test Gait: not tested       Lab Results: Basic Metabolic Panel:  Recent Labs Lab 08/30/16 0931  NA 141  K 4.3  CL 102  GLUCOSE 124*  BUN 18  CREATININE 0.80    Liver Function Tests: No results for input(s): AST, ALT, ALKPHOS, BILITOT, PROT, ALBUMIN in the last 168 hours. No results for input(s): LIPASE, AMYLASE in the last 168 hours. No results for input(s): AMMONIA in the last 168 hours.  CBC:  Recent Labs Lab 08/30/16 0926 08/30/16 0931  WBC 6.8  --   NEUTROABS 4.1  --   HGB 13.5 13.6  HCT 41.8 40.0  MCV 101.2*  --   PLT 173  --     Cardiac Enzymes: No results for input(s): CKTOTAL, CKMB, CKMBINDEX, TROPONINI in the last 168  hours.  Lipid Panel: No results for input(s): CHOL, TRIG, HDL, CHOLHDL, VLDL, LDLCALC in the last 168 hours.  CBG:  Recent Labs Lab 08/30/16 0924  GLUCAP 114*    Microbiology: Results for orders placed or performed during the hospital encounter of 03/05/16  Urine culture     Status: Abnormal   Collection Time: 03/05/16  3:40 PM  Result Value Ref Range Status   Specimen Description URINE, CATHETERIZED  Final   Special Requests Normal  Final   Culture MULTIPLE SPECIES PRESENT, SUGGEST RECOLLECTION (A)  Final   Report Status 03/06/2016 FINAL  Final  Gram stain     Status: None   Collection Time: 03/05/16  3:40 PM  Result Value Ref Range Status   Specimen Description URINE, CLEAN CATCH  Final   Special Requests NONE  Final   Gram Stain   Final    WBC PRESENT,BOTH PMN AND MONONUCLEAR GRAM POSITIVE COCCI GRAM NEGATIVE RODS GRAM POSITIVE RODS GRAM VARIABLE ROD CYTOSPIN SMEAR    Report Status 03/05/2016 FINAL  Final    Coagulation Studies: No results for input(s): LABPROT, INR in the last 72 hours.  Imaging: No results found.     Assessment and plan discussed with with attending physician and they are in agreement.    Etta Quill PA-C [facility] [phone]  08/30/2016, 9:43 AM   Assessment: 83 y.o. female brought to ED as a code stroke, on arrival all symptoms  resolved and patient was not a tPA candidate. Cannot rule out TIA or less likely but possible seizure given her old strokes. Currently wheezing and SOB.  Given her history of stroke and risk factors she would do well to have Stroke work up in addition EEG to evaluate for possible seizure.    Stroke Risk Factors - atrial fibrillation, hyperlipidemia and hypertension   1. HgbA1c, fasting lipid panel 2. MRI, MRA  of the brain without contrast 3. PT consult, OT consult, Speech consult 4. Echocardiogram 5. Carotid dopplers 6. Prophylactic therapy-Antiplatelet med: Aspirin - dose 325 mg daily 7.  Risk factor modification 8. Telemetry monitoring 9. Frequent neuro checks 10 NPO until passes stroke swallow screen 11 EEG 12 please page stroke NP  Or  PA  Or MD from 8am -4 pm  as this patient from this time will be  followed by the stroke.   You can look them up on [email or web address]  Password [email or web address]

## 2016-08-30 NOTE — ED Provider Notes (Signed)
[facility] DEPT Provider Note   CSN: [id number] Arrival date & time: 08/30/16  0086     History   Chief Complaint No chief complaint on file.   HPI Rebecca Skinner is a 83 y.o. female.  The history is provided by the patient and medical records.    83 year old female with history of anxiety, asthma, coronary artery disease, prior history of CVA without residual deficits, diabetes, recurrent dizziness, hyperlipidemia, hypertension, hypothyroidism, history of A. fib not currently on anticoagulation secondary to GI bleeding, presenting to the ED as a code stroke. Patient awoke at 6:30 AM this morning and had breakfast as normal with her daughter. She walked back to her room and sat on the edge of the bed around 8 AM and daughter noticed that she would not speak so 911 was called. EMS reports patient initially had some mild weakness on the left side which seems to have resolved at this time. Reports patient has continued having difficulty repeating even simple words. She has not provided any verbal responses. She has grunted in response to pain and seems to be following commands. She is currently on daily aspirin. She is from home.  Past Medical History:  Diagnosis Date  . Anxiety 11/20/2011  . Asthma    UNDER THE CARE [facility] DR PAZ  . CAD (coronary artery disease)    s/p CABG s/p AO valve replacement ([place] [facility])  . CVA (cerebral infarction) 08-2013   multiple, L, d/t Afib, started eloquis  . Diabetes mellitus    type 2   . Dizziness    Chronic, admiet 07-2010,saw neuro, thought to be a peripheral issue   . Dry skin   . GERD (gastroesophageal reflux disease)   . Hyperlipidemia   . Hypertension   . Hyperthyroidism   . Keloid    @ chest  . Memory loss   . Osteoarthritis   . Osteopenia    per dexa 12/09  . Paroxysmal atrial fibrillation ([place])    cards d/c coumadin 12/2008 d/t persisten NSR and frequent falls- restarted coumadin july 2011, now on Eliquis  . Recurrent UTI   .  Shortness of breath dyspnea   . TIA (transient ischemic attack)     Patient Active Problem List   Diagnosis Date Noted  . Acute kidney injury ([place]) 03/05/2016  . Diabetes mellitus with complication ([place])   . Exposure keratitis 09/23/2015  . Keratopathy, band 09/13/2015  . PCP NOTES >>>>> 04/05/2015  . Chronic diastolic heart failure ([place]) 12/29/2014  . Edema 12/20/2014  . DM type 2 (diabetes mellitus, type 2) ([place]) 12/20/2014  . Dyspnea 12/20/2014  . Chronic atrial fibrillation ([place]) 05/12/2014  . Angiectasia 03/30/2014  . GI bleed 03/28/2014  . Mild cognitive impairment 09/16/2013  . Annual physical exam 11/21/2011  . ATRIAL FIBRILLATION, chronic 03/05/2007  . Diabetes mellitus with circulatory complication ([place]) 76/19/5093  . S/P AVR (aortic valve replacement) 10/15/2006  . Thyrotoxicosis 09/03/2006  . Dyslipidemia 09/03/2006  . Essential hypertension 09/03/2006  . CAD (coronary artery disease) of artery bypass graft 09/03/2006  . Asthma, chronic 09/03/2006    Past Surgical History:  Procedure Laterality Date  . ABDOMINAL HYSTERECTOMY    . AORTIC VALVE REPLACEMENT  1998  . cagb  1998  . CAROTID DOPPLER  07/13/12   BILATERAL BULB/PROXIMAL ICAS;MILD AMOUNT OF FIBROUS PLAQUE WITH NO DIAMETER REDUCTION.  . CESAREAN SECTION     x 2  . COLONOSCOPY WITH PROPOFOL N/A 03/30/2014   Procedure: COLONOSCOPY WITH PROPOFOL;  Surgeon: Irene Shipper, MD;  Location: [facility];  Service: Endoscopy;  Laterality: N/A;  . CORONARY ARTERY BYPASS GRAFT  1998   SVG TO RCA  . HEMORRHOID SURGERY    . MYOCARDIAL PERFUSION STUDY  12/19/09   NORMAL PATTERN OF PERFUSION IN ALL REGIONS.EF 75%.  . OOPHORECTOMY    . TOTAL KNEE ARTHROPLASTY  1999  . TRANSTHORACIC ECHOCARDIOGRAM  09/11/11   LVEF >55%.STAGE 1 DIASTOLIC DYSFUNCTION,ELEVATED LV FILLING PRESSURE.BIOPROSTHETIC AORTIC VALVE-PEAK AND MEAN GRADIENTS OF 17 MMHG AND 8 MMHG.SIGMOID SEPTUM.MILD TO MOD MR.MILD TO MOD TR.RVSP 46 MMHG.    OB History     No data available       Home Medications    Prior to Admission medications   Medication Sig Start Date End Date Taking? Authorizing Provider  acetaminophen (TYLENOL) 500 MG tablet Take 1,000 mg by mouth every 6 (six) hours as needed for moderate pain (pain).    Historical Provider, MD  ALPRAZolam Duanne Moron) 0.5 MG tablet Take 1 tablet (0.5 mg total) by mouth at bedtime as needed for anxiety or sleep. 10/16/15   Colon Branch, MD  artificial tears (LACRILUBE) OINT ophthalmic ointment Place into both eyes at bedtime as needed for dry eyes. 09/23/15   Robyn Haber, MD  aspirin 81 MG tablet Take 81 mg by mouth daily.    Historical Provider, MD  atorvastatin (LIPITOR) 40 MG tablet Take 1 tablet (40 mg total) by mouth at bedtime. 07/18/16   Colon Branch, MD  benazepril (LOTENSIN) 20 MG tablet TAKE 1 TABLET BY MOUTH DAILY 08/15/16   Troy Sine, MD  budesonide (PULMICORT) 0.5 MG/2ML nebulizer solution USE 1 VIAL VIA NEBULIZER TWICE A DAY 06/06/16   Colon Branch, MD  Calcium Carbonate (CALCIUM 500 PO) Take 500 mg by mouth at bedtime.     Historical Provider, MD  cholecalciferol (VITAMIN D) 1000 UNITS tablet Take 1,000 Units by mouth at bedtime.     Historical Provider, MD  diltiazem (CARDIZEM CD) 120 MG 24 hr capsule Take 1 capsule (120 mg total) by mouth daily. 08/20/16 11/18/16  Troy Sine, MD  dorzolamide-timolol (COSOPT) 22.3-6.8 MG/ML ophthalmic solution Place 1 drop into both eyes 2 (two) times daily.  03/23/14   Historical Provider, MD  esomeprazole (NEXIUM) 40 MG capsule Take 1 capsule (40 mg total) by mouth daily before breakfast. 01/10/16   Colon Branch, MD  furosemide (LASIX) 20 MG tablet Take 2 tablets (40 mg total) by mouth daily. 08/14/16   Colon Branch, MD  levalbuterol Penne Lash) 0.63 MG/3ML nebulizer solution Take 3 mLs (0.63 mg total) by nebulization 2 (two) times daily. 11/10/15   Colon Branch, MD  magnesium oxide (MAG-OX) 400 (241.3 Mg) MG tablet Take 1 tablet (400 mg total) by mouth daily. 05/06/16    Colon Branch, MD  methimazole (TAPAZOLE) 5 MG tablet Take 1 tablet (5 mg total) by mouth 3 (three) times a week. 04/12/16   Colon Branch, MD  metoprolol tartrate (LOPRESSOR) 25 MG tablet Take 1 tablet (25 mg total) by mouth 2 (two) times daily. 05/14/16   Colon Branch, MD  nitroGLYCERIN (NITROSTAT) 0.4 MG SL tablet Place 0.4 mg under the tongue every 5 (five) minutes as needed for chest pain (chest pain). Reported on 11/10/2015    Historical Provider, MD  PAZEO 0.7 % SOLN  05/13/16   Historical Provider, MD  potassium chloride (KLOR-CON M10) 10 MEQ tablet Take 1 tablet (10 mEq total) by mouth daily.  05/06/16   Colon Branch, MD  RESTASIS MULTIDOSE 0.05 % ophthalmic emulsion  05/13/16   Historical Provider, MD  TRAVATAN Z 0.004 % SOLN ophthalmic solution Place 1 drop into both eyes at bedtime.  06/16/13   Historical Provider, MD    Family History Family History  Problem Relation Age of Onset  . Heart attack Father   . Coronary artery disease Son 73    deceased  . Hypertension Brother   . Stroke Brother   . Colon cancer Neg Hx   . Breast cancer Neg Hx   . Thyroid disease Neg Hx     Social History Social History  Substance Use Topics  . Smoking status: Former Research scientist (life sciences)  . Smokeless tobacco: Never Used     Comment: quit 2004, smoked 1.5 ppd  . Alcohol use No     Allergies   Hydrocodone and Tramadol hcl   Review of Systems Review of Systems  Neurological: Positive for speech difficulty.  All other systems reviewed and are negative.    Physical Exam Updated Vital Signs Wt 78.1 kg   BMI 31.49 kg/m   Physical Exam  Constitutional: She appears well-developed and well-nourished.  HENT:  Head: Normocephalic and atraumatic.  Mouth/Throat: Oropharynx is clear and moist.  Airway clear, upper dentures in place, no apparent stridor  Eyes: Conjunctivae and EOM are normal. Pupils are equal, round, and reactive to light.  Neck: Normal range of motion.  Cardiovascular: Normal rate, regular rhythm  and normal heart sounds.   Pulmonary/Chest: Effort normal and breath sounds normal.  Abdominal: Soft. Bowel sounds are normal.  Musculoskeletal: Normal range of motion.  Neurological: She is alert.  AAOx2, some confusion regarding date, answering questions and following commands appropriately; equal strength UE and LE bilaterally; CN grossly intact; moves all extremities appropriately without ataxia; no focal neuro deficits or facial asymmetry appreciated; able to speak here, able to identify objects and read sentences without difficulty  Skin: Skin is warm and dry.  Psychiatric: Her mood appears anxious.  Appears somewhat anxious and tearful, appears upset  Nursing note and vitals reviewed.    ED Treatments / Results  Labs (all labs ordered are listed, but only abnormal results are displayed) Labs Reviewed  PROTIME-INR - Abnormal; Notable for the following:       Result Value   Prothrombin Time 15.6 (*)    All other components within normal limits  CBC - Abnormal; Notable for the following:    MCV 101.2 (*)    All other components within normal limits  COMPREHENSIVE METABOLIC PANEL - Abnormal; Notable for the following:    Glucose, Bld 125 (*)    Albumin 3.2 (*)    GFR calc non Af Amer 57 (*)    All other components within normal limits  CBG MONITORING, ED - Abnormal; Notable for the following:    Glucose-Capillary 114 (*)    All other components within normal limits  I-STAT CHEM 8, ED - Abnormal; Notable for the following:    Glucose, Bld 124 (*)    Calcium, Ion 1.08 (*)    All other components within normal limits  APTT  DIFFERENTIAL  CBC  CREATININE, SERUM  MAGNESIUM  I-STAT TROPOININ, ED  CBG MONITORING, ED    EKG  EKG Interpretation None       Radiology Dg Chest 2 View  Result Date: 08/30/2016 CLINICAL DATA:  Shortness of breath.  Confusion. EXAM: CHEST  2 VIEW COMPARISON:  06/21/2016. FINDINGS: Prior CABG. Cardiac  valve replacement. Cardiomegaly. No focal  pulmonary infiltrate. No pleural effusion or pneumothorax. Degenerative changes thoracic spine. IMPRESSION: 1. Prior CABG.  Cardiac valve replacement.  Cardiomegaly. 2. No focal pulmonary infiltrate. Electronically Signed   By: Marcello Moores  Register   On: 08/30/2016 10:34   Ct Head Code Stroke W/o Cm  Result Date: 08/30/2016 CLINICAL DATA:  Code stroke.  Aphasia. EXAM: CT HEAD WITHOUT CONTRAST TECHNIQUE: Contiguous axial images were obtained from the base of the skull through the vertex without intravenous contrast. COMPARISON:  03/05/2016 FINDINGS: Brain: Chronic bilateral MCA territory infarcts do not appear significantly changed and involve the left insula, bilateral frontal lobes, and right parietal lobe. There is no definite evidence of new cortically based infarct, acute intracranial hemorrhage, mass, midline shift, or extra-axial fluid collection. There is mild global cerebral atrophy. Bilateral periventricular white matter hypodensities are similar to the prior study and nonspecific but compatible with moderate chronic small vessel ischemic disease. Vascular: Calcified atherosclerosis at the skullbase. No hyperdense vessel. Skull: No fracture or focal osseous lesion. Sinuses/Orbits: Paranasal sinuses and mastoid air cells are clear. Unremarkable orbits. Other: None. ASPECTS [facility] Stroke Program Early CT Score) - Ganglionic level infarction (caudate, lentiform nuclei, internal capsule, insula, M1-M3 cortex): 7 - Supraganglionic infarction (M4-M6 cortex): 3 Total score (0-10 with 10 being normal): 10 IMPRESSION: 1. No evidence of acute intracranial hemorrhage or definite acute infarct. 2. ASPECTS is 10. 3. Chronic bilateral MCA infarcts and moderate chronic small vessel ischemic disease. Results sent via text page to Dr. Shon Hale on 08/30/2016 at 9:50 a.m. Electronically Signed   By: Logan Bores M.D.   On: 08/30/2016 09:51    Procedures Procedures (including critical care time)  Medications Ordered in  ED Medications  diltiazem (CARDIZEM CD) 24 hr capsule 120 mg (not administered)  benazepril (LOTENSIN) tablet 20 mg (not administered)  furosemide (LASIX) tablet 40 mg (not administered)  atorvastatin (LIPITOR) tablet 40 mg (not administered)  budesonide (PULMICORT) nebulizer solution 0.5 mg (not administered)  cycloSPORINE (RESTASIS) 0.05 % ophthalmic emulsion 1 drop (not administered)  metoprolol tartrate (LOPRESSOR) tablet 25 mg (not administered)  potassium chloride (K-DUR,KLOR-CON) CR tablet 10 mEq (not administered)  methimazole (TAPAZOLE) tablet 5 mg (not administered)  levalbuterol (XOPENEX) nebulizer solution 0.63 mg (not administered)  artificial tears (LACRILUBE) ophthalmic ointment (not administered)  aspirin tablet 81 mg (not administered)  dorzolamide-timolol (COSOPT) 22.3-6.8 MG/ML ophthalmic solution 1 drop (not administered)  nitroGLYCERIN (NITROSTAT) SL tablet 0.4 mg (not administered)  latanoprost (XALATAN) 0.005 % ophthalmic solution 1 drop (not administered)  Calcium-Magnesium-Vitamin D 500-250-200 MG-MG-UNIT TABS (not administered)  cholecalciferol (VITAMIN D) tablet 1,000 Units (not administered)   stroke: mapping our early stages of recovery book (not administered)  heparin injection 5,000 Units (not administered)  sodium chloride flush (NS) 0.9 % injection 3 mL (not administered)  ondansetron (ZOFRAN) tablet 4 mg (not administered)    Or  ondansetron (ZOFRAN) injection 4 mg (not administered)  polyethylene glycol (MIRALAX / GLYCOLAX) packet 17 g (not administered)  traZODone (DESYREL) tablet 25 mg (not administered)  acetaminophen (TYLENOL) tablet 650 mg (not administered)    Or  acetaminophen (TYLENOL) suppository 650 mg (not administered)     Initial Impression / Assessment and Plan / ED Course  I have reviewed the triage vital signs and the nursing notes.  Pertinent labs & imaging results that were available during my care of the patient were reviewed  by me and considered in my medical decision making (see chart for details).  83 year old female presenting  here as a code stroke. Last known well 8 AM, daughter reports she was having difficulty speaking at that time. His symptoms seem to have resolved here, therefore not a TPA candidate. Patient does appear somewhat anxious and tearful. She has increased work of breathing, but lungs are overall clear and O2 sats are 100% on room air. This may be secondary to some anxiety. Screening lab work is overall reassuring. CT head without acute findings, evidence of old strokes noted. Chest x-ray was obtained and is negative for any acute cardiopulmonary process. Neurology has evaluated patient in the ED, recommend inpatient workup for TIA including MRI/MRA and EEG which have been ordered. Discussed with hospitalist service, will admit for ongoing care.  Final Clinical Impressions(s) / ED Diagnoses   Final diagnoses:  Stroke-like symptoms  Transient cerebral ischemia, unspecified type    New Prescriptions New Prescriptions   No medications on file     Larene Pickett, PA-C 08/30/16 [place], MD 09/02/16 [phone]

## 2016-08-30 NOTE — ED Notes (Signed)
Patient transported to MRI 

## 2016-08-30 NOTE — Progress Notes (Signed)
Spoke to RN and EEG will be completed this afternoon as schedule permits

## 2016-08-31 ENCOUNTER — Observation Stay (HOSPITAL_BASED_OUTPATIENT_CLINIC_OR_DEPARTMENT_OTHER): Payer: Medicare Other

## 2016-08-31 DIAGNOSIS — I482 Chronic atrial fibrillation: Secondary | ICD-10-CM

## 2016-08-31 DIAGNOSIS — I10 Essential (primary) hypertension: Secondary | ICD-10-CM | POA: Diagnosis not present

## 2016-08-31 DIAGNOSIS — G451 Carotid artery syndrome (hemispheric): Secondary | ICD-10-CM

## 2016-08-31 DIAGNOSIS — E785 Hyperlipidemia, unspecified: Secondary | ICD-10-CM

## 2016-08-31 LAB — CBC
HCT: 38.3 % (ref 36.0–46.0)
Hemoglobin: 12 g/dL (ref 12.0–15.0)
MCH: 31.6 pg (ref 26.0–34.0)
MCHC: 31.3 g/dL (ref 30.0–36.0)
MCV: 100.8 fL — ABNORMAL HIGH (ref 78.0–100.0)
PLATELETS: 196 10*3/uL (ref 150–400)
RBC: 3.8 MIL/uL — AB (ref 3.87–5.11)
RDW: 14.5 % (ref 11.5–15.5)
WBC: 5.9 10*3/uL (ref 4.0–10.5)

## 2016-08-31 LAB — LIPID PANEL
CHOL/HDL RATIO: 3 ratio
Cholesterol: 104 mg/dL (ref 0–200)
HDL: 35 mg/dL — ABNORMAL LOW (ref >40)
LDL CALC: 56 mg/dL (ref 0–99)
Triglycerides: 63 mg/dL (ref <150)
VLDL: 13 mg/dL (ref 0–40)

## 2016-08-31 LAB — BASIC METABOLIC PANEL
Anion gap: 7 (ref 5–15)
BUN: 11 mg/dL (ref 6–20)
CHLORIDE: 105 mmol/L (ref 101–111)
CO2: 29 mmol/L (ref 22–32)
CREATININE: 0.85 mg/dL (ref 0.44–1.00)
Calcium: 8.9 mg/dL (ref 8.9–10.3)
GFR, EST NON AFRICAN AMERICAN: 58 mL/min — AB (ref >60)
Glucose, Bld: 116 mg/dL — ABNORMAL HIGH (ref 65–99)
Potassium: 4 mmol/L (ref 3.5–5.1)
Sodium: 141 mmol/L (ref 135–145)

## 2016-08-31 MED ORDER — APIXABAN 5 MG PO TABS
5.0000 mg | ORAL_TABLET | Freq: Two times a day (BID) | ORAL | Status: DC
  Administered 2016-08-31: 5 mg via ORAL
  Filled 2016-08-31: qty 1

## 2016-08-31 MED ORDER — APIXABAN 5 MG PO TABS: 5.0000 mg | ORAL_TABLET | Freq: Two times a day (BID) | ORAL | 0 refills | Status: DC

## 2016-08-31 MED ORDER — MAGNESIUM OXIDE 400 (241.3 MG) MG PO TABS: 1.0000 | ORAL_TABLET | Freq: Every day | ORAL | Status: DC

## 2016-08-31 MED ORDER — POTASSIUM CHLORIDE CRYS ER 10 MEQ PO TBCR: 10.0000 meq | EXTENDED_RELEASE_TABLET | Freq: Every day | ORAL | Status: DC

## 2016-08-31 NOTE — Evaluation (Signed)
Speech Language Pathology Evaluation Patient Details Name: Rebecca Skinner MRN: [id number] DOB: [birth year] Today's Date: 08/31/2016 Time: 1025-8527 SLP Time Calculation (min) (ACUTE ONLY): 21 min  Problem List:  Patient Active Problem List   Diagnosis Date Noted  . TIA (transient ischemic attack) 08/30/2016  . Acute kidney injury ([place]) 03/05/2016  . Diabetes mellitus with complication ([place])   . Exposure keratitis 09/23/2015  . Keratopathy, band 09/13/2015  . PCP NOTES >>>>> 04/05/2015  . Chronic diastolic heart failure ([place]) 12/29/2014  . Edema 12/20/2014  . DM type 2 (diabetes mellitus, type 2) ([place]) 12/20/2014  . Dyspnea 12/20/2014  . Chronic atrial fibrillation ([place]) 05/12/2014  . Angiectasia 03/30/2014  . GI bleed 03/28/2014  . Mild cognitive impairment 09/16/2013  . Annual physical exam 11/21/2011  . ATRIAL FIBRILLATION, chronic 03/05/2007  . Diabetes mellitus with circulatory complication ([place]) 78/24/2353  . S/P AVR (aortic valve replacement) 10/15/2006  . Hyperthyroidism 09/03/2006  . Dyslipidemia 09/03/2006  . HTN (hypertension) 09/03/2006  . CAD (coronary artery disease) of artery bypass graft 09/03/2006  . Asthma, chronic 09/03/2006   Past Medical History:  Past Medical History:  Diagnosis Date  . Anxiety 11/20/2011  . Asthma    UNDER THE CARE OP[facility] DR PAZ  . CAD (coronary artery disease)    s/p CABG s/p AO valve replacement ([place] [facility])  . CVA (cerebral infarction) 08-2013   multiple, L, d/t Afib, started eloquis  . Diabetes mellitus    type 2   . Dizziness    Chronic, admiet 07-2010,saw neuro, thought to be a peripheral issue   . Dry skin   . GERD (gastroesophageal reflux disease)   . Hyperlipidemia   . Hypertension   . Hyperthyroidism   . Keloid    @ chest  . Memory loss   . Osteoarthritis   . Osteopenia    per dexa 12/09  . Paroxysmal atrial fibrillation ([place])    cards d/c coumadin 12/2008 d/t persisten NSR and frequent falls- restarted coumadin  july 2011, now on Eliquis  . Recurrent UTI   . Shortness of breath dyspnea   . TIA (transient ischemic attack)    Past Surgical History:  Past Surgical History:  Procedure Laterality Date  . ABDOMINAL HYSTERECTOMY    . AORTIC VALVE REPLACEMENT  1998  . CARDIAC VALVE REPLACEMENT    . CAROTID DOPPLER  07/13/12   BILATERAL BULB/PROXIMAL ICAS;MILD AMOUNT OF FIBROUS PLAQUE WITH NO DIAMETER REDUCTION.  . CESAREAN SECTION     x 2  . COLONOSCOPY WITH PROPOFOL N/A 03/30/2014   Procedure: COLONOSCOPY WITH PROPOFOL;  Surgeon: Irene Shipper, MD;  Location: [facility];  Service: Endoscopy;  Laterality: N/A;  . CORONARY ARTERY BYPASS GRAFT  1998   SVG TO RCA  . HEMORRHOID SURGERY    . JOINT REPLACEMENT    . MYOCARDIAL PERFUSION STUDY  12/19/09   NORMAL PATTERN OF PERFUSION IN ALL REGIONS.EF 75%.  . OOPHORECTOMY    . TOTAL KNEE ARTHROPLASTY  1999  . TRANSTHORACIC ECHOCARDIOGRAM  09/11/11   LVEF >55%.STAGE 1 DIASTOLIC DYSFUNCTION,ELEVATED LV FILLING PRESSURE.BIOPROSTHETIC AORTIC VALVE-PEAK AND MEAN GRADIENTS OF 17 MMHG AND 8 MMHG.SIGMOID SEPTUM.MILD TO MOD MR.MILD TO MOD TR.RVSP 46 MMHG.   HPI:  83 y.o. female admitted to [facility] on 08/30/16 for confusion and difficulty speaking. Stroke workup in progress. CT (-) for acute event, MRI 3/23 showed advanced atrophy with chronic microvascular ischemic change,   Assessment / Plan / Recommendation Clinical Impression  Patient presents with cognitive communication  deficits which are at baseline per pt's daughter, [name], whom she resides with. Presents with impairments in memory, orientation, attention, and functional problem solving. Given patient's current functioning at baseline and fact that she will have necessary level of assistance from her daughter at discharge, no further follow-up with SLP recommended at this time. SLP will s/o.     SLP Assessment  SLP Recommendation/Assessment: Patient does not need any further Speech Lanaguage Pathology Services SLP  Visit Diagnosis: Cognitive communication deficit (R41.841)    Follow Up Recommendations  None    Frequency and Duration           SLP Evaluation Cognition  Overall Cognitive Status: History of cognitive impairments - at baseline Arousal/Alertness: Awake/alert Orientation Level: Oriented to person;Oriented to place;Disoriented to time;Oriented to situation Attention: Focused;Selective;Sustained Focused Attention: Impaired Focused Attention Impairment: Verbal basic;Functional basic Sustained Attention: Impaired Sustained Attention Impairment: Verbal basic;Functional basic Selective Attention: Impaired Selective Attention Impairment: Verbal basic;Functional basic Memory: Impaired Memory Impairment: Storage deficit;Decreased short term memory;Decreased recall of new information Decreased Short Term Memory: Verbal basic;Functional basic Awareness: Impaired Awareness Impairment: Intellectual impairment Problem Solving: Impaired Problem Solving Impairment: Verbal basic;Functional basic Safety/Judgment: Impaired       Comprehension  Auditory Comprehension Overall Auditory Comprehension: Impaired at baseline Yes/No Questions: Within Functional Limits Commands: Impaired One Step Basic Commands: 75-100% accurate Two Step Basic Commands: 50-74% accurate Conversation: Simple Interfering Components: Attention;Processing speed EffectiveTechniques: Extra processing time;Repetition;Pausing Visual Recognition/Discrimination Discrimination: Not tested Reading Comprehension Reading Status: Not tested    Expression Expression Primary Mode of Expression: Verbal Verbal Expression Overall Verbal Expression: Appears within functional limits for tasks assessed Initiation: No impairment Automatic Speech: Name;Social Response Level of Generative/Spontaneous Verbalization: Conversation Repetition: No impairment Naming: No impairment Pragmatics: No impairment Non-Verbal Means of  Communication: Not applicable Written Expression Dominant Hand: Right Written Expression: Not tested   Oral / Motor  Oral Motor/Sensory Function Overall Oral Motor/Sensory Function: Within functional limits Motor Speech Overall Motor Speech: Appears within functional limits for tasks assessed Respiration: Within functional limits Phonation: Normal Resonance: Within functional limits Articulation: Within functional limitis Intelligibility: Intelligible Motor Planning: Witnin functional limits Motor Speech Errors: Not applicable (Pt has slight stutter; she reports she has had this "all my )   GO          Functional Assessment Tool Used: skilled clinical judgment Functional Limitations: Memory Memory Current Status (T6546): At least 40 percent but less than 60 percent impaired, limited or restricted Memory Goal Status (T0354): At least 40 percent but less than 60 percent impaired, limited or restricted Memory Discharge Status [phone]): At least 40 percent but less than 60 percent impaired, limited or restricted         DONNEISHA BEANE 08/31/2016, 10:18 AM  Deneise Lever, MS CF-SLP Speech-Language Pathologist [phone]

## 2016-08-31 NOTE — Progress Notes (Signed)
ANTICOAGULATION CONSULT NOTE - Initial Consult  Pharmacy Consult for Apixban Indication: atrial fibrillation and TIA  Allergies  Allergen Reactions  . Hydrocodone Itching and Nausea Only  . Tramadol Hcl Itching and Nausea Only    Patient Measurements: Height: 5\' 2"  (157.5 cm) Weight: 172 lb 2.9 oz (78.1 kg) IBW/kg (Calculated) : 50.1  Vital Signs: Temp: 97.8 F (36.6 C) (03/24 1503) Temp Source: Oral (03/24 1503) BP: 157/56 (03/24 1503) Pulse Rate: 64 (03/24 1503)  Labs:  Recent Labs  08/30/16 0926 08/30/16 0931 08/31/16 0700  HGB 13.5 13.6 12.0  HCT 41.8 40.0 38.3  PLT 173  --  196  APTT 33  --   --   LABPROT 15.6*  --   --   INR 1.24  --   --   CREATININE 0.87 0.80 0.85    Estimated Creatinine Clearance: 41.7 mL/min (by C-G formula based on SCr of 0.85 mg/dL).   Medical History: Past Medical History:  Diagnosis Date  . Anxiety 11/20/2011  . Asthma    UNDER THE CARE OPF DR PAZ  . CAD (coronary artery disease)    s/p CABG s/p AO valve replacement ([place] [facility])  . CVA (cerebral infarction) 08-2013   multiple, L, d/t Afib, started eloquis  . Diabetes mellitus    type 2   . Dizziness    Chronic, admiet 07-2010,saw neuro, thought to be a peripheral issue   . Dry skin   . GERD (gastroesophageal reflux disease)   . Hyperlipidemia   . Hypertension   . Hyperthyroidism   . Keloid    @ chest  . Memory loss   . Osteoarthritis   . Osteopenia    per dexa 12/09  . Paroxysmal atrial fibrillation ([place])    cards d/c coumadin 12/2008 d/t persisten NSR and frequent falls- restarted coumadin july 2011, now on Eliquis  . Recurrent UTI   . Shortness of breath dyspnea   . TIA (transient ischemic attack)    Assessment: 89yof to begin apixaban for afib and TIA. She only meets one criteria (age > 35) so dose will be 5mg  bid.  Goal of Therapy:  Monitor platelets by anticoagulation protocol: Yes   Plan:  1) Apixaban 5mg  po bid   Deboraha Sprang 08/31/2016,3:22 PM

## 2016-08-31 NOTE — Discharge Summary (Signed)
Physician Discharge Summary  Rebecca Skinner BZJ:[id number] DOB: [birth year]  PCP: Kathlene November, MD  Admit date: 08/30/2016 Discharge date: 08/31/2016  Recommendations for Outpatient Follow-up:  1. Dr. Kathlene November, PCP in 3 days. 2. Dr. Rosalin Hawking, Neurology in 8 weeks.  Home Health: None Equipment/Devices: None    Discharge Condition: Improved and stable  CODE STATUS: Full  Diet recommendation: Heart healthy diet  Discharge Diagnoses:  Principal Problem:   TIA (transient ischemic attack) Active Problems:   Hyperthyroidism   HTN (hypertension)   ATRIAL FIBRILLATION, chronic   Asthma, chronic   Chronic atrial fibrillation [facility])   Brief Summary: 83 year old female, lives with her daughter, ambulates with the help of a walker, PMH of CAD, old stroke, A. 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 As per daughter, patient appeared confused and unable to speak. These deficits resolved within an hour by the time patient arrived to the ED.  Assessment and plan:  TIA due to afib not on [facility]   Resultant deficit resolved  MRI - No acute infarct is evident.   MRA - Moderate to severe stenosis at the origin of the left MCA anterior M2 branch.   CT Head - Chronic bilateral MCA infarcts.  Carotid Doppler - 1-39% ICA stenosis. Vertebral artery flow is antegrade.   2D Echo - 08/06/2016 - EF 65-70%. Atrial fibrillation noted but no other cardiac source of emboli.  LDL - 56  HgbA1c - pending and can be followed as outpatient.  aspirin 81 mg daily prior to admission. I and Dr. Erlinda Hong discussed in detail with daughter about anticoagulation and daughter would like to start with eliquis and she will discuss with Dr. Larose Kells and Dr. Claiborne Billings on Monday. She indicated that her rectal  bleed had been addressed by endoscopy treatment. 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 Daughter understands the risks versus benefits of starting versus holding anticoagulation.  Patient counseled to be compliant with her antithrombotic medications  Ongoing aggressive stroke risk factor management  Therapy recommendations:  No needs identified  Afib not on AC  On cardizem, rate controlled.  Please see above discussion regarding anticoagulation.  CHA2-DS2-VASc is 7 which estimates approximately risk of stroke at 11.2%  Hypertension  Blood pressure somewhat high at times              Permissive hypertension (OK if < 220/120) but gradually normalize in 5-7 days              Long-term BP goal normotensive  Hyperlipidemia  Home meds:  Lipitor 40 mg daily resumed in hospital  LDL 56, goal < 70  Continue lipitor on discharge.  Diabetes  HgbA1c 6.9, goal < 7.0  Hyperthyroid - Clinically euthyroid. Continue Tapazole. TSH normal.  Asthma - Stable without symptoms.  Consultations:  Neurology  Procedures:  As indicated above.   Discharge Instructions  Discharge Instructions    Ambulatory referral to Neurology    Complete by:  As directed    An appointment is requested in approximately: 8 weeks   Call MD for:    Complete by:  As directed    Strokelike symptoms.   Diet - low sodium heart healthy  Complete by:  As directed    Increase activity slowly    Complete by:  As directed        Medication List    STOP taking these medications   aspirin 81 MG tablet     TAKE these medications   acetaminophen 500 MG tablet Commonly known as:  TYLENOL Take 1,000 mg by mouth every 6 (six) hours as needed for moderate pain (pain).   apixaban 5 MG Tabs tablet Commonly known as:  ELIQUIS Take 1 tablet (5 mg total) by mouth 2 (two) times daily.   artificial tears Oint  ophthalmic ointment Place into both eyes at bedtime as needed for dry eyes.   atorvastatin 40 MG tablet Commonly known as:  LIPITOR Take 1 tablet (40 mg total) by mouth at bedtime.   benazepril 20 MG tablet Commonly known as:  LOTENSIN TAKE 1 TABLET BY MOUTH DAILY   budesonide 0.5 MG/2ML nebulizer solution Commonly known as:  PULMICORT USE 1 VIAL VIA NEBULIZER TWICE A DAY   CALCIUM 500 PO Take 500 mg by mouth at bedtime.   cholecalciferol 1000 units tablet Commonly known as:  VITAMIN D Take 1,000 Units by mouth at bedtime.   diltiazem 120 MG 24 hr capsule Commonly known as:  CARDIZEM CD Take 1 capsule (120 mg total) by mouth daily.   dorzolamide-timolol 22.3-6.8 MG/ML ophthalmic solution Commonly known as:  COSOPT Place 1 drop into both eyes 2 (two) times daily.   furosemide 20 MG tablet Commonly known as:  LASIX Take 2 tablets (40 mg total) by mouth daily.   levalbuterol 0.63 MG/3ML nebulizer solution Commonly known as:  XOPENEX Take 3 mLs (0.63 mg total) by nebulization 2 (two) times daily.   magnesium oxide 400 (241.3 Mg) MG tablet Commonly known as:  MAG-OX Take 1 tablet (400 mg total) by mouth at bedtime.   methimazole 5 MG tablet Commonly known as:  TAPAZOLE Take 1 tablet (5 mg total) by mouth 3 (three) times a week. What changed:  when to take this   metoprolol tartrate 25 MG tablet Commonly known as:  LOPRESSOR Take 1 tablet (25 mg total) by mouth 2 (two) times daily.   nitroGLYCERIN 0.4 MG SL tablet Commonly known as:  NITROSTAT Place 0.4 mg under the tongue every 5 (five) minutes as needed for chest pain (chest pain). Reported on 11/10/2015   potassium chloride 10 MEQ tablet Commonly known as:  KLOR-CON M10 Take 1 tablet (10 mEq total) by mouth at bedtime.   RESTASIS MULTIDOSE 0.05 % ophthalmic emulsion Generic drug:  cycloSPORINE Place 1 drop into both eyes daily.   TRAVATAN Z 0.004 % Soln ophthalmic solution Generic drug:  Travoprost (BAK  Free) Place 1 drop into both eyes at bedtime.      Pole Ojea, MD. Schedule an appointment as soon as possible for a visit in 3 day(s).   Specialty:  Internal Medicine Contact information: [address] [place] [address] [phone]        Dennie Bible, NP. Schedule an appointment as soon as possible for a visit in 6 week(s).   Specialty:  Family Medicine Contact information: [address] [place] [address] [phone]          Allergies  Allergen Reactions  . Hydrocodone Itching and Nausea Only  . Tramadol Hcl Itching and Nausea Only      Procedures/Studies: Dg Chest 2 View  Result Date: 08/30/2016 CLINICAL  DATA:  Shortness of breath.  Confusion. EXAM: CHEST  2 VIEW COMPARISON:  06/21/2016. FINDINGS: Prior CABG. Cardiac valve replacement. Cardiomegaly. No focal pulmonary infiltrate. No pleural effusion or pneumothorax. Degenerative changes thoracic spine. IMPRESSION: 1. Prior CABG.  Cardiac valve replacement.  Cardiomegaly. 2. No focal pulmonary infiltrate. Electronically Signed   By: Marcello Moores  Register   On: 08/30/2016 10:34   Mr Jodene Nam Head Wo Contrast  Result Date: 08/30/2016 CLINICAL DATA:  83 year old female had episode of not being able to speak with LEFT-sided weakness, now resolved. EXAM: MRA HEAD WITHOUT CONTRAST TECHNIQUE: Angiographic images of the Circle of Willis were obtained using MRA technique without intravenous contrast. COMPARISON:  Brain MRI without contrast at 1320 hours today, reported separately. Intracranial MRA 06/29/2010 FINDINGS: Antegrade flow in the distal vertebral arteries appears stable, the right is dominant and the left may be chronically stenotic with similar decreased intensity of flow signal to the 2012 exam. The vertebrobasilar junction remains patent. No basilar artery stenosis. SCA and PCA origins remain normal. AICA origins are patent. Posterior communicating arteries  are diminutive or absent. Bilateral PCA branches are within normal limits. Antegrade flow in the distal cervical ICAs in both ICA siphons. Bilateral siphon irregularity and low-density in keeping with calcified atherosclerosis. Bilateral anterior genu level stenosis re- demonstrated and is up to moderate on the left and severe on the right. This appears mildly progressed since 2012. Both carotid termini remain patent. MCA and ACA origins remain normal. Anterior communicating artery and visible ACA branches are within normal limits. Bilateral MCA M1 segments are patent and normal. The right MCA bifurcation and visible right MCA branches are within normal limits. The left MCA bifurcation is patent. There is moderate to severe stenosis at the origin of the anterior left M2 branch (series 503, image 79) which has progressed since 2012. Otherwise the visible left MCA branches are within normal limits. IMPRESSION: 1. MRI findings today are reported separately. 2. Chronic moderate to severe bilateral ICA siphon atherosclerosis and anterior genu stenosis is worse on the right and has mildly progressed since 2012. 3. Moderate to severe stenosis at the origin of the left MCA anterior M2 branch. 4. Otherwise negative anterior circulation. 5. Chronic distal left vertebral artery stenosis is stable since 2012, and the distal left vertebral remains patent. 6. Otherwise negative posterior circulation. Electronically Signed   By: Genevie Ann M.D.   On: 08/30/2016 21:26   Mr Brain Wo Contrast  Result Date: 08/30/2016 CLINICAL DATA:  Patient had episode of not being able to speak with LEFT-sided weakness, now resolved. EXAM: MRI HEAD WITHOUT CONTRAST TECHNIQUE: Multiplanar, multiecho pulse sequences of the brain and surrounding structures were obtained without intravenous contrast. COMPARISON:  CT head earlier today. MR head 08/27/2013. MR head 06/29/2010. FINDINGS: The patient was unable to remain motionless for the exam. Small or  subtle lesions could be overlooked. Some images could not be completed. Furthermore MRA exam could not be completed. Brain: Advanced atrophy.  Chronic microvascular ischemic change. No acute stroke, acute hemorrhage, mass lesion, or extra-axial fluid. Hydrocephalus ex vacuo. Widespread areas of cortical infarction, with gliosis and encephalomalacia, progressive from 2015. For instance the LEFT frontal infarct which was acute at that time now displays only atrophy and increased T2 signal. Similar findings in the RIGHT posterior frontal cortex and RIGHT parietal cortex. Lacunar infarcts/ chronic ischemic demyelination affects the central upper pons bilaterally. Vascular: Flow voids are maintained. Tiny focus of chronic hemorrhage, RIGHT tectum similar to priors. Skull and upper cervical  spine: Normal marrow signal. Sinuses/Orbits: Negative. Other: None. IMPRESSION: Advanced atrophy with chronic microvascular ischemic change. Widespread areas of remote ischemia. No acute infarct is evident. Electronically Signed   By: Staci Righter M.D.   On: 08/30/2016 14:29   Ct Head Code Stroke W/o Cm  Result Date: 08/30/2016 CLINICAL DATA:  Code stroke.  Aphasia. EXAM: CT HEAD WITHOUT CONTRAST TECHNIQUE: Contiguous axial images were obtained from the base of the skull through the vertex without intravenous contrast. COMPARISON:  03/05/2016 FINDINGS: Brain: Chronic bilateral MCA territory infarcts do not appear significantly changed and involve the left insula, bilateral frontal lobes, and right parietal lobe. There is no definite evidence of new cortically based infarct, acute intracranial hemorrhage, mass, midline shift, or extra-axial fluid collection. There is mild global cerebral atrophy. Bilateral periventricular white matter hypodensities are similar to the prior study and nonspecific but compatible with moderate chronic small vessel ischemic disease. Vascular: Calcified atherosclerosis at the skullbase. No hyperdense  vessel. Skull: No fracture or focal osseous lesion. Sinuses/Orbits: Paranasal sinuses and mastoid air cells are clear. Unremarkable orbits. Other: None. ASPECTS [facility] Stroke Program Early CT Score) - Ganglionic level infarction (caudate, lentiform nuclei, internal capsule, insula, M1-M3 cortex): 7 - Supraganglionic infarction (M4-M6 cortex): 3 Total score (0-10 with 10 being normal): 10 IMPRESSION: 1. No evidence of acute intracranial hemorrhage or definite acute infarct. 2. ASPECTS is 10. 3. Chronic bilateral MCA infarcts and moderate chronic small vessel ischemic disease. Results sent via text page to Dr. Shon Hale on 08/30/2016 at 9:50 a.m. Electronically Signed   By: Logan Bores M.D.   On: 08/30/2016 09:51      Subjective: Patient denies complaints. As per daughter at bedside, patient is back to baseline in all aspects without any serial strokelike symptoms and no recurrence since yesterday.  Discharge Exam:  Vitals:   08/31/16 0539 08/31/16 0932 08/31/16 1127 08/31/16 1503  BP: (!) 189/73  135/61 (!) 157/56  Pulse: 84  76 64  Resp: 18  20 20   Temp: 98.6 F (37 C)  97.7 F (36.5 C) 97.8 F (36.6 C)  TempSrc: Oral  Oral Oral  SpO2: 100% 100% 98% 99%  Weight:      Height:        General: Pleasant elderly female sitting up comfortably in chair this morning. Cardiovascular: S1 & S2 heard, RRR, S1/S2 +. No murmurs, rubs, gallops or clicks. No JVD or pedal edema. Telemetry: A. fib with controlled ventricular rate and occasional PVCs. Respiratory: Clear to auscultation without wheezing, rhonchi or crackles. No increased work of breathing. Abdominal:  Non distended, non tender & soft. No organomegaly or masses appreciated. Normal bowel sounds heard. CNS: Alert and oriented. No focal deficits. Extremities: no edema, no cyanosis    The results of significant diagnostics from this hospitalization (including imaging, microbiology, ancillary and laboratory) are listed below for reference.      Microbiology: No results found for this or any previous visit (from the past 240 hour(s)).   Labs: CBC:  Recent Labs Lab 08/30/16 0926 08/30/16 0931 08/31/16 0700  WBC 6.8  --  5.9  NEUTROABS 4.1  --   --   HGB 13.5 13.6 12.0  HCT 41.8 40.0 38.3  MCV 101.2*  --  100.8*  PLT 173  --  160   Basic Metabolic Panel:  Recent Labs Lab 08/30/16 0926 08/30/16 0931 08/31/16 0700  NA 140 141 141  K 4.3 4.3 4.0  CL 105 102 105  CO2 26  --  29  GLUCOSE 125* 124* 116*  BUN 13 18 11   CREATININE 0.87 0.80 0.85  CALCIUM 8.9  --  8.9  MG 2.0  --   --    Liver Function Tests:  Recent Labs Lab 08/30/16 0926  AST 31  ALT 26  ALKPHOS 82  BILITOT 0.7  PROT 6.6  ALBUMIN 3.2*   CBG:  Recent Labs Lab 08/30/16 0924  GLUCAP 114*   Lipid Profile  Recent Labs  08/31/16 0700  CHOL 104  HDL 35*  LDLCALC 56  TRIG 63  CHOLHDL 3.0   Discussed in detail with patient's daughter. Updated care and answered questions.    Time coordinating discharge: Over 30 minutes  SIGNED:  Vernell Leep, MD, FACP, [place]. [facility] Pager [phone]  If 7PM-7AM, please contact night-coverage [email or web address] Password [facility] 08/31/2016, 4:25 PM

## 2016-08-31 NOTE — Progress Notes (Signed)
OT Cancellation Note and Discharge  Patient Details Name: MILAGROS MIDDENDORF MRN: [id number] DOB: [birth year]   Cancelled Treatment:    Reason Eval/Treat Not Completed: OT screened, no needs identified, will sign off. Spoke with SLP who recently performed evaluation and spoke with daughter who is caregiver. Pt is at baseline and will have adequate support upon discharge. OT to sign off at this time. Thank you for the referral.   Jaci Carrel 08/31/2016, 11:19 AM  Hulda Humphrey OTR/L [phone]

## 2016-08-31 NOTE — Progress Notes (Signed)
STROKE TEAM PROGRESS NOTE   HISTORY OF PRESENT ILLNESS (per record) ZARI CLY is a 83 y.o. female who has Afib but is on ASA due to hx of GI bleed. Today she awoke at 0630 and was normal. Had a bath and told her daughters she felt cold.  At 0800 family noted she was not talking and had a hard time recognizing family. She seemed to have some UE weakness on the left side. In route she was not talking and would nod her head she had a hard time finding her words. While in CT scanner she was talking clearly and answering all questions. She has no HA or difficulty with following instructions. CT head showed no bleed or acute stroke but did show multiple old bilateral strokes.  Known history of memory decline but not diagnosed with dementia  Date last known well: Date: 08/30/2016 Time last known well: Time: 08:00 tPA Given: No: symptoms resolved   SUBJECTIVE (INTERVAL HISTORY) No family is at the bedside. I called daughter over the phone. She stated that pt is not on eliquis due to frequent falls in the past. However, daughter said she has not had a fall since 03/2016.    OBJECTIVE Temp:  [97.7 F (36.5 C)-98.6 F (37 C)] 97.7 F (36.5 C) (03/24 1127) Pulse Rate:  [58-88] 76 (03/24 1127) Cardiac Rhythm: Atrial fibrillation (03/24 0700) Resp:  [16-24] 20 (03/24 1127) BP: (135-189)/(61-153) 135/61 (03/24 1127) SpO2:  [97 %-100 %] 98 % (03/24 1127)  CBC:   Recent Labs Lab 08/30/16 0926 08/30/16 0931 08/31/16 0700  WBC 6.8  --  5.9  NEUTROABS 4.1  --   --   HGB 13.5 13.6 12.0  HCT 41.8 40.0 38.3  MCV 101.2*  --  100.8*  PLT 173  --  629    Basic Metabolic Panel:   Recent Labs Lab 08/30/16 0926 08/30/16 0931 08/31/16 0700  NA 140 141 141  K 4.3 4.3 4.0  CL 105 102 105  CO2 26  --  29  GLUCOSE 125* 124* 116*  BUN 13 18 11   CREATININE 0.87 0.80 0.85  CALCIUM 8.9  --  8.9  MG 2.0  --   --     Lipid Panel:     Component Value Date/Time   CHOL 104 08/31/2016 0700    TRIG 63 08/31/2016 0700   HDL 35 (L) 08/31/2016 0700   CHOLHDL 3.0 08/31/2016 0700   VLDL 13 08/31/2016 0700   LDLCALC 56 08/31/2016 0700   HgbA1c:  Lab Results  Component Value Date   HGBA1C 6.9 (H) 08/28/2016   Urine Drug Screen:     Component Value Date/Time   LABOPIA NONE DETECTED 03/05/2016 1557   COCAINSCRNUR NONE DETECTED 03/05/2016 1557   LABBENZ NONE DETECTED 03/05/2016 1557   AMPHETMU NONE DETECTED 03/05/2016 1557   THCU NONE DETECTED 03/05/2016 1557   LABBARB NONE DETECTED 03/05/2016 1557      IMAGING I have personally reviewed the radiological images below and agree with the radiology interpretations.  Dg Chest 2 View 08/30/2016 1. Prior CABG.  Cardiac valve replacement.  Cardiomegaly.  2. No focal pulmonary infiltrate.   Mr Jodene Nam Head Wo Contrast 08/30/2016 2. Chronic moderate to severe bilateral ICA siphon atherosclerosis and anterior genu stenosis is worse on the right and has mildly progressed since 2012.  3. Moderate to severe stenosis at the origin of the left MCA anterior M2 branch.  4. Otherwise negative anterior circulation.  5. Chronic distal left  vertebral artery stenosis is stable since 2012, and the distal left vertebral remains patent.  6. Otherwise negative posterior circulation.   Mr Brain Wo Contrast 08/30/2016 Advanced atrophy with chronic microvascular ischemic change. Widespread areas of remote ischemia.  No acute infarct is evident.   Ct Head Code Stroke W/o Cm 08/30/2016 1. No evidence of acute intracranial hemorrhage or definite acute infarct.  2. ASPECTS is 10.  3. Chronic bilateral MCA infarcts and moderate chronic small vessel ischemic disease.   CUS - 1-39% ICA stenosis. Vertebral artery flow is antegrade.   TTE 08/06/16 -  Left ventricle: The cavity size was normal. There was mild   concentric hypertrophy. Systolic function was vigorous. The   estimated ejection fraction was in the range of 65% to 70%. Wall   motion was normal;  there were no regional wall motion   abnormalities. The study is not technically sufficient to allow   evaluation of LV diastolic function. - Aortic valve: A bioprosthesis was present and functioning   normally. Mean gradient (S): 6 mm Hg. Valve area (VTI): 0.81   cm^2. Valve area (Vmax): 0.72 cm^2. Valve area (Vmean): 0.76   cm^2. - Mitral valve: Transvalvular velocity was within the normal range.   There was no evidence for stenosis. There was mild regurgitation. - Left atrium: The atrium was moderately dilated. - Right ventricle: The cavity size was normal. Wall thickness was   normal. Systolic function was normal. - Right atrium: The atrium was mildly dilated. - Atrial septum: No defect or patent foramen ovale was identified   by color flow Doppler. - Tricuspid valve: There was severe regurgitation. A diagnosis of   severe regurgitation is supported by hepatic vein systolic flow   reversal. - Pulmonic valve: There was moderate regurgitation. - Pulmonary arteries: Systolic pressure was moderately increased.   PA peak pressure: 51 mm Hg (S).   PHYSICAL EXAM  Temp:  [97.7 F (36.5 C)-98.6 F (37 C)] 97.8 F (36.6 C) (03/24 1503) Pulse Rate:  [61-88] 64 (03/24 1503) Resp:  [16-20] 20 (03/24 1503) BP: (135-189)/(56-80) 157/56 (03/24 1503) SpO2:  [98 %-100 %] 99 % (03/24 1503)  General - Well nourished, well developed, in no apparent distress.  Ophthalmologic - Fundi not visualized due to noncooperation.  Cardiovascular - irregularly irregular heart rate and rhythm.  Mental Status -  Level of arousal and orientation to place and person were intact, not orientated to time. Language including expression, naming, repetition, comprehension was assessed and found intact. Fund of Knowledge was assessed and was impaired.  Cranial Nerves II - XII - II - blinking to visual threat bilaterally. III, IV, VI - Extraocular movements intact. V - Facial sensation intact  bilaterally. VII - Facial movement intact bilaterally. VIII - Hearing & vestibular intact bilaterally. X - Palate elevates symmetrically. XI - Chin turning & shoulder shrug intact bilaterally. XII - Tongue protrusion intact.  Motor Strength - The patient's strength was normal in all extremities and pronator drift was absent.  Bulk was normal and fasciculations were absent.   Motor Tone - Muscle tone was assessed at the neck and appendages and was normal.  Reflexes - The patient's reflexes were 1+ in all extremities and she had no pathological reflexes.  Sensory - Light touch, temperature/pinprick were assessed and were symmetrical.    Coordination - The patient had normal movements in the hands with no ataxia or dysmetria.  Tremor was absent.  Gait and Station - deferred   ASSESSMENT/PLAN Ms. Daine  E Emigh is a 83 y.o. 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 She did not receive IV t-PA due to resolution of deficits.  TIA due to afib not on [facility]   Resultant deficit resolved  MRI - No acute infarct is evident.   MRA - Moderate to severe stenosis at the origin of the left MCA anterior M2 branch.   CT Head - Chronic bilateral MCA infarcts.  Carotid Doppler - 1-39% ICA stenosis. Vertebral artery flow is antegrade.   2D Echo - 08/06/2016 - EF 65-70%. Atrial fibrillation noted but no other cardiac source of emboli.  LDL - 56  HgbA1c - pending  VTE prophylaxis - subcutaneous heparin Diet Heart Room service appropriate? Yes; Fluid consistency: Thin  aspirin 81 mg daily prior to admission, now on aspirin 325 mg daily. Discussed with daughter about anticoagulation and daughter would like to start with eliquis and she will discuss with Dr. Larose Kells  and Dr. Claiborne Billings on Monday.   Patient counseled to be compliant with her antithrombotic medications  Ongoing aggressive stroke risk factor management  Therapy recommendations:  No needs identified  Disposition:  Pending  afib not on AC  Pt was on coumadin but d/c due to multiple falls in 2009 and 2010  She was later on eliquis  She had GIB with anemia requiring PRBC in 2015, her ASA and eliquis stopped. 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 ASA resumed but not eliquis  Discussed with daughter this time, benefit and risk reviewed with daughter and she would like to start with eliquis for stroke / TIA prevention, and she will discuss with Dr. Larose Kells (PCP) and Dr. Claiborne Billings on Monday.   On cardizem, rate controlled.  Hypertension  Blood pressure somewhat high at times  Permissive hypertension (OK if < 220/120) but gradually normalize in 5-7 days  Long-term BP goal normotensive  Hyperlipidemia  Home meds:  Lipitor 40 mg daily resumed in hospital  LDL 56, goal < 70  Continue lipitor on discharge.  Diabetes  HgbA1c pending, goal < 7.0  CBG monitoring showed glucose under controlled  Follow up with Dr. Larose Kells  Other Stroke Risk Factors  Advanced age  Former smoker  Hx of stroke/TIA - on MRI  Family hx stroke (brother)  CAD s/p CABG and AVR  Other Active Problems  Hx of GIB  Hospital day # 0  Neurology will sign off. Please call with questions. Pt will follow up with carolyn Hassell Done NP at [facility] in about 6 weeks. Thanks for the consult.  Rosalin Hawking, MD PhD Stroke Neurology 08/31/2016 4:06 PM   To contact Stroke Continuity provider, please refer to [email or web address] After hours, contact General Neurology

## 2016-08-31 NOTE — Discharge Instructions (Addendum)
Information on my medicine - ELIQUIS (apixaban)  This medication education was reviewed with me or my healthcare representative as part of my discharge preparation.  The pharmacist that spoke with me during my hospital stay was:  Deboraha Sprang, [facility]  Why was Eliquis prescribed for you? Eliquis was prescribed for you to reduce the risk of a blood clot forming that can cause a stroke if you have a medical condition called atrial fibrillation (a type of irregular heartbeat).  What do You need to know about Eliquis ? Take your Eliquis TWICE DAILY - one tablet in the morning and one tablet in the evening with or without food. If you have difficulty swallowing the tablet whole please discuss with your pharmacist how to take the medication safely.  Take Eliquis exactly as prescribed by your doctor and DO NOT stop taking Eliquis without talking to the doctor who prescribed the medication.  Stopping may increase your risk of developing a stroke.  Refill your prescription before you run out.  After discharge, you should have regular check-up appointments with your healthcare provider that is prescribing your Eliquis.  In the future your dose may need to be changed if your kidney function or weight changes by a significant amount or as you get older.  What do you do if you miss a dose? If you miss a dose, take it as soon as you remember on the same day and resume taking twice daily.  Do not take more than one dose of ELIQUIS at the same time to make up a missed dose.  Important Safety Information A possible side effect of Eliquis is bleeding. You should call your healthcare provider right away if you experience any of the following: ? Bleeding from an injury or your nose that does not stop. ? Unusual colored urine (red or dark brown) or unusual colored stools (red or black). ? Unusual bruising for unknown reasons. ? A serious fall or if you hit your head (even if there is no  bleeding).  Some medicines may interact with Eliquis and might increase your risk of bleeding or clotting while on Eliquis. To help avoid this, consult your healthcare provider or pharmacist prior to using any new prescription or non-prescription medications, including herbals, vitamins, non-steroidal anti-inflammatory drugs (NSAIDs) and supplements.  This website has more information on Eliquis (apixaban): http://[email or web address].com/eliquis/home   Additional discharge instructions:  Please get your medications reviewed and adjusted by your Primary MD.  Please request your Primary MD to go over all Hospital Tests and Procedure/Radiological results at the follow up, please get all Hospital records sent to your Prim MD by signing hospital release before you go home.  If you had Pneumonia of Lung problems at the Hospital: Please get a 2 view Chest X ray done in 6-8 weeks after hospital discharge or sooner if instructed by your Primary MD.  If you have Congestive Heart Failure: Please call your Cardiologist or Primary MD anytime you have any of the following symptoms:  1) 3 pound weight gain in 24 hours or 5 pounds in 1 week  2) shortness of breath, with or without a dry hacking cough  3) swelling in the hands, feet or stomach  4) if you have to sleep on extra pillows at night in order to breathe  Follow cardiac low salt diet and 1.5 lit/day fluid restriction.  If you have diabetes Accuchecks 4 times/day, Once in AM empty stomach and then before each meal. Log in all results and  show them to your primary doctor at your next visit. If any glucose reading is under 80 or above 300 call your primary MD immediately.  If you have Seizure/Convulsions/Epilepsy: Please do not drive, operate heavy machinery, participate in activities at heights or participate in high speed sports until you have seen by Primary MD or a Neurologist and advised to do so again.  If you had Gastrointestinal  Bleeding: Please ask your Primary MD to check a complete blood count within one week of discharge or at your next visit. Your endoscopic/colonoscopic biopsies that are pending at the time of discharge, will also need to followed by your Primary MD.  Get Medicines reviewed and adjusted. Please take all your medications with you for your next visit with your Primary MD  Please request your Primary MD to go over all hospital tests and procedure/radiological results at the follow up, please ask your Primary MD to get all Hospital records sent to his/her office.  If you experience worsening of your admission symptoms, develop shortness of breath, life threatening emergency, suicidal or homicidal thoughts you must seek medical attention immediately by calling 911 or calling your MD immediately  if symptoms less severe.  You must read complete instructions/literature along with all the possible adverse reactions/side effects for all the Medicines you take and that have been prescribed to you. Take any new Medicines after you have completely understood and accpet all the possible adverse reactions/side effects.   Do not drive or operate heavy machinery when taking Pain medications.   Do not take more than prescribed Pain, Sleep and Anxiety Medications  Special Instructions: If you have smoked or chewed Tobacco  in the last 2 yrs please stop smoking, stop any regular Alcohol  and or any Recreational drug use.  Wear Seat belts while driving.  Please note You were cared for by a hospitalist during your hospital stay. If you have any questions about your discharge medications or the care you received while you were in the hospital after you are discharged, you can call the unit and asked to speak with the hospitalist on call if the hospitalist that took care of you is not available. Once you are discharged, your primary care physician will handle any further medical issues. Please note that NO REFILLS for  any discharge medications will be authorized once you are discharged, as it is imperative that you return to your primary care physician (or establish a relationship with a primary care physician if you do not have one) for your aftercare needs so that they can reassess your need for medications and monitor your lab values.  You can reach the hospitalist office at phone [phone] or fax [phone]   If you do not have a primary care physician, you can call [phone] for a physician referral.

## 2016-08-31 NOTE — Progress Notes (Signed)
VASCULAR LAB PRELIMINARY  PRELIMINARY  PRELIMINARY  PRELIMINARY  Carotid duplex completed.    Preliminary report:  1-39% ICA stenosis. Vertebral artery flow is antegrade.   Eleesha Purkey, RVT 08/31/2016, 12:20 PM

## 2016-08-31 NOTE — Care Management Note (Signed)
Case Management Note  Patient Details  Name: KEORA ECCLESTON MRN: [id number] Date of Birth: 10-Nov-1922  Subjective/Objective:                 Patient provided with Eliquis card. No other CM needs identified at this time. Will DC with daughter   Action/Plan:   Expected Discharge Date:  08/31/16               Expected Discharge Plan:  [facility]  In-House Referral:     Discharge planning Services  CM Consult, Medication Assistance (Eliquis Card)  Post Acute Care Choice:    Choice offered to:  Patient, Adult Children  DME Arranged:    DME Agency:     HH Arranged:    [place] Agency:     Status of Service:  Completed, signed off  If discussed at [employer] of Stay Meetings, dates discussed:    Additional Comments:  Carles Collet, RN 08/31/2016, 4:20 PM

## 2016-08-31 NOTE — Progress Notes (Signed)
Pt d/c to home by car with family. Assessment stable. Prescription given. All questions answered 

## 2016-09-01 LAB — VAS US CAROTID
LCCADDIAS: 13 cm/s
LCCADSYS: 42 cm/s
LCCAPDIAS: -13 cm/s
LEFT ECA DIAS: -4 cm/s
LEFT VERTEBRAL DIAS: -2 cm/s
LICADDIAS: -16 cm/s
LICADSYS: -51 cm/s
LICAPDIAS: -13 cm/s
LICAPSYS: -48 cm/s
Left CCA prox sys: -80 cm/s
RCCAPSYS: 48 cm/s
RIGHT VERTEBRAL DIAS: -14 cm/s
Right CCA prox dias: 14 cm/s
Right cca dist sys: -83 cm/s

## 2016-09-01 LAB — HEMOGLOBIN A1C
Hgb A1c MFr Bld: 6.7 % — ABNORMAL HIGH (ref 4.8–5.6)
MEAN PLASMA GLUCOSE: 146 mg/dL

## 2016-09-02 ENCOUNTER — Telehealth: Payer: Self-pay | Admitting: Behavioral Health

## 2016-09-02 NOTE — Telephone Encounter (Signed)
thx

## 2016-09-02 NOTE — Telephone Encounter (Signed)
Transition Care Management Follow-up Telephone Call  PCP: Kathlene November, MD  Admit date: 08/30/2016 Discharge date: 08/31/2016  Recommendations for Outpatient Follow-up:  1. Dr. Kathlene November, PCP in 3 days. 2. Dr. Rosalin Hawking, Neurology in 8 weeks.  Home Health: None Equipment/Devices: None    Discharge Condition: Improved and stable  CODE STATUS: Full  Diet recommendation: Heart healthy diet  Discharge Diagnoses:  Principal Problem:   TIA (transient ischemic attack) Active Problems:   Hyperthyroidism   HTN (hypertension)   ATRIAL FIBRILLATION, chronic   Asthma, chronic   Chronic atrial fibrillation ([place])   How have you been since you were released from the hospital? Per patient's daughter Evah Rashid), "she's doing a lot better".   Do you understand why you were in the hospital? yes, the daughter voiced that the patient had a mini stroke.   Do you understand the discharge instructions? yes   Where were you discharged to? Home with daughter, Stela Iwasaki.   Items Reviewed:  Medications reviewed: yes  Allergies reviewed: yes  Dietary changes reviewed: yes, heart healthy diet  Referrals reviewed: yes, Dr. Kathlene November, PCP in 3 days; Dr. Rosalin Hawking, Neurology in 8 weeks.    Functional Questionnaire:   Activities of Daily Living (ADLs):   She states they are independent in the following: ambulation with walker. States they require assistance with the following: ambulation, bathing and hygiene, feeding, continence, grooming, toileting and dressing; daughter assists with ADL's.   Any transportation issues/concerns?: no   Any patient concerns? no   Confirmed importance and date/time of follow-up visits scheduled yes, 09/03/16 at 11:30 AM.  Provider Appointment booked with Dr. Larose Kells.  Confirmed with patient if condition begins to worsen call PCP or go to the ER.  Patient was given the office number and encouraged to call back with question or concerns.  : yes

## 2016-09-03 ENCOUNTER — Ambulatory Visit (INDEPENDENT_AMBULATORY_CARE_PROVIDER_SITE_OTHER): Payer: Medicare Other | Admitting: Internal Medicine

## 2016-09-03 ENCOUNTER — Encounter: Payer: Self-pay | Admitting: Internal Medicine

## 2016-09-03 VITALS — BP 128/78 | HR 75 | Temp 97.5°F | Resp 16 | Ht 62.0 in | Wt 169.0 lb

## 2016-09-03 DIAGNOSIS — I482 Chronic atrial fibrillation: Secondary | ICD-10-CM | POA: Diagnosis not present

## 2016-09-03 DIAGNOSIS — G458 Other transient cerebral ischemic attacks and related syndromes: Secondary | ICD-10-CM

## 2016-09-03 DIAGNOSIS — Z8719 Personal history of other diseases of the digestive system: Secondary | ICD-10-CM | POA: Diagnosis not present

## 2016-09-03 DIAGNOSIS — Z7901 Long term (current) use of anticoagulants: Secondary | ICD-10-CM

## 2016-09-03 MED ORDER — APIXABAN 5 MG PO TABS: 5.0000 mg | ORAL_TABLET | Freq: Two times a day (BID) | ORAL | 3 refills | Status: DC

## 2016-09-03 NOTE — Patient Instructions (Signed)
  GO TO THE FRONT DESK Schedule your next appointment for a  Check up in 3 months  Be sure you see the neurologist within few weeks  Watch for bleeding, stomach pain, change in the color of stools , weakness , pallor

## 2016-09-03 NOTE — Progress Notes (Signed)
Pre visit review using our clinic review tool, if applicable. No additional management support is needed unless otherwise documented below in the visit note. 

## 2016-09-03 NOTE — Progress Notes (Signed)
Subjective:    Patient ID: Rebecca Skinner, female    DOB: 1922/10/30, 83 y.o.   MRN: [id number]  DOS:  09/03/2016 Type of visit - description : TCM Interval history: Admitted to the hospital 08/30/2016 for one day. She was admitted due to aphasia, confusion. Symptoms resolved within an hour. She has a history of atrial fibrillation, not on anticoagulation due to GI bleed, prior to admission was on aspirin only. -MRI nonacute  -MRA- Moderate to severe stenosis at the origin of the left MCA anterior M2 branch. -Echo was not repeated, had one done a month ago. -After discussion between the physicians and the family at the hospital, the patient's daughter  elected to go home on Eliquis    Review of Systems  Since she left the hospital is doing well and taking her medications correctly. Denies any nausea, vomiting, diarrhea or blood in the stools. No new neuro symptoms Minimal nosebleed, at baseline compared to previous weeks. No ambulatory BPs.    Past Medical History:  Diagnosis Date  . Anxiety 11/20/2011  . Asthma    UNDER [facility] DR Lilias Lorensen  . CAD (coronary artery disease)    s/p CABG s/p AO valve replacement ([place] [facility])  . CVA (cerebral infarction) 08-2013   multiple, L, d/t Afib, started eloquis  . Diabetes mellitus    type 2   . Dizziness    Chronic, admiet 07-2010,saw neuro, thought to be a peripheral issue   . Dry skin   . GERD (gastroesophageal reflux disease)   . Hyperlipidemia   . Hypertension   . Hyperthyroidism   . Keloid    @ chest  . Memory loss   . Osteoarthritis   . Osteopenia    per dexa 12/09  . Paroxysmal atrial fibrillation ([place])    cards d/c coumadin 12/2008 d/t persisten NSR and frequent falls- restarted coumadin july 2011, now on Eliquis  . Recurrent UTI   . Shortness of breath dyspnea   . TIA (transient ischemic attack)     Past Surgical History:  Procedure Laterality Date  . ABDOMINAL HYSTERECTOMY    . AORTIC VALVE REPLACEMENT  1998    . CARDIAC VALVE REPLACEMENT    . CAROTID DOPPLER  07/13/12   BILATERAL BULB/PROXIMAL ICAS;MILD AMOUNT OF FIBROUS PLAQUE WITH NO DIAMETER REDUCTION.  . CESAREAN SECTION     x 2  . COLONOSCOPY WITH PROPOFOL N/A 03/30/2014   Procedure: COLONOSCOPY WITH PROPOFOL;  Surgeon: Irene Shipper, MD;  Location: [facility];  Service: Endoscopy;  Laterality: N/A;  . CORONARY ARTERY BYPASS GRAFT  1998   SVG TO RCA  . HEMORRHOID SURGERY    . JOINT REPLACEMENT    . MYOCARDIAL PERFUSION STUDY  12/19/09   NORMAL PATTERN OF PERFUSION IN ALL REGIONS.EF 75%.  . OOPHORECTOMY    . TOTAL KNEE ARTHROPLASTY  1999  . TRANSTHORACIC ECHOCARDIOGRAM  09/11/11   LVEF >55%.STAGE 1 DIASTOLIC DYSFUNCTION,ELEVATED LV FILLING PRESSURE.BIOPROSTHETIC AORTIC VALVE-PEAK AND MEAN GRADIENTS OF 17 MMHG AND 8 MMHG.SIGMOID SEPTUM.MILD TO MOD MR.MILD TO MOD TR.RVSP 46 MMHG.    Social History   Social History  . Marital status: Divorced    Spouse name: N/A  . Number of children: 4  . Years of education: N/A   Occupational History  . retired  Retired   Social History Main Topics  . Smoking status: Former Research scientist (life sciences)  . Smokeless tobacco: Never Used     Comment: quit 2004, smoked 1.5 ppd  .  Alcohol use No  . Drug use: No  . Sexual activity: Not on file   Other Topics Concern  . Not on file   Social History Narrative   Had to moved w/ her daughter [name])  ~ 01-2016   Had 3 daughter- 2 son  (lost oldest daughter and son), 2 living daughters in [place] as of 09/03/2016      Reactions   Hydrocodone Itching, Nausea Only   Tramadol Hcl Itching, Nausea Only      Medication List       Accurate as of 09/03/16  5:55 PM. Always use your most recent med list.          acetaminophen 500 MG tablet Commonly known as:  TYLENOL Take 1,000 mg by mouth every 6 (six) hours as needed for moderate pain (pain).   apixaban 5 MG Tabs tablet Commonly known as:  ELIQUIS Take 1 tablet (5 mg total) by mouth 2 (two)  times daily.   artificial tears Oint ophthalmic ointment Place into both eyes at bedtime as needed for dry eyes.   atorvastatin 40 MG tablet Commonly known as:  LIPITOR Take 1 tablet (40 mg total) by mouth at bedtime.   benazepril 20 MG tablet Commonly known as:  LOTENSIN TAKE 1 TABLET BY MOUTH DAILY   budesonide 0.5 MG/2ML nebulizer solution Commonly known as:  PULMICORT USE 1 VIAL VIA NEBULIZER TWICE A DAY   CALCIUM 500 PO Take 500 mg by mouth at bedtime.   cholecalciferol 1000 units tablet Commonly known as:  VITAMIN D Take 1,000 Units by mouth at bedtime.   diltiazem 120 MG 24 hr capsule Commonly known as:  CARDIZEM CD Take 1 capsule (120 mg total) by mouth daily.   dorzolamide-timolol 22.3-6.8 MG/ML ophthalmic solution Commonly known as:  COSOPT Place 1 drop into both eyes 2 (two) times daily.   furosemide 20 MG tablet Commonly known as:  LASIX Take 2 tablets (40 mg total) by mouth daily.   levalbuterol 0.63 MG/3ML nebulizer solution Commonly known as:  XOPENEX Take 3 mLs (0.63 mg total) by nebulization 2 (two) times daily.   magnesium oxide 400 (241.3 Mg) MG tablet Commonly known as:  MAG-OX Take 1 tablet (400 mg total) by mouth at bedtime.   methimazole 5 MG tablet Commonly known as:  TAPAZOLE Take 1 tablet (5 mg total) by mouth 3 (three) times a week.   metoprolol tartrate 25 MG tablet Commonly known as:  LOPRESSOR Take 1 tablet (25 mg total) by mouth 2 (two) times daily.   nitroGLYCERIN 0.4 MG SL tablet Commonly known as:  NITROSTAT Place 0.4 mg under the tongue every 5 (five) minutes as needed for chest pain (chest pain). Reported on 11/10/2015   potassium chloride 10 MEQ tablet Commonly known as:  KLOR-CON M10 Take 1 tablet (10 mEq total) by mouth at bedtime.   RESTASIS MULTIDOSE 0.05 % ophthalmic emulsion Generic drug:  cycloSPORINE Place 1 drop into both eyes daily.   TRAVATAN Z 0.004 % Soln ophthalmic solution Generic drug:  Travoprost (BAK  Free) Place 1 drop into both eyes at bedtime.          Objective:   Physical Exam BP 128/78 (BP Location: Left Arm, Patient Position: Sitting, Cuff Size: Normal)   Pulse 75   Temp 97.5 F (36.4 C) (Oral)   Resp 16   Ht 5\' 2"  (1.575 m)   Wt 169 lb (76.7 kg)  SpO2 93%   BMI 30.91 kg/m  General:   Well developed, well nourished . NAD.  HEENT:  Normocephalic . Face symmetric, atraumatic Lungs:  Scattered wheezing bilaterally, no increased work of breathing, no rhonchi. Normal respiratory effort, no intercostal retractions, no accessory muscle use. Heart: Irregularly irregular  no murmur.  No pretibial edema bilaterally  Skin: Not pale. Not jaundice Neurologic:  alert & oriented to self, not oriented in time, did not recall her breakfast.  Speech fluent, gait assisted by a rolling walker. Motor symmetric. Face symmetric Psych--    No anxious or depressed appearing.      Assessment & Plan:    Assessment DM HTN Hyperlipidemia Hyperthyroidism -- used to see Dr. Loanne Drilling, last visit 01-2016, now f/u PCP, check labs x 2 q year GERD Asthma  Anxiety  On xanax  CV: Dr. Claiborne Billings --CHF, CAD, CABG, Aortic valve replacement --P- atrial fibrillation, used to be on Coumadin, Eliquis rx after the stroke 3-15, d/c 03-2014 d/t GI bleed , on ASA 81. Dx TIA 08-2016, back on eliquis, asa d/c --TIAs, CVA 2015 DJD Osteopenia 2009 Mild cognitive impairment (discuss meds 08-2016) Recurrent UTIs Daughter  Danai Gotto; lives w/ [name]  PLAN TIA: After the last visit, developed aphasia and confusion, admitted to the hospital, DX with TIA. Labs were satisfactory,  Aspirin was d/c, is back on eliquis. The main focus of the visit today was to talk about the pros and cons of eliquis vs ASA, the pt's daughter seems to now the differences well and  so far is electing to stay on eliquise. Warning symptoms of bleeding discussed . We'll send a refill. Was advised to see neurology within few weeks, will  call if she is not contacted by neuro. RTC 3 months.

## 2016-09-03 NOTE — Assessment & Plan Note (Signed)
TIA: After the last visit, developed aphasia and confusion, admitted to the hospital, DX with TIA. Labs were satisfactory,  Aspirin was d/c, is back on eliquis. The main focus of the visit today was to talk about the pros and cons of eliquis vs ASA, the pt's daughter seems to now the differences well and  so far is electing to stay on eliquise. Warning symptoms of bleeding discussed . We'll send a refill. Was advised to see neurology within few weeks, will call if she is not contacted by neuro. RTC 3 months.

## 2016-09-04 DIAGNOSIS — H25813 Combined forms of age-related cataract, bilateral: Secondary | ICD-10-CM | POA: Diagnosis not present

## 2016-09-04 DIAGNOSIS — H18423 Band keratopathy, bilateral: Secondary | ICD-10-CM | POA: Diagnosis not present

## 2016-09-04 DIAGNOSIS — E119 Type 2 diabetes mellitus without complications: Secondary | ICD-10-CM | POA: Diagnosis not present

## 2016-09-04 DIAGNOSIS — H16223 Keratoconjunctivitis sicca, not specified as Sjogren's, bilateral: Secondary | ICD-10-CM | POA: Diagnosis not present

## 2016-09-04 DIAGNOSIS — H10413 Chronic giant papillary conjunctivitis, bilateral: Secondary | ICD-10-CM | POA: Diagnosis not present

## 2016-09-15 ENCOUNTER — Other Ambulatory Visit: Payer: Self-pay | Admitting: Internal Medicine

## 2016-09-20 ENCOUNTER — Encounter: Payer: Self-pay | Admitting: Cardiovascular Disease

## 2016-09-20 ENCOUNTER — Ambulatory Visit (INDEPENDENT_AMBULATORY_CARE_PROVIDER_SITE_OTHER): Payer: Medicare Other | Admitting: Cardiovascular Disease

## 2016-09-20 VITALS — BP 134/92 | HR 66 | Ht 62.0 in | Wt 169.0 lb

## 2016-09-20 DIAGNOSIS — Z7901 Long term (current) use of anticoagulants: Secondary | ICD-10-CM | POA: Diagnosis not present

## 2016-09-20 DIAGNOSIS — I10 Essential (primary) hypertension: Secondary | ICD-10-CM | POA: Diagnosis not present

## 2016-09-20 DIAGNOSIS — I25810 Atherosclerosis of coronary artery bypass graft(s) without angina pectoris: Secondary | ICD-10-CM | POA: Diagnosis not present

## 2016-09-20 DIAGNOSIS — I4821 Permanent atrial fibrillation: Secondary | ICD-10-CM

## 2016-09-20 DIAGNOSIS — Z8673 Personal history of transient ischemic attack (TIA), and cerebral infarction without residual deficits: Secondary | ICD-10-CM

## 2016-09-20 DIAGNOSIS — I482 Chronic atrial fibrillation: Secondary | ICD-10-CM

## 2016-09-20 DIAGNOSIS — Z952 Presence of prosthetic heart valve: Secondary | ICD-10-CM

## 2016-09-20 LAB — CBC
HCT: 42.2 % (ref 35.0–45.0)
HEMOGLOBIN: 13.7 g/dL (ref 11.7–15.5)
MCH: 33 pg (ref 27.0–33.0)
MCHC: 32.5 g/dL (ref 32.0–36.0)
MCV: 101.7 fL — ABNORMAL HIGH (ref 80.0–100.0)
MPV: 10.6 fL (ref 7.5–12.5)
Platelets: 172 10*3/uL (ref 140–400)
RBC: 4.15 MIL/uL (ref 3.80–5.10)
RDW: 13.9 % (ref 11.0–15.0)
WBC: 6.8 10*3/uL (ref 3.8–10.8)

## 2016-09-20 NOTE — Progress Notes (Signed)
Patient ID: Rebecca Skinner, female   DOB: [birth year], 83 y.o.   MRN: [id number]      HPI: Rebecca Skinner is an 83 year old female who  established cardiology care with me in February 2015. She is a former patient of Dr. Rollene Skinner.  She presents for a  follow-up cardiology evaluation following a recent TIA requiring overnight hospitalization 3 weeks ago.  Rebecca Skinner underwent a pericardial aortic valve replacement in 1998 and single vessel bypass with a vein graft to the right artery artery. She also has a history of sick sinus syndrome with paroxysmal atrial fibrillation and was taken off Coumadin anticoagulation in the past due to multiple falls in 2009 in 2010. There is no history of CVA or TIAs. She does have significant arthritic issues with back discomfort and also arthritis of her knees. She last saw Dr. Rollene Skinner one year ago and at that time, she was in a normal sinus rhythm.  In 2015  Rebecca Skinner had noticed increasing palpitations as well as chest fluttering. She has significant shortness of breath almost all the time. She currently is living with her grandson. She does note some wheezing. Her last nuclear perfusion study was in 2011 which was normal. Her last echo Doppler study was in 2013 which showed moderate asymmetric LVH with septal wall at 1.6 cm and posterior wall 1.1 cm of the sigmoid septum. Ejection fraction was greater than 55%. She had mild to moderate mitral regurgitation, mild to moderate tricuspid regurgitation with elevation of RV systolic pressure at 46 mm. There bioprosthetic aortic valve had a peak gradient of 17 and mean gradient of 8 within normal limits for this valve. On 08/12/2013 she underwent a nuclear perfusion study which was interpreted as intermediate risk with it suggested a fixed lateral defect with minimal peri-infarction ischemia. A CardioNet monitor had shown atrial fibrillation with bursts of increased heart rate.  She walks with a walker. She has undergone left knee  replacement. She has history of diabetes mellitus as well as hypertension. She had been on dual antiplatelet therapy with aspirin and Plavix. She was hospitalized on on the neurology service from 3/20 - 24, 2015 with CVA. Her symptoms have resolved. Carotid studies demonstrated mild internal carotid stenoses of less than 40%. During that evaluation, she apparently was taken off aspirin and Plavix by Dr. Leonie Skinner and was given a prescription for Eliquis  5 mg since was felt that her CVA was due to atrial fibrillation.  She presented to [facility] long emergency room on 03/28/2014 with complaints of rectal bleeding for approximately a week.  She was found to have significant anemia with a hemoglobin of 7.4 and hematocrit of 22.2.  She was treated with 3 units of packed red blood cells during her admission.  Eliquis and aspirin were held, and she is not been on this since. And was restarted back on aspirin.  She underwent colonoscopy and was found to have an AVM in the cecum area, which was felt to be the cause of her lower extremity bleeding.  n July 2016 a follow-up echo Doppler study showed an EF of 60-65%.  There was grade 2 diastolic dysfunction.  Her bioprosthetic aortic valve was well-seated and functioning normally.  Mean gradient was 5.  There was moderate RA dilatation and severe TR with increased.  PA pressure at 63 mm.  When I saw her on July 11, 2016, she had  very slow atrial fibrillation at 45 beats and suggested a junctional rhythm.  At  that time, she had been on diltiazem 120 mg twice a day, and this was weaned and ultimately discontinued.  Subsequently , she  resumed taking just 120 mg daily.   A repeat echo Doppler study on 08/06/2016 showed an EF of 65-70%.  His aortic bioprosthesis was functioning well with a mean gradient of 6.  Her left atrium was moderately dilated.  There was mild MR, but she had evidence for severe tricuspid regurgitation with moderate pulmonary hypertension with estimated  systolic pressure at 51 mm.  She also has diabetes mellitus, as well as thyroid disease.    Since I last saw her on 08/26/2016, she was hospitalized overnight on 08/30/2016, she developed a aphasia and became confused.  Deficits resolved within an hour and were gone by the time she had arrived in the emergency room.  She was felt to have had a TIA.  An MRI did not reveal any acute infarct.  An MRA showed moderate to severe stenosis at the origen of the left SMA anterior M2 branch.  A CT of her head showed chronic bilateral MCA infarcts.  Carotid Doppler study showed 1-39% ICA stenosis with antegrade vertebral flow.  She was seen by Rebecca Skinner of neurology, although she had issues with GI bleeds in the past felt that her AVMs were treated, and although there was some risk with reinstitution of anticoagulation she was started on eliquis 5 mg twice a day.  She denies any recurrent symptomatology.  She denies any chest pain.  She presents today for evaluation.  Past Medical History:  Diagnosis Date  . Anxiety 11/20/2011  . Asthma    UNDER THE CARE [facility] DR PAZ  . CAD (coronary artery disease)    s/p CABG s/p AO valve replacement ([place] [facility])  . CVA (cerebral infarction) 08-2013   multiple, L, d/t Afib, started eloquis  . Diabetes mellitus    type 2   . Dizziness    Chronic, admiet 07-2010,saw neuro, thought to be a peripheral issue   . Dry skin   . GERD (gastroesophageal reflux disease)   . Hyperlipidemia   . Hypertension   . Hyperthyroidism   . Keloid    @ chest  . Memory Skinner   . Osteoarthritis   . Osteopenia    per dexa 12/09  . Paroxysmal atrial fibrillation ([place])    cards d/c coumadin 12/2008 d/t persisten NSR and frequent falls- restarted coumadin july 2011, now on Eliquis  . Recurrent UTI   . Shortness of breath dyspnea   . TIA (transient ischemic attack)     Past Surgical History:  Procedure Laterality Date  . ABDOMINAL HYSTERECTOMY    . AORTIC VALVE REPLACEMENT  1998  .  CARDIAC VALVE REPLACEMENT    . CAROTID DOPPLER  07/13/12   BILATERAL BULB/PROXIMAL ICAS;MILD AMOUNT OF FIBROUS PLAQUE WITH NO DIAMETER REDUCTION.  . CESAREAN SECTION     x 2  . COLONOSCOPY WITH PROPOFOL N/A 03/30/2014   Procedure: COLONOSCOPY WITH PROPOFOL;  Surgeon: Irene Shipper, MD;  Location: [facility];  Service: Endoscopy;  Laterality: N/A;  . CORONARY ARTERY BYPASS GRAFT  1998   SVG TO RCA  . HEMORRHOID SURGERY    . JOINT REPLACEMENT    . MYOCARDIAL PERFUSION STUDY  12/19/09   NORMAL PATTERN OF PERFUSION IN ALL REGIONS.EF 75%.  . OOPHORECTOMY    . TOTAL KNEE ARTHROPLASTY  1999  . TRANSTHORACIC ECHOCARDIOGRAM  09/11/11   LVEF >55%.STAGE 1 DIASTOLIC DYSFUNCTION,ELEVATED LV FILLING PRESSURE.BIOPROSTHETIC  AORTIC VALVE-PEAK AND MEAN GRADIENTS OF 17 MMHG AND 8 MMHG.SIGMOID SEPTUM.MILD TO MOD MR.MILD TO MOD TR.RVSP 46 MMHG.    Allergies  Allergen Reactions  . Hydrocodone Itching and Nausea Only  . Tramadol Hcl Itching and Nausea Only    Current Outpatient Prescriptions  Medication Sig Dispense Refill  . acetaminophen (TYLENOL) 500 MG tablet Take 1,000 mg by mouth every 6 (six) hours as needed for moderate pain (pain).    Marland Kitchen apixaban (ELIQUIS) 5 MG TABS tablet Take 1 tablet (5 mg total) by mouth 2 (two) times daily. 60 tablet 3  . artificial tears (LACRILUBE) OINT ophthalmic ointment Place into both eyes at bedtime as needed for dry eyes. 3.5 g 11  . atorvastatin (LIPITOR) 40 MG tablet Take 1 tablet (40 mg total) by mouth at bedtime. 90 tablet 2  . benazepril (LOTENSIN) 20 MG tablet TAKE 1 TABLET BY MOUTH DAILY 90 tablet 3  . budesonide (PULMICORT) 0.5 MG/2ML nebulizer solution USE 1 VIAL VIA NEBULIZER TWICE A DAY 120 mL 5  . Calcium Carbonate (CALCIUM 500 PO) Take 500 mg by mouth at bedtime.     . cholecalciferol (VITAMIN D) 1000 UNITS tablet Take 1,000 Units by mouth at bedtime.     Marland Kitchen diltiazem (CARDIZEM CD) 120 MG 24 hr capsule Take 1 capsule (120 mg total) by mouth daily. 90 capsule  3  . dorzolamide-timolol (COSOPT) 22.3-6.8 MG/ML ophthalmic solution Place 1 drop into both eyes 2 (two) times daily.     Marland Kitchen esomeprazole (NEXIUM) 40 MG capsule Take 1 capsule (40 mg total) by mouth daily before breakfast. 90 capsule 1  . furosemide (LASIX) 20 MG tablet Take 2 tablets (40 mg total) by mouth daily. 180 tablet 1  . levalbuterol (XOPENEX) 0.63 MG/3ML nebulizer solution Take 3 mLs (0.63 mg total) by nebulization 2 (two) times daily. 180 mL 5  . magnesium oxide (MAG-OX) 400 (241.3 Mg) MG tablet Take 1 tablet (400 mg total) by mouth at bedtime.    . methimazole (TAPAZOLE) 5 MG tablet Take 1 tablet (5 mg total) by mouth 3 (three) times a week. (Patient taking differently: Take 5 mg by mouth every Monday, Wednesday, and Friday. ) 40 tablet 1  . metoprolol tartrate (LOPRESSOR) 25 MG tablet Take 1 tablet (25 mg total) by mouth 2 (two) times daily.    . nitroGLYCERIN (NITROSTAT) 0.4 MG SL tablet Place 0.4 mg under the tongue every 5 (five) minutes as needed for chest pain (chest pain). Reported on 11/10/2015    . potassium chloride (KLOR-CON M10) 10 MEQ tablet Take 1 tablet (10 mEq total) by mouth at bedtime.    . RESTASIS MULTIDOSE 0.05 % ophthalmic emulsion Place 1 drop into both eyes daily.     . TRAVATAN Z 0.004 % SOLN ophthalmic solution Place 1 drop into both eyes at bedtime.      No current facility-administered medications for this visit.     Socially she is widowed. She lives with her grandson. She has 2 deceased children, 4 children alive as well as 4 grandchildren 3 great-grandchildren. There is no tobacco use.  Family History  Problem Relation Age of Onset  . Heart attack Father   . Coronary artery disease Son 71    deceased  . Hypertension Brother   . Stroke Brother   . Colon cancer Neg Hx   . Breast cancer Neg Hx   . Thyroid disease Neg Hx    FH is noteworthy in that her father suffered an MI  at age 60. Her mother died at childbirth.  ROS General: Negative; No fevers,  chills, or night sweats;  HEENT: Negative; No changes in vision or hearing, sinus congestion, difficulty swallowing Pulmonary: Negative; No cough, wheezing, shortness of breath, hemoptysis Cardiovascular: No chest pain, presyncope, syncope, palpitations GI: Recent GI blood Skinner requiring 3 unit packed red blood cell transfusion GU: Negative; No dysuria, hematuria, or difficulty voiding Musculoskeletal: Negative; no myalgias, joint pain, or weakness Hematologic/Oncology: Negative; no easy bruising, bleeding Endocrine: Negative; no heat/cold intolerance; no diabetes Neuro: Positive CVA March 2015 resolution of symptoms Skin: Negative; No rashes or skin lesions Psychiatric: Negative; No behavioral problems, depression Sleep: Negative; No snoring, daytime sleepiness, hypersomnolence, bruxism, restless legs, hypnogognic hallucinations, no cataplexy Other comprehensive 14 point system review is negative.  PE BP (!) 134/92   Pulse 66   Ht '5\' 2"'  (1.575 m)   Wt 169 lb (76.7 kg)   BMI 30.91 kg/m    Repeat blood pressure by me was 124/76.  Pulse was in the 60s-70s.  Wt Readings from Last 3 Encounters:  09/20/16 169 lb (76.7 kg)  09/03/16 169 lb (76.7 kg)  08/30/16 172 lb 2.9 oz (78.1 kg)   General: Alert, oriented, no distress.  Skin: normal turgor, no rashes HEENT: Normocephalic, atraumatic. There is significant arcus senilis.  Pupils round and reactive; sclera anicteric; extraocular muscles intact; Fundi arteriolar narrowing. Nose without nasal septal hypertrophy Mouth/Parynx benign; Mallinpatti scale 2 Neck: No JVD, possible soft carotid bruits versus transmitted murmur; normal carotid upstroke Lungs: clear to ausculatation and percussion; no wheezing or rales Chest wall: without tenderness to palpitation  Heart: irregularly irregular rhythm with a ventricular rate in the 60 -70s. s1 s2 normal; 2/6 systolic murmur at the right and left sternal border. No diastolic murmur. No rubs  thrills or heaves Abdomen: soft, nontender; no hepatosplenomehaly, BS+; abdominal aorta nontender and not dilated by palpation. Back: no CVA tenderness Pulses 2+ Extremities: no clubbing cyanosis or edema, Homan's sign negative  Neurologic: grossly nonfocal; Cranial nerves grossly wnl Psychologic: Normal mood and affect  ECG (independently read by me): Atrial fibrillation at 66 bpm.  Nonspecific diffuse T-wave abnormalities.  QTc interval 434 ms.  08/26/2016 ECG (independently read by me): Atrial fibrillation with rate 70-80.  Isolated PVC.  QTc interval 490 ms.  Lateral anterolateral T-wave changes.  July 11, 2016 ECG (independently read by me): Slow atrial fibrillation/junctional rhythm with heart rate 45 bpm.  Nonspecific ST changes.  QTc interval 427 ms.  December 2015 ECG (Independently read by me): Atrial fibrillation at 66 bpm.  Nonspecific ST changes.  April 2015 ECG (Independently read by me): Atrial fibrillation at 76 beats per minute. QTc interval 445  Prior ECG (independently read by me): Atrial flutter with variable block at approximately 65 beats per minute  LABS: BMP Latest Ref Rng & Units 08/31/2016 08/30/2016 08/30/2016  Glucose 65 - 99 mg/dL 116(H) 124(H) 125(H)  BUN 6 - 20 mg/dL '11 18 13  ' Creatinine 0.44 - 1.00 mg/dL 0.85 0.80 0.87  Sodium 135 - 145 mmol/L 141 141 140  Potassium 3.5 - 5.1 mmol/L 4.0 4.3 4.3  Chloride 101 - 111 mmol/L 105 102 105  CO2 22 - 32 mmol/L 29 - 26  Calcium 8.9 - 10.3 mg/dL 8.9 - 8.9   Hepatic Function Latest Ref Rng & Units 08/30/2016 03/05/2016 07/26/2015  Total Protein 6.5 - 8.1 g/dL 6.6 6.4(L) 7.2  Albumin 3.5 - 5.0 g/dL 3.2(L) 3.6 4.0  AST 15 - 41 U/L  '31 19 21  ' ALT 14 - 54 U/L '26 15 17  ' Alk Phosphatase 38 - 126 U/L 82 83 91  Total Bilirubin 0.3 - 1.2 mg/dL 0.7 0.6 0.4  Bilirubin, Direct 0.0 - 0.3 mg/dL - - -   CBC Latest Ref Rng & Units 09/20/2016 08/31/2016 08/30/2016  WBC 3.8 - 10.8 K/uL 6.8 5.9 -  Hemoglobin 11.7 - 15.5 g/dL 13.7  12.0 13.6  Hematocrit 35.0 - 45.0 % 42.2 38.3 40.0  Platelets 140 - 400 K/uL 172 196 -   Lab Results  Component Value Date   MCV 101.7 (H) 09/20/2016   MCV 100.8 (H) 08/31/2016   MCV 101.2 (H) 08/30/2016   Lab Results  Component Value Date   TSH 1.42 08/28/2016   Lab Results  Component Value Date   HGBA1C 6.7 (H) 08/31/2016   Lipid Panel     Component Value Date/Time   CHOL 104 08/31/2016 0700   TRIG 63 08/31/2016 0700   HDL 35 (L) 08/31/2016 0700   CHOLHDL 3.0 08/31/2016 0700   VLDL 13 08/31/2016 0700   LDLCALC 56 08/31/2016 0700    RADIOLOGY: No results found.  IMPRESSION:  1. Permanent atrial fibrillation ([place])   2. Essential hypertension   3. Coronary artery disease involving coronary bypass graft of native heart without angina pectoris   4. S/P AVR (aortic valve replacement)   5. History of transient ischemic attack (TIA)   6. Anticoagulation adequate      ASSESSMENT AND PLAN: Rebecca Skinner is an 83 year old African American female who is 20 years status post aortic valve replacement with a pericardial tissue valve at which time she underwent single-vessel bypass with a saphenous vein graft placed to her RCA in 1998.  A  Subsequent nuclear perfusion study showed a lateral defect with minimal peri-infarction ischemia was not felt to be high risk. She has a history of atrial fibrillation and in the past was felt to be a fall risk for Coumadin.  In March 2015 she was found to have probable multiple embolic events leading to her neurologic symptoms.  Initially was on eliquis  anticoagulation, but due to GI bleeding from AVMs which were cauterized and treated.  She had been on aspirin therapy.  Since I last saw her, she had another episode which was felt to be a TIA associated with transient aphasia and confusion.  Her MRI, MRA, head CT and other data as noted above.  Apparently, during her hospitalization there was a lengthy discussion concerning the risks benefits of  reinstituting anticoagulation.  She is 83 years old, she has normal renal function and her body weight dictates the 5 mg twice a day dosing.  Again discussed the risk, benefits of anticoagulation with the patient and her daughter.  Her atrial fibrillation rate is controlled on diltiazem 120 mg daily and metoprolol 25 mg twice a day.  Her blood pressure is stable.  Also, with the addition of furosemide 40 mg in addition to open as a pill 20 mg daily.  She has not had any bleeding since stopped her recent TIA.  I will check a CBC today to make certain she is not had any subtle drop in hemoglobin.  She will monitor her stool.  If there are issues with fall risk, we will need to reassess her anticoagulation status.  I again reviewed her echo Doppler data which shows a well functioning aortic bioprosthesis.  Time spent: 25 minutes  Troy Sine, MD, [facility] 09/22/2016 11:23 AM

## 2016-09-20 NOTE — Patient Instructions (Signed)
Your physician recommends that you return for lab work in: TODAY and again in 3 months.  Your physician recommends that you schedule a follow-up appointment in: 3 months.

## 2016-09-23 ENCOUNTER — Encounter: Payer: Self-pay | Admitting: *Deleted

## 2016-09-24 ENCOUNTER — Telehealth: Payer: Self-pay | Admitting: *Deleted

## 2016-09-24 NOTE — Telephone Encounter (Signed)
Left results on patient answering machine.

## 2016-09-24 NOTE — Telephone Encounter (Signed)
-----   Message from Troy Sine, MD sent at 09/23/2016  4:38 PM EDT ----- Hemoglobin and hematocrit improved.  Very mild macrocytosis, not significantly changed.

## 2016-09-26 ENCOUNTER — Other Ambulatory Visit: Payer: Self-pay | Admitting: Internal Medicine

## 2016-10-01 ENCOUNTER — Other Ambulatory Visit: Payer: Self-pay | Admitting: Internal Medicine

## 2016-10-11 ENCOUNTER — Encounter (INDEPENDENT_AMBULATORY_CARE_PROVIDER_SITE_OTHER): Payer: Self-pay

## 2016-10-11 ENCOUNTER — Ambulatory Visit (INDEPENDENT_AMBULATORY_CARE_PROVIDER_SITE_OTHER): Payer: Medicare Other | Admitting: Nurse Practitioner

## 2016-10-11 ENCOUNTER — Encounter: Payer: Self-pay | Admitting: Nurse Practitioner

## 2016-10-11 VITALS — BP 140/80 | HR 73 | Ht 62.0 in | Wt 168.4 lb

## 2016-10-11 DIAGNOSIS — I10 Essential (primary) hypertension: Secondary | ICD-10-CM | POA: Diagnosis not present

## 2016-10-11 DIAGNOSIS — G458 Other transient cerebral ischemic attacks and related syndromes: Secondary | ICD-10-CM

## 2016-10-11 DIAGNOSIS — E785 Hyperlipidemia, unspecified: Secondary | ICD-10-CM | POA: Diagnosis not present

## 2016-10-11 DIAGNOSIS — I4821 Permanent atrial fibrillation: Secondary | ICD-10-CM

## 2016-10-11 DIAGNOSIS — I482 Chronic atrial fibrillation: Secondary | ICD-10-CM | POA: Diagnosis not present

## 2016-10-11 NOTE — Progress Notes (Signed)
[facility]  PATIENT: Rebecca Skinner DOB: 1922/08/27   REASON FOR VISIT: Hospital follow-up for TIA due to atrial fibrillation not on anticoagulant HISTORY FROM: Patient and daughter [name]   HISTORY OF PRESENT ILLNESS: Ms. Blower, 83 year old female was admitted to the hospital after experiencing difficulty with her speech and a hard time recognizing her family. She had some upper extremity weakness on the left side. She has some word finding problems CT of the head showed no bleed or acute stroke but did show multiple old bilateral infarcts. MRI no acute infarct is evident MRA moderate to severe stenosis at the origin of the left MCA anterior M2 branch. Carotid Doppler 1-39% ICA stenosis. 2-D echo 65-70% EF atrial fibrillation was noted LDL 56. Hemoglobin A1c 6.7. Patient had previously been on Coumadin but discontinued due to previous multiple falls in 2009 and 2010. She has a history of GI bleed . She has a history of aortic valve replacement 20 years ago.  She returns to the stroke clinic today for follow-up. She is currently on eliquis for secondary stroke prevention and atrial fibrillation. She has no bruising and no bleeding. She is on Lipitor without complaints of myalgias. Blood pressure in the office today 140/80. She is compliant with her blood pressure medications. Appetite is good she is sleeping well. She denies any palpitations or chest pain. She ambulates with a rolling walker, no recent falls she returns for reevaluation    REVIEW OF SYSTEMS: Full 14 system review of systems performed and notable only for those listed, all others are neg:  Constitutional: neg  Cardiovascular: neg Ear/Nose/Throat: neg  Skin: Itching and dry Eyes: neg Respiratory: neg Gastroitestinal: neg  Hematology/Lymphatic: neg  Endocrine: neg Musculoskeletal: Back pain Allergy/Immunology: neg Neurological: Memory loss Psychiatric: neg Sleep : neg   ALLERGIES: Allergies  Allergen  Reactions  . Hydrocodone Itching and Nausea Only  . Tramadol Hcl Itching and Nausea Only    HOME MEDICATIONS: Outpatient Medications Prior to Visit  Medication Sig Dispense Refill  . acetaminophen (TYLENOL) 500 MG tablet Take 1,000 mg by mouth every 6 (six) hours as needed for moderate pain (pain).    Marland Kitchen apixaban (ELIQUIS) 5 MG TABS tablet Take 1 tablet (5 mg total) by mouth 2 (two) times daily. 60 tablet 3  . artificial tears (LACRILUBE) OINT ophthalmic ointment Place into both eyes at bedtime as needed for dry eyes. 3.5 g 11  . atorvastatin (LIPITOR) 40 MG tablet Take 1 tablet (40 mg total) by mouth at bedtime. 90 tablet 2  . benazepril (LOTENSIN) 20 MG tablet TAKE 1 TABLET BY MOUTH DAILY 90 tablet 3  . budesonide (PULMICORT) 0.5 MG/2ML nebulizer solution USE 1 VIAL VIA NEBULIZER TWICE A DAY 120 mL 5  . Calcium Carbonate (CALCIUM 500 PO) Take 500 mg by mouth at bedtime.     . cholecalciferol (VITAMIN D) 1000 UNITS tablet Take 1,000 Units by mouth at bedtime.     Marland Kitchen diltiazem (CARDIZEM CD) 120 MG 24 hr capsule Take 1 capsule (120 mg total) by mouth daily. 90 capsule 3  . dorzolamide-timolol (COSOPT) 22.3-6.8 MG/ML ophthalmic solution Place 1 drop into both eyes 2 (two) times daily.     Marland Kitchen esomeprazole (NEXIUM) 40 MG capsule Take 1 capsule (40 mg total) by mouth daily before breakfast. 90 capsule 1  . furosemide (LASIX) 20 MG tablet Take 2 tablets (40 mg total) by mouth daily. 180 tablet 1  . levalbuterol (XOPENEX) 0.63 MG/3ML nebulizer solution Take 3 mLs (  0.63 mg total) by nebulization 2 (two) times daily. 180 mL 5  . magnesium oxide (MAG-OX) 400 (241.3 Mg) MG tablet Take 1 tablet (400 mg total) by mouth at bedtime.    . methimazole (TAPAZOLE) 5 MG tablet Take 1 tablet (5 mg total) by mouth 3 (three) times a week. 40 tablet 3  . metoprolol tartrate (LOPRESSOR) 25 MG tablet Take 1 tablet (25 mg total) by mouth 2 (two) times daily.    . nitroGLYCERIN (NITROSTAT) 0.4 MG SL tablet Place 0.4 mg  under the tongue every 5 (five) minutes as needed for chest pain (chest pain). Reported on 11/10/2015    . potassium chloride (KLOR-CON M10) 10 MEQ tablet Take 1 tablet (10 mEq total) by mouth at bedtime.    . RESTASIS MULTIDOSE 0.05 % ophthalmic emulsion Place 1 drop into both eyes daily.     . TRAVATAN Z 0.004 % SOLN ophthalmic solution Place 1 drop into both eyes at bedtime.      No facility-administered medications prior to visit.     PAST MEDICAL HISTORY: Past Medical History:  Diagnosis Date  . Anxiety 11/20/2011  . Asthma    UNDER THE CARE [facility] DR PAZ  . CAD (coronary artery disease)    s/p CABG s/p AO valve replacement ([place] [facility])  . CVA (cerebral infarction) 08-2013   multiple, L, d/t Afib, started eloquis  . Diabetes mellitus    type 2   . Dizziness    Chronic, admiet 07-2010,saw neuro, thought to be a peripheral issue   . Dry skin   . GERD (gastroesophageal reflux disease)   . Hyperlipidemia   . Hypertension   . Hyperthyroidism   . Keloid    @ chest  . Memory loss   . Osteoarthritis   . Osteopenia    per dexa 12/09  . Paroxysmal atrial fibrillation ([place])    cards d/c coumadin 12/2008 d/t persisten NSR and frequent falls- restarted coumadin july 2011, now on Eliquis  . Recurrent UTI   . Shortness of breath dyspnea   . TIA (transient ischemic attack)     PAST SURGICAL HISTORY: Past Surgical History:  Procedure Laterality Date  . ABDOMINAL HYSTERECTOMY    . AORTIC VALVE REPLACEMENT  1998  . CARDIAC VALVE REPLACEMENT    . CAROTID DOPPLER  07/13/12   BILATERAL BULB/PROXIMAL ICAS;MILD AMOUNT OF FIBROUS PLAQUE WITH NO DIAMETER REDUCTION.  . CESAREAN SECTION     x 2  . COLONOSCOPY WITH PROPOFOL N/A 03/30/2014   Procedure: COLONOSCOPY WITH PROPOFOL;  Surgeon: Irene Shipper, MD;  Location: [facility];  Service: Endoscopy;  Laterality: N/A;  . CORONARY ARTERY BYPASS GRAFT  1998   SVG TO RCA  . HEMORRHOID SURGERY    . JOINT REPLACEMENT    . MYOCARDIAL PERFUSION  STUDY  12/19/09   NORMAL PATTERN OF PERFUSION IN ALL REGIONS.EF 75%.  . OOPHORECTOMY    . TOTAL KNEE ARTHROPLASTY  1999  . TRANSTHORACIC ECHOCARDIOGRAM  09/11/11   LVEF >55%.STAGE 1 DIASTOLIC DYSFUNCTION,ELEVATED LV FILLING PRESSURE.BIOPROSTHETIC AORTIC VALVE-PEAK AND MEAN GRADIENTS OF 17 MMHG AND 8 MMHG.SIGMOID SEPTUM.MILD TO MOD MR.MILD TO MOD TR.RVSP 46 MMHG.    FAMILY HISTORY: Family History  Problem Relation Age of Onset  . Heart attack Father   . Coronary artery disease Son 21    deceased  . Hypertension Brother   . Stroke Brother   . Colon cancer Neg Hx   . Breast cancer Neg Hx   . Thyroid disease  Neg Hx     SOCIAL HISTORY: Social History   Social History  . Marital status: Divorced    Spouse name: N/A  . Number of children: 4  . Years of education: N/A   Occupational History  . retired  Retired   Social History Main Topics  . Smoking status: Former Research scientist (life sciences)  . Smokeless tobacco: Never Used     Comment: quit 2004, smoked 1.5 ppd  . Alcohol use No  . Drug use: No  . Sexual activity: Not on file   Other Topics Concern  . Not on file   Social History Narrative   Had to moved w/ her daughter [name])  ~ 01-2016   Had 3 daughter- 2 son  (lost oldest daughter and son), 2 living daughters in [place]:   10/11/16 0747  BP: (!) 140/80   Pulse: 73  Weight: 168 lb 6.4 oz (76.4 kg)  Height: 5\' 2"  (1.575 m)   Body mass index is 30.8 kg/m.  Generalized: Well developed, Obese female in no acute distress , well-groomed Head: normocephalic and atraumatic,. Oropharynx benign  Neck: Supple, no carotid bruits  Cardiac: Irregular irregular rate rhythm,  Systolic  murmur audible Musculoskeletal: No deformity   Neurological examination   Mentation: Alert oriented to time, place, daughter provides most of the history  Attention span and concentration appropriate.  Follows all commands speech and language fluent.   Cranial nerve II-XII:    Pupils  equal round reactive to light, extraocular movements were full, visual field were full on confrontational test. Facial sensation and strength were normal. Hard of hearing. Uvula tongue midline. head turning and shoulder shrug were normal and symmetric.Tongue protrusion into cheek strength was normal. Motor: normal bulk and tone, full strength in the BUE, BLE, fine finger movements normal, no pronator drift. No focal weakness Sensory: normal and symmetric to light touch, pinprick, and  Vibin the upper and lower extremities Coordination: finger-nose-finger, heel-to-shin bilaterally, no dysmetria Reflexes: Brachioradialis 2/2, biceps 2/2, triceps 2/2, patellar 2/2, Achilles 2/2, plantar responses were flexor bilaterally. Gait and Station: Rising up from seated position with push off ambulated 30 feet in the hall with rolling walker gait is slow and steady.  DIAGNOSTIC DATA (LABS, IMAGING, TESTING) - I reviewed patient records, labs, notes, testing and imaging myself where available.  Lab Results  Component Value Date   WBC 6.8 09/20/2016   HGB 13.7 09/20/2016   HCT 42.2 09/20/2016   MCV 101.7 (H) 09/20/2016   PLT 172 09/20/2016      Component Value Date/Time   NA 141 08/31/2016 0700   K 4.0 08/31/2016 0700   CL 105 08/31/2016 0700   CO2 29 08/31/2016 0700   GLUCOSE 116 (H) 08/31/2016 0700   BUN 11 08/31/2016 0700   CREATININE 0.85 08/31/2016 0700   CREATININE 0.98 08/12/2013 0840   CALCIUM 8.9 08/31/2016 0700   PROT 6.6 08/30/2016 0926   ALBUMIN 3.2 (L) 08/30/2016 0926   AST 31 08/30/2016 0926   ALT 26 08/30/2016 0926   ALKPHOS 82 08/30/2016 0926   BILITOT 0.7 08/30/2016 0926   GFRNONAA 58 (L) 08/31/2016 0700   GFRAA >60 08/31/2016 0700   Lab Results  Component Value Date   CHOL 104 08/31/2016   HDL 35 (L) 08/31/2016   LDLCALC 56 08/31/2016   TRIG 63 08/31/2016   CHOLHDL 3.0 08/31/2016   Lab Results  Component Value Date   HGBA1C 6.7 (H)  08/31/2016   Lab  Results  Component Value Date   [id number] 694 03/06/2016   Lab Results  Component Value Date   TSH 1.42 08/28/2016      ASSESSMENT AND PLAN  83 y.o. year old female  has a past medical history of CAD (coronary artery disease); CVA (cerebral infarction) (08-2013); Diabetes mellitus;  Hyperlipidemia; Hypertension;  Memory loss; Osteoarthritis; Osteopenia; Paroxysmal atrial fibrillation ([place]); I;  TIA (transient ischemic attack). here for hospital follow-up for her TIA due to atrial fibrillation not on anticoagulant.The patient is a current patient of Dr. Erlinda Hong  who is out of the office today . This note is sent to the work in doctor.       PLAN: Stressed the importance of management of risk factors to prevent further stroke Continue eliquis for secondary stroke prevention and atrial fibrillation Maintain strict control of hypertension with blood pressure goal below 130/90, today's reading 140/80 continue antihypertensive medications Control of diabetes with hemoglobin A1c below 6.5 followed by primary care most recent hemoglobin A1c 6.7 Cholesterol with LDL cholesterol less than 70, followed by primary care,  most recent 56  continue Lipitor Exercise by walking, slowly increase , eat healthy diet with whole grains,  fresh fruits and vegetables Follow up in 6 months Discussed risk for recurrent stroke/ TIA and answered additional questions This was a prolonged visit requiring 30 minutes of medical decision making of high complexity with extensive review of history, hospital chart, counseling and answering questions for patient and daughter Dennie Bible, [facility], [facility], [facility] [address], [place], [place] [address] [phone]

## 2016-10-11 NOTE — Patient Instructions (Signed)
Stressed the importance of management of risk factors to prevent further stroke Continue eliquis for secondary stroke prevention and atrial fibrillation Maintain strict control of hypertension with blood pressure goal below 130/90, today's reading 140/80 continue antihypertensive medications Control of diabetes with hemoglobin A1c below 6.5 followed by primary care most recent hemoglobin A1c 6.7 Cholesterol with LDL cholesterol less than 70, followed by primary care,  most recent 56  continue Lipitor Exercise by walking, slowly increase , eat healthy diet with whole grains,  fresh fruits and vegetables Follow up in 6 months

## 2016-10-14 ENCOUNTER — Ambulatory Visit: Payer: Medicare Other | Admitting: Neurology

## 2016-10-14 NOTE — Progress Notes (Signed)
I have reviewed and agreed above plan. 

## 2016-10-23 ENCOUNTER — Encounter (HOSPITAL_COMMUNITY): Payer: Self-pay | Admitting: Emergency Medicine

## 2016-10-23 ENCOUNTER — Emergency Department (HOSPITAL_COMMUNITY)
Admission: EM | Admit: 2016-10-23 | Discharge: 2016-10-24 | Disposition: A | Discharge status: Home / Self Care | Payer: Medicare Other | Attending: Emergency Medicine | Admitting: Emergency Medicine

## 2016-10-23 ENCOUNTER — Emergency Department (HOSPITAL_COMMUNITY): Payer: Medicare Other

## 2016-10-23 DIAGNOSIS — I6789 Other cerebrovascular disease: Secondary | ICD-10-CM | POA: Diagnosis not present

## 2016-10-23 DIAGNOSIS — I5032 Chronic diastolic (congestive) heart failure: Secondary | ICD-10-CM | POA: Insufficient documentation

## 2016-10-23 DIAGNOSIS — Y92009 Unspecified place in unspecified non-institutional (private) residence as the place of occurrence of the external cause: Secondary | ICD-10-CM | POA: Diagnosis not present

## 2016-10-23 DIAGNOSIS — S064X0A Epidural hemorrhage without loss of consciousness, initial encounter: Secondary | ICD-10-CM | POA: Diagnosis not present

## 2016-10-23 DIAGNOSIS — Z79899 Other long term (current) drug therapy: Secondary | ICD-10-CM | POA: Diagnosis not present

## 2016-10-23 DIAGNOSIS — Z87891 Personal history of nicotine dependence: Secondary | ICD-10-CM | POA: Diagnosis not present

## 2016-10-23 DIAGNOSIS — Z951 Presence of aortocoronary bypass graft: Secondary | ICD-10-CM | POA: Diagnosis not present

## 2016-10-23 DIAGNOSIS — Z7901 Long term (current) use of anticoagulants: Secondary | ICD-10-CM | POA: Diagnosis not present

## 2016-10-23 DIAGNOSIS — Y999 Unspecified external cause status: Secondary | ICD-10-CM | POA: Insufficient documentation

## 2016-10-23 DIAGNOSIS — Z8673 Personal history of transient ischemic attack (TIA), and cerebral infarction without residual deficits: Secondary | ICD-10-CM | POA: Diagnosis not present

## 2016-10-23 DIAGNOSIS — R0600 Dyspnea, unspecified: Secondary | ICD-10-CM | POA: Diagnosis not present

## 2016-10-23 DIAGNOSIS — I2510 Atherosclerotic heart disease of native coronary artery without angina pectoris: Secondary | ICD-10-CM | POA: Insufficient documentation

## 2016-10-23 DIAGNOSIS — Y939 Activity, unspecified: Secondary | ICD-10-CM | POA: Diagnosis not present

## 2016-10-23 DIAGNOSIS — E119 Type 2 diabetes mellitus without complications: Secondary | ICD-10-CM | POA: Insufficient documentation

## 2016-10-23 DIAGNOSIS — S0990XA Unspecified injury of head, initial encounter: Secondary | ICD-10-CM | POA: Diagnosis not present

## 2016-10-23 DIAGNOSIS — S0181XA Laceration without foreign body of other part of head, initial encounter: Secondary | ICD-10-CM | POA: Diagnosis not present

## 2016-10-23 DIAGNOSIS — S299XXA Unspecified injury of thorax, initial encounter: Secondary | ICD-10-CM | POA: Diagnosis not present

## 2016-10-23 DIAGNOSIS — W1839XA Other fall on same level, initial encounter: Secondary | ICD-10-CM | POA: Insufficient documentation

## 2016-10-23 DIAGNOSIS — W19XXXA Unspecified fall, initial encounter: Secondary | ICD-10-CM

## 2016-10-23 DIAGNOSIS — I110 Hypertensive heart disease with heart failure: Secondary | ICD-10-CM | POA: Insufficient documentation

## 2016-10-23 DIAGNOSIS — J45909 Unspecified asthma, uncomplicated: Secondary | ICD-10-CM | POA: Insufficient documentation

## 2016-10-23 DIAGNOSIS — S098XXA Other specified injuries of head, initial encounter: Secondary | ICD-10-CM | POA: Diagnosis not present

## 2016-10-23 LAB — CBC WITH DIFFERENTIAL/PLATELET
BASOS ABS: 0 10*3/uL (ref 0.0–0.1)
BASOS PCT: 0 %
EOS ABS: 0.1 10*3/uL (ref 0.0–0.7)
EOS PCT: 1 %
HCT: 46.1 % — ABNORMAL HIGH (ref 36.0–46.0)
HEMOGLOBIN: 15 g/dL (ref 12.0–15.0)
LYMPHS ABS: 4.4 10*3/uL — AB (ref 0.7–4.0)
Lymphocytes Relative: 48 %
MCH: 32.7 pg (ref 26.0–34.0)
MCHC: 32.5 g/dL (ref 30.0–36.0)
MCV: 100.4 fL — ABNORMAL HIGH (ref 78.0–100.0)
Monocytes Absolute: 0.5 10*3/uL (ref 0.1–1.0)
Monocytes Relative: 6 %
NEUTROS PCT: 45 %
Neutro Abs: 4.1 10*3/uL (ref 1.7–7.7)
PLATELETS: 140 10*3/uL — AB (ref 150–400)
RBC: 4.59 MIL/uL (ref 3.87–5.11)
RDW: 12.9 % (ref 11.5–15.5)
WBC: 9.1 10*3/uL (ref 4.0–10.5)

## 2016-10-23 LAB — BASIC METABOLIC PANEL
ANION GAP: 11 (ref 5–15)
BUN: 13 mg/dL (ref 6–20)
CALCIUM: 8.9 mg/dL (ref 8.9–10.3)
CO2: 26 mmol/L (ref 22–32)
Chloride: 102 mmol/L (ref 101–111)
Creatinine, Ser: 0.95 mg/dL (ref 0.44–1.00)
GFR, EST AFRICAN AMERICAN: 59 mL/min — AB (ref >60)
GFR, EST NON AFRICAN AMERICAN: 51 mL/min — AB (ref >60)
Glucose, Bld: 167 mg/dL — ABNORMAL HIGH (ref 65–99)
Potassium: 3.3 mmol/L — ABNORMAL LOW (ref 3.5–5.1)
SODIUM: 139 mmol/L (ref 135–145)

## 2016-10-23 LAB — PROTIME-INR
INR: 2.03
PROTHROMBIN TIME: 23.3 s — AB (ref 11.4–15.2)

## 2016-10-23 LAB — I-STAT TROPONIN, ED: TROPONIN I, POC: 0.01 ng/mL (ref 0.00–0.08)

## 2016-10-23 LAB — BRAIN NATRIURETIC PEPTIDE: B NATRIURETIC PEPTIDE 5: 359.4 pg/mL — AB (ref 0.0–100.0)

## 2016-10-23 NOTE — ED Provider Notes (Signed)
[facility] DEPT Provider Note   CSN: [id number] Arrival date & time: 10/23/16  2224     History   Chief Complaint Chief Complaint  Patient presents with  . Fall    HPI Rebecca Skinner is a 83 y.o. female.  HPI Patient reports she is trying to stand up and walk and she fell. She did hit her head but she is denying pain or headache. She is reportedly on blood thinners for atrial fibrillation. The patient denies that she has any other area of injury or pain. EMS reports however on arrival she was audibly wheezing and started nebulizer treatment. Patient has seen before arrival of family members. EMS notes daughter reported wheezing is normal. Patient denies chest pain. Past Medical History:  Diagnosis Date  . Anxiety 11/20/2011  . Asthma    [facility] DR PAZ  . CAD (coronary artery disease)    s/p CABG s/p AO valve replacement ([place])  . CVA (cerebral infarction) 08-2013   multiple, L, d/t Afib, started eloquis  . Diabetes mellitus    type 2   . Dizziness    Chronic, admiet 07-2010,saw neuro, thought to be a peripheral issue   . Dry skin   . GERD (gastroesophageal reflux disease)   . Hyperlipidemia   . Hypertension   . Hyperthyroidism   . Keloid    @ chest  . Memory loss   . Osteoarthritis   . Osteopenia    per dexa 12/09  . Paroxysmal atrial fibrillation ([place])    cards d/c coumadin 12/2008 d/t persisten NSR and frequent falls- restarted coumadin july 2011, now on Eliquis  . Recurrent UTI   . Shortness of breath dyspnea   . TIA (transient ischemic attack)     Patient Active Problem List   Diagnosis Date Noted  . TIA (transient ischemic attack) 08/30/2016  . Acute kidney injury ([place]) 03/05/2016  . Diabetes mellitus with complication ([place])   . Exposure keratitis 09/23/2015  . Keratopathy, band 09/13/2015  . PCP NOTES >>>>> 04/05/2015  . Chronic diastolic heart failure ([place]) 12/29/2014  . Edema 12/20/2014  . DM type 2 (diabetes mellitus, type 2)  ([place]) 12/20/2014  . Dyspnea 12/20/2014  . Chronic atrial fibrillation ([place]) 05/12/2014  . Angiectasia 03/30/2014  . GI bleed 03/28/2014  . Mild cognitive impairment 09/16/2013  . Annual physical exam 11/21/2011  . ATRIAL FIBRILLATION, chronic 03/05/2007  . Diabetes mellitus with circulatory complication ([place]) 40/98/1191  . S/P AVR (aortic valve replacement) 10/15/2006  . Hyperthyroidism 09/03/2006  . Dyslipidemia 09/03/2006  . HTN (hypertension) 09/03/2006  . CAD (coronary artery disease) of artery bypass graft 09/03/2006  . Asthma, chronic 09/03/2006    Past Surgical History:  Procedure Laterality Date  . ABDOMINAL HYSTERECTOMY    . AORTIC VALVE REPLACEMENT  1998  . CARDIAC VALVE REPLACEMENT    . CAROTID DOPPLER  07/13/12   BILATERAL BULB/PROXIMAL ICAS;MILD AMOUNT OF FIBROUS PLAQUE WITH NO DIAMETER REDUCTION.  . CESAREAN SECTION     x 2  . COLONOSCOPY WITH PROPOFOL N/A 03/30/2014   Procedure: COLONOSCOPY WITH PROPOFOL;  Surgeon: Irene Shipper, MD;  Location: [facility] END[facility]COPY;  Service: Endoscopy;  Laterality: N/A;  . CORONARY ARTERY BYPASS GRAFT  1998   SVG TO RCA  . HEMORRHOID SURGERY    . JOINT REPLACEMENT    . MYOCARDIAL PERFUSION STUDY  12/19/09   NORMAL PATTERN OF PERFUSION IN ALL REGIONS.EF 75%.  . OOPHORECTOMY    . TOTAL KNEE ARTHROPLASTY  [place] ECHOCARDIOGRAM  09/11/11   LVEF >55%.STAGE 1 DIASTOLIC DYSFUNCTION,ELEVATED LV FILLING PRESSURE.BIOPROSTHETIC AORTIC VALVE-PEAK AND MEAN GRADIENTS OF 17 MMHG AND 8 MMHG.SIGMOID SEPTUM.MILD TO MOD MR.MILD TO MOD TR.RVSP 46 MMHG.    OB History    Gravida Para Term Preterm AB Living   4 4           SAB TAB Ectopic Multiple Live Births                   Home Medications    Prior to Admission medications   Medication Sig Start Date End Date Taking? Authorizing Provider  acetaminophen (TYLENOL) 500 MG tablet Take 1,000 mg by mouth every 6 (six) hours as needed for moderate pain (pain).   Yes [provider]  apixaban (ELIQUIS) 5 MG TABS tablet Take 1 tablet (5 mg total) by mouth 2 (two) times daily. 09/03/16  Yes Paz, Alda Berthold, MD  artificial tears (LACRILUBE) OINT ophthalmic ointment Place into both eyes at bedtime as needed for dry eyes. 09/23/15  Yes Robyn Haber, MD  atorvastatin (LIPITOR) 40 MG tablet Take 1 tablet (40 mg total) by mouth at bedtime. 07/18/16  Yes Paz, Alda Berthold, MD  benazepril (LOTENSIN) 20 MG tablet TAKE 1 TABLET BY MOUTH DAILY 08/15/16  Yes Troy Sine, MD  budesonide (PULMICORT) 0.5 MG/2ML nebulizer solution USE 1 VIAL VIA NEBULIZER TWICE A DAY 06/06/16  Yes Paz, Alda Berthold, MD  Calcium Carbonate (CALCIUM 500 PO) Take 500 mg by mouth at bedtime.    Yes [provider]  cholecalciferol (VITAMIN D) 1000 UNITS tablet Take 1,000 Units by mouth at bedtime.    Yes [provider]  diltiazem (CARDIZEM CD) 120 MG 24 hr capsule Take 1 capsule (120 mg total) by mouth daily. 08/20/16 11/18/16 Yes Troy Sine, MD  dorzolamide-timolol (COSOPT) 22.3-6.8 MG/ML ophthalmic solution Place 1 drop into both eyes 2 (two) times daily.  03/23/14  Yes [provider]  esomeprazole (NEXIUM) 40 MG capsule Take 1 capsule (40 mg total) by mouth daily before breakfast. 09/16/16  Yes Paz, Alda Berthold, MD  furosemide (LASIX) 20 MG tablet Take 2 tablets (40 mg total) by mouth daily. 08/14/16  Yes Paz, Alda Berthold, MD  levalbuterol Penne Lash) 0.63 MG/3ML nebulizer solution Take 3 mLs (0.63 mg total) by nebulization 2 (two) times daily. 11/10/15  Yes Paz, Alda Berthold, MD  magnesium oxide (MAG-OX) 400 (241.3 Mg) MG tablet Take 1 tablet (400 mg total) by mouth at bedtime. 08/31/16  Yes Hongalgi, Lenis Dickinson, MD  methimazole (TAPAZOLE) 5 MG tablet Take 1 tablet (5 mg total) by mouth 3 (three) times a week. 09/27/16  Yes Paz, Alda Berthold, MD  metoprolol tartrate (LOPRESSOR) 25 MG tablet Take 1 tablet (25 mg total) by mouth 2 (two) times daily. 05/14/16  Yes Paz, Alda Berthold, MD  nitroGLYCERIN (NITROSTAT) 0.4 MG SL tablet Place  0.4 mg under the tongue every 5 (five) minutes as needed for chest pain (chest pain). Reported on 11/10/2015   Yes [provider]  potassium chloride (KLOR-CON M10) 10 MEQ tablet Take 1 tablet (10 mEq total) by mouth at bedtime. 08/31/16  Yes Hongalgi, Lenis Dickinson, MD  RESTASIS MULTIDOSE 0.05 % ophthalmic emulsion Place 1 drop into both eyes daily.  05/13/16  Yes [provider]  TRAVATAN Z 0.004 % SOLN ophthalmic solution Place 1 drop into both eyes at bedtime.  06/16/13  Yes [provider]    Family History Family  History  Problem Relation Age of Onset  . Heart attack Father   . Coronary artery disease Son 18       deceased  . Hypertension Brother   . Stroke Brother   . Colon cancer Neg Hx   . Breast cancer Neg Hx   . Thyroid disease Neg Hx     Social History Social History  Substance Use Topics  . Smoking status: Former Research scientist (life sciences)  . Smokeless tobacco: Never Used     Comment: quit 2004, smoked 1.5 ppd  . Alcohol use No     Allergies   Hydrocodone and Tramadol hcl   Review of Systems Review of Systems 10 Systems reviewed and are negative for acute change except as noted in the HPI.   Physical Exam Updated Vital Signs BP 116/88   Pulse 87   Temp 98.1 F (36.7 C) (Oral)   Resp (!) 27   Ht 5\' 2"  (1.575 m)   Wt 168 lb (76.2 kg)   SpO2 95%   BMI 30.73 kg/m   Physical Exam  Constitutional:  Patient is alert and appropriate. She is getting a nebulizer treatment on arrival. Mild increased work of breathing.  HENT:  Right Ear: External ear normal.  Left Ear: External ear normal.  Nose: Nose normal.  Mouth/Throat: Oropharynx is clear and moist.  1 cm contusion right parietal scalp. No laceration or abrasion.  Eyes: EOM are normal.  Neck: Neck supple.  Cardiovascular: Intact distal pulses.   Normal rate irregularly irregular.  Pulmonary/Chest:  Mild increased work of breathing. Speaking in full sentences. Scattered fine expiratory wheeze.  Adequate air flow  Abdominal: Soft. She exhibits no distension. There is no tenderness.  Musculoskeletal: Normal range of motion.  Patient can move all extremities at will and by command. She reports she has some chronic problems with pain in the right shoulder but has use of her upper extremity. No acute deformities. She will move each lower extremity on command. Denies any pain in the hips or the knees. Trace edema bilaterally.  Neurological: She is alert. No cranial nerve deficit. She exhibits normal muscle tone. Coordination normal.  Skin: Skin is warm and dry.  Psychiatric: She has a normal mood and affect.     ED Treatments / Results  Labs (all labs ordered are listed, but only abnormal results are displayed) Labs Reviewed  BASIC METABOLIC PANEL - Abnormal; Notable for the following:       Result Value   Potassium 3.3 (*)    Glucose, Bld 167 (*)    GFR calc non Af Amer 51 (*)    GFR calc Af Amer 59 (*)    All other components within normal limits  BRAIN NATRIURETIC PEPTIDE - Abnormal; Notable for the following:    B Natriuretic Peptide 359.4 (*)    All other components within normal limits  CBC WITH DIFFERENTIAL/PLATELET - Abnormal; Notable for the following:    HCT 46.1 (*)    MCV 100.4 (*)    Platelets 140 (*)    Lymphs Abs 4.4 (*)    All other components within normal limits  PROTIME-INR - Abnormal; Notable for the following:    Prothrombin Time 23.3 (*)    All other components within normal limits  I-STAT TROPOININ, ED    EKG  EKG Interpretation None       Radiology Ct Head Wo Contrast  Result Date: 10/23/2016 CLINICAL DATA:  Patient fell at home today with laceration to the right side  of the head. EXAM: CT HEAD WITHOUT CONTRAST TECHNIQUE: Contiguous axial images were obtained from the base of the skull through the vertex without intravenous contrast. COMPARISON:  CT from 08/30/2016 FINDINGS: Brain: Chronic stable areas of encephalomalacia in both frontal and  right parietal lobes consistent with chronic infarcts in the MCA territory. Chronic moderate small vessel ischemia of periventricular deep white matter. No acute intracranial hemorrhage, midline shift or edema. No extra-axial fluid collections. Vascular: No hyperdense vessel or unexpected calcification. Carotid siphon calcifications are noted bilaterally. Skull: Negative for fracture or focal lesion. Sinuses/Orbits: No acute finding. Other: None. IMPRESSION: No acute intracranial abnormality. Chronic bilateral MCA distribution infarcts and moderate chronic small vessel ischemia. Electronically Signed   By: Ashley Royalty M.D.   On: 10/23/2016 23:41   Dg Chest Port 1 View  Result Date: 10/23/2016 CLINICAL DATA:  Dyspnea.  Fall today. EXAM: PORTABLE CHEST 1 VIEW COMPARISON:  Frontal and lateral views 08/30/2016 FINDINGS: Post median sternotomy with prosthetic aortic valve. Lower lung volumes from prior exam. Cardiomegaly is stable allowing for differences in technique. There is atherosclerosis of the thoracic aorta. Minimal bibasilar atelectasis, no confluent airspace disease. Stable right midlung scarring, partially obscured. No large pleural effusion or pneumothorax. No acute osseous abnormalities are seen. IMPRESSION: Stable cardiomegaly allowing for lower lung volumes. Mild bibasilar atelectasis. Thoracic aortic atherosclerosis. Electronically Signed   By: Jeb Levering M.D.   On: 10/23/2016 23:03    Procedures Procedures (including critical care time)  Medications Ordered in ED Medications - No data to display   Initial Impression / Assessment and Plan / ED Course  I have reviewed the triage vital signs and the nursing notes.  Pertinent labs & imaging results that were available during my care of the patient were reviewed by me and considered in my medical decision making (see chart for details).     Final Clinical Impressions(s) / ED Diagnoses   Final diagnoses:  Fall, initial encounter    Injury of head, initial encounter  Patient is alert and appropriate. CT shows no signs of intracranial injury. She does not have other complaints. She feels that her breathing is at baseline. She has not showing respiratory distress now. Patient's daughters at bedside. She reports she is at normal baseline for respiratory status and mental status. Return precautions reviewed.  New Prescriptions New Prescriptions   No medications on file     Charlesetta Shanks, MD 10/24/16 [phone]

## 2016-10-23 NOTE — ED Triage Notes (Signed)
Per EMS, patient was at home and was trying to stand at walker and ended up falling. Small knot/laceration to right side of head.  Wheezing bilaterally.  Hit head on floor.  Hx of afib, TIAs, MI.  Chronic right shoulder pain.  Daughter states wheezing is normal.  On blood thinners.  12.5 Albuterol, 1.0 Atrovent and 125 solumedrol given en route.  162/84, 74 HR, 95% RA. Afib on monitor.

## 2016-10-24 NOTE — ED Notes (Signed)
Pt departed in NAD.  

## 2016-11-30 ENCOUNTER — Other Ambulatory Visit: Payer: Self-pay | Admitting: Internal Medicine

## 2016-12-10 ENCOUNTER — Ambulatory Visit (INDEPENDENT_AMBULATORY_CARE_PROVIDER_SITE_OTHER): Payer: Medicare Other | Admitting: Internal Medicine

## 2016-12-10 ENCOUNTER — Encounter: Payer: Self-pay | Admitting: Internal Medicine

## 2016-12-10 VITALS — BP 126/72 | HR 71 | Temp 97.6°F | Resp 14 | Ht 62.0 in | Wt 168.5 lb

## 2016-12-10 DIAGNOSIS — G458 Other transient cerebral ischemic attacks and related syndromes: Secondary | ICD-10-CM

## 2016-12-10 DIAGNOSIS — F0390 Unspecified dementia without behavioral disturbance: Secondary | ICD-10-CM | POA: Diagnosis not present

## 2016-12-10 DIAGNOSIS — I10 Essential (primary) hypertension: Secondary | ICD-10-CM

## 2016-12-10 LAB — BASIC METABOLIC PANEL
BUN: 13 mg/dL (ref 6–23)
CHLORIDE: 100 meq/L (ref 96–112)
CO2: 34 mEq/L — ABNORMAL HIGH (ref 19–32)
Calcium: 9.6 mg/dL (ref 8.4–10.5)
Creatinine, Ser: 1.01 mg/dL (ref 0.40–1.20)
GFR: 65.94 mL/min (ref >60.00)
Glucose, Bld: 120 mg/dL — ABNORMAL HIGH (ref 70–99)
POTASSIUM: 4.2 meq/L (ref 3.5–5.1)
Sodium: 140 mEq/L (ref 135–145)

## 2016-12-10 MED ORDER — KETOCONAZOLE 2 % EX CREA: 1.0000 "application " | TOPICAL_CREAM | Freq: Every day | CUTANEOUS | 0 refills | Status: DC

## 2016-12-10 MED ORDER — DONEPEZIL HCL 5 MG PO TABS: 5.0000 mg | ORAL_TABLET | Freq: Every day | ORAL | 11 refills | Status: DC

## 2016-12-10 MED ORDER — ALPRAZOLAM 0.25 MG PO TABS: ORAL_TABLET | ORAL | 0 refills | Status: DC

## 2016-12-10 NOTE — Progress Notes (Signed)
Pre visit review using our clinic review tool, if applicable. No additional management support is needed unless otherwise documented below in the visit note. 

## 2016-12-10 NOTE — Progress Notes (Signed)
Subjective:    Patient ID: Rebecca Skinner, female    DOB: 09-20-1922, 83 y.o.   MRN: [id number]  DOS:  12/10/2016 Type of visit - description : rov Interval history: -Went to the ER 10/23/2016, records reviewed: Had a fall, CT head showed no intracranial injury. Labs okay, BNP elevated but  better than before -Note from neurology and cardiology reviewed -Continue with difficulty with memory, daughter would like to see about medication. -Insomnia: Still an issue sporadically. Refill alprazolam?. -Has developed a rash under abdominal folds.  Review of Systems Since the time she went to the ER no other falls. No headache or neck pain. No nausea, vomiting. No blood in the stools.  Past Medical History:  Diagnosis Date  . Anxiety 11/20/2011  . Asthma    UNDER [facility] DR Taniyah Ballow  . CAD (coronary artery disease)    s/p CABG s/p AO valve replacement ([facility] [facility])  . CVA (cerebral infarction) 08-2013   multiple, L, d/t Afib, started eloquis  . Diabetes mellitus    type 2   . Dizziness    Chronic, admiet 07-2010,saw neuro, thought to be a peripheral issue   . Dry skin   . GERD (gastroesophageal reflux disease)   . Hyperlipidemia   . Hypertension   . Hyperthyroidism   . Keloid    @ chest  . Memory loss   . Osteoarthritis   . Osteopenia    per dexa 12/09  . Paroxysmal atrial fibrillation ([place])    cards d/c coumadin 12/2008 d/t persisten NSR and frequent falls- restarted coumadin july 2011, now on Eliquis  . Recurrent UTI   . Shortness of breath dyspnea   . TIA (transient ischemic attack)     Past Surgical History:  Procedure Laterality Date  . ABDOMINAL HYSTERECTOMY    . AORTIC VALVE REPLACEMENT  1998  . CARDIAC VALVE REPLACEMENT    . CAROTID DOPPLER  07/13/12   BILATERAL BULB/PROXIMAL ICAS;MILD AMOUNT OF FIBROUS PLAQUE WITH NO DIAMETER REDUCTION.  . CESAREAN SECTION     x 2  . COLONOSCOPY WITH PROPOFOL N/A 03/30/2014   Procedure: COLONOSCOPY WITH PROPOFOL;  Surgeon: Irene Shipper, MD;  Location: [facility];  Service: Endoscopy;  Laterality: N/A;  . CORONARY ARTERY BYPASS GRAFT  1998   SVG TO RCA  . HEMORRHOID SURGERY    . JOINT REPLACEMENT    . MYOCARDIAL PERFUSION STUDY  12/19/09   NORMAL PATTERN OF PERFUSION IN ALL REGIONS.EF 75%.  . OOPHORECTOMY    . TOTAL KNEE ARTHROPLASTY  1999  . TRANSTHORACIC ECHOCARDIOGRAM  09/11/11   LVEF >55%.STAGE 1 DIASTOLIC DYSFUNCTION,ELEVATED LV FILLING PRESSURE.BIOPROSTHETIC AORTIC VALVE-PEAK AND MEAN GRADIENTS OF 17 MMHG AND 8 MMHG.SIGMOID SEPTUM.MILD TO MOD MR.MILD TO MOD TR.RVSP 46 MMHG.    Social History   Social History  . Marital status: Divorced    Spouse name: N/A  . Number of children: 4  . Years of education: N/A   Occupational History  . retired  Retired   Social History Main Topics  . Smoking status: Former Research scientist (life sciences)  . Smokeless tobacco: Never Used     Comment: quit 2004, smoked 1.5 ppd  . Alcohol use No  . Drug use: No  . Sexual activity: Not on file   Other Topics Concern  . Not on file   Social History Narrative   Had to moved w/ her daughter [name])  ~ 01-2016   Had 3 daughter- 2 son  (lost oldest daughter  and son), 2 living daughters in [place] as of 12/10/2016      Reactions   Hydrocodone Itching, Nausea Only   Tramadol Hcl Itching, Nausea Only      Medication List       Accurate as of 12/10/16 11:21 AM. Always use your most recent med list.          acetaminophen 500 MG tablet Commonly known as:  TYLENOL Take 1,000 mg by mouth every 6 (six) hours as needed for moderate pain (pain).   apixaban 5 MG Tabs tablet Commonly known as:  ELIQUIS Take 1 tablet (5 mg total) by mouth 2 (two) times daily.   artificial tears Oint ophthalmic ointment Place into both eyes at bedtime as needed for dry eyes.   atorvastatin 40 MG tablet Commonly known as:  LIPITOR Take 1 tablet (40 mg total) by mouth at bedtime.   benazepril 20 MG tablet Commonly known as:   LOTENSIN TAKE 1 TABLET BY MOUTH DAILY   budesonide 0.5 MG/2ML nebulizer solution Commonly known as:  PULMICORT USE 1 VIAL VIA NEBULIZER TWICE A DAY   CALCIUM 500 PO Take 500 mg by mouth at bedtime.   cholecalciferol 1000 units tablet Commonly known as:  VITAMIN D Take 1,000 Units by mouth at bedtime.   diltiazem 120 MG 24 hr capsule Commonly known as:  CARDIZEM CD Take 1 capsule (120 mg total) by mouth daily.   dorzolamide-timolol 22.3-6.8 MG/ML ophthalmic solution Commonly known as:  COSOPT Place 1 drop into both eyes 2 (two) times daily.   esomeprazole 40 MG capsule Commonly known as:  NEXIUM Take 1 capsule (40 mg total) by mouth daily before breakfast.   furosemide 20 MG tablet Commonly known as:  LASIX Take 2 tablets (40 mg total) by mouth daily.   levalbuterol 0.63 MG/3ML nebulizer solution Commonly known as:  XOPENEX Take 3 mLs (0.63 mg total) by nebulization 2 (two) times daily.   magnesium oxide 400 (241.3 Mg) MG tablet Commonly known as:  MAG-OX Take 1 tablet (400 mg total) by mouth at bedtime.   methimazole 5 MG tablet Commonly known as:  TAPAZOLE Take 1 tablet (5 mg total) by mouth 3 (three) times a week.   metoprolol tartrate 25 MG tablet Commonly known as:  LOPRESSOR Take 1 tablet (25 mg total) by mouth 2 (two) times daily.   nitroGLYCERIN 0.4 MG SL tablet Commonly known as:  NITROSTAT Place 0.4 mg under the tongue every 5 (five) minutes as needed for chest pain (chest pain). Reported on 11/10/2015   potassium chloride 10 MEQ tablet Commonly known as:  KLOR-CON M10 Take 1 tablet (10 mEq total) by mouth at bedtime.   RESTASIS MULTIDOSE 0.05 % ophthalmic emulsion Generic drug:  cycloSPORINE Place 1 drop into both eyes daily.   TRAVATAN Z 0.004 % Soln ophthalmic solution Generic drug:  Travoprost (BAK Free) Place 1 drop into both eyes at bedtime.          Objective:   Physical Exam BP 126/72 (BP Location: Left Arm, Patient Position: Sitting,  Cuff Size: Normal)   Pulse 71   Temp 97.6 F (36.4 C) (Oral)   Resp 14   Ht 5\' 2"  (1.575 m)   Wt 168 lb 8 oz (76.4 kg)   SpO2 95%   BMI 30.82 kg/m  General:   Well developed, well nourished . NAD.  HEENT:  Normocephalic . Face symmetric, atraumatic Skin:  She has mild  maceration and the abdominal falls, worse on the left. No swelling or cellulitis type of changes. Neurologic:  alert & pleasent Speech normal, gait assisted by a walker MMSE:  Between 12-15. Results skewed by poor  (unble to read, write or copy) Psych--  Cognition and judgment appear intact.  Cooperative with normal attention span and concentration.  Behavior appropriate. No anxious or depressed appearing.      Assessment & Plan:   Assessment DM HTN Hyperlipidemia Hyperthyroidism -- used to see Dr. Loanne Drilling, last visit 01-2016, now f/u PCP, check labs x 2 q year GERD Asthma  Anxiety on xanax  CV: Dr. Claiborne Billings --CHF, CAD, CABG, Aortic valve replacement --P- atrial fibrillation, used to be on Coumadin, Eliquis rx after the stroke 3-15, d/c 03-2014 d/t GI bleed , on ASA 81. Dx TIA 08-2016, back on eliquis, asa d/c NEURO --TIAs (admited 08-2016), CVA 2015 --Dementia MMSE ~ 12 to 15  (12-2016) DJD Osteopenia 2009 Recurrent UTIs Daughter  Fay Bagg; lives w/ [name]  PLAN HTN: Seems well-controlled, check a BMP Dementia: At least moderate,MMSE today ~ 12-15 ,family would like treatment. Start Aricept, watch for side effects. Insomnia: Occasionally has a restless night, alprazolam worked well for her before, daughter request a rf >>> done, strongly warned her about s/e including somnolence and increased risk of falls. Rash: At the abdominal folds. Continue using powder, prescribed ketoconazole. Call if no better. TIA, CVA: Saw neurology, note reviewed, the agreed to continue eliquis and strict control of CV RF.  A. fib, CAD, CHF, aVR: Saw cardiology 09-2016, they did discuss anticoagulation and elected to continue with  eliquis. Request a RF on NTG. Will do RTC 3 months   Today, I spent more than  25  min with the patient: >50% of the time counseling regards pros and cons of Aricept, Xanax .

## 2016-12-10 NOTE — Patient Instructions (Signed)
GO TO THE LAB : Get the blood work     GO TO THE FRONT DESK Schedule your next appointment for a  checkup in 3 months  Take Aricept 1 tablet daily to help with memory. Call if side effects.  Okay to use Xanax sporadically for difficulty sleeping. Watch for excessive somnolence

## 2016-12-11 NOTE — Assessment & Plan Note (Signed)
HTN: Seems well-controlled, check a BMP Dementia: At least moderate,MMSE today ~ 12-15 ,family would like treatment. Start Aricept, watch for side effects. Insomnia: Occasionally has a restless night, alprazolam worked well for her before, daughter request a rf >>> done, strongly warned her about s/e including somnolence and increased risk of falls. Rash: At the abdominal folds. Continue using powder, prescribed ketoconazole. Call if no better. TIA, CVA: Saw neurology, note reviewed, the agreed to continue eliquis and strict control of CV RF.  A. fib, CAD, CHF, aVR: Saw cardiology 09-2016, they did discuss anticoagulation and elected to continue with eliquis. Request a RF on NTG. Will do RTC 3 months

## 2016-12-13 MED ORDER — NITROGLYCERIN 0.4 MG SL SUBL: 0.4000 mg | SUBLINGUAL_TABLET | SUBLINGUAL | 3 refills | Status: DC | PRN

## 2016-12-13 NOTE — Addendum Note (Signed)
Addended byDamita Dunnings D on: 12/13/2016 04:29 PM   Modules accepted: Orders

## 2016-12-17 ENCOUNTER — Ambulatory Visit (INDEPENDENT_AMBULATORY_CARE_PROVIDER_SITE_OTHER): Payer: Medicare Other | Admitting: Cardiovascular Disease

## 2016-12-17 ENCOUNTER — Encounter: Payer: Self-pay | Admitting: Cardiovascular Disease

## 2016-12-17 VITALS — BP 170/81 | HR 66 | Ht 62.0 in | Wt 166.8 lb

## 2016-12-17 DIAGNOSIS — E785 Hyperlipidemia, unspecified: Secondary | ICD-10-CM | POA: Diagnosis not present

## 2016-12-17 DIAGNOSIS — I10 Essential (primary) hypertension: Secondary | ICD-10-CM

## 2016-12-17 DIAGNOSIS — I482 Chronic atrial fibrillation, unspecified: Secondary | ICD-10-CM

## 2016-12-17 DIAGNOSIS — Z7901 Long term (current) use of anticoagulants: Secondary | ICD-10-CM

## 2016-12-17 DIAGNOSIS — Z952 Presence of prosthetic heart valve: Secondary | ICD-10-CM

## 2016-12-17 DIAGNOSIS — Z8673 Personal history of transient ischemic attack (TIA), and cerebral infarction without residual deficits: Secondary | ICD-10-CM

## 2016-12-17 NOTE — Progress Notes (Signed)
Patient ID: Rebecca Skinner, female   DOB: 1922/06/23, 83 y.o.   MRN: [id number]      HPI: [name] is an 83 year old female who  established cardiology care with me in February 2015. She is a former patient of Dr. Rollene Fare.  She presents for a three-month follow-up cardiology evaluation  Rebecca Skinner underwent a pericardial aortic valve replacement in 1998 and single vessel bypass with a vein graft to the right artery artery. She also has a history of sick sinus syndrome with paroxysmal atrial fibrillation and was taken off Coumadin anticoagulation in the past due to multiple falls in 2009 in 2010. There is no history of CVA or TIAs. She does have significant arthritic issues with back discomfort and also arthritis of her knees. She last saw Dr. Rollene Fare one year ago and at that time, she was in a normal sinus rhythm.  In 2015  Rebecca Skinner had noticed increasing palpitations as well as chest fluttering. She has significant shortness of breath almost all the time. She currently is living with her grandson. She does note some wheezing. Her last nuclear perfusion study was in 2011 which was normal. Her last echo Doppler study was in 2013 which showed moderate asymmetric LVH with septal wall at 1.6 cm and posterior wall 1.1 cm of the sigmoid septum. Ejection fraction was greater than 55%. She had mild to moderate mitral regurgitation, mild to moderate tricuspid regurgitation with elevation of RV systolic pressure at 46 mm. There bioprosthetic aortic valve had a peak gradient of 17 and mean gradient of 8 within normal limits for this valve. On 08/12/2013 she underwent a nuclear perfusion study which was interpreted as intermediate risk with it suggested a fixed lateral defect with minimal peri-infarction ischemia. A CardioNet monitor had shown atrial fibrillation with bursts of increased heart rate.  She walks with a walker. She has undergone left knee replacement. She has history of diabetes mellitus as well as  hypertension. She had been on dual antiplatelet therapy with aspirin and Plavix. She was hospitalized on on the neurology service from 3/20 - 24, 2015 with CVA. Her symptoms have resolved. Carotid studies demonstrated mild internal carotid stenoses of less than 40%. During that evaluation, she apparently was taken off aspirin and Plavix by Dr. [name] and was given a prescription for Eliquis  5 mg since was felt that her CVA was due to atrial fibrillation.  She presented to [facility] emergency room on 03/28/2014 with complaints of rectal bleeding for approximately a week.  She was found to have significant anemia with a hemoglobin of 7.4 and hematocrit of 22.2.  She was treated with 3 units of packed red blood cells during her admission.  Eliquis and aspirin were held, and she is not been on this since. And was restarted back on aspirin.  She underwent colonoscopy and was found to have an AVM in the cecum area, which was felt to be the cause of her lower extremity bleeding.  n July 2016 a follow-up echo Doppler study showed an EF of 60-65%.  There was grade 2 diastolic dysfunction.  Her bioprosthetic aortic valve was well-seated and functioning normally.  Mean gradient was 5.  There was moderate RA dilatation and severe TR with increased.  PA pressure at 63 mm.  When I saw her on July 11, 2016, she had  very slow atrial fibrillation at 45 beats and suggested a junctional rhythm.  At that time, she had been on diltiazem 120 mg twice  a day, and this was weaned and ultimately discontinued.  Subsequently , she  resumed taking just 120 mg daily.   A repeat echo Doppler study on 08/06/2016 showed an EF of 65-70%.  His aortic bioprosthesis was functioning well with a mean gradient of 6.  Her left atrium was moderately dilated.  There was mild MR, but she had evidence for severe tricuspid regurgitation with moderate pulmonary hypertension with estimated systolic pressure at 51 mm.  She also has diabetes mellitus,  as well as thyroid disease.    She was hospitalized overnight on 08/30/2016, she developed a aphasia and became confused.  Deficits resolved within an hour and were gone by the time she had arrived in the emergency room.  She was felt to have had a TIA.  An MRI did not reveal any acute infarct.  An MRA showed moderate to severe stenosis at the origen of the left SMA anterior M2 branch.  A CT of her head showed chronic bilateral MCA infarcts.  Carotid Doppler study showed 1-39% ICA stenosis with antegrade vertebral flow.  She was seen by Dr. Collie Siad of neurology, although she had issues with GI bleeds in the past felt that her AVMs were treated, and although there was some risk with reinstitution of anticoagulation she was started on eliquis 5 mg twice a day.  Since I last saw her in April 2018.  She denies any recurrent neurologic symptoms.  She is on anticoagulation with eloquence and denies bleeding.  She has atrial fibrillation and denies any sensation of increasing heart rate.  She recently slid out of bed, but did not fall hard.  She is aware of fall risk with anticoagulation risk of bleeding versus recurrent neurologic events, if not anticoagulated.  She denies any recurrent symptomatology.  She denies any chest pain.  She presents today for evaluation.  Past Medical History:  Diagnosis Date  . Anxiety 11/20/2011  . Asthma    UNDER THE CARE [facility] DR PAZ  . CAD (coronary artery disease)    s/p CABG s/p AO valve replacement ([place] [facility])  . CVA (cerebral infarction) 08-2013   multiple, L, d/t Afib, started eloquis  . Diabetes mellitus    type 2   . Dizziness    Chronic, admiet 07-2010,saw neuro, thought to be a peripheral issue   . Dry skin   . GERD (gastroesophageal reflux disease)   . Hyperlipidemia   . Hypertension   . Hyperthyroidism   . Keloid    @ chest  . Memory loss   . Osteoarthritis   . Osteopenia    per dexa 12/09  . Paroxysmal atrial fibrillation ([place])    cards d/c coumadin  12/2008 d/t persisten NSR and frequent falls- restarted coumadin july 2011, now on Eliquis  . Recurrent UTI   . Shortness of breath dyspnea   . TIA (transient ischemic attack)     Past Surgical History:  Procedure Laterality Date  . ABDOMINAL HYSTERECTOMY    . AORTIC VALVE REPLACEMENT  1998  . CARDIAC VALVE REPLACEMENT    . CAROTID DOPPLER  07/13/12   BILATERAL BULB/PROXIMAL ICAS;MILD AMOUNT OF FIBROUS PLAQUE WITH NO DIAMETER REDUCTION.  . CESAREAN SECTION     x 2  . COLONOSCOPY WITH PROPOFOL N/A 03/30/2014   Procedure: COLONOSCOPY WITH PROPOFOL;  Surgeon: Irene Shipper, MD;  Location: [facility];  Service: Endoscopy;  Laterality: N/A;  . CORONARY ARTERY BYPASS GRAFT  1998   SVG TO RCA  . HEMORRHOID SURGERY    .  JOINT REPLACEMENT    . MYOCARDIAL PERFUSION STUDY  12/19/09   NORMAL PATTERN OF PERFUSION IN ALL REGIONS.EF 75%.  . OOPHORECTOMY    . TOTAL KNEE ARTHROPLASTY  1999  . TRANSTHORACIC ECHOCARDIOGRAM  09/11/11   LVEF >55%.STAGE 1 DIASTOLIC DYSFUNCTION,ELEVATED LV FILLING PRESSURE.BIOPROSTHETIC AORTIC VALVE-PEAK AND MEAN GRADIENTS OF 17 MMHG AND 8 MMHG.SIGMOID SEPTUM.MILD TO MOD MR.MILD TO MOD TR.RVSP 46 MMHG.    Allergies  Allergen Reactions  . Hydrocodone Itching and Nausea Only  . Tramadol Hcl Itching and Nausea Only    Current Outpatient Prescriptions  Medication Sig Dispense Refill  . acetaminophen (TYLENOL) 500 MG tablet Take 1,000 mg by mouth every 6 (six) hours as needed for moderate pain (pain).    Marland Kitchen ALPRAZolam (XANAX) 0.25 MG tablet 1/2 or 1 tablet by mouth at night as needed for insomnia 30 tablet 0  . apixaban (ELIQUIS) 5 MG TABS tablet Take 1 tablet (5 mg total) by mouth 2 (two) times daily. 60 tablet 3  . artificial tears (LACRILUBE) OINT ophthalmic ointment Place into both eyes at bedtime as needed for dry eyes. 3.5 g 11  . atorvastatin (LIPITOR) 40 MG tablet Take 1 tablet (40 mg total) by mouth at bedtime. 90 tablet 2  . benazepril (LOTENSIN) 20 MG tablet TAKE  1 TABLET BY MOUTH DAILY 90 tablet 3  . budesonide (PULMICORT) 0.5 MG/2ML nebulizer solution USE 1 VIAL VIA NEBULIZER TWICE A DAY 120 mL 5  . Calcium Carbonate (CALCIUM 500 PO) Take 500 mg by mouth at bedtime.     . cholecalciferol (VITAMIN D) 1000 UNITS tablet Take 1,000 Units by mouth at bedtime.     Marland Kitchen diltiazem (CARDIZEM) 120 MG tablet Take 120 mg by mouth daily.    Marland Kitchen donepezil (ARICEPT) 5 MG tablet Take 1 tablet (5 mg total) by mouth at bedtime. 30 tablet 11  . dorzolamide-timolol (COSOPT) 22.3-6.8 MG/ML ophthalmic solution Place 1 drop into both eyes 2 (two) times daily.     Marland Kitchen esomeprazole (NEXIUM) 40 MG capsule Take 1 capsule (40 mg total) by mouth daily before breakfast. 90 capsule 1  . furosemide (LASIX) 20 MG tablet Take 2 tablets (40 mg total) by mouth daily. 180 tablet 1  . ketoconazole (NIZORAL) 2 % cream Apply 1 application topically daily. 30 g 0  . levalbuterol (XOPENEX) 0.63 MG/3ML nebulizer solution Take 3 mLs (0.63 mg total) by nebulization 2 (two) times daily. 180 mL 5  . magnesium oxide (MAG-OX) 400 (241.3 Mg) MG tablet Take 1 tablet (400 mg total) by mouth at bedtime.    . methimazole (TAPAZOLE) 5 MG tablet Take 1 tablet (5 mg total) by mouth 3 (three) times a week. 40 tablet 3  . metoprolol tartrate (LOPRESSOR) 25 MG tablet Take 1 tablet (25 mg total) by mouth 2 (two) times daily. 180 tablet 0  . nitroGLYCERIN (NITROSTAT) 0.4 MG SL tablet Place 1 tablet (0.4 mg total) under the tongue every 5 (five) minutes x 3 doses as needed for chest pain. 25 tablet 3  . potassium chloride (KLOR-CON M10) 10 MEQ tablet Take 1 tablet (10 mEq total) by mouth at bedtime.    . RESTASIS MULTIDOSE 0.05 % ophthalmic emulsion Place 1 drop into both eyes daily.     . TRAVATAN Z 0.004 % SOLN ophthalmic solution Place 1 drop into both eyes at bedtime.      No current facility-administered medications for this visit.     Socially she is widowed. She lives with her grandson. She  has 2 deceased  children, 4 children alive as well as 4 grandchildren 3 great-grandchildren. There is no tobacco use.  Family History  Problem Relation Age of Onset  . Heart attack Father   . Coronary artery disease Son 30       deceased  . Hypertension Brother   . Stroke Brother   . Colon cancer Neg Hx   . Breast cancer Neg Hx   . Thyroid disease Neg Hx    FH is noteworthy in that her father suffered an MI at age 60. Her mother died at childbirth.  ROS General: Negative; No fevers, chills, or night sweats;  HEENT: Negative; No changes in vision or hearing, sinus congestion, difficulty swallowing Pulmonary: Negative; No cough, wheezing, shortness of breath, hemoptysis Cardiovascular: No chest pain, presyncope, syncope, palpitations GI: Recent GI blood loss requiring 3 unit packed red blood cell transfusion GU: Negative; No dysuria, hematuria, or difficulty voiding Musculoskeletal: Positive for chronic low back discomfort Hematologic/Oncology: Negative; no easy bruising, bleeding Endocrine: Negative; no heat/cold intolerance; no diabetes Neuro: Positive CVA March 2015 resolution of symptoms Skin: Negative; No rashes or skin lesions Psychiatric: Negative; No behavioral problems, depression Sleep: Negative; No snoring, daytime sleepiness, hypersomnolence, bruxism, restless legs, hypnogognic hallucinations, no cataplexy Other comprehensive 14 point system review is negative.  PE BP (!) 170/81   Pulse 66   Ht '5\' 2"'  (1.575 m)   Wt 166 lb 12.8 oz (75.7 kg)   BMI 30.51 kg/m    Repeat blood pressure by me was 130/70/76.  Pulse was in the 60s-70s.  Wt Readings from Last 3 Encounters:  12/17/16 166 lb 12.8 oz (75.7 kg)  12/10/16 168 lb 8 oz (76.4 kg)  10/23/16 168 lb (76.2 kg)    General: Alert, oriented, no distress.  Skin: normal turgor, no rashes, warm and dry HEENT: Normocephalic, atraumatic. Pupils equal round and reactive to light; sclera anicteric; extraocular muscles intact;  Nose  without nasal septal hypertrophy Mouth/Parynx benign; Mallinpatti scale 2 Neck: No JVD, no carotid bruits versus transmitted murmur normal carotid upstroke Lungs: clear to ausculatation and percussion; no wheezing or rales Chest wall: without tenderness to palpitation Heart: Irregularly irregular rhythm compatible with her atrial fibrillation with ventricular rate in the 60s., s1 s2 normal, 2/6 systolic murmur along the sternal border, no diastolic murmur, no rubs, gallops, thrills, or heaves Abdomen: soft, nontender; no hepatosplenomehaly, BS+; abdominal aorta nontender and not dilated by palpation. Back: no CVA tenderness Pulses 2+ Musculoskeletal: full range of motion, normal strength, no joint deformities Extremities: no clubbing cyanosis or edema, Homan's sign negative  Neurologic: grossly nonfocal; Cranial nerves grossly wnl Psychologic: Normal mood and affect   ECG (independently read by me): Atrial fibrillation at 66 bpm with an occasional PVC.  QTc interval 471 ms  09/10/2016 ECG (independently read by me): Atrial fibrillation at 66 bpm.  Nonspecific diffuse T-wave abnormalities.  QTc interval 434 ms.  08/26/2016 ECG (independently read by me): Atrial fibrillation with rate 70-80.  Isolated PVC.  QTc interval 490 ms.  Lateral anterolateral T-wave changes.  July 11, 2016 ECG (independently read by me): Slow atrial fibrillation/junctional rhythm with heart rate 45 bpm.  Nonspecific ST changes.  QTc interval 427 ms.  December 2015 ECG (Independently read by me): Atrial fibrillation at 66 bpm.  Nonspecific ST changes.  April 2015 ECG (Independently read by me): Atrial fibrillation at 76 beats per minute. QTc interval 445  Prior ECG (independently read by me): Atrial flutter with variable block at approximately  65 beats per minute  LABS: BMP Latest Ref Rng & Units 12/10/2016 10/23/2016 08/31/2016  Glucose 70 - 99 mg/dL 120(H) 167(H) 116(H)  BUN 6 - 23 mg/dL '13 13 11  ' Creatinine 0.40  - 1.20 mg/dL 1.01 0.95 0.85  Sodium 135 - 145 mEq/L 140 139 141  Potassium 3.5 - 5.1 mEq/L 4.2 3.3(L) 4.0  Chloride 96 - 112 mEq/L 100 102 105  CO2 19 - 32 mEq/L 34(H) 26 29  Calcium 8.4 - 10.5 mg/dL 9.6 8.9 8.9   Hepatic Function Latest Ref Rng & Units 08/30/2016 03/05/2016 07/26/2015  Total Protein 6.5 - 8.1 g/dL 6.6 6.4(L) 7.2  Albumin 3.5 - 5.0 g/dL 3.2(L) 3.6 4.0  AST 15 - 41 U/L '31 19 21  ' ALT 14 - 54 U/L '26 15 17  ' Alk Phosphatase 38 - 126 U/L 82 83 91  Total Bilirubin 0.3 - 1.2 mg/dL 0.7 0.6 0.4  Bilirubin, Direct 0.0 - 0.3 mg/dL - - -   CBC Latest Ref Rng & Units 10/23/2016 09/20/2016 08/31/2016  WBC 4.0 - 10.5 K/uL 9.1 6.8 5.9  Hemoglobin 12.0 - 15.0 g/dL 15.0 13.7 12.0  Hematocrit 36.0 - 46.0 % 46.1(H) 42.2 38.3  Platelets 150 - 400 K/uL 140(L) 172 196   Lab Results  Component Value Date   MCV 100.4 (H) 10/23/2016   MCV 101.7 (H) 09/20/2016   MCV 100.8 (H) 08/31/2016   Lab Results  Component Value Date   TSH 1.42 08/28/2016   Lab Results  Component Value Date   HGBA1C 6.7 (H) 08/31/2016   Lipid Panel     Component Value Date/Time   CHOL 104 08/31/2016 0700   TRIG 63 08/31/2016 0700   HDL 35 (L) 08/31/2016 0700   CHOLHDL 3.0 08/31/2016 0700   VLDL 13 08/31/2016 0700   LDLCALC 56 08/31/2016 0700    RADIOLOGY: No results found.  IMPRESSION:  1. Chronic atrial fibrillation ([place])   2. Essential hypertension   3. S/P AVR (aortic valve replacement)   4. History of transient ischemic attack (TIA)   5. Anticoagulation adequate   6. Dyslipidemia      ASSESSMENT AND PLAN: [name] is an 83 year old African American female who is 20 years status post aortic valve replacement with a pericardial tissue valve at which time she underwent single-vessel bypass with a saphenous vein graft placed to her RCA in 1998.  A nuclear perfusion study showed a lateral defect with minimal peri-infarction ischemia was not felt to be high risk. She has a history of atrial  fibrillation and in the past was felt to be a fall risk for Coumadin.  In March 2015 she was found to have probable multiple embolic events leading to her neurologic symptoms.  Initially was on eliquis  anticoagulation, but due to GI bleeding from AVMs which were cauterized and treated.  She had been on aspirin therapy. Due to having recurrent neurologic episodes and a TIA associated with transient aphasia and confusion at her last hospitalization anticoagulation was reinstituted.  She recently slid out of bed, but did not have a major fall.  She is aware of risks benefits of anticoagulation regarding fall risk versus potential recurrent neurologic symptoms.  She has not had any chest pain.  Her atrial fibrillation rate is well-controlled.  Upon arrival to the office today her blood pressure was elevated when immediately taken by the nurse but on repeat by me was normal at 130/70.  She will continue taking but has a pill 20  mg, diltiazem 120 mg and furosemide 40 mg daily as well as her metoprolol.  She is on atorvastatin for hyperlipidemia with target LDL less than 70.  Repeat laboratory in March 2018 demonstrated an LDL at 26.  She is on Nexium for her GI issues.  Her aortic bioprosthesis is functioning well on her most recent echo.  Her hemoglobin has remained stable on anticoagulation.  I will see her in 6 months for cardiology evaluation or sooner if problems arise.   Time spent: 25 minutes  Troy Sine, MD, [facility] 12/23/2016 9:33 PM

## 2016-12-17 NOTE — Patient Instructions (Signed)
Your physician wants you to follow-up in: 6 months or sooner if needed. You will receive a reminder letter in the mail two months in advance. If you don't receive a letter, please call our office to schedule the follow-up appointment.   If you need a refill on your cardiac medications before your next appointment, please call your pharmacy. 

## 2017-01-06 ENCOUNTER — Ambulatory Visit: Payer: Medicare Other | Admitting: Internal Medicine

## 2017-01-07 ENCOUNTER — Ambulatory Visit (INDEPENDENT_AMBULATORY_CARE_PROVIDER_SITE_OTHER): Payer: Medicare Other | Admitting: Internal Medicine

## 2017-01-07 ENCOUNTER — Encounter: Payer: Self-pay | Admitting: Internal Medicine

## 2017-01-07 VITALS — BP 130/68 | HR 66 | Temp 98.0°F | Resp 16 | Ht 62.0 in | Wt 167.5 lb

## 2017-01-07 DIAGNOSIS — R197 Diarrhea, unspecified: Secondary | ICD-10-CM

## 2017-01-07 MED ORDER — KETOCONAZOLE 2 % EX CREA: 1.0000 "application " | TOPICAL_CREAM | Freq: Every day | CUTANEOUS | 3 refills | Status: DC

## 2017-01-07 NOTE — Progress Notes (Signed)
Pre visit review using our clinic review tool, if applicable. No additional management support is needed unless otherwise documented below in the visit note. 

## 2017-01-07 NOTE — Patient Instructions (Signed)
  Continue with Kaopectate as needed Drink plenty of clear fluids Follow-up bland diet Call if diarrhea resurface or if you have fever, chills, blood in the stools.   Bland Diet A bland diet consists of foods that do not have a lot of fat or fiber. Foods without fat or fiber are easier for the body to digest. They are also less likely to irritate your mouth, throat, stomach, and other parts of your gastrointestinal tract. A bland diet is sometimes called a BRAT diet. What is my plan? Your health care provider or dietitian may recommend specific changes to your diet to prevent and treat your symptoms, such as:  Eating small meals often.  Cooking food until it is soft enough to chew easily.  Chewing your food well.  Drinking fluids slowly.  Not eating foods that are very spicy, sour, or fatty.  Not eating citrus fruits, such as oranges and grapefruit.  What do I need to know about this diet?  Eat a variety of foods from the bland diet food list.  Do not follow a bland diet longer than you have to.  Ask your health care provider whether you should take vitamins. What foods can I eat? Grains  Hot cereals, such as cream of wheat. Bread, crackers, or tortillas made from refined white flour. Rice. Vegetables Canned or cooked vegetables. Mashed or boiled potatoes. Fruits Bananas. Applesauce. Other types of cooked or canned fruit with the skin and seeds removed, such as canned peaches or pears. Meats and Other Protein Sources Scrambled eggs. Creamy peanut butter or other nut butters. Lean, well-cooked meats, such as chicken or fish. Tofu. Soups or broths. Dairy Low-fat dairy products, such as milk, cottage cheese, or yogurt. Beverages Water. Herbal tea. Apple juice. Sweets and Desserts Pudding. Custard. Fruit gelatin. Ice cream. Fats and Oils Mild salad dressings. Canola or olive oil. The items listed above may not be a complete list of allowed foods or beverages. Contact your  dietitian for more options. What foods are not recommended? Foods and ingredients that are often not recommended include:  Spicy foods, such as hot sauce or salsa.  Fried foods.  Sour foods, such as pickled or fermented foods.  Raw vegetables or fruits, especially citrus or berries.  Caffeinated drinks.  Alcohol.  Strongly flavored seasonings or condiments.  The items listed above may not be a complete list of foods and beverages that are not allowed. Contact your dietitian for more information. This information is not intended to replace advice given to you by your health care provider. Make sure you discuss any questions you have with your health care provider. Document Released: 09/18/2015 Document Revised: 11/02/2015 Document Reviewed: 06/08/2014 Elsevier Interactive Patient Education  2018 [employer].

## 2017-01-07 NOTE — Progress Notes (Signed)
Subjective:    Patient ID: Rebecca Skinner, female    DOB: 1922-10-23, 83 y.o.   MRN: [id number]  DOS:  01/07/2017 Type of visit - description : Acute, here with her daughter [name] Interval history: -Symptoms started 3 days ago with diarrhea described as watery, 2-3 episodes a day. The daughter gave her Kaopectate, symptoms has decreased. Reports no recent antibiotics. Did see  drops of blood in the toilet paper when she wiped. Stools were not bloody. She had some itching and pain at the anal area.  -see last OV: Rash at the abdominal folds: Improved.  Review of Systems  No fever chills. Appetite continued to be very good No nausea or vomiting. No blood in the stools. No abdominal pain but she has some "grawling"  Past Medical History:  Diagnosis Date  . Anxiety 11/20/2011  . Asthma    [facility] DR Samanda Buske  . CAD (coronary artery disease)    s/p CABG s/p AO valve replacement ([place] [facility])  . CVA (cerebral infarction) 08-2013   multiple, L, d/t Afib, started eloquis  . Diabetes mellitus    type 2   . Dizziness    Chronic, admiet 07-2010,saw neuro, thought to be a peripheral issue   . Dry skin   . GERD (gastroesophageal reflux disease)   . Hyperlipidemia   . Hypertension   . Hyperthyroidism   . Keloid    @ chest  . Memory loss   . Osteoarthritis   . Osteopenia    per dexa 12/09  . Paroxysmal atrial fibrillation ([place])    cards d/c coumadin 12/2008 d/t persisten NSR and frequent falls- restarted coumadin july 2011, now on Eliquis  . Recurrent UTI   . Shortness of breath dyspnea   . TIA (transient ischemic attack)     Past Surgical History:  Procedure Laterality Date  . ABDOMINAL HYSTERECTOMY    . AORTIC VALVE REPLACEMENT  1998  . CARDIAC VALVE REPLACEMENT    . CAROTID DOPPLER  07/13/12   BILATERAL BULB/PROXIMAL ICAS;MILD AMOUNT OF FIBROUS PLAQUE WITH NO DIAMETER REDUCTION.  . CESAREAN SECTION     x 2  . COLONOSCOPY WITH PROPOFOL N/A 03/30/2014   Procedure:  COLONOSCOPY WITH PROPOFOL;  Surgeon: Irene Shipper, MD;  Location: [facility];  Service: Endoscopy;  Laterality: N/A;  . CORONARY ARTERY BYPASS GRAFT  1998   SVG TO RCA  . HEMORRHOID SURGERY    . JOINT REPLACEMENT    . MYOCARDIAL PERFUSION STUDY  12/19/09   NORMAL PATTERN OF PERFUSION IN ALL REGIONS.EF 75%.  . OOPHORECTOMY    . TOTAL KNEE ARTHROPLASTY  1999  . TRANSTHORACIC ECHOCARDIOGRAM  09/11/11   LVEF >55%.STAGE 1 DIASTOLIC DYSFUNCTION,ELEVATED LV FILLING PRESSURE.BIOPROSTHETIC AORTIC VALVE-PEAK AND MEAN GRADIENTS OF 17 MMHG AND 8 MMHG.SIGMOID SEPTUM.MILD TO MOD MR.MILD TO MOD TR.RVSP 46 MMHG.    Social History   Social History  . Marital status: Divorced    Spouse name: N/A  . Number of children: 4  . Years of education: N/A   Occupational History  . retired  Retired   Social History Main Topics  . Smoking status: Former Research scientist (life sciences)  . Smokeless tobacco: Never Used     Comment: quit 2004, smoked 1.5 ppd  . Alcohol use No  . Drug use: No  . Sexual activity: Not on file   Other Topics Concern  . Not on file   Social History Narrative   Had to moved w/ her daughter [name])  ~  01-2016   Had 3 daughter- 2 son  (lost oldest daughter and son), 2 living daughters in [place] as of 01/07/2017      Reactions   Hydrocodone Itching, Nausea Only   Tramadol Hcl Itching, Nausea Only      Medication List       Accurate as of 01/07/17 11:59 PM. Always use your most recent med list.          acetaminophen 500 MG tablet Commonly known as:  TYLENOL Take 1,000 mg by mouth every 6 (six) hours as needed for moderate pain (pain).   ALPRAZolam 0.25 MG tablet Commonly known as:  XANAX 1/2 or 1 tablet by mouth at night as needed for insomnia   apixaban 5 MG Tabs tablet Commonly known as:  ELIQUIS Take 1 tablet (5 mg total) by mouth 2 (two) times daily.   artificial tears Oint ophthalmic ointment Place into both eyes at bedtime as needed for dry eyes.     atorvastatin 40 MG tablet Commonly known as:  LIPITOR Take 1 tablet (40 mg total) by mouth at bedtime.   benazepril 20 MG tablet Commonly known as:  LOTENSIN TAKE 1 TABLET BY MOUTH DAILY   budesonide 0.5 MG/2ML nebulizer solution Commonly known as:  PULMICORT USE 1 VIAL VIA NEBULIZER TWICE A DAY   CALCIUM 500 PO Take 500 mg by mouth at bedtime.   cholecalciferol 1000 units tablet Commonly known as:  VITAMIN D Take 1,000 Units by mouth at bedtime.   diltiazem 120 MG tablet Commonly known as:  CARDIZEM Take 120 mg by mouth daily.   donepezil 5 MG tablet Commonly known as:  ARICEPT Take 1 tablet (5 mg total) by mouth at bedtime.   dorzolamide-timolol 22.3-6.8 MG/ML ophthalmic solution Commonly known as:  COSOPT Place 1 drop into both eyes 2 (two) times daily.   esomeprazole 40 MG capsule Commonly known as:  NEXIUM Take 1 capsule (40 mg total) by mouth daily before breakfast.   furosemide 20 MG tablet Commonly known as:  LASIX Take 2 tablets (40 mg total) by mouth daily.   ketoconazole 2 % cream Commonly known as:  NIZORAL Apply 1 application topically daily.   levalbuterol 0.63 MG/3ML nebulizer solution Commonly known as:  XOPENEX Take 3 mLs (0.63 mg total) by nebulization 2 (two) times daily.   magnesium oxide 400 (241.3 Mg) MG tablet Commonly known as:  MAG-OX Take 1 tablet (400 mg total) by mouth at bedtime.   methimazole 5 MG tablet Commonly known as:  TAPAZOLE Take 1 tablet (5 mg total) by mouth 3 (three) times a week.   metoprolol tartrate 25 MG tablet Commonly known as:  LOPRESSOR Take 1 tablet (25 mg total) by mouth 2 (two) times daily.   nitroGLYCERIN 0.4 MG SL tablet Commonly known as:  NITROSTAT Place 1 tablet (0.4 mg total) under the tongue every 5 (five) minutes x 3 doses as needed for chest pain.   potassium chloride 10 MEQ tablet Commonly known as:  KLOR-CON M10 Take 1 tablet (10 mEq total) by mouth at bedtime.   RESTASIS MULTIDOSE 0.05  % ophthalmic emulsion Generic drug:  cycloSPORINE Place 1 drop into both eyes daily.   TRAVATAN Z 0.004 % Soln ophthalmic solution Generic drug:  Travoprost (BAK Free) Place 1 drop into both eyes at bedtime.          Objective:   Physical Exam BP 130/68 (BP Location: Left Arm, Patient Position:  Sitting, Cuff Size: Small)   Pulse 66   Temp 98 F (36.7 C) (Oral)   Resp 16   Ht 5\' 2"  (1.575 m)   Wt 167 lb 8 oz (76 kg)   SpO2 96%   BMI 30.64 kg/m  General:   Well developed, well nourished . NAD.  HEENT:  Normocephalic . Face symmetric, atraumatic Abdomen:  Not distended, soft, non-tender. No rebound or rigidity.  Skin:  L abdominal folds cover with a cream, no actual redness or swelling. Some hyperpigmentation. Neurologic:  alert  . Speech normal  Psych--  Behavior appropriate. No anxious or depressed appearing.    Assessment & Plan:   Assessment DM HTN Hyperlipidemia Hyperthyroidism -- used to see Dr. Loanne Drilling, last visit 01-2016, now f/u PCP, check labs x 2 q year GERD Asthma  Anxiety on xanax  CV: Dr. Claiborne Billings --CHF, CAD, CABG, Aortic valve replacement --P- atrial fibrillation, used to be on Coumadin, Eliquis rx after the stroke 3-15, d/c 03-2014 d/t GI bleed , on ASA 81. Dx TIA 08-2016, back on eliquis, asa d/c NEURO --TIAs (admited 08-2016), CVA 2015 --Dementia MMSE ~ 12 to 15  (12-2016) DJD Osteopenia 2009 Recurrent UTIs Daughter  Medha Pippen; lives w/ [name]  PLAN Acute diarrhea: Sxs started 3 days ago, getting better, no red flag sxs. Recommend to continue Kaopectate also bland diet.. See instructions. Dermatitis, rash: at abdominal folds, seems to be improving. Continue Nizoral as needed, keep the area dry with powder.

## 2017-01-08 NOTE — Assessment & Plan Note (Signed)
Acute diarrhea: Sxs started 3 days ago, getting better, no red flag sxs. Recommend to continue Kaopectate also bland diet.. See instructions. Dermatitis, rash: at abdominal folds, seems to be improving. Continue Nizoral as needed, keep the area dry with powder.

## 2017-01-21 ENCOUNTER — Other Ambulatory Visit: Payer: Self-pay | Admitting: Internal Medicine

## 2017-01-21 NOTE — Telephone Encounter (Signed)
Rx sent 

## 2017-01-21 NOTE — Telephone Encounter (Addendum)
Received refill request for Eliquis, Pt is 83 y/o. Okay to continue refilling Eliquis?

## 2017-01-21 NOTE — Telephone Encounter (Signed)
Okay to refill 4 months

## 2017-01-27 ENCOUNTER — Telehealth: Payer: Self-pay | Admitting: Internal Medicine

## 2017-01-27 NOTE — Telephone Encounter (Addendum)
Caller name:  Mistry,Ruth Relation to pt: daughter  Call back number: [phone]  Pharmacy: [id number] - [place], [place]. [phone] (Phone) [phone] (Fax)    Reason for call:  Daughter would like to discuss patient hot flashes, requesting Rx or clinical advice

## 2017-01-27 NOTE — Telephone Encounter (Signed)
Please advise 

## 2017-01-28 NOTE — Telephone Encounter (Signed)
Spoke w/ Pt's daughter, [name]- Pt not having chills necessarily but she again describes them as hot flashes- Pt having to fan herself during episodes. Recommended that Pt be brought in for visit as recommended by PCP. Appt scheduled 01/30/2017 at 0930.

## 2017-01-28 NOTE — Telephone Encounter (Signed)
Hot flashes at her age is very unlikely. I really need to sit down within an understand what they mean by hot flashes. Schedule office visit if so desired. Definitely let me know if she is having chills, fever, rash.

## 2017-01-30 ENCOUNTER — Encounter: Payer: Self-pay | Admitting: Internal Medicine

## 2017-01-30 ENCOUNTER — Ambulatory Visit (HOSPITAL_BASED_OUTPATIENT_CLINIC_OR_DEPARTMENT_OTHER)
Admission: RE | Admit: 2017-01-30 | Discharge: 2017-01-30 | Disposition: A | Discharge status: Home / Self Care | Payer: Medicare Other | Source: Ambulatory Visit | Attending: Internal Medicine | Admitting: Internal Medicine

## 2017-01-30 ENCOUNTER — Other Ambulatory Visit: Payer: Self-pay | Admitting: Internal Medicine

## 2017-01-30 ENCOUNTER — Ambulatory Visit (INDEPENDENT_AMBULATORY_CARE_PROVIDER_SITE_OTHER): Payer: Medicare Other | Admitting: Internal Medicine

## 2017-01-30 VITALS — BP 126/80 | HR 56 | Temp 98.3°F | Resp 14 | Ht 62.0 in | Wt 165.4 lb

## 2017-01-30 DIAGNOSIS — R197 Diarrhea, unspecified: Secondary | ICD-10-CM

## 2017-01-30 DIAGNOSIS — R6883 Chills (without fever): Secondary | ICD-10-CM | POA: Diagnosis not present

## 2017-01-30 DIAGNOSIS — R232 Flushing: Secondary | ICD-10-CM | POA: Diagnosis not present

## 2017-01-30 DIAGNOSIS — I517 Cardiomegaly: Secondary | ICD-10-CM | POA: Diagnosis not present

## 2017-01-30 DIAGNOSIS — E0590 Thyrotoxicosis, unspecified without thyrotoxic crisis or storm: Secondary | ICD-10-CM

## 2017-01-30 LAB — URINALYSIS
Bilirubin Urine: NEGATIVE
Hgb urine dipstick: NEGATIVE
KETONES UR: NEGATIVE
Leukocytes, UA: NEGATIVE
Nitrite: NEGATIVE
SPECIFIC GRAVITY, URINE: 1.01 (ref 1.000–1.030)
Total Protein, Urine: NEGATIVE
URINE GLUCOSE: NEGATIVE
UROBILINOGEN UA: 0.2 (ref 0.0–1.0)
pH: 7 (ref 5.0–8.0)

## 2017-01-30 LAB — CBC WITH DIFFERENTIAL/PLATELET
BASOS PCT: 1.1 % (ref 0.0–3.0)
Basophils Absolute: 0.1 10*3/uL (ref 0.0–0.1)
EOS PCT: 2.2 % (ref 0.0–5.0)
Eosinophils Absolute: 0.1 10*3/uL (ref 0.0–0.7)
HEMATOCRIT: 42.1 % (ref 36.0–46.0)
HEMOGLOBIN: 13.4 g/dL (ref 12.0–15.0)
LYMPHS PCT: 23.9 % (ref 12.0–46.0)
Lymphs Abs: 1.3 10*3/uL (ref 0.7–4.0)
MCHC: 31.8 g/dL (ref 30.0–36.0)
MCV: 101.9 fl — ABNORMAL HIGH (ref 78.0–100.0)
MONOS PCT: 9.8 % (ref 3.0–12.0)
Monocytes Absolute: 0.5 10*3/uL (ref 0.1–1.0)
Neutro Abs: 3.3 10*3/uL (ref 1.4–7.7)
Neutrophils Relative %: 63 % (ref 43.0–77.0)
Platelets: 178 10*3/uL (ref 150.0–400.0)
RBC: 4.13 Mil/uL (ref 3.87–5.11)
RDW: 14.5 % (ref 11.5–15.5)
WBC: 5.2 10*3/uL (ref 4.0–10.5)

## 2017-01-30 LAB — T4, FREE: Free T4: 0.84 ng/dL (ref 0.60–1.60)

## 2017-01-30 LAB — T3, FREE: T3, Free: 3.3 pg/mL (ref 2.3–4.2)

## 2017-01-30 LAB — TSH: TSH: 0.66 u[IU]/mL (ref 0.35–4.50)

## 2017-01-30 NOTE — Assessment & Plan Note (Signed)
"  Hot flashes" : sx as described above, not clear if they represent fever/chills, "B symptoms", thyroid or meds related sx, infection or others. Will start by doing a chest x-ray, UA, urine culture, CBC, peripheral blood smears. Hypothyroidism: Check TFTs, on Tapazole Diarrhea: As described above, will check a stool culture, WBC and C. difficile. Sxs started shortly after Aricept was rx thus will d/c Aricept for now. RTC 3 weeks

## 2017-01-30 NOTE — Patient Instructions (Signed)
GO TO THE LAB : Get the blood work  . Also please get containers and bring back a stool sample  GO TO THE FRONT DESK Schedule your next appointment for a  3 weeks for a checkup  Get a chest x-ray downstairs  Stop Aricept

## 2017-01-30 NOTE — Progress Notes (Signed)
Subjective:    Patient ID: Rebecca Skinner, female    DOB: Nov 06, 1922, 83 y.o.   MRN: [id number]  DOS:  01/30/2017 Type of visit - description : acute, Here with her daughter. Complains of hot flashes and diarrhea Interval history:  Hot flashes: Described as feeling hot, cold. Symptoms started about 3 weeks ago, happen 2 or 3 times a day in the last 5 minutes. She reports sweats and is not sure if she could describe symptoms as chills. Symptoms can be day or night, not associated headaches.  Also, 4 weeks history of diarrhea, several BMs a day, stools are watery, loose. Associated with cramps but no abdominal pain per se.   Review of Systems  No fevers No dysuria or gross hematuria No nausea vomiting. No blood in the stools. Has not noticed any lumps at the neck. No rash. No cough or sputum production  Past Medical History:  Diagnosis Date  . Anxiety 11/20/2011  . Asthma    [facility] DR Iniya Matzek  . CAD (coronary artery disease)    s/p CABG s/p AO valve replacement ([place] [facility])  . CVA (cerebral infarction) 08-2013   multiple, L, d/t Afib, started eloquis  . Diabetes mellitus    type 2   . Dizziness    Chronic, admiet 07-2010,saw neuro, thought to be a peripheral issue   . Dry skin   . GERD (gastroesophageal reflux disease)   . Hyperlipidemia   . Hypertension   . Hyperthyroidism   . Keloid    @ chest  . Memory loss   . Osteoarthritis   . Osteopenia    per dexa 12/09  . Paroxysmal atrial fibrillation ([place])    cards d/c coumadin 12/2008 d/t persisten NSR and frequent falls- restarted coumadin july 2011, now on Eliquis  . Recurrent UTI   . Shortness of breath dyspnea   . TIA (transient ischemic attack)     Past Surgical History:  Procedure Laterality Date  . ABDOMINAL HYSTERECTOMY    . AORTIC VALVE REPLACEMENT  1998  . CARDIAC VALVE REPLACEMENT    . CAROTID DOPPLER  07/13/12   BILATERAL BULB/PROXIMAL ICAS;MILD AMOUNT OF FIBROUS PLAQUE WITH NO DIAMETER REDUCTION.    . CESAREAN SECTION     x 2  . COLONOSCOPY WITH PROPOFOL N/A 03/30/2014   Procedure: COLONOSCOPY WITH PROPOFOL;  Surgeon: Irene Shipper, MD;  Location: [facility];  Service: Endoscopy;  Laterality: N/A;  . CORONARY ARTERY BYPASS GRAFT  1998   SVG TO RCA  . HEMORRHOID SURGERY    . JOINT REPLACEMENT    . MYOCARDIAL PERFUSION STUDY  12/19/09   NORMAL PATTERN OF PERFUSION IN ALL REGIONS.EF 75%.  . OOPHORECTOMY    . TOTAL KNEE ARTHROPLASTY  1999  . TRANSTHORACIC ECHOCARDIOGRAM  09/11/11   LVEF >55%.STAGE 1 DIASTOLIC DYSFUNCTION,ELEVATED LV FILLING PRESSURE.BIOPROSTHETIC AORTIC VALVE-PEAK AND MEAN GRADIENTS OF 17 MMHG AND 8 MMHG.SIGMOID SEPTUM.MILD TO MOD MR.MILD TO MOD TR.RVSP 46 MMHG.    Social History   Social History  . Marital status: Divorced    Spouse name: N/A  . Number of children: 4  . Years of education: N/A   Occupational History  . retired  Retired   Social History Main Topics  . Smoking status: Former Research scientist (life sciences)  . Smokeless tobacco: Never Used     Comment: quit 2004, smoked 1.5 ppd  . Alcohol use No  . Drug use: No  . Sexual activity: Not on file   Other  Topics Concern  . Not on file   Social History Narrative   Had to moved w/ [name]  ~ 01-2016   Had 3 daughter- 2 son  (lost oldest daughter and son), 2 living daughters in [place] as of 01/30/2017      Reactions   Hydrocodone Itching, Nausea Only   Tramadol Hcl Itching, Nausea Only      Medication List       Accurate as of 01/30/17  6:28 PM. Always use your most recent med list.          acetaminophen 500 MG tablet Commonly known as:  TYLENOL Take 1,000 mg by mouth every 6 (six) hours as needed for moderate pain (pain).   ALPRAZolam 0.25 MG tablet Commonly known as:  XANAX 1/2 or 1 tablet by mouth at night as needed for insomnia   apixaban 5 MG Tabs tablet Commonly known as:  ELIQUIS Take 1 tablet (5 mg total) by mouth 2 (two) times daily.   artificial tears Oint ophthalmic  ointment Place into both eyes at bedtime as needed for dry eyes.   atorvastatin 40 MG tablet Commonly known as:  LIPITOR Take 1 tablet (40 mg total) by mouth at bedtime.   benazepril 20 MG tablet Commonly known as:  LOTENSIN TAKE 1 TABLET BY MOUTH DAILY   budesonide 0.5 MG/2ML nebulizer solution Commonly known as:  PULMICORT USE 1 VIAL VIA NEBULIZER TWICE A DAY   CALCIUM 500 PO Take 500 mg by mouth at bedtime.   cholecalciferol 1000 units tablet Commonly known as:  VITAMIN D Take 1,000 Units by mouth at bedtime.   diltiazem 120 MG tablet Commonly known as:  CARDIZEM Take 120 mg by mouth daily.   dorzolamide-timolol 22.3-6.8 MG/ML ophthalmic solution Commonly known as:  COSOPT Place 1 drop into both eyes 2 (two) times daily.   esomeprazole 40 MG capsule Commonly known as:  NEXIUM Take 1 capsule (40 mg total) by mouth daily before breakfast.   furosemide 20 MG tablet Commonly known as:  LASIX Take 2 tablets (40 mg total) by mouth daily.   ketoconazole 2 % cream Commonly known as:  NIZORAL Apply 1 application topically daily.   levalbuterol 0.63 MG/3ML nebulizer solution Commonly known as:  XOPENEX Take 3 mLs (0.63 mg total) by nebulization 2 (two) times daily.   magnesium oxide 400 (241.3 Mg) MG tablet Commonly known as:  MAG-OX Take 1 tablet (400 mg total) by mouth at bedtime.   Magnesium Oxide 400 (240 Mg) MG Tabs Take 1 tablet (400 mg total) by mouth daily.   methimazole 5 MG tablet Commonly known as:  TAPAZOLE Take 1 tablet (5 mg total) by mouth 3 (three) times a week.   metoprolol tartrate 25 MG tablet Commonly known as:  LOPRESSOR Take 1 tablet (25 mg total) by mouth 2 (two) times daily.   nitroGLYCERIN 0.4 MG SL tablet Commonly known as:  NITROSTAT Place 1 tablet (0.4 mg total) under the tongue every 5 (five) minutes x 3 doses as needed for chest pain.   potassium chloride 10 MEQ tablet Commonly known as:  KLOR-CON M10 Take 1 tablet (10 mEq  total) by mouth daily.   RESTASIS MULTIDOSE 0.05 % ophthalmic emulsion Generic drug:  cycloSPORINE Place 1 drop into both eyes daily.   TRAVATAN Z 0.004 % Soln ophthalmic solution Generic drug:  Travoprost (BAK Free) Place 1 drop into both eyes at bedtime.  Discharge Care Instructions        Start     Ordered   01/30/17 0000  CBC w/Diff     01/30/17 1015   01/30/17 0000  Pathologist smear review     01/30/17 1015   01/30/17 0000  TSH     01/30/17 1015   01/30/17 0000  T3, free     01/30/17 1015   01/30/17 0000  T4, free     01/30/17 1015   01/30/17 0000  Stool C-Diff Toxin Assay     01/30/17 1015   01/30/17 0000  Stool, WBC/Lactoferrin     01/30/17 1015   01/30/17 0000  Stool Culture     01/30/17 1015   01/30/17 0000  DG Chest 2 View    Question Answer Comment  Reason for Exam (SYMPTOM  OR DIAGNOSIS REQUIRED) chills   Preferred imaging location? [facility]      01/30/17 1015   01/30/17 0000  Urinalysis     01/30/17 1229   01/30/17 0000  Urine Culture     01/30/17 1229         Objective:   Physical Exam BP 126/80 (BP Location: Left Arm, Patient Position: Sitting, Cuff Size: Small)   Pulse (!) 56   Temp 98.3 F (36.8 C) (Oral)   Resp 14   Ht 5\' 2"  (1.575 m)   Wt 165 lb 6 oz (75 kg)   SpO2 97%   BMI 30.25 kg/m  General:   Well developed, well nourished . NAD. She seems to be at her baseline HEENT:  Normocephalic . Face symmetric, atraumatic. Neck: Palpable but not tender thyroid gland. No mass or LAD. Lymphatic system: No mass or LAD on the axillary or groin areas. Lungs:  Few wheezes, otherwise clear Normal respiratory effort, no intercostal retractions, no accessory muscle use. Heart: Irregular,  no murmur.  no pretibial edema bilaterally  Abdomen:  Not distended, soft, non-tender. No rebound or rigidity.  Skin: Not pale. Not jaundice Neurologic:  alert & oriented X3.  Speech normal, gait appropriate for age and  unassisted Psych--  Cognition and judgment appear intact.  Cooperative with normal attention span and concentration.  Behavior appropriate. No anxious or depressed appearing.    Assessment & Plan:   Assessment DM HTN Hyperlipidemia Hyperthyroidism -- used to see Dr. Loanne Drilling, last visit 01-2016, now f/u PCP, check labs x 2 q year GERD Asthma  Anxiety on xanax  CV: Dr. Claiborne Billings --CHF, CAD, CABG, Aortic valve replacement --P- atrial fibrillation, used to be on Coumadin, Eliquis rx after the stroke 3-15, d/c 03-2014 d/t GI bleed , on ASA 81. Dx TIA 08-2016, back on eliquis, asa d/c NEURO --TIAs (admited 08-2016), CVA 2015 --Dementia MMSE ~ 12 to 15  (12-2016) DJD Osteopenia 2009 Recurrent UTIs Daughter  Yanin Muhlestein; lives w/ [name]  PLAN "Hot flashes" : sx as described above, not clear if they represent fever/chills, "B symptoms", thyroid or meds related sx, infection or others. Will start by doing a chest x-ray, UA, urine culture, CBC, peripheral blood smears. Hypothyroidism: Check TFTs, on Tapazole Diarrhea: As described above, will check a stool culture, WBC and C. difficile. Sxs started shortly after Aricept was rx thus will d/c Aricept for now. RTC 3 weeks

## 2017-01-30 NOTE — Progress Notes (Signed)
Pre visit review using our clinic review tool, if applicable. No additional management support is needed unless otherwise documented below in the visit note. 

## 2017-01-31 LAB — PATHOLOGIST SMEAR REVIEW

## 2017-02-01 LAB — URINE CULTURE

## 2017-02-03 ENCOUNTER — Encounter: Payer: Self-pay | Admitting: Physician Assistant

## 2017-02-03 ENCOUNTER — Ambulatory Visit (INDEPENDENT_AMBULATORY_CARE_PROVIDER_SITE_OTHER): Payer: Medicare Other | Admitting: Physician Assistant

## 2017-02-03 VITALS — BP 151/78 | HR 65 | Temp 97.8°F | Resp 18 | Ht 61.42 in | Wt 166.5 lb

## 2017-02-03 DIAGNOSIS — H25813 Combined forms of age-related cataract, bilateral: Secondary | ICD-10-CM | POA: Diagnosis not present

## 2017-02-03 DIAGNOSIS — H40013 Open angle with borderline findings, low risk, bilateral: Secondary | ICD-10-CM | POA: Diagnosis not present

## 2017-02-03 DIAGNOSIS — R030 Elevated blood-pressure reading, without diagnosis of hypertension: Secondary | ICD-10-CM

## 2017-02-03 DIAGNOSIS — M25511 Pain in right shoulder: Secondary | ICD-10-CM

## 2017-02-03 DIAGNOSIS — H18423 Band keratopathy, bilateral: Secondary | ICD-10-CM | POA: Diagnosis not present

## 2017-02-03 DIAGNOSIS — G8929 Other chronic pain: Secondary | ICD-10-CM

## 2017-02-03 LAB — HM DIABETES EYE EXAM

## 2017-02-03 MED ORDER — LIDOCAINE 5 % EX PTCH: 1.0000 | MEDICATED_PATCH | CUTANEOUS | 0 refills | Status: DC

## 2017-02-03 MED ORDER — CEFDINIR 300 MG PO CAPS: 300.0000 mg | ORAL_CAPSULE | Freq: Two times a day (BID) | ORAL | 0 refills | Status: DC

## 2017-02-03 MED ORDER — DICLOFENAC SODIUM 1 % TD GEL: 2.0000 g | Freq: Four times a day (QID) | TRANSDERMAL | 0 refills | Status: DC

## 2017-02-03 NOTE — Addendum Note (Signed)
Addended byDamita Dunnings D on: 02/03/2017 02:20 PM   Modules accepted: Orders

## 2017-02-03 NOTE — Progress Notes (Signed)
Rebecca Skinner  MRN: [id number] DOB: 04-25-1923  Subjective:  Rebecca Skinner is a 83 y.o. female who presents with right shoulder pain. The symptoms began 2 weeks ago. Denies acute injury, swelling, redness, numbness, and tingling.  Aggravating factors: no known event. Pain is located diffusely throughout the shoulder. Discomfort is described as sharp/stabbing. Symptoms are exacerbated by repetitive movements and lying on the shoulder. Evaluation to date: plain films (plain films on 05/29/2016 showed degenerative changes and subacromial spurring) and Ortho consult: thinks it was a rotator cuff issue but notes they would not do surgery due to her age. Therapy to date includes: corticosteroid injection which was effective and . For this particular episode she has just used muscle rub and tylenol which has not fully led to relief. Per pt, she cannot take NSAIDs. Has allergies to other pain meds.. She goes to [facility] for shoulder issues but could not get in until 02/16/17.   Review of Systems  Constitutional: Negative for chills, diaphoresis and fever.  Eyes: Negative for visual disturbance.  Respiratory: Negative for shortness of breath.   Cardiovascular: Negative for chest pain and palpitations.  Gastrointestinal: Negative for abdominal pain, nausea and vomiting.  Neurological: Negative for dizziness, light-headedness and headaches.    Patient Active Problem List   Diagnosis Date Noted  . TIA (transient ischemic attack) 08/30/2016  . Acute kidney injury ([place]) 03/05/2016  . Exposure keratitis 09/23/2015  . Keratopathy, band 09/13/2015  . PCP NOTES >>>>> 04/05/2015  . Chronic diastolic heart failure ([place]) 12/29/2014  . Edema 12/20/2014  . Dyspnea 12/20/2014  . Angiectasia 03/30/2014  . GI bleed 03/28/2014  . Dementia 09/16/2013  . Annual physical exam 11/21/2011  . ATRIAL FIBRILLATION, chronic 03/05/2007  . Diabetes mellitus with circulatory complication ([place]) 17/61/6073  . S/P  AVR (aortic valve replacement) 10/15/2006  . Hyperthyroidism 09/03/2006  . Dyslipidemia 09/03/2006  . HTN (hypertension) 09/03/2006  . CAD (coronary artery disease) of artery bypass graft 09/03/2006  . Asthma, chronic 09/03/2006    Current Outpatient Prescriptions on File Prior to Visit  Medication Sig Dispense Refill  . acetaminophen (TYLENOL) 500 MG tablet Take 1,000 mg by mouth every 6 (six) hours as needed for moderate pain (pain).    Marland Kitchen ALPRAZolam (XANAX) 0.25 MG tablet 1/2 or 1 tablet by mouth at night as needed for insomnia 30 tablet 0  . apixaban (ELIQUIS) 5 MG TABS tablet Take 1 tablet (5 mg total) by mouth 2 (two) times daily. 60 tablet 3  . artificial tears (LACRILUBE) OINT ophthalmic ointment Place into both eyes at bedtime as needed for dry eyes. 3.5 g 11  . atorvastatin (LIPITOR) 40 MG tablet Take 1 tablet (40 mg total) by mouth at bedtime. 90 tablet 2  . benazepril (LOTENSIN) 20 MG tablet TAKE 1 TABLET BY MOUTH DAILY 90 tablet 3  . budesonide (PULMICORT) 0.5 MG/2ML nebulizer solution USE 1 VIAL VIA NEBULIZER TWICE A DAY 120 mL 5  . Calcium Carbonate (CALCIUM 500 PO) Take 500 mg by mouth at bedtime.     . cefdinir (OMNICEF) 300 MG capsule Take 1 capsule (300 mg total) by mouth 2 (two) times daily. 10 capsule 0  . cholecalciferol (VITAMIN D) 1000 UNITS tablet Take 1,000 Units by mouth at bedtime.     Marland Kitchen diltiazem (CARDIZEM) 120 MG tablet Take 120 mg by mouth daily.    . dorzolamide-timolol (COSOPT) 22.3-6.8 MG/ML ophthalmic solution Place 1 drop into both eyes 2 (two) times daily.     Marland Kitchen  esomeprazole (NEXIUM) 40 MG capsule Take 1 capsule (40 mg total) by mouth daily before breakfast. 90 capsule 1  . furosemide (LASIX) 20 MG tablet Take 2 tablets (40 mg total) by mouth daily. 180 tablet 1  . ketoconazole (NIZORAL) 2 % cream Apply 1 application topically daily. 30 g 3  . levalbuterol (XOPENEX) 0.63 MG/3ML nebulizer solution Take 3 mLs (0.63 mg total) by nebulization 2 (two) times  daily. 180 mL 5  . magnesium oxide (MAG-OX) 400 (241.3 Mg) MG tablet Take 1 tablet (400 mg total) by mouth at bedtime.    . Magnesium Oxide 400 (240 Mg) MG TABS Take 1 tablet (400 mg total) by mouth daily. 90 tablet 1  . methimazole (TAPAZOLE) 5 MG tablet Take 1 tablet (5 mg total) by mouth 3 (three) times a week. 40 tablet 3  . metoprolol tartrate (LOPRESSOR) 25 MG tablet Take 1 tablet (25 mg total) by mouth 2 (two) times daily. 180 tablet 0  . nitroGLYCERIN (NITROSTAT) 0.4 MG SL tablet Place 1 tablet (0.4 mg total) under the tongue every 5 (five) minutes x 3 doses as needed for chest pain. 25 tablet 3  . potassium chloride (KLOR-CON M10) 10 MEQ tablet Take 1 tablet (10 mEq total) by mouth daily. 90 tablet 1  . RESTASIS MULTIDOSE 0.05 % ophthalmic emulsion Place 1 drop into both eyes daily.     . TRAVATAN Z 0.004 % SOLN ophthalmic solution Place 1 drop into both eyes at bedtime.      No current facility-administered medications on file prior to visit.     Allergies  Allergen Reactions  . Hydrocodone Itching and Nausea Only  . Tramadol Hcl Itching and Nausea Only     Objective:  BP (!) 181/77 (BP Location: Right Arm, Patient Position: Sitting, Cuff Size: Normal)   Pulse 65   Temp 97.8 F (36.6 C) (Oral)   Resp 18   Ht 5' 1.42" (1.56 m)   Wt 166 lb 8 oz (75.5 kg)   SpO2 99%   BMI 31.03 kg/m   Physical Exam  Constitutional: She is oriented to person, place, and time and well-developed, well-nourished, and in no distress.  HENT:  Head: Normocephalic and atraumatic.  Eyes: Conjunctivae are normal.  Neck: Normal range of motion.  Cardiovascular: Normal rate, regular rhythm and normal heart sounds.   Pulses:      Radial pulses are 2+ on the right side, and 2+ on the left side.  Pulmonary/Chest: Effort normal.  Musculoskeletal:       Right shoulder: She exhibits tenderness (with palpation of anterior, posterior and lateral aspect of shoulder joint) and crepitus ( moderate). She  exhibits normal range of motion, no swelling, no effusion, no spasm and normal strength.  Neurological: She is alert and oriented to person, place, and time.  5/5 strength of upper extremities bilaterally.  Patient in wheelchair.  Skin: Skin is warm and dry.  Psychiatric: Affect normal.  Vitals reviewed.   Assessment and Plan :  1. Chronic right shoulder pain No acute findings on exam. Pt has full strength and ROM in right shoulder. Due to history and drug allergies,will provide patient with topical relief until she can be seen by [facility] orthoped[facility]. - lidocaine (LIDODERM) 5 %; Place 1 patch onto the skin daily. Remove & Discard patch within 12 hours or as directed by MD  Dispense: 15 patch; Refill: 0 - diclofenac sodium (VOLTAREN) 1 % GEL; Apply 2 g topically 4 (four) times daily.  Dispense: 100  g; Refill: 0 2. Elevated blood pressure reading Asymptomatic. Instructed to check bp outside of office over the next couple of weeks. Return if consistently >140/90. Given strict ED precautions.    Tenna Delaine PA-C  [facility] 02/03/2017 6:01 PM

## 2017-02-03 NOTE — Patient Instructions (Addendum)
For shoulder pain, I would like you to experiment with topical voltaren gel and lidoderm patches. Do not use both in the same day but use whichever one provides you with the best pain relief. I also recommend applying ice to the affected area up to 4-5 x a day for 20 minutes at a time. Please take tylenol daily as prescribed. Follow up with [facility] as planned. Thank you for letting me participate in your health and well being.  Shoulder Pain Many things can cause shoulder pain, including:  An injury.  Moving the arm in the same way again and again (overuse).  Joint pain (arthritis).  Follow these instructions at home: Take these actions to help with your pain:  Squeeze a soft ball or a foam pad as much as you can. This helps to prevent swelling. It also makes the arm stronger.  Take over-the-counter and prescription medicines only as told by your doctor.  If told, put ice on the area: ? Put ice in a plastic bag. ? Place a towel between your skin and the bag. ? Leave the ice on for 20 minutes, 2-3 times per day. Stop putting on ice if it does not help with the pain.  If you were given a shoulder sling or immobilizer: ? Wear it as told. ? Remove it to shower or bathe. ? Move your arm as little as possible. ? Keep your hand moving. This helps prevent swelling.  Contact a doctor if:  Your pain gets worse.  Medicine does not help your pain.  You have new pain in your arm, hand, or fingers. Get help right away if:  Your arm, hand, or fingers: ? Tingle. ? Are numb. ? Are swollen. ? Are painful. ? Turn white or blue. This information is not intended to replace advice given to you by your health care provider. Make sure you discuss any questions you have with your health care provider. Document Released: 11/13/2007 Document Revised: 01/21/2016 Document Reviewed: 09/19/2014 Elsevier Interactive Patient Education  2018 [employer].    IF you received an x-ray  today, you will receive an invoice from [facility]. Please contact [facility] at [phone] with questions or concerns regarding your invoice.   IF you received labwork today, you will receive an invoice from [facility] [facility]. Please contact [facility] at [phone] with questions or concerns regarding your invoice.   Our billing staff will not be able to assist you with questions regarding bills from these companies.  You will be contacted with the lab results as soon as they are available. The fastest way to get your results is to activate your My Chart account. Instructions are located on the last page of this paperwork. If you have not heard from [place] regarding the results in 2 weeks, please contact this office.

## 2017-02-05 ENCOUNTER — Telehealth: Payer: Self-pay

## 2017-02-05 ENCOUNTER — Other Ambulatory Visit: Payer: Medicare Other

## 2017-02-05 DIAGNOSIS — R197 Diarrhea, unspecified: Secondary | ICD-10-CM | POA: Diagnosis not present

## 2017-02-05 NOTE — Telephone Encounter (Signed)
Filled out request for Voltaren gel (generic) 1% on cover my meds.  Key is [id number]. 72 hours for results. Also, filled out request for Lidocaine 5% patches.  Also a 72 hour processing period.  Key code is [id number].

## 2017-02-05 NOTE — Telephone Encounter (Signed)
Received denial on both the Voltaren (generic) gel 1% and the 5% lidocaine patches.  Made Rebecca Skinner aware.  She said she would call and talk to them.

## 2017-02-06 ENCOUNTER — Telehealth: Payer: Self-pay | Admitting: *Deleted

## 2017-02-06 LAB — FECAL LACTOFERRIN, QUANT: LACTOFERRIN: NEGATIVE

## 2017-02-06 NOTE — Telephone Encounter (Signed)
Received Medical/Surgical Clearance Form from [facility]; forwarded to provider/SLS 08/30

## 2017-02-07 LAB — CLOSTRIDIUM DIFFICILE BY PCR: CDIFFPCR: NOT DETECTED

## 2017-02-08 LAB — STOOL CULTURE

## 2017-02-10 ENCOUNTER — Other Ambulatory Visit: Payer: Self-pay | Admitting: Internal Medicine

## 2017-02-10 DIAGNOSIS — J45909 Unspecified asthma, uncomplicated: Secondary | ICD-10-CM

## 2017-02-11 ENCOUNTER — Other Ambulatory Visit: Payer: Self-pay | Admitting: Internal Medicine

## 2017-02-11 ENCOUNTER — Other Ambulatory Visit: Payer: Self-pay

## 2017-02-11 NOTE — Telephone Encounter (Addendum)
Pt cleared for surgery- form signed and faxed to [facility] at [phone]. Form sent for scanning.

## 2017-02-12 DIAGNOSIS — M25511 Pain in right shoulder: Secondary | ICD-10-CM | POA: Diagnosis not present

## 2017-02-17 ENCOUNTER — Encounter: Payer: Self-pay | Admitting: Internal Medicine

## 2017-02-28 ENCOUNTER — Ambulatory Visit: Payer: Medicare Other | Admitting: Internal Medicine

## 2017-03-02 ENCOUNTER — Other Ambulatory Visit: Payer: Self-pay | Admitting: Internal Medicine

## 2017-03-26 ENCOUNTER — Ambulatory Visit: Payer: Medicare Other | Admitting: Internal Medicine

## 2017-04-11 ENCOUNTER — Other Ambulatory Visit: Payer: Self-pay | Admitting: Internal Medicine

## 2017-04-16 ENCOUNTER — Ambulatory Visit: Payer: Medicare Other | Admitting: Nurse Practitioner

## 2017-05-12 ENCOUNTER — Other Ambulatory Visit: Payer: Self-pay

## 2017-05-12 MED ORDER — DILTIAZEM HCL 120 MG PO TABS: 120.0000 mg | ORAL_TABLET | Freq: Every day | ORAL | 3 refills | Status: DC

## 2017-05-13 ENCOUNTER — Other Ambulatory Visit: Payer: Self-pay | Admitting: *Deleted

## 2017-05-17 ENCOUNTER — Other Ambulatory Visit: Payer: Self-pay | Admitting: Internal Medicine

## 2017-05-23 DIAGNOSIS — H18423 Band keratopathy, bilateral: Secondary | ICD-10-CM | POA: Diagnosis not present

## 2017-05-23 DIAGNOSIS — H2513 Age-related nuclear cataract, bilateral: Secondary | ICD-10-CM | POA: Diagnosis not present

## 2017-05-23 DIAGNOSIS — Z8673 Personal history of transient ischemic attack (TIA), and cerebral infarction without residual deficits: Secondary | ICD-10-CM | POA: Diagnosis not present

## 2017-05-29 ENCOUNTER — Other Ambulatory Visit: Payer: Self-pay

## 2017-05-29 MED ORDER — METOPROLOL TARTRATE 25 MG PO TABS: 25.0000 mg | ORAL_TABLET | Freq: Two times a day (BID) | ORAL | 1 refills | Status: DC

## 2017-06-21 ENCOUNTER — Encounter (HOSPITAL_COMMUNITY): Payer: Self-pay

## 2017-06-21 ENCOUNTER — Other Ambulatory Visit: Payer: Self-pay

## 2017-06-21 ENCOUNTER — Emergency Department (HOSPITAL_COMMUNITY): Payer: Medicare Other

## 2017-06-21 ENCOUNTER — Observation Stay (HOSPITAL_COMMUNITY)
Admission: EM | Admit: 2017-06-21 | Discharge: 2017-06-23 | Disposition: A | Discharge status: Home / Self Care | Payer: Medicare Other | Attending: Cardiovascular Disease | Admitting: Cardiovascular Disease

## 2017-06-21 DIAGNOSIS — F0390 Unspecified dementia without behavioral disturbance: Secondary | ICD-10-CM | POA: Insufficient documentation

## 2017-06-21 DIAGNOSIS — Z96659 Presence of unspecified artificial knee joint: Secondary | ICD-10-CM | POA: Insufficient documentation

## 2017-06-21 DIAGNOSIS — R079 Chest pain, unspecified: Secondary | ICD-10-CM | POA: Diagnosis not present

## 2017-06-21 DIAGNOSIS — I5032 Chronic diastolic (congestive) heart failure: Secondary | ICD-10-CM | POA: Diagnosis not present

## 2017-06-21 DIAGNOSIS — J45909 Unspecified asthma, uncomplicated: Secondary | ICD-10-CM | POA: Insufficient documentation

## 2017-06-21 DIAGNOSIS — I110 Hypertensive heart disease with heart failure: Secondary | ICD-10-CM | POA: Diagnosis not present

## 2017-06-21 DIAGNOSIS — E785 Hyperlipidemia, unspecified: Secondary | ICD-10-CM | POA: Insufficient documentation

## 2017-06-21 DIAGNOSIS — Z7901 Long term (current) use of anticoagulants: Secondary | ICD-10-CM | POA: Diagnosis not present

## 2017-06-21 DIAGNOSIS — I252 Old myocardial infarction: Secondary | ICD-10-CM | POA: Diagnosis not present

## 2017-06-21 DIAGNOSIS — K219 Gastro-esophageal reflux disease without esophagitis: Secondary | ICD-10-CM | POA: Diagnosis not present

## 2017-06-21 DIAGNOSIS — Z87891 Personal history of nicotine dependence: Secondary | ICD-10-CM | POA: Diagnosis not present

## 2017-06-21 DIAGNOSIS — Z8673 Personal history of transient ischemic attack (TIA), and cerebral infarction without residual deficits: Secondary | ICD-10-CM | POA: Diagnosis not present

## 2017-06-21 DIAGNOSIS — I482 Chronic atrial fibrillation: Secondary | ICD-10-CM | POA: Diagnosis not present

## 2017-06-21 DIAGNOSIS — Z951 Presence of aortocoronary bypass graft: Secondary | ICD-10-CM | POA: Insufficient documentation

## 2017-06-21 DIAGNOSIS — Z79899 Other long term (current) drug therapy: Secondary | ICD-10-CM | POA: Insufficient documentation

## 2017-06-21 DIAGNOSIS — Z7951 Long term (current) use of inhaled steroids: Secondary | ICD-10-CM | POA: Insufficient documentation

## 2017-06-21 DIAGNOSIS — E119 Type 2 diabetes mellitus without complications: Secondary | ICD-10-CM | POA: Insufficient documentation

## 2017-06-21 DIAGNOSIS — I480 Paroxysmal atrial fibrillation: Secondary | ICD-10-CM | POA: Insufficient documentation

## 2017-06-21 DIAGNOSIS — R001 Bradycardia, unspecified: Secondary | ICD-10-CM | POA: Diagnosis not present

## 2017-06-21 DIAGNOSIS — Z953 Presence of xenogenic heart valve: Secondary | ICD-10-CM | POA: Insufficient documentation

## 2017-06-21 DIAGNOSIS — F419 Anxiety disorder, unspecified: Secondary | ICD-10-CM | POA: Diagnosis not present

## 2017-06-21 DIAGNOSIS — I25810 Atherosclerosis of coronary artery bypass graft(s) without angina pectoris: Secondary | ICD-10-CM | POA: Diagnosis not present

## 2017-06-21 DIAGNOSIS — R072 Precordial pain: Secondary | ICD-10-CM | POA: Diagnosis not present

## 2017-06-21 LAB — BASIC METABOLIC PANEL
Anion gap: 10 (ref 5–15)
BUN: 15 mg/dL (ref 6–20)
CO2: 27 mmol/L (ref 22–32)
Calcium: 9.1 mg/dL (ref 8.9–10.3)
Chloride: 102 mmol/L (ref 101–111)
Creatinine, Ser: 1.08 mg/dL — ABNORMAL HIGH (ref 0.44–1.00)
GFR calc Af Amer: 50 mL/min — ABNORMAL LOW (ref >60)
GFR, EST NON AFRICAN AMERICAN: 43 mL/min — AB (ref >60)
GLUCOSE: 103 mg/dL — AB (ref 65–99)
POTASSIUM: 4.1 mmol/L (ref 3.5–5.1)
Sodium: 139 mmol/L (ref 135–145)

## 2017-06-21 LAB — CBC
HEMATOCRIT: 43.9 % (ref 36.0–46.0)
Hemoglobin: 14.8 g/dL (ref 12.0–15.0)
MCH: 33.9 pg (ref 26.0–34.0)
MCHC: 33.7 g/dL (ref 30.0–36.0)
MCV: 100.7 fL — AB (ref 78.0–100.0)
Platelets: 175 10*3/uL (ref 150–400)
RBC: 4.36 MIL/uL (ref 3.87–5.11)
RDW: 12.9 % (ref 11.5–15.5)
WBC: 8.1 10*3/uL (ref 4.0–10.5)

## 2017-06-21 LAB — I-STAT TROPONIN, ED: Troponin i, poc: 0.01 ng/mL (ref 0.00–0.08)

## 2017-06-21 LAB — PROTIME-INR
INR: 1.39
Prothrombin Time: 16.9 seconds — ABNORMAL HIGH (ref 11.4–15.2)

## 2017-06-21 LAB — TROPONIN I

## 2017-06-21 MED ORDER — ALPRAZOLAM 0.25 MG PO TABS: 0.2500 mg | ORAL_TABLET | Freq: Every evening | ORAL | Status: DC | PRN

## 2017-06-21 MED ORDER — APIXABAN 5 MG PO TABS
5.0000 mg | ORAL_TABLET | Freq: Two times a day (BID) | ORAL | Status: DC
  Administered 2017-06-21 – 2017-06-23 (×4): 5 mg via ORAL
  Filled 2017-06-21 (×4): qty 1

## 2017-06-21 MED ORDER — METOPROLOL TARTRATE 25 MG PO TABS
25.0000 mg | ORAL_TABLET | Freq: Two times a day (BID) | ORAL | Status: DC
  Administered 2017-06-22 – 2017-06-23 (×3): 25 mg via ORAL
  Filled 2017-06-21 (×3): qty 1

## 2017-06-21 MED ORDER — NITROGLYCERIN 0.4 MG SL SUBL: 0.4000 mg | SUBLINGUAL_TABLET | SUBLINGUAL | Status: DC | PRN

## 2017-06-21 MED ORDER — ATORVASTATIN CALCIUM 40 MG PO TABS
40.0000 mg | ORAL_TABLET | Freq: Every day | ORAL | Status: DC
  Administered 2017-06-21 – 2017-06-22 (×2): 40 mg via ORAL
  Filled 2017-06-21 (×2): qty 1

## 2017-06-21 MED ORDER — POTASSIUM CHLORIDE CRYS ER 10 MEQ PO TBCR
10.0000 meq | EXTENDED_RELEASE_TABLET | Freq: Every day | ORAL | Status: DC
  Administered 2017-06-21 – 2017-06-22 (×2): 10 meq via ORAL
  Filled 2017-06-21 (×2): qty 1

## 2017-06-21 MED ORDER — ASPIRIN EC 81 MG PO TBEC
81.0000 mg | DELAYED_RELEASE_TABLET | Freq: Every day | ORAL | Status: DC
  Administered 2017-06-22 – 2017-06-23 (×2): 81 mg via ORAL
  Filled 2017-06-21 (×2): qty 1

## 2017-06-21 MED ORDER — ACETAMINOPHEN 500 MG PO TABS: 1000.0000 mg | ORAL_TABLET | Freq: Four times a day (QID) | ORAL | Status: DC | PRN

## 2017-06-21 MED ORDER — BUDESONIDE 0.5 MG/2ML IN SUSP
0.5000 mg | Freq: Every day | RESPIRATORY_TRACT | Status: DC
  Administered 2017-06-22 – 2017-06-23 (×2): 0.5 mg via RESPIRATORY_TRACT
  Filled 2017-06-21 (×2): qty 2

## 2017-06-21 MED ORDER — ONDANSETRON HCL 4 MG/2ML IJ SOLN: 4.0000 mg | Freq: Four times a day (QID) | INTRAMUSCULAR | Status: DC | PRN

## 2017-06-21 MED ORDER — DILTIAZEM HCL ER COATED BEADS 120 MG PO CP24
120.0000 mg | ORAL_CAPSULE | Freq: Every day | ORAL | Status: DC
  Administered 2017-06-22 – 2017-06-23 (×2): 120 mg via ORAL
  Filled 2017-06-21 (×2): qty 1

## 2017-06-21 MED ORDER — PANTOPRAZOLE SODIUM 40 MG PO TBEC
40.0000 mg | DELAYED_RELEASE_TABLET | Freq: Every day | ORAL | Status: DC
  Administered 2017-06-22 – 2017-06-23 (×2): 40 mg via ORAL
  Filled 2017-06-21 (×2): qty 1

## 2017-06-21 MED ORDER — BENAZEPRIL HCL 20 MG PO TABS
20.0000 mg | ORAL_TABLET | Freq: Every day | ORAL | Status: DC
  Filled 2017-06-21: qty 1

## 2017-06-21 MED ORDER — FUROSEMIDE 40 MG PO TABS
40.0000 mg | ORAL_TABLET | Freq: Every day | ORAL | Status: DC
  Administered 2017-06-22 – 2017-06-23 (×2): 40 mg via ORAL
  Filled 2017-06-21 (×3): qty 1

## 2017-06-21 NOTE — ED Notes (Signed)
ED Provider at bedside. 

## 2017-06-21 NOTE — ED Triage Notes (Signed)
Pt arrived via [facility] from home where daughter reports pt c/o CP since yesterday. Daughter did give one nitro yesterday. Pt c/o CP again today and EMS was called. EMS gave 1 Nitro and 324 ASA PTA. CP went from 5/10 to 1/10. Pt has significant cardiac history per EMS.

## 2017-06-21 NOTE — ED Provider Notes (Signed)
[facility] Provider Note   CSN: [id number] Arrival date & time: 06/21/17  1504     History   Chief Complaint Chief Complaint  Patient presents with  . Chest Pain    HPI Rebecca Skinner is a 84 y.o. female.  HPI   Pt is a 84 y/o F with a h/o CAD s/p CABG, HLD, HTN, pAFib (on eliquis) who presents to the ED accompanied by her daughter c/o a 2 day h/o midsternal chest pain. Pt states that pain radiates to her right shoulder. She denies any shortness of breath, nausea, vomiting.  Daughter gave her 1 nitro yesterday which improved her chest pain.  She was also given 1 nitro and 325 ASA and route to the ED via EMS.  EMS documented that chest pain went from size 5/10-1/10.  Denies any other symptoms including no fevers, URI symptoms, cough, abdominal pain, diarrhea, constipation, urinary or vaginal symptoms, no lower extremity swelling, pain, redness.  Past Medical History:  Diagnosis Date  . Anxiety 11/20/2011  . Asthma    UNDER [facility] DR PAZ  . CAD (coronary artery disease)    s/p CABG s/p AO valve replacement ([place] [facility])  . CVA (cerebral infarction) 08-2013   multiple, L, d/t Afib, started eloquis  . Diabetes mellitus    type 2   . Dizziness    Chronic, admiet 07-2010,saw neuro, thought to be a peripheral issue   . Dry skin   . GERD (gastroesophageal reflux disease)   . Hyperlipidemia   . Hypertension   . Hyperthyroidism   . Keloid    @ chest  . Memory loss   . Osteoarthritis   . Osteopenia    per dexa 12/09  . Paroxysmal atrial fibrillation ([place])    cards d/c coumadin 12/2008 d/t persisten NSR and frequent falls- restarted coumadin july 2011, now on Eliquis  . Recurrent UTI   . Shortness of breath dyspnea   . TIA (transient ischemic attack)     Patient Active Problem List   Diagnosis Date Noted  . Chest pain 06/21/2017  . TIA (transient ischemic attack) 08/30/2016  . Acute kidney injury ([place]) 03/05/2016  . Exposure keratitis  09/23/2015  . Keratopathy, band 09/13/2015  . PCP NOTES >>>>> 04/05/2015  . Chronic diastolic heart failure ([place]) 12/29/2014  . Edema 12/20/2014  . Dyspnea 12/20/2014  . Angiectasia 03/30/2014  . GI bleed 03/28/2014  . Dementia 09/16/2013  . Annual physical exam 11/21/2011  . ATRIAL FIBRILLATION, chronic 03/05/2007  . Diabetes mellitus with circulatory complication ([place]) 79/89/2119  . S/P AVR (aortic valve replacement) 10/15/2006  . Hyperthyroidism 09/03/2006  . Dyslipidemia 09/03/2006  . HTN (hypertension) 09/03/2006  . CAD (coronary artery disease) of artery bypass graft 09/03/2006  . Asthma, chronic 09/03/2006    Past Surgical History:  Procedure Laterality Date  . ABDOMINAL HYSTERECTOMY    . AORTIC VALVE REPLACEMENT  1998  . CARDIAC VALVE REPLACEMENT    . CAROTID DOPPLER  07/13/12   BILATERAL BULB/PROXIMAL ICAS;MILD AMOUNT OF FIBROUS PLAQUE WITH NO DIAMETER REDUCTION.  . CESAREAN SECTION     x 2  . COLONOSCOPY WITH PROPOFOL N/A 03/30/2014   Procedure: COLONOSCOPY WITH PROPOFOL;  Surgeon: Irene Shipper, MD;  Location: [facility];  Service: Endoscopy;  Laterality: N/A;  . CORONARY ARTERY BYPASS GRAFT  1998   SVG TO RCA  . HEMORRHOID SURGERY    . JOINT REPLACEMENT    . MYOCARDIAL PERFUSION STUDY  12/19/09   NORMAL PATTERN OF PERFUSION IN ALL REGIONS.EF 75%.  . OOPHORECTOMY    . TOTAL KNEE ARTHROPLASTY  1999  . TRANSTHORACIC ECHOCARDIOGRAM  09/11/11   LVEF >55%.STAGE 1 DIASTOLIC DYSFUNCTION,ELEVATED LV FILLING PRESSURE.BIOPROSTHETIC AORTIC VALVE-PEAK AND MEAN GRADIENTS OF 17 MMHG AND 8 MMHG.SIGMOID SEPTUM.MILD TO MOD MR.MILD TO MOD TR.RVSP 46 MMHG.    OB History    Gravida Para Term Preterm AB Living   4 4           SAB TAB Ectopic Multiple Live Births                   Home Medications    Prior to Admission medications   Medication Sig Start Date End Date Taking? Authorizing Provider  acetaminophen (TYLENOL) 500 MG tablet Take 1,000 mg by mouth every 6 (six) hours  as needed for moderate pain (pain).   Yes [provider]  ALPRAZolam Duanne Moron) 0.25 MG tablet 1/2 or 1 tablet by mouth at night as needed for insomnia 12/10/16  Yes Colon Branch, MD  atorvastatin (LIPITOR) 40 MG tablet Take 1 tablet (40 mg total) by mouth at bedtime. 04/11/17  Yes Paz, Jacqulyn Bath E, MD  benazepril (LOTENSIN) 20 MG tablet TAKE 1 TABLET BY MOUTH DAILY Patient taking differently: TAKE 20 ng TABLET BY MOUTH DAILY 08/15/16  Yes Troy Sine, MD  budesonide (PULMICORT) 0.5 MG/2ML nebulizer solution USE 1 VIAL VIA NEBULIZER TWICE DAILY Patient taking differently: Take 0.5 mg by nebulization daily. USE 1 VIAL VIA NEBULIZER TWICE DAILY 02/11/17  Yes Paz, Alda Berthold, MD  Calcium Carbonate-Vitamin D (CALCIUM 500 + D) 500-125 MG-UNIT TABS Take 1 tablet by mouth at bedtime.   Yes [provider]  cholecalciferol (VITAMIN D) 1000 UNITS tablet Take 1,000 Units by mouth at bedtime.    Yes [provider]  diltiazem (CARDIZEM CD) 120 MG 24 hr capsule Take 120 mg by mouth daily. 05/13/17  Yes [provider]  ELIQUIS 5 MG TABS tablet TAKE 1 TABLET BY MOUTH TWICE A DAY Patient taking differently: TAKE 5mg  TABLET BY MOUTH TWICE A DAY 05/19/17  Yes Paz, Alda Berthold, MD  esomeprazole (NEXIUM) 40 MG capsule Take 1 capsule (40 mg total) by mouth daily before breakfast. Patient taking differently: Take 40 mg by mouth daily as needed.  09/16/16  Yes Paz, Alda Berthold, MD  furosemide (LASIX) 20 MG tablet Take 2 tablets (40 mg total) by mouth daily. 02/11/17  Yes Colon Branch, MD  Magnesium Oxide 400 (240 Mg) MG TABS Take 1 tablet (400 mg total) by mouth daily. Patient taking differently: Take 1 tablet by mouth at bedtime.  01/30/17  Yes Paz, Alda Berthold, MD  methimazole (TAPAZOLE) 5 MG tablet Take 1 tablet (5 mg total) by mouth 3 (three) times a week. 09/27/16  Yes Paz, Alda Berthold, MD  metoprolol tartrate (LOPRESSOR) 25 MG tablet Take 1 tablet (25 mg total) by mouth 2 (two) times daily. 05/29/17  Yes Paz, Alda Berthold, MD   nitroGLYCERIN (NITROSTAT) 0.4 MG SL tablet Place 1 tablet (0.4 mg total) under the tongue every 5 (five) minutes x 3 doses as needed for chest pain. 12/13/16  Yes Paz, Alda Berthold, MD  potassium chloride (KLOR-CON M10) 10 MEQ tablet Take 1 tablet (10 mEq total) by mouth daily. Patient taking differently: Take 10 mEq by mouth at bedtime.  01/30/17  Yes Paz, Alda Berthold, MD  cefdinir (OMNICEF) 300 MG capsule Take 1 capsule (300 mg total) by  mouth 2 (two) times daily. Patient not taking: Reported on 06/21/2017 02/03/17   Mosie Lukes, MD  diclofenac sodium (VOLTAREN) 1 % GEL Apply 2 g topically 4 (four) times daily. Patient not taking: Reported on 06/21/2017 02/03/17   Tenna Delaine D, PA-C  diltiazem (CARDIZEM) 120 MG tablet Take 1 tablet (120 mg total) by mouth daily. Patient not taking: Reported on 06/21/2017 05/12/17   Troy Sine, MD  ketoconazole (NIZORAL) 2 % cream Apply 1 application topically daily. Patient not taking: Reported on 06/21/2017 01/07/17   Colon Branch, MD  levalbuterol Penne Lash) 0.63 MG/3ML nebulizer solution Take 3 mLs (0.63 mg total) by nebulization 2 (two) times daily. Patient not taking: Reported on 06/21/2017 11/10/15   Colon Branch, MD  lidocaine (LIDODERM) 5 % Place 1 patch onto the skin daily. Remove & Discard patch within 12 hours or as directed by MD Patient not taking: Reported on 06/21/2017 02/03/17   Leonie Douglas, PA-C    Family History Family History  Problem Relation Age of Onset  . Heart attack Father   . Coronary artery disease Son 4       deceased  . Hypertension Brother   . Stroke Brother   . Colon cancer Neg Hx   . Breast cancer Neg Hx   . Thyroid disease Neg Hx     Social History Social History   Tobacco Use  . Smoking status: Former Research scientist (life sciences)  . Smokeless tobacco: Never Used  . Tobacco comment: quit 2004, smoked 1.5 ppd  Substance Use Topics  . Alcohol use: No    Alcohol/week: 0.0 oz  . Drug use: No     Allergies   Hydrocodone and Tramadol  hcl   Review of Systems Review of Systems  Constitutional: Negative for chills and fever.  HENT: Negative for ear pain and sore throat.   Eyes: Negative for pain and visual disturbance.  Respiratory: Negative for cough, shortness of breath and wheezing.   Cardiovascular: Positive for chest pain. Negative for palpitations.  Gastrointestinal: Negative for abdominal pain, constipation, diarrhea, nausea and vomiting.  Genitourinary: Negative for dysuria, flank pain, hematuria, pelvic pain, urgency, vaginal bleeding and vaginal discharge.  Musculoskeletal: Negative for arthralgias and back pain.  Skin: Negative for color change and rash.  Neurological: Negative for dizziness, seizures, syncope, weakness, light-headedness, numbness and headaches.  All other systems reviewed and are negative.    Physical Exam Updated Vital Signs BP (!) 175/88   Pulse 62   Temp 98.7 F (37.1 C) (Oral)   Resp 19   Ht 5\' 2"  (1.575 m)   Wt 74.3 kg (163 lb 12.8 oz)   SpO2 99%   BMI 29.96 kg/m   Physical Exam  Constitutional: She appears well-developed and well-nourished. No distress.  HENT:  Head: Normocephalic and atraumatic.  Eyes: Conjunctivae are normal.  Neck: Normal range of motion. Neck supple.  Cardiovascular: Normal rate, regular rhythm and intact distal pulses.  No murmur heard. Pulmonary/Chest: Effort normal and breath sounds normal. No respiratory distress. She has no decreased breath sounds. She has no wheezes. She has no rhonchi. She has no rales.  No chest TTP.   Abdominal: Soft. There is no tenderness.  Musculoskeletal: She exhibits no edema.  No calf TTP, erythema, swelling.  Neurological: She is alert.  Mental Status:  Alert, thought content appropriate, able to give a coherent history. Speech fluent without evidence of aphasia. Able to follow 2 step commands without difficulty.  Cranial Nerves:  II:  pupils equal, round, reactive to light III,IV, VI: ptosis not present,  extra-ocular motions intact bilaterally  V,VII: smile symmetric, facial light touch sensation equal VIII: hearing grossly normal to voice  X: uvula elevates symmetrically  XI: bilateral shoulder shrug symmetric and strong XII: midline tongue extension without fassiculations Motor:  Normal tone. 5/5 strength of BUE and BLE major muscle groups including strong and equal grip strength and dorsiflexion/plantar flexion Sensory: light touch normal in all extremities. CV: 2+ radial and DP/PT pulses  Oriented to person, place, but not time (chronic per family)  Skin: Skin is warm and dry.  Well healed scar to mid chest from prior cabg  Psychiatric: She has a normal mood and affect.  Nursing note and vitals reviewed.    ED Treatments / Results  Labs (all labs ordered are listed, but only abnormal results are displayed) Labs Reviewed  BASIC METABOLIC PANEL - Abnormal; Notable for the following components:      Result Value   Glucose, Bld 103 (*)    Creatinine, Ser 1.08 (*)    GFR calc non Af Amer 43 (*)    GFR calc Af Amer 50 (*)    All other components within normal limits  CBC - Abnormal; Notable for the following components:   MCV 100.7 (*)    All other components within normal limits  PROTIME-INR - Abnormal; Notable for the following components:   Prothrombin Time 16.9 (*)    All other components within normal limits  TROPONIN I  TROPONIN I  TROPONIN I  I-STAT TROPONIN, ED    EKG  EKG Interpretation  Date/Time:  Saturday June 21 2017 15:06:31 EST Ventricular Rate:  64 PR Interval:    QRS Duration: 86 QT Interval:  450 QTC Calculation: 465 R Axis:   36 Text Interpretation:  Atrial fibrillation When compared to prior, no significant changes other than less PVC./  No STEMI Confirmed by Antony Blackbird [phone]) on 06/21/2017 3:31:51 PM       Radiology Dg Chest 2 View  Result Date: 06/21/2017 CLINICAL DATA:  Chest pain EXAM: CHEST  2 VIEW COMPARISON:  01/30/2017  FINDINGS: The heart is markedly enlarged. Postoperative changes from CABG and aortic valvular replacement. Pulmonary vascularity is within normal limits. Clear lungs. No pneumothorax. No pleural effusion. IMPRESSION: Cardiomegaly without decompensation. Electronically Signed   By: Marybelle Killings M.D.   On: 06/21/2017 15:54    Procedures Procedures (including critical care time)  Medications Ordered in ED Medications  acetaminophen (TYLENOL) tablet 1,000 mg (not administered)  ALPRAZolam (XANAX) tablet 0.25 mg (not administered)  atorvastatin (LIPITOR) tablet 40 mg (40 mg Oral Given 06/21/17 2152)  benazepril (LOTENSIN) tablet 20 mg (20 mg Oral Not Given 06/21/17 2123)  budesonide (PULMICORT) nebulizer solution 0.5 mg (not administered)  diltiazem (CARDIZEM CD) 24 hr capsule 120 mg (not administered)  apixaban (ELIQUIS) tablet 5 mg (5 mg Oral Given 06/21/17 2152)  pantoprazole (PROTONIX) EC tablet 40 mg (40 mg Oral Not Given 06/21/17 2123)  furosemide (LASIX) tablet 40 mg (not administered)  metoprolol tartrate (LOPRESSOR) tablet 25 mg (not administered)  nitroGLYCERIN (NITROSTAT) SL tablet 0.4 mg (not administered)  potassium chloride (K-DUR,KLOR-CON) CR tablet 10 mEq (10 mEq Oral Given 06/21/17 2152)  ondansetron (ZOFRAN) injection 4 mg (not administered)  aspirin EC tablet 81 mg (not administered)     Initial Impression / Assessment and Plan / ED Course  I have reviewed the triage vital signs and the nursing notes.  Pertinent labs & imaging results  that were available during my care of the patient were reviewed by me and considered in my medical decision making (see chart for details).  Discussed patient presentation and exam with Dr. Sherry Ruffing. He agrees with the plan and will see the pt.    16:55 Dr. Sherry Ruffing evaluated the pt and states that she is currently CP free.   Dr Sherry Ruffing discussed patient case with, Dr. Marya Amsler Mean, cardiology will admit the patient to his service.  Rechecked  patient.  Vital signs are stable.  She has no continued complaints of chest pain.  Discussed the plan for admission to cardiology.  Discussed the lab, EKG, and imaging results.  Patient and family have no further questions.  Final Clinical Impressions(s) / ED Diagnoses   Final diagnoses:  Chest pain, unspecified type   84 year old female with significant cardiac history presenting to the ED complaining of a 2-day history of chest pain.  No associated shortness of breath, nausea, vomiting, diaphoresis.  Initial troponin, CBC, BMP, PT/INR within normal limits, with the exception of a mildly elevated creatinine to 1.08.  Chest x-ray with cardiomegaly but no evidence of decompensation.  No evidence of pulmonary edema.  Initial EKG showed atrial fibrillation.  Repeat EKG with normal sinus rhythm at a heart rate of 58. Cardiology consulted and will admit the pt to their service for further workup.   ED Discharge Orders    None       Bishop Dublin 06/21/17 [id number]    Tegeler, Gwenyth Allegra, MD 06/22/17 [phone]

## 2017-06-21 NOTE — ED Notes (Signed)
Patient transported to X-ray 

## 2017-06-21 NOTE — H&P (Signed)
History and Physical  Primary Cardiologist:Kelly PCP: Colon Branch, MD  Chief Complaint: Chest Pain  HPI:  84 YO female with history of CAD and CABG with AVR (BP) in 2015 with single vessel SVG to RCA at that time.  Also with AF, recurrent CVA.  Currently on Eliquis 5 BID and over time off and on various anti-platelets.  Has also had previous falls, GI bleeds but currently on [facility] after risk/benefit discussion.   She is historically largely asymptomatic from her AF.  She presents after feeling central chest pain at rest starting yesterday coming and going at various points.  Radiated to right upper neck.  No N/V, sweats, or other associated symptoms.  Not A/W eating.  Nothing made better than NTG SL.    It is very unusual for her to have chest pains, not something she has dealt with over the years, prompting her and her daughters concerns.  Since EMS SL NTG, no recurrence of symptoms.  No recent GI bleeding. No edema, SOB, palpitations, syncope.  No other concerning signs or symptoms.   BP is elevated today in ED, family unsure if this is a chronic problem, and do not check regularly.    Prior Cardiac Studies:  Past Medical History:  Diagnosis Date  . Anxiety 11/20/2011  . Asthma    UNDER THE CARE [facility] DR PAZ  . CAD (coronary artery disease)    s/p CABG s/p AO valve replacement ([place] [facility])  . CVA (cerebral infarction) 08-2013   multiple, L, d/t Afib, started eloquis  . Diabetes mellitus    type 2   . Dizziness    Chronic, admiet 07-2010,saw neuro, thought to be a peripheral issue   . Dry skin   . GERD (gastroesophageal reflux disease)   . Hyperlipidemia   . Hypertension   . Hyperthyroidism   . Keloid    @ chest  . Memory loss   . Osteoarthritis   . Osteopenia    per dexa 12/09  . Paroxysmal atrial fibrillation ([place])    cards d/c coumadin 12/2008 d/t persisten NSR and frequent falls- restarted coumadin july 2011, now on Eliquis  . Recurrent UTI   . Shortness of breath  dyspnea   . TIA (transient ischemic attack)     Past Surgical History:  Procedure Laterality Date  . ABDOMINAL HYSTERECTOMY    . AORTIC VALVE REPLACEMENT  1998  . CARDIAC VALVE REPLACEMENT    . CAROTID DOPPLER  07/13/12   BILATERAL BULB/PROXIMAL ICAS;MILD AMOUNT OF FIBROUS PLAQUE WITH NO DIAMETER REDUCTION.  . CESAREAN SECTION     x 2  . COLONOSCOPY WITH PROPOFOL N/A 03/30/2014   Procedure: COLONOSCOPY WITH PROPOFOL;  Surgeon: Irene Shipper, MD;  Location: [facility] END[facility]COPY;  Service: Endoscopy;  Laterality: N/A;  . CORONARY ARTERY BYPASS GRAFT  1998   SVG TO RCA  . HEMORRHOID SURGERY    . JOINT REPLACEMENT    . MYOCARDIAL PERFUSION STUDY  12/19/09   NORMAL PATTERN OF PERFUSION IN ALL REGIONS.EF 75%.  . OOPHORECTOMY    . TOTAL KNEE ARTHROPLASTY  1999  . TRANSTHORACIC ECHOCARDIOGRAM  09/11/11   LVEF >55%.STAGE 1 DIASTOLIC DYSFUNCTION,ELEVATED LV FILLING PRESSURE.BIOPROSTHETIC AORTIC VALVE-PEAK AND MEAN GRADIENTS OF 17 MMHG AND 8 MMHG.SIGMOID SEPTUM.MILD TO MOD MR.MILD TO MOD TR.RVSP 46 MMHG.    Family History  Problem Relation Age of Onset  . Heart attack Father   . Coronary artery disease Son 15       deceased  .  Hypertension Brother   . Stroke Brother   . Colon cancer Neg Hx   . Breast cancer Neg Hx   . Thyroid disease Neg Hx    Social History:  reports that she has quit smoking. she has never used smokeless tobacco. She reports that she does not drink alcohol or use drugs.  Allergies:  Allergies  Allergen Reactions  . Hydrocodone Itching and Nausea Only  . Tramadol Hcl Itching and Nausea Only    No current facility-administered medications on file prior to encounter.    Current Outpatient Medications on File Prior to Encounter  Medication Sig Dispense Refill  . acetaminophen (TYLENOL) 500 MG tablet Take 1,000 mg by mouth every 6 (six) hours as needed for moderate pain (pain).    Marland Kitchen ALPRAZolam (XANAX) 0.25 MG tablet 1/2 or 1 tablet by mouth at night as needed for insomnia 30  tablet 0  . atorvastatin (LIPITOR) 40 MG tablet Take 1 tablet (40 mg total) by mouth at bedtime. 90 tablet 0  . benazepril (LOTENSIN) 20 MG tablet TAKE 1 TABLET BY MOUTH DAILY (Patient taking differently: TAKE 20 ng TABLET BY MOUTH DAILY) 90 tablet 3  . budesonide (PULMICORT) 0.5 MG/2ML nebulizer solution USE 1 VIAL VIA NEBULIZER TWICE DAILY (Patient taking differently: Take 0.5 mg by nebulization daily. USE 1 VIAL VIA NEBULIZER TWICE DAILY) 120 mL 5  . Calcium Carbonate-Vitamin D (CALCIUM 500 + D) 500-125 MG-UNIT TABS Take 1 tablet by mouth at bedtime.    . cholecalciferol (VITAMIN D) 1000 UNITS tablet Take 1,000 Units by mouth at bedtime.     Marland Kitchen diltiazem (CARDIZEM CD) 120 MG 24 hr capsule Take 120 mg by mouth daily.  3  . ELIQUIS 5 MG TABS tablet TAKE 1 TABLET BY MOUTH TWICE A DAY (Patient taking differently: TAKE 23m TABLET BY MOUTH TWICE A DAY) 60 tablet 3  . esomeprazole (NEXIUM) 40 MG capsule Take 1 capsule (40 mg total) by mouth daily before breakfast. (Patient taking differently: Take 40 mg by mouth daily as needed. ) 90 capsule 1  . furosemide (LASIX) 20 MG tablet Take 2 tablets (40 mg total) by mouth daily. 180 tablet 1  . Magnesium Oxide 400 (240 Mg) MG TABS Take 1 tablet (400 mg total) by mouth daily. (Patient taking differently: Take 1 tablet by mouth at bedtime. ) 90 tablet 1  . methimazole (TAPAZOLE) 5 MG tablet Take 1 tablet (5 mg total) by mouth 3 (three) times a week. 40 tablet 3  . metoprolol tartrate (LOPRESSOR) 25 MG tablet Take 1 tablet (25 mg total) by mouth 2 (two) times daily. 180 tablet 1  . nitroGLYCERIN (NITROSTAT) 0.4 MG SL tablet Place 1 tablet (0.4 mg total) under the tongue every 5 (five) minutes x 3 doses as needed for chest pain. 25 tablet 3  . potassium chloride (KLOR-CON M10) 10 MEQ tablet Take 1 tablet (10 mEq total) by mouth daily. (Patient taking differently: Take 10 mEq by mouth at bedtime. ) 90 tablet 1  . cefdinir (OMNICEF) 300 MG capsule Take 1 capsule (300  mg total) by mouth 2 (two) times daily. (Patient not taking: Reported on 06/21/2017) 10 capsule 0  . diclofenac sodium (VOLTAREN) 1 % GEL Apply 2 g topically 4 (four) times daily. (Patient not taking: Reported on 06/21/2017) 100 g 0  . diltiazem (CARDIZEM) 120 MG tablet Take 1 tablet (120 mg total) by mouth daily. (Patient not taking: Reported on 06/21/2017) 90 tablet 3  . ketoconazole (NIZORAL) 2 % cream  Apply 1 application topically daily. (Patient not taking: Reported on 06/21/2017) 30 g 3  . levalbuterol (XOPENEX) 0.63 MG/3ML nebulizer solution Take 3 mLs (0.63 mg total) by nebulization 2 (two) times daily. (Patient not taking: Reported on 06/21/2017) 180 mL 5  . lidocaine (LIDODERM) 5 % Place 1 patch onto the skin daily. Remove & Discard patch within 12 hours or as directed by MD (Patient not taking: Reported on 06/21/2017) 15 patch 0    Results for orders placed or performed during the hospital encounter of 06/21/17 (from the past 48 hour(s))  I-stat troponin, ED     Status: None   Collection Time: 06/21/17  3:37 PM  Result Value Ref Range   Troponin i, poc 0.01 0.00 - 0.08 ng/mL   Comment 3            Comment: Due to the release kinetics of cTnI, a negative result within the first hours of the onset of symptoms does not rule out myocardial infarction with certainty. If myocardial infarction is still suspected, repeat the test at appropriate intervals.   Basic metabolic panel     Status: Abnormal   Collection Time: 06/21/17  3:53 PM  Result Value Ref Range   Sodium 139 135 - 145 mmol/L   Potassium 4.1 3.5 - 5.1 mmol/L   Chloride 102 101 - 111 mmol/L   CO2 27 22 - 32 mmol/L   Glucose, Bld 103 (H) 65 - 99 mg/dL   BUN 15 6 - 20 mg/dL   Creatinine, Ser 1.08 (H) 0.44 - 1.00 mg/dL   Calcium 9.1 8.9 - 10.3 mg/dL   GFR calc non Af Amer 43 (L) >60 mL/min   GFR calc Af Amer 50 (L) >60 mL/min    Comment: (NOTE) The eGFR has been calculated using the CKD EPI equation. This calculation has not  been validated in all clinical situations. eGFR's persistently <60 mL/min signify possible Chronic Kidney Disease.    Anion gap 10 5 - 15  CBC     Status: Abnormal   Collection Time: 06/21/17  3:53 PM  Result Value Ref Range   WBC 8.1 4.0 - 10.5 K/uL   RBC 4.36 3.87 - 5.11 MIL/uL   Hemoglobin 14.8 12.0 - 15.0 g/dL   HCT 43.9 36.0 - 46.0 %   MCV 100.7 (H) 78.0 - 100.0 fL   MCH 33.9 26.0 - 34.0 pg   MCHC 33.7 30.0 - 36.0 g/dL   RDW 12.9 11.5 - 15.5 %   Platelets 175 150 - 400 K/uL  Protime-INR     Status: Abnormal   Collection Time: 06/21/17  3:53 PM  Result Value Ref Range   Prothrombin Time 16.9 (H) 11.4 - 15.2 seconds   INR 1.39    Dg Chest 2 View  Result Date: 06/21/2017 CLINICAL DATA:  Chest pain EXAM: CHEST  2 VIEW COMPARISON:  01/30/2017 FINDINGS: The heart is markedly enlarged. Postoperative changes from CABG and aortic valvular replacement. Pulmonary vascularity is within normal limits. Clear lungs. No pneumothorax. No pleural effusion. IMPRESSION: Cardiomegaly without decompensation. Electronically Signed   By: Marybelle Killings M.D.   On: 06/21/2017 15:54    ECG/Tele: Afib with borderline slow ventricular response alternating with sinus rhythm/sinus bradycardia  ROS: As above. Otherwise, review of systems is negative unless per above HPI  Vitals:   06/21/17 1506 06/21/17 1507 06/21/17 1509 06/21/17 1645  BP: 113/82 113/82  (!) 176/67  Pulse: 62 64  (!) 52  Resp: (!) 21 16  19  Temp:  98.7 F (37.1 C)    TempSrc:  Oral    SpO2: 98% 98%  100%  Weight:   74.8 kg (165 lb)   Height:   '5\' 2"'  (1.575 m)    Wt Readings from Last 10 Encounters:  06/21/17 74.8 kg (165 lb)  02/03/17 75.5 kg (166 lb 8 oz)  01/30/17 75 kg (165 lb 6 oz)  01/07/17 76 kg (167 lb 8 oz)  12/17/16 75.7 kg (166 lb 12.8 oz)  12/10/16 76.4 kg (168 lb 8 oz)  10/23/16 76.2 kg (168 lb)  10/11/16 76.4 kg (168 lb 6.4 oz)  09/20/16 76.7 kg (169 lb)  09/03/16 76.7 kg (169 lb)    PE:  General: No acute  distress HEENT: Atraumatic, EOMI, mucous membranes moist. 1 cm JVD at 45 degrees above clavicle. No HJR. CV: RRR, 2/6 SEM murmur, no gallops.  Respiratory: Clear, no crackles. Normal work of breathing ABD: Non-distended and non-tender. No palpable organomegaly.  Extremities: 2+ radial pulses bilaterally. no edema. Neuro/Psych: CN grossly intact, alert and oriented  Assessment/Plan Chest pain, possible unstable angina previously now improved with SL NTG; history of prev abn nuclear scan History AVR/single vessel CABG to RCA History of pAF on eliquis alternating with sinus bradycardia History of multiple CVA/GI bleed Elevated BP (variable)  Chest pain, possible unstable angina yesterday/this AM now improved - Observation admission, trend trop - Echo - Start ASA 81 mg (may not need as outpatient pending test results)  History of pAF on eliquis alternating with sinus bradycardia - Continue home eliquis, metop, dilt  History of multiple CVA/GI bleed - as above  Elevated BP (variable) - as above + benazepril, continue to trend   Lolita Cram Heavyn Yearsley  MD 06/21/2017, 6:12 PM

## 2017-06-21 NOTE — ED Notes (Signed)
Attempted report 

## 2017-06-22 ENCOUNTER — Observation Stay (HOSPITAL_BASED_OUTPATIENT_CLINIC_OR_DEPARTMENT_OTHER): Payer: Medicare Other

## 2017-06-22 ENCOUNTER — Other Ambulatory Visit (HOSPITAL_COMMUNITY): Payer: Medicare Other

## 2017-06-22 DIAGNOSIS — I25700 Atherosclerosis of coronary artery bypass graft(s), unspecified, with unstable angina pectoris: Secondary | ICD-10-CM | POA: Diagnosis not present

## 2017-06-22 DIAGNOSIS — I25810 Atherosclerosis of coronary artery bypass graft(s) without angina pectoris: Secondary | ICD-10-CM | POA: Diagnosis not present

## 2017-06-22 DIAGNOSIS — I10 Essential (primary) hypertension: Secondary | ICD-10-CM | POA: Diagnosis not present

## 2017-06-22 DIAGNOSIS — I482 Chronic atrial fibrillation: Secondary | ICD-10-CM | POA: Diagnosis not present

## 2017-06-22 DIAGNOSIS — Z953 Presence of xenogenic heart valve: Secondary | ICD-10-CM | POA: Diagnosis not present

## 2017-06-22 DIAGNOSIS — R079 Chest pain, unspecified: Principal | ICD-10-CM

## 2017-06-22 DIAGNOSIS — I480 Paroxysmal atrial fibrillation: Secondary | ICD-10-CM

## 2017-06-22 DIAGNOSIS — I361 Nonrheumatic tricuspid (valve) insufficiency: Secondary | ICD-10-CM | POA: Diagnosis not present

## 2017-06-22 DIAGNOSIS — I110 Hypertensive heart disease with heart failure: Secondary | ICD-10-CM | POA: Diagnosis not present

## 2017-06-22 DIAGNOSIS — I5032 Chronic diastolic (congestive) heart failure: Secondary | ICD-10-CM | POA: Diagnosis not present

## 2017-06-22 LAB — ECHOCARDIOGRAM COMPLETE
Height: 62 in
WEIGHTICAEL: 2612.8 [oz_av]

## 2017-06-22 LAB — TROPONIN I: Troponin I: <0.03 ng/mL (ref <0.03)

## 2017-06-22 MED ORDER — ISOSORBIDE MONONITRATE ER 30 MG PO TB24
30.0000 mg | ORAL_TABLET | Freq: Every day | ORAL | Status: DC
  Administered 2017-06-22 – 2017-06-23 (×2): 30 mg via ORAL
  Filled 2017-06-22 (×2): qty 1

## 2017-06-22 MED ORDER — BENAZEPRIL HCL 20 MG PO TABS
20.0000 mg | ORAL_TABLET | Freq: Once | ORAL | Status: AC
  Administered 2017-06-22: 20 mg via ORAL
  Filled 2017-06-22 (×2): qty 1

## 2017-06-22 MED ORDER — BENAZEPRIL HCL 40 MG PO TABS
40.0000 mg | ORAL_TABLET | Freq: Every day | ORAL | Status: DC
  Administered 2017-06-23: 40 mg via ORAL
  Filled 2017-06-22: qty 1

## 2017-06-22 MED ORDER — BENAZEPRIL HCL 20 MG PO TABS
20.0000 mg | ORAL_TABLET | Freq: Every day | ORAL | Status: DC
  Administered 2017-06-22 (×2): 20 mg via ORAL
  Filled 2017-06-22 (×2): qty 1

## 2017-06-22 NOTE — Progress Notes (Signed)
  Echocardiogram 2D Echocardiogram has been performed.  Jennette Dubin 06/22/2017, 1:27 PM

## 2017-06-22 NOTE — Progress Notes (Signed)
Pt transferred to 2D echo

## 2017-06-22 NOTE — Progress Notes (Signed)
Pt's BP was 187/72, asymptomatic, denies chest pain, denies nausea and vomiting, not in respiratory distress. Dr. Lamona Curl was notified and ordered Benazepril 20 mg PO for now. Will continue to monitor pt.   06/22/17 0038  Vitals  Temp 99 F (37.2 C)  Temp Source Oral  BP (!) 187/72  BP Location Left Arm  BP Method Automatic  Patient Position (if appropriate) Lying  Pulse Rate 65  Pulse Rate Source Dinamap  Resp 18  Oxygen Therapy  SpO2 97 %  O2 Device Room Air

## 2017-06-22 NOTE — Progress Notes (Signed)
Progress Note  Patient Name: Rebecca Skinner Date of Encounter: 06/22/2017  Primary Cardiologist: Dr. Claiborne Billings  Subjective   Denies chest pain, palpitations, and shortness of breath. Resting comfortably.  Inpatient Medications    Scheduled Meds: . apixaban  5 mg Oral BID  . aspirin EC  81 mg Oral Daily  . atorvastatin  40 mg Oral QHS  . benazepril  20 mg Oral Daily  . budesonide  0.5 mg Nebulization Daily  . diltiazem  120 mg Oral Daily  . furosemide  40 mg Oral Daily  . metoprolol tartrate  25 mg Oral BID  . pantoprazole  40 mg Oral Daily  . potassium chloride  10 mEq Oral QHS   Continuous Infusions:  PRN Meds: acetaminophen, ALPRAZolam, nitroGLYCERIN, ondansetron (ZOFRAN) IV   Vital Signs    Vitals:   06/22/17 0421 06/22/17 0700 06/22/17 0801 06/22/17 0918  BP:  (!) 172/79 (!) 194/70 (!) 159/87  Pulse:  62 66 61  Resp:  18 20   Temp:  98.5 F (36.9 C) 98.2 F (36.8 C)   TempSrc:  Oral Oral   SpO2:  99% 97%   Weight: 163 lb 4.8 oz (74.1 kg)     Height:        Intake/Output Summary (Last 24 hours) at 06/22/2017 1030 Last data filed at 06/22/2017 0759 Gross per 24 hour  Intake 120 ml  Output 550 ml  Net -430 ml   Filed Weights   06/21/17 1509 06/21/17 2014 06/22/17 0421  Weight: 165 lb (74.8 kg) 163 lb 12.8 oz (74.3 kg) 163 lb 4.8 oz (74.1 kg)    Telemetry    A fib with PVC's - Personally Reviewed  ECG    A fib with PVC's - Personally Reviewed  Physical Exam   GEN: No acute distress.   Neck: No JVD Cardiac: Regular rate, irregular rhythm, 2/6 systolic murmur over RUSB, no rubs or gallops.  Respiratory: Clear to auscultation bilaterally. GI: Soft, nontender, non-distended  MS: No edema; No deformity. Neuro:  Nonfocal  Psych: Normal affect   Labs    Chemistry Recent Labs  Lab 06/21/17 1553  NA 139  K 4.1  CL 102  CO2 27  GLUCOSE 103*  BUN 15  CREATININE 1.08*  CALCIUM 9.1  GFRNONAA 43*  GFRAA 50*  ANIONGAP 10      Hematology Recent Labs  Lab 06/21/17 1553  WBC 8.1  RBC 4.36  HGB 14.8  HCT 43.9  MCV 100.7*  MCH 33.9  MCHC 33.7  RDW 12.9  PLT 175    Cardiac Enzymes Recent Labs  Lab 06/21/17 2043 06/22/17 0026 06/22/17 0532  TROPONINI <0.03 <0.03 <0.03    Recent Labs  Lab 06/21/17 1537  TROPIPOC 0.01     BNPNo results for input(s): BNP, PROBNP in the last 168 hours.   DDimer No results for input(s): DDIMER in the last 168 hours.   Radiology    Dg Chest 2 View  Result Date: 06/21/2017 CLINICAL DATA:  Chest pain EXAM: CHEST  2 VIEW COMPARISON:  01/30/2017 FINDINGS: The heart is markedly enlarged. Postoperative changes from CABG and aortic valvular replacement. Pulmonary vascularity is within normal limits. Clear lungs. No pneumothorax. No pleural effusion. IMPRESSION: Cardiomegaly without decompensation. Electronically Signed   By: Marybelle Killings M.D.   On: 06/21/2017 15:54    Cardiac Studies   Echocardiogram pending  Patient Profile     84 y.o. female with h/o CAD and single vessel CABG, bioprosthetic AVR,  HTN, recurrent CVA, GI bleeds, and paroxysmal atrial fibrillation admitted with chest pain.  Assessment & Plan    1. Chest pain in the context of CAD with 1-vessel CABG: No symptom recurrence today. Troponins normal x 3. Echocardiogram pending. Blood pressure significantly elevated yesterday, remains elevated today. This may have contributed to symptoms. I will add Imdur which may serve to decrease BP and provide anginal relief. I am also increasing benazepril to 40 mg. Continue ASA, Lipitor, and metoprolol. If echo shows normal LV systolic function and regional wall motion, could consider stopping ASA given that she is on Eliquis and has a prior h/o GI bleeds. I do not feel she requires stress testing at this time.  2. Paroxysmal atrial fibrillation: Remains in rate controlled atrial fibrillation. Continue long-acting diltiazem and metoprolol. Anticoagulated with  apixaban.  3. S/p bioprosthetic AVR: Stable by echo in 07/2016. Repeat echo pending.  4. Hypertension: BP is elevated. I will increase benazepril to 40 mg. This may have led to symptoms of chest pain.    For questions or updates, please contact [place] Please consult [email or web address] for contact info under Cardiology/STEMI.      Signed, Kate Sable, MD  06/22/2017, 10:30 AM

## 2017-06-22 NOTE — Progress Notes (Signed)
Family at bedside. Update provided per pt request

## 2017-06-23 DIAGNOSIS — I208 Other forms of angina pectoris: Secondary | ICD-10-CM | POA: Diagnosis not present

## 2017-06-23 MED ORDER — BENAZEPRIL HCL 40 MG PO TABS: 20.0000 mg | ORAL_TABLET | Freq: Every day | ORAL | 5 refills | Status: DC

## 2017-06-23 MED ORDER — ASPIRIN 81 MG PO TBEC: 81.0000 mg | DELAYED_RELEASE_TABLET | Freq: Every day | ORAL | Status: DC

## 2017-06-23 MED ORDER — ISOSORBIDE MONONITRATE ER 30 MG PO TB24: 30.0000 mg | ORAL_TABLET | Freq: Every day | ORAL | 5 refills | Status: DC

## 2017-06-23 NOTE — Progress Notes (Addendum)
Progress Note  Patient Name: Rebecca Skinner Date of Encounter: 06/23/2017  Primary Cardiologist: No primary care provider on file. Kelly  Subjective   Feels better, no CP, no SOB, daughter in room  Inpatient Medications    Scheduled Meds: . apixaban  5 mg Oral BID  . aspirin EC  81 mg Oral Daily  . atorvastatin  40 mg Oral QHS  . benazepril  40 mg Oral Daily  . budesonide  0.5 mg Nebulization Daily  . diltiazem  120 mg Oral Daily  . furosemide  40 mg Oral Daily  . isosorbide mononitrate  30 mg Oral Daily  . metoprolol tartrate  25 mg Oral BID  . pantoprazole  40 mg Oral Daily  . potassium chloride  10 mEq Oral QHS   Continuous Infusions:  PRN Meds: acetaminophen, ALPRAZolam, nitroGLYCERIN, ondansetron (ZOFRAN) IV   Vital Signs    Vitals:   06/22/17 2100 06/23/17 0233 06/23/17 0643 06/23/17 0845  BP: (!) 121/50  133/64   Pulse: 81  65 (!) 59  Resp: 18   16  Temp: 98 F (36.7 C)  97.8 F (36.6 C)   TempSrc: Oral  Oral   SpO2: 96%  98% 98%  Weight:  153 lb 14.4 oz (69.8 kg)    Height:        Intake/Output Summary (Last 24 hours) at 06/23/2017 1113 Last data filed at 06/23/2017 0833 Gross per 24 hour  Intake 700 ml  Output 650 ml  Net 50 ml   Filed Weights   06/21/17 2014 06/22/17 0421 06/23/17 0233  Weight: 163 lb 12.8 oz (74.3 kg) 163 lb 4.8 oz (74.1 kg) 153 lb 14.4 oz (69.8 kg)    Telemetry    AFIB rate cont 50-70 - Personally Reviewed  ECG   AFIB - Personally Reviewed  Physical Exam   GEN: No acute distress. Elderly  Neck: No JVD Cardiac: irreg, soft RUSB SM, no rubs, or gallops.  Respiratory: Clear to auscultation bilaterally. GI: Soft, nontender, non-distended  MS: No edema; No deformity. Neuro:  Nonfocal  Psych: Normal affect   Labs    Chemistry Recent Labs  Lab 06/21/17 1553  NA 139  K 4.1  CL 102  CO2 27  GLUCOSE 103*  BUN 15  CREATININE 1.08*  CALCIUM 9.1  GFRNONAA 43*  GFRAA 50*  ANIONGAP 10     Hematology Recent  Labs  Lab 06/21/17 1553  WBC 8.1  RBC 4.36  HGB 14.8  HCT 43.9  MCV 100.7*  MCH 33.9  MCHC 33.7  RDW 12.9  PLT 175    Cardiac Enzymes Recent Labs  Lab 06/21/17 2043 06/22/17 0026 06/22/17 0532  TROPONINI <0.03 <0.03 <0.03    Recent Labs  Lab 06/21/17 1537  TROPIPOC 0.01     BNPNo results for input(s): BNP, PROBNP in the last 168 hours.   DDimer No results for input(s): DDIMER in the last 168 hours.   Radiology    Dg Chest 2 View  Result Date: 06/21/2017 CLINICAL DATA:  Chest pain EXAM: CHEST  2 VIEW COMPARISON:  01/30/2017 FINDINGS: The heart is markedly enlarged. Postoperative changes from CABG and aortic valvular replacement. Pulmonary vascularity is within normal limits. Clear lungs. No pneumothorax. No pleural effusion. IMPRESSION: Cardiomegaly without decompensation. Electronically Signed   By: Marybelle Killings M.D.   On: 06/21/2017 15:54    Cardiac Studies   ECHO this admit - Left ventricle: The cavity size was normal. Wall thickness was  increased in a pattern of mild LVH. Indeterminant diastolic   function (atrial fibrillation). Systolic function was normal. The   estimated ejection fraction was in the range of 60% to 65%. Wall   motion was normal; there were no regional wall motion   abnormalities. - Ventricular septum: Mildly D-shaped interventricular septum   suggesting RV pressure/volume overload. - Aortic valve: Bioprosthetic aortic valve replacement. There was   no stenosis. There was trivial regurgitation. Mean gradient (S):   7 mm Hg. - Mitral valve: There was trivial regurgitation. - Left atrium: The atrium was mildly dilated. - Right ventricle: The cavity size was mildly dilated. Systolic   function was mildly reduced. - Tricuspid valve: There was moderate regurgitation. Peak RV-RA   gradient (S): 54 mm Hg. - Pulmonary arteries: PA peak pressure: 62 mm Hg (S). - Systemic veins: IVC measured 2.1 cm with < 50% respirophasic   variation,  suggesting RA pressure 8 mmHg.  Impressions:  - The patient was in atrial fibrillation. Normal LV size with mild   LV hypertrophy. EF 60-65%. Mildly dilated RV with mildly   decreased systolic function. D-shaped interventricular septum   suggestive of RV pressure/volume overload. Moderate pulmonary   hyeprtension. Bioprosthetic aortic valve appeared to function   normally.  Patient Profile     84 y.o. female with h/o CAD and single vessel CABG, bioprosthetic AVR, HTN, recurrent CVA, GI bleeds, and paroxysmal atrial fibrillation admitted with chest pain.    Assessment & Plan    CP, CAD post 1 vessel CABG  - ECHO normal EF  - Imdur added  - Lisinopril increased to 40  - Will continue low dose ASA with Eliquis on board noting prior GIB but has AVR.   - OK to DC, no stress test.   AVR  - bioprosthetic  - Eliquis ok with 5mg  BID dose.    Essential HTN  - meds increased yesterday. Improved  Permanent AFIB  - Eliquis. Rate controlled. Stable.   OK for DC. Daughter in room. Follow up in 2 weeks.    For questions or updates, please contact [place] Please consult [email or web address] for contact info under Cardiology/STEMI.      Signed, Candee Furbish, MD  06/23/2017, 11:13 AM

## 2017-06-23 NOTE — Consult Note (Signed)
   [facility] CM Inpatient Consult   06/23/2017  Rebecca Skinner 10-22-1922 [id number]  Patient screened for potential [facility] services. Patient is in the [facility] [place] [facility] services under patient's [employer]  plan.   Met with patient and [name] at bedside regarding post hospital needs. Endorses Dr. Kathlene November as Primary Care Provider at [facility]. This office is listed to provide post hospital transition follow up.  Patient and [name] states no needs at this time.  They did accept a brochure, 24 hour nurse line magnet and contact information.   For questions contact:   Natividad Brood, RN BSN [facility] Liaison  [phone] business mobile phone Toll free office [phone]

## 2017-06-23 NOTE — Discharge Summary (Signed)
Discharge Summary    Patient ID: Rebecca Skinner,  MRN: [id number], DOB/AGE: 1923/01/13 84 y.o.  Admit date: 06/21/2017 Discharge date: 06/23/2017  Primary Care Provider: [facility] Primary Cardiologist: Shelva Majestic, MD  Discharge Diagnoses    Active Problems:   Chest pain   Allergies Allergies  Allergen Reactions  . Hydrocodone Itching and Nausea Only  . Tramadol Hcl Itching and Nausea Only    Diagnostic Studies/Procedures    2D Echo 06/22/17 Study Conclusions  - Left ventricle: The cavity size was normal. Wall thickness was   increased in a pattern of mild LVH. Indeterminant diastolic   function (atrial fibrillation). Systolic function was normal. The   estimated ejection fraction was in the range of 60% to 65%. Wall   motion was normal; there were no regional wall motion   abnormalities. - Ventricular septum: Mildly D-shaped interventricular septum   suggesting RV pressure/volume overload. - Aortic valve: Bioprosthetic aortic valve replacement. There was   no stenosis. There was trivial regurgitation. Mean gradient (S):   7 mm Hg. - Mitral valve: There was trivial regurgitation. - Left atrium: The atrium was mildly dilated. - Right ventricle: The cavity size was mildly dilated. Systolic   function was mildly reduced. - Tricuspid valve: There was moderate regurgitation. Peak RV-RA   gradient (S): 54 mm Hg. - Pulmonary arteries: PA peak pressure: 62 mm Hg (S). - Systemic veins: IVC measured 2.1 cm with < 50% respirophasic   variation, suggesting RA pressure 8 mmHg.  Impressions:  - The patient was in atrial fibrillation. Normal LV size with mild   LV hypertrophy. EF 60-65%. Mildly dilated RV with mildly   decreased systolic function. D-shaped interventricular septum   suggestive of RV pressure/volume overload. Moderate pulmonary   hyeprtension. Bioprosthetic aortic valve appeared to function   normally.   History of Present Illness     84 y.o. female  with h/o CAD and single vessel CABG, bioprosthetic AVR, HTN, recurrent CVA, GI bleeds, and paroxysmal atrial fibrillation admitted with chest pain, in the setting of elevated BP.   ED Course: She presents after feeling central chest pain at rest starting yesterday coming and going at various points.  Radiated to right upper neck.  No N/V, sweats, or other associated symptoms.  Not A/W eating.  Nothing made better, other than NTG SL.  BP in ED was elevated at 176/67. EKG and initial troponin was negative. Given her history, she was admitted for observation and r/o.     Hospital Course     Pt was admitted to telemetry. Her symptoms improved as her BP improved. It was felt that perhaps her symptoms were related to elevated BP. Imdur was added to her regimen and her home benazepril was increased to 40 mg. CP resolved with normalization of BP. Cardiac enzymes were cycled and negative x 3. 2D echo was preformed and showed normal LVEF and wall motion. Her bioprosthetic AV was functioning normally. No indication for stress testing.  On 06/23/17, pt was seen by Dr. Marlou Porch and was CP free with controlled BP.  Dr. Marlou Porch felt that she was stable for discharge home. Post hospital d/c will be arranged in our office.   Consultants: none    Discharge Vitals Blood pressure 133/64, pulse (!) 59, temperature 97.8 F (36.6 C), temperature source Oral, resp. rate 16, height 5\' 2"  (1.575 m), weight 153 lb 14.4 oz (69.8 kg), SpO2 98 %.  Filed Weights   06/21/17 2014 06/22/17 0421  06/23/17 0233  Weight: 163 lb 12.8 oz (74.3 kg) 163 lb 4.8 oz (74.1 kg) 153 lb 14.4 oz (69.8 kg)    Labs & Radiologic Studies    CBC Recent Labs    06/21/17 1553  WBC 8.1  HGB 14.8  HCT 43.9  MCV 100.7*  PLT 403   Basic Metabolic Panel Recent Labs    06/21/17 1553  NA 139  K 4.1  CL 102  CO2 27  GLUCOSE 103*  BUN 15  CREATININE 1.08*  CALCIUM 9.1   Liver Function Tests No results for input(s): AST, ALT, ALKPHOS,  BILITOT, PROT, ALBUMIN in the last 72 hours. No results for input(s): LIPASE, AMYLASE in the last 72 hours. Cardiac Enzymes Recent Labs    06/21/17 2043 06/22/17 0026 06/22/17 0532  TROPONINI <0.03 <0.03 <0.03   BNP Invalid input(s): POCBNP D-Dimer No results for input(s): DDIMER in the last 72 hours. Hemoglobin A1C No results for input(s): HGBA1C in the last 72 hours. Fasting Lipid Panel No results for input(s): CHOL, HDL, LDLCALC, TRIG, CHOLHDL, LDLDIRECT in the last 72 hours. Thyroid Function Tests No results for input(s): TSH, T4TOTAL, T3FREE, THYROIDAB in the last 72 hours.  Invalid input(s): FREET3 _____________  Dg Chest 2 View  Result Date: 06/21/2017 CLINICAL DATA:  Chest pain EXAM: CHEST  2 VIEW COMPARISON:  01/30/2017 FINDINGS: The heart is markedly enlarged. Postoperative changes from CABG and aortic valvular replacement. Pulmonary vascularity is within normal limits. Clear lungs. No pneumothorax. No pleural effusion. IMPRESSION: Cardiomegaly without decompensation. Electronically Signed   By: Marybelle Killings M.D.   On: 06/21/2017 15:54   Disposition   Pt is being discharged home today in good condition.  Follow-up Plans & Appointments    Follow-up Information    Troy Sine, MD Follow up.   Specialty:  Cardiology Why:  our office will call you with a hospital follow-up visit with Dr. Claiborne Billings or with his PA/ NP in 2-3 weeks.  Contact information: [address] [place] [address] [phone]          Discharge Instructions    Diet - low sodium heart healthy   Complete by:  As directed    Increase activity slowly   Complete by:  As directed       Discharge Medications   Allergies as of 06/23/2017      Reactions   Hydrocodone Itching, Nausea Only   Tramadol Hcl Itching, Nausea Only      Medication List    STOP taking these medications   cefdinir 300 MG capsule Commonly known as:  OMNICEF   diclofenac sodium 1 %  Gel Commonly known as:  VOLTAREN   diltiazem 120 MG tablet Commonly known as:  CARDIZEM   ketoconazole 2 % cream Commonly known as:  NIZORAL   levalbuterol 0.63 MG/3ML nebulizer solution Commonly known as:  XOPENEX   lidocaine 5 % Commonly known as:  LIDODERM     TAKE these medications   acetaminophen 500 MG tablet Commonly known as:  TYLENOL Take 1,000 mg by mouth every 6 (six) hours as needed for moderate pain (pain).   ALPRAZolam 0.25 MG tablet Commonly known as:  XANAX 1/2 or 1 tablet by mouth at night as needed for insomnia   aspirin 81 MG EC tablet Take 1 tablet (81 mg total) by mouth daily. Start taking on:  06/24/2017   atorvastatin 40 MG tablet Commonly known as:  LIPITOR Take 1 tablet (40 mg total) by mouth  at bedtime.   benazepril 40 MG tablet Commonly known as:  LOTENSIN Take 0.5 tablets (20 mg total) by mouth daily. What changed:  medication strength   budesonide 0.5 MG/2ML nebulizer solution Commonly known as:  PULMICORT USE 1 VIAL VIA NEBULIZER TWICE DAILY What changed:    how much to take  how to take this  when to take this  additional instructions   CALCIUM 500 + D 500-125 MG-UNIT Tabs Generic drug:  Calcium Carbonate-Vitamin D Take 1 tablet by mouth at bedtime.   cholecalciferol 1000 units tablet Commonly known as:  VITAMIN D Take 1,000 Units by mouth at bedtime.   diltiazem 120 MG 24 hr capsule Commonly known as:  CARDIZEM CD Take 120 mg by mouth daily.   ELIQUIS 5 MG Tabs tablet Generic drug:  apixaban TAKE 1 TABLET BY MOUTH TWICE A DAY What changed:    how much to take  how to take this  when to take this   esomeprazole 40 MG capsule Commonly known as:  NEXIUM Take 1 capsule (40 mg total) by mouth daily before breakfast. What changed:    when to take this  reasons to take this   furosemide 20 MG tablet Commonly known as:  LASIX Take 2 tablets (40 mg total) by mouth daily.   isosorbide mononitrate 30 MG 24 hr  tablet Commonly known as:  IMDUR Take 1 tablet (30 mg total) by mouth daily. Start taking on:  06/24/2017   Magnesium Oxide 400 (240 Mg) MG Tabs Take 1 tablet (400 mg total) by mouth daily. What changed:  when to take this   methimazole 5 MG tablet Commonly known as:  TAPAZOLE Take 1 tablet (5 mg total) by mouth 3 (three) times a week.   metoprolol tartrate 25 MG tablet Commonly known as:  LOPRESSOR Take 1 tablet (25 mg total) by mouth 2 (two) times daily.   nitroGLYCERIN 0.4 MG SL tablet Commonly known as:  NITROSTAT Place 1 tablet (0.4 mg total) under the tongue every 5 (five) minutes x 3 doses as needed for chest pain.   potassium chloride 10 MEQ tablet Commonly known as:  KLOR-CON M10 Take 1 tablet (10 mEq total) by mouth daily. What changed:  when to take this       Outstanding Labs/Studies   None   Duration of Discharge Encounter   Greater than 30 minutes including physician time.  Signed, Lyda Jester PA-C 06/23/2017, 11:54 AM  Personally seen and examined. Agree with above. Feels better, no CP, no SOB, daughter in room   GEN:No acute distress. Elderly  Neck:No JVD Cardiac:irreg, soft RUSB SM, no rubs, or gallops.  Respiratory:Clear to auscultation bilaterally. PP:JKDT, nontender, non-distended  MS:No edema; No deformity. Neuro:Nonfocal  Psych: Normal affect    CP, CAD post 1 vessel CABG  - ECHO normal EF  - Imdur added  - Lisinopril increased to 40  - Will continue low dose ASA with Eliquis on board noting prior GIB but has AVR.   - OK to DC, no stress test.   AVR  - bioprosthetic  - Eliquis ok with 5mg  BID dose.    Essential HTN  - meds increased yesterday. Improved  Permanent AFIB  - Eliquis. Rate controlled. Stable.   OK for DC. Daughter in room. Follow up in 2 weeks.    For questions or updates, please contact [place] Please consult [email or web address] for contact info under Cardiology/STEMI.      Signed, Candee Furbish, MD

## 2017-06-24 ENCOUNTER — Telehealth: Payer: Self-pay

## 2017-06-24 NOTE — Telephone Encounter (Signed)
06/24/17  TCM Hospital Follow Up  Transition Care Management Follow-up Telephone Call  ADMISSION DATE: 06/21/2017  DISCHARGE DATE: 06/23/2017   How have you been since you were released from the hospital? Doing pretty good per daughter.   Do you understand why you were in the hospital? Yes per daughter   Do you understand the discharge instrcutions? Yes per daughter    Items Reviewed:  Medications reviewed: Yes   Allergies reviewed:Yes   Dietary changes reviewed: Low Sodium Heart Healthy  Referrals reviewed:Appointment scheduled for Hospital follow Up  Functional Questionnaire:   Activities of Daily Living (ADLs): Assisted with ADLs  Any patient concerns? Patient having nose bleeds after blowing nose. Has knot on buttock that they are concerned about.   Confirmed importance and date/time of follow-up visits scheduled: Yes   Confirmed with patient if condition begins to worsen call PCP or go to the ER. Yes    Patient was given the office number and encouragred to call back with questions or concerns. Yes

## 2017-06-24 NOTE — Telephone Encounter (Signed)
thx

## 2017-06-25 ENCOUNTER — Ambulatory Visit (INDEPENDENT_AMBULATORY_CARE_PROVIDER_SITE_OTHER): Payer: Medicare Other | Admitting: Internal Medicine

## 2017-06-25 VITALS — BP 138/77 | HR 79 | Temp 97.5°F | Resp 16 | Ht 61.0 in | Wt 165.0 lb

## 2017-06-25 DIAGNOSIS — I10 Essential (primary) hypertension: Secondary | ICD-10-CM

## 2017-06-25 DIAGNOSIS — R197 Diarrhea, unspecified: Secondary | ICD-10-CM | POA: Diagnosis not present

## 2017-06-25 DIAGNOSIS — R0789 Other chest pain: Secondary | ICD-10-CM

## 2017-06-25 DIAGNOSIS — I481 Persistent atrial fibrillation: Secondary | ICD-10-CM

## 2017-06-25 DIAGNOSIS — I4819 Other persistent atrial fibrillation: Secondary | ICD-10-CM

## 2017-06-25 DIAGNOSIS — E039 Hypothyroidism, unspecified: Secondary | ICD-10-CM | POA: Diagnosis not present

## 2017-06-25 DIAGNOSIS — I2510 Atherosclerotic heart disease of native coronary artery without angina pectoris: Secondary | ICD-10-CM

## 2017-06-25 MED ORDER — ESOMEPRAZOLE MAGNESIUM 40 MG PO CPDR: 40.0000 mg | DELAYED_RELEASE_CAPSULE | Freq: Every day | ORAL | 1 refills | Status: DC | PRN

## 2017-06-25 NOTE — Patient Instructions (Signed)
Next visit in 3 months  Take the medications as prescribed including benazepril 40 mg daily (either 2 tablets of 20 mg or 1 tablet of 40 mg)  Check the  blood pressure 2 or 3 times a   week, daily if possible Be sure your blood pressure is between 110/65 and  145/85.  if it is consistently higher or lower, let me know   If you   havemfrequent or severe nosebleeds hold aspirin and call me or your heart doctor.

## 2017-06-25 NOTE — Progress Notes (Signed)
Subjective:    Patient ID: Rebecca Skinner, female    DOB: 1923/03/27, 84 y.o.   MRN: [id number]  DOS:  06/25/2017 Type of visit - description : TCM 7 Interval history: Admitted to the hospital and discharge 06/23/2017: Presented to the ER with chest pain, BP was noted to be elevated, saw cardiology, workup was done. Felt that her chest pain was probably due to the elevated BP. Imdur and aspirin were added and benazepril dose increased to 40 mg. Negative cardiac enzyme, echo showed a normal function of bioprosthetic valve.  Review of Systems  Since she left the hospital she is feeling well.  Denies chest pain, no ambulatory BPs. She did notice some nosebleed today but denies blood in the stools or in the urine. Occasional GERD symptoms, request a refill on Nexium.  Past Medical History:  Diagnosis Date  . Anxiety 11/20/2011  . Asthma    UNDER THE [facility]ARE [facility] DR Kori Colin  . CAD (coronary artery disease)    s/p CABG s/p AO valve replacement ([place] [facility])  . CVA (cerebral infarction) 08-2013   multiple, L, d/t Afib, started eloquis  . Diabetes mellitus    type 2   . Dizziness    Chronic, admiet 07-2010,saw neuro, thought to be a peripheral issue   . Dry skin   . GERD (gastroesophageal reflux disease)   . Hyperlipidemia   . Hypertension   . Hyperthyroidism   . Keloid    @ chest  . Memory loss   . Osteoarthritis   . Osteopenia    per dexa 12/09  . Paroxysmal atrial fibrillation ([employer])    cards d/c coumadin 12/2008 d/t persisten NSR and frequent falls- restarted coumadin july 2011, now on Eliquis  . Recurrent UTI   . Shortness of breath dyspnea   . TIA (transient ischemic attack)     Past Surgical History:  Procedure Laterality Date  . ABDOMINAL HYSTERECTOMY    . AORTIC VALVE REPLACEMENT  1998  . CARDIAC VALVE REPLACEMENT    . CAROTID DOPPLER  07/13/12   BILATERAL BULB/PROXIMAL ICAS;MILD AMOUNT OF FIBROUS PLAQUE WITH NO DIAMETER REDUCTION.  . CESAREAN SECTION     x 2  .  COLONOSCOPY WITH PROPOFOL N/A 03/30/2014   Procedure: COLONOSCOPY WITH PROPOFOL;  Surgeon: Irene Shipper, MD;  Location: [facility];  Service: Endoscopy;  Laterality: N/A;  . CORONARY ARTERY BYPASS GRAFT  1998   SVG TO RCA  . HEMORRHOID SURGERY    . JOINT REPLACEMENT    . MYOCARDIAL PERFUSION STUDY  12/19/09   NORMAL PATTERN OF PERFUSION IN ALL REGIONS.EF 75%.  . OOPHORECTOMY    . TOTAL KNEE ARTHROPLASTY  1999  . TRANSTHORACIC ECHOCARDIOGRAM  09/11/11   LVEF >55%.STAGE 1 DIASTOLIC DYSFUNCTION,ELEVATED LV FILLING PRESSURE.BIOPROSTHETIC AORTIC VALVE-PEAK AND MEAN GRADIENTS OF 17 MMHG AND 8 MMHG.SIGMOID SEPTUM.MILD TO MOD MR.MILD TO MOD TR.RVSP 46 MMHG.    Social History   Socioeconomic History  . Marital status: Divorced    Spouse name: Not on file  . Number of children: 4  . Years of education: Not on file  . Highest education level: Not on file  Social Needs  . Financial resource strain: Not on file  . Food insecurity - worry: Not on file  . Food insecurity - inability: Not on file  . Transportation needs - medical: Not on file  . Transportation needs - non-medical: Not on file  Occupational History  . Occupation: retired     Fish farm manager:  RETIRED  Tobacco Use  . Smoking status: Former Research scientist (life sciences)  . Smokeless tobacco: Never Used  . Tobacco comment: quit 2004, smoked 1.5 ppd  Substance and Sexual Activity  . Alcohol use: No    Alcohol/week: 0.0 oz  . Drug use: No  . Sexual activity: Not on file  Other Topics Concern  . Not on file  Social History Narrative   Had to moved w/ Rebecca Skinner  ~ 01-2016   Had 3 daughter- 2 son  (lost oldest daughter and son), 2 living daughters in [place] as of 06/25/2017      Reactions   Hydrocodone Itching, Nausea Only   Tramadol Hcl Itching, Nausea Only      Medication List        Accurate as of 06/25/17 11:59 PM. Always use your most recent med list.          acetaminophen 500 MG tablet Commonly known as:  TYLENOL Take 1,000  mg by mouth every 6 (six) hours as needed for moderate pain (pain).   ALPRAZolam 0.25 MG tablet Commonly known as:  XANAX 1/2 or 1 tablet by mouth at night as needed for insomnia   aspirin 81 MG EC tablet Take 1 tablet (81 mg total) by mouth daily.   atorvastatin 40 MG tablet Commonly known as:  LIPITOR Take 1 tablet (40 mg total) by mouth at bedtime.   benazepril 40 MG tablet Commonly known as:  LOTENSIN Take 40 mg by mouth daily.   budesonide 0.5 MG/2ML nebulizer solution Commonly known as:  PULMICORT USE 1 VIAL VIA NEBULIZER TWICE DAILY   CALCIUM 500 + D 500-125 MG-UNIT Tabs Generic drug:  Calcium Carbonate-Vitamin D Take 1 tablet by mouth at bedtime.   cholecalciferol 1000 units tablet Commonly known as:  VITAMIN D Take 1,000 Units by mouth at bedtime.   diltiazem 120 MG 24 hr capsule Commonly known as:  CARDIZEM CD Take 120 mg by mouth daily.   ELIQUIS 5 MG Tabs tablet Generic drug:  apixaban TAKE 1 TABLET BY MOUTH TWICE A DAY   esomeprazole 40 MG capsule Commonly known as:  NEXIUM Take 1 capsule (40 mg total) by mouth daily as needed.   furosemide 20 MG tablet Commonly known as:  LASIX Take 2 tablets (40 mg total) by mouth daily.   isosorbide mononitrate 30 MG 24 hr tablet Commonly known as:  IMDUR Take 1 tablet (30 mg total) by mouth daily.   Magnesium Oxide 400 (240 Mg) MG Tabs Take 1 tablet (400 mg total) by mouth daily.   methimazole 5 MG tablet Commonly known as:  TAPAZOLE Take 1 tablet (5 mg total) by mouth 3 (three) times a week.   metoprolol tartrate 25 MG tablet Commonly known as:  LOPRESSOR Take 1 tablet (25 mg total) by mouth 2 (two) times daily.   nitroGLYCERIN 0.4 MG SL tablet Commonly known as:  NITROSTAT Place 1 tablet (0.4 mg total) under the tongue every 5 (five) minutes x 3 doses as needed for chest pain.   potassium chloride 10 MEQ tablet Commonly known as:  KLOR-CON M10 Take 1 tablet (10 mEq total) by mouth daily.           Objective:   Physical Exam BP 138/77   Pulse 79   Temp (!) 97.5 F (36.4 C) (Oral)   Resp 16   Ht 5\' 1"  (1.549 m)   Wt 165 lb (74.8 kg)   SpO2 100%   BMI  31.18 kg/m  General:   Well developed, well nourished . NAD.  HEENT:  Normocephalic . Face symmetric, atraumatic Lungs:  CTA B Normal respiratory effort, no intercostal retractions, no accessory muscle use. Heart: irreg,  no murmur.  No pretibial edema bilaterally  Skin: Not pale. Not jaundice Neurologic:  alert & oriented X3.  Speech normal, gait appropriate for age and unassisted Psych--  Cognition and judgment appear intact.  Cooperative with normal attention span and concentration.  Behavior appropriate. No anxious or depressed appearing.      Assessment & Plan:   Assessment DM HTN Hyperlipidemia Hyperthyroidism -- used to see Dr. Loanne Drilling, last visit 01-2016, now f/u PCP, check labs x 2 q year GERD Asthma  Anxiety on xanax  CV: Dr. Claiborne Billings --CHF, CAD, CABG, Aortic valve replacement --P- atrial fibrillation, used to be on Coumadin, Eliquis rx after the stroke 3-15, d/c 03-2014 d/t GI bleed , on ASA 81. Dx TIA 08-2016, back on eliquis, asa d/c NEURO --TIAs (admited 08-2016), CVA 2015 --Dementia MMSE ~ 12 to 15  (12-2016) DJD Osteopenia 2009 Recurrent UTIs Daughter  Rebecca Skinner; lives w/ Rebecca Skinner  PLAN CP: Recently admitted to the hospital, had chest pain, BP elevated.  She rule out for acute MI, echo was a stable. BP was noted to be elevated and possibly the culprit for her sxs. Medications were changed:  added Imdur and aspirin, increase benazepril to 40 mg.  The  prescription  sent to the pharmacy stated benazepril 40 mg "half tablet daily". That was corrected today, rec  to take meds per the list provided today. It is  too early to check BMP or CBC however she has a follow-up with cardiology in few days.  Will send cards a message. HTN: As above CAD: As above, chest pain resolved, aspirin was added , c/o nose  bleed to day, rec to call if sx severe/persistent, see AVS  Atrial fibrillation: Anticoagulated, rate controlled. RTC 3 months     "Hot flashes" : sx as described above, not clear if they represent fever/chills, "B symptoms", thyroid or meds related sx, infection or others. Will start by doing a chest x-ray, UA, urine culture, CBC, peripheral blood smears. Hypothyroidism: Check TFTs, on Tapazole Diarrhea: As described above, will check a stool culture, WBC and C. difficile. Sxs started shortly after Aricept was rx thus will d/c Aricept for now. RTC 3 weeks

## 2017-06-25 NOTE — Progress Notes (Signed)
Pre visit review using our clinic review tool, if applicable. No additional management support is needed unless otherwise documented below in the visit note. 

## 2017-06-26 NOTE — Assessment & Plan Note (Signed)
CP: Recently admitted to the hospital, had chest pain, BP elevated.  She rule out for acute MI, echo was a stable. BP was noted to be elevated and possibly the culprit for her sxs. Medications were changed:  added Imdur and aspirin, increase benazepril to 40 mg.  The  prescription  sent to the pharmacy stated benazepril 40 mg "half tablet daily". That was corrected today, rec  to take meds per the list provided today. It is  too early to check BMP or CBC however she has a follow-up with cardiology in few days.  Will send cards a message. HTN: As above CAD: As above, chest pain resolved, aspirin was added , c/o nose bleed to day, rec to call if sx severe/persistent, see AVS  Atrial fibrillation: Anticoagulated, rate controlled. RTC 3 months

## 2017-07-01 ENCOUNTER — Encounter: Payer: Self-pay | Admitting: Adult Health

## 2017-07-07 ENCOUNTER — Ambulatory Visit: Payer: Medicare Other | Admitting: Adult Health

## 2017-07-07 ENCOUNTER — Encounter: Payer: Self-pay | Admitting: Adult Health

## 2017-07-07 VITALS — BP 130/80 | HR 79 | Ht 62.0 in | Wt 165.4 lb

## 2017-07-07 DIAGNOSIS — I482 Chronic atrial fibrillation: Secondary | ICD-10-CM | POA: Diagnosis not present

## 2017-07-07 DIAGNOSIS — I10 Essential (primary) hypertension: Secondary | ICD-10-CM

## 2017-07-07 DIAGNOSIS — I2510 Atherosclerotic heart disease of native coronary artery without angina pectoris: Secondary | ICD-10-CM

## 2017-07-07 DIAGNOSIS — I4821 Permanent atrial fibrillation: Secondary | ICD-10-CM

## 2017-07-07 NOTE — Patient Instructions (Signed)
Medication Instructions:  NO CHANGES-Your physician recommends that you continue on your current medications as directed. Please refer to the Current Medication list given to you today.  If you need a refill on your cardiac medications before your next appointment, please call your pharmacy.  Follow-Up: Your physician wants you to follow-up in: [place] should receive a reminder letter in the mail two months in advance. If you do not receive a letter, please call our office MAY 2019 to schedule the July 2019 follow-up appointment.   Thank you for choosing [facility] at [facility]!!

## 2017-07-07 NOTE — Progress Notes (Signed)
Cardiology Office Note   Date:  07/07/2017   ID:  Rebecca Skinner, DOB 08-27-1922, MRN [id number]  PCP:  Colon Branch, MD  Cardiologist: Dr. Claiborne Billings Chief Complaint  Patient presents with  . Atrial Fibrillation  . Hypertension     History of Present Illness: Rebecca Skinner is a 84 y.o. female who presents for posthospitalization follow-up after admission for chest pain , atrial fib and hypertension.  The patient was continued on home dose of benazepril, but increased in dose to 40 mg daily, isosorbide was added.  Echocardiogram revealed normal LV systolic function, bioprosthetic aortic valve was functioning normally, ruled out for ACS, and she was not found to require cardiac stress testing.  She is here today without any recurrent complaints of chest pain, dyspnea on exertion, or weakness.  She is tolerating the medications prescribed from hospital discharge very well without side effects of dizziness, headache, or nausea.   Past Medical History:  Diagnosis Date  . Anxiety 11/20/2011  . Asthma    [facility] DR PAZ  . CAD (coronary artery disease)    s/p CABG s/p AO valve replacement ([place] [facility])  . CVA (cerebral infarction) 08-2013   multiple, L, d/t Afib, started eloquis  . Diabetes mellitus    type 2   . Dizziness    Chronic, admiet 07-2010,saw neuro, thought to be a peripheral issue   . Dry skin   . GERD (gastroesophageal reflux disease)   . Hyperlipidemia   . Hypertension   . Hyperthyroidism   . Keloid    @ chest  . Memory loss   . Osteoarthritis   . Osteopenia    per dexa 12/09  . Paroxysmal atrial fibrillation ([place])    cards d/c coumadin 12/2008 d/t persisten NSR and frequent falls- restarted coumadin july 2011, now on Eliquis  . Recurrent UTI   . Shortness of breath dyspnea   . TIA (transient ischemic attack)     Past Surgical History:  Procedure Laterality Date  . ABDOMINAL HYSTERECTOMY    . AORTIC VALVE REPLACEMENT  1998  . CARDIAC VALVE REPLACEMENT    .  CAROTID DOPPLER  07/13/12   BILATERAL BULB/PROXIMAL ICAS;MILD AMOUNT OF FIBROUS PLAQUE WITH NO DIAMETER REDUCTION.  . CESAREAN SECTION     x 2  . COLONOSCOPY WITH PROPOFOL N/A 03/30/2014   Procedure: COLONOSCOPY WITH PROPOFOL;  Surgeon: Irene Shipper, MD;  Location: [facility];  Service: Endoscopy;  Laterality: N/A;  . CORONARY ARTERY BYPASS GRAFT  1998   SVG TO RCA  . HEMORRHOID SURGERY    . JOINT REPLACEMENT    . MYOCARDIAL PERFUSION STUDY  12/19/09   NORMAL PATTERN OF PERFUSION IN ALL REGIONS.EF 75%.  . OOPHORECTOMY    . TOTAL KNEE ARTHROPLASTY  1999  . TRANSTHORACIC ECHOCARDIOGRAM  09/11/11   LVEF >55%.STAGE 1 DIASTOLIC DYSFUNCTION,ELEVATED LV FILLING PRESSURE.BIOPROSTHETIC AORTIC VALVE-PEAK AND MEAN GRADIENTS OF 17 MMHG AND 8 MMHG.SIGMOID SEPTUM.MILD TO MOD MR.MILD TO MOD TR.RVSP 46 MMHG.     Current Outpatient Medications  Medication Sig Dispense Refill  . acetaminophen (TYLENOL) 500 MG tablet Take 1,000 mg by mouth every 6 (six) hours as needed for moderate pain (pain).    Marland Kitchen ALPRAZolam (XANAX) 0.25 MG tablet 1/2 or 1 tablet by mouth at night as needed for insomnia 30 tablet 0  . aspirin EC 81 MG EC tablet Take 1 tablet (81 mg total) by mouth daily.    Marland Kitchen atorvastatin (LIPITOR) 40 MG tablet  Take 1 tablet (40 mg total) by mouth at bedtime. 90 tablet 0  . benazepril (LOTENSIN) 40 MG tablet Take 40 mg by mouth daily.    . budesonide (PULMICORT) 0.5 MG/2ML nebulizer solution USE 1 VIAL VIA NEBULIZER TWICE DAILY (Patient taking differently: Take 0.5 mg by nebulization daily. USE 1 VIAL VIA NEBULIZER TWICE DAILY) 120 mL 5  . Calcium Carbonate-Vitamin D (CALCIUM 500 + D) 500-125 MG-UNIT TABS Take 1 tablet by mouth at bedtime.    . cholecalciferol (VITAMIN D) 1000 UNITS tablet Take 1,000 Units by mouth at bedtime.     Marland Kitchen ELIQUIS 5 MG TABS tablet TAKE 1 TABLET BY MOUTH TWICE A DAY (Patient taking differently: TAKE 5mg  TABLET BY MOUTH TWICE A DAY) 60 tablet 3  . esomeprazole (NEXIUM) 40 MG  capsule Take 1 capsule (40 mg total) by mouth daily as needed. 90 capsule 1  . furosemide (LASIX) 20 MG tablet Take 2 tablets (40 mg total) by mouth daily. 180 tablet 1  . isosorbide mononitrate (IMDUR) 30 MG 24 hr tablet Take 1 tablet (30 mg total) by mouth daily. 30 tablet 5  . Magnesium Oxide 400 (240 Mg) MG TABS Take 1 tablet (400 mg total) by mouth daily. (Patient taking differently: Take 1 tablet by mouth at bedtime. ) 90 tablet 1  . methimazole (TAPAZOLE) 5 MG tablet Take 1 tablet (5 mg total) by mouth 3 (three) times a week. 40 tablet 3  . metoprolol tartrate (LOPRESSOR) 25 MG tablet Take 1 tablet (25 mg total) by mouth 2 (two) times daily. 180 tablet 1  . nitroGLYCERIN (NITROSTAT) 0.4 MG SL tablet Place 1 tablet (0.4 mg total) under the tongue every 5 (five) minutes x 3 doses as needed for chest pain. 25 tablet 3  . potassium chloride (KLOR-CON M10) 10 MEQ tablet Take 1 tablet (10 mEq total) by mouth daily. (Patient taking differently: Take 10 mEq by mouth at bedtime. ) 90 tablet 1   No current facility-administered medications for this visit.     Allergies:   Hydrocodone and Tramadol hcl    Social History:  The patient  reports that she has quit smoking. she has never used smokeless tobacco. She reports that she does not drink alcohol or use drugs.   Family History:  The patient's family history includes Coronary artery disease (age of onset: 66) in her son; Heart attack in her father; Hypertension in her brother; Stroke in her brother.    ROS: All other systems are reviewed and negative. Unless otherwise mentioned in H&P    PHYSICAL EXAM: VS:  BP 130/80   Pulse 79   Ht 5\' 2"  (1.575 m)   Wt 165 lb 6.4 oz (75 kg)   SpO2 98%   BMI 30.25 kg/m  , BMI Body mass index is 30.25 kg/m. GEN: Well nourished, well developed, in no acute distress  HEENT: normal  Neck: no JVD, carotid bruits, or masses Cardiac: RRR; 1/6 systolic murmur with crisp click, rubs, or gallops,no edema    Respiratory:  clear to auscultation bilaterally, normal work of breathing GI: soft, nontender, nondistended, + BS MS: no deformity or atrophy  Skin: warm and dry, no rash Neuro:  Strength and sensation are intact he is very hard of hearing. Psych: euthymic mood, full affect  Recent Labs: 08/30/2016: ALT 26; Magnesium 2.0 10/23/2016: B Natriuretic Peptide 359.4 01/30/2017: TSH 0.66 06/21/2017: BUN 15; Creatinine, Ser 1.08; Hemoglobin 14.8; Platelets 175; Potassium 4.1; Sodium 139    Lipid Panel  Component Value Date/Time   CHOL 104 08/31/2016 0700   TRIG 63 08/31/2016 0700   HDL 35 (L) 08/31/2016 0700   CHOLHDL 3.0 08/31/2016 0700   VLDL 13 08/31/2016 0700   LDLCALC 56 08/31/2016 0700      Wt Readings from Last 3 Encounters:  07/07/17 165 lb 6.4 oz (75 kg)  06/25/17 165 lb (74.8 kg)  06/23/17 153 lb 14.4 oz (69.8 kg)      Other studies Reviewed: Left ventricle: The cavity size was normal. Wall thickness was   increased in a pattern of mild LVH. Indeterminant diastolic   function (atrial fibrillation). Systolic function was normal. The   estimated ejection fraction was in the range of 60% to 65%. Wall   motion was normal; there were no regional wall motion   abnormalities. - Ventricular septum: Mildly D-shaped interventricular septum   suggesting RV pressure/volume overload. - Aortic valve: Bioprosthetic aortic valve replacement. There was   no stenosis. There was trivial regurgitation. Mean gradient (S):   7 mm Hg. - Mitral valve: There was trivial regurgitation. - Left atrium: The atrium was mildly dilated. - Right ventricle: The cavity size was mildly dilated. Systolic   function was mildly reduced. - Tricuspid valve: There was moderate regurgitation. Peak RV-RA   gradient (S): 54 mm Hg. - Pulmonary arteries: PA peak pressure: 62 mm Hg (S). - Systemic veins: IVC measured 2.1 cm with < 50% respirophasic   variation, suggesting RA pressure 8  mmHg.  Impressions:  - The patient was in atrial fibrillation. Normal LV size with mild   LV hypertrophy. EF 60-65%. Mildly dilated RV with mildly   decreased systolic function. D-shaped interventricular septum   suggestive of RV pressure/volume overload. Moderate pulmonary   hyeprtension. Bioprosthetic aortic valve appeared to function   normally.  ASSESSMENT AND PLAN:  1.  Coronary artery disease: She denies any recurrent complaints of shortness of breath, chest pain, or fatigue.  She is tolerating her medications without side effects as described above.  We will not make any changes in her regimen at this time.  She will follow-up in 6 months unless symptomatic  2.  Atrial fibrillation: Heart rate is well controlled, she remains on Eliquis milligrams twice daily without complaints of melena, hemoptysis, or excessive bruising.  The patient does have mild bleeding when she blows her nose only, but no frank epistaxis  3.  Hypertension: Well controlled currently.  No changes in regimen at this time.   Current medicines are reviewed at length with the patient today.    Labs/ tests ordered today include: None  Phill Myron. West Pugh, ANP, AACC   07/07/2017 1:29 PM    [facility] [address] [place], [place] [address] Phone: [phone]; Fax: [phone]

## 2017-07-16 ENCOUNTER — Ambulatory Visit (INDEPENDENT_AMBULATORY_CARE_PROVIDER_SITE_OTHER): Payer: Medicare Other | Admitting: Internal Medicine

## 2017-07-16 ENCOUNTER — Encounter: Payer: Self-pay | Admitting: Internal Medicine

## 2017-07-16 ENCOUNTER — Other Ambulatory Visit: Payer: Self-pay | Admitting: Internal Medicine

## 2017-07-16 VITALS — BP 124/70 | HR 83 | Temp 98.7°F | Resp 14 | Ht 62.0 in | Wt 164.1 lb

## 2017-07-16 DIAGNOSIS — E1159 Type 2 diabetes mellitus with other circulatory complications: Secondary | ICD-10-CM | POA: Diagnosis not present

## 2017-07-16 DIAGNOSIS — J45901 Unspecified asthma with (acute) exacerbation: Secondary | ICD-10-CM

## 2017-07-16 DIAGNOSIS — E0590 Thyrotoxicosis, unspecified without thyrotoxic crisis or storm: Secondary | ICD-10-CM | POA: Diagnosis not present

## 2017-07-16 DIAGNOSIS — I10 Essential (primary) hypertension: Secondary | ICD-10-CM | POA: Diagnosis not present

## 2017-07-16 LAB — CBC WITH DIFFERENTIAL/PLATELET
BASOS PCT: 0.6 % (ref 0.0–3.0)
Basophils Absolute: 0 10*3/uL (ref 0.0–0.1)
EOS PCT: 2.8 % (ref 0.0–5.0)
Eosinophils Absolute: 0.1 10*3/uL (ref 0.0–0.7)
HCT: 40.6 % (ref 36.0–46.0)
Hemoglobin: 13.3 g/dL (ref 12.0–15.0)
Lymphocytes Relative: 23.1 % (ref 12.0–46.0)
Lymphs Abs: 1.2 10*3/uL (ref 0.7–4.0)
MCHC: 32.7 g/dL (ref 30.0–36.0)
MCV: 100.3 fl — AB (ref 78.0–100.0)
MONO ABS: 1.1 10*3/uL — AB (ref 0.1–1.0)
MONOS PCT: 20.7 % — AB (ref 3.0–12.0)
Neutro Abs: 2.8 10*3/uL (ref 1.4–7.7)
Neutrophils Relative %: 52.8 % (ref 43.0–77.0)
Platelets: 166 10*3/uL (ref 150.0–400.0)
RBC: 4.05 Mil/uL (ref 3.87–5.11)
RDW: 13.7 % (ref 11.5–15.5)
WBC: 5.2 10*3/uL (ref 4.0–10.5)

## 2017-07-16 LAB — BASIC METABOLIC PANEL
BUN: 15 mg/dL (ref 6–23)
CHLORIDE: 100 meq/L (ref 96–112)
CO2: 32 mEq/L (ref 19–32)
Calcium: 9 mg/dL (ref 8.4–10.5)
Creatinine, Ser: 0.98 mg/dL (ref 0.40–1.20)
GFR: 68.19 mL/min (ref >60.00)
GLUCOSE: 98 mg/dL (ref 70–99)
POTASSIUM: 4.5 meq/L (ref 3.5–5.1)
SODIUM: 141 meq/L (ref 135–145)

## 2017-07-16 LAB — TSH: TSH: 2.14 u[IU]/mL (ref 0.35–4.50)

## 2017-07-16 LAB — T4, FREE: FREE T4: 0.76 ng/dL (ref 0.60–1.60)

## 2017-07-16 LAB — T3, FREE: T3, Free: 3.2 pg/mL (ref 2.3–4.2)

## 2017-07-16 LAB — HEMOGLOBIN A1C: HEMOGLOBIN A1C: 6.5 % (ref 4.6–6.5)

## 2017-07-16 MED ORDER — AZITHROMYCIN 250 MG PO TABS: ORAL_TABLET | ORAL | 0 refills | Status: DC

## 2017-07-16 MED ORDER — PREDNISONE 10 MG PO TABS: ORAL_TABLET | ORAL | 0 refills | Status: DC

## 2017-07-16 NOTE — Assessment & Plan Note (Signed)
Acute asthma exacerbation: Patient is coughing and wheezing, nontoxic appearing, VSS.  Has a nebulizer at home. Plan: Prednisone, Zithromax, continue nebs, call if not improving. Here for an acute problem but will also address chronic issues: HTN: Recently benazepril dose increased to 40, she also takes Lasix, Imdur, metoprolol and potassium.  Reports very good ambulatory BPs.  BP here today is very good.  Check a BMP and CBC DM: Diet controlled, check a A1c Hyperthyroidism: Currently on Tapazole, check TFTs RTC April 2019 already scheduled

## 2017-07-16 NOTE — Progress Notes (Signed)
Subjective:    Patient ID: Rebecca Skinner, female    DOB: 05/16/1923, 84 y.o.   MRN: [id number]  DOS:  07/16/2017 Type of visit - description : acute, here with her daughter Interval history: Symptoms of started 07/14/2017: Cough, whitish sputum, chest congestion, increased wheezing from baseline. They are using albuterol nebulizations mostly at night. Also, good compliance with medication, ambulatory BPs within normal. Dementia: At baseline, has good days and bad days.  Review of Systems No fever chills No sinus pain but she has some clear nasal discharge. No GERD symptoms No myalgias No hemoptysis.  Past Medical History:  Diagnosis Date  . Anxiety 11/20/2011  . Asthma    [facility] DR Aristeo Hankerson  . CAD (coronary artery disease)    s/p CABG s/p AO valve replacement ([place] [facility])  . CVA (cerebral infarction) 08-2013   multiple, L, d/t Afib, started eloquis  . Diabetes mellitus    type 2   . Dizziness    Chronic, admiet 07-2010,saw neuro, thought to be a peripheral issue   . Dry skin   . GERD (gastroesophageal reflux disease)   . Hyperlipidemia   . Hypertension   . Hyperthyroidism   . Keloid    @ chest  . Memory loss   . Osteoarthritis   . Osteopenia    per dexa 12/09  . Paroxysmal atrial fibrillation ([place])    cards d/c coumadin 12/2008 d/t persisten NSR and frequent falls- restarted coumadin july 2011, now on Eliquis  . Recurrent UTI   . Shortness of breath dyspnea   . TIA (transient ischemic attack)     Past Surgical History:  Procedure Laterality Date  . ABDOMINAL HYSTERECTOMY    . AORTIC VALVE REPLACEMENT  1998  . CARDIAC VALVE REPLACEMENT    . CAROTID DOPPLER  07/13/12   BILATERAL BULB/PROXIMAL ICAS;MILD AMOUNT OF FIBROUS PLAQUE WITH NO DIAMETER REDUCTION.  . CESAREAN SECTION     x 2  . COLONOSCOPY WITH PROPOFOL N/A 03/30/2014   Procedure: COLONOSCOPY WITH PROPOFOL;  Surgeon: Irene Shipper, MD;  Location: [facility];  Service: Endoscopy;  Laterality: N/A;    . CORONARY ARTERY BYPASS GRAFT  1998   SVG TO RCA  . HEMORRHOID SURGERY    . JOINT REPLACEMENT    . MYOCARDIAL PERFUSION STUDY  12/19/09   NORMAL PATTERN OF PERFUSION IN ALL REGIONS.EF 75%.  . OOPHORECTOMY    . TOTAL KNEE ARTHROPLASTY  1999  . TRANSTHORACIC ECHOCARDIOGRAM  09/11/11   LVEF >55%.STAGE 1 DIASTOLIC DYSFUNCTION,ELEVATED LV FILLING PRESSURE.BIOPROSTHETIC AORTIC VALVE-PEAK AND MEAN GRADIENTS OF 17 MMHG AND 8 MMHG.SIGMOID SEPTUM.MILD TO MOD MR.MILD TO MOD TR.RVSP 46 MMHG.    Social History   Socioeconomic History  . Marital status: Divorced    Spouse name: Not on file  . Number of children: 4  . Years of education: Not on file  . Highest education level: Not on file  Social Needs  . Financial resource strain: Not on file  . Food insecurity - worry: Not on file  . Food insecurity - inability: Not on file  . Transportation needs - medical: Not on file  . Transportation needs - non-medical: Not on file  Occupational History  . Occupation: retired     Fish farm manager: RETIRED  Tobacco Use  . Smoking status: Former Research scientist (life sciences)  . Smokeless tobacco: Never Used  . Tobacco comment: quit 2004, smoked 1.5 ppd  Substance and Sexual Activity  . Alcohol use: No  Alcohol/week: 0.0 oz  . Drug use: No  . Sexual activity: Not on file  Other Topics Concern  . Not on file  Social History Narrative   Had to moved w/ [name]  ~ 01-2016   Had 3 daughter- 2 son  (lost oldest daughter and son), 2 living daughters in [place] as of 07/16/2017      Reactions   Hydrocodone Itching, Nausea Only   Tramadol Hcl Itching, Nausea Only      Medication List        Accurate as of 07/16/17  5:25 PM. Always use your most recent med list.          acetaminophen 500 MG tablet Commonly known as:  TYLENOL Take 1,000 mg by mouth every 6 (six) hours as needed for moderate pain (pain).   albuterol 0.63 MG/3ML nebulizer solution Commonly known as:  ACCUNEB Take 1 ampule by nebulization  every 6 (six) hours as needed for wheezing.   ALPRAZolam 0.25 MG tablet Commonly known as:  XANAX 1/2 or 1 tablet by mouth at night as needed for insomnia   aspirin 81 MG EC tablet Take 1 tablet (81 mg total) by mouth daily.   atorvastatin 40 MG tablet Commonly known as:  LIPITOR Take 1 tablet (40 mg total) by mouth at bedtime.   azithromycin 250 MG tablet Commonly known as:  ZITHROMAX Z-PAK 2 tabs a day the first day, then 1 tab a day x 4 days   benazepril 40 MG tablet Commonly known as:  LOTENSIN Take 40 mg by mouth daily.   budesonide 0.5 MG/2ML nebulizer solution Commonly known as:  PULMICORT USE 1 VIAL VIA NEBULIZER TWICE DAILY   CALCIUM 500 + D 500-125 MG-UNIT Tabs Generic drug:  Calcium Carbonate-Vitamin D Take 1 tablet by mouth at bedtime.   cholecalciferol 1000 units tablet Commonly known as:  VITAMIN D Take 1,000 Units by mouth at bedtime.   ELIQUIS 5 MG Tabs tablet Generic drug:  apixaban TAKE 1 TABLET BY MOUTH TWICE A DAY   esomeprazole 40 MG capsule Commonly known as:  NEXIUM Take 1 capsule (40 mg total) by mouth daily as needed.   furosemide 20 MG tablet Commonly known as:  LASIX Take 2 tablets (40 mg total) by mouth daily.   isosorbide mononitrate 30 MG 24 hr tablet Commonly known as:  IMDUR Take 1 tablet (30 mg total) by mouth daily.   Magnesium Oxide 400 (240 Mg) MG Tabs Take 1 tablet (400 mg total) by mouth daily.   methimazole 5 MG tablet Commonly known as:  TAPAZOLE Take 1 tablet (5 mg total) by mouth 3 (three) times a week.   metoprolol tartrate 25 MG tablet Commonly known as:  LOPRESSOR Take 1 tablet (25 mg total) by mouth 2 (two) times daily.   nitroGLYCERIN 0.4 MG SL tablet Commonly known as:  NITROSTAT Place 1 tablet (0.4 mg total) under the tongue every 5 (five) minutes x 3 doses as needed for chest pain.   potassium chloride 10 MEQ tablet Commonly known as:  KLOR-CON M10 Take 1 tablet (10 mEq total) by mouth daily.     predniSONE 10 MG tablet Commonly known as:  DELTASONE 2 tabs a day x 5 days          Objective:   Physical Exam BP 124/70 (BP Location: Left Arm, Patient Position: Sitting, Cuff Size: Small)   Pulse 83   Temp 98.7 F (37.1 C) (Oral)  Resp 14   Ht 5\' 2"  (1.575 m)   Wt 164 lb 2 oz (74.4 kg)   SpO2 94%   BMI 30.02 kg/m  General:   Well developed, well nourished, elderly lady in no physical distress HEENT:  Normocephalic . Face symmetric, atraumatic Nose not congested, TMs obscured by wax.  Throat symmetric, no red Lungs:  + Mild wheezing bilaterally, no rhonchi, no increased work of breathing Normal respiratory effort, no intercostal retractions, no accessory muscle use. Heart: irreg,  no murmur.  No pretibial edema bilaterally  Skin: Not pale. Not jaundice Neurologic:  alert, pleasant, cooperative Speech normal, gait assisted by a walker Psych--  No anxious or depressed appearing.      Assessment & Plan:   Assessment DM HTN Hyperlipidemia Hyperthyroidism -- used to see Dr. Loanne Drilling, last visit 01-2016, now f/u PCP, check labs x 2 q year GERD Asthma  Anxiety on xanax  CV: Dr. Claiborne Billings --CHF, CAD, CABG, Aortic valve replacement --P- atrial fibrillation, used to be on Coumadin, Eliquis rx after the stroke 3-15, d/c 03-2014 d/t GI bleed , on ASA 81. Dx TIA 08-2016, back on eliquis, asa d/c NEURO --TIAs (admited 08-2016), CVA 2015 --Dementia MMSE ~ 12 to 15  (12-2016) DJD Osteopenia 2009 Recurrent UTIs Daughter  Rowynn Mcweeney; lives w/ [name]  PLAN Acute asthma exacerbation: Patient is coughing and wheezing, nontoxic appearing, VSS.  Has a nebulizer at home. Plan: Prednisone, Zithromax, continue nebs, call if not improving. Here for an acute problem but will also address chronic issues: HTN: Recently benazepril dose increased to 40, she also takes Lasix, Imdur, metoprolol and potassium.  Reports very good ambulatory BPs.  BP here today is very good.  Check a BMP and  CBC DM: Diet controlled, check a A1c Hyperthyroidism: Currently on Tapazole, check TFTs RTC April 2019 already scheduled

## 2017-07-16 NOTE — Progress Notes (Signed)
Pre visit review using our clinic review tool, if applicable. No additional management support is needed unless otherwise documented below in the visit note. 

## 2017-07-16 NOTE — Patient Instructions (Addendum)
GO TO THE LAB : Get the blood work     Check the  blood pressure 2 or 3 times a month   Be sure your blood pressure is between 110/65 and  135/85. If it is consistently higher or lower, let me know  See you in April  For asthma:  Rest, fluids , tylenol  For cough:  Take Mucinex DM twice a day as needed until better  For nasal congestion: Use OTC   Flonase : 2 nasal sprays on each side of the nose in the morning until you feel better    Take the antibiotic as prescribed  (zithromax)  Prednisone for a few days  Continue nebulizations with: Pulmicort 0.5 twice a day Albuterol 0.63 every 6 hours  Call if not gradually better over the next  10 days  Call anytime if the symptoms are severe

## 2017-07-19 ENCOUNTER — Telehealth: Payer: Self-pay | Admitting: Internal Medicine

## 2017-07-21 DIAGNOSIS — H18421 Band keratopathy, right eye: Secondary | ICD-10-CM | POA: Diagnosis not present

## 2017-07-21 DIAGNOSIS — H2513 Age-related nuclear cataract, bilateral: Secondary | ICD-10-CM | POA: Diagnosis not present

## 2017-07-21 DIAGNOSIS — H18423 Band keratopathy, bilateral: Secondary | ICD-10-CM | POA: Diagnosis not present

## 2017-07-21 DIAGNOSIS — Z7901 Long term (current) use of anticoagulants: Secondary | ICD-10-CM | POA: Diagnosis not present

## 2017-07-21 NOTE — Telephone Encounter (Signed)
Pt is requesting refill on alprazolam.  Last OV: 07/16/2017 Last Fill: 12/10/2016 #30 and 0RF  UDS: Last was 2014  [place] printed- no issues noted  Please advise.

## 2017-07-21 NOTE — Telephone Encounter (Signed)
Sent!

## 2017-07-28 DIAGNOSIS — H18422 Band keratopathy, left eye: Secondary | ICD-10-CM | POA: Diagnosis not present

## 2017-07-28 DIAGNOSIS — Z48810 Encounter for surgical aftercare following surgery on the sense organs: Secondary | ICD-10-CM | POA: Diagnosis not present

## 2017-07-28 DIAGNOSIS — Z7901 Long term (current) use of anticoagulants: Secondary | ICD-10-CM | POA: Diagnosis not present

## 2017-07-28 DIAGNOSIS — H2513 Age-related nuclear cataract, bilateral: Secondary | ICD-10-CM | POA: Diagnosis not present

## 2017-07-29 ENCOUNTER — Other Ambulatory Visit: Payer: Self-pay | Admitting: Internal Medicine

## 2017-07-30 ENCOUNTER — Telehealth: Payer: Self-pay | Admitting: Internal Medicine

## 2017-07-30 MED ORDER — PREDNISONE 10 MG PO TABS: ORAL_TABLET | ORAL | 0 refills | Status: DC

## 2017-07-30 NOTE — Addendum Note (Signed)
Addended by: Kathlene November E on: 07/30/2017 05:40 PM   Modules accepted: Orders

## 2017-07-30 NOTE — Telephone Encounter (Signed)
Left message for pt to call office back to discuss current symptoms.

## 2017-07-30 NOTE — Telephone Encounter (Signed)
Pt's daughter [name] called to say she is better but is coughing up more clear phlegm. Daughter said pt is having more wheezing and asked if pt needs Prednisone.

## 2017-07-30 NOTE — Addendum Note (Signed)
Addended by: Kathlene November E on: 07/30/2017 05:41 PM   Modules accepted: Orders

## 2017-07-30 NOTE — Telephone Encounter (Addendum)
Spoke with the daughter, the patient still having some wheezing, mild cough and whitish sputum production but overall is better. No fever chills. Recommend to continue nebulizations and will add a second round of prednisone, Rx sent.

## 2017-07-30 NOTE — Telephone Encounter (Signed)
Please advise 

## 2017-07-30 NOTE — Telephone Encounter (Signed)
Copied from [facility] #[id number]. Topic: Quick Communication - See Telephone Encounter >> Jul 30, 2017 10:19 AM Boyd Kerbs wrote: CRM for notification. See Telephone encounter for:   Pt. Saw Dr. Larose Kells for cold symptoms and gave antibiotic, she is feeling better, but she still has a lot of flem and congestion. Asking if can call medicine in for this.   [id number] [address], [place] [address] Phone: [phone] Fax: [phone]    07/30/17.

## 2017-08-06 DIAGNOSIS — H2513 Age-related nuclear cataract, bilateral: Secondary | ICD-10-CM | POA: Diagnosis not present

## 2017-08-06 DIAGNOSIS — H18422 Band keratopathy, left eye: Secondary | ICD-10-CM | POA: Diagnosis not present

## 2017-08-06 DIAGNOSIS — Z48810 Encounter for surgical aftercare following surgery on the sense organs: Secondary | ICD-10-CM | POA: Diagnosis not present

## 2017-08-11 ENCOUNTER — Other Ambulatory Visit: Payer: Self-pay | Admitting: Cardiovascular Disease

## 2017-08-11 ENCOUNTER — Other Ambulatory Visit: Payer: Self-pay | Admitting: Internal Medicine

## 2017-08-29 DIAGNOSIS — Z885 Allergy status to narcotic agent status: Secondary | ICD-10-CM | POA: Diagnosis not present

## 2017-08-29 DIAGNOSIS — Z886 Allergy status to analgesic agent status: Secondary | ICD-10-CM | POA: Diagnosis not present

## 2017-08-29 DIAGNOSIS — H18421 Band keratopathy, right eye: Secondary | ICD-10-CM | POA: Diagnosis not present

## 2017-08-29 DIAGNOSIS — Z01818 Encounter for other preprocedural examination: Secondary | ICD-10-CM | POA: Diagnosis not present

## 2017-08-29 DIAGNOSIS — H2511 Age-related nuclear cataract, right eye: Secondary | ICD-10-CM | POA: Diagnosis not present

## 2017-09-03 ENCOUNTER — Other Ambulatory Visit: Payer: Self-pay | Admitting: Internal Medicine

## 2017-09-08 DIAGNOSIS — H25811 Combined forms of age-related cataract, right eye: Secondary | ICD-10-CM | POA: Diagnosis not present

## 2017-09-08 DIAGNOSIS — J45998 Other asthma: Secondary | ICD-10-CM | POA: Diagnosis not present

## 2017-09-08 DIAGNOSIS — I110 Hypertensive heart disease with heart failure: Secondary | ICD-10-CM | POA: Diagnosis not present

## 2017-09-08 DIAGNOSIS — I252 Old myocardial infarction: Secondary | ICD-10-CM | POA: Diagnosis not present

## 2017-09-08 DIAGNOSIS — I509 Heart failure, unspecified: Secondary | ICD-10-CM | POA: Diagnosis not present

## 2017-09-08 DIAGNOSIS — Z87891 Personal history of nicotine dependence: Secondary | ICD-10-CM | POA: Diagnosis not present

## 2017-09-08 DIAGNOSIS — I480 Paroxysmal atrial fibrillation: Secondary | ICD-10-CM | POA: Diagnosis not present

## 2017-09-08 DIAGNOSIS — Z952 Presence of prosthetic heart valve: Secondary | ICD-10-CM | POA: Diagnosis not present

## 2017-09-08 DIAGNOSIS — Z951 Presence of aortocoronary bypass graft: Secondary | ICD-10-CM | POA: Diagnosis not present

## 2017-09-09 DIAGNOSIS — H18422 Band keratopathy, left eye: Secondary | ICD-10-CM | POA: Diagnosis not present

## 2017-09-09 DIAGNOSIS — H2512 Age-related nuclear cataract, left eye: Secondary | ICD-10-CM | POA: Diagnosis not present

## 2017-09-09 DIAGNOSIS — Z961 Presence of intraocular lens: Secondary | ICD-10-CM | POA: Diagnosis not present

## 2017-09-09 DIAGNOSIS — Z7901 Long term (current) use of anticoagulants: Secondary | ICD-10-CM | POA: Diagnosis not present

## 2017-09-09 DIAGNOSIS — Z9889 Other specified postprocedural states: Secondary | ICD-10-CM | POA: Diagnosis not present

## 2017-09-09 DIAGNOSIS — Z48810 Encounter for surgical aftercare following surgery on the sense organs: Secondary | ICD-10-CM | POA: Diagnosis not present

## 2017-09-10 ENCOUNTER — Other Ambulatory Visit: Payer: Self-pay | Admitting: Internal Medicine

## 2017-09-10 NOTE — Telephone Encounter (Signed)
Okay to refill, see office visit 12-2016, she elected to continue anticoagulation.

## 2017-09-10 NOTE — Telephone Encounter (Signed)
Received request for Eliquis 5mg . Last Fill: 05/29/2017 #60 and 3rf. Okay to refill?

## 2017-09-12 DIAGNOSIS — Z48810 Encounter for surgical aftercare following surgery on the sense organs: Secondary | ICD-10-CM | POA: Diagnosis not present

## 2017-09-12 DIAGNOSIS — H2512 Age-related nuclear cataract, left eye: Secondary | ICD-10-CM | POA: Diagnosis not present

## 2017-09-12 DIAGNOSIS — Z7901 Long term (current) use of anticoagulants: Secondary | ICD-10-CM | POA: Diagnosis not present

## 2017-09-12 DIAGNOSIS — H18422 Band keratopathy, left eye: Secondary | ICD-10-CM | POA: Diagnosis not present

## 2017-09-12 DIAGNOSIS — Z961 Presence of intraocular lens: Secondary | ICD-10-CM | POA: Diagnosis not present

## 2017-09-24 ENCOUNTER — Ambulatory Visit (INDEPENDENT_AMBULATORY_CARE_PROVIDER_SITE_OTHER): Payer: Medicare Other | Admitting: Internal Medicine

## 2017-09-24 ENCOUNTER — Encounter: Payer: Self-pay | Admitting: Internal Medicine

## 2017-09-24 VITALS — BP 158/78 | HR 67 | Temp 97.5°F | Resp 16 | Ht 62.0 in | Wt 164.0 lb

## 2017-09-24 DIAGNOSIS — R21 Rash and other nonspecific skin eruption: Secondary | ICD-10-CM

## 2017-09-24 DIAGNOSIS — I4819 Other persistent atrial fibrillation: Secondary | ICD-10-CM

## 2017-09-24 DIAGNOSIS — I481 Persistent atrial fibrillation: Secondary | ICD-10-CM | POA: Diagnosis not present

## 2017-09-24 DIAGNOSIS — J45901 Unspecified asthma with (acute) exacerbation: Secondary | ICD-10-CM

## 2017-09-24 DIAGNOSIS — E785 Hyperlipidemia, unspecified: Secondary | ICD-10-CM

## 2017-09-24 DIAGNOSIS — I25810 Atherosclerosis of coronary artery bypass graft(s) without angina pectoris: Secondary | ICD-10-CM | POA: Diagnosis not present

## 2017-09-24 DIAGNOSIS — E1159 Type 2 diabetes mellitus with other circulatory complications: Secondary | ICD-10-CM | POA: Diagnosis not present

## 2017-09-24 LAB — LIPID PANEL
Cholesterol: 129 mg/dL (ref 0–200)
HDL: 44.3 mg/dL (ref >39.00)
LDL Cholesterol: 69 mg/dL (ref 0–99)
NONHDL: 84.21
TRIGLYCERIDES: 77 mg/dL (ref 0.0–149.0)
Total CHOL/HDL Ratio: 3
VLDL: 15.4 mg/dL (ref 0.0–40.0)

## 2017-09-24 LAB — ALT: ALT: 17 U/L (ref 0–35)

## 2017-09-24 LAB — AST: AST: 16 U/L (ref 0–37)

## 2017-09-24 MED ORDER — KETOCONAZOLE 2 % EX CREA: 1.0000 "application " | TOPICAL_CREAM | Freq: Every day | CUTANEOUS | 0 refills | Status: DC

## 2017-09-24 MED ORDER — ALBUTEROL SULFATE 0.63 MG/3ML IN NEBU: 1.0000 | INHALATION_SOLUTION | Freq: Four times a day (QID) | RESPIRATORY_TRACT | 3 refills | Status: DC | PRN

## 2017-09-24 NOTE — Progress Notes (Signed)
Subjective:    Patient ID: Rebecca Skinner, female    DOB: Dec 07, 1922, 84 y.o.   MRN: [id number]  DOS:  09/24/2017 Type of visit - description : rov Interval history: Here with her daughter, in general feeling well. Ambulatory BPs 130s, 140s. Still coughing, on and off since the last visit, + whitish sputum. Denies wheezing, chest pain, shortness of breath or hemoptysis.  Only taking one dose of pulmicort qd. Has a rash at the abdomen   Review of Systems Denies nausea, vomiting, diarrhea or blood in the stools.   No hematuria.  Past Medical History:  Diagnosis Date  . Anxiety 11/20/2011  . Asthma    [facility] DR Walt Geathers  . CAD (coronary artery disease)    s/p CABG s/p AO valve replacement ([place] [facility])  . CVA (cerebral infarction) 08-2013   multiple, L, d/t Afib, started eloquis  . Diabetes mellitus    type 2   . Dizziness    Chronic, admiet 07-2010,saw neuro, thought to be a peripheral issue   . Dry skin   . GERD (gastroesophageal reflux disease)   . Hyperlipidemia   . Hypertension   . Hyperthyroidism   . Keloid    @ chest  . Memory loss   . Osteoarthritis   . Osteopenia    per dexa 12/09  . Paroxysmal atrial fibrillation ([place])    cards d/c coumadin 12/2008 d/t persisten NSR and frequent falls- restarted coumadin july 2011, now on Eliquis  . Recurrent UTI   . Shortness of breath dyspnea   . TIA (transient ischemic attack)     Past Surgical History:  Procedure Laterality Date  . ABDOMINAL HYSTERECTOMY    . AORTIC VALVE REPLACEMENT  1998  . CARDIAC VALVE REPLACEMENT    . CAROTID DOPPLER  07/13/12   BILATERAL BULB/PROXIMAL ICAS;MILD AMOUNT OF FIBROUS PLAQUE WITH NO DIAMETER REDUCTION.  . CESAREAN SECTION     x 2  . COLONOSCOPY WITH PROPOFOL N/A 03/30/2014   Procedure: COLONOSCOPY WITH PROPOFOL;  Surgeon: Irene Shipper, MD;  Location: [facility];  Service: Endoscopy;  Laterality: N/A;  . CORONARY ARTERY BYPASS GRAFT  1998   SVG TO RCA  . HEMORRHOID SURGERY     . JOINT REPLACEMENT    . MYOCARDIAL PERFUSION STUDY  12/19/09   NORMAL PATTERN OF PERFUSION IN ALL REGIONS.EF 75%.  . OOPHORECTOMY    . TOTAL KNEE ARTHROPLASTY  1999  . TRANSTHORACIC ECHOCARDIOGRAM  09/11/11   LVEF >55%.STAGE 1 DIASTOLIC DYSFUNCTION,ELEVATED LV FILLING PRESSURE.BIOPROSTHETIC AORTIC VALVE-PEAK AND MEAN GRADIENTS OF 17 MMHG AND 8 MMHG.SIGMOID SEPTUM.MILD TO MOD MR.MILD TO MOD TR.RVSP 46 MMHG.    Social History   Socioeconomic History  . Marital status: Divorced    Spouse name: Not on file  . Number of children: 4  . Years of education: Not on file  . Highest education level: Not on file  Occupational History  . Occupation: retired     Fish farm manager: RETIRED  Social Needs  . Financial resource strain: Not on file  . Food insecurity:    Worry: Not on file    Inability: Not on file  . Transportation needs:    Medical: Not on file    Non-medical: Not on file  Tobacco Use  . Smoking status: Former Research scientist (life sciences)  . Smokeless tobacco: Never Used  . Tobacco comment: quit 2004, smoked 1.5 ppd  Substance and Sexual Activity  . Alcohol use: No    Alcohol/week: 0.0 oz  .  Drug use: No  . Sexual activity: Not on file  Lifestyle  . Physical activity:    Days per week: Not on file    Minutes per session: Not on file  . Stress: Not on file  Relationships  . Social connections:    Talks on phone: Not on file    Gets together: Not on file    Attends religious service: Not on file    Active member of club or organization: Not on file    Attends meetings of clubs or organizations: Not on file    Relationship status: Not on file  . Intimate partner violence:    Fear of current or ex partner: Not on file    Emotionally abused: Not on file    Physically abused: Not on file    Forced sexual activity: Not on file  Other Topics Concern  . Not on file  Social History Narrative   Had to moved w/ [name]  ~ 01-2016   Had 3 daughter- 2 son  (lost oldest daughter and son), 2 living  daughters in [place] as of 09/24/2017      Reactions   Hydrocodone Itching, Nausea Only   Tramadol Hcl Itching, Nausea Only      Medication List        Accurate as of 09/24/17 11:59 PM. Always use your most recent med list.          acetaminophen 500 MG tablet Commonly known as:  TYLENOL Take 1,000 mg by mouth every 6 (six) hours as needed for moderate pain (pain).   albuterol 0.63 MG/3ML nebulizer solution Commonly known as:  ACCUNEB Take 3 mLs (0.63 mg total) by nebulization every 6 (six) hours as needed for wheezing.   ALPRAZolam 0.25 MG tablet Commonly known as:  XANAX TAKE 1/2 TABLET OR TAKE 1 TABLET BY MOUTH AT NIGHT AS NEEDED FOR INSOMINIA   apixaban 5 MG Tabs tablet Commonly known as:  ELIQUIS Take 1 tablet (5 mg total) by mouth 2 (two) times daily.   aspirin 81 MG EC tablet Take 1 tablet (81 mg total) by mouth daily.   atorvastatin 40 MG tablet Commonly known as:  LIPITOR Take 1 tablet (40 mg total) by mouth at bedtime.   benazepril 40 MG tablet Commonly known as:  LOTENSIN Take 40 mg by mouth daily.   budesonide 0.5 MG/2ML nebulizer solution Commonly known as:  PULMICORT USE 1 VIAL VIA NEBULIZER TWICE DAILY   CALCIUM 500 + D 500-125 MG-UNIT Tabs Generic drug:  Calcium Carbonate-Vitamin D Take 1 tablet by mouth at bedtime.   cholecalciferol 1000 units tablet Commonly known as:  VITAMIN D Take 1,000 Units by mouth at bedtime.   esomeprazole 40 MG capsule Commonly known as:  NEXIUM Take 1 capsule (40 mg total) by mouth daily as needed.   furosemide 20 MG tablet Commonly known as:  LASIX Take 2 tablets (40 mg total) by mouth daily.   isosorbide mononitrate 30 MG 24 hr tablet Commonly known as:  IMDUR Take 1 tablet (30 mg total) by mouth daily.   ketoconazole 2 % cream Commonly known as:  NIZORAL Apply 1 application topically daily.   magnesium oxide 400 MG tablet Commonly known as:  MAG-OX Take 1 tablet (400 mg total) by  mouth daily.   methimazole 5 MG tablet Commonly known as:  TAPAZOLE TAKE 1 TABLET BY MOUTH 3 TIMES A WEEK   metoprolol tartrate 25 MG tablet Commonly known  as:  LOPRESSOR Take 1 tablet (25 mg total) by mouth 2 (two) times daily.   nitroGLYCERIN 0.4 MG SL tablet Commonly known as:  NITROSTAT Place 1 tablet (0.4 mg total) under the tongue every 5 (five) minutes x 3 doses as needed for chest pain.   potassium chloride 10 MEQ tablet Commonly known as:  KLOR-CON M10 Take 1 tablet (10 mEq total) by mouth daily.   prednisoLONE acetate 1 % ophthalmic suspension Commonly known as:  PRED FORTE 1 DROP IN SURGICAL EYE FOUR TIMES A DAY AFTER SURGERY          Objective:   Physical Exam BP (!) 158/78 (BP Location: Right Arm, Patient Position: Sitting, Cuff Size: Small)   Pulse 67   Temp (!) 97.5 F (36.4 C) (Oral)   Resp 16   Ht 5\' 2"  (1.575 m)   Wt 164 lb (74.4 kg)   SpO2 100%   BMI 30.00 kg/m  General:   Well developed, well nourished lady in no distress.  HEENT:  Normocephalic . Face symmetric, atraumatic Lungs:  CTA B Normal respiratory effort, no intercostal retractions, no accessory muscle use. Heart: Irregularly irregular No pretibial edema bilaterally  Skin: At the left lower abdomen skin fold is she has a area of mild redness and maceration.  No discharge Neurologic:  alert & oriented to self, recognizes her daughter.  Not oriented on time.  Speech normal, gait appropriate for age   Psych--  No anxious or depressed appearing.      Assessment & Plan:    Assessment DM HTN Hyperlipidemia Hyperthyroidism -- used to see Dr. Loanne Drilling, last visit 01-2016, now f/u PCP, check labs x 2 q year GERD Asthma  Anxiety on xanax  CV: Dr. Claiborne Billings --CHF, CAD, CABG, Aortic valve replacement --P- atrial fibrillation, used to be on Coumadin, Eliquis rx after the stroke 3-15, d/c 03-2014 d/t GI bleed , on ASA 81. Dx TIA 08-2016, back on eliquis, asa d/c. ASA restarted after  admission w/ CP 06-2017 [facility] --TIAs (admited 08-2016), CVA 2015 --Dementia MMSE ~ 12 to 15  (12-2016) DJD Osteopenia 2009 Recurrent UTIs Daughter  Julyana [place]; lives w/ Nash Dimmer  DM: Last A1c satisfactory, diet controlled HTN: Ambulatory BPs in the 130s, 140s.  Continue Lotensin, Lasix, Imdur, metoprolol, potassium.  Last BMP okay. Hyperlpidemia: On Lipitor, check a FLP, AST, ALT Asthma: Still coughing white sputum.  She is only using pulmicort qd, increase it to bid, restart  albuterol nebulizations as needed.  Call if no better CAD: On aspirin, no symptoms Atrial fibrillation: On Eliquis, no evidence of bleeding.  Last CBC satisfactory. Abdominal rash: Likely fungal, recommend Nizoral twice a day for 10 days RTC 3-4 months

## 2017-09-24 NOTE — Patient Instructions (Signed)
GO TO THE LAB : Get the blood work     GO TO THE FRONT DESK Schedule your next appointment for a checkup in 3 or 4 months    Check the  blood pressure   weekly  Be sure your blood pressure is between 110/65 and  135/85. If it is consistently higher or lower, let me know  Cough: Pulmicort nebulization twice a day Continue with albuterol 1, 2 or 3 times a day if needed. Robitussin-DM as needed If the cough is not improving in the next few weeks please let me know

## 2017-09-25 NOTE — Assessment & Plan Note (Signed)
DM: Last A1c satisfactory, diet controlled HTN: Ambulatory BPs in the 130s, 140s.  Continue Lotensin, Lasix, Imdur, metoprolol, potassium.  Last BMP okay. Hyperlpidemia: On Lipitor, check a FLP, AST, ALT Asthma: Still coughing white sputum.  She is only using pulmicort qd, increase it to bid, restart  albuterol nebulizations as needed.  Call if no better CAD: On aspirin, no symptoms Atrial fibrillation: On Eliquis, no evidence of bleeding.  Last CBC satisfactory. Abdominal rash: Likely fungal, recommend Nizoral twice a day for 10 days RTC 3-4 months

## 2017-09-27 IMAGING — CT CT CERVICAL SPINE W/O CM
3 of 5 series · 11 of 33 positions shown, 13 images · non-contrast
Comparison: 08/27/2013 CT angiogram of the neck.  High

CLINICAL DATA: [AGE]/o F; patient found lying and bled without eyes
open unable to speak. History of falling and unsteady gait.

EXAM:
CT CERVICAL SPINE WITHOUT CONTRAST
TECHNIQUE: Multidetector CT imaging of the cervical spine was performed without
intravenous contrast. Multiplanar CT image reconstructions were also
generated.

[Series 5: c_spine 2.0 3 bone · axial · 0.37mm/px · z∈[-193,-93]mm · 3 of 102 slices shown, 4 images]
[im 26/102  soft-tissue]
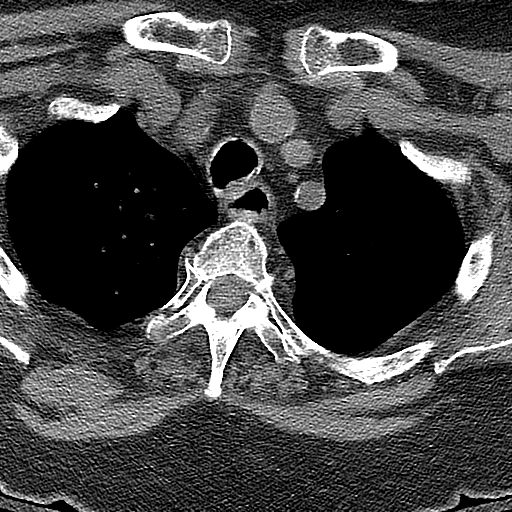
[im 26/102  bone]
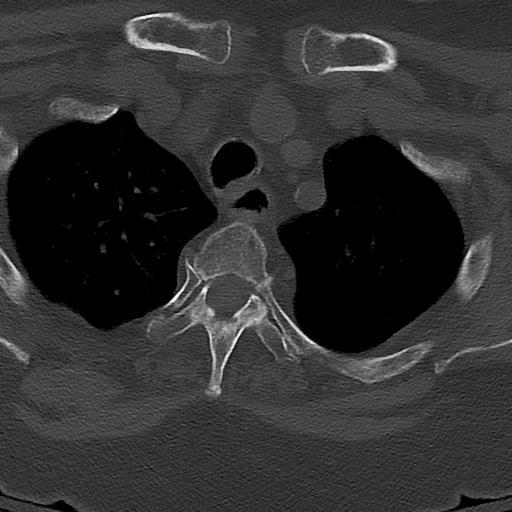
[im 51/102  bone]
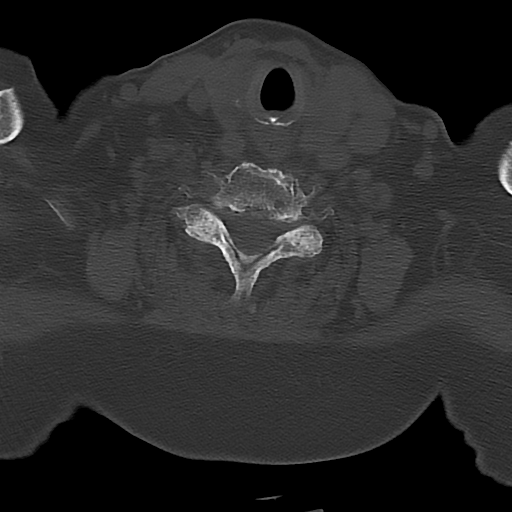
[im 76/102  bone]
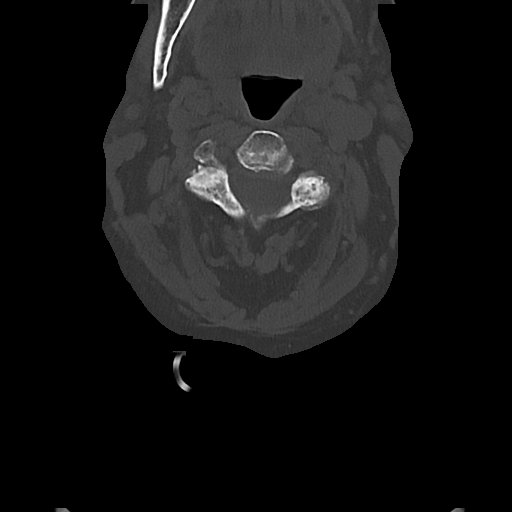

[Series 6: c_spine 2.0 sag bone · sagittal · 0.32mm/px · 5 of 61 slices shown, 6 images]
[im 21/61  bone]
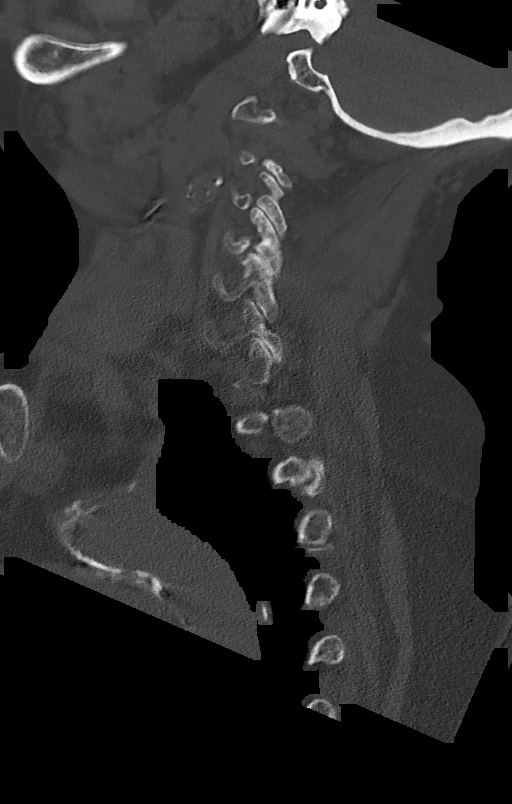
[im 26/61  bone]
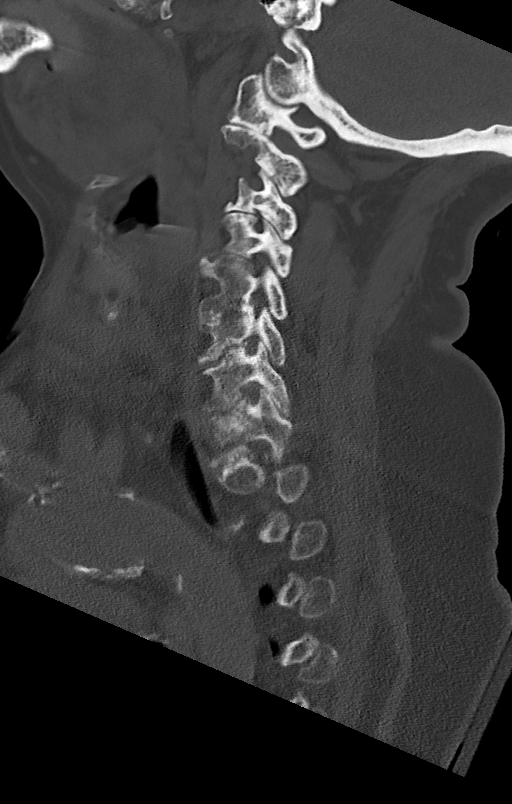
[im 31/61  soft-tissue]
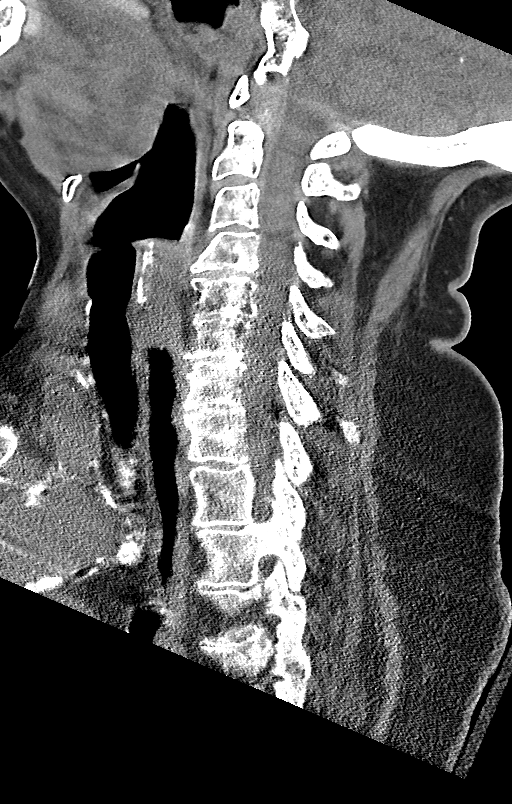
[im 31/61  bone]
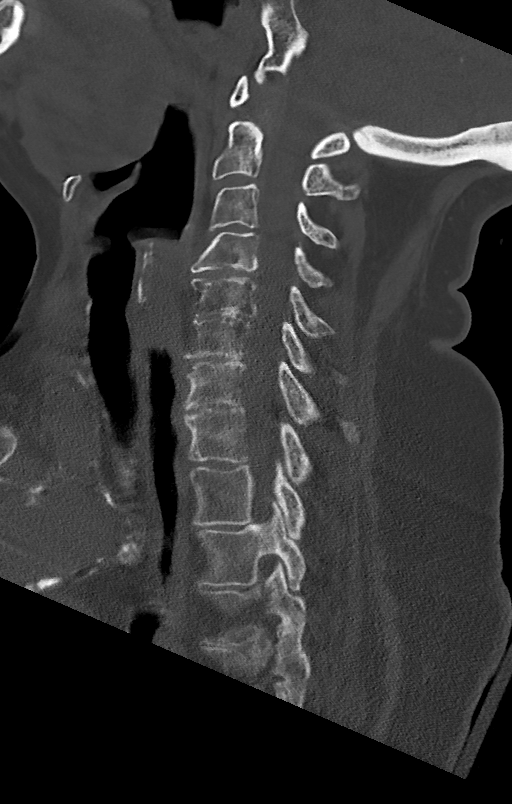
[im 36/61  bone]
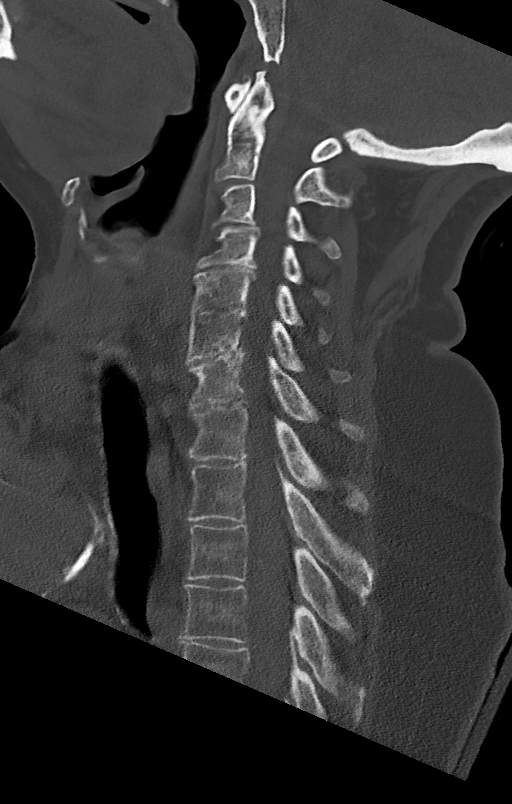
[im 41/61  bone]
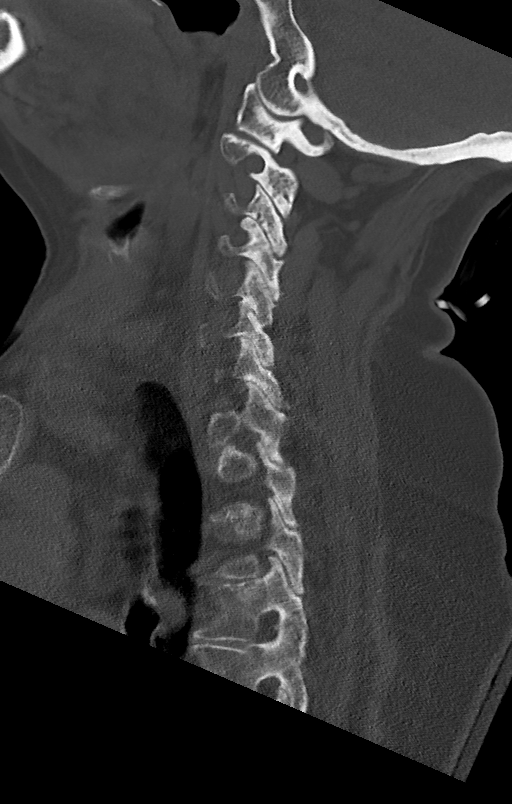

[Series 7: c_spine 2.0 cor bone · coronal · 0.31mm/px · 3 of 61 slices shown]
[im 13/61  bone]
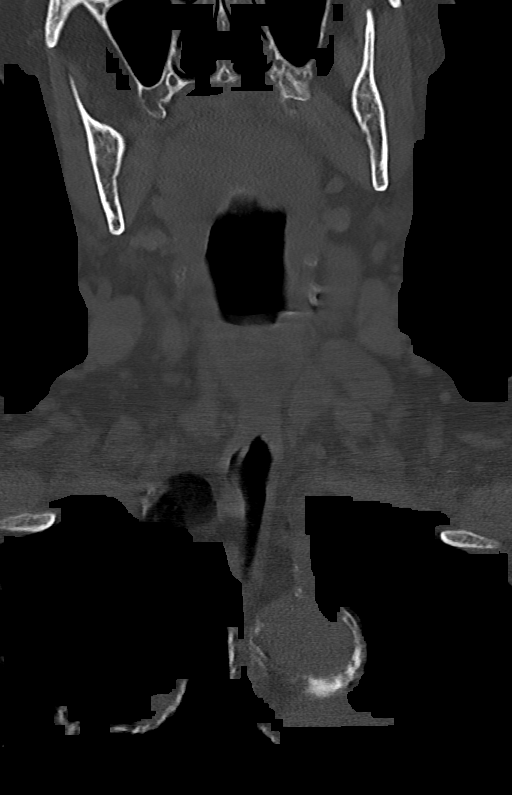
[im 25/61  bone]
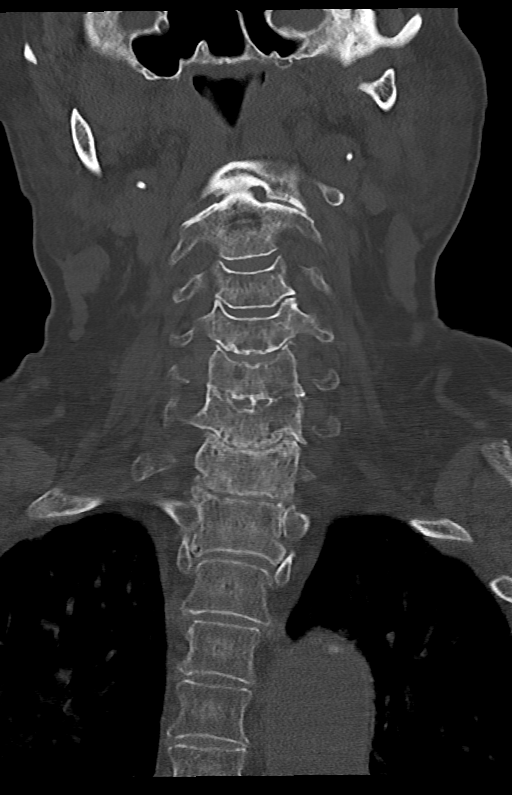
[im 37/61  bone]
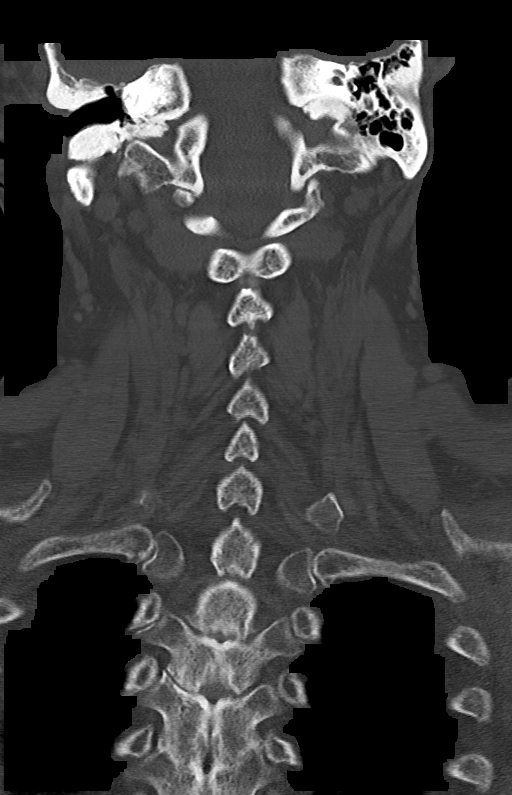

[11 of 33 positions shown; findings below may reference images not displayed]

FINDINGS: Alignment: Straightening of cervical lordosis. Stable minimal C3-4
anterolisthesis.

Skull base and vertebrae: No acute fracture. No primary bone lesion
or focal pathologic process.

Soft tissues and spinal canal: No prevertebral fluid or swelling. No
visible canal hematoma. Multinodular thyroid goiter. Otherwise no
discrete mass or cervical lymphadenopathy is identified on this
noncontrast CT. Patent aerodigestive tract. Calcifications of the
carotid bifurcations. Mildly patulous esophagus in the lower neck.

Disc levels: Moderate cervical spondylosis with endplate
degenerative changes and disc space narrowing greatest from C4 the
T1. Multilevel marginal osteophytes, uncovertebral hypertrophy, and
facet hypertrophy. No high-grade bony canal stenosis. Bilateral C3-4
and left C5-6 moderate foraminal narrowing. No significant interval
change.

Upper chest: Negative.

Other: Negative.
IMPRESSION: No acute fracture or malalignment of cervical spine identified.
Stable moderate cervical spondylosis.

By: February Kurup M.D.

## 2017-09-27 IMAGING — DX DG LUMBAR SPINE 2-3V
3 series · 3 of 3 positions shown · non-contrast
Comparison: Lumbar spine radiograph 03/26/2013

CLINICAL DATA: Midline spine pain

EXAM:
LUMBAR SPINE - 2-3 VIEW

[l-spine ap]
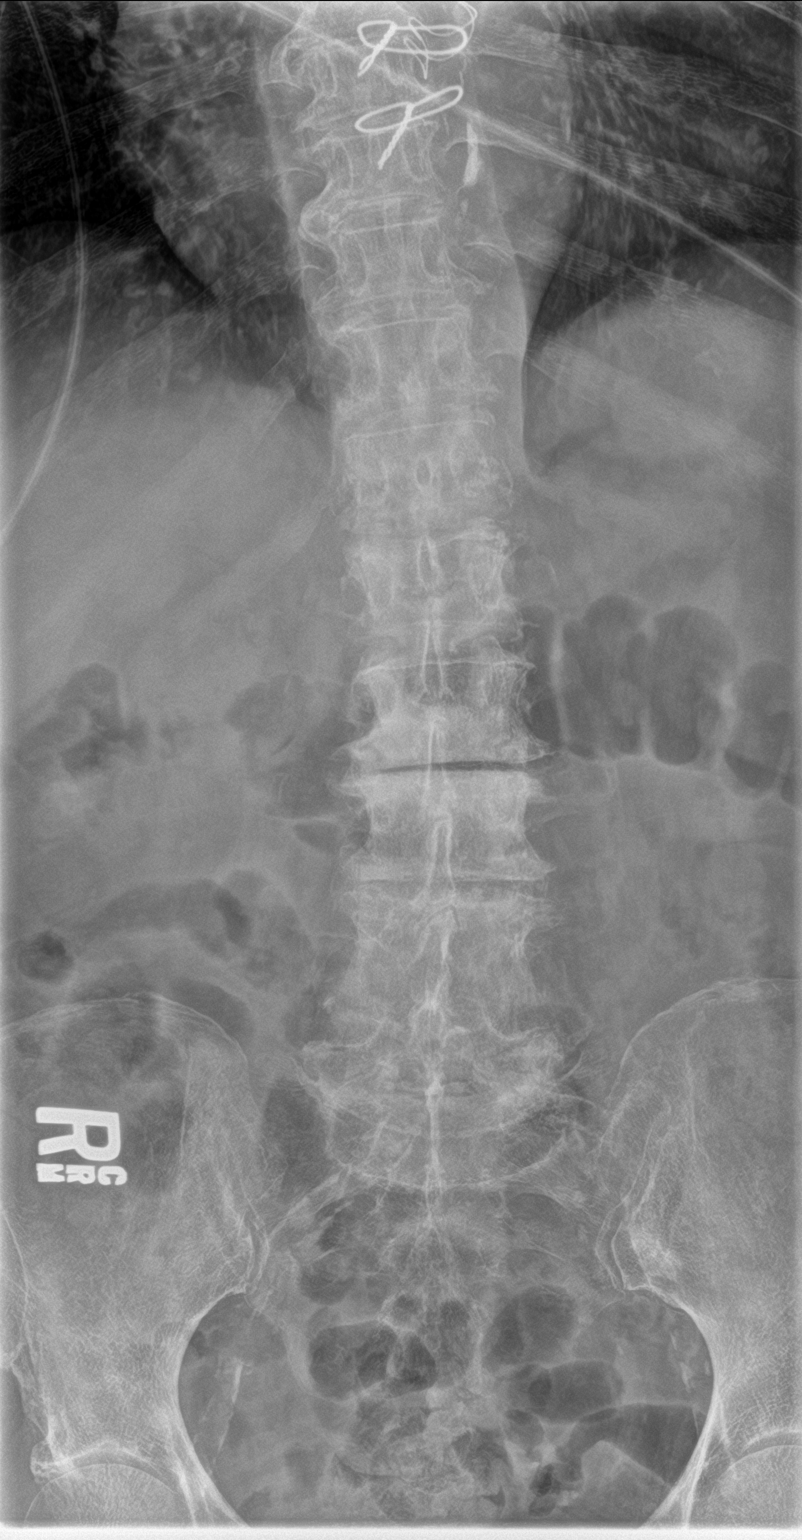

[l-spine lat]
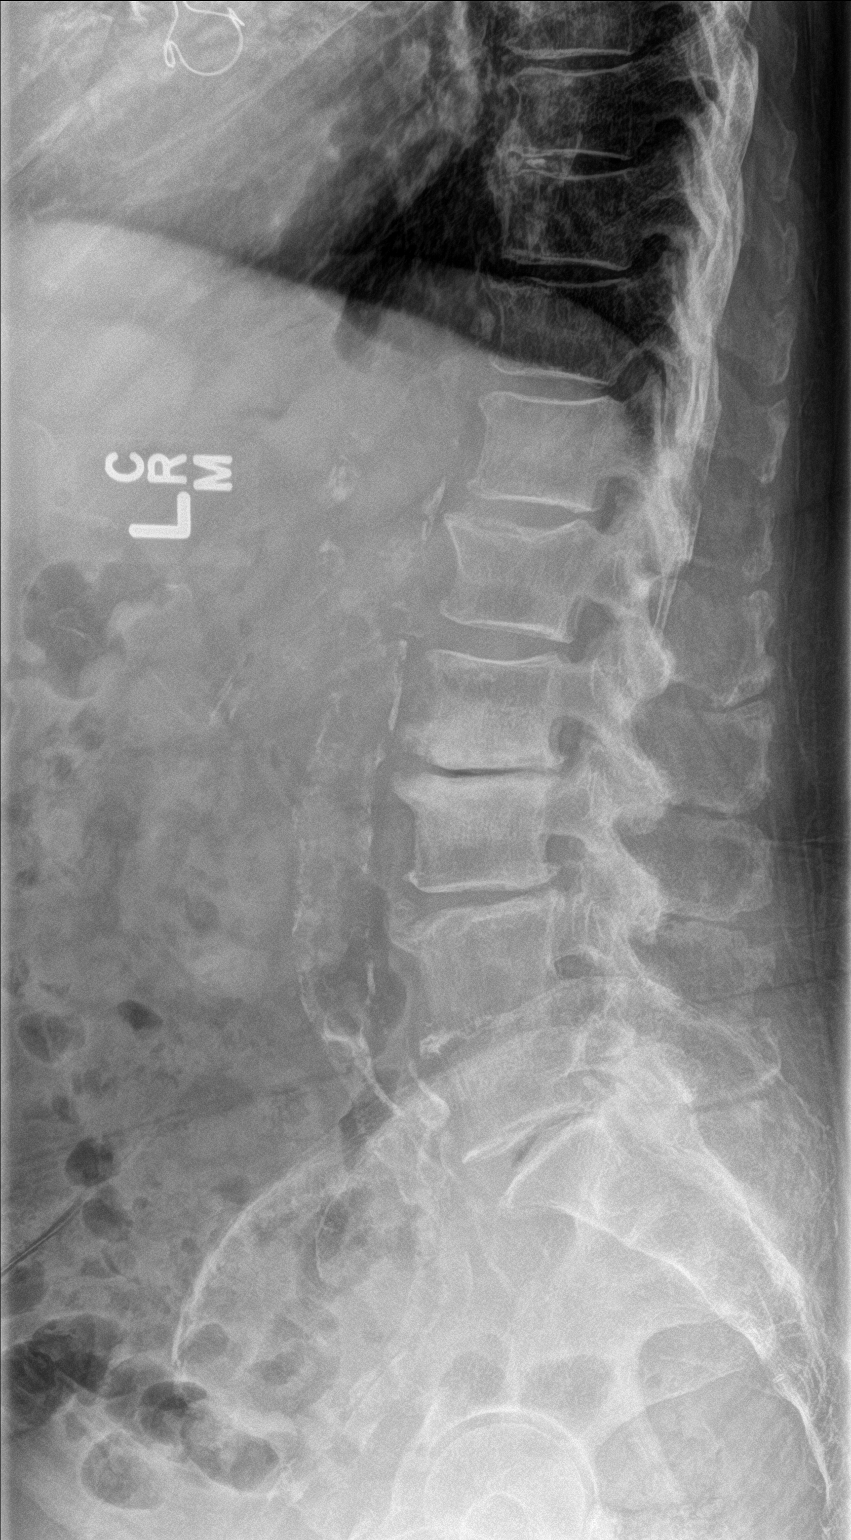

[l-spine spot]
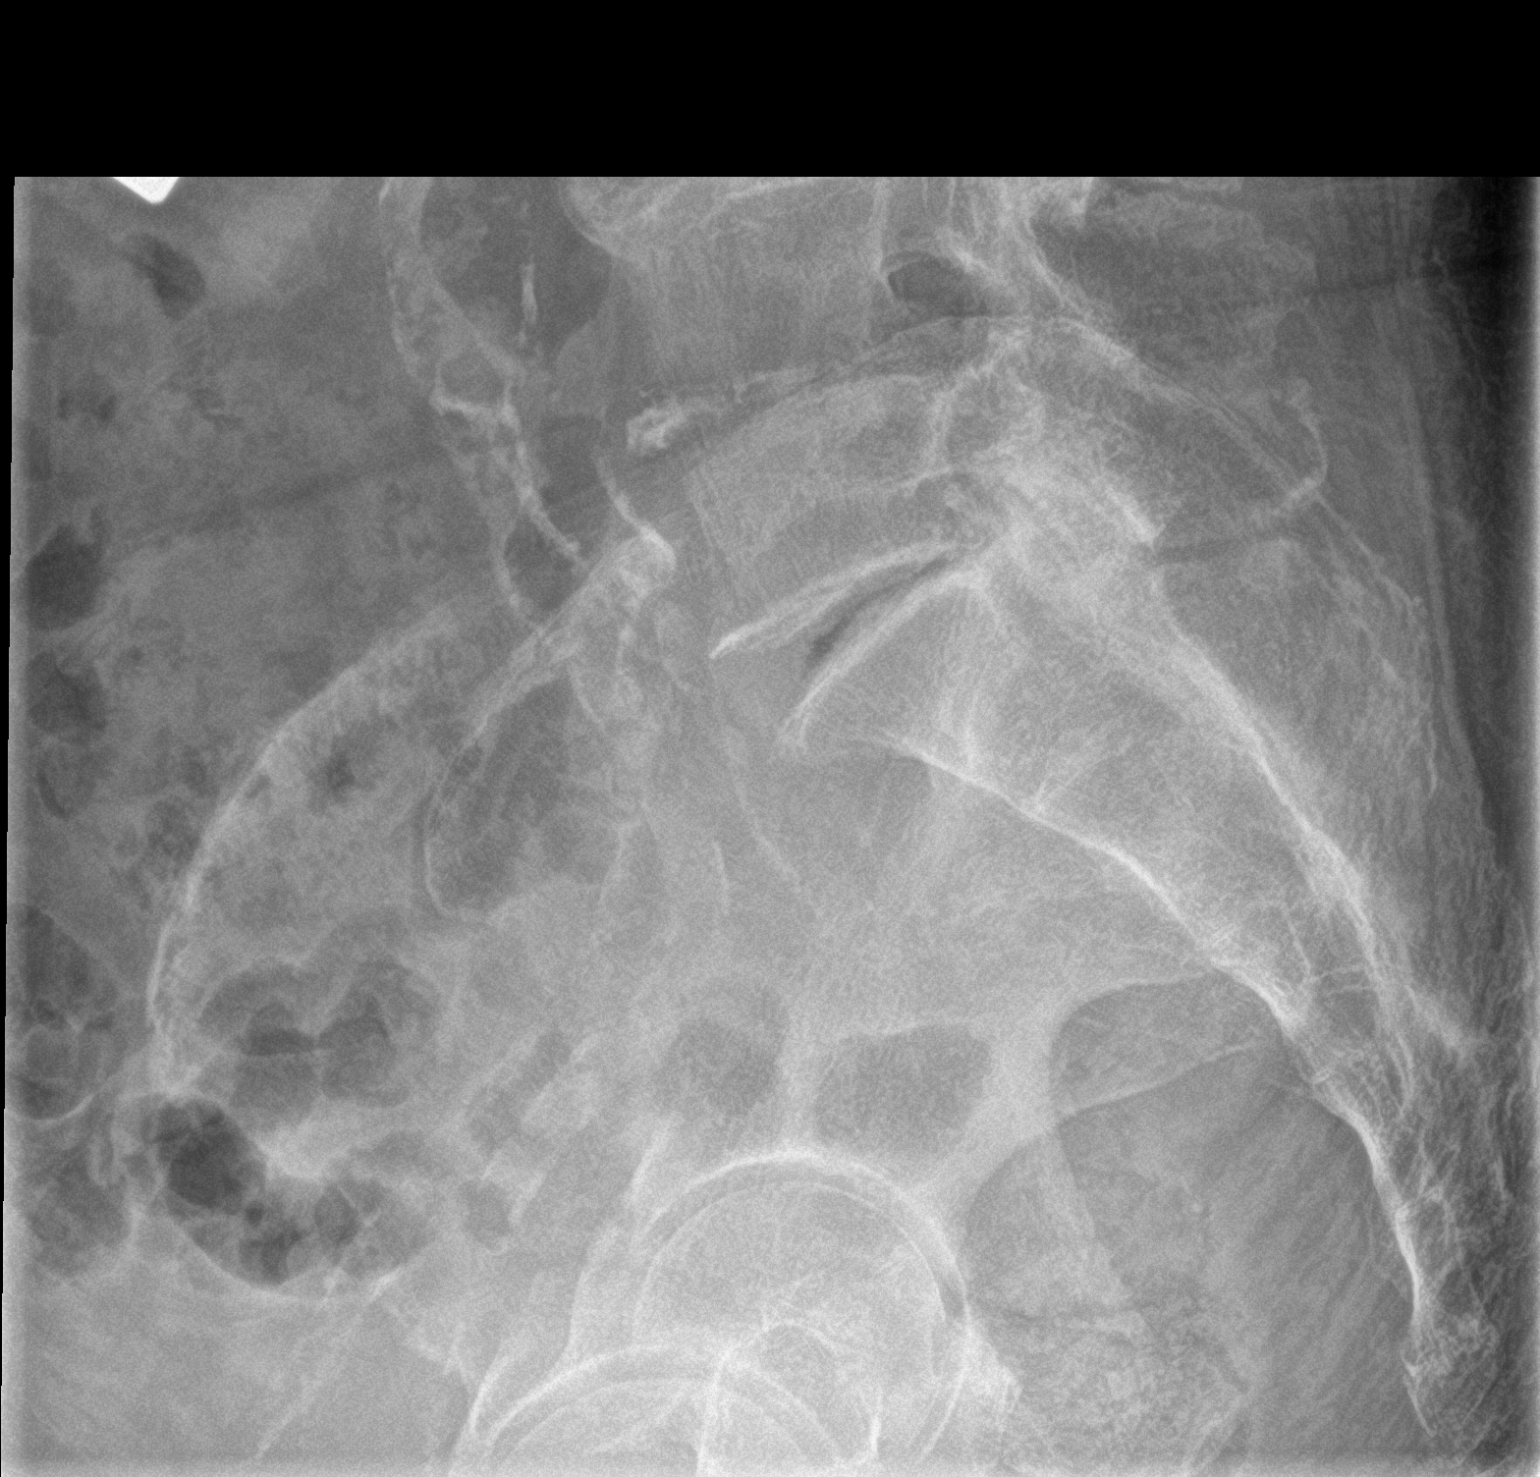

[3 of 3 positions shown; findings below may reference images not displayed]

FINDINGS: There is multilevel intervertebral disc space narrowing, greatest at
L4-5 and L2-3. Unchanged from the prior study. There is lower lumbar
facet arthrosis. No static subluxation. No acute fracture. There is
extensive atherosclerotic calcification within the abdominal aorta.
Mild multilevel lower thoracic height loss, unchanged.
IMPRESSION: 1. No acute fracture or static subluxation of the lumbar spine.
2. Advanced degenerative disc disease, greatest at L2-3 and L4-5.
3. Aortic atherosclerosis.

## 2017-10-06 ENCOUNTER — Other Ambulatory Visit: Payer: Self-pay | Admitting: Internal Medicine

## 2017-10-09 ENCOUNTER — Other Ambulatory Visit: Payer: Self-pay | Admitting: Cardiology

## 2017-10-09 NOTE — Telephone Encounter (Signed)
REFILL 

## 2017-10-15 ENCOUNTER — Ambulatory Visit (HOSPITAL_BASED_OUTPATIENT_CLINIC_OR_DEPARTMENT_OTHER)
Admission: RE | Admit: 2017-10-15 | Discharge: 2017-10-15 | Disposition: A | Discharge status: Home / Self Care | Payer: Medicare Other | Source: Ambulatory Visit | Attending: Internal Medicine | Admitting: Internal Medicine

## 2017-10-15 ENCOUNTER — Ambulatory Visit (INDEPENDENT_AMBULATORY_CARE_PROVIDER_SITE_OTHER): Payer: Medicare Other | Admitting: Internal Medicine

## 2017-10-15 ENCOUNTER — Encounter: Payer: Self-pay | Admitting: Internal Medicine

## 2017-10-15 VITALS — BP 128/84 | HR 70 | Temp 98.3°F | Resp 16 | Ht 62.0 in | Wt 168.5 lb

## 2017-10-15 DIAGNOSIS — M5030 Other cervical disc degeneration, unspecified cervical region: Secondary | ICD-10-CM | POA: Insufficient documentation

## 2017-10-15 DIAGNOSIS — I10 Essential (primary) hypertension: Secondary | ICD-10-CM | POA: Diagnosis not present

## 2017-10-15 DIAGNOSIS — R221 Localized swelling, mass and lump, neck: Secondary | ICD-10-CM

## 2017-10-15 LAB — BASIC METABOLIC PANEL
BUN: 12 mg/dL (ref 6–23)
CALCIUM: 9.7 mg/dL (ref 8.4–10.5)
CO2: 34 meq/L — AB (ref 19–32)
Chloride: 101 mEq/L (ref 96–112)
Creatinine, Ser: 0.95 mg/dL (ref 0.40–1.20)
GFR: 70.64 mL/min (ref >60.00)
GLUCOSE: 118 mg/dL — AB (ref 70–99)
Potassium: 5 mEq/L (ref 3.5–5.1)
SODIUM: 145 meq/L (ref 135–145)

## 2017-10-15 MED ORDER — IOPAMIDOL (ISOVUE-300) INJECTION 61%
100.0000 mL | Freq: Once | INTRAVENOUS | Status: AC | PRN
  Administered 2017-10-15: 75 mL via INTRAVENOUS

## 2017-10-15 NOTE — Progress Notes (Signed)
Pre visit review using our clinic review tool, if applicable. No additional management support is needed unless otherwise documented below in the visit note. 

## 2017-10-15 NOTE — Patient Instructions (Addendum)
GO TO THE LAB : Get the blood work    We will get a CT of your neck today

## 2017-10-15 NOTE — Progress Notes (Signed)
Subjective:    Patient ID: Rebecca Skinner, female    DOB: 10/16/1922, 84 y.o.   MRN: [id number]  DOS:  10/15/2017 Type of visit - description : acute  Interval history: She is here with her daughter, her main concern is neck pain, sxs are  on and off for the last 7 to 10 days. She points to the anterior neck. Does not get worse when she eats. No pain on the posterior neck Pain present when she lays down and is unclear to me if it gets worse when she moves her head around. Has not noticed any swelling.   Review of Systems No fever chills No injury or fall Appetite remains good and has no trouble eating.  Past Medical History:  Diagnosis Date  . Anxiety 11/20/2011  . Asthma    [facility] DR Keimani Laufer  . CAD (coronary artery disease)    s/p CABG s/p AO valve replacement ([place] [facility])  . CVA (cerebral infarction) 08-2013   multiple, L, d/t Afib, started eloquis  . Diabetes mellitus    type 2   . Dizziness    Chronic, admiet 07-2010,saw neuro, thought to be a peripheral issue   . Dry skin   . GERD (gastroesophageal reflux disease)   . Hyperlipidemia   . Hypertension   . Hyperthyroidism   . Keloid    @ chest  . Memory loss   . Osteoarthritis   . Osteopenia    per dexa 12/09  . Paroxysmal atrial fibrillation ([place])    cards d/c coumadin 12/2008 d/t persisten NSR and frequent falls- restarted coumadin july 2011, now on Eliquis  . Recurrent UTI   . Shortness of breath dyspnea   . TIA (transient ischemic attack)     Past Surgical History:  Procedure Laterality Date  . ABDOMINAL HYSTERECTOMY    . AORTIC VALVE REPLACEMENT  1998  . CARDIAC VALVE REPLACEMENT    . CAROTID DOPPLER  07/13/12   BILATERAL BULB/PROXIMAL ICAS;MILD AMOUNT OF FIBROUS PLAQUE WITH NO DIAMETER REDUCTION.  . CESAREAN SECTION     x 2  . COLONOSCOPY WITH PROPOFOL N/A 03/30/2014   Procedure: COLONOSCOPY WITH PROPOFOL;  Surgeon: Irene Shipper, MD;  Location: [facility];  Service: Endoscopy;  Laterality:  N/A;  . CORONARY ARTERY BYPASS GRAFT  1998   SVG TO RCA  . HEMORRHOID SURGERY    . JOINT REPLACEMENT    . MYOCARDIAL PERFUSION STUDY  12/19/09   NORMAL PATTERN OF PERFUSION IN ALL REGIONS.EF 75%.  . OOPHORECTOMY    . TOTAL KNEE ARTHROPLASTY  1999  . TRANSTHORACIC ECHOCARDIOGRAM  09/11/11   LVEF >55%.STAGE 1 DIASTOLIC DYSFUNCTION,ELEVATED LV FILLING PRESSURE.BIOPROSTHETIC AORTIC VALVE-PEAK AND MEAN GRADIENTS OF 17 MMHG AND 8 MMHG.SIGMOID SEPTUM.MILD TO MOD MR.MILD TO MOD TR.RVSP 46 MMHG.    Social History   Socioeconomic History  . Marital status: Divorced    Spouse name: Not on file  . Number of children: 4  . Years of education: Not on file  . Highest education level: Not on file  Occupational History  . Occupation: retired     Fish farm manager: RETIRED  Social Needs  . Financial resource strain: Not on file  . Food insecurity:    Worry: Not on file    Inability: Not on file  . Transportation needs:    Medical: Not on file    Non-medical: Not on file  Tobacco Use  . Smoking status: Former Research scientist (life sciences)  . Smokeless tobacco: Never  Used  . Tobacco comment: quit 2004, smoked 1.5 ppd  Substance and Sexual Activity  . Alcohol use: No    Alcohol/week: 0.0 oz  . Drug use: No  . Sexual activity: Not on file  Lifestyle  . Physical activity:    Days per week: Not on file    Minutes per session: Not on file  . Stress: Not on file  Relationships  . Social connections:    Talks on phone: Not on file    Gets together: Not on file    Attends religious service: Not on file    Active member of club or organization: Not on file    Attends meetings of clubs or organizations: Not on file    Relationship status: Not on file  . Intimate partner violence:    Fear of current or ex partner: Not on file    Emotionally abused: Not on file    Physically abused: Not on file    Forced sexual activity: Not on file  Other Topics Concern  . Not on file  Social History Narrative   Had to moved w/ [name]  ~  01-2016   Had 3 daughter- 2 son  (lost oldest daughter and son), 2 living daughters in [place] as of 10/15/2017      Reactions   Hydrocodone Itching, Nausea Only   Tramadol Hcl Itching, Nausea Only      Medication List        Accurate as of 10/15/17 11:59 PM. Always use your most recent med list.          acetaminophen 500 MG tablet Commonly known as:  TYLENOL Take 1,000 mg by mouth every 6 (six) hours as needed for moderate pain (pain).   albuterol 0.63 MG/3ML nebulizer solution Commonly known as:  ACCUNEB Take 3 mLs (0.63 mg total) by nebulization every 6 (six) hours as needed for wheezing.   ALPRAZolam 0.25 MG tablet Commonly known as:  XANAX TAKE 1/2 TABLET OR TAKE 1 TABLET BY MOUTH AT NIGHT AS NEEDED FOR INSOMINIA   apixaban 5 MG Tabs tablet Commonly known as:  ELIQUIS Take 1 tablet (5 mg total) by mouth 2 (two) times daily.   aspirin 81 MG EC tablet Take 1 tablet (81 mg total) by mouth daily.   atorvastatin 40 MG tablet Commonly known as:  LIPITOR Take 1 tablet (40 mg total) by mouth at bedtime.   benazepril 40 MG tablet Commonly known as:  LOTENSIN Take 40 mg by mouth daily.   budesonide 0.5 MG/2ML nebulizer solution Commonly known as:  PULMICORT USE 1 VIAL VIA NEBULIZER TWICE DAILY   CALCIUM 500 + D 500-125 MG-UNIT Tabs Generic drug:  Calcium Carbonate-Vitamin D Take 1 tablet by mouth at bedtime.   cholecalciferol 1000 units tablet Commonly known as:  VITAMIN D Take 1,000 Units by mouth at bedtime.   esomeprazole 40 MG capsule Commonly known as:  NEXIUM Take 1 capsule (40 mg total) by mouth daily as needed.   furosemide 20 MG tablet Commonly known as:  LASIX Take 2 tablets (40 mg total) by mouth daily.   isosorbide mononitrate 30 MG 24 hr tablet Commonly known as:  IMDUR TAKE 1 TABLET BY MOUTH EVERY DAY   ketoconazole 2 % cream Commonly known as:  NIZORAL Apply 1 application topically daily.   magnesium oxide 400 MG  tablet Commonly known as:  MAG-OX Take 1 tablet (400 mg total) by mouth daily.   methimazole  5 MG tablet Commonly known as:  TAPAZOLE TAKE 1 TABLET BY MOUTH 3 TIMES A WEEK   metoprolol tartrate 25 MG tablet Commonly known as:  LOPRESSOR Take 1 tablet (25 mg total) by mouth 2 (two) times daily.   nitroGLYCERIN 0.4 MG SL tablet Commonly known as:  NITROSTAT Place 1 tablet (0.4 mg total) under the tongue every 5 (five) minutes x 3 doses as needed for chest pain.   potassium chloride 10 MEQ tablet Commonly known as:  KLOR-CON M10 Take 1 tablet (10 mEq total) by mouth daily.   prednisoLONE acetate 1 % ophthalmic suspension Commonly known as:  PRED FORTE 1 DROP IN SURGICAL EYE FOUR TIMES A DAY AFTER SURGERY          Objective:   Physical Exam  Neck:     BP 128/84 (BP Location: Left Arm, Patient Position: Sitting, Cuff Size: Small)   Pulse 70   Temp 98.3 F (36.8 C) (Oral)   Resp 16   Ht 5\' 2"  (1.575 m)   Wt 168 lb 8 oz (76.4 kg)   SpO2 93%   BMI 30.82 kg/m  General:   Well developed, well nourished lady in no distress.  HEENT:  Normocephalic . Face symmetric, atraumatic Neck: No TTP at the cervical spine Looks symmetric on inspection. Thyroid is nonpalpable. No lymphadenopathies She does have minimally tender lump, pulsatile.  See graphic. No drooling. Throat: Symmetric. Lungs:  CTA B Normal respiratory effort, no intercostal retractions, no accessory muscle use. Heart: Irregularly irregular No pretibial edema bilaterally  Neurologic:  alert & oriented to self, recognizes her daughter.  Not oriented on time.  Speech normal, gait appropriate for age, uses a rolling walker Psych--  No anxious or depressed appearing.      Assessment & Plan:  Assessment DM HTN Hyperlipidemia Hyperthyroidism -- used to see Dr. Loanne Drilling, last visit 01-2016, now f/u PCP, check labs x 2 q year GERD Asthma  Anxiety on xanax  CV: Dr. Claiborne Billings --CHF, CAD, CABG, Aortic valve  replacement --P- atrial fibrillation, used to be on Coumadin, Eliquis rx after the stroke 3-15, d/c 03-2014 d/t GI bleed , on ASA 81. Dx TIA 08-2016, back on eliquis, asa d/c. ASA restarted after admission w/ CP 06-2017 [facility]UR[facility] --TIAs (admited 08-2016), CVA 2015 --Dementia MMSE ~ 12 to 15  (12-2016) DJD Osteopenia 2009 Recurrent UTIs Daughter  Britnie [place]; lives w/ [name]  PLAN Anterior neck pain, palpable neck mass: Neck pain is atypical for MSK issue, on exam I feel palpable, pulsatile mass.  Case discussed with radiology, the best test at this point would be a neck CT with contrast.  Will check a BMP stat and proceed with a CT.  Further advised with results. Recommend to call me anytime if her symptoms change

## 2017-10-16 NOTE — Assessment & Plan Note (Signed)
Anterior neck pain, palpable neck mass: Neck pain is atypical for MSK issue, on exam I feel palpable, pulsatile mass.  Case discussed with radiology, the best test at this point would be a neck CT with contrast.  Will check a BMP stat and proceed with a CT.  Further advised with results. Recommend to call me anytime if her symptoms change

## 2017-10-23 ENCOUNTER — Other Ambulatory Visit: Payer: Self-pay | Admitting: Internal Medicine

## 2017-10-23 MED ORDER — BENAZEPRIL HCL 40 MG PO TABS: 40.0000 mg | ORAL_TABLET | Freq: Every day | ORAL | 1 refills | Status: DC

## 2017-10-23 NOTE — Telephone Encounter (Signed)
Rx sent 

## 2017-10-23 NOTE — Telephone Encounter (Signed)
Refill request Lotensin -  Historical provider  Last visit  10/15/2017  Dr Larose Kells  Last filled 06/25/2017 Historical provider [facility] [address]  Pharmacy [facility] [address]

## 2017-10-23 NOTE — Telephone Encounter (Signed)
Copied from [id number] [id number]. Topic: Quick Communication - Rx Refill/Question >> Oct 23, 2017 10:23 AM Antonieta Iba C wrote: Medication: benazepril (LOTENSIN) 40 MG tablet -- pt is completely out of medication.  Has the patient contacted their pharmacy? Yes   (Agent: If no, request that the patient contact the pharmacy for the refill.)  Preferred Pharmacy (with phone number or street name): [id number] [address], [place]. [phone] (Phone) [phone] (Fax)     Agent: Please be advised that RX refills may take up to 3 business days. We ask that you follow-up with your pharmacy.

## 2017-10-28 ENCOUNTER — Other Ambulatory Visit: Payer: Self-pay | Admitting: Internal Medicine

## 2017-10-28 DIAGNOSIS — J45909 Unspecified asthma, uncomplicated: Secondary | ICD-10-CM

## 2017-11-25 ENCOUNTER — Telehealth: Payer: Self-pay | Admitting: Internal Medicine

## 2017-11-25 DIAGNOSIS — J45909 Unspecified asthma, uncomplicated: Secondary | ICD-10-CM

## 2017-11-25 MED ORDER — ALBUTEROL SULFATE 0.63 MG/3ML IN NEBU: 1.0000 | INHALATION_SOLUTION | Freq: Four times a day (QID) | RESPIRATORY_TRACT | 3 refills | Status: DC | PRN

## 2017-11-25 MED ORDER — BUDESONIDE 0.5 MG/2ML IN SUSP: 0.5000 mg | Freq: Two times a day (BID) | RESPIRATORY_TRACT | 5 refills | Status: DC

## 2017-11-25 NOTE — Telephone Encounter (Signed)
Spoke with the patient's daughter: She continue with cough and clear sputum as previous weeks. Currently on 1 dose of Pulmicort and ran out of albuterol. No fever, no chills.  No increasing the amount of sputum.  No hemoptysis. Plan: Increase Pulmicort to 1  nebulization twice a day Refill albuterol, up to 4 times daily however recommend to first try 1 neb twice a day as needed. They wonder if an antibiotic is needed, if both is not working will call next week and will consider a Z-Pak.

## 2017-11-25 NOTE — Telephone Encounter (Signed)
Copied from [place] [id number]. Topic: Quick Communication - See Telephone Encounter >> Nov 25, 2017  9:58 AM Ahmed Prima L wrote: CRM for notification. See Telephone encounter for: 11/25/17.  Daughter ( ruth ) called and said that she has been using the albuterol (ACCUNEB) 0.63 MG/3ML nebulizer solution for her and she is still coughing up mucus. She wants to know does she need to come back in or can Dr Larose Kells give her something else? I advised her she needed another appointment. She wanted me to send a note instead first.  Call back @ [phone]

## 2017-11-25 NOTE — Telephone Encounter (Signed)
Please advise 

## 2017-12-22 ENCOUNTER — Telehealth: Payer: Self-pay | Admitting: Internal Medicine

## 2017-12-22 NOTE — Telephone Encounter (Signed)
See message below, she was treated over the phone November 25, 2017, she is not better, needs office visit. Please arrange ------ [name] calling to give update on the patient stating that she is still coughing up sputum that is clear, no fever, or other symptoms. Minor wheezing. [name] is requesting additonal treatment.

## 2017-12-22 NOTE — Telephone Encounter (Signed)
Please call Pt's daughter [name] and schedule OV at their convenience. (40 minutes). Thank you.

## 2017-12-22 NOTE — Telephone Encounter (Signed)
Scheduled 12/24/17 @ 3pm

## 2017-12-22 NOTE — Telephone Encounter (Signed)
[name] calling to give update on the patient stating that she is still coughing up sputum that is clear, no fever, or other symptoms. Minor wheezing. [name] is requesting additonal treatment. Please advise.

## 2017-12-24 ENCOUNTER — Encounter: Payer: Self-pay | Admitting: Internal Medicine

## 2017-12-24 ENCOUNTER — Ambulatory Visit (INDEPENDENT_AMBULATORY_CARE_PROVIDER_SITE_OTHER): Payer: Medicare Other | Admitting: Internal Medicine

## 2017-12-24 VITALS — BP 136/64 | HR 83 | Temp 98.1°F | Resp 16 | Ht 62.0 in | Wt 170.0 lb

## 2017-12-24 DIAGNOSIS — J45901 Unspecified asthma with (acute) exacerbation: Secondary | ICD-10-CM | POA: Diagnosis not present

## 2017-12-24 MED ORDER — CEFUROXIME AXETIL 500 MG PO TABS: 500.0000 mg | ORAL_TABLET | Freq: Two times a day (BID) | ORAL | 0 refills | Status: DC

## 2017-12-24 MED ORDER — PREDNISONE 10 MG PO TABS: ORAL_TABLET | ORAL | 0 refills | Status: DC

## 2017-12-24 NOTE — Progress Notes (Signed)
Subjective:    Patient ID: Rebecca Skinner, female    DOB: 1922/12/29, 84 y.o.   MRN: [id number]  DOS:  12/24/2017 Type of visit - description : acute Interval history: Here with her daughter Symptoms started approximately 2 weeks ago, she had a persisting cough with clear sputum production. Cough was the most intense last week, this week has decreased to some extent.   Review of Systems No  Fever chills No sinus congestion or nasal discharge. No sore throat + Wheezing on and off. No lower extremity edema  Past Medical History:  Diagnosis Date  . Anxiety 11/20/2011  . Asthma    [facility] DR Samarion Ehle  . CAD (coronary artery disease)    s/p CABG s/p AO valve replacement ([facility])  . CVA (cerebral infarction) 08-2013   multiple, L, d/t Afib, started eloquis  . Diabetes mellitus    type 2   . Dizziness    Chronic, admiet 07-2010,saw neuro, thought to be a peripheral issue   . Dry skin   . GERD (gastroesophageal reflux disease)   . Hyperlipidemia   . Hypertension   . Hyperthyroidism   . Keloid    @ chest  . Memory loss   . Osteoarthritis   . Osteopenia    per dexa 12/09  . Paroxysmal atrial fibrillation ([place])    cards d/c coumadin 12/2008 d/t persisten NSR and frequent falls- restarted coumadin july 2011, now on Eliquis  . Recurrent UTI   . Shortness of breath dyspnea   . TIA (transient ischemic attack)     Past Surgical History:  Procedure Laterality Date  . ABDOMINAL HYSTERECTOMY    . AORTIC VALVE REPLACEMENT  1998  . CARDIAC VALVE REPLACEMENT    . CAROTID DOPPLER  07/13/12   BILATERAL BULB/PROXIMAL ICAS;MILD AMOUNT OF FIBROUS PLAQUE WITH NO DIAMETER REDUCTION.  . CESAREAN SECTION     x 2  . COLONOSCOPY WITH PROPOFOL N/A 03/30/2014   Procedure: COLONOSCOPY WITH PROPOFOL;  Surgeon: Irene Shipper, MD;  Location: [facility];  Service: Endoscopy;  Laterality: N/A;  . CORONARY ARTERY BYPASS GRAFT  1998   SVG TO RCA  . HEMORRHOID SURGERY    . JOINT  REPLACEMENT    . MYOCARDIAL PERFUSION STUDY  12/19/09   NORMAL PATTERN OF PERFUSION IN ALL REGIONS.EF 75%.  . OOPHORECTOMY    . TOTAL KNEE ARTHROPLASTY  1999  . TRANSTHORACIC ECHOCARDIOGRAM  09/11/11   LVEF >55%.STAGE 1 DIASTOLIC DYSFUNCTION,ELEVATED LV FILLING PRESSURE.BIOPROSTHETIC AORTIC VALVE-PEAK AND MEAN GRADIENTS OF 17 MMHG AND 8 MMHG.SIGMOID SEPTUM.MILD TO MOD MR.MILD TO MOD TR.RVSP 46 MMHG.    Social History   Socioeconomic History  . Marital status: Divorced    Spouse name: Not on file  . Number of children: 4  . Years of education: Not on file  . Highest education level: Not on file  Occupational History  . Occupation: retired     Fish farm manager: RETIRED  Social Needs  . Financial resource strain: Not on file  . Food insecurity:    Worry: Not on file    Inability: Not on file  . Transportation needs:    Medical: Not on file    Non-medical: Not on file  Tobacco Use  . Smoking status: Former Research scientist (life sciences)  . Smokeless tobacco: Never Used  . Tobacco comment: quit 2004, smoked 1.5 ppd  Substance and Sexual Activity  . Alcohol use: No    Alcohol/week: 0.0 oz  . Drug use: No  .  Sexual activity: Not on file  Lifestyle  . Physical activity:    Days per week: Not on file    Minutes per session: Not on file  . Stress: Not on file  Relationships  . Social connections:    Talks on phone: Not on file    Gets together: Not on file    Attends religious service: Not on file    Active member of club or organization: Not on file    Attends meetings of clubs or organizations: Not on file    Relationship status: Not on file  . Intimate partner violence:    Fear of current or ex partner: Not on file    Emotionally abused: Not on file    Physically abused: Not on file    Forced sexual activity: Not on file  Other Topics Concern  . Not on file  Social History Narrative   Had to moved w/ [name]  ~ 01-2016   Had 3 daughter- 2 son  (lost oldest daughter and son), 2 living daughters in  [place] as of 12/24/2017      Reactions   Hydrocodone Itching, Nausea Only   Tramadol Hcl Itching, Nausea Only      Medication List        Accurate as of 12/24/17 11:59 PM. Always use your most recent med list.          acetaminophen 500 MG tablet Commonly known as:  TYLENOL Take 1,000 mg by mouth every 6 (six) hours as needed for moderate pain (pain).   albuterol 0.63 MG/3ML nebulizer solution Commonly known as:  ACCUNEB Take 3 mLs (0.63 mg total) by nebulization every 6 (six) hours as needed for wheezing.   ALPRAZolam 0.25 MG tablet Commonly known as:  XANAX TAKE 1/2 TABLET OR TAKE 1 TABLET BY MOUTH AT NIGHT AS NEEDED FOR INSOMINIA   apixaban 5 MG Tabs tablet Commonly known as:  ELIQUIS Take 1 tablet (5 mg total) by mouth 2 (two) times daily.   aspirin 81 MG EC tablet Take 1 tablet (81 mg total) by mouth daily.   atorvastatin 40 MG tablet Commonly known as:  LIPITOR Take 1 tablet (40 mg total) by mouth at bedtime.   benazepril 40 MG tablet Commonly known as:  LOTENSIN Take 1 tablet (40 mg total) by mouth daily.   budesonide 0.5 MG/2ML nebulizer solution Commonly known as:  PULMICORT Take 2 mLs (0.5 mg total) by nebulization 2 (two) times daily.   CALCIUM 500 + D 500-125 MG-UNIT Tabs Generic drug:  Calcium Carbonate-Vitamin D Take 1 tablet by mouth at bedtime.   cefUROXime 500 MG tablet Commonly known as:  CEFTIN Take 1 tablet (500 mg total) by mouth 2 (two) times daily with a meal.   cholecalciferol 1000 units tablet Commonly known as:  VITAMIN D Take 1,000 Units by mouth at bedtime.   esomeprazole 40 MG capsule Commonly known as:  NEXIUM Take 1 capsule (40 mg total) by mouth daily as needed.   furosemide 20 MG tablet Commonly known as:  LASIX Take 2 tablets (40 mg total) by mouth daily.   isosorbide mononitrate 30 MG 24 hr tablet Commonly known as:  IMDUR TAKE 1 TABLET BY MOUTH EVERY DAY   ketoconazole 2 % cream Commonly  known as:  NIZORAL Apply 1 application topically daily.   magnesium oxide 400 MG tablet Commonly known as:  MAG-OX Take 1 tablet (400 mg total) by mouth daily.  methimazole 5 MG tablet Commonly known as:  TAPAZOLE TAKE 1 TABLET BY MOUTH 3 TIMES A WEEK   metoprolol tartrate 25 MG tablet Commonly known as:  LOPRESSOR Take 1 tablet (25 mg total) by mouth 2 (two) times daily.   nitroGLYCERIN 0.4 MG SL tablet Commonly known as:  NITROSTAT Place 1 tablet (0.4 mg total) under the tongue every 5 (five) minutes x 3 doses as needed for chest pain.   potassium chloride 10 MEQ tablet Commonly known as:  KLOR-CON M10 Take 1 tablet (10 mEq total) by mouth daily.   prednisoLONE acetate 1 % ophthalmic suspension Commonly known as:  PRED FORTE 1 DROP IN SURGICAL EYE FOUR TIMES A DAY AFTER SURGERY   predniSONE 10 MG tablet Commonly known as:  DELTASONE 2 tabs a day x 5 days          Objective:   Physical Exam BP 136/64 (BP Location: Right Arm, Patient Position: Sitting, Cuff Size: Small)   Pulse 83   Temp 98.1 F (36.7 C) (Oral)   Resp 16   Ht 5\' 2"  (1.575 m)   Wt 170 lb (77.1 kg)   SpO2 90%   BMI 31.09 kg/m  General:   Well developed, NAD, see BMI.  HEENT:  Normocephalic . Face symmetric, atraumatic.  Nose not congested.  Throat symmetric. Lungs:  Few rhonchi, few end expiratory wheezes, no crackles, poor inspiratory effort,  no intercostal retractions, no accessory muscle use. Heart: Irregular.  No pretibial edema bilaterally  Skin: Not pale. Not jaundice Neurologic:  alert & oriented X3.  Speech normal, gait appropriate for age and unassisted Psych--  Cognition and judgment appear intact.  Cooperative with normal attention span and concentration.  Behavior appropriate. No anxious or depressed appearing.      Assessment & Plan:   Assessment DM HTN Hyperlipidemia Hyperthyroidism -- used to see Dr. Loanne Drilling, last visit 01-2016, now f/u PCP, check labs x 2 q  year GERD Asthma  Anxiety on xanax  CV: Dr. Claiborne Billings --CHF, CAD, CABG, Aortic valve replacement --P- atrial fibrillation, used to be on Coumadin, Eliquis rx after the stroke 3-15, d/c 03-2014 d/t GI bleed , on ASA 81. Dx TIA 08-2016, back on eliquis, asa d/c. ASA restarted after admission w/ CP 06-2017 NEURO --TIAs (admited 08-2016), CVA 2015 --Dementia MMSE ~ 12 to 15  (12-2016) DJD Osteopenia 2009 Recurrent UTIs Daughter  Adabella Stanis; lives w/ [name]  PLAN Asthma, acute exacerbation: Symptoms c/w asthma exacerbation, O2 sat is lower than usual, she is in no distress at this point. She is using Pulmicort only once daily and albuterol at night only. Plan: Increase Pulmicort to twice daily, use albuterol more liberally.  Prednisone for 5 days, if not gradually better start antibiotics, Rx provided.  Definitely call if she gets worse. anterior neck pain, palpable neck mass: See last visit, CT was negative

## 2017-12-24 NOTE — Progress Notes (Signed)
Pre visit review using our clinic review tool, if applicable. No additional management support is needed unless otherwise documented below in the visit note. 

## 2017-12-24 NOTE — Patient Instructions (Signed)
Robitussin-DM OTC as needed  Pulmicort 0.5: Take one nebulization twice a day  Albuterol 0.63: Take a nebulization 3 or 4 times a day if coughing or wheezing  Take prednisone for 5 days  If you are not gradually improving, start using the antibiotic called Ceftin  Call if symptoms severe, fever, chills, shortness of breath or if you are not improving in the next few days

## 2017-12-25 NOTE — Assessment & Plan Note (Signed)
Asthma, acute exacerbation: Symptoms c/w asthma exacerbation, O2 sat is lower than usual, she is in no distress at this point. She is using Pulmicort only once daily and albuterol at night only. Plan: Increase Pulmicort to twice daily, use albuterol more liberally.  Prednisone for 5 days, if not gradually better start antibiotics, Rx provided.  Definitely call if she gets worse. anterior neck pain, palpable neck mass: See last visit, CT was negative

## 2017-12-26 ENCOUNTER — Inpatient Hospital Stay: Payer: Medicare Other | Admitting: Internal Medicine

## 2017-12-28 NOTE — Progress Notes (Signed)
Cardiology Office Note   Date:  12/29/2017   ID:  Rebecca Skinner, DOB April 04, 1923, MRN [id number]  PCP:  Colon Branch, MD  Cardiologist:  Dr. Claiborne Billings Chief Complaint  Patient presents with  . Follow-up    denies chest pains, slight SOB noted, denies swelling in hands/feet     History of Present Illness: Rebecca Skinner is a 84 y.o. female who presents for ongoing assessment and management of atrial fibrillation, bioprosthetic aortic valve, hypertension, and chest discomfort.  She was hospitalized in January for chest pain and found to have normal systolic function, and had been ruled out for ACS.  She was last seen in the office on 07/07/2017 and was without any cardiac complaints at that time.  Since being seen last, the patient has been seen by primary care physician Dr. Belinda Fisher, on 12/25/2017, and was found to have asthma exacerbation.  She was increased on her Pulmicort to twice daily and was advised to use albuterol more liberally.  The patient was placed on prednisone for 5 days, and if no better antibiotics will be prescribed.  She is feeling much better today.  No worsening shortness of breath complaints of palpitation or chest discomfort.  She is medically compliant.  Past Medical History:  Diagnosis Date  . Anxiety 11/20/2011  . Asthma    [facility]DER [facility] DR PAZ  . CAD (coronary artery disease)    s/p CABG s/p AO valve replacement ([facility])  . CVA (cerebral infarction) 08-2013   multiple, L, d/t Afib, started eloquis  . Diabetes mellitus    type 2   . Dizziness    Chronic, admiet 07-2010,saw neuro, thought to be a peripheral issue   . Dry skin   . GERD (gastroesophageal reflux disease)   . Hyperlipidemia   . Hypertension   . Hyperthyroidism   . Keloid    @ chest  . Memory loss   . Osteoarthritis   . Osteopenia    per dexa 12/09  . Paroxysmal atrial fibrillation ([place])    cards d/c coumadin 12/2008 d/t persisten NSR and frequent falls- restarted coumadin july 2011, now  on Eliquis  . Recurrent UTI   . Shortness of breath dyspnea   . TIA (transient ischemic attack)     Past Surgical History:  Procedure Laterality Date  . ABDOMINAL HYSTERECTOMY    . AORTIC VALVE REPLACEMENT  1998  . CARDIAC VALVE REPLACEMENT    . CAROTID DOPPLER  07/13/12   BILATERAL BULB/PROXIMAL ICAS;MILD AMOUNT OF FIBROUS PLAQUE WITH NO DIAMETER REDUCTION.  . CESAREAN SECTION     x 2  . COLONOSCOPY WITH PROPOFOL N/A 03/30/2014   Procedure: COLONOSCOPY WITH PROPOFOL;  Surgeon: Irene Shipper, MD;  Location: [facility];  Service: Endoscopy;  Laterality: N/A;  . CORONARY ARTERY BYPASS GRAFT  1998   SVG TO RCA  . HEMORRHOID SURGERY    . JOINT REPLACEMENT    . MYOCARDIAL PERFUSION STUDY  12/19/09   NORMAL PATTERN OF PERFUSION IN ALL REGIONS.EF 75%.  . OOPHORECTOMY    . TOTAL KNEE ARTHROPLASTY  1999  . TRANSTHORACIC ECHOCARDIOGRAM  09/11/11   LVEF >55%.STAGE 1 DIASTOLIC DYSFUNCTION,ELEVATED LV FILLING PRESSURE.BIOPROSTHETIC AORTIC VALVE-PEAK AND MEAN GRADIENTS OF 17 MMHG AND 8 MMHG.SIGMOID SEPTUM.MILD TO MOD MR.MILD TO MOD TR.RVSP 46 MMHG.     Current Outpatient Medications  Medication Sig Dispense Refill  . acetaminophen (TYLENOL) 500 MG tablet Take 1,000 mg by mouth every 6 (six) hours as needed  for moderate pain (pain).    Marland Kitchen albuterol (ACCUNEB) 0.63 MG/3ML nebulizer solution Take 3 mLs (0.63 mg total) by nebulization every 6 (six) hours as needed for wheezing. 75 mL 3  . ALPRAZolam (XANAX) 0.25 MG tablet TAKE 1/2 TABLET OR TAKE 1 TABLET BY MOUTH AT NIGHT AS NEEDED FOR INSOMINIA 30 tablet 0  . apixaban (ELIQUIS) 5 MG TABS tablet Take 1 tablet (5 mg total) by mouth 2 (two) times daily. 60 tablet 6  . aspirin EC 81 MG EC tablet Take 1 tablet (81 mg total) by mouth daily.    Marland Kitchen atorvastatin (LIPITOR) 40 MG tablet Take 1 tablet (40 mg total) by mouth at bedtime. 90 tablet 2  . benazepril (LOTENSIN) 40 MG tablet Take 1 tablet (40 mg total) by mouth daily. 90 tablet 1  . budesonide  (PULMICORT) 0.5 MG/2ML nebulizer solution Take 2 mLs (0.5 mg total) by nebulization 2 (two) times daily. 120 mL 5  . Calcium Carbonate-Vitamin D (CALCIUM 500 + D) 500-125 MG-UNIT TABS Take 1 tablet by mouth at bedtime.    . cefUROXime (CEFTIN) 500 MG tablet Take 1 tablet (500 mg total) by mouth 2 (two) times daily with a meal. 14 tablet 0  . cholecalciferol (VITAMIN D) 1000 UNITS tablet Take 1,000 Units by mouth at bedtime.     Marland Kitchen esomeprazole (NEXIUM) 40 MG capsule Take 1 capsule (40 mg total) by mouth daily as needed. 90 capsule 1  . furosemide (LASIX) 20 MG tablet Take 2 tablets (40 mg total) by mouth daily. 180 tablet 1  . isosorbide mononitrate (IMDUR) 30 MG 24 hr tablet TAKE 1 TABLET BY MOUTH EVERY DAY 30 tablet 4  . ketoconazole (NIZORAL) 2 % cream Apply 1 application topically daily. 30 g 0  . magnesium oxide (MAG-OX) 400 MG tablet Take 1 tablet (400 mg total) by mouth daily. 90 tablet 1  . methimazole (TAPAZOLE) 5 MG tablet TAKE 1 TABLET BY MOUTH 3 TIMES A WEEK 40 tablet 3  . metoprolol tartrate (LOPRESSOR) 25 MG tablet Take 1 tablet (25 mg total) by mouth 2 (two) times daily. 180 tablet 1  . nitroGLYCERIN (NITROSTAT) 0.4 MG SL tablet Place 1 tablet (0.4 mg total) under the tongue every 5 (five) minutes x 3 doses as needed for chest pain. 25 tablet 3  . potassium chloride (KLOR-CON M10) 10 MEQ tablet Take 1 tablet (10 mEq total) by mouth daily. 90 tablet 1  . prednisoLONE acetate (PRED FORTE) 1 % ophthalmic suspension 1 DROP IN SURGICAL EYE FOUR TIMES A DAY AFTER SURGERY  2   No current facility-administered medications for this visit.     Allergies:   Hydrocodone and Tramadol hcl    Social History:  The patient  reports that she has quit smoking. She has never used smokeless tobacco. She reports that she does not drink alcohol or use drugs.   Family History:  The patient's family history includes Coronary artery disease (age of onset: 71) in her son; Heart attack in her father;  Hypertension in her brother; Stroke in her brother.    ROS: All other systems are reviewed and negative. Unless otherwise mentioned in H&P    PHYSICAL EXAM: VS:  BP 122/74 (BP Location: Left Arm, Patient Position: Sitting)   Pulse 70   Ht 5\' 2"  (1.575 m)   Wt 171 lb 9.6 oz (77.8 kg)   BMI 31.39 kg/m  , BMI Body mass index is 31.39 kg/m. GEN: Well nourished, well developed, in no acute  distress  HEENT: normal  Neck: no JVD, carotid bruits, or masses Cardiac: IRRR; no murmurs, rubs, or gallops,no edema  Respiratory:  clear to auscultation bilaterally, normal work of breathing GI: soft, nontender, nondistended, + BS MS: no deformity or atrophy  Skin: warm and dry, no rash Neuro:  Strength and sensation are intact Psych: euthymic mood, full affect   EKG: Atrial fibrillation heart rate of 70 bpm.   Recent Labs: 07/16/2017: Hemoglobin 13.3; Platelets 166.0; TSH 2.14 09/24/2017: ALT 17 10/15/2017: BUN 12; Creatinine, Ser 0.95; Potassium 5.0; Sodium 145    Lipid Panel    Component Value Date/Time   CHOL 129 09/24/2017 0910   TRIG 77.0 09/24/2017 0910   HDL 44.30 09/24/2017 0910   CHOLHDL 3 09/24/2017 0910   VLDL 15.4 09/24/2017 0910   LDLCALC 69 09/24/2017 0910      Wt Readings from Last 3 Encounters:  12/29/17 171 lb 9.6 oz (77.8 kg)  12/24/17 170 lb (77.1 kg)  10/15/17 168 lb 8 oz (76.4 kg)      Other studies Reviewed: Echocardiogram 06-Jan-2018 Left ventricle: The cavity size was normal. Wall thickness was   increased in a pattern of mild LVH. Indeterminant diastolic   function (atrial fibrillation). Systolic function was normal. The   estimated ejection fraction was in the range of 60% to 65%. Wall   motion was normal; there were no regional wall motion   abnormalities. - Ventricular septum: Mildly D-shaped interventricular septum   suggesting RV pressure/volume overload. - Aortic valve: Bioprosthetic aortic valve replacement. There was   no stenosis. There was  trivial regurgitation. Mean gradient (S):   7 mm Hg. - Mitral valve: There was trivial regurgitation. - Left atrium: The atrium was mildly dilated. - Right ventricle: The cavity size was mildly dilated. Systolic   function was mildly reduced. - Tricuspid valve: There was moderate regurgitation. Peak RV-RA   gradient (S): 54 mm Hg. - Pulmonary arteries: PA peak pressure: 62 mm Hg (S). - Systemic veins: IVC measured 2.1 cm with < 50% respirophasic   variation, suggesting RA pressure 8 mmHg.  ASSESSMENT AND PLAN:  1.  Atrial fibrillation: Patient's heart rate is well controlled on current medication regimen.  She has no complaints of bleeding or excessive bruising on Eliquis.  We will continue current medication regimen.  2.  Chronic CHF: She is currently on furosemide 40 mg daily with potassium supplement.  We will check a BMET as most recent BMET in May revealed high normal potassium status.  3.  Hypertension: Blood pressures currently controlled on benazepril isosorbide and metoprolol.  No changes in her regimen at this time.  4.  Hypercholesterolemia: Continue atorvastatin as directed.  Labs per primary care  Current medicines are reviewed at length with the patient today.    Labs/ tests ordered today include: BMET  Phill Myron. West Pugh, ANP, AACC   12/29/2017 12:43 PM    [facility] [address] [place], [place] [address] Phone: [phone]; Fax: [phone]

## 2017-12-29 ENCOUNTER — Ambulatory Visit: Payer: Medicare Other | Admitting: Adult Health

## 2017-12-29 ENCOUNTER — Encounter: Payer: Self-pay | Admitting: Adult Health

## 2017-12-29 VITALS — BP 122/74 | HR 70 | Ht 62.0 in | Wt 171.6 lb

## 2017-12-29 DIAGNOSIS — I10 Essential (primary) hypertension: Secondary | ICD-10-CM

## 2017-12-29 DIAGNOSIS — E7800 Pure hypercholesterolemia, unspecified: Secondary | ICD-10-CM

## 2017-12-29 DIAGNOSIS — I481 Persistent atrial fibrillation: Secondary | ICD-10-CM | POA: Diagnosis not present

## 2017-12-29 DIAGNOSIS — E876 Hypokalemia: Secondary | ICD-10-CM | POA: Diagnosis not present

## 2017-12-29 DIAGNOSIS — E875 Hyperkalemia: Secondary | ICD-10-CM

## 2017-12-29 DIAGNOSIS — I4819 Other persistent atrial fibrillation: Secondary | ICD-10-CM

## 2017-12-29 LAB — BASIC METABOLIC PANEL
BUN / CREAT RATIO: 15 (ref 12–28)
BUN: 16 mg/dL (ref 10–36)
CHLORIDE: 98 mmol/L (ref 96–106)
CO2: 29 mmol/L (ref 20–29)
Calcium: 9.2 mg/dL (ref 8.7–10.3)
Creatinine, Ser: 1.05 mg/dL — ABNORMAL HIGH (ref 0.57–1.00)
GFR, EST AFRICAN AMERICAN: 53 mL/min/{1.73_m2} — AB (ref >59)
GFR, EST NON AFRICAN AMERICAN: 46 mL/min/{1.73_m2} — AB (ref >59)
Glucose: 106 mg/dL — ABNORMAL HIGH (ref 65–99)
POTASSIUM: 4 mmol/L (ref 3.5–5.2)
SODIUM: 143 mmol/L (ref 134–144)

## 2017-12-29 NOTE — Patient Instructions (Signed)
Medication Instructions:  NO CHANGES- Your physician recommends that you continue on your current medications as directed. Please refer to the Current Medication list given to you today.  If you need a refill on your cardiac medications before your next appointment, please call your pharmacy.  Labwork: BMET TODAY HERE IN OUR OFFICE AT [facility]  Take the provided lab slips with you to the lab for your blood draw.   Follow-Up: Your physician wants you to follow-up in: [place] should receive a reminder letter in the mail two months in advance. If you do not receive a letter, please call our office NOV 2019 to schedule the JAN 2020 follow-up appointment.   Thank you for choosing [facility] at [facility]!!

## 2018-01-04 ENCOUNTER — Other Ambulatory Visit: Payer: Self-pay | Admitting: Cardiology

## 2018-01-18 ENCOUNTER — Other Ambulatory Visit: Payer: Self-pay | Admitting: Internal Medicine

## 2018-01-22 ENCOUNTER — Other Ambulatory Visit: Payer: Self-pay | Admitting: Internal Medicine

## 2018-01-26 ENCOUNTER — Ambulatory Visit: Payer: Medicare Other | Admitting: Internal Medicine

## 2018-01-30 DIAGNOSIS — H26491 Other secondary cataract, right eye: Secondary | ICD-10-CM | POA: Diagnosis not present

## 2018-02-04 ENCOUNTER — Other Ambulatory Visit: Payer: Self-pay | Admitting: Internal Medicine

## 2018-02-20 ENCOUNTER — Other Ambulatory Visit: Payer: Self-pay | Admitting: Internal Medicine

## 2018-02-20 NOTE — Telephone Encounter (Signed)
RF sent.

## 2018-03-26 ENCOUNTER — Ambulatory Visit (INDEPENDENT_AMBULATORY_CARE_PROVIDER_SITE_OTHER): Payer: Medicare Other | Admitting: Medical

## 2018-03-26 ENCOUNTER — Other Ambulatory Visit: Payer: Medicare Other

## 2018-03-26 ENCOUNTER — Encounter: Payer: Self-pay | Admitting: Medical

## 2018-03-26 ENCOUNTER — Ambulatory Visit (HOSPITAL_BASED_OUTPATIENT_CLINIC_OR_DEPARTMENT_OTHER)
Admission: RE | Admit: 2018-03-26 | Discharge: 2018-03-26 | Disposition: A | Discharge status: Home / Self Care | Payer: Medicare Other | Source: Ambulatory Visit | Attending: Medical | Admitting: Medical

## 2018-03-26 VITALS — BP 140/70 | HR 96 | Temp 97.9°F | Resp 16 | Ht 62.0 in | Wt 170.6 lb

## 2018-03-26 DIAGNOSIS — R05 Cough: Secondary | ICD-10-CM | POA: Insufficient documentation

## 2018-03-26 DIAGNOSIS — R0989 Other specified symptoms and signs involving the circulatory and respiratory systems: Secondary | ICD-10-CM | POA: Insufficient documentation

## 2018-03-26 DIAGNOSIS — R059 Cough, unspecified: Secondary | ICD-10-CM

## 2018-03-26 DIAGNOSIS — R062 Wheezing: Secondary | ICD-10-CM

## 2018-03-26 DIAGNOSIS — J40 Bronchitis, not specified as acute or chronic: Secondary | ICD-10-CM | POA: Diagnosis not present

## 2018-03-26 LAB — CBC WITH DIFFERENTIAL/PLATELET
BASOS PCT: 0.9 % (ref 0.0–3.0)
Basophils Absolute: 0.1 10*3/uL (ref 0.0–0.1)
EOS PCT: 1.1 % (ref 0.0–5.0)
Eosinophils Absolute: 0.1 10*3/uL (ref 0.0–0.7)
HEMATOCRIT: 40.7 % (ref 36.0–46.0)
HEMOGLOBIN: 13.3 g/dL (ref 12.0–15.0)
Lymphocytes Relative: 21.5 % (ref 12.0–46.0)
Lymphs Abs: 1.9 10*3/uL (ref 0.7–4.0)
MCHC: 32.8 g/dL (ref 30.0–36.0)
MCV: 97.9 fl (ref 78.0–100.0)
MONO ABS: 1 10*3/uL (ref 0.1–1.0)
MONOS PCT: 10.9 % (ref 3.0–12.0)
Neutro Abs: 5.8 10*3/uL (ref 1.4–7.7)
Neutrophils Relative %: 65.6 % (ref 43.0–77.0)
Platelets: 197 10*3/uL (ref 150.0–400.0)
RBC: 4.16 Mil/uL (ref 3.87–5.11)
RDW: 14.6 % (ref 11.5–15.5)
WBC: 8.8 10*3/uL (ref 4.0–10.5)

## 2018-03-26 MED ORDER — BENZONATATE 100 MG PO CAPS: 100.0000 mg | ORAL_CAPSULE | Freq: Three times a day (TID) | ORAL | 0 refills | Status: DC | PRN

## 2018-03-26 MED ORDER — AZITHROMYCIN 250 MG PO TABS: ORAL_TABLET | ORAL | 0 refills | Status: DC

## 2018-03-26 MED ORDER — PREDNISONE 10 MG PO TABS: ORAL_TABLET | ORAL | 0 refills | Status: DC

## 2018-03-26 NOTE — Patient Instructions (Addendum)
You appear to have bronchitis. Rest hydrate and tylenol for fever. I am prescribing cough medicine benzonatate, and azithromycin antibiotic. For your nasal congestion can use flonase.  For wheezing use pulmicort two time daily and albuterol up to every 8 hours if needed. Start 6 prednisone taper dose today.  Please get chest x-ray today and a CBC.  If chest x-ray shows pneumonia then would likely add Augmentin antibiotic in addition to continuing azithromycin.  If bilateral pneumonia shows or significantly high WBC count then might recommend ED evaluation for possible hospitalization.  With your age will proceed with caution and if signs symptoms worsen would also recommend ED evaluation.  Follow up in 7 days or as needed

## 2018-03-26 NOTE — Progress Notes (Signed)
Subjective:    Patient ID: Rebecca Skinner, female    DOB: 1922/11/26, 84 y.o.   MRN: [id number]  HPI  Pt in feeling sick since Saturday.   Pt has chest congestion, cough, st and wheezing. Pt cough is productive. No fever, no chills,no sweats or body aches.  Pt daughter gave her breathing treatment this morning. Seemed to help decrease wheeze. Pt has pulmicort and albuterol available.   Wheezing described as constant/audible since Saturday.  Pt does have history of diabetes but last  a1-c was 6.5   Pt sugar level is 110 today.      Review of Systems  Constitutional: Negative for chills, fatigue and fever.  HENT: Positive for congestion. Negative for ear discharge and ear pain.   Respiratory: Positive for cough and wheezing. Negative for chest tightness and shortness of breath.        Chest congested  Cardiovascular: Negative for chest pain and palpitations.  Gastrointestinal: Negative for abdominal pain.  Musculoskeletal: Negative for back pain.  Skin: Negative for rash.  Hematological: Negative for adenopathy. Does not bruise/bleed easily.  Psychiatric/Behavioral: Negative for behavioral problems and confusion. The patient is not nervous/anxious.     Past Medical History:  Diagnosis Date  . Anxiety 11/20/2011  . Asthma    [facility] DR PAZ  . CAD (coronary artery disease)    s/p CABG s/p AO valve replacement ([place] [facility])  . CVA (cerebral infarction) 08-2013   multiple, L, d/t Afib, started eloquis  . Diabetes mellitus    type 2   . Dizziness    Chronic, admiet 07-2010,saw neuro, thought to be a peripheral issue   . Dry skin   . GERD (gastroesophageal reflux disease)   . Hyperlipidemia   . Hypertension   . Hyperthyroidism   . Keloid    @ chest  . Memory loss   . Osteoarthritis   . Osteopenia    per dexa 12/09  . Paroxysmal atrial fibrillation ([place])    cards d/c coumadin 12/2008 d/t persisten NSR and frequent falls- restarted coumadin july 2011, now on  Eliquis  . Recurrent UTI   . Shortness of breath dyspnea   . TIA (transient ischemic attack)      Social History   Socioeconomic History  . Marital status: Divorced    Spouse name: Not on file  . Number of children: 4  . Years of education: Not on file  . Highest education level: Not on file  Occupational History  . Occupation: retired     Fish farm manager: RETIRED  Social Needs  . Financial resource strain: Not on file  . Food insecurity:    Worry: Not on file    Inability: Not on file  . Transportation needs:    Medical: Not on file    Non-medical: Not on file  Tobacco Use  . Smoking status: Former Research scientist (life sciences)  . Smokeless tobacco: Never Used  . Tobacco comment: quit 2004, smoked 1.5 ppd  Substance and Sexual Activity  . Alcohol use: No    Alcohol/week: 0.0 standard drinks  . Drug use: No  . Sexual activity: Not on file  Lifestyle  . Physical activity:    Days per week: Not on file    Minutes per session: Not on file  . Stress: Not on file  Relationships  . Social connections:    Talks on phone: Not on file    Gets together: Not on file    Attends religious service:  Not on file    Active member of club or organization: Not on file    Attends meetings of clubs or organizations: Not on file    Relationship status: Not on file  . Intimate partner violence:    Fear of current or ex partner: Not on file    Emotionally abused: Not on file    Physically abused: Not on file    Forced sexual activity: Not on file  Other Topics Concern  . Not on file  Social History Narrative   Had to moved w/ [name]  ~ 01-2016   Had 3 daughter- 2 son  (lost oldest daughter and son), 2 living daughters in [place]    Past Surgical History:  Procedure Laterality Date  . ABDOMINAL HYSTERECTOMY    . AORTIC VALVE REPLACEMENT  1998  . CARDIAC VALVE REPLACEMENT    . CAROTID DOPPLER  07/13/12   BILATERAL BULB/PROXIMAL ICAS;MILD AMOUNT OF FIBROUS PLAQUE WITH NO DIAMETER REDUCTION.  . CESAREAN  SECTION     x 2  . COLONOSCOPY WITH PROPOFOL N/A 03/30/2014   Procedure: COLONOSCOPY WITH PROPOFOL;  Surgeon: Irene Shipper, MD;  Location: [facility];  Service: Endoscopy;  Laterality: N/A;  . CORONARY ARTERY BYPASS GRAFT  1998   SVG TO RCA  . HEMORRHOID SURGERY    . JOINT REPLACEMENT    . MYOCARDIAL PERFUSION STUDY  12/19/09   NORMAL PATTERN OF PERFUSION IN ALL REGIONS.EF 75%.  . OOPHORECTOMY    . TOTAL KNEE ARTHROPLASTY  1999  . TRANSTHORACIC ECHOCARDIOGRAM  09/11/11   LVEF >55%.STAGE 1 DIASTOLIC DYSFUNCTION,ELEVATED LV FILLING PRESSURE.BIOPROSTHETIC AORTIC VALVE-PEAK AND MEAN GRADIENTS OF 17 MMHG AND 8 MMHG.SIGMOID SEPTUM.MILD TO MOD MR.MILD TO MOD TR.RVSP 46 MMHG.    Family History  Problem Relation Age of Onset  . Heart attack Father   . Coronary artery disease Son 48       deceased  . Hypertension Brother   . Stroke Brother   . Colon cancer Neg Hx   . Breast cancer Neg Hx   . Thyroid disease Neg Hx     Allergies  Allergen Reactions  . Hydrocodone Itching and Nausea Only  . Tramadol Hcl Itching and Nausea Only    Current Outpatient Medications on File Prior to Visit  Medication Sig Dispense Refill  . acetaminophen (TYLENOL) 500 MG tablet Take 1,000 mg by mouth every 6 (six) hours as needed for moderate pain (pain).    Marland Kitchen albuterol (ACCUNEB) 0.63 MG/3ML nebulizer solution Take 3 mLs (0.63 mg total) by nebulization every 6 (six) hours as needed for wheezing. 75 mL 3  . ALPRAZolam (XANAX) 0.25 MG tablet TAKE 1/2 TABLET OR TAKE 1 TABLET BY MOUTH AT NIGHT AS NEEDED FOR INSOMINIA 30 tablet 0  . apixaban (ELIQUIS) 5 MG TABS tablet Take 1 tablet (5 mg total) by mouth 2 (two) times daily. 60 tablet 6  . atorvastatin (LIPITOR) 40 MG tablet Take 1 tablet (40 mg total) by mouth at bedtime. 90 tablet 2  . benazepril (LOTENSIN) 40 MG tablet Take 1 tablet (40 mg total) by mouth daily. 90 tablet 1  . budesonide (PULMICORT) 0.5 MG/2ML nebulizer solution Take 2 mLs (0.5 mg total) by  nebulization 2 (two) times daily. 120 mL 5  . Calcium Carbonate-Vitamin D (CALCIUM 500 + D) 500-125 MG-UNIT TABS Take 1 tablet by mouth at bedtime.    . cholecalciferol (VITAMIN D) 1000 UNITS tablet Take 1,000 Units by mouth at bedtime.     Marland Kitchen  esomeprazole (NEXIUM) 40 MG capsule Take 1 capsule (40 mg total) by mouth daily as needed. 90 capsule 3  . furosemide (LASIX) 20 MG tablet TAKE 2 TABLETS BY MOUTH EVERY DAY 180 tablet 1  . isosorbide mononitrate (IMDUR) 30 MG 24 hr tablet TAKE 1 TABLET BY MOUTH EVERY DAY 90 tablet 1  . ketoconazole (NIZORAL) 2 % cream Apply 1 application topically daily. 30 g 0  . magnesium oxide (MAG-OX) 400 MG tablet TAKE 1 TABLET BY MOUTH EVERY DAY 90 tablet 1  . methimazole (TAPAZOLE) 5 MG tablet TAKE 1 TABLET BY MOUTH 3 TIMES A WEEK 40 tablet 3  . metoprolol tartrate (LOPRESSOR) 25 MG tablet Take 1 tablet (25 mg total) by mouth 2 (two) times daily. 180 tablet 1  . nitroGLYCERIN (NITROSTAT) 0.4 MG SL tablet Place 1 tablet (0.4 mg total) under the tongue every 5 (five) minutes x 3 doses as needed for chest pain. 25 tablet 3  . potassium chloride (KLOR-CON M10) 10 MEQ tablet Take 1 tablet (10 mEq total) by mouth daily. 90 tablet 1  . prednisoLONE acetate (PRED FORTE) 1 % ophthalmic suspension 1 DROP IN SURGICAL EYE FOUR TIMES A DAY AFTER SURGERY  2   No current facility-administered medications on file prior to visit.     BP 140/70   Pulse 96   Temp 97.9 F (36.6 C) (Oral)   Resp 16   Ht 5\' 2"  (1.575 m)   Wt 170 lb 9.6 oz (77.4 kg)   SpO2 99%   BMI 31.20 kg/m       Objective:   Physical Exam  General  Mental Status - Alert. General Appearance - Well groomed. Not in acute distress.  Skin Rashes- No Rashes.  HEENT Head- Normal. Ear Auditory Canal - Left- Normal. Right - Normal.Tympanic Membrane- Left- Normal. Right- Normal. Eye Sclera/Conjunctiva- Left- Normal. Right- Normal. Nose & Sinuses Nasal Mucosa- Left-  Boggy and Congested. Right-  Boggy and   Congested.Bilateral  No maxillary and no  frontal sinus pressure. Mouth & Throat Lips: Upper Lip- Normal: no dryness, cracking, pallor, cyanosis, or vesicular eruption. Lower Lip-Normal: no dryness, cracking, pallor, cyanosis or vesicular eruption. Buccal Mucosa- Bilateral- No Aphthous ulcers. Oropharynx- No Discharge or Erythema. Tonsils: Characteristics- Bilateral- No Erythema or Congestion. Size/Enlargement- Bilateral- No enlargement. Discharge- bilateral-None.  Neck Neck- Supple. No Masses.   Chest and Lung Exam Auscultation: Breath Sounds: even and unlabored.but shallow with scattered expiratory wheeze.  Cardiovascular Auscultation:Rythm- Regular, irregular. Murmurs & Other Heart Sounds:Ausculatation of the heart reveal- No Murmurs.  Lymphatic Head & Neck General Head & Neck Lymphatics: Bilateral: Description- No Localized lymphadenopathy.       Assessment & Plan:  Daughter cell phone [phone]  You appear to have bronchitis. Rest hydrate and tylenol for fever. I am prescribing cough medicine benzonatate, and azithromycin antibiotic. For your nasal congestion can use flonase.  For wheezing use pulmicort two time daily and albuterol up to every 8 hours if needed. Start 6 prednisone taper dose today.  Please get chest x-ray today and a CBC.  If chest x-ray shows pneumonia then would likely add Augmentin antibiotic in addition to continuing azithromycin.  If bilateral pneumonia shows or significantly high WBC count then might recommend ED evaluation for possible hospitalization.  With your age will proceed with caution and if signs symptoms worsen would also recommend ED evaluation.  Follow up in 7 days or as needed  Did give pt glucometer to check sugar daily while on prednisone. If sugars  spike over 200 let us know. FS today 110.

## 2018-04-01 ENCOUNTER — Emergency Department (HOSPITAL_COMMUNITY): Payer: Medicare Other

## 2018-04-01 ENCOUNTER — Other Ambulatory Visit: Payer: Self-pay

## 2018-04-01 ENCOUNTER — Emergency Department (HOSPITAL_COMMUNITY)
Admission: EM | Admit: 2018-04-01 | Discharge: 2018-04-02 | Disposition: A | Discharge status: Home / Self Care | Payer: Medicare Other | Attending: Emergency Medicine | Admitting: Emergency Medicine

## 2018-04-01 ENCOUNTER — Other Ambulatory Visit: Payer: Self-pay | Admitting: Internal Medicine

## 2018-04-01 DIAGNOSIS — I110 Hypertensive heart disease with heart failure: Secondary | ICD-10-CM | POA: Insufficient documentation

## 2018-04-01 DIAGNOSIS — Z87891 Personal history of nicotine dependence: Secondary | ICD-10-CM | POA: Insufficient documentation

## 2018-04-01 DIAGNOSIS — E119 Type 2 diabetes mellitus without complications: Secondary | ICD-10-CM | POA: Diagnosis not present

## 2018-04-01 DIAGNOSIS — R05 Cough: Secondary | ICD-10-CM | POA: Diagnosis not present

## 2018-04-01 DIAGNOSIS — Z79899 Other long term (current) drug therapy: Secondary | ICD-10-CM | POA: Insufficient documentation

## 2018-04-01 DIAGNOSIS — J040 Acute laryngitis: Secondary | ICD-10-CM | POA: Diagnosis not present

## 2018-04-01 DIAGNOSIS — I10 Essential (primary) hypertension: Secondary | ICD-10-CM | POA: Diagnosis not present

## 2018-04-01 DIAGNOSIS — R000 Tachycardia, unspecified: Secondary | ICD-10-CM | POA: Diagnosis not present

## 2018-04-01 DIAGNOSIS — F0390 Unspecified dementia without behavioral disturbance: Secondary | ICD-10-CM | POA: Diagnosis not present

## 2018-04-01 DIAGNOSIS — I5032 Chronic diastolic (congestive) heart failure: Secondary | ICD-10-CM | POA: Insufficient documentation

## 2018-04-01 DIAGNOSIS — R221 Localized swelling, mass and lump, neck: Secondary | ICD-10-CM | POA: Diagnosis not present

## 2018-04-01 DIAGNOSIS — M542 Cervicalgia: Secondary | ICD-10-CM | POA: Diagnosis present

## 2018-04-01 DIAGNOSIS — E039 Hypothyroidism, unspecified: Secondary | ICD-10-CM | POA: Insufficient documentation

## 2018-04-01 DIAGNOSIS — I491 Atrial premature depolarization: Secondary | ICD-10-CM | POA: Diagnosis not present

## 2018-04-01 DIAGNOSIS — I4891 Unspecified atrial fibrillation: Secondary | ICD-10-CM | POA: Diagnosis not present

## 2018-04-01 DIAGNOSIS — Z743 Need for continuous supervision: Secondary | ICD-10-CM | POA: Diagnosis not present

## 2018-04-01 LAB — CBC WITH DIFFERENTIAL/PLATELET
ABS IMMATURE GRANULOCYTES: 0.14 10*3/uL — AB (ref 0.00–0.07)
BASOS PCT: 0 %
Basophils Absolute: 0 10*3/uL (ref 0.0–0.1)
Eosinophils Absolute: 0.1 10*3/uL (ref 0.0–0.5)
Eosinophils Relative: 1 %
HCT: 42.7 % (ref 36.0–46.0)
Hemoglobin: 13.2 g/dL (ref 12.0–15.0)
Immature Granulocytes: 1 %
Lymphocytes Relative: 17 %
Lymphs Abs: 1.9 10*3/uL (ref 0.7–4.0)
MCH: 31 pg (ref 26.0–34.0)
MCHC: 30.9 g/dL (ref 30.0–36.0)
MCV: 100.2 fL — AB (ref 80.0–100.0)
MONO ABS: 1.1 10*3/uL — AB (ref 0.1–1.0)
MONOS PCT: 9 %
NEUTROS ABS: 8.3 10*3/uL — AB (ref 1.7–7.7)
Neutrophils Relative %: 72 %
PLATELETS: 212 10*3/uL (ref 150–400)
RBC: 4.26 MIL/uL (ref 3.87–5.11)
RDW: 12.8 % (ref 11.5–15.5)
WBC: 11.6 10*3/uL — AB (ref 4.0–10.5)
nRBC: 0 % (ref 0.0–0.2)

## 2018-04-01 LAB — BASIC METABOLIC PANEL
Anion gap: 6 (ref 5–15)
BUN: 12 mg/dL (ref 8–23)
CALCIUM: 8.9 mg/dL (ref 8.9–10.3)
CO2: 31 mmol/L (ref 22–32)
Chloride: 103 mmol/L (ref 98–111)
Creatinine, Ser: 0.92 mg/dL (ref 0.44–1.00)
GFR calc Af Amer: >60 mL/min (ref >60)
GFR, EST NON AFRICAN AMERICAN: 52 mL/min — AB (ref >60)
GLUCOSE: 135 mg/dL — AB (ref 70–99)
POTASSIUM: 3.9 mmol/L (ref 3.5–5.1)
SODIUM: 140 mmol/L (ref 135–145)

## 2018-04-01 LAB — I-STAT TROPONIN, ED: TROPONIN I, POC: 0.01 ng/mL (ref 0.00–0.08)

## 2018-04-01 MED ORDER — IOHEXOL 300 MG/ML  SOLN
75.0000 mL | Freq: Once | INTRAMUSCULAR | Status: AC | PRN
  Administered 2018-04-01: 75 mL via INTRAVENOUS

## 2018-04-01 MED ORDER — SODIUM CHLORIDE 0.9 % IV BOLUS
500.0000 mL | Freq: Once | INTRAVENOUS | Status: AC
  Administered 2018-04-01: 500 mL via INTRAVENOUS

## 2018-04-01 NOTE — ED Notes (Signed)
ED Provider at bedside. 

## 2018-04-01 NOTE — ED Provider Notes (Signed)
[facility] EMERGENCY DEPARTMENT Provider Note   CSN: [id number] Arrival date & time: 04/01/18  2122     History   Chief Complaint No chief complaint on file.   HPI Rebecca Skinner is a 84 y.o. female.  HPI  84 year old female presents with anterior neck pain and change in voice.  The patient has been having bronchitis for several days including cough and sore throat.  However tonight it seemed like she all of a sudden had an increased pitch in her voice and was complaining of some anterior neck pain.  Some sore throat as well.  No headache or focal weakness/numbness.  No facial droop.  She denies any chest pain.  She states she is "not sure" when asked about shortness of breath. Pain is rated as a 2.  Past Medical History:  Diagnosis Date  . Anxiety 11/20/2011  . Asthma    UNDER THE CARE [facility] DR PAZ  . CAD (coronary artery disease)    s/p CABG s/p AO valve replacement ([place] [facility])  . CVA (cerebral infarction) 08-2013   multiple, L, d/t Afib, started eloquis  . Diabetes mellitus    type 2   . Dizziness    Chronic, admiet 07-2010,saw neuro, thought to be a peripheral issue   . Dry skin   . GERD (gastroesophageal reflux disease)   . Hyperlipidemia   . Hypertension   . Hyperthyroidism   . Keloid    @ chest  . Memory loss   . Osteoarthritis   . Osteopenia    per dexa 12/09  . Paroxysmal atrial fibrillation ([place])    cards d/c coumadin 12/2008 d/t persisten NSR and frequent falls- restarted coumadin july 2011, now on Eliquis  . Recurrent UTI   . Shortness of breath dyspnea   . TIA (transient ischemic attack)     Patient Active Problem List   Diagnosis Date Noted  . Chest pain 06/21/2017  . TIA (transient ischemic attack) 08/30/2016  . Acute kidney injury ([place]) 03/05/2016  . Exposure keratitis 09/23/2015  . Keratopathy, band 09/13/2015  . PCP NOTES >>>>> 04/05/2015  . Chronic diastolic heart failure ([place]) 12/29/2014  . Edema 12/20/2014  . Dyspnea  12/20/2014  . Angiectasia 03/30/2014  . GI bleed 03/28/2014  . Dementia ([place]) 09/16/2013  . Annual physical exam 11/21/2011  . ATRIAL FIBRILLATION, chronic 03/05/2007  . Diabetes mellitus with circulatory complication ([place]) 21/19/4174  . S/P AVR (aortic valve replacement) 10/15/2006  . Hyperthyroidism 09/03/2006  . Dyslipidemia 09/03/2006  . HTN (hypertension) 09/03/2006  . CAD (coronary artery disease) of artery bypass graft 09/03/2006  . Asthma, chronic 09/03/2006    Past Surgical History:  Procedure Laterality Date  . ABDOMINAL HYSTERECTOMY    . AORTIC VALVE REPLACEMENT  1998  . CARDIAC VALVE REPLACEMENT    . CAROTID DOPPLER  07/13/12   BILATERAL BULB/PROXIMAL ICAS;MILD AMOUNT OF FIBROUS PLAQUE WITH NO DIAMETER REDUCTION.  . CESAREAN SECTION     x 2  . COLONOSCOPY WITH PROPOFOL N/A 03/30/2014   Procedure: COLONOSCOPY WITH PROPOFOL;  Surgeon: Irene Shipper, MD;  Location: [facility];  Service: Endoscopy;  Laterality: N/A;  . CORONARY ARTERY BYPASS GRAFT  1998   SVG TO RCA  . HEMORRHOID SURGERY    . JOINT REPLACEMENT    . MYOCARDIAL PERFUSION STUDY  12/19/09   NORMAL PATTERN OF PERFUSION IN ALL REGIONS.EF 75%.  . OOPHORECTOMY    . TOTAL KNEE ARTHROPLASTY  1999  . TRANSTHORACIC ECHOCARDIOGRAM  09/11/11   LVEF >55%.STAGE 1 DIASTOLIC DYSFUNCTION,ELEVATED LV FILLING PRESSURE.BIOPROSTHETIC AORTIC VALVE-PEAK AND MEAN GRADIENTS OF 17 MMHG AND 8 MMHG.SIGMOID SEPTUM.MILD TO MOD MR.MILD TO MOD TR.RVSP 46 MMHG.     OB History    Gravida  4   Para  4   Term      Preterm      AB      Living        SAB      TAB      Ectopic      Multiple      Live Births               Home Medications    Prior to Admission medications   Medication Sig Start Date End Date Taking? Authorizing Provider  acetaminophen (TYLENOL) 500 MG tablet Take 1,000 mg by mouth every 6 (six) hours as needed for moderate pain (pain).    [provider]  albuterol (ACCUNEB) 0.63 MG/3ML  nebulizer solution Take 3 mLs (0.63 mg total) by nebulization every 6 (six) hours as needed for wheezing. 11/25/17   Colon Branch, MD  ALPRAZolam Duanne Moron) 0.25 MG tablet TAKE 1/2 TABLET OR TAKE 1 TABLET BY MOUTH AT NIGHT AS NEEDED FOR INSOMINIA 07/21/17   Colon Branch, MD  apixaban (ELIQUIS) 5 MG TABS tablet Take 1 tablet (5 mg total) by mouth 2 (two) times daily. 04/01/18   Colon Branch, MD  atorvastatin (LIPITOR) 40 MG tablet Take 1 tablet (40 mg total) by mouth at bedtime. 10/06/17   Colon Branch, MD  azithromycin (ZITHROMAX) 250 MG tablet Take 2 tablets by mouth on day 1, followed by 1 tablet by mouth daily for 4 days. 03/26/18   Saguier, Percell Miller, PA-C  benazepril (LOTENSIN) 40 MG tablet Take 1 tablet (40 mg total) by mouth daily. 10/23/17   Colon Branch, MD  benzonatate (TESSALON) 100 MG capsule Take 1 capsule (100 mg total) by mouth 3 (three) times daily as needed for cough. 03/26/18   Saguier, Percell Miller, PA-C  budesonide (PULMICORT) 0.5 MG/2ML nebulizer solution Take 2 mLs (0.5 mg total) by nebulization 2 (two) times daily. 11/25/17   Colon Branch, MD  Calcium Carbonate-Vitamin D (CALCIUM 500 + D) 500-125 MG-UNIT TABS Take 1 tablet by mouth at bedtime.    [provider]  cholecalciferol (VITAMIN D) 1000 UNITS tablet Take 1,000 Units by mouth at bedtime.     [provider]  esomeprazole (NEXIUM) 40 MG capsule Take 1 capsule (40 mg total) by mouth daily as needed. 01/19/18   Colon Branch, MD  furosemide (LASIX) 20 MG tablet TAKE 2 TABLETS BY MOUTH EVERY DAY 02/04/18   Colon Branch, MD  isosorbide mononitrate (IMDUR) 30 MG 24 hr tablet TAKE 1 TABLET BY MOUTH EVERY DAY 01/05/18   Lyda Jester M, PA-C  ketoconazole (NIZORAL) 2 % cream Apply 1 application topically daily. 09/24/17   Colon Branch, MD  magnesium oxide (MAG-OX) 400 MG tablet TAKE 1 TABLET BY MOUTH EVERY DAY 02/20/18   Colon Branch, MD  methimazole (TAPAZOLE) 5 MG tablet TAKE 1 TABLET BY MOUTH 3 TIMES A WEEK 09/03/17   Colon Branch, MD    metoprolol tartrate (LOPRESSOR) 25 MG tablet Take 1 tablet (25 mg total) by mouth 2 (two) times daily. 05/29/17   Colon Branch, MD  nitroGLYCERIN (NITROSTAT) 0.4 MG SL tablet Place 1 tablet (0.4 mg total) under the tongue every 5 (five) minutes x 3  doses as needed for chest pain. 12/13/16   Colon Branch, MD  potassium chloride (KLOR-CON M10) 10 MEQ tablet Take 1 tablet (10 mEq total) by mouth daily. 01/22/18   Colon Branch, MD  prednisoLONE acetate (PRED FORTE) 1 % ophthalmic suspension 1 DROP IN SURGICAL EYE FOUR TIMES A DAY AFTER SURGERY 07/21/17   [provider]  predniSONE (DELTASONE) 10 MG tablet 6 TAB PO DAY 1 5 TAB PO DAY 2 4 TAB PO DAY 3 3 TAB PO DAY 4 2 TAB PO DAY 5 1 TAB PO DAY 6 03/26/18   Saguier, Percell Miller, PA-C    Family History Family History  Problem Relation Age of Onset  . Heart attack Father   . Coronary artery disease Son 73       deceased  . Hypertension Brother   . Stroke Brother   . Colon cancer Neg Hx   . Breast cancer Neg Hx   . Thyroid disease Neg Hx     Social History Social History   Tobacco Use  . Smoking status: Former Research scientist (life sciences)  . Smokeless tobacco: Never Used  . Tobacco comment: quit 2004, smoked 1.5 ppd  Substance Use Topics  . Alcohol use: No    Alcohol/week: 0.0 standard drinks  . Drug use: No     Allergies   Hydrocodone and Tramadol hcl   Review of Systems Review of Systems  HENT: Positive for sore throat and voice change.   Respiratory: Positive for cough. Negative for shortness of breath.   Cardiovascular: Negative for chest pain.  Gastrointestinal: Negative for vomiting.  Musculoskeletal: Positive for neck pain. Negative for neck stiffness.  Neurological: Negative for weakness, numbness and headaches.  All other systems reviewed and are negative.    Physical Exam Updated Vital Signs BP (!) 222/102   Pulse 93   Temp 98 F (36.7 C) (Oral)   Resp (!) 23   Ht 5\' 2"  (1.575 m)   SpO2 99%   BMI 31.20 kg/m   Physical  Exam  Constitutional: She is oriented to person, place, and time. She appears well-developed and well-nourished. No distress.  HENT:  Head: Normocephalic and atraumatic.  Right Ear: External ear normal.  Left Ear: External ear normal.  Nose: Nose normal.  Normal sinus tongue and lips.  Difficult to visualize the posterior oropharynx but there does not appear to be an obvious abscess or swelling She does not have stridor but she does appear to have a high-pitched voice.  Eyes: Right eye exhibits no discharge. Left eye exhibits no discharge.  Neck: Normal range of motion. Neck supple.  Mild anterior tenderness. Hard to localize. No swelling  Cardiovascular: Normal rate, regular rhythm and normal heart sounds.  Pulmonary/Chest: Effort normal. No stridor. She has wheezes (scattered, mild).  Abdominal: Soft. There is no tenderness.  Neurological: She is alert and oriented to person, place, and time.  CN 3-12 grossly intact. 5/5 strength in all 4 extremities. Grossly normal sensation.   Skin: Skin is warm and dry. She is not diaphoretic.  Nursing note and vitals reviewed.    ED Treatments / Results  Labs (all labs ordered are listed, but only abnormal results are displayed) Labs Reviewed  BASIC METABOLIC PANEL - Abnormal; Notable for the following components:      Result Value   Glucose, Bld 135 (*)    GFR calc non Af Amer 52 (*)    All other components within normal limits  CBC WITH DIFFERENTIAL/PLATELET - Abnormal; Notable  for the following components:   WBC 11.6 (*)    MCV 100.2 (*)    Neutro Abs 8.3 (*)    Monocytes Absolute 1.1 (*)    Abs Immature Granulocytes 0.14 (*)    All other components within normal limits  I-STAT TROPONIN, ED    EKG EKG Interpretation  Date/Time:  Wednesday April 01 2018 22:02:08 EDT Ventricular Rate:  92 PR Interval:    QRS Duration: 87 QT Interval:  367 QTC Calculation: 790 R Axis:   58 Text Interpretation:  Atrial fibrillation  Ventricular premature complex Confirmed by Sherwood Gambler [phone]) on 04/01/2018 10:45:48 PM   Radiology Dg Chest 2 View  Result Date: 04/01/2018 CLINICAL DATA:  Cough.  Slurred speech. EXAM: CHEST - 2 VIEW COMPARISON:  Radiographs 03/26/2018.  Additional prior exams. FINDINGS: Patient is post median sternotomy with prosthetic aortic valve. Unchanged cardiomegaly and mediastinal contours. Aortic atherosclerosis. No pulmonary edema, focal airspace disease, pleural effusion or pneumothorax. No acute osseous abnormalities. IMPRESSION: 1. Unchanged cardiomegaly without congestive failure or acute pulmonary process. 2.  Aortic Atherosclerosis (ICD10-I70.0). Electronically Signed   By: Keith Rake M.D.   On: 04/01/2018 22:49    Procedures Procedures (including critical care time)  Medications Ordered in ED Medications  sodium chloride 0.9 % bolus 500 mL (500 mLs Intravenous New Bag/Given 04/01/18 2215)  iohexol (OMNIPAQUE) 300 MG/ML solution 75 mL (75 mLs Intravenous Contrast Given 04/01/18 2339)     Initial Impression / Assessment and Plan / ED Course  I have reviewed the triage vital signs and the nursing notes.  Pertinent labs & imaging results that were available during my care of the patient were reviewed by me and considered in my medical decision making (see chart for details).     Patient's symptoms could be from sore throat/neck pathology vs some dehydration. I highly doubt stroke. She is feeling better after IV fluids. However, with the change in voice (though not stridor), and neck pain, I think CT neck is needed to rule out deep space infection. Care transferred to Dr. Betsey Holiday with this pending.   Final Clinical Impressions(s) / ED Diagnoses   Final diagnoses:  None    ED Discharge Orders    None       Sherwood Gambler, MD 04/01/18 2353

## 2018-04-01 NOTE — ED Notes (Signed)
Patient transported to CT 

## 2018-04-01 NOTE — ED Triage Notes (Addendum)
Per [facility], pt coming from home where family reported speech changes at 2030, denied slurred speech but stated voice was hoarse and it is intermittent. Pt has recent dx of bronchitis and is on antibiotics. Pt c/o sore throat with EMS. Pt has hx of TIA and dementia. Pt hypertensive with EMS at 195/135. EDP notified of pt arrival.

## 2018-04-02 ENCOUNTER — Ambulatory Visit: Payer: Medicare Other | Admitting: Internal Medicine

## 2018-04-02 MED ORDER — LABETALOL HCL 5 MG/ML IV SOLN
20.0000 mg | Freq: Once | INTRAVENOUS | Status: AC
  Administered 2018-04-02: 20 mg via INTRAVENOUS
  Filled 2018-04-02: qty 4

## 2018-04-06 ENCOUNTER — Ambulatory Visit (INDEPENDENT_AMBULATORY_CARE_PROVIDER_SITE_OTHER): Payer: Medicare Other | Admitting: Internal Medicine

## 2018-04-06 ENCOUNTER — Encounter: Payer: Self-pay | Admitting: Internal Medicine

## 2018-04-06 VITALS — BP 142/98 | HR 77 | Temp 98.3°F | Resp 16 | Ht 62.0 in | Wt 168.5 lb

## 2018-04-06 DIAGNOSIS — E0590 Thyrotoxicosis, unspecified without thyrotoxic crisis or storm: Secondary | ICD-10-CM | POA: Diagnosis not present

## 2018-04-06 DIAGNOSIS — I10 Essential (primary) hypertension: Secondary | ICD-10-CM | POA: Diagnosis not present

## 2018-04-06 DIAGNOSIS — J40 Bronchitis, not specified as acute or chronic: Secondary | ICD-10-CM | POA: Diagnosis not present

## 2018-04-06 DIAGNOSIS — E1159 Type 2 diabetes mellitus with other circulatory complications: Secondary | ICD-10-CM | POA: Diagnosis not present

## 2018-04-06 DIAGNOSIS — Z23 Encounter for immunization: Secondary | ICD-10-CM | POA: Diagnosis not present

## 2018-04-06 LAB — AST: AST: 14 U/L (ref 0–37)

## 2018-04-06 LAB — T4, FREE: Free T4: 0.77 ng/dL (ref 0.60–1.60)

## 2018-04-06 LAB — ALT: ALT: 13 U/L (ref 0–35)

## 2018-04-06 LAB — HEMOGLOBIN A1C: HEMOGLOBIN A1C: 7.4 % — AB (ref 4.6–6.5)

## 2018-04-06 LAB — TSH: TSH: 1.82 u[IU]/mL (ref 0.35–4.50)

## 2018-04-06 NOTE — Progress Notes (Signed)
Pre visit review using our clinic review tool, if applicable. No additional management support is needed unless otherwise documented below in the visit note. 

## 2018-04-06 NOTE — Patient Instructions (Signed)
GO TO THE LAB : Get the blood work     GO TO THE FRONT DESK Schedule your next appointment for a  Check up in 6 months    Check the  blood pressure 2 or 3 times a  week   Be sure your blood pressure is between 110/65 and  135/85. If it is consistently higher or lower, let me know

## 2018-04-06 NOTE — Progress Notes (Signed)
Subjective:    Patient ID: Rebecca Skinner, female    DOB: 1922-06-15, 84 y.o.   MRN: [id number]  DOS:  04/06/2018 Type of visit - description : ED f/u, here with her daughter Interval history:  Went to the ER 04/01/2018. At the time she had bronchitis, anterior neck pain, dysphonia. CT neck with contrast show no acute process, CBC show white count of 11.6, BMP satisfactory.  Troponin negative. BP was 222/102, got labetalol  Since she left the hospital she is doing better. Neck pain and hoarseness have decreased. Ambulatory BP in the 140/90.  Review of Systems Denies fever or chills No chest pain no difficulty breathing Cough has decreased. Memory is about the same according to the daughter who is here with her.   Past Medical History:  Diagnosis Date  . Anxiety 11/20/2011  . Asthma    [facility] DR Akeel Reffner  . CAD (coronary artery disease)    s/p CABG s/p AO valve replacement ([place] [facility])  . CVA (cerebral infarction) 08-2013   multiple, L, d/t Afib, started eloquis  . Diabetes mellitus    type 2   . Dizziness    Chronic, admiet 07-2010,saw neuro, thought to be a peripheral issue   . Dry skin   . GERD (gastroesophageal reflux disease)   . Hyperlipidemia   . Hypertension   . Hyperthyroidism   . Keloid    @ chest  . Memory loss   . Osteoarthritis   . Osteopenia    per dexa 12/09  . Paroxysmal atrial fibrillation ([place])    cards d/c coumadin 12/2008 d/t persisten NSR and frequent falls- restarted coumadin july 2011, now on Eliquis  . Recurrent UTI   . Shortness of breath dyspnea   . TIA (transient ischemic attack)     Past Surgical History:  Procedure Laterality Date  . ABDOMINAL HYSTERECTOMY    . AORTIC VALVE REPLACEMENT  1998  . CARDIAC VALVE REPLACEMENT    . CAROTID DOPPLER  07/13/12   BILATERAL BULB/PROXIMAL ICAS;MILD AMOUNT OF FIBROUS PLAQUE WITH NO DIAMETER REDUCTION.  . CESAREAN SECTION     x 2  . COLONOSCOPY WITH PROPOFOL N/A 03/30/2014   Procedure: COLONOSCOPY WITH PROPOFOL;  Surgeon: Irene Shipper, MD;  Location: [facility];  Service: Endoscopy;  Laterality: N/A;  . CORONARY ARTERY BYPASS GRAFT  1998   SVG TO RCA  . HEMORRHOID SURGERY    . JOINT REPLACEMENT    . MYOCARDIAL PERFUSION STUDY  12/19/09   NORMAL PATTERN OF PERFUSION IN ALL REGIONS.EF 75%.  . OOPHORECTOMY    . TOTAL KNEE ARTHROPLASTY  1999  . TRANSTHORACIC ECHOCARDIOGRAM  09/11/11   LVEF >55%.STAGE 1 DIASTOLIC DYSFUNCTION,ELEVATED LV FILLING PRESSURE.BIOPROSTHETIC AORTIC VALVE-PEAK AND MEAN GRADIENTS OF 17 MMHG AND 8 MMHG.SIGMOID SEPTUM.MILD TO MOD MR.MILD TO MOD TR.RVSP 46 MMHG.    Social History   Socioeconomic History  . Marital status: Divorced    Spouse name: Not on file  . Number of children: 4  . Years of education: Not on file  . Highest education level: Not on file  Occupational History  . Occupation: retired     Fish farm manager: RETIRED  Social Needs  . Financial resource strain: Not on file  . Food insecurity:    Worry: Not on file    Inability: Not on file  . Transportation needs:    Medical: Not on file    Non-medical: Not on file  Tobacco Use  . Smoking status: Former  Smoker  . Smokeless tobacco: Never Used  . Tobacco comment: quit 2004, smoked 1.5 ppd  Substance and Sexual Activity  . Alcohol use: No    Alcohol/week: 0.0 standard drinks  . Drug use: No  . Sexual activity: Not Currently  Lifestyle  . Physical activity:    Days per week: Not on file    Minutes per session: Not on file  . Stress: Not on file  Relationships  . Social connections:    Talks on phone: Not on file    Gets together: Not on file    Attends religious service: Not on file    Active member of club or organization: Not on file    Attends meetings of clubs or organizations: Not on file    Relationship status: Not on file  . Intimate partner violence:    Fear of current or ex partner: Not on file    Emotionally abused: Not on file    Physically abused: Not  on file    Forced sexual activity: Not on file  Other Topics Concern  . Not on file  Social History Narrative   Had to moved w/ [name]  ~ 01-2016   Had 3 daughter- 2 son  (lost oldest daughter and son), 2 living daughters in [place] as of 04/06/2018      Reactions   Hydrocodone Itching, Nausea Only   Tramadol Hcl Itching, Nausea Only      Medication List        Accurate as of 04/06/18  8:36 PM. Always use your most recent med list.          acetaminophen 500 MG tablet Commonly known as:  TYLENOL Take 1,000 mg by mouth every 6 (six) hours as needed for moderate pain (pain).   albuterol 0.63 MG/3ML nebulizer solution Commonly known as:  ACCUNEB Take 3 mLs (0.63 mg total) by nebulization every 6 (six) hours as needed for wheezing.   ALPRAZolam 0.25 MG tablet Commonly known as:  XANAX TAKE 1/2 TABLET OR TAKE 1 TABLET BY MOUTH AT NIGHT AS NEEDED FOR INSOMINIA   apixaban 5 MG Tabs tablet Commonly known as:  ELIQUIS Take 1 tablet (5 mg total) by mouth 2 (two) times daily.   atorvastatin 40 MG tablet Commonly known as:  LIPITOR Take 1 tablet (40 mg total) by mouth at bedtime.   benazepril 40 MG tablet Commonly known as:  LOTENSIN Take 1 tablet (40 mg total) by mouth daily.   budesonide 0.5 MG/2ML nebulizer solution Commonly known as:  PULMICORT Take 2 mLs (0.5 mg total) by nebulization 2 (two) times daily.   CALCIUM 500 + D 500-125 MG-UNIT Tabs Generic drug:  Calcium Carbonate-Vitamin D Take 1 tablet by mouth at bedtime.   cholecalciferol 1000 units tablet Commonly known as:  VITAMIN D Take 1,000 Units by mouth at bedtime.   esomeprazole 40 MG capsule Commonly known as:  NEXIUM Take 1 capsule (40 mg total) by mouth daily as needed.   furosemide 20 MG tablet Commonly known as:  LASIX TAKE 2 TABLETS BY MOUTH EVERY DAY   isosorbide mononitrate 30 MG 24 hr tablet Commonly known as:  IMDUR TAKE 1 TABLET BY MOUTH EVERY DAY   ketoconazole 2 %  cream Commonly known as:  NIZORAL Apply 1 application topically daily.   magnesium oxide 400 MG tablet Commonly known as:  MAG-OX TAKE 1 TABLET BY MOUTH EVERY DAY   methimazole 5 MG tablet Commonly known  as:  TAPAZOLE TAKE 1 TABLET BY MOUTH 3 TIMES A WEEK   metoprolol tartrate 25 MG tablet Commonly known as:  LOPRESSOR Take 1 tablet (25 mg total) by mouth 2 (two) times daily.   nitroGLYCERIN 0.4 MG SL tablet Commonly known as:  NITROSTAT Place 1 tablet (0.4 mg total) under the tongue every 5 (five) minutes x 3 doses as needed for chest pain.   potassium chloride 10 MEQ tablet Commonly known as:  K-DUR,KLOR-CON Take 1 tablet (10 mEq total) by mouth daily.   prednisoLONE acetate 1 % ophthalmic suspension Commonly known as:  PRED FORTE 1 DROP IN SURGICAL EYE FOUR TIMES A DAY AFTER SURGERY          Objective:   Physical Exam BP (!) 142/98 (BP Location: Left Arm, Patient Position: Sitting, Cuff Size: Normal)   Pulse 77   Temp 98.3 F (36.8 C) (Oral)   Resp 16   Ht 5\' 2"  (1.575 m)   Wt 168 lb 8 oz (76.4 kg)   SpO2 93%   BMI 30.82 kg/m  General:   Well developed, NAD, see BMI.  HEENT:  Normocephalic . Face symmetric, atraumatic Lungs:  CTA B, very few rhonchi noted. Normal respiratory effort, no intercostal retractions, no accessory muscle use. Heart: irreg  No pretibial edema bilaterally  Skin: Not pale. Not jaundice Neurologic:  alert & oriented to self, not oriented on time.  Couldn't recall her last 2 meals.Marland Kitchen  Speech normal, gait  Assisted  By a rollingwaker Psych--  Behavior appropriate. No anxious or depressed appearing.      Assessment & Plan:  Assessment DM HTN Hyperlipidemia Hyperthyroidism -- used to see Dr. Loanne Drilling, last visit 01-2016, now f/u PCP, check labs x 2 q year GERD Asthma  Anxiety on xanax  CV: Dr. Claiborne Billings --CHF, CAD, CABG, Aortic valve replacement --P- atrial fibrillation, used to be on Coumadin, Eliquis rx after the stroke 3-15,  d/c 03-2014 d/t GI bleed , on ASA 81. Dx TIA 08-2016, back on eliquis, asa d/c. ASA restarted after admission w/ CP 06-2017 NEURO --TIAs (admited 08-2016), CVA 2015 --Dementia MMSE ~ 12 to 15  (12-2016) DJD Osteopenia 2009 Recurrent UTIs Daughter  Deepa Barthel; lives w/ [name]  PLAN Laryngitis, bronchitis, asthma: Symptoms are improving, status post Z-Pak.  Doing better. DM: Diet controlled, check A1c HTN : BP quite elevated at the ER, today is 142/98, on home is in the 140s over 90s. For now recommend to continue benazepril, Lasix, Imdur, Lopressor, potassium.  Last BMP satisfactory.  Close monitor BP at home, if not better will adjust her medication.  Daughter agreed with plan and will if needed, see AVS. Hyperthyroidism: On Tapazole, check LFTs, TSH and T4. Social: Lives with daughter, requires help for most of her ADLs. Preventive care: PNM 23 shot today. RTC 6 months

## 2018-04-06 NOTE — Assessment & Plan Note (Addendum)
Laryngitis, bronchitis, asthma: Symptoms are improving, status post Z-Pak.  Doing better. DM: Diet controlled, check A1c HTN : BP quite elevated at the ER, today is 142/98, on home is in the 140s over 90s. For now recommend to continue benazepril, Lasix, Imdur, Lopressor, potassium.  Last BMP satisfactory.  Close monitor BP at home, if not better will adjust her medication.  Daughter agreed with plan and will if needed, see AVS. Hyperthyroidism: On Tapazole, check LFTs, TSH and T4. Social: Lives with daughter, requires help for most of her ADLs. Preventive care: PNM 23 shot today. RTC 6 months

## 2018-04-13 ENCOUNTER — Emergency Department (HOSPITAL_COMMUNITY): Payer: Medicare Other

## 2018-04-13 ENCOUNTER — Ambulatory Visit: Payer: Self-pay

## 2018-04-13 ENCOUNTER — Emergency Department (HOSPITAL_COMMUNITY)
Admission: EM | Admit: 2018-04-13 | Discharge: 2018-04-13 | Disposition: A | Discharge status: Home / Self Care | Payer: Medicare Other | Attending: Emergency Medicine | Admitting: Emergency Medicine

## 2018-04-13 ENCOUNTER — Encounter (HOSPITAL_COMMUNITY): Payer: Self-pay | Admitting: Emergency Medicine

## 2018-04-13 ENCOUNTER — Other Ambulatory Visit: Payer: Self-pay

## 2018-04-13 DIAGNOSIS — I110 Hypertensive heart disease with heart failure: Secondary | ICD-10-CM | POA: Diagnosis not present

## 2018-04-13 DIAGNOSIS — Z79899 Other long term (current) drug therapy: Secondary | ICD-10-CM | POA: Insufficient documentation

## 2018-04-13 DIAGNOSIS — E119 Type 2 diabetes mellitus without complications: Secondary | ICD-10-CM | POA: Insufficient documentation

## 2018-04-13 DIAGNOSIS — J45909 Unspecified asthma, uncomplicated: Secondary | ICD-10-CM | POA: Insufficient documentation

## 2018-04-13 DIAGNOSIS — I5032 Chronic diastolic (congestive) heart failure: Secondary | ICD-10-CM | POA: Diagnosis not present

## 2018-04-13 DIAGNOSIS — I10 Essential (primary) hypertension: Secondary | ICD-10-CM | POA: Diagnosis not present

## 2018-04-13 DIAGNOSIS — I480 Paroxysmal atrial fibrillation: Secondary | ICD-10-CM | POA: Diagnosis not present

## 2018-04-13 DIAGNOSIS — Z87891 Personal history of nicotine dependence: Secondary | ICD-10-CM | POA: Diagnosis not present

## 2018-04-13 DIAGNOSIS — I2510 Atherosclerotic heart disease of native coronary artery without angina pectoris: Secondary | ICD-10-CM | POA: Diagnosis not present

## 2018-04-13 DIAGNOSIS — Z7901 Long term (current) use of anticoagulants: Secondary | ICD-10-CM | POA: Insufficient documentation

## 2018-04-13 DIAGNOSIS — E0590 Thyrotoxicosis, unspecified without thyrotoxic crisis or storm: Secondary | ICD-10-CM | POA: Diagnosis not present

## 2018-04-13 LAB — BASIC METABOLIC PANEL
Anion gap: 8 (ref 5–15)
BUN: 11 mg/dL (ref 8–23)
CO2: 26 mmol/L (ref 22–32)
Calcium: 8.9 mg/dL (ref 8.9–10.3)
Chloride: 107 mmol/L (ref 98–111)
Creatinine, Ser: 0.91 mg/dL (ref 0.44–1.00)
GFR calc non Af Amer: 53 mL/min — ABNORMAL LOW (ref >60)
GLUCOSE: 92 mg/dL (ref 70–99)
POTASSIUM: 3.5 mmol/L (ref 3.5–5.1)
Sodium: 141 mmol/L (ref 135–145)

## 2018-04-13 LAB — CBC
HCT: 42.8 % (ref 36.0–46.0)
HEMOGLOBIN: 12.9 g/dL (ref 12.0–15.0)
MCH: 30.6 pg (ref 26.0–34.0)
MCHC: 30.1 g/dL (ref 30.0–36.0)
MCV: 101.4 fL — AB (ref 80.0–100.0)
NRBC: 0 % (ref 0.0–0.2)
Platelets: 203 10*3/uL (ref 150–400)
RBC: 4.22 MIL/uL (ref 3.87–5.11)
RDW: 13.2 % (ref 11.5–15.5)
WBC: 6.1 10*3/uL (ref 4.0–10.5)

## 2018-04-13 LAB — I-STAT TROPONIN, ED: TROPONIN I, POC: 0.01 ng/mL (ref 0.00–0.08)

## 2018-04-13 MED ORDER — METOPROLOL TARTRATE 37.5 MG PO TABS: 37.5000 mg | ORAL_TABLET | Freq: Two times a day (BID) | ORAL | 0 refills | Status: DC

## 2018-04-13 MED ORDER — ACETAMINOPHEN 325 MG PO TABS
650.0000 mg | ORAL_TABLET | Freq: Once | ORAL | Status: AC
  Administered 2018-04-13: 650 mg via ORAL
  Filled 2018-04-13: qty 2

## 2018-04-13 MED ORDER — METOPROLOL TARTRATE 25 MG PO TABS
37.5000 mg | ORAL_TABLET | Freq: Once | ORAL | Status: AC
  Administered 2018-04-13: 37.5 mg via ORAL
  Filled 2018-04-13: qty 2

## 2018-04-13 MED ORDER — LABETALOL HCL 5 MG/ML IV SOLN
10.0000 mg | Freq: Once | INTRAVENOUS | Status: AC
  Administered 2018-04-13: 10 mg via INTRAVENOUS
  Filled 2018-04-13: qty 4

## 2018-04-13 NOTE — ED Triage Notes (Signed)
Patient brought to ED by her daughter for recurrent hypertension - reports readings at home today have been high (above 200/100) - seen and treated for same last week. Patient has no complaints, denies pain, shortness of breath, dizziness, or vision changes. HR 118 in triage - known A-Fib.

## 2018-04-13 NOTE — ED Provider Notes (Signed)
Patient placed in Quick Look pathway, seen and evaluated   Chief Complaint: elevated BP  HPI: Rebecca Skinner is a 84 y.o. female with hx of CVA, DM, HTN, A-Fib and CAD who present to the ED with her daughter for elevated BP. Patient's daughter reports that the patient was at her PCP a week ago and BP was elevated and they told her to keep a check on it. Today BP was elevated and the PCP office told patient to come to the ED. Patient denies chest pain or shortness of breath.   ROS: CV: elevated BP  Physical Exam:  BP (!) 206/103 (BP Location: Right Arm)   Pulse (!) 118   Temp 98.6 F (37 C)   Resp (!) 22   SpO2 100%    Gen: No distress  Neuro: Awake and Alert  Skin: Warm and dry  Heart: irregular  Lungs: no wheezing or rales heard   Initiation of care has begun. The patient has been counseled on the process, plan, and necessity for staying for the completion/evaluation, and the remainder of the medical screening examination    Ashley Murrain, NP 04/13/18 1721    Julianne Rice, MD 04/15/18 [phone]

## 2018-04-13 NOTE — Telephone Encounter (Signed)
Incoming call from Patient's daughter who is  On the (DPR,) [name].  States she took the Patient B/P it was 200/ 101 second reading was 194/ 103. States she normally runs 140/85 to 90.  Second reading was during the phone encounter.  Was taken with an automatic machine.  Has a history of Hypertension.  Takes her medications have not missed a dose.  Denies Head ache, Chest Pain, Blurred vision, difficulty breathing and weakness. Per protocol, recommended that Daughter take Patient to ER.  Provided care advice.  Daughter voiced understanding. States that she will take the Patient to ED.      Reason for Disposition . [8] Systolic BP  >= 657 OR Diastolic >= 846 AND [9] cardiac or neurologic symptoms (e.g., chest pain, difficulty breathing, unsteady gait, blurred vision)  Answer Assessment - Initial Assessment Questions 1. BLOOD PRESSURE: "What is the blood pressure?" "Did you take at least two measurements 5 minutes apart?"     See note 2. ONSET: "When did you take your blood pressure?"     Just now 3. HOW: "How did you obtain the blood pressure?" (e.g., visiting nurse, automatic home BP monitor)     Automatic machine 4. HISTORY: "Do you have a history of high blood pressure?"     Yes  5. MEDICATIONS: "Are you taking any medications for blood pressure?" "Have you missed any doses recently?"     yes 6. OTHER SYMPTOMS: "Do you have any symptoms?" (e.g., headache, chest pain, blurred vision, difficulty breathing, weakness)     denies 7. PREGNANCY: "Is there any chance you are pregnant?" "When was your last menstrual period?"     na  Protocols used: HIGH BLOOD PRESSURE-A-AH

## 2018-04-13 NOTE — Telephone Encounter (Signed)
Noted, thx, will wait for ED report tomorrow

## 2018-04-13 NOTE — ED Provider Notes (Signed)
[facility]ERGENCY DEPARTMENT Provider Note   CSN: [id number] Arrival date & time: 04/13/18  1634     History   Chief Complaint Chief Complaint  Patient presents with  . Hypertension    HPI Rebecca Skinner is a 84 y.o. female.  HPI Patient presents with elevation in blood pressure.  No recent changes in medication.  No reported missed doses.  Systolic blood pressure has been over 200 today.  Advised by primary physician to come to the emergency department.  Patient denies headache, chest pain or shortness of breath.  She has back pain which she states is chronic and worse from sitting in chair in the waiting room.  Denies any new focal weakness or numbness.  Patient is on Eliquis for known atrial fibrillation. Past Medical History:  Diagnosis Date  . Anxiety 11/20/2011  . Asthma    [facility] DR PAZ  . CAD (coronary artery disease)    s/p CABG s/p AO valve replacement ([place] [facility])  . CVA (cerebral infarction) 08-2013   multiple, L, d/t Afib, started eloquis  . Diabetes mellitus    type 2   . Dizziness    Chronic, admiet 07-2010,saw neuro, thought to be a peripheral issue   . Dry skin   . GERD (gastroesophageal reflux disease)   . Hyperlipidemia   . Hypertension   . Hyperthyroidism   . Keloid    @ chest  . Memory loss   . Osteoarthritis   . Osteopenia    per dexa 12/09  . Paroxysmal atrial fibrillation ([place])    cards d/c coumadin 12/2008 d/t persisten NSR and frequent falls- restarted coumadin july 2011, now on Eliquis  . Recurrent UTI   . Shortness of breath dyspnea   . TIA (transient ischemic attack)     Patient Active Problem List   Diagnosis Date Noted  . Chest pain 06/21/2017  . TIA (transient ischemic attack) 08/30/2016  . Acute kidney injury ([place]) 03/05/2016  . Exposure keratitis 09/23/2015  . Keratopathy, band 09/13/2015  . PCP NOTES >>>>> 04/05/2015  . Chronic diastolic heart failure ([place]) 12/29/2014  . Edema 12/20/2014    . Dyspnea 12/20/2014  . Angiectasia 03/30/2014  . GI bleed 03/28/2014  . Dementia ([place]) 09/16/2013  . Annual physical exam 11/21/2011  . ATRIAL FIBRILLATION, chronic 03/05/2007  . Diabetes mellitus with circulatory complication ([place]) 77/08/4033  . S/P AVR (aortic valve replacement) 10/15/2006  . Hyperthyroidism 09/03/2006  . Dyslipidemia 09/03/2006  . HTN (hypertension) 09/03/2006  . CAD (coronary artery disease) of artery bypass graft 09/03/2006  . Asthma, chronic 09/03/2006    Past Surgical History:  Procedure Laterality Date  . ABDOMINAL HYSTERECTOMY    . AORTIC VALVE REPLACEMENT  1998  . CARDIAC VALVE REPLACEMENT    . CAROTID DOPPLER  07/13/12   BILATERAL BULB/PROXIMAL ICAS;MILD AMOUNT OF FIBROUS PLAQUE WITH NO DIAMETER REDUCTION.  . CESAREAN SECTION     x 2  . COLONOSCOPY WITH PROPOFOL N/A 03/30/2014   Procedure: COLONOSCOPY WITH PROPOFOL;  Surgeon: Irene Shipper, MD;  Location: [facility];  Service: Endoscopy;  Laterality: N/A;  . CORONARY ARTERY BYPASS GRAFT  1998   SVG TO RCA  . HEMORRHOID SURGERY    . JOINT REPLACEMENT    . MYOCARDIAL PERFUSION STUDY  12/19/09   NORMAL PATTERN OF PERFUSION IN ALL REGIONS.EF 75%.  . OOPHORECTOMY    . TOTAL KNEE ARTHROPLASTY  1999  . TRANSTHORACIC ECHOCARDIOGRAM  09/11/11   LVEF >  47%.QQVZD 1 DIASTOLIC DYSFUNCTION,ELEVATED LV FILLING PRESSURE.BIOPROSTHETIC AORTIC VALVE-PEAK AND MEAN GRADIENTS OF 17 MMHG AND 8 MMHG.SIGMOID SEPTUM.MILD TO MOD MR.MILD TO MOD TR.RVSP 46 MMHG.     OB History    Gravida  4   Para  4   Term      Preterm      AB      Living        SAB      TAB      Ectopic      Multiple      Live Births               Home Medications    Prior to Admission medications   Medication Sig Start Date End Date Taking? Authorizing Provider  acetaminophen (TYLENOL) 500 MG tablet Take 1,000 mg by mouth every 6 (six) hours as needed (for pain).    Yes [provider]  albuterol (ACCUNEB) 0.63 MG/3ML  nebulizer solution Take 3 mLs (0.63 mg total) by nebulization every 6 (six) hours as needed for wheezing. 11/25/17  Yes Paz, Alda Berthold, MD  ALPRAZolam (XANAX) 0.25 MG tablet TAKE 1/2 TABLET OR TAKE 1 TABLET BY MOUTH AT NIGHT AS NEEDED FOR INSOMINIA Patient taking differently: Take 0.125-0.25 mg by mouth at bedtime as needed (for insomnia).  07/21/17  Yes Paz, Alda Berthold, MD  apixaban (ELIQUIS) 5 MG TABS tablet Take 1 tablet (5 mg total) by mouth 2 (two) times daily. 04/01/18  Yes Paz, Alda Berthold, MD  atorvastatin (LIPITOR) 40 MG tablet Take 1 tablet (40 mg total) by mouth at bedtime. 10/06/17  Yes Paz, Alda Berthold, MD  benazepril (LOTENSIN) 40 MG tablet Take 1 tablet (40 mg total) by mouth daily. 10/23/17  Yes Paz, Jacqulyn Bath E, MD  budesonide (PULMICORT) 0.5 MG/2ML nebulizer solution Take 2 mLs (0.5 mg total) by nebulization 2 (two) times daily. 11/25/17  Yes Paz, Alda Berthold, MD  Calcium Carbonate-Vitamin D (CALCIUM 500 + D) 500-125 MG-UNIT TABS Take 1 tablet by mouth at bedtime.   Yes [provider]  esomeprazole (NEXIUM) 40 MG capsule Take 1 capsule (40 mg total) by mouth daily as needed. Patient taking differently: Take 40 mg by mouth daily as needed (for reflux symptoms).  01/19/18  Yes Paz, Alda Berthold, MD  furosemide (LASIX) 20 MG tablet TAKE 2 TABLETS BY MOUTH EVERY DAY Patient taking differently: Take 40 mg by mouth daily.  02/04/18  Yes Paz, Alda Berthold, MD  isosorbide mononitrate (IMDUR) 30 MG 24 hr tablet TAKE 1 TABLET BY MOUTH EVERY DAY Patient taking differently: Take 30 mg by mouth daily.  01/05/18  Yes Simmons, Brittainy M, PA-C  magnesium oxide (MAG-OX) 400 MG tablet TAKE 1 TABLET BY MOUTH EVERY DAY Patient taking differently: Take 400 mg by mouth daily.  02/20/18  Yes Paz, Alda Berthold, MD  methimazole (TAPAZOLE) 5 MG tablet TAKE 1 TABLET BY MOUTH 3 TIMES A WEEK Patient taking differently: Take 5 mg by mouth every Monday, Wednesday, and Friday.  09/03/17  Yes Paz, Alda Berthold, MD  nitroGLYCERIN (NITROSTAT) 0.4 MG SL tablet  Place 1 tablet (0.4 mg total) under the tongue every 5 (five) minutes x 3 doses as needed for chest pain. 12/13/16  Yes Paz, Alda Berthold, MD  potassium chloride (KLOR-CON M10) 10 MEQ tablet Take 1 tablet (10 mEq total) by mouth daily. 01/22/18  Yes Paz, Alda Berthold, MD  ketoconazole (NIZORAL) 2 % cream Apply 1 application topically daily. Patient not taking: Reported on 04/13/2018 09/24/17  Kathlene November E, MD  metoprolol tartrate 37.5 MG TABS Take 37.5 mg by mouth 2 (two) times daily. 04/13/18   Julianne Rice, MD    Family History Family History  Problem Relation Age of Onset  . Heart attack Father   . Coronary artery disease Son 27       deceased  . Hypertension Brother   . Stroke Brother   . Colon cancer Neg Hx   . Breast cancer Neg Hx   . Thyroid disease Neg Hx     Social History Social History   Tobacco Use  . Smoking status: Former Research scientist (life sciences)  . Smokeless tobacco: Never Used  . Tobacco comment: quit 2004, smoked 1.5 ppd  Substance Use Topics  . Alcohol use: No    Alcohol/week: 0.0 standard drinks  . Drug use: No     Allergies   Hydrocodone and Tramadol hcl   Review of Systems Review of Systems  Constitutional: Negative for chills and fever.  Eyes: Negative for photophobia.  Respiratory: Negative for shortness of breath.   Cardiovascular: Negative for chest pain.  Gastrointestinal: Negative for abdominal pain, constipation, diarrhea, nausea and vomiting.  Genitourinary: Negative for dysuria, flank pain and frequency.  Musculoskeletal: Negative for back pain, myalgias and neck pain.  Skin: Negative for rash and wound.  Neurological: Negative for dizziness, weakness, light-headedness, numbness and headaches.  All other systems reviewed and are negative.    Physical Exam Updated Vital Signs BP (!) 172/82   Pulse 79   Temp 97.9 F (36.6 C) (Oral)   Resp 18   SpO2 98%   Physical Exam  Constitutional: She is oriented to person, place, and time. She appears well-developed and  well-nourished. No distress.  HENT:  Head: Normocephalic and atraumatic.  Mouth/Throat: Oropharynx is clear and moist. No oropharyngeal exudate.  Eyes: Pupils are equal, round, and reactive to light. EOM are normal.  Neck: Normal range of motion. Neck supple. No JVD present.  Cardiovascular: Regular rhythm. Exam reveals no gallop and no friction rub.  Murmur heard. Irregularly irregular  Pulmonary/Chest: Effort normal and breath sounds normal. No stridor. No respiratory distress. She has no wheezes. She has no rales. She exhibits no tenderness.  Abdominal: Soft. Bowel sounds are normal. There is no tenderness. There is no rebound and no guarding.  Musculoskeletal: Normal range of motion. She exhibits no edema or tenderness.  No definite midline thoracic or lumbar tenderness to palpation.  No CVA tenderness.  Distal pulses are 2+.  Lymphadenopathy:    She has no cervical adenopathy.  Neurological: She is alert and oriented to person, place, and time.  Moving all extremities without focal deficit.  Sensation intact.  Skin: Skin is warm and dry. No rash noted. She is not diaphoretic. No erythema.  Psychiatric: She has a normal mood and affect. Her behavior is normal.  Nursing note and vitals reviewed.    ED Treatments / Results  Labs (all labs ordered are listed, but only abnormal results are displayed) Labs Reviewed  BASIC METABOLIC PANEL - Abnormal; Notable for the following components:      Result Value   GFR calc non Af Amer 53 (*)    All other components within normal limits  CBC - Abnormal; Notable for the following components:   MCV 101.4 (*)    All other components within normal limits  I-STAT TROPONIN, ED    EKG None  Radiology Dg Chest 2 View  Result Date: 04/13/2018 CLINICAL DATA:  Acute hypertension. Prior  CABG and aortic valve replacement in 1998. EXAM: CHEST - 2 VIEW COMPARISON:  04/01/2018, 03/26/2018 and earlier. FINDINGS: Sternotomy for CABG and aortic valve  replacement. Cardiac silhouette moderately enlarged, unchanged. Thoracic aorta tortuous and atherosclerotic, unchanged. Hilar and mediastinal contours otherwise unremarkable. Scarring in the lingula. Lungs otherwise clear. Mild pulmonary venous hypertension without overt edema. Bronchovascular markings normal. No pleural effusions. Degenerative changes involving the thoracic and UPPER lumbar spine. IMPRESSION: Stable moderate cardiomegaly.  No acute cardiopulmonary disease. Electronically Signed   By: Evangeline Dakin M.D.   On: 04/13/2018 18:26    Procedures Procedures (including critical care time)  Medications Ordered in ED Medications  metoprolol tartrate (LOPRESSOR) tablet 37.5 mg (has no administration in time range)  labetalol (NORMODYNE,TRANDATE) injection 10 mg (10 mg Intravenous Given 04/13/18 2139)  acetaminophen (TYLENOL) tablet 650 mg (650 mg Oral Given 04/13/18 2138)     Initial Impression / Assessment and Plan / ED Course  I have reviewed the triage vital signs and the nursing notes.  Pertinent labs & imaging results that were available during my care of the patient were reviewed by me and considered in my medical decision making (see chart for details).    Blood pressure is improved to systolic of 834.  Patient remains asymptomatic.  Will increase metoprolol dose to 37.5 mg twice daily.  Understands need to follow-up very closely with her primary physician for blood pressure recheck.  Return precautions given.   Final Clinical Impressions(s) / ED Diagnoses   Final diagnoses:  Hypertension, unspecified type    ED Discharge Orders         Ordered    metoprolol tartrate 37.5 MG TABS  2 times daily     04/13/18 2229           Julianne Rice, MD 04/13/18 2229

## 2018-04-13 NOTE — Telephone Encounter (Signed)
FYI. Pt headed to ED.  

## 2018-04-14 ENCOUNTER — Telehealth: Payer: Self-pay | Admitting: Internal Medicine

## 2018-04-14 NOTE — Telephone Encounter (Signed)
Pt was seen @ the ER w/ elevated BP, metoprolol dose increased. Please ask pt's daughter  to monitor BPs, call w/ readings in few days, we can adjust meds over the phone, but schedule OV if needed

## 2018-04-14 NOTE — Telephone Encounter (Signed)
Spoke w/ Pt's daughter, informed of recommendations. She will call in several days w/ readings.

## 2018-04-21 ENCOUNTER — Other Ambulatory Visit: Payer: Self-pay | Admitting: Internal Medicine

## 2018-04-23 ENCOUNTER — Ambulatory Visit (INDEPENDENT_AMBULATORY_CARE_PROVIDER_SITE_OTHER): Payer: Medicare Other | Admitting: Internal Medicine

## 2018-04-23 ENCOUNTER — Encounter: Payer: Self-pay | Admitting: Internal Medicine

## 2018-04-23 VITALS — BP 148/82 | HR 82 | Temp 98.3°F | Resp 16 | Ht 62.0 in | Wt 171.1 lb

## 2018-04-23 DIAGNOSIS — I10 Essential (primary) hypertension: Secondary | ICD-10-CM

## 2018-04-23 MED ORDER — METOPROLOL TARTRATE 50 MG PO TABS: 50.0000 mg | ORAL_TABLET | Freq: Two times a day (BID) | ORAL | 6 refills | Status: DC

## 2018-04-23 MED ORDER — AMLODIPINE BESYLATE 5 MG PO TABS: 5.0000 mg | ORAL_TABLET | Freq: Every day | ORAL | 6 refills | Status: DC

## 2018-04-23 NOTE — Progress Notes (Signed)
Pre visit review using our clinic review tool, if applicable. No additional management support is needed unless otherwise documented below in the visit note. 

## 2018-04-23 NOTE — Patient Instructions (Addendum)
Please schedule Medicare Wellness with Glenard Haring.   Increase metoprolol to 50 mg: 1 tablet twice a day  Start a medication called amlodipine 5 mg: 1 tablet every day  Check the  blood pressure daily  GOAL:  110/65 and  145/85. Call with readings next week

## 2018-04-23 NOTE — Progress Notes (Signed)
Subjective:    Patient ID: Rebecca Skinner, female    DOB: 09/09/1922, 84 y.o.   MRN: [id number]  DOS:  04/23/2018 Type of visit - description : ER follow-up, here with her daughter Interval history: Went to the ER 04/13/2018. At the time she presented with elevated BP. It was over 200 prior to the evaluation. she does not have a headache or chest pain or shortness of breath. Work-up included a BMP, CBC chest x-ray: Unremarkable S/p Metoprolol, IV labetalol, Tylenol, BP went down to 172. Was recommended to increase outpatient metoprolol dose.   BP Readings from Last 3 Encounters:  04/23/18 (!) 148/82  04/13/18 (!) 162/104  04/06/18 (!) 142/98    Review of Systems Patient's daughter reports that she actually feels well. No chest pain no difficulty breathing No edema Good compliance with medication Not taking NSAIDs. Ambulatory BPs range from 160/80 - 140/88.   Past Medical History:  Diagnosis Date  . Anxiety 11/20/2011  . Asthma    UNDER THE CARE [facility] DR Georjean Toya  . CAD (coronary artery disease)    s/p CABG s/p AO valve replacement ([place] [facility])  . CVA (cerebral infarction) 08-2013   multiple, L, d/t Afib, started eloquis  . Diabetes mellitus    type 2   . Dizziness    Chronic, admiet 07-2010,saw neuro, thought to be a peripheral issue   . Dry skin   . GERD (gastroesophageal reflux disease)   . Hyperlipidemia   . Hypertension   . Hyperthyroidism   . Keloid    @ chest  . Memory loss   . Osteoarthritis   . Osteopenia    per dexa 12/09  . Paroxysmal atrial fibrillation ([place])    cards d/c coumadin 12/2008 d/t persisten NSR and frequent falls- restarted coumadin july 2011, now on Eliquis  . Recurrent UTI   . Shortness of breath dyspnea   . TIA (transient ischemic attack)     Past Surgical History:  Procedure Laterality Date  . ABDOMINAL HYSTERECTOMY    . AORTIC VALVE REPLACEMENT  1998  . CARDIAC VALVE REPLACEMENT    . CAROTID DOPPLER  07/13/12   BILATERAL  BULB/PROXIMAL ICAS;MILD AMOUNT OF FIBROUS PLAQUE WITH NO DIAMETER REDUCTION.  . CESAREAN SECTION     x 2  . COLONOSCOPY WITH PROPOFOL N/A 03/30/2014   Procedure: COLONOSCOPY WITH PROPOFOL;  Surgeon: Irene Shipper, MD;  Location: [facility];  Service: Endoscopy;  Laterality: N/A;  . CORONARY ARTERY BYPASS GRAFT  1998   SVG TO RCA  . HEMORRHOID SURGERY    . JOINT REPLACEMENT    . MYOCARDIAL PERFUSION STUDY  12/19/09   NORMAL PATTERN OF PERFUSION IN ALL REGIONS.EF 75%.  . OOPHORECTOMY    . TOTAL KNEE ARTHROPLASTY  1999  . TRANSTHORACIC ECHOCARDIOGRAM  09/11/11   LVEF >55%.STAGE 1 DIASTOLIC DYSFUNCTION,ELEVATED LV FILLING PRESSURE.BIOPROSTHETIC AORTIC VALVE-PEAK AND MEAN GRADIENTS OF 17 MMHG AND 8 MMHG.SIGMOID SEPTUM.MILD TO MOD MR.MILD TO MOD TR.RVSP 46 MMHG.    Social History   Socioeconomic History  . Marital status: Divorced    Spouse name: Not on file  . Number of children: 4  . Years of education: Not on file  . Highest education level: Not on file  Occupational History  . Occupation: retired     Fish farm manager: RETIRED  Social Needs  . Financial resource strain: Not on file  . Food insecurity:    Worry: Not on file    Inability: Not on file  .  Transportation needs:    Medical: Not on file    Non-medical: Not on file  Tobacco Use  . Smoking status: Former Research scientist (life sciences)  . Smokeless tobacco: Never Used  . Tobacco comment: quit 2004, smoked 1.5 ppd  Substance and Sexual Activity  . Alcohol use: No    Alcohol/week: 0.0 standard drinks  . Drug use: No  . Sexual activity: Not Currently  Lifestyle  . Physical activity:    Days per week: Not on file    Minutes per session: Not on file  . Stress: Not on file  Relationships  . Social connections:    Talks on phone: Not on file    Gets together: Not on file    Attends religious service: Not on file    Active member of club or organization: Not on file    Attends meetings of clubs or organizations: Not on file    Relationship status:  Not on file  . Intimate partner violence:    Fear of current or ex partner: Not on file    Emotionally abused: Not on file    Physically abused: Not on file    Forced sexual activity: Not on file  Other Topics Concern  . Not on file  Social History Narrative   Had to moved w/ [name]  ~ 01-2016   Had 3 daughter- 2 son  (lost oldest daughter and son), 2 living daughters in [place] as of 04/23/2018      Reactions   Hydrocodone Itching, Nausea Only   Tramadol Hcl Itching, Nausea Only      Medication List        Accurate as of 04/23/18 11:59 PM. Always use your most recent med list.          acetaminophen 500 MG tablet Commonly known as:  TYLENOL Take 1,000 mg by mouth every 6 (six) hours as needed (for pain).   albuterol 0.63 MG/3ML nebulizer solution Commonly known as:  ACCUNEB Take 3 mLs by nebulization every 6 (six) hours as needed for wheezing or shortness of breath.   ALPRAZolam 0.25 MG tablet Commonly known as:  XANAX TAKE 1/2 TABLET OR TAKE 1 TABLET BY MOUTH AT NIGHT AS NEEDED FOR INSOMINIA   amLODipine 5 MG tablet Commonly known as:  NORVASC Take 1 tablet (5 mg total) by mouth daily.   apixaban 5 MG Tabs tablet Commonly known as:  ELIQUIS Take 1 tablet (5 mg total) by mouth 2 (two) times daily.   atorvastatin 40 MG tablet Commonly known as:  LIPITOR Take 1 tablet (40 mg total) by mouth at bedtime.   benazepril 40 MG tablet Commonly known as:  LOTENSIN Take 1 tablet (40 mg total) by mouth daily.   budesonide 0.5 MG/2ML nebulizer solution Commonly known as:  PULMICORT Take 2 mLs (0.5 mg total) by nebulization 2 (two) times daily.   CALCIUM 500 + D 500-125 MG-UNIT Tabs Generic drug:  Calcium Carbonate-Vitamin D Take 1 tablet by mouth at bedtime.   esomeprazole 40 MG capsule Commonly known as:  NEXIUM Take 1 capsule (40 mg total) by mouth daily as needed.   furosemide 20 MG tablet Commonly known as:  LASIX TAKE 2 TABLETS BY MOUTH  EVERY DAY   isosorbide mononitrate 30 MG 24 hr tablet Commonly known as:  IMDUR TAKE 1 TABLET BY MOUTH EVERY DAY   ketoconazole 2 % cream Commonly known as:  NIZORAL Apply 1 application topically daily.   magnesium oxide  400 MG tablet Commonly known as:  MAG-OX TAKE 1 TABLET BY MOUTH EVERY DAY   methimazole 5 MG tablet Commonly known as:  TAPAZOLE TAKE 1 TABLET BY MOUTH 3 TIMES A WEEK   metoprolol tartrate 50 MG tablet Commonly known as:  LOPRESSOR Take 1 tablet (50 mg total) by mouth 2 (two) times daily.   nitroGLYCERIN 0.4 MG SL tablet Commonly known as:  NITROSTAT Place 1 tablet (0.4 mg total) under the tongue every 5 (five) minutes x 3 doses as needed for chest pain.   potassium chloride 10 MEQ tablet Commonly known as:  K-DUR,KLOR-CON Take 1 tablet (10 mEq total) by mouth daily.          Objective:   Physical Exam BP (!) 148/82 (BP Location: Left Arm, Patient Position: Sitting, Cuff Size: Normal)   Pulse 82   Temp 98.3 F (36.8 C) (Oral)   Resp 16   Ht 5\' 2"  (1.575 m)   Wt 171 lb 2 oz (77.6 kg)   SpO2 97%   BMI 31.30 kg/m  General:   Well developed, NAD, BMI noted. HEENT:  Normocephalic . Face symmetric, atraumatic Lungs:  Today lungs sound clear Normal respiratory effort, no intercostal retractions, no accessory muscle use. Heart: Irregularly irregular.  No pretibial edema bilaterally  Skin: Not pale. Not jaundice Neurologic:  alert & oriented to self.  Speech normal, gait at baseline Psych  Behavior appropriate. No anxious or depressed appearing.      Assessment & Plan:   Assessment DM HTN Hyperlipidemia Hyperthyroidism -- used to see Dr. Loanne Drilling, last visit 01-2016, now f/u PCP, check labs x 2 q year GERD Asthma  Anxiety on xanax  CV: Dr. Claiborne Billings --CHF, CAD, CABG, Aortic valve replacement --P- atrial fibrillation, used to be on Coumadin, Eliquis rx after the stroke 3-15, d/c 03-2014 d/t GI bleed , on ASA 81. Dx TIA 08-2016, back on  eliquis, asa d/c. ASA restarted after admission w/ CP 06-2017 [facility]UR[facility] --TIAs (admited 08-2016), CVA 2015 --Dementia MMSE ~ 12 to 15  (12-2016) DJD Osteopenia 2009 Recurrent UTIs Daughter  Quenisha Lovins; lives w/ [name]  PLAN HTN: Needs better control. Reports good compliance with medication, not taking NSAIDs, her hyperthyroidism is controlled. On chart review, she had 2 CTs with contrast this year, last one was in October, her  kidney function remain stable. Ambulatory BPs range from 142/88-177/95. Current meds: imdur 30 mg 1 tablet daily, Lotensin 40 mg 1 tablet daily Metoprolol 25 mg: 1.5 tablet B.I.D., potassium 10 mEq daily, Lasix 20 mg: 2 tablets daily. Plan: Increase metoprolol to 50 mg twice daily Start amlodipine 5 mg daily. Monitor BPs daily Call with readings next week, watch for swelling Follow-up 1 month

## 2018-04-26 NOTE — Assessment & Plan Note (Signed)
HTN: Needs better control. Reports good compliance with medication, not taking NSAIDs, her hyperthyroidism is controlled. On chart review, she had 2 CTs with contrast this year, last one was in October, her  kidney function remain stable. Ambulatory BPs range from 142/88-177/95. Current meds: imdur 30 mg 1 tablet daily, Lotensin 40 mg 1 tablet daily Metoprolol 25 mg: 1.5 tablet B.I.D., potassium 10 mEq daily, Lasix 20 mg: 2 tablets daily. Plan: Increase metoprolol to 50 mg twice daily Start amlodipine 5 mg daily. Monitor BPs daily Call with readings next week, watch for swelling Follow-up 1 month

## 2018-05-20 ENCOUNTER — Other Ambulatory Visit: Payer: Self-pay | Admitting: Internal Medicine

## 2018-05-20 MED ORDER — METOPROLOL TARTRATE 50 MG PO TABS: 50.0000 mg | ORAL_TABLET | Freq: Two times a day (BID) | ORAL | 1 refills | Status: DC

## 2018-06-29 ENCOUNTER — Other Ambulatory Visit: Payer: Self-pay | Admitting: Cardiology

## 2018-06-29 NOTE — Telephone Encounter (Signed)
Rx has been sent to the pharmacy electronically. ° °

## 2018-07-03 ENCOUNTER — Other Ambulatory Visit: Payer: Self-pay | Admitting: Internal Medicine

## 2018-07-17 ENCOUNTER — Other Ambulatory Visit: Payer: Self-pay | Admitting: Internal Medicine

## 2018-08-04 ENCOUNTER — Encounter: Payer: Self-pay | Admitting: Cardiovascular Disease

## 2018-08-04 ENCOUNTER — Ambulatory Visit: Payer: Medicare Other | Admitting: Cardiovascular Disease

## 2018-08-04 ENCOUNTER — Encounter (INDEPENDENT_AMBULATORY_CARE_PROVIDER_SITE_OTHER): Payer: Self-pay

## 2018-08-04 VITALS — BP 124/80 | HR 64 | Ht 62.0 in | Wt 165.6 lb

## 2018-08-04 DIAGNOSIS — E785 Hyperlipidemia, unspecified: Secondary | ICD-10-CM

## 2018-08-04 DIAGNOSIS — I10 Essential (primary) hypertension: Secondary | ICD-10-CM

## 2018-08-04 DIAGNOSIS — K219 Gastro-esophageal reflux disease without esophagitis: Secondary | ICD-10-CM

## 2018-08-04 DIAGNOSIS — I4821 Permanent atrial fibrillation: Secondary | ICD-10-CM | POA: Diagnosis not present

## 2018-08-04 DIAGNOSIS — I25810 Atherosclerosis of coronary artery bypass graft(s) without angina pectoris: Secondary | ICD-10-CM | POA: Diagnosis not present

## 2018-08-04 DIAGNOSIS — Z952 Presence of prosthetic heart valve: Secondary | ICD-10-CM

## 2018-08-04 DIAGNOSIS — Z7901 Long term (current) use of anticoagulants: Secondary | ICD-10-CM

## 2018-08-04 DIAGNOSIS — Z8673 Personal history of transient ischemic attack (TIA), and cerebral infarction without residual deficits: Secondary | ICD-10-CM

## 2018-08-04 NOTE — Patient Instructions (Signed)
Medication Instructions:  Continue current medications  If you need a refill on your cardiac medications before your next appointment, please call your pharmacy.  Labwork: None Ordered .  Testing/Procedures: Your physician has requested that you have an echocardiogram in 6 Months. Echocardiography is a painless test that uses sound waves to create images of your heart. It provides your doctor with information about the size and shape of your heart and how well your heart's chambers and valves are working. This procedure takes approximately one hour. There are no restrictions for this procedure.  Follow-Up: You will need a follow up appointment in 6 months.  Please call our office 2 months in advance to schedule this appointment.  You may see Shelva Majestic, MD or one of the following Advanced Practice Providers on your designated Care Team: [place], [place] . Fabian Sharp, PA-C   At [facility], you and your health needs are our priority.  As part of our continuing mission to provide you with exceptional heart care, we have created designated Provider Care Teams.  These Care Teams include your primary Cardiologist (physician) and Advanced Practice Providers (APPs -  Physician Assistants and Nurse Practitioners) who all work together to provide you with the care you need, when you need it.  Thank you for choosing [facility] at [facility]!!

## 2018-08-04 NOTE — Progress Notes (Signed)
Patient ID: Rebecca Skinner, female   DOB: Sep 11, 1922, 85 y.o.   MRN: [id number]      HPI: Rebecca Skinner is an 85 year old female who  established cardiology care with me in February 2015. She is a former patient of Dr. Rollene Fare.  She was last seen by me in July 2018 . She presents for presents for follow-up cardiology evaluation.  Rebecca Skinner underwent a pericardial aortic valve replacement in 1998 and single vessel bypass with a vein graft to the right artery artery. She also has a history of sick sinus syndrome with paroxysmal atrial fibrillation and was taken off Coumadin anticoagulation in the past due to multiple falls in 2009 in 2010. There is no history of CVA or TIAs. She does have significant arthritic issues with back discomfort and also arthritis of her knees. She last saw Dr. Rollene Fare one year ago and at that time, she was in a normal sinus rhythm.  In 2015  [name] had noticed increasing palpitations as well as chest fluttering. She has significant shortness of breath almost all the time. She currently is living with her grandson. She does note some wheezing. Her last nuclear perfusion study was in 2011 which was normal. Her last echo Doppler study was in 2013 which showed moderate asymmetric LVH with septal wall at 1.6 cm and posterior wall 1.1 cm of the sigmoid septum. Ejection fraction was greater than 55%. She had mild to moderate mitral regurgitation, mild to moderate tricuspid regurgitation with elevation of RV systolic pressure at 46 mm. There bioprosthetic aortic valve had a peak gradient of 17 and mean gradient of 8 within normal limits for this valve. On 08/12/2013 she underwent a nuclear perfusion study which was interpreted as intermediate risk with it suggested a fixed lateral defect with minimal peri-infarction ischemia. A CardioNet monitor had shown atrial fibrillation with bursts of increased heart rate.  She walks with a walker. She has undergone left knee replacement. She has  history of diabetes mellitus as well as hypertension. She had been on dual antiplatelet therapy with aspirin and Plavix. She was hospitalized on on the neurology service from 3/20 - 24, 2015 with CVA. Her symptoms have resolved. Carotid studies demonstrated mild internal carotid stenoses of less than 40%. During that evaluation, she apparently was taken off aspirin and Plavix by Dr. [name] and was given a prescription for Eliquis  5 mg since was felt that her CVA was due to atrial fibrillation.  She presented to [facility] emergency room on 03/28/2014 with complaints of rectal bleeding for approximately a week.  She was found to have significant anemia with a hemoglobin of 7.4 and hematocrit of 22.2.  She was treated with 3 units of packed red blood cells during her admission.  Eliquis and aspirin were held, and she is not been on this since. And was restarted back on aspirin.  She underwent colonoscopy and was found to have an AVM in the cecum area, which was felt to be the cause of her lower extremity bleeding.  n July 2016 a follow-up echo Doppler study showed an EF of 60-65%.  There was grade 2 diastolic dysfunction.  Her bioprosthetic aortic valve was well-seated and functioning normally.  Mean gradient was 5.  There was moderate RA dilatation and severe TR with increased.  PA pressure at 63 mm.  When I saw her on July 11, 2016, she had  very slow atrial fibrillation at 45 beats and suggested a junctional rhythm.  At  that time, she had been on diltiazem 120 mg twice a day, and this was weaned and ultimately discontinued.  Subsequently , she  resumed taking just 120 mg daily.   A repeat echo Doppler study on 08/06/2016 showed an EF of 65-70%.  His aortic bioprosthesis was functioning well with a mean gradient of 6.  Her left atrium was moderately dilated.  There was mild MR, but she had evidence for severe tricuspid regurgitation with moderate pulmonary hypertension with estimated systolic pressure at  51 mm.  She also has diabetes mellitus, as well as thyroid disease.    She was hospitalized overnight on 08/30/2016, she developed a aphasia and became confused.  Deficits resolved within an hour and were gone by the time she had arrived in the emergency room.  She was felt to have had a TIA.  An MRI did not reveal any acute infarct.  An MRA showed moderate to severe stenosis at the origen of the left SMA anterior M2 branch.  A CT of her head showed chronic bilateral MCA infarcts.  Carotid Doppler study showed 1-39% ICA stenosis with antegrade vertebral flow.  She was seen by Dr. Collie Siad of neurology, although she had issues with GI bleeds in the past felt that her AVMs were treated, and although there was some risk with reinstitution of anticoagulation she was started on eliquis 5 mg twice a day.  I last saw her in July 2018.  At that time she denied any  recurrent neurologic symptoms.  She was on anticoagulation with eliquis she was aware of the risks and benefits of anticoagulation regarding fall risk versus potential recurrent neurologic symptoms.  Since I last saw her, she had been hospitalized in January 2019 for 2 days and an echo Doppler study showed an EF of 60 to 65%.  RV was mildly dilated with mildly decreased systolic function.  She had a D-shaped interventricular septum suggestive of RV pressure/volume overload.  There was moderate pulmonary hypertension with an estimated PA peak pressure at 62 mm.  Her bioprosthetic aortic valve appeared to function normally.  Since her hospitalization, she has been evaluated on numerous occasions by Jory Sims, NP.  She also has had several ER evaluations with increased blood pressure.  She last saw her primary physician Dr. Larose Kells in November 2019 in follow-up of her ER evaluation and her medications were adjusted.  Recently, she has felt well.  She denies chest tightness or pressure.  She has atrial fibrillation with a controlled rate.  She continues to be  on amlodipine 5 mg, benazepril 40 mg, furosemide 40 mg, isosorbide 30 mg, and metoprolol 50 mg twice a day.  Her blood pressures recently have been stable.  She continues to be on Eliquis 5 mg twice a day.  She has not had recent falls.  She is on Nexium for GERD.  She presents for evaluation.  Past Medical History:  Diagnosis Date  . Anxiety 11/20/2011  . Asthma    UNDER THE CARE [facility] DR PAZ  . CAD (coronary artery disease)    s/p CABG s/p AO valve replacement ([place] [facility])  . CVA (cerebral infarction) 08-2013   multiple, L, d/t Afib, started eloquis  . Diabetes mellitus    type 2   . Dizziness    Chronic, admiet 07-2010,saw neuro, thought to be a peripheral issue   . Dry skin   . GERD (gastroesophageal reflux disease)   . Hyperlipidemia   . Hypertension   . Hyperthyroidism   .  Keloid    @ chest  . Memory loss   . Osteoarthritis   . Osteopenia    per dexa 12/09  . Paroxysmal atrial fibrillation ([place])    cards d/c coumadin 12/2008 d/t persisten NSR and frequent falls- restarted coumadin july 2011, now on Eliquis  . Recurrent UTI   . Shortness of breath dyspnea   . TIA (transient ischemic attack)     Past Surgical History:  Procedure Laterality Date  . ABDOMINAL HYSTERECTOMY    . AORTIC VALVE REPLACEMENT  1998  . CARDIAC VALVE REPLACEMENT    . CAROTID DOPPLER  07/13/12   BILATERAL BULB/PROXIMAL ICAS;MILD AMOUNT OF FIBROUS PLAQUE WITH NO DIAMETER REDUCTION.  . CESAREAN SECTION     x 2  . COLONOSCOPY WITH PROPOFOL N/A 03/30/2014   Procedure: COLONOSCOPY WITH PROPOFOL;  Surgeon: Irene Shipper, MD;  Location: [facility]COPY;  Service: Endoscopy;  Laterality: N/A;  . CORONARY ARTERY BYPASS GRAFT  1998   SVG TO RCA  . HEMORRHOID SURGERY    . JOINT REPLACEMENT    . MYOCARDIAL PERFUSION STUDY  12/19/09   NORMAL PATTERN OF PERFUSION IN ALL REGIONS.EF 75%.  . OOPHORECTOMY    . TOTAL KNEE ARTHROPLASTY  1999  . TRANSTHORACIC ECHOCARDIOGRAM  09/11/11   LVEF >55%.STAGE 1 DIASTOLIC  DYSFUNCTION,ELEVATED LV FILLING PRESSURE.BIOPROSTHETIC AORTIC VALVE-PEAK AND MEAN GRADIENTS OF 17 MMHG AND 8 MMHG.SIGMOID SEPTUM.MILD TO MOD MR.MILD TO MOD TR.RVSP 46 MMHG.    Allergies  Allergen Reactions  . Hydrocodone Itching and Nausea Only  . Tramadol Hcl Itching and Nausea Only    Current Outpatient Medications  Medication Sig Dispense Refill  . acetaminophen (TYLENOL) 500 MG tablet Take 1,000 mg by mouth every 6 (six) hours as needed (for pain).     Marland Kitchen albuterol (ACCUNEB) 0.63 MG/3ML nebulizer solution Take 3 mLs by nebulization every 6 (six) hours as needed for wheezing or shortness of breath. 75 mL 5  . ALPRAZolam (XANAX) 0.25 MG tablet TAKE 1/2 TABLET OR TAKE 1 TABLET BY MOUTH AT NIGHT AS NEEDED FOR INSOMINIA (Patient taking differently: Take 0.125-0.25 mg by mouth at bedtime as needed (for insomnia). ) 30 tablet 0  . amLODipine (NORVASC) 5 MG tablet Take 1 tablet (5 mg total) by mouth daily. 30 tablet 6  . apixaban (ELIQUIS) 5 MG TABS tablet Take 1 tablet (5 mg total) by mouth 2 (two) times daily. 60 tablet 6  . atorvastatin (LIPITOR) 40 MG tablet Take 1 tablet (40 mg total) by mouth at bedtime. 90 tablet 3  . benazepril (LOTENSIN) 40 MG tablet Take 1 tablet (40 mg total) by mouth daily. 90 tablet 1  . budesonide (PULMICORT) 0.5 MG/2ML nebulizer solution Take 2 mLs (0.5 mg total) by nebulization 2 (two) times daily. 120 mL 5  . Calcium Carbonate-Vitamin D (CALCIUM 500 + D) 500-125 MG-UNIT TABS Take 1 tablet by mouth at bedtime.    Marland Kitchen esomeprazole (NEXIUM) 40 MG capsule Take 1 capsule (40 mg total) by mouth daily as needed. (Patient taking differently: Take 40 mg by mouth daily as needed (for reflux symptoms). ) 90 capsule 3  . furosemide (LASIX) 20 MG tablet TAKE 2 TABLETS BY MOUTH EVERY DAY (Patient taking differently: Take 40 mg by mouth daily. ) 180 tablet 1  . isosorbide mononitrate (IMDUR) 30 MG 24 hr tablet TAKE 1 TABLET BY MOUTH EVERY DAY 90 tablet 1  . ketoconazole (NIZORAL) 2  % cream Apply 1 application topically daily. 30 g 0  . KLOR-CON M10  10 MEQ tablet TAKE 1 TABLET BY MOUTH EVERY DAY 90 tablet 1  . magnesium oxide (MAG-OX) 400 MG tablet TAKE 1 TABLET BY MOUTH EVERY DAY (Patient taking differently: Take 400 mg by mouth daily. ) 90 tablet 1  . methimazole (TAPAZOLE) 5 MG tablet TAKE 1 TABLET BY MOUTH 3 TIMES A WEEK (Patient taking differently: Take 5 mg by mouth every Monday, Wednesday, and Friday. ) 40 tablet 3  . metoprolol tartrate (LOPRESSOR) 50 MG tablet Take 1 tablet (50 mg total) by mouth 2 (two) times daily. 180 tablet 1  . nitroGLYCERIN (NITROSTAT) 0.4 MG SL tablet Place 1 tablet (0.4 mg total) under the tongue every 5 (five) minutes x 3 doses as needed for chest pain. 25 tablet 3   No current facility-administered medications for this visit.     Socially she is widowed. She lives with her grandson. She has 2 deceased children, 4 children alive as well as 4 grandchildren 3 great-grandchildren. There is no tobacco use.  Family History  Problem Relation Age of Onset  . Heart attack Father   . Coronary artery disease Son 33       deceased  . Hypertension Brother   . Stroke Brother   . Colon cancer Neg Hx   . Breast cancer Neg Hx   . Thyroid disease Neg Hx    FH is noteworthy in that her father suffered an MI at age 27. Her mother died at childbirth.  ROS General: Negative; No fevers, chills, or night sweats;  HEENT: Negative; No changes in vision or hearing, sinus congestion, difficulty swallowing Pulmonary: Negative; No cough, wheezing, shortness of breath, hemoptysis Cardiovascular: No chest pain, presyncope, syncope, palpitations GI: Recent GI blood loss requiring 3 unit packed red blood cell transfusion GU: Negative; No dysuria, hematuria, or difficulty voiding Musculoskeletal: Positive for chronic low back discomfort Hematologic/Oncology: Negative; no easy bruising, bleeding Endocrine: Negative; no heat/cold intolerance; no diabetes Neuro:  Positive CVA March 2015 resolution of symptoms Skin: Negative; No rashes or skin lesions Psychiatric: Negative; No behavioral problems, depression Sleep: Negative; No snoring, daytime sleepiness, hypersomnolence, bruxism, restless legs, hypnogognic hallucinations, no cataplexy Other comprehensive 14 point system review is negative.  PE BP 124/80   Pulse 64   Ht '5\' 2"'  (1.575 m)   Wt 165 lb 9.6 oz (75.1 kg)   BMI 30.29 kg/m    Repeat blood pressure by me was 114/78  Wt Readings from Last 3 Encounters:  08/04/18 165 lb 9.6 oz (75.1 kg)  04/23/18 171 lb 2 oz (77.6 kg)  04/06/18 168 lb 8 oz (76.4 kg)   General: Alert, oriented, no distress.  Skin: normal turgor, no rashes, warm and dry HEENT: Normocephalic, atraumatic. Pupils equal round and reactive to light; sclera anicteric; extraocular muscles intact;  Nose without nasal septal hypertrophy Mouth/Parynx benign; Mallinpatti scale 2 Neck: No JVD, no carotid bruits; normal carotid upstroke Lungs: clear to ausculatation and percussion; no wheezing or rales Chest wall: without tenderness to palpitation Heart: PMI not displaced, irregularly irregular rhythm consistent with atrial fibrillation with a controlled ventricular rate in the 60s, s1 s2 normal, 1/6 systolic murmur, no diastolic murmur, no rubs, gallops, thrills, or heaves Abdomen: soft, nontender; no hepatosplenomehaly, BS+; abdominal aorta nontender and not dilated by palpation. Back: no CVA tenderness Pulses 2+ Musculoskeletal: full range of motion, normal strength, no joint deformities Extremities: no clubbing cyanosis or edema, Homan's sign negative  Neurologic: grossly nonfocal; Cranial nerves grossly wnl Psychologic: Normal mood and affect  ECG (independently read by me): Atrial Fibrillation at 64; NSSTT changes   July 2018 ECG (independently read by me): Atrial fibrillation at 66 bpm with an occasional PVC.  QTc interval 471 ms  09/10/2016 ECG (independently read by  me): Atrial fibrillation at 66 bpm.  Nonspecific diffuse T-wave abnormalities.  QTc interval 434 ms.  08/26/2016 ECG (independently read by me): Atrial fibrillation with rate 70-80.  Isolated PVC.  QTc interval 490 ms.  Lateral anterolateral T-wave changes.  July 11, 2016 ECG (independently read by me): Slow atrial fibrillation/junctional rhythm with heart rate 45 bpm.  Nonspecific ST changes.  QTc interval 427 ms.  December 2015 ECG (Independently read by me): Atrial fibrillation at 66 bpm.  Nonspecific ST changes.  April 2015 ECG (Independently read by me): Atrial fibrillation at 76 beats per minute. QTc interval 445  Prior ECG (independently read by me): Atrial flutter with variable block at approximately 65 beats per minute  LABS: BMP Latest Ref Rng & Units 04/13/2018 04/01/2018 12/29/2017  Glucose 70 - 99 mg/dL 92 135(H) 106(H)  BUN 8 - 23 mg/dL '11 12 16  ' Creatinine 0.44 - 1.00 mg/dL 0.91 0.92 1.05(H)  BUN/Creat Ratio 12 - 28 - - 15  Sodium 135 - 145 mmol/L 141 140 143  Potassium 3.5 - 5.1 mmol/L 3.5 3.9 4.0  Chloride 98 - 111 mmol/L 107 103 98  CO2 22 - 32 mmol/L '26 31 29  ' Calcium 8.9 - 10.3 mg/dL 8.9 8.9 9.2   Hepatic Function Latest Ref Rng & Units 04/06/2018 09/24/2017 08/30/2016  Total Protein 6.5 - 8.1 g/dL - - 6.6  Albumin 3.5 - 5.0 g/dL - - 3.2(L)  AST 0 - 37 U/L '14 16 31  ' ALT 0 - 35 U/L '13 17 26  ' Alk Phosphatase 38 - 126 U/L - - 82  Total Bilirubin 0.3 - 1.2 mg/dL - - 0.7  Bilirubin, Direct 0.0 - 0.3 mg/dL - - -   CBC Latest Ref Rng & Units 04/13/2018 04/01/2018 03/26/2018  WBC 4.0 - 10.5 K/uL 6.1 11.6(H) 8.8  Hemoglobin 12.0 - 15.0 g/dL 12.9 13.2 13.3  Hematocrit 36.0 - 46.0 % 42.8 42.7 40.7  Platelets 150 - 400 K/uL 203 212 197.0   Lab Results  Component Value Date   MCV 101.4 (H) 04/13/2018   MCV 100.2 (H) 04/01/2018   MCV 97.9 03/26/2018   Lab Results  Component Value Date   TSH 1.82 04/06/2018   Lab Results  Component Value Date   HGBA1C 7.4 (H)  04/06/2018   Lipid Panel     Component Value Date/Time   CHOL 129 09/24/2017 0910   TRIG 77.0 09/24/2017 0910   HDL 44.30 09/24/2017 0910   CHOLHDL 3 09/24/2017 0910   VLDL 15.4 09/24/2017 0910   LDLCALC 69 09/24/2017 0910    RADIOLOGY: No results found.  IMPRESSION:  1. Coronary artery disease involving coronary bypass graft of native heart without angina pectoris   2. S/P AVR (aortic valve replacement)   3. Essential hypertension   4. Permanent atrial fibrillation   5. Hyperlipidemia with target LDL less than 70   6. Anticoagulation adequate   7. History of transient ischemic attack (TIA)   8. Gastroesophageal reflux disease without esophagitis      ASSESSMENT AND PLAN: Ms. Nakeya Adinolfi is an 85 year old African American female who is 22 years status post aortic valve replacement with a pericardial tissue valve at which time she underwent single-vessel bypass with a saphenous vein graft placed to  her RCA in 1998.  A nuclear perfusion study showed a lateral defect with minimal peri-infarction ischemia was not felt to be high risk. She has a history of atrial fibrillation and in the past was felt to be a fall risk for Coumadin.  In March 2015 she was found to have probable multiple embolic events leading to her neurologic symptoms.  Initially was on eliquis  anticoagulation, but due to GI bleeding from AVMs which were cauterized and treated.  She had been on aspirin therapy. Due to having recurrent neurologic episodes and a TIA associated with transient aphasia and confusion anticoagulation was reinstituted.  Over the past 2 years since I have seen her, she has continued to be in permanent atrial fibrillation.  She continues to be on Eliquis anticoagulation.  She had had issues requiring titration of blood pressure medications but at present on a 5 drug regimen consisting of amlodipine, benazepril, furosemide, isosorbide, and metoprolol, her blood pressure today is excellent.  Her atrial  fibrillation rate is well controlled in the 60s.  I reviewed her echo Doppler from January 2019 which at that time showed a stable bioprosthetic aortic valve.  I reviewed laboratory from her office visit with Dr. pause in November 2019.  I reviewed her subsequent evaluations since her last with me.  At present, she is doing remarkably well.  In 6 months, I am recommending she undergo a follow-up echo Doppler study to reassess her aortic valve and systolic and diastolic function.  She continues to be on atorvastatin 40 mg for hyperlipidemia.  Laboratory in 2019 showed an LDL cholesterol at 69.  She continues to be on Nexium for her GERD which is well controlled.  I will see her in the office subsequent to the echo for follow-up evaluation.     Troy Sine, MD, [facility] 08/04/2018 6:23 PM

## 2018-08-06 ENCOUNTER — Other Ambulatory Visit: Payer: Self-pay | Admitting: Internal Medicine

## 2018-09-15 ENCOUNTER — Other Ambulatory Visit: Payer: Self-pay | Admitting: Internal Medicine

## 2018-09-23 ENCOUNTER — Other Ambulatory Visit: Payer: Self-pay | Admitting: Internal Medicine

## 2018-10-06 ENCOUNTER — Ambulatory Visit (INDEPENDENT_AMBULATORY_CARE_PROVIDER_SITE_OTHER): Payer: Medicare Other | Admitting: Internal Medicine

## 2018-10-06 ENCOUNTER — Other Ambulatory Visit: Payer: Self-pay

## 2018-10-06 DIAGNOSIS — E1159 Type 2 diabetes mellitus with other circulatory complications: Secondary | ICD-10-CM | POA: Diagnosis not present

## 2018-10-06 DIAGNOSIS — R21 Rash and other nonspecific skin eruption: Secondary | ICD-10-CM | POA: Diagnosis not present

## 2018-10-06 DIAGNOSIS — M542 Cervicalgia: Secondary | ICD-10-CM | POA: Diagnosis not present

## 2018-10-06 DIAGNOSIS — I10 Essential (primary) hypertension: Secondary | ICD-10-CM

## 2018-10-06 MED ORDER — KETOCONAZOLE 2 % EX CREA: 1.0000 "application " | TOPICAL_CREAM | Freq: Every day | CUTANEOUS | 1 refills | Status: DC

## 2018-10-06 NOTE — Progress Notes (Signed)
Subjective:    Patient ID: Rebecca Skinner, female    DOB: 1922/11/25, 85 y.o.   MRN: [id number]  DOS:  10/06/2018 Type of visit - description: Virtual Visit via Video Note  I connected with@ on 10/06/18 at 10:40 AM EDT by a video enabled telemedicine application and verified that I am speaking with the correct person using two identifiers.   THIS ENCOUNTER IS A VIRTUAL VISIT DUE TO COVID-19 - PATIENT WAS NOT SEEN IN THE OFFICE. PATIENT HAS CONSENTED TO VIRTUAL VISIT / TELEMEDICINE VISIT   Location of patient: home  Location of provider: office  I discussed the limitations of evaluation and management by telemedicine and the availability of in person appointments. The patient expressed understanding and agreed to proceed.  History of Present Illness: Routine visit I spoke with the patient and her daughter [name]. We review her medications and labs together. I reviewed the cardiology note. She is doing according to the daughter "remarkably well". They are taking very good precautions for COVID prevention. The patient reports neck pain, worse when she turns to the right.  Overall in the last few weeks it has improved.  Denies any numbness in her hands or lower extremities.  No gait problems.    Review of Systems No fever chills No chest pain no lower extremity edema. She has asthma, occasional cough.  Had some wheezing yesterday but not today.  Occasional clear sputum production.   Past Medical History:  Diagnosis Date  . Anxiety 11/20/2011  . Asthma    [facility] Nadean Montanaro  . CAD (coronary artery disease)    s/p CABG s/p AO valve replacement ([facility])  . CVA (cerebral infarction) 08-2013   multiple, L, d/t Afib, started eloquis  . Diabetes mellitus    type 2   . Dizziness    Chronic, admiet 07-2010,saw neuro, thought to be a peripheral issue   . Dry skin   . GERD (gastroesophageal reflux disease)   . Hyperlipidemia   . Hypertension   . Hyperthyroidism   . Keloid     @ chest  . Memory loss   . Osteoarthritis   . Osteopenia    per dexa 12/09  . Paroxysmal atrial fibrillation ([place])    cards d/c coumadin 12/2008 d/t persisten NSR and frequent falls- restarted coumadin july 2011, now on Eliquis  . Recurrent UTI   . Shortness of breath dyspnea   . TIA (transient ischemic attack)     Past Surgical History:  Procedure Laterality Date  . ABDOMINAL HYSTERECTOMY    . AORTIC VALVE REPLACEMENT  1998  . CARDIAC VALVE REPLACEMENT    . CAROTID DOPPLER  07/13/12   BILATERAL BULB/PROXIMAL ICAS;MILD AMOUNT OF FIBROUS PLAQUE WITH NO DIAMETER REDUCTION.  . CESAREAN SECTION     x 2  . COLONOSCOPY WITH PROPOFOL N/A 03/30/2014   Procedure: COLONOSCOPY WITH PROPOFOL;  Surgeon: Irene Shipper, MD;  Location: [facility];  Service: Endoscopy;  Laterality: N/A;  . CORONARY ARTERY BYPASS GRAFT  1998   SVG TO RCA  . HEMORRHOID SURGERY    . JOINT REPLACEMENT    . MYOCARDIAL PERFUSION STUDY  12/19/09   NORMAL PATTERN OF PERFUSION IN ALL REGIONS.EF 75%.  . OOPHORECTOMY    . TOTAL KNEE ARTHROPLASTY  1999  . TRANSTHORACIC ECHOCARDIOGRAM  09/11/11   LVEF >55%.STAGE 1 DIASTOLIC DYSFUNCTION,ELEVATED LV FILLING PRESSURE.BIOPROSTHETIC AORTIC VALVE-PEAK AND MEAN GRADIENTS OF 17 MMHG AND 8 MMHG.SIGMOID SEPTUM.MILD TO MOD MR.MILD TO MOD  TR.RVSP 46 MMHG.    Social History   Socioeconomic History  . Marital status: Divorced    Spouse name: Not on file  . Number of children: 4  . Years of education: Not on file  . Highest education level: Not on file  Occupational History  . Occupation: retired     Fish farm manager: RETIRED  Social Needs  . Financial resource strain: Not on file  . Food insecurity:    Worry: Not on file    Inability: Not on file  . Transportation needs:    Medical: Not on file    Non-medical: Not on file  Tobacco Use  . Smoking status: Former Research scientist (life sciences)  . Smokeless tobacco: Never Used  . Tobacco comment: quit 2004, smoked 1.5 ppd  Substance and Sexual Activity   . Alcohol use: No    Alcohol/week: 0.0 standard drinks  . Drug use: No  . Sexual activity: Not Currently  Lifestyle  . Physical activity:    Days per week: Not on file    Minutes per session: Not on file  . Stress: Not on file  Relationships  . Social connections:    Talks on phone: Not on file    Gets together: Not on file    Attends religious service: Not on file    Active member of club or organization: Not on file    Attends meetings of clubs or organizations: Not on file    Relationship status: Not on file  . Intimate partner violence:    Fear of current or ex partner: Not on file    Emotionally abused: Not on file    Physically abused: Not on file    Forced sexual activity: Not on file  Other Topics Concern  . Not on file  Social History Narrative   Had to moved w/ [name]  ~ 01-2016   Had 3 daughter- 2 son  (lost oldest daughter and son), 2 living daughters in [place] as of 10/06/2018      Reactions   Hydrocodone Itching, Nausea Only   Tramadol Hcl Itching, Nausea Only      Medication List       Accurate as of October 06, 2018 10:09 AM. Always use your most recent med list.        acetaminophen 500 MG tablet Commonly known as:  TYLENOL Take 1,000 mg by mouth every 6 (six) hours as needed (for pain).   albuterol 0.63 MG/3ML nebulizer solution Commonly known as:  ACCUNEB Take 3 mLs by nebulization every 6 (six) hours as needed for wheezing or shortness of breath.   ALPRAZolam 0.25 MG tablet Commonly known as:  XANAX TAKE 1/2 TABLET OR TAKE 1 TABLET BY MOUTH AT NIGHT AS NEEDED FOR INSOMINIA   amLODipine 5 MG tablet Commonly known as:  NORVASC Take 1 tablet (5 mg total) by mouth daily.   apixaban 5 MG Tabs tablet Commonly known as:  Eliquis Take 1 tablet (5 mg total) by mouth 2 (two) times daily.   atorvastatin 40 MG tablet Commonly known as:  LIPITOR Take 1 tablet (40 mg total) by mouth at bedtime.   benazepril 40 MG tablet Commonly  known as:  LOTENSIN Take 1 tablet (40 mg total) by mouth daily.   budesonide 0.5 MG/2ML nebulizer solution Commonly known as:  PULMICORT Take 2 mLs (0.5 mg total) by nebulization 2 (two) times daily.   Calcium 500 + D 500-125 MG-UNIT Tabs Generic drug:  Calcium  Carbonate-Vitamin D Take 1 tablet by mouth at bedtime.   esomeprazole 40 MG capsule Commonly known as:  NEXIUM Take 1 capsule (40 mg total) by mouth daily as needed.   furosemide 20 MG tablet Commonly known as:  LASIX Take 2 tablets (40 mg total) by mouth daily.   isosorbide mononitrate 30 MG 24 hr tablet Commonly known as:  IMDUR TAKE 1 TABLET BY MOUTH EVERY DAY   ketoconazole 2 % cream Commonly known as:  NIZORAL Apply 1 application topically daily.   Klor-Con M10 10 MEQ tablet Generic drug:  potassium chloride TAKE 1 TABLET BY MOUTH EVERY DAY   magnesium oxide 400 MG tablet Commonly known as:  MAG-OX Take 1 tablet (400 mg total) by mouth daily.   methimazole 5 MG tablet Commonly known as:  TAPAZOLE TAKE 1 TABLET BY MOUTH 3 TIMES A WEEK   metoprolol tartrate 50 MG tablet Commonly known as:  LOPRESSOR Take 1 tablet (50 mg total) by mouth 2 (two) times daily.   nitroGLYCERIN 0.4 MG SL tablet Commonly known as:  NITROSTAT Place 1 tablet (0.4 mg total) under the tongue every 5 (five) minutes x 3 doses as needed for chest pain.           Objective:   Physical Exam There were no vitals taken for this visit. This is a video conference, patient is alert oriented x3, she seems to be at baseline.    Assessment     Assessment DM HTN Hyperlipidemia Hyperthyroidism -- used to see Dr. Loanne Drilling, last visit 01-2016, now f/u PCP, check labs x 2 q year GERD Asthma  Anxiety on xanax  CV: Dr. Claiborne Billings --CHF, CAD, CABG, Aortic valve replacement --P- atrial fibrillation, used to be on Coumadin, Eliquis rx after the stroke 3-15, d/c 03-2014 d/t GI bleed , on ASA 81. Dx TIA 08-2016, back on eliquis, asa d/c. ASA  restarted after admission w/ CP 06-2017 [facility]UR[facility] --TIAs (admited 08-2016), CVA 2015 --Dementia MMSE ~ 12 to 15  (12-2016) DJD Osteopenia 2009 Recurrent UTIs Daughter  Royetta Probus; lives w/ Nash Dimmer DM: Diet controlled. HTN: Ambulatory BPs in the 140s to 70.  Currently on amlodipine, Lotensin, Lasix, potassium supplements, metoprolol.  Last BMP satisfactory.  I recommend no change at this point, BP could be a little better but then increasing her medications may create more problems.  No change.  Continue monitoring. Anxiety: On Xanax as needed Asthma: Good compliance with nebulizations.  No fever chills, occasional wheezing.  Recommend to continue present care, okay to add Robitussin-DM, call if symptoms progress or if she has fever. Neck pain: As described above, no evidence of radiculopathy or myelopathy.  Encouraged patient to call if she has any numbness or difficulty with her gait. Rash: Chronic, on the abdomen, prescription for Nizoral to use as needed sent. CHF, CAD, aortic valve replacement: Last visit with cardiology 07-2018, she was felt to be doing well. Ideally, I would bring patient for blood work but given her age and the COVID-19 situation I think is safer for her to stay home. RTC 3 months, my staff will call and arrange.    I discussed the assessment and treatment plan with the patient. The patient was provided an opportunity to ask questions and all were answered. The patient agreed with the plan and demonstrated an understanding of the instructions.   The patient was advised to call back or seek an in-person evaluation if the symptoms worsen or if the condition fails to improve  as anticipated.

## 2018-10-07 NOTE — Assessment & Plan Note (Signed)
DM: Diet controlled. HTN: Ambulatory BPs in the 140s to 70.  Currently on amlodipine, Lotensin, Lasix, potassium supplements, metoprolol.  Last BMP satisfactory.  I recommend no change at this point, BP could be a little better but then increasing her medications may create more problems.  No change.  Continue monitoring. Anxiety: On Xanax as needed Asthma: Good compliance with nebulizations.  No fever chills, occasional wheezing.  Recommend to continue present care, okay to add Robitussin-DM, call if symptoms progress or if she has fever. Neck pain: As described above, no evidence of radiculopathy or myelopathy.  Encouraged patient to call if she has any numbness or difficulty with her gait. Rash: Chronic, on the abdomen, prescription for Nizoral to use as needed sent. CHF, CAD, aortic valve replacement: Last visit with cardiology 07-2018, she was felt to be doing well. Ideally, I would bring patient for blood work but given her age and the COVID-19 situation I think is safer for her to stay home. RTC 3 months, my staff will call and arrange.

## 2018-10-12 ENCOUNTER — Other Ambulatory Visit: Payer: Self-pay | Admitting: Internal Medicine

## 2018-10-14 ENCOUNTER — Ambulatory Visit (INDEPENDENT_AMBULATORY_CARE_PROVIDER_SITE_OTHER): Payer: Medicare Other | Admitting: Internal Medicine

## 2018-10-14 ENCOUNTER — Other Ambulatory Visit: Payer: Self-pay

## 2018-10-14 DIAGNOSIS — R21 Rash and other nonspecific skin eruption: Secondary | ICD-10-CM

## 2018-10-14 MED ORDER — FLUCONAZOLE 150 MG PO TABS: 150.0000 mg | ORAL_TABLET | Freq: Every day | ORAL | 0 refills | Status: DC

## 2018-10-14 MED ORDER — CLOTRIMAZOLE-BETAMETHASONE 1-0.05 % EX CREA: 1.0000 "application " | TOPICAL_CREAM | Freq: Two times a day (BID) | CUTANEOUS | 0 refills | Status: DC

## 2018-10-14 NOTE — Progress Notes (Signed)
Subjective:    Patient ID: Rebecca Skinner, female    DOB: [birth year], 85 y.o.   MRN: [id number]  DOS:  10/14/2018 Type of visit - description: Virtual Visit via Video Note  I connected with@ on 10/14/18 at  3:20 PM EDT by a video enabled telemedicine application and verified that I am speaking with the correct person using two identifiers.   THIS ENCOUNTER IS A VIRTUAL VISIT DUE TO COVID-19 - PATIENT WAS NOT SEEN IN THE OFFICE. PATIENT HAS CONSENTED TO VIRTUAL VISIT / TELEMEDICINE VISIT   Location of patient: home  Location of provider: office  I discussed the limitations of evaluation and management by telemedicine and the availability of in person appointments. The patient expressed understanding and agreed to proceed.  History of Present Illness:   Acute visit Information obtained from the patient and her daughter 3 days ago started to complain w/ a burning and pain at the vaginal area after she wipe. Also they noted that the skin on the left groin is slightly red and "raw". Otherwise she is at her baseline.   Review of Systems No fever chills No nausea, vomiting.  No abdominal pain No cough No dysuria per se No vaginal discharge or vaginal bleeding  Past Medical History:  Diagnosis Date  . Anxiety 11/20/2011  . Asthma    [facility] Keyontae Huckeby  . CAD (coronary artery disease)    s/p CABG s/p AO valve replacement ([facility])  . CVA (cerebral infarction) 08-2013   multiple, L, d/t Afib, started eloquis  . Diabetes mellitus    type 2   . Dizziness    Chronic, admiet 07-2010,saw neuro, thought to be a peripheral issue   . Dry skin   . GERD (gastroesophageal reflux disease)   . Hyperlipidemia   . Hypertension   . Hyperthyroidism   . Keloid    @ chest  . Memory loss   . Osteoarthritis   . Osteopenia    per dexa 12/09  . Paroxysmal atrial fibrillation ([place])    cards d/c coumadin 12/2008 d/t persisten NSR and frequent falls- restarted coumadin july 2011, now on  Eliquis  . Recurrent UTI   . Shortness of breath dyspnea   . TIA (transient ischemic attack)     Past Surgical History:  Procedure Laterality Date  . ABDOMINAL HYSTERECTOMY    . AORTIC VALVE REPLACEMENT  1998  . CARDIAC VALVE REPLACEMENT    . CAROTID DOPPLER  07/13/12   BILATERAL BULB/PROXIMAL ICAS;MILD AMOUNT OF FIBROUS PLAQUE WITH NO DIAMETER REDUCTION.  . CESAREAN SECTION     x 2  . COLONOSCOPY WITH PROPOFOL N/A 03/30/2014   Procedure: COLONOSCOPY WITH PROPOFOL;  Surgeon: Irene Shipper, MD;  Location: [facility];  Service: Endoscopy;  Laterality: N/A;  . CORONARY ARTERY BYPASS GRAFT  1998   SVG TO RCA  . HEMORRHOID SURGERY    . JOINT REPLACEMENT    . MYOCARDIAL PERFUSION STUDY  12/19/09   NORMAL PATTERN OF PERFUSION IN ALL REGIONS.EF 75%.  . OOPHORECTOMY    . TOTAL KNEE ARTHROPLASTY  1999  . TRANSTHORACIC ECHOCARDIOGRAM  09/11/11   LVEF >55%.STAGE 1 DIASTOLIC DYSFUNCTION,ELEVATED LV FILLING PRESSURE.BIOPROSTHETIC AORTIC VALVE-PEAK AND MEAN GRADIENTS OF 17 MMHG AND 8 MMHG.SIGMOID SEPTUM.MILD TO MOD MR.MILD TO MOD TR.RVSP 46 MMHG.    Social History   Socioeconomic History  . Marital status: Divorced    Spouse name: Not on file  . Number of children: 4  .  Years of education: Not on file  . Highest education level: Not on file  Occupational History  . Occupation: retired     Fish farm manager: RETIRED  Social Needs  . Financial resource strain: Not on file  . Food insecurity:    Worry: Not on file    Inability: Not on file  . Transportation needs:    Medical: Not on file    Non-medical: Not on file  Tobacco Use  . Smoking status: Former Research scientist (life sciences)  . Smokeless tobacco: Never Used  . Tobacco comment: quit 2004, smoked 1.5 ppd  Substance and Sexual Activity  . Alcohol use: No    Alcohol/week: 0.0 standard drinks  . Drug use: No  . Sexual activity: Not Currently  Lifestyle  . Physical activity:    Days per week: Not on file    Minutes per session: Not on file  . Stress: Not on  file  Relationships  . Social connections:    Talks on phone: Not on file    Gets together: Not on file    Attends religious service: Not on file    Active member of club or organization: Not on file    Attends meetings of clubs or organizations: Not on file    Relationship status: Not on file  . Intimate partner violence:    Fear of current or ex partner: Not on file    Emotionally abused: Not on file    Physically abused: Not on file    Forced sexual activity: Not on file  Other Topics Concern  . Not on file  Social History Narrative   Had to moved w/ [name]  ~ 01-2016   Had 3 daughter- 2 son  (lost oldest daughter and son), 2 living daughters in [place] as of 10/14/2018      Reactions   Hydrocodone Itching, Nausea Only   Tramadol Hcl Itching, Nausea Only      Medication List       Accurate as of Oct 14, 2018  3:33 PM. If you have any questions, ask your nurse or doctor.        acetaminophen 500 MG tablet Commonly known as:  TYLENOL Take 1,000 mg by mouth every 6 (six) hours as needed (for pain).   albuterol 0.63 MG/3ML nebulizer solution Commonly known as:  ACCUNEB Take 3 mLs by nebulization every 6 (six) hours as needed for wheezing or shortness of breath.   ALPRAZolam 0.25 MG tablet Commonly known as:  XANAX TAKE 1/2 TABLET OR TAKE 1 TABLET BY MOUTH AT NIGHT AS NEEDED FOR INSOMINIA What changed:    how much to take  how to take this  when to take this  reasons to take this  additional instructions   amLODipine 5 MG tablet Commonly known as:  NORVASC Take 1 tablet (5 mg total) by mouth daily.   apixaban 5 MG Tabs tablet Commonly known as:  Eliquis Take 1 tablet (5 mg total) by mouth 2 (two) times daily.   atorvastatin 40 MG tablet Commonly known as:  LIPITOR Take 1 tablet (40 mg total) by mouth at bedtime.   benazepril 40 MG tablet Commonly known as:  LOTENSIN Take 1 tablet (40 mg total) by mouth daily.   budesonide 0.5 MG/2ML  nebulizer solution Commonly known as:  PULMICORT Take 2 mLs (0.5 mg total) by nebulization 2 (two) times daily.   Calcium 500 + D 500-125 MG-UNIT Tabs Generic drug:  Calcium Carbonate-Vitamin D  Take 1 tablet by mouth at bedtime.   esomeprazole 40 MG capsule Commonly known as:  NEXIUM Take 1 capsule (40 mg total) by mouth daily as needed. What changed:  reasons to take this   furosemide 20 MG tablet Commonly known as:  LASIX Take 2 tablets (40 mg total) by mouth daily.   isosorbide mononitrate 30 MG 24 hr tablet Commonly known as:  IMDUR TAKE 1 TABLET BY MOUTH EVERY DAY   ketoconazole 2 % cream Commonly known as:  NIZORAL Apply 1 application topically daily.   Klor-Con M10 10 MEQ tablet Generic drug:  potassium chloride TAKE 1 TABLET BY MOUTH EVERY DAY   magnesium oxide 400 MG tablet Commonly known as:  MAG-OX Take 1 tablet (400 mg total) by mouth daily.   methimazole 5 MG tablet Commonly known as:  TAPAZOLE TAKE 1 TABLET BY MOUTH 3 TIMES A WEEK   metoprolol tartrate 50 MG tablet Commonly known as:  LOPRESSOR Take 1 tablet (50 mg total) by mouth 2 (two) times daily.   nitroGLYCERIN 0.4 MG SL tablet Commonly known as:  NITROSTAT Place 1 tablet (0.4 mg total) under the tongue every 5 (five) minutes x 3 doses as needed for chest pain.           Objective:   Physical Exam There were no vitals taken for this visit. 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    Assessment     Assessment DM HTN Hyperlipidemia Hyperthyroidism -- used to see Dr. Loanne Drilling, last visit 01-2016, now f/u PCP, check labs x 2 q year GERD Asthma  Anxiety on xanax  CV: Dr. Claiborne Billings --CHF, CAD, CABG, Aortic valve replacement --P- atrial fibrillation, used to be on Coumadin, Eliquis rx after the stroke 3-15, d/c 03-2014 d/t GI bleed , on ASA 81. Dx TIA 08-2016, back on eliquis, asa d/c. ASA restarted after admission w/ CP 06-2017 [facility] --TIAs (admited 08-2016),  CVA 2015 --Dementia MMSE ~ 12 to 15  (12-2016) DJD Osteopenia 2009 Recurrent UTIs Daughter  Bessye Stith; lives w/ [name]  PLAN Rash: The patient and her daughter know the limitations of a virtual evaluation, by description I suspect she has tinea cruris and probably vulvar vaginal infection. Plan: Mycolog-II twice a day Diflucan x2 Call if not better or if she has different symptoms (vaginal bleeding, discharge or fever chills)     I discussed the assessment and treatment plan with the patient. The patient was provided an opportunity to ask questions and all were answered. The patient agreed with the plan and demonstrated an understanding of the instructions.   The patient was advised to call back or seek an in-person evaluation if the symptoms worsen or if the condition fails to improve as anticipated.

## 2018-10-16 NOTE — Assessment & Plan Note (Signed)
Rash: The patient and her daughter know the limitations of a virtual evaluation, by description I suspect she has tinea cruris and probably vulvar vaginal infection. Plan: Mycolog-II twice a day Diflucan x2 Call if not better or if she has different symptoms (vaginal bleeding, discharge or fever chills)

## 2018-10-19 ENCOUNTER — Other Ambulatory Visit: Payer: Self-pay | Admitting: Internal Medicine

## 2018-11-11 ENCOUNTER — Other Ambulatory Visit: Payer: Self-pay | Admitting: Internal Medicine

## 2018-11-11 NOTE — Telephone Encounter (Signed)
Requesting: Xanax Contract: 10/15/2012 UDS: 10/14/2012, low risk Last OV: 10/14/2018 Next OV: 01/04/2019 Last Refill: 07/21/2017, #30--0 RF Database:   Please advise

## 2018-11-11 NOTE — Telephone Encounter (Signed)
Sent!

## 2018-11-12 ENCOUNTER — Other Ambulatory Visit: Payer: Self-pay | Admitting: Internal Medicine

## 2018-12-07 ENCOUNTER — Other Ambulatory Visit: Payer: Self-pay

## 2018-12-07 ENCOUNTER — Ambulatory Visit: Payer: Self-pay

## 2018-12-07 NOTE — Telephone Encounter (Signed)
Pts daughter [name] called to report BP elevation. [name] stated that her BP's starting going up last week. This morning her BP's were 176/88 and 145/73. Pt has not missed any doses of her BP medication. Pt denies any neurological or breathing issues. Daughter requesting appt. Care advice given to daughter [name] and daughter verbalized understanding. Call transferred to office to schedule appt.  Reason for Disposition . Systolic BP  >= 702 OR Diastolic >= 637  Answer Assessment - Initial Assessment Questions 1. BLOOD PRESSURE: "What is the blood pressure?" "Did you take at least two measurements 5 minutes apart?"     176/88 145/73 2. ONSET: "When did you take your blood pressure?"     This morning and at 1000 3. HOW: "How did you obtain the blood pressure?" (e.g., visiting nurse, automatic home BP monitor)     Automatic cuff BP monitor 4. HISTORY: "Do you have a history of high blood pressure?"     yes 5. MEDICATIONS: "Are you taking any medications for blood pressure?" "Have you missed any doses recently?"     Yes- no 6. OTHER SYMPTOMS: "Do you have any symptoms?" (e.g., headache, chest pain, blurred vision, difficulty breathing, weakness)     no 7. PREGNANCY: "Is there any chance you are pregnant?" "When was your last menstrual period?"     n/a  Protocols used: HIGH BLOOD PRESSURE-A-AH

## 2018-12-08 ENCOUNTER — Encounter: Payer: Self-pay | Admitting: Internal Medicine

## 2018-12-08 ENCOUNTER — Ambulatory Visit (INDEPENDENT_AMBULATORY_CARE_PROVIDER_SITE_OTHER): Payer: Medicare Other | Admitting: Internal Medicine

## 2018-12-08 VITALS — BP 158/71 | HR 77 | Temp 97.6°F | Resp 18 | Ht 62.0 in | Wt 167.1 lb

## 2018-12-08 DIAGNOSIS — E0590 Thyrotoxicosis, unspecified without thyrotoxic crisis or storm: Secondary | ICD-10-CM | POA: Diagnosis not present

## 2018-12-08 DIAGNOSIS — E1159 Type 2 diabetes mellitus with other circulatory complications: Secondary | ICD-10-CM

## 2018-12-08 DIAGNOSIS — I10 Essential (primary) hypertension: Secondary | ICD-10-CM

## 2018-12-08 DIAGNOSIS — E785 Hyperlipidemia, unspecified: Secondary | ICD-10-CM

## 2018-12-08 DIAGNOSIS — F0390 Unspecified dementia without behavioral disturbance: Secondary | ICD-10-CM

## 2018-12-08 MED ORDER — AMLODIPINE BESYLATE 10 MG PO TABS: 10.0000 mg | ORAL_TABLET | Freq: Every day | ORAL | 3 refills | Status: DC

## 2018-12-08 NOTE — Progress Notes (Signed)
Subjective:    Patient ID: Rebecca Skinner, female    DOB: Jul 28, 1922, 85 y.o.   MRN: [id number]  DOS:  12/08/2018 Type of visit - description: Acute Here with her daughter. BPs slightly elevated lately, yesterday 176/88.  There has been times in the last couple weeks that has been as high as  199, 205. Asthma: Essentially asymptomatic, continue with nebulizations? COVID-19: Good compliance with all precautions High cholesterol: Due for labs    Review of Systems Denies fever chills No chest pain no difficulty breathing No lower extremity edema No nausea, vomiting, diarrhea No headache or nosebleeds. Mental status is a stable according to the daughter..  Past Medical History:  Diagnosis Date  . Anxiety 11/20/2011  . Asthma    UNDER THE [facility] DR Nazier Neyhart  . CAD (coronary artery disease)    s/p CABG s/p AO valve replacement ([facility])  . CVA (cerebral infarction) 08-2013   multiple, L, d/t Afib, started eloquis  . Diabetes mellitus    type 2   . Dizziness    Chronic, admiet 07-2010,saw neuro, thought to be a peripheral issue   . Dry skin   . GERD (gastroesophageal reflux disease)   . Hyperlipidemia   . Hypertension   . Hyperthyroidism   . Keloid    @ chest  . Memory loss   . Osteoarthritis   . Osteopenia    per dexa 12/09  . Paroxysmal atrial fibrillation ([place])    cards d/c coumadin 12/2008 d/t persisten NSR and frequent falls- restarted coumadin july 2011, now on Eliquis  . Recurrent UTI   . Shortness of breath dyspnea   . TIA (transient ischemic attack)     Past Surgical History:  Procedure Laterality Date  . ABDOMINAL HYSTERECTOMY    . AORTIC VALVE REPLACEMENT  1998  . CARDIAC VALVE REPLACEMENT    . CAROTID DOPPLER  07/13/12   BILATERAL BULB/PROXIMAL ICAS;MILD AMOUNT OF FIBROUS PLAQUE WITH NO DIAMETER REDUCTION.  . CESAREAN SECTION     x 2  . COLONOSCOPY WITH PROPOFOL N/A 03/30/2014   Procedure: COLONOSCOPY WITH PROPOFOL;  Surgeon: Irene Shipper, MD;   Location: [facility];  Service: Endoscopy;  Laterality: N/A;  . CORONARY ARTERY BYPASS GRAFT  1998   SVG TO RCA  . HEMORRHOID SURGERY    . JOINT REPLACEMENT    . MYOCARDIAL PERFUSION STUDY  12/19/09   NORMAL PATTERN OF PERFUSION IN ALL REGIONS.EF 75%.  . OOPHORECTOMY    . TOTAL KNEE ARTHROPLASTY  1999  . TRANSTHORACIC ECHOCARDIOGRAM  09/11/11   LVEF >55%.STAGE 1 DIASTOLIC DYSFUNCTION,ELEVATED LV FILLING PRESSURE.BIOPROSTHETIC AORTIC VALVE-PEAK AND MEAN GRADIENTS OF 17 MMHG AND 8 MMHG.SIGMOID SEPTUM.MILD TO MOD MR.MILD TO MOD TR.RVSP 46 MMHG.    Social History   Socioeconomic History  . Marital status: Divorced    Spouse name: Not on file  . Number of children: 4  . Years of education: Not on file  . Highest education level: Not on file  Occupational History  . Occupation: retired     Fish farm manager: RETIRED  Social Needs  . Financial resource strain: Not on file  . Food insecurity    Worry: Not on file    Inability: Not on file  . Transportation needs    Medical: Not on file    Non-medical: Not on file  Tobacco Use  . Smoking status: Former Research scientist (life sciences)  . Smokeless tobacco: Never Used  . Tobacco comment: quit 2004, smoked 1.5 ppd  Substance  and Sexual Activity  . Alcohol use: No    Alcohol/week: 0.0 standard drinks  . Drug use: No  . Sexual activity: Not Currently  Lifestyle  . Physical activity    Days per week: Not on file    Minutes per session: Not on file  . Stress: Not on file  Relationships  . Social Herbalist on phone: Not on file    Gets together: Not on file    Attends religious service: Not on file    Active member of club or organization: Not on file    Attends meetings of clubs or organizations: Not on file    Relationship status: Not on file  . Intimate partner violence    Fear of current or ex partner: Not on file    Emotionally abused: Not on file    Physically abused: Not on file    Forced sexual activity: Not on file  Other Topics Concern   . Not on file  Social History Narrative   Had to moved w/ [name]  ~ 01-2016   Had 3 daughter- 2 son  (lost oldest daughter and son), 2 living daughters in [place] as of 12/08/2018      Reactions   Hydrocodone Itching, Nausea Only   Tramadol Hcl Itching, Nausea Only      Medication List       Accurate as of December 08, 2018 11:44 AM. If you have any questions, ask your nurse or doctor.        acetaminophen 500 MG tablet Commonly known as: TYLENOL Take 1,000 mg by mouth every 6 (six) hours as needed (for pain).   albuterol 0.63 MG/3ML nebulizer solution Commonly known as: ACCUNEB Take 3 mLs by nebulization every 6 (six) hours as needed for wheezing or shortness of breath.   ALPRAZolam 0.25 MG tablet Commonly known as: XANAX TAKE 1/2 TABLET OR TAKE 1 TABLET BY MOUTH AT NIGHT AS NEEDED FOR INSOMINIA   amLODipine 5 MG tablet Commonly known as: NORVASC Take 1 tablet (5 mg total) by mouth daily.   apixaban 5 MG Tabs tablet Commonly known as: Eliquis Take 1 tablet (5 mg total) by mouth 2 (two) times daily.   atorvastatin 40 MG tablet Commonly known as: LIPITOR Take 1 tablet (40 mg total) by mouth at bedtime.   benazepril 40 MG tablet Commonly known as: LOTENSIN TAKE 1 TABLET BY MOUTH EVERY DAY   budesonide 0.5 MG/2ML nebulizer solution Commonly known as: PULMICORT Take 2 mLs (0.5 mg total) by nebulization 2 (two) times daily.   Calcium 500 + D 500-125 MG-UNIT Tabs Generic drug: Calcium Carbonate-Vitamin D Take 1 tablet by mouth at bedtime.   clotrimazole-betamethasone cream Commonly known as: LOTRISONE Apply 1 application topically 2 (two) times daily.   esomeprazole 40 MG capsule Commonly known as: NEXIUM Take 1 capsule (40 mg total) by mouth daily as needed. What changed: reasons to take this   fluconazole 150 MG tablet Commonly known as: DIFLUCAN Take 1 tablet (150 mg total) by mouth daily.   furosemide 20 MG tablet Commonly known as: LASIX  Take 2 tablets (40 mg total) by mouth daily.   isosorbide mononitrate 30 MG 24 hr tablet Commonly known as: IMDUR TAKE 1 TABLET BY MOUTH EVERY DAY   ketoconazole 2 % cream Commonly known as: NIZORAL Apply 1 application topically daily.   Klor-Con M10 10 MEQ tablet Generic drug: potassium chloride TAKE 1 TABLET BY MOUTH  EVERY DAY   magnesium oxide 400 MG tablet Commonly known as: MAG-OX Take 1 tablet (400 mg total) by mouth daily.   methimazole 5 MG tablet Commonly known as: TAPAZOLE TAKE 1 TABLET BY MOUTH 3 TIMES A WEEK   metoprolol tartrate 50 MG tablet Commonly known as: LOPRESSOR Take 1 tablet (50 mg total) by mouth 2 (two) times daily.   nitroGLYCERIN 0.4 MG SL tablet Commonly known as: NITROSTAT Place 1 tablet (0.4 mg total) under the tongue every 5 (five) minutes x 3 doses as needed for chest pain.           Objective:   Physical Exam BP (!) 158/71 (BP Location: Left Arm, Patient Position: Sitting, Cuff Size: Small)   Pulse 77   Temp 97.6 F (36.4 C) (Oral)   Resp 18   Ht 5\' 2"  (1.575 m)   Wt 167 lb 2 oz (75.8 kg)   SpO2 98%   BMI 30.57 kg/m  General:   Well developed, NAD, BMI noted. HEENT:  Normocephalic . Face symmetric, atraumatic Lungs:  CTA B Normal respiratory effort, no intercostal retractions, no accessory muscle use. Heart: irreg,  + syst o murmur.  No pretibial edema bilaterally  Skin: Not pale. Not jaundice Neurologic:  alert & oriented to self. Speech normal, gait assisted by Rollator.  Appropriate for age. Psych--  Behavior appropriate. No anxious or depressed appearing.      Assessment      Assessment DM HTN Hyperlipidemia Hyperthyroidism -- used to see Dr. Loanne Drilling, last visit 01-2016, now f/u PCP, check labs x 2 q year GERD Asthma  Anxiety on xanax  CV: Dr. Claiborne Billings --CHF, CAD, CABG, Aortic valve replacement --P- atrial fibrillation, used to be on Coumadin, Eliquis rx after the stroke 3-15, d/c 03-2014 d/t GI bleed , on  ASA 81. Dx TIA 08-2016, back on eliquis, asa d/c.   NEURO --TIAs (admited 08-2016), CVA 2015 --Dementia MMSE ~ 12 to 15  (12-2016) DJD Osteopenia 2009 Recurrent UTIs Daughter  Nichol Ator; lives w/ [name]  PLAN HTN: Needs better control, occasionally ambulatory BPs in the 200s. Plan: Increase amlodipine to 10 mg, continue with Lotensin, Lasix, Imdur, potassium, metoprolol.  Check a CMP and CBC. Advised daughter to continue checking ambulatory BPs and call if not at goal.  See AVS. DM: Check A1c.  Diet controlled Hyperlipidemia: On Lipitor.  Check a FLP Asthma: Essentially asymptomatic, okay to switch nebulizers to as needed only for now. Dementia: Stable. Hyperthyroidism: On Tapazole, checking LFTs, TSH and T4 RTC 4 months

## 2018-12-08 NOTE — Patient Instructions (Signed)
   GO TO THE FRONT DESK Schedule labs to be done within few days, fastin  Schedule  your next appointment for a 4 months follow up     Check the  blood pressure  daily Be sure your blood pressure is between 110/65 and  145/85. If it is consistently higher or lower, let me know   Increase amlodipine to 10 mg daily,  All other medications are the same

## 2018-12-08 NOTE — Assessment & Plan Note (Addendum)
HTN: Needs better control, occasionally ambulatory BPs in the 200s. Plan: Increase amlodipine to 10 mg, continue with Lotensin, Lasix, Imdur, potassium, metoprolol.  Check a CMP and CBC. Advised daughter to continue checking ambulatory BPs and call if not at goal.  See AVS. DM: Check A1c.  Diet controlled Hyperlipidemia: On Lipitor.  Check a FLP Asthma: Essentially asymptomatic, okay to switch nebulizers to as needed only for now. Dementia: Stable. Hyperthyroidism: On Tapazole, checking LFTs, TSH and T4 RTC 4 months

## 2018-12-08 NOTE — Progress Notes (Signed)
Pre visit review using our clinic review tool, if applicable. No additional management support is needed unless otherwise documented below in the visit note. 

## 2018-12-10 ENCOUNTER — Other Ambulatory Visit: Payer: Self-pay

## 2018-12-14 ENCOUNTER — Other Ambulatory Visit (INDEPENDENT_AMBULATORY_CARE_PROVIDER_SITE_OTHER): Payer: Medicare Other

## 2018-12-14 ENCOUNTER — Other Ambulatory Visit: Payer: Self-pay

## 2018-12-14 DIAGNOSIS — I10 Essential (primary) hypertension: Secondary | ICD-10-CM

## 2018-12-14 DIAGNOSIS — E0590 Thyrotoxicosis, unspecified without thyrotoxic crisis or storm: Secondary | ICD-10-CM

## 2018-12-14 DIAGNOSIS — E1159 Type 2 diabetes mellitus with other circulatory complications: Secondary | ICD-10-CM | POA: Diagnosis not present

## 2018-12-14 DIAGNOSIS — E785 Hyperlipidemia, unspecified: Secondary | ICD-10-CM

## 2018-12-14 LAB — LIPID PANEL
Cholesterol: 97 mg/dL (ref 0–200)
HDL: 39.4 mg/dL (ref >39.00)
LDL Cholesterol: 44 mg/dL (ref 0–99)
NonHDL: 57.94
Total CHOL/HDL Ratio: 2
Triglycerides: 69 mg/dL (ref 0.0–149.0)
VLDL: 13.8 mg/dL (ref 0.0–40.0)

## 2018-12-14 LAB — CBC WITH DIFFERENTIAL/PLATELET
Basophils Absolute: 0 10*3/uL (ref 0.0–0.1)
Basophils Relative: 0.5 % (ref 0.0–3.0)
Eosinophils Absolute: 0.1 10*3/uL (ref 0.0–0.7)
Eosinophils Relative: 2.6 % (ref 0.0–5.0)
HCT: 38.1 % (ref 36.0–46.0)
Hemoglobin: 12.5 g/dL (ref 12.0–15.0)
Lymphocytes Relative: 27.6 % (ref 12.0–46.0)
Lymphs Abs: 1.3 10*3/uL (ref 0.7–4.0)
MCHC: 32.7 g/dL (ref 30.0–36.0)
MCV: 100.4 fl — ABNORMAL HIGH (ref 78.0–100.0)
Monocytes Absolute: 0.7 10*3/uL (ref 0.1–1.0)
Monocytes Relative: 13.9 % — ABNORMAL HIGH (ref 3.0–12.0)
Neutro Abs: 2.7 10*3/uL (ref 1.4–7.7)
Neutrophils Relative %: 55.4 % (ref 43.0–77.0)
Platelets: 140 10*3/uL — ABNORMAL LOW (ref 150.0–400.0)
RBC: 3.8 Mil/uL — ABNORMAL LOW (ref 3.87–5.11)
RDW: 14.7 % (ref 11.5–15.5)
WBC: 4.8 10*3/uL (ref 4.0–10.5)

## 2018-12-14 LAB — COMPREHENSIVE METABOLIC PANEL
ALT: 13 U/L (ref 0–35)
AST: 16 U/L (ref 0–37)
Albumin: 4 g/dL (ref 3.5–5.2)
Alkaline Phosphatase: 81 U/L (ref 39–117)
BUN: 16 mg/dL (ref 6–23)
CO2: 30 mEq/L (ref 19–32)
Calcium: 8.8 mg/dL (ref 8.4–10.5)
Chloride: 103 mEq/L (ref 96–112)
Creatinine, Ser: 1.03 mg/dL (ref 0.40–1.20)
GFR: 60.39 mL/min (ref >60.00)
Glucose, Bld: 114 mg/dL — ABNORMAL HIGH (ref 70–99)
Potassium: 4.4 mEq/L (ref 3.5–5.1)
Sodium: 143 mEq/L (ref 135–145)
Total Bilirubin: 0.5 mg/dL (ref 0.2–1.2)
Total Protein: 6.6 g/dL (ref 6.0–8.3)

## 2018-12-14 LAB — T4, FREE: Free T4: 0.75 ng/dL (ref 0.60–1.60)

## 2018-12-14 LAB — TSH: TSH: 1.94 u[IU]/mL (ref 0.35–4.50)

## 2018-12-14 LAB — HEMOGLOBIN A1C: Hgb A1c MFr Bld: 6.9 % — ABNORMAL HIGH (ref 4.6–6.5)

## 2018-12-16 ENCOUNTER — Other Ambulatory Visit: Payer: Self-pay

## 2018-12-16 NOTE — Patient Outreach (Signed)
Rebecca Skinner [facility]) Care Management  12/16/2018  Rebecca Skinner 1922/12/04 [id number]    Telephone Screen Referral Date :12/03/2018 Referral Source:[employer] High Risk Referral Reason: Screening for possible needs Insurance:UHC  Return call from Rebecca Skinner [facility]) the patients daughter.  HIPAA verified by Rebecca Skinner.  Explained to the patient health coach role and [facility] services.  Caregiver is open to the call and verbally agreed to services.      Social: The patient lives in the home with her daughter Rebecca Skinner.  Rebecca Skinner is very supportive of her mother.  The caregiver helps her daily needs.  Caregiver takes her to her appointments.  Equipment in the home include:  Blood pressure cuff, scales, walker, shower chair, glasses, and dentures.  Conditions: Per caregiver her conditions consists of A-FIB, CHF and HTN.  The caregiver said that her mother's blood pressures fluctuate some but mostly stay controlled.  She does not weigh daily but caregiver feels that she may benefit from education.    Medications: Per caregiver the patient is on nine medications.  She manges the medications for her.  She does not express any problems paying for medications   Plan: [place] will send Welcome and Health Coach letter.   Lazaro Arms RN, BSN, [place] Direct Dial:  [phone] Fax: [phone]

## 2018-12-16 NOTE — Patient Outreach (Signed)
[place] [facility]) Care Management  12/16/2018  Rebecca Skinner July 10, 1922 [id number]    Telephone Screen Referral Date :12/03/2018 Referral Source:[employer]HC High Risk Referral Reason: Screening for possible needs Insurance:UHC  Outreach attempt # 1 To patient.  No answer.  HI{PAA compliant voicemail left with contact information.  Plan: RN Health Coach will send letter. [place] will make outreach attempt to the patient within four business days.  Lazaro Arms RN, BSN, [place] Direct Dial:  [phone] Fax: [phone]

## 2018-12-16 NOTE — Telephone Encounter (Signed)
This encounter was created in error - please disregard.

## 2018-12-19 ENCOUNTER — Other Ambulatory Visit: Payer: Self-pay | Admitting: Cardiology

## 2018-12-19 ENCOUNTER — Other Ambulatory Visit: Payer: Self-pay | Admitting: Internal Medicine

## 2018-12-19 DIAGNOSIS — J45909 Unspecified asthma, uncomplicated: Secondary | ICD-10-CM

## 2018-12-23 ENCOUNTER — Telehealth: Payer: Self-pay

## 2018-12-23 DIAGNOSIS — Z471 Aftercare following joint replacement surgery: Secondary | ICD-10-CM | POA: Diagnosis not present

## 2018-12-23 DIAGNOSIS — Z96652 Presence of left artificial knee joint: Secondary | ICD-10-CM | POA: Diagnosis not present

## 2018-12-23 DIAGNOSIS — M1990 Unspecified osteoarthritis, unspecified site: Secondary | ICD-10-CM

## 2018-12-23 DIAGNOSIS — T8454XA Infection and inflammatory reaction due to internal left knee prosthesis, initial encounter: Secondary | ICD-10-CM | POA: Diagnosis not present

## 2018-12-23 NOTE — Telephone Encounter (Signed)
Okay to proceed with labs, DX DJD

## 2018-12-23 NOTE — Telephone Encounter (Signed)
Copied from [place] [id number]. Topic: General - Other >> Dec 23, 2018  2:55 PM Nils Flack, Marland Kitchen wrote: Reason for CRM:  pt went to ortho today, and they are wanting to get labs done.  They could not do them at that office due to the lab being closed.  Daughter is asking for Dr Larose Kells to put in orders so they dont have to go to a drawing station  C reactive protien Sed rate Cbc include diff/tlt Cb is [phone]

## 2018-12-23 NOTE — Telephone Encounter (Signed)
Please advise 

## 2018-12-24 ENCOUNTER — Other Ambulatory Visit: Payer: Self-pay

## 2018-12-24 NOTE — Patient Outreach (Signed)
[place] [name]) Care Management  12/24/2018  Rebecca Skinner Oct 26, 1922 [id number]    1st unsuccessful outreach for initial assessment.  Christean Grief daughter answer the phone and stated that she had been to the doctor this morning and was not feeling well and would not be able to complete the assessment today.  Plan: RN Health Coach will make outreach attempt to the patient within thirty business  Days.  Lazaro Arms RN, BSN, [place] Direct Dial:  [phone]  Fax: [phone]

## 2018-12-24 NOTE — Telephone Encounter (Signed)
Done

## 2018-12-24 NOTE — Telephone Encounter (Signed)
Orders placed- please call Pt's daughter to schedule lab appt. Thank you.

## 2018-12-29 ENCOUNTER — Other Ambulatory Visit: Payer: Self-pay

## 2018-12-29 NOTE — Patient Outreach (Signed)
[place]) Care Management  12/29/2018  Rebecca Skinner 08/08/1922 [id number]    2nd attempt to outreach the patient for initial assessment.  No answer.  HIPAA compliant voicemail left with contact information.  Plan: RN Health Coach will make outreach attempt to the patient within thirty business  days.   Lazaro Arms RN, BSN, [place] Direct Dial:  [phone]  Fax: [phone]

## 2018-12-30 ENCOUNTER — Ambulatory Visit: Payer: Medicare Other | Admitting: Internal Medicine

## 2018-12-31 ENCOUNTER — Other Ambulatory Visit (INDEPENDENT_AMBULATORY_CARE_PROVIDER_SITE_OTHER): Payer: Medicare Other

## 2018-12-31 ENCOUNTER — Other Ambulatory Visit: Payer: Self-pay

## 2018-12-31 DIAGNOSIS — M1990 Unspecified osteoarthritis, unspecified site: Secondary | ICD-10-CM

## 2018-12-31 LAB — CBC WITH DIFFERENTIAL/PLATELET
Basophils Absolute: 0 10*3/uL (ref 0.0–0.1)
Basophils Relative: 0.5 % (ref 0.0–3.0)
Eosinophils Absolute: 0.1 10*3/uL (ref 0.0–0.7)
Eosinophils Relative: 1.4 % (ref 0.0–5.0)
HCT: 37.8 % (ref 36.0–46.0)
Hemoglobin: 12.4 g/dL (ref 12.0–15.0)
Lymphocytes Relative: 22.8 % (ref 12.0–46.0)
Lymphs Abs: 1.4 10*3/uL (ref 0.7–4.0)
MCHC: 32.9 g/dL (ref 30.0–36.0)
MCV: 99.4 fl (ref 78.0–100.0)
Monocytes Absolute: 0.8 10*3/uL (ref 0.1–1.0)
Monocytes Relative: 13.6 % — ABNORMAL HIGH (ref 3.0–12.0)
Neutro Abs: 3.8 10*3/uL (ref 1.4–7.7)
Neutrophils Relative %: 61.7 % (ref 43.0–77.0)
Platelets: 164 10*3/uL (ref 150.0–400.0)
RBC: 3.8 Mil/uL — ABNORMAL LOW (ref 3.87–5.11)
RDW: 14.3 % (ref 11.5–15.5)
WBC: 6.1 10*3/uL (ref 4.0–10.5)

## 2018-12-31 LAB — SEDIMENTATION RATE: Sed Rate: 35 mm/hr — ABNORMAL HIGH (ref 0–30)

## 2018-12-31 LAB — C-REACTIVE PROTEIN: CRP: <1 mg/dL (ref 0.5–20.0)

## 2019-01-04 ENCOUNTER — Ambulatory Visit: Payer: Medicare Other | Admitting: Internal Medicine

## 2019-01-09 ENCOUNTER — Other Ambulatory Visit: Payer: Self-pay | Admitting: Internal Medicine

## 2019-01-19 ENCOUNTER — Other Ambulatory Visit: Payer: Self-pay

## 2019-01-19 NOTE — Patient Outreach (Signed)
[place] [facility]) Care Management  01/19/2019   THANA RAMP 10-14-1922 [id number]       Outreach attempt # 3 to the patient for initial assessment.  HIPAA provided by daughter [name] ( on ROI).  The assessment was completed with [name] and the patient.    Social: The patient lives in the home with her spouse and daughter [name].  [name] is very supportive of her mother and is her caregiver.  The caregiver helps her with daily needs.  Caregiver takes her to her appointments.  The patient states that she has pain in her left knee and rates the pain at 6/10.  She has medication that she will take for the pain.  The caregiver reports that the patient has had two falls in the last year.  She did not injure herself.  She lost her balance.  Falls education was provided.  Equipment in the home include:  Blood pressure cuff, scales, walker, shower chair, glasses, and dentures.   Conditions: Per chart review and conversation with the caregiver the patient medical conditions consist of:  HTN, CAD, A-Fib, CHF, TIA, Asthma, Hyperthyroidism,Diabetes mellitus with circulatory complications, Dementia, Dyslipidemia and S/P AVR.  The caregiver states that she does not weigh daily. Her weight varies between 166-168 lbs. [name] states that her mother does not exercise. Caregiver takes her blood pressure daily.  She does not follow any special diet but her food is monitored for salt. The patient does not check her blood sugars it is diet controlled.  Medications:  Per chart review and talking with the caregiver the patient is on eighteen medications.  Caregiver prepares her medications.  Caregiver did not express any problems paying for medications.  Patient takes her medications as prescribed.  Appointments: 01/27/19 Echo, 02/08/19 Almyra Deforest, and 04/08/19 Dr. Larose Kells   Advanced Directives: The patient has a healthcare power of attorney and does not want any additional information.     Current Medications:   Current Outpatient Medications  Medication Sig Dispense Refill  . acetaminophen (TYLENOL) 500 MG tablet Take 1,000 mg by mouth every 6 (six) hours as needed (for pain).     Marland Kitchen albuterol (ACCUNEB) 0.63 MG/3ML nebulizer solution Take 3 mLs by nebulization every 6 (six) hours as needed for wheezing or shortness of breath. 75 mL 5  . ALPRAZolam (XANAX) 0.25 MG tablet TAKE 1/2 TABLET OR TAKE 1 TABLET BY MOUTH AT NIGHT AS NEEDED FOR INSOMINIA 30 tablet 0  . amLODipine (NORVASC) 10 MG tablet Take 1 tablet (10 mg total) by mouth daily. 90 tablet 3  . apixaban (ELIQUIS) 5 MG TABS tablet Take 1 tablet (5 mg total) by mouth 2 (two) times daily. 60 tablet 6  . atorvastatin (LIPITOR) 40 MG tablet Take 1 tablet (40 mg total) by mouth at bedtime. 90 tablet 3  . benazepril (LOTENSIN) 40 MG tablet TAKE 1 TABLET BY MOUTH EVERY DAY 90 tablet 1  . budesonide (PULMICORT) 0.5 MG/2ML nebulizer solution Take 2 mLs (0.5 mg total) by nebulization daily. 120 mL 5  . Calcium Carbonate-Vitamin D (CALCIUM 500 + D) 500-125 MG-UNIT TABS Take 1 tablet by mouth at bedtime.    . clotrimazole-betamethasone (LOTRISONE) cream Apply 1 application topically 2 (two) times daily. 30 g 0  . esomeprazole (NEXIUM) 40 MG capsule Take 1 capsule (40 mg total) by mouth daily as needed. (Patient taking differently: Take 40 mg by mouth daily as needed (for reflux symptoms). ) 90 capsule 3  . furosemide (LASIX) 20 MG  tablet Take 2 tablets (40 mg total) by mouth daily. 180 tablet 1  . isosorbide mononitrate (IMDUR) 30 MG 24 hr tablet TAKE 1 TABLET BY MOUTH EVERY DAY 90 tablet 2  . ketoconazole (NIZORAL) 2 % cream Apply 1 application topically daily. 30 g 1  . magnesium oxide (MAG-OX) 400 MG tablet Take 1 tablet (400 mg total) by mouth daily. 90 tablet 1  . methimazole (TAPAZOLE) 5 MG tablet TAKE 1 TABLET BY MOUTH 3 TIMES A WEEK 30 tablet 5  . metoprolol tartrate (LOPRESSOR) 50 MG tablet Take 1 tablet (50 mg total) by mouth 2 (two) times daily. 180  tablet 1  . nitroGLYCERIN (NITROSTAT) 0.4 MG SL tablet Place 1 tablet (0.4 mg total) under the tongue every 5 (five) minutes x 3 doses as needed for chest pain. 25 tablet 3  . potassium chloride (KLOR-CON M10) 10 MEQ tablet Take 1 tablet (10 mEq total) by mouth daily. 90 tablet 1   No current facility-administered medications for this visit.     Functional Status:  In your present state of health, do you have any difficulty performing the following activities: 01/19/2019 12/08/2018  Hearing? N N  Vision? N N  Difficulty concentrating or making decisions? Y Y  Comment memroy loss -  Walking or climbing stairs? N Y  Dressing or bathing? N Y  Doing errands, shopping? N Y  Conservation officer, nature and eating ? N -  Using the Toilet? N -  In the past six months, have you accidently leaked urine? N -  Do you have problems with loss of bowel control? N -  Managing your Medications? N -  Managing your Finances? N -  Housekeeping or managing your Housekeeping? N -  Some recent data might be hidden    Fall/Depression Screening: Fall Risk  01/19/2019 04/06/2018 02/03/2017  Falls in the past year? 1 No Yes  Number falls in past yr: 1 - 2 or more  Injury with Fall? 0 - No  Risk for fall due to : Impaired balance/gait - -  Risk for fall due to: Comment - - -  Follow up Education provided - -   PHQ 2/9 Scores 01/19/2019 12/16/2018 04/06/2018 02/03/2017 05/20/2016 03/20/2016 09/23/2015  PHQ - 2 Score 0 0 0 0 0 0 0    Assessment: Patient will benefit from health coach outreach for disease management and support.  THN CM Care Plan Problem One     Most Recent Value  Care Plan Problem One  Knowlegde deficit related to disease management.  Role Documenting the Problem One  [place] for Problem One  Active  THN Long Term Goal   In 90 days the caregiver will verbalize no admission or ER visit due to CHF  [facility] Long Term Goal Start Date  01/19/19  Interventions for Problem One Long Term Goal   Reviewed signs and symptoms of heart failure, discussed with patient chf action plan, encouraged patient to weigh daily, reviewed medications encouraged medication compliance. patient will communicate and participate with Health Coach for ongoing reinforcement, educational material will be sent to review.    Plan: RN Health Coach will provide ongoing education for patient on heart failure through phone calls and sending printed information to patient for further discussion.   RN Health Coach will send Booklet on heart failure.   RN Health Coach will send initial barriers letter, assessment, and care plan to primary care physician.   [place] will send Consent form.  Will follow up next month to make sure the patient received the educational material.  [place] will contact patient in the month of September and patient agrees to next outreach.   Lazaro Arms RN, BSN, [place] Direct Dial:  [phone]  Fax: [phone]

## 2019-01-20 ENCOUNTER — Other Ambulatory Visit (HOSPITAL_COMMUNITY): Payer: Self-pay | Admitting: Neurology

## 2019-01-20 DIAGNOSIS — Z96652 Presence of left artificial knee joint: Secondary | ICD-10-CM

## 2019-01-21 ENCOUNTER — Other Ambulatory Visit (HOSPITAL_COMMUNITY): Payer: Self-pay | Admitting: Specialist

## 2019-01-21 DIAGNOSIS — T8454XA Infection and inflammatory reaction due to internal left knee prosthesis, initial encounter: Secondary | ICD-10-CM

## 2019-01-26 ENCOUNTER — Encounter (HOSPITAL_COMMUNITY): Payer: Medicare Other

## 2019-01-27 ENCOUNTER — Other Ambulatory Visit: Payer: Self-pay | Admitting: Internal Medicine

## 2019-01-27 ENCOUNTER — Ambulatory Visit (HOSPITAL_COMMUNITY): Payer: Medicare Other | Attending: Cardiology

## 2019-01-27 ENCOUNTER — Other Ambulatory Visit: Payer: Self-pay

## 2019-01-27 DIAGNOSIS — Z952 Presence of prosthetic heart valve: Secondary | ICD-10-CM | POA: Insufficient documentation

## 2019-01-29 ENCOUNTER — Telehealth: Payer: Self-pay | Admitting: Cardiovascular Disease

## 2019-01-29 NOTE — Telephone Encounter (Signed)
Returned call to pt daughter she states that pt's weight is up 10+# in the last month she states that she noticed today that her legs were quite swollen and she had her weight and noticed that her weight was up. She states that she is SOB more than usual and will increase lasix tomorrow to 60mg  in the am. She will CB if this does not help. She has an appointment scheduled on the 31st I told her that she should not wait until then to call back if the weight does not come off over the weekend she will weigh pt on Sunday and possible call back on Sunday or Monday for further directions. She will take pt to the ER if needed

## 2019-01-29 NOTE — Telephone Encounter (Signed)
Pt c/o swelling: STAT is pt has developed SOB within 24 hours  1) How much weight have you gained and in what time span? 10lbs in 1 month   2) If swelling, where is the swelling located? Swelling in legs   3) Are you currently taking a fluid pill? furosemide (LASIX) 20 MG tablet  4) Are you currently SOB? No, but she is wheezing a lot and very tired   5) Do you have a log of your daily weights (if so, list)? no  6) Have you gained 3 pounds in a day or 5 pounds in a week?   7) Have you traveled recently? No

## 2019-02-01 ENCOUNTER — Other Ambulatory Visit: Payer: Self-pay

## 2019-02-01 ENCOUNTER — Ambulatory Visit (HOSPITAL_COMMUNITY)
Admission: RE | Admit: 2019-02-01 | Discharge: 2019-02-01 | Disposition: A | Discharge status: Home / Self Care | Payer: Medicare Other | Source: Ambulatory Visit | Attending: Specialist | Admitting: Specialist

## 2019-02-01 ENCOUNTER — Encounter (HOSPITAL_COMMUNITY)
Admission: RE | Admit: 2019-02-01 | Discharge: 2019-02-01 | Disposition: A | Discharge status: Home / Self Care | Payer: Medicare Other | Source: Ambulatory Visit | Attending: Specialist | Admitting: Specialist

## 2019-02-01 DIAGNOSIS — T8454XA Infection and inflammatory reaction due to internal left knee prosthesis, initial encounter: Secondary | ICD-10-CM | POA: Insufficient documentation

## 2019-02-01 DIAGNOSIS — M25562 Pain in left knee: Secondary | ICD-10-CM | POA: Diagnosis not present

## 2019-02-01 DIAGNOSIS — Z96652 Presence of left artificial knee joint: Secondary | ICD-10-CM | POA: Diagnosis not present

## 2019-02-01 MED ORDER — TECHNETIUM TC 99M MEDRONATE IV KIT
20.0000 | PACK | Freq: Once | INTRAVENOUS | Status: AC | PRN
  Administered 2019-02-01: 09:00:00 20 via INTRAVENOUS

## 2019-02-04 ENCOUNTER — Ambulatory Visit: Payer: Self-pay

## 2019-02-04 MED ORDER — PREDNISONE 10 MG PO TABS: ORAL_TABLET | ORAL | 0 refills | Status: DC

## 2019-02-04 NOTE — Telephone Encounter (Signed)
Outgoing call to to patient daughter  who request.   Something be call into [facility] [address]  For Patients wheezing.  . It has been on and off for a week or so.  Denies shortness of breath.  States the last time it occurred , Patient was Rx. Prednisone.  Patient daughter awaits return phone call at [phone].         3  Rebecca Skinner Female, 85 y.o., Jan 05, 1923 MRN:  [id number] Phone:  [phone] Jerilynn Mages) PCP:  Colon Branch, MD Primary Cvg:  [facility] 02/08/2019 at 10:30 AM Message from Scherrie Gerlach sent at 02/04/2019 10:59 AM EDT  Daughter [name] calling to ask if the dr will call in anything for the pt for wheezing? She states pt is "wheezing more than normal" No SOB, coughing up white phlegm.  Please advise pt thanks.   Call History   Type Contact  02/04/2019 10:56 AM EDT Phone (Incoming) Diss,Ruth (Emergency Contact)  Phone: [phone]  User: Scherrie Gerlach    Answer Assessment - Initial Assessment Questions 1. RESPIRATORY STATUS: "Describe your breathing?" (e.g., wheezing, shortness of breath, unable to speak, severe coughing)      Wheezing  2. ONSET: "When did this breathing problem begin?"      On and off last week  3. PATTERN "Does the difficult breathing come and go, or has it been constant since it started?"     Comes and goes 4. SEVERITY: "How bad is your breathing?" (e.g., mild, moderate, severe)    - MILD: No SOB at rest, mild SOB with walking, speaks normally in sentences, can lay down, no retractions, pulse < 100.    - MODERATE: SOB at rest, SOB with minimal exertion and prefers to sit, cannot lie down flat, speaks in phrases, mild retractions, audible wheezing, pulse 100-120.    - SEVERE: Very SOB at rest, speaks in single words, struggling to breathe, sitting hunched forward, retractions, pulse > 120      moderately 5. RECURRENT SYMPTOM: "Have you had difficulty breathing before?" If so, ask: "When  was the last time?" and "What happened that time?"      Not first time  Was sent prednisone 6. CARDIAC HISTORY: "Do you have any history of heart disease?" (e.g., heart attack, angina, bypass surgery, angioplasty)      yes 7. LUNG HISTORY: "Do you have any history of lung disease?"  (e.g., pulmonary embolus, asthma, emphysema)     asthma 8. CAUSE: "What do you think is causing the breathing problem?"      *No Answer* 9. OTHER SYMPTOMS: "Do you have any other symptoms? (e.g., dizziness, runny nose, cough, chest pain, fever)     denies 10. PREGNANCY: "Is there any chance you are pregnant?" "When was your last menstrual period?"      na 11. TRAVEL: "Have you traveled out of the country in the last month?" (e.g., travel history, exposures)      na  Protocols used: BREATHING DIFFICULTY-A-AH

## 2019-02-04 NOTE — Telephone Encounter (Signed)
I spoke with the patient's daughter, they could not make an appointment for today. She has been wheezing more than usual for the last week.  Taking albuterol. + Clear sputum but no fever, chills.  No chest pain. Had some leg swelling but it has decreased with leg elevation. She wonders if Ms. Leomia needs a round of prednisone, I agree, prescription sent, continue nebs, also recommend Robitussin call or go to the ER if she is not improving.

## 2019-02-04 NOTE — Telephone Encounter (Signed)
Needs virtual visit 

## 2019-02-05 ENCOUNTER — Ambulatory Visit: Payer: Medicare Other | Admitting: Internal Medicine

## 2019-02-08 ENCOUNTER — Ambulatory Visit: Payer: Medicare Other | Admitting: Physician Assistant

## 2019-02-09 ENCOUNTER — Other Ambulatory Visit: Payer: Self-pay

## 2019-02-09 ENCOUNTER — Ambulatory Visit (INDEPENDENT_AMBULATORY_CARE_PROVIDER_SITE_OTHER): Payer: Medicare Other | Admitting: Physician Assistant

## 2019-02-09 VITALS — BP 142/70 | HR 66 | Temp 97.3°F | Ht 62.0 in | Wt 173.2 lb

## 2019-02-09 DIAGNOSIS — I5032 Chronic diastolic (congestive) heart failure: Secondary | ICD-10-CM

## 2019-02-09 DIAGNOSIS — E039 Hypothyroidism, unspecified: Secondary | ICD-10-CM

## 2019-02-09 DIAGNOSIS — Z952 Presence of prosthetic heart valve: Secondary | ICD-10-CM

## 2019-02-09 DIAGNOSIS — I25810 Atherosclerosis of coronary artery bypass graft(s) without angina pectoris: Secondary | ICD-10-CM

## 2019-02-09 DIAGNOSIS — E785 Hyperlipidemia, unspecified: Secondary | ICD-10-CM

## 2019-02-09 DIAGNOSIS — I10 Essential (primary) hypertension: Secondary | ICD-10-CM

## 2019-02-09 DIAGNOSIS — Z8673 Personal history of transient ischemic attack (TIA), and cerebral infarction without residual deficits: Secondary | ICD-10-CM | POA: Diagnosis not present

## 2019-02-09 DIAGNOSIS — I4819 Other persistent atrial fibrillation: Secondary | ICD-10-CM

## 2019-02-09 DIAGNOSIS — E119 Type 2 diabetes mellitus without complications: Secondary | ICD-10-CM

## 2019-02-09 NOTE — Progress Notes (Signed)
Cardiology Office Note    Date:  02/10/2019   ID:  Rebecca Skinner, DOB Jul 05, 1922, MRN [id number]  PCP:  Rebecca Branch, MD  Cardiologist:  Dr. Claiborne Billings   Chief Complaint  Patient presents with  . Other    Patient c/o swelling in feet nad ankles. Meds reviewed verbally with patient.     History of Present Illness:  Rebecca Skinner is a 85 y.o. female with PMH of CAD s/p CABG x1 SVG to RCA and aortic valve replacement 1998, history of multiple CVA, PAF on Eliquis, HTN, HLD, DM 2, GERD, and hypothyroidism. She also has a history of sick sinus syndrome.  She is a former patient of Dr. Rollene Fare.  Myoview obtained on 08/12/2012 was intermediate risk with medium sized fixed lateral defect consistent with previous infarction with mild peri-infarct ischemia.  CardioNet monitor showed atrial fibrillation with bursts of increased heart rate.  He was hospitalized with CVA in March 2015. Carotid Doppler showed mild internal carotid artery disease less than 40% bilaterally.  Her previous aspirin and Plavix were taken off by Dr. [name] and the she was started on Eliquis.  She returned to the hospital in October 2015 with significant anemia and rectal bleeding.  She was treated with 3 units of packed red blood cell.  Colonoscopy revealed AVM in the cecum area which was felt to be the cause of her lower GI bleeding.  She had another episode of TIA in March 2018 with aphasia, symptoms resolved within an hour and it was gone by the time she arrived in the ED.  She was last seen by Dr. Claiborne Billings in February 2020 at which time she was doing well.  A repeat echocardiogram was recommended.  Repeat echocardiogram obtained on 01/27/2019 showed EF 60 to 65%, mild LVH, mild to moderate LAE, moderate RAE, moderate to severe TR, normal aortic valve prosthetic function.  When compared to the previous study in 2019, RV appears to be more dilated.  Patient presents today for cardiology office visit.  There is on recent phone note that suggested she  has gained about 10 pounds.  Her PCP has also treated her with a course of steroid for clear productive cough.  She was instructed to take an additional dose of Lasix 20 mg in the afternoon.  On physical exam, she does not have any significant lower extremity edema.  Recent echocardiogram showed RVSP at 32 mmHg.  Her lungs is clear on physical exam as well.  I did not think she was significantly volume overloaded even though her weight was 6 pounds higher than June.  I am hesitant to permanently increase her diuretic especially given her age.  I recommended continue the current strategy of using 40 mg daily of Lasix with additional 20 mg on as-needed basis for shortness of breath, weight increase greater than 3 pounds or increasing lower extremity edema.  The medication instruction was also discussed with her daughter who accompanied her to today's visit as well.   Past Medical History:  Diagnosis Date  . Anxiety 11/20/2011  . Asthma    UNDER THE CARE [facility] DR PAZ  . CAD (coronary artery disease)    s/p CABG s/p AO valve replacement ([place] [facility])  . CVA (cerebral infarction) 08-2013   multiple, L, d/t Afib, started eloquis  . Diabetes mellitus    type 2   . Dizziness    Chronic, admiet 07-2010,saw neuro, thought to be a peripheral issue   . Dry  skin   . GERD (gastroesophageal reflux disease)   . Hyperlipidemia   . Hypertension   . Hyperthyroidism   . Keloid    @ chest  . Memory loss   . Osteoarthritis   . Osteopenia    per dexa 12/09  . Paroxysmal atrial fibrillation ([place])    cards d/c coumadin 12/2008 d/t persisten NSR and frequent falls- restarted coumadin july 2011, now on Eliquis  . Recurrent UTI   . Shortness of breath dyspnea   . TIA (transient ischemic attack)     Past Surgical History:  Procedure Laterality Date  . ABDOMINAL HYSTERECTOMY    . AORTIC VALVE REPLACEMENT  1998  . CARDIAC VALVE REPLACEMENT    . CAROTID DOPPLER  07/13/12   BILATERAL BULB/PROXIMAL ICAS;MILD  AMOUNT OF FIBROUS PLAQUE WITH NO DIAMETER REDUCTION.  . CESAREAN SECTION     x 2  . COLONOSCOPY WITH PROPOFOL N/A 03/30/2014   Procedure: COLONOSCOPY WITH PROPOFOL;  Surgeon: Irene Shipper, MD;  Location: [facility];  Service: Endoscopy;  Laterality: N/A;  . CORONARY ARTERY BYPASS GRAFT  1998   SVG TO RCA  . HEMORRHOID SURGERY    . JOINT REPLACEMENT    . MYOCARDIAL PERFUSION STUDY  12/19/09   NORMAL PATTERN OF PERFUSION IN ALL REGIONS.EF 75%.  . OOPHORECTOMY    . TOTAL KNEE ARTHROPLASTY  1999  . TRANSTHORACIC ECHOCARDIOGRAM  09/11/11   LVEF >55%.STAGE 1 DIASTOLIC DYSFUNCTION,ELEVATED LV FILLING PRESSURE.BIOPROSTHETIC AORTIC VALVE-PEAK AND MEAN GRADIENTS OF 17 MMHG AND 8 MMHG.SIGMOID SEPTUM.MILD TO MOD MR.MILD TO MOD TR.RVSP 46 MMHG.    Current Medications: Outpatient Medications Prior to Visit  Medication Sig Dispense Refill  . acetaminophen (TYLENOL) 500 MG tablet Take 1,000 mg by mouth every 6 (six) hours as needed (for pain).     Marland Kitchen albuterol (ACCUNEB) 0.63 MG/3ML nebulizer solution Take 3 mLs by nebulization every 6 (six) hours as needed for wheezing or shortness of breath. 75 mL 5  . ALPRAZolam (XANAX) 0.25 MG tablet TAKE 1/2 TABLET OR TAKE 1 TABLET BY MOUTH AT NIGHT AS NEEDED FOR INSOMINIA 30 tablet 0  . amLODipine (NORVASC) 10 MG tablet Take 1 tablet (10 mg total) by mouth daily. 90 tablet 3  . apixaban (ELIQUIS) 5 MG TABS tablet Take 1 tablet (5 mg total) by mouth 2 (two) times daily. 60 tablet 6  . atorvastatin (LIPITOR) 40 MG tablet Take 1 tablet (40 mg total) by mouth at bedtime. 90 tablet 3  . benazepril (LOTENSIN) 40 MG tablet TAKE 1 TABLET BY MOUTH EVERY DAY 90 tablet 1  . budesonide (PULMICORT) 0.5 MG/2ML nebulizer solution Take 2 mLs (0.5 mg total) by nebulization daily. 120 mL 5  . Calcium Carbonate-Vitamin D (CALCIUM 500 + D) 500-125 MG-UNIT TABS Take 1 tablet by mouth at bedtime.    . clotrimazole-betamethasone (LOTRISONE) cream Apply 1 application topically 2 (two) times  daily. 30 g 0  . esomeprazole (NEXIUM) 40 MG capsule Take 1 capsule (40 mg total) by mouth daily as needed. (Patient taking differently: Take 40 mg by mouth daily as needed (for reflux symptoms). ) 90 capsule 3  . furosemide (LASIX) 20 MG tablet Take 2 tablets (40 mg total) by mouth daily. 180 tablet 1  . isosorbide mononitrate (IMDUR) 30 MG 24 hr tablet TAKE 1 TABLET BY MOUTH EVERY DAY 90 tablet 2  . ketoconazole (NIZORAL) 2 % cream Apply 1 application topically daily. 30 g 1  . magnesium oxide (MAG-OX) 400 MG tablet Take 1 tablet (  400 mg total) by mouth daily. 90 tablet 1  . methimazole (TAPAZOLE) 5 MG tablet TAKE 1 TABLET BY MOUTH 3 TIMES A WEEK 30 tablet 5  . metoprolol tartrate (LOPRESSOR) 50 MG tablet Take 1 tablet (50 mg total) by mouth 2 (two) times daily. 180 tablet 1  . nitroGLYCERIN (NITROSTAT) 0.4 MG SL tablet Place 1 tablet (0.4 mg total) under the tongue every 5 (five) minutes x 3 doses as needed for chest pain. 25 tablet 3  . potassium chloride (KLOR-CON M10) 10 MEQ tablet Take 1 tablet (10 mEq total) by mouth daily. 90 tablet 1  . predniSONE (DELTASONE) 10 MG tablet 3 tabs x 2 days, 2 tabs x 2 days, 1 tab x 2 days (Patient not taking: Reported on 02/09/2019) 12 tablet 0   No facility-administered medications prior to visit.      Allergies:   Hydrocodone and Tramadol hcl   Social History   Socioeconomic History  . Marital status: Divorced    Spouse name: Not on file  . Number of children: 4  . Years of education: Not on file  . Highest education level: Not on file  Occupational History  . Occupation: retired     Fish farm manager: RETIRED  Social Needs  . Financial resource strain: Not on file  . Food insecurity    Worry: Not on file    Inability: Not on file  . Transportation needs    Medical: Not on file    Non-medical: Not on file  Tobacco Use  . Smoking status: Former Research scientist (life sciences)  . Smokeless tobacco: Never Used  . Tobacco comment: quit 2004, smoked 1.5 ppd  Substance and  Sexual Activity  . Alcohol use: No    Alcohol/week: 0.0 standard drinks  . Drug use: No  . Sexual activity: Not Currently  Lifestyle  . Physical activity    Days per week: Not on file    Minutes per session: Not on file  . Stress: Not on file  Relationships  . Social Herbalist on phone: Not on file    Gets together: Not on file    Attends religious service: Not on file    Active member of club or organization: Not on file    Attends meetings of clubs or organizations: Not on file    Relationship status: Not on file  Other Topics Concern  . Not on file  Social History Narrative   Had to moved w/ [name]  ~ 01-2016   Had 3 daughter- 2 son  (lost oldest daughter and son), 2 living daughters in [place]     Family History:  The patient's family history includes Coronary artery disease (age of onset: 13) in her son; Heart attack in her father; Hypertension in her brother; Stroke in her brother.   ROS:   Please see the history of present illness.    ROS All other systems reviewed and are negative.   PHYSICAL EXAM:   VS:  BP (!) 142/70 (BP Location: Left Arm, Patient Position: Sitting, Cuff Size: Normal)   Pulse 66   Temp (!) 97.3 F (36.3 C)   Ht 5\' 2"  (1.575 m)   Wt 173 lb 4 oz (78.6 kg)   SpO2 94%   BMI 31.69 kg/m    GEN: Well nourished, well developed, in no acute distress  HEENT: normal  Neck: no JVD, carotid bruits, or masses Cardiac: RRR; no murmurs, rubs, or gallops,no edema  Respiratory:  clear to auscultation bilaterally,  normal work of breathing GI: soft, nontender, nondistended, + BS MS: no deformity or atrophy  Skin: warm and dry, no rash Neuro:  Alert and Oriented x 3, Strength and sensation are intact Psych: euthymic mood, full affect  Wt Readings from Last 3 Encounters:  02/09/19 173 lb 4 oz (78.6 kg)  12/08/18 167 lb 2 oz (75.8 kg)  08/04/18 165 lb 9.6 oz (75.1 kg)      Studies/Labs Reviewed:   EKG:  EKG is not ordered today.     Recent Labs: 12/14/2018: ALT 13; BUN 16; Creatinine, Ser 1.03; Potassium 4.4; Sodium 143; TSH 1.94 12/31/2018: Hemoglobin 12.4; Platelets 164.0   Lipid Panel    Component Value Date/Time   CHOL 97 12/14/2018 0814   TRIG 69.0 12/14/2018 0814   HDL 39.40 12/14/2018 0814   CHOLHDL 2 12/14/2018 0814   VLDL 13.8 12/14/2018 0814   LDLCALC 44 12/14/2018 0814    Additional studies/ records that were reviewed today include:   Echo 01/27/2019 1. The left ventricle has normal systolic function with an ejection fraction of 60-65%. The cavity size was small. There is mild concentric left ventricular hypertrophy. Indeterminate diastolic function due to atrial fibrillation. There is right  ventricular volume overload.  2. The right ventricle has mildly reduced systolic function. The cavity was severely enlarged. There is no increase in right ventricular wall thickness.  3. Left atrial size was mild-moderately dilated.  4. Right atrial size was moderately dilated.  5. The mitral valve is grossly normal.  6. The tricuspid valve is grossly normal. Tricuspid valve regurgitation is moderate-severe.  7. A pericardial bioprosthesis valve is present in the aortic position. Normal aortic valve prosthesis.  8. A bioprosthetic valve is present in the aortic position. The peak/mean gradients are 13.8/8.4 mmHG. AVA by continuity is 1.73 cm2. This is a normally functioning prosthesis.  9. The aorta is normal unless otherwise noted. 10. When compared to the prior study: RV more dilated compared with prior study from 2019. Prosthetic valve function is stable.    ASSESSMENT:    1. Chronic diastolic heart failure ([place])   2. Coronary artery disease involving coronary bypass graft of native heart without angina pectoris   3. S/P AVR (aortic valve replacement)   4. H/O: CVA (cerebrovascular accident)   5. Essential hypertension   6. Hyperlipidemia with target LDL less than 70   7. Controlled type 2 diabetes  mellitus without complication, without long-term current use of insulin ([place])   8. Hypothyroidism, unspecified type   9. Persistent atrial fibrillation      PLAN:  In order of problems listed above:  1. Chronic diastolic heart failure: Although her weight is 6 pounds heavier than her previous weight in June, however she appears to be euvolemic on physical exam.  I am hesitant to permanently increase her diuretic given her age and risk for dehydration.  I advised continue on the current therapy with additional 20 mg Lasix on a as needed basis.  2. CAD s/p CABG: Denies any recent anginal symptoms.  Not on aspirin given the need for Eliquis  3. History of AVR: Stable on recent echocardiogram  4. Persistent atrial fibrillation: On Eliquis.  5. History of CVA: Continue Eliquis therapy  6. Hypertension: Blood pressure mildly elevated today.  I wish to hold off on changing blood pressure medication.  7. Hyperlipidemia: On Lipitor 40 mg daily  8. DM 2: Managed by primary care provider  9. Hypothyroidism: Patient is not on any Synthroid.  I will defer follow-up to primary care provider    Medication Adjustments/Labs and Tests Ordered: Current medicines are reviewed at length with the patient today.  Concerns regarding medicines are outlined above.  Medication changes, Labs and Tests ordered today are listed in the Patient Instructions below. Patient Instructions  Medication Instructions:   You may take an extra dose of Lasix 20 mg AS NEEDED for increased shortness of breath, swelling, or weight gain of 3 lbs overnight or 5 lbs in a week  If you need a refill on your cardiac medications before your next appointment, please call your pharmacy.   Lab work: NONE ordered at this time of appointment   If you have labs (blood work) drawn today and your tests are completely normal, you will receive your results only by: Marland Kitchen MyChart Message (if you have MyChart) OR . A paper copy in the mail  If you have any lab test that is abnormal or we need to change your treatment, we will call you to review the results.  Testing/Procedures: NONE ordered at this time of appointment   Follow-Up: At [facility], you and your health needs are our priority.  As part of our continuing mission to provide you with exceptional heart care, we have created designated Provider Care Teams.  These Care Teams include your primary Cardiologist (physician) and Advanced Practice Providers (APPs -  Physician Assistants and Nurse Practitioners) who all work together to provide you with the care you need, when you need it. You will need a follow up appointment in 6 weeks with Shelva Majestic, MD or Almyra Deforest, PA-C  Any Other Special Instructions Will Be Listed Below (If Applicable).  Heart Failure Prevention Instruction: 1. Avoid salt 2. Limit daily fluid intake to between 32 - 64 oz 3. Weigh yourself daily, call cardiology if weight increase by more than 3 lbs overnight or 5 lbs in a single week    Hilbert Corrigan, [place]  02/10/2019 11:53 PM    [address], [place], [place]  [address] Phone: [phone]; Fax: [phone]

## 2019-02-09 NOTE — Patient Instructions (Addendum)
Medication Instructions:   You may take an extra dose of Lasix 20 mg AS NEEDED for increased shortness of breath, swelling, or weight gain of 3 lbs overnight or 5 lbs in a week  If you need a refill on your cardiac medications before your next appointment, please call your pharmacy.   Lab work: NONE ordered at this time of appointment   If you have labs (blood work) drawn today and your tests are completely normal, you will receive your results only by: Marland Kitchen MyChart Message (if you have MyChart) OR . A paper copy in the mail If you have any lab test that is abnormal or we need to change your treatment, we will call you to review the results.  Testing/Procedures: NONE ordered at this time of appointment   Follow-Up: At [facility], you and your health needs are our priority.  As part of our continuing mission to provide you with exceptional heart care, we have created designated Provider Care Teams.  These Care Teams include your primary Cardiologist (physician) and Advanced Practice Providers (APPs -  Physician Assistants and Nurse Practitioners) who all work together to provide you with the care you need, when you need it. You will need a follow up appointment in 6 weeks with Shelva Majestic, MD or Almyra Deforest, PA-C  Any Other Special Instructions Will Be Listed Below (If Applicable).  Heart Failure Prevention Instruction: 1. Avoid salt 2. Limit daily fluid intake to between 32 - 64 oz 3. Weigh yourself daily, call cardiology if weight increase by more than 3 lbs overnight or 5 lbs in a single week

## 2019-02-10 ENCOUNTER — Encounter: Payer: Self-pay | Admitting: Physician Assistant

## 2019-02-19 ENCOUNTER — Other Ambulatory Visit: Payer: Self-pay

## 2019-02-19 NOTE — Patient Outreach (Signed)
[facility]) Care Management  02/19/2019  Rebecca Skinner 01/14/1923 [id number]    1st unsuccessful outreach to the patient for assessment.  No answer.  HIPAA compliant voicemail left with contact information.  Plan: RN Health Coach will send letter. RN Health Coach will make outreach attempt to the patient within thirty business days.  Lazaro Arms RN, BSN, [place] Direct Dial:  [phone]  Fax: [phone]

## 2019-02-22 ENCOUNTER — Other Ambulatory Visit: Payer: Self-pay

## 2019-02-22 NOTE — Patient Outreach (Addendum)
[facility]) Care Management  02/22/2019  AOI GODETTE Jun 17, 1922 [id number]    2nd unsuccessful outreach attempt to the patient.  No answer.  HIPAA compliant voicemail left with contact information.  Plan: RN Health Coach will make outreach attempt to the patient within thirty business  days.   Lazaro Arms RN, BSN, [place] Direct Dial:  [phone]  Fax: [phone]

## 2019-02-24 DIAGNOSIS — H179 Unspecified corneal scar and opacity: Secondary | ICD-10-CM | POA: Diagnosis not present

## 2019-02-24 DIAGNOSIS — H18422 Band keratopathy, left eye: Secondary | ICD-10-CM | POA: Diagnosis not present

## 2019-02-24 DIAGNOSIS — H2512 Age-related nuclear cataract, left eye: Secondary | ICD-10-CM | POA: Diagnosis not present

## 2019-02-24 DIAGNOSIS — Z961 Presence of intraocular lens: Secondary | ICD-10-CM | POA: Diagnosis not present

## 2019-02-24 LAB — HM DIABETES EYE EXAM

## 2019-03-12 ENCOUNTER — Encounter: Payer: Self-pay | Admitting: Internal Medicine

## 2019-03-12 ENCOUNTER — Other Ambulatory Visit: Payer: Self-pay

## 2019-03-12 NOTE — Patient Outreach (Addendum)
[facility]) Care Management  03/12/2019  Rebecca Skinner 1922/07/12 [id number]    3rd unsuccessful outreach attempt to the patient for assessment.  No answer.  HIPAA compliant voicemail left with contact information.  Plan: RN Health Coach has made several attempts to outreach the patient.  RN Health Coach will outreach the patient bimonthly until reached.  Lazaro Arms RN, BSN, [place] Direct Dial:  [phone]  Fax: [phone]

## 2019-03-13 ENCOUNTER — Other Ambulatory Visit: Payer: Self-pay | Admitting: Internal Medicine

## 2019-03-23 ENCOUNTER — Ambulatory Visit: Payer: Medicare Other | Admitting: Physician Assistant

## 2019-03-26 ENCOUNTER — Other Ambulatory Visit: Payer: Self-pay | Admitting: Internal Medicine

## 2019-03-26 DIAGNOSIS — J45909 Unspecified asthma, uncomplicated: Secondary | ICD-10-CM

## 2019-03-31 DIAGNOSIS — M48062 Spinal stenosis, lumbar region with neurogenic claudication: Secondary | ICD-10-CM | POA: Diagnosis not present

## 2019-03-31 DIAGNOSIS — M545 Low back pain: Secondary | ICD-10-CM | POA: Diagnosis not present

## 2019-04-07 ENCOUNTER — Other Ambulatory Visit: Payer: Self-pay

## 2019-04-08 ENCOUNTER — Encounter: Payer: Self-pay | Admitting: Internal Medicine

## 2019-04-08 ENCOUNTER — Ambulatory Visit: Payer: Medicare Other | Admitting: Internal Medicine

## 2019-04-08 ENCOUNTER — Ambulatory Visit (INDEPENDENT_AMBULATORY_CARE_PROVIDER_SITE_OTHER): Payer: Medicare Other | Admitting: Internal Medicine

## 2019-04-08 VITALS — BP 162/88 | HR 89 | Temp 97.1°F | Resp 18 | Ht 62.0 in | Wt 168.0 lb

## 2019-04-08 DIAGNOSIS — I10 Essential (primary) hypertension: Secondary | ICD-10-CM | POA: Diagnosis not present

## 2019-04-08 DIAGNOSIS — E1159 Type 2 diabetes mellitus with other circulatory complications: Secondary | ICD-10-CM

## 2019-04-08 NOTE — Progress Notes (Signed)
Subjective:    Patient ID: Rebecca Skinner, female    DOB: 1923/01/18, 85 y.o.   MRN: [id number]  DOS:  04/08/2019 Type of visit - description: Follow-up, here with her daughter [name] HTN: Good med compliance, ambulatory BP range from normal to elevated.  No clear-cut pattern. Asthma: Still has occasional congestion and whitish sputum production.  No excessive cough or wheezing.  Is the daughter's observation that the sputum production decreases when she takes prednisone for what ever reason. CAD: Note from cardiology reviewed  Wt Readings from Last 3 Encounters:  04/08/19 168 lb (76.2 kg)  02/09/19 173 lb 4 oz (78.6 kg)  12/08/18 167 lb 2 oz (75.8 kg)    Review of Systems Denies chest pain, palpitations. No lower extremity edema. She needs help with ADLs including showering, cooking, etc. No short of breath when she moves around her house but when she walks more than 100 yards, for instance going to the pharmacy or to her doctor's appointments,  she gets SOB. Appetite is very good. No nausea, vomiting, diarrhea \  Past Medical History:  Diagnosis Date  . Anxiety 11/20/2011  . Asthma    UNDER [facility] DR [place] Dohmen  . CAD (coronary artery disease)    s/p CABG s/p AO valve replacement ([place] [facility])  . CVA (cerebral infarction) 08-2013   multiple, L, d/t Afib, started eloquis  . Diabetes mellitus    type 2   . Dizziness    Chronic, admiet 07-2010,saw neuro, thought to be a peripheral issue   . Dry skin   . GERD (gastroesophageal reflux disease)   . Hyperlipidemia   . Hypertension   . Hyperthyroidism   . Keloid    @ chest  . Memory loss   . Osteoarthritis   . Osteopenia    per dexa 12/09  . Paroxysmal atrial fibrillation ([place])    cards d/c coumadin 12/2008 d/t persisten NSR and frequent falls- restarted coumadin july 2011, now on Eliquis  . Recurrent UTI   . Shortness of breath dyspnea   . TIA (transient ischemic attack)     Past Surgical History:  Procedure Laterality  Date  . ABDOMINAL HYSTERECTOMY    . AORTIC VALVE REPLACEMENT  1998  . CARDIAC VALVE REPLACEMENT    . CAROTID DOPPLER  07/13/12   BILATERAL BULB/PROXIMAL ICAS;MILD AMOUNT OF FIBROUS PLAQUE WITH NO DIAMETER REDUCTION.  . CESAREAN SECTION     x 2  . COLONOSCOPY WITH PROPOFOL N/A 03/30/2014   Procedure: COLONOSCOPY WITH PROPOFOL;  Surgeon: Irene Shipper, MD;  Location: [facility];  Service: Endoscopy;  Laterality: N/A;  . CORONARY ARTERY BYPASS GRAFT  1998   SVG TO RCA  . HEMORRHOID SURGERY    . JOINT REPLACEMENT    . MYOCARDIAL PERFUSION STUDY  12/19/09   NORMAL PATTERN OF PERFUSION IN ALL REGIONS.EF 75%.  . OOPHORECTOMY    . TOTAL KNEE ARTHROPLASTY  1999  . TRANSTHORACIC ECHOCARDIOGRAM  09/11/11   LVEF >55%.STAGE 1 DIASTOLIC DYSFUNCTION,ELEVATED LV FILLING PRESSURE.BIOPROSTHETIC AORTIC VALVE-PEAK AND MEAN GRADIENTS OF 17 MMHG AND 8 MMHG.SIGMOID SEPTUM.MILD TO MOD MR.MILD TO MOD TR.RVSP 46 MMHG.    Social History   Socioeconomic History  . Marital status: Divorced    Spouse name: Not on file  . Number of children: 4  . Years of education: Not on file  . Highest education level: Not on file  Occupational History  . Occupation: retired     Fish farm manager: RETIRED  Social Needs  .  Financial resource strain: Not on file  . Food insecurity    Worry: Not on file    Inability: Not on file  . Transportation needs    Medical: Not on file    Non-medical: Not on file  Tobacco Use  . Smoking status: Former Research scientist (life sciences)  . Smokeless tobacco: Never Used  . Tobacco comment: quit 2004, smoked 1.5 ppd  Substance and Sexual Activity  . Alcohol use: No    Alcohol/week: 0.0 standard drinks  . Drug use: No  . Sexual activity: Not Currently  Lifestyle  . Physical activity    Days per week: Not on file    Minutes per session: Not on file  . Stress: Not on file  Relationships  . Social Herbalist on phone: Not on file    Gets together: Not on file    Attends religious service: Not on file     Active member of club or organization: Not on file    Attends meetings of clubs or organizations: Not on file    Relationship status: Not on file  . Intimate partner violence    Fear of current or ex partner: Not on file    Emotionally abused: Not on file    Physically abused: Not on file    Forced sexual activity: Not on file  Other Topics Concern  . Not on file  Social History Narrative   Had to moved w/ [name]  ~ 01-2016   Had 3 daughter- 2 son  (lost oldest daughter and son), 2 living daughters in [place] as of 04/08/2019      Reactions   Hydrocodone Itching, Nausea Only   Tramadol Hcl Itching, Nausea Only      Medication List       Accurate as of April 08, 2019  2:31 PM. If you have any questions, ask your nurse or doctor.        acetaminophen 500 MG tablet Commonly known as: TYLENOL Take 1,000 mg by mouth every 6 (six) hours as needed (for pain).   albuterol 0.63 MG/3ML nebulizer solution Commonly known as: ACCUNEB Take 3 mLs by nebulization every 6 (six) hours as needed for wheezing or shortness of breath.   ALPRAZolam 0.25 MG tablet Commonly known as: XANAX TAKE 1/2 TABLET OR TAKE 1 TABLET BY MOUTH AT NIGHT AS NEEDED FOR INSOMINIA   amLODipine 10 MG tablet Commonly known as: NORVASC Take 1 tablet (10 mg total) by mouth daily.   apixaban 5 MG Tabs tablet Commonly known as: Eliquis Take 1 tablet (5 mg total) by mouth 2 (two) times daily.   atorvastatin 40 MG tablet Commonly known as: LIPITOR Take 1 tablet (40 mg total) by mouth at bedtime.   benazepril 40 MG tablet Commonly known as: LOTENSIN TAKE 1 TABLET BY MOUTH EVERY DAY   budesonide 0.5 MG/2ML nebulizer solution Commonly known as: PULMICORT Take 2 mLs (0.5 mg total) by nebulization daily.   Calcium 500 + D 500-125 MG-UNIT Tabs Generic drug: Calcium Carbonate-Vitamin D Take 1 tablet by mouth at bedtime.   clotrimazole-betamethasone cream Commonly known as: LOTRISONE  Apply 1 application topically 2 (two) times daily.   esomeprazole 40 MG capsule Commonly known as: NEXIUM Take 1 capsule (40 mg total) by mouth daily as needed. What changed: reasons to take this   furosemide 20 MG tablet Commonly known as: LASIX Take 2 tablets (40 mg total) by mouth daily.   isosorbide mononitrate  30 MG 24 hr tablet Commonly known as: IMDUR TAKE 1 TABLET BY MOUTH EVERY DAY   ketoconazole 2 % cream Commonly known as: NIZORAL Apply 1 application topically daily.   magnesium oxide 400 MG tablet Commonly known as: MAG-OX Take 1 tablet (400 mg total) by mouth daily.   methimazole 5 MG tablet Commonly known as: TAPAZOLE TAKE 1 TABLET BY MOUTH 3 TIMES A WEEK   metoprolol tartrate 50 MG tablet Commonly known as: LOPRESSOR Take 1 tablet (50 mg total) by mouth 2 (two) times daily.   nitroGLYCERIN 0.4 MG SL tablet Commonly known as: NITROSTAT Place 1 tablet (0.4 mg total) under the tongue every 5 (five) minutes x 3 doses as needed for chest pain.   potassium chloride 10 MEQ tablet Commonly known as: Klor-Con M10 Take 1 tablet (10 mEq total) by mouth daily.           Objective:   Physical Exam BP (!) 162/88 (BP Location: Right Arm, Patient Position: Sitting, Cuff Size: Small)   Pulse 89   Temp (!) 97.1 F (36.2 C) (Temporal)   Resp 18   Ht 5\' 2"  (1.575 m)   Wt 168 lb (76.2 kg)   SpO2 100%   BMI 30.73 kg/m  General:   Well developed, NAD, BMI noted.  HEENT:  Normocephalic . Face symmetric, atraumatic Lungs: Slightly decreased inspiratory effort but clear. Heart: irreg.  no pretibial edema bilaterally  Abdomen:  Not distended, soft, non-tender. No rebound or rigidity.   Skin: Not pale. Not jaundice Neurologic:  alert & pleasant , cooperative   Speech normal, gait not tested Psych Behavior appropriate. No anxious or depressed appearing.     Assessment    Assessment DM HTN Hyperlipidemia Hyperthyroidism -- used to see Dr. Loanne Drilling,  last visit 01-2016, now f/u PCP, check labs x 2 q year GERD Asthma  Anxiety on xanax  CV: Dr. Claiborne Billings --CHF, CAD, CABG, Aortic valve replacement --P- atrial fibrillation, used to be on Coumadin, Eliquis rx after the stroke 3-15, d/c 03-2014 d/t GI bleed , on ASA 81. Dx TIA 08-2016, back on eliquis, asa d/c.   NEURO --TIAs (admited 08-2016), CVA 2015 --Dementia MMSE ~ 12 to 15  (12-2016) DJD Osteopenia 2009 Recurrent UTIs Daughter  Lain Bustamante; lives w/ Nash Dimmer DM: Diet controlled, check A1c. HTN: At the last visit, amlodipine was increased, good compliance and tolerance, no edema.  Also takes Lotensin, Lasix 2 tablets daily (occasionally extra tablet for edema) Imdur, metoprolol, potassium.  BPs at home range from normal to elevated. I am hesitant to increase her BP meds since her BPs are normal sometimes, we agreed to continue checking. If BPs are consistently more than 155/90 they will let me know.  Check a BMP. CHF, CAD, aortic VR, paroxysmal A. fib: Saw cardiology 02/09/2019, note reviewed.  Was felt to be euvolemic  Thyroid dz: Last TFTs normal, on Tapazole. Asthma: Occasional white sputum production but otherwise doing okay.  No change RTC 4-5 m physical exam.

## 2019-04-08 NOTE — Patient Instructions (Addendum)
GO TO THE LAB : Get the blood work     GO TO THE FRONT DESK Schedule your next appointment  For a physical exam in 4 to 5 months      Continue checking blood pressures  BP GOAL is between 110/65 and  135/85. If consistently more than 155/90, please call  If it is consistently higher or lower, let me know

## 2019-04-08 NOTE — Progress Notes (Signed)
Pre visit review using our clinic review tool, if applicable. No additional management support is needed unless otherwise documented below in the visit note. 

## 2019-04-09 LAB — BASIC METABOLIC PANEL
BUN: 14 mg/dL (ref 6–23)
CO2: 32 mEq/L (ref 19–32)
Calcium: 9.1 mg/dL (ref 8.4–10.5)
Chloride: 99 mEq/L (ref 96–112)
Creatinine, Ser: 0.95 mg/dL (ref 0.40–1.20)
GFR: 66.25 mL/min (ref >60.00)
Glucose, Bld: 109 mg/dL — ABNORMAL HIGH (ref 70–99)
Potassium: 4 mEq/L (ref 3.5–5.1)
Sodium: 140 mEq/L (ref 135–145)

## 2019-04-09 LAB — HEMOGLOBIN A1C: Hgb A1c MFr Bld: 7.2 % — ABNORMAL HIGH (ref 4.6–6.5)

## 2019-04-10 NOTE — Assessment & Plan Note (Signed)
DM: Diet controlled, check A1c. HTN: At the last visit, amlodipine was increased, good compliance and tolerance, no edema.  Also takes Lotensin, Lasix 2 tablets daily (occasionally extra tablet for edema) Imdur, metoprolol, potassium.  BPs at home range from normal to elevated. I am hesitant to increase her BP meds since her BPs are normal sometimes, we agreed to continue checking. If BPs are consistently more than 155/90 they will let me know.  Check a BMP. CHF, CAD, aortic VR, paroxysmal A. fib: Saw cardiology 02/09/2019, note reviewed.  Was felt to be euvolemic  Thyroid dz: Last TFTs normal, on Tapazole. Asthma: Occasional white sputum production but otherwise doing okay.  No change RTC 4-5 m physical exam.

## 2019-04-22 ENCOUNTER — Ambulatory Visit: Payer: Medicare Other | Admitting: Physician Assistant

## 2019-04-22 ENCOUNTER — Other Ambulatory Visit: Payer: Self-pay

## 2019-04-22 ENCOUNTER — Encounter: Payer: Self-pay | Admitting: Physician Assistant

## 2019-04-22 VITALS — BP 138/72 | HR 65 | Ht 63.0 in | Wt 174.0 lb

## 2019-04-22 DIAGNOSIS — E0590 Thyrotoxicosis, unspecified without thyrotoxic crisis or storm: Secondary | ICD-10-CM

## 2019-04-22 DIAGNOSIS — I4891 Unspecified atrial fibrillation: Secondary | ICD-10-CM

## 2019-04-22 DIAGNOSIS — Z8673 Personal history of transient ischemic attack (TIA), and cerebral infarction without residual deficits: Secondary | ICD-10-CM

## 2019-04-22 DIAGNOSIS — E119 Type 2 diabetes mellitus without complications: Secondary | ICD-10-CM

## 2019-04-22 DIAGNOSIS — Z952 Presence of prosthetic heart valve: Secondary | ICD-10-CM | POA: Diagnosis not present

## 2019-04-22 DIAGNOSIS — R600 Localized edema: Secondary | ICD-10-CM | POA: Diagnosis not present

## 2019-04-22 DIAGNOSIS — E785 Hyperlipidemia, unspecified: Secondary | ICD-10-CM

## 2019-04-22 DIAGNOSIS — I25810 Atherosclerosis of coronary artery bypass graft(s) without angina pectoris: Secondary | ICD-10-CM

## 2019-04-22 DIAGNOSIS — I10 Essential (primary) hypertension: Secondary | ICD-10-CM

## 2019-04-22 NOTE — Progress Notes (Signed)
Cardiology Office Note    Date:  04/22/2019   ID:  Rebecca Skinner, DOB [birth year], MRN [id number]  PCP:  Colon Branch, MD  Cardiologist:  Dr. Claiborne Billings  Chief Complaint  Patient presents with  . Follow-up    seen for Dr. Claiborne Billings. Leg edema    History of Present Illness:  Rebecca Skinner is a 85 y.o. female with PMH of CAD s/p CABG x1 SVG to RCA and aortic valve replacement 1998, history of multiple CVA, persistent afib on Eliquis, HTN, HLD, DM 2, GERD, and hyperthyroidism. She also has a history of sick sinus syndrome.  She is a former patient of Dr. Rollene Fare.  Myoview obtained on 08/12/2012 was intermediate risk with medium sized fixed lateral defect consistent with previous infarction with mild peri-infarct ischemia.  CardioNet monitor showed atrial fibrillation with bursts of increased heart rate.  She was hospitalized with CVA in March 2015. Carotid Doppler showed mild internal carotid artery disease less than 40% bilaterally.  Her previous aspirin and Plavix were taken off by Dr. [name] and the she was started on Eliquis.  She returned to the hospital in October 2015 with significant anemia and rectal bleeding.  She was treated with 3 units of packed red blood cell.  Colonoscopy revealed AVM in the cecum area which was felt to be the cause of her lower GI bleeding.  She had another episode of TIA in March 2018 with aphasia, symptoms resolved within an hour and it was gone by the time she arrived in the ED. Repeat echocardiogram obtained on 01/27/2019 showed EF 60 to 65%, mild LVH, mild to moderate LAE, moderate RAE, moderate to severe TR, normal aortic valve prosthetic function.  When compared to the previous study in 2019, RV appears to be more dilated.  I last saw the patient on 02/09/2019, there was some concern that she has gained 10 pounds after her PCP treated her with a course of steroid to clear productive cough.  She was instructed to take additional dose of 20 mg of Lasix in the afternoon.  On  follow-up, she did not appears to be significantly volume overloaded even though her weight was up by 6 pounds.  After discussing with her daughter, we decided to continue the current strategy of using 40 mg daily of Lasix with additional 20 mg on as-needed basis for shortness of breath, weight increase greater than 3 pounds or increasing lower extremity edema.  Patient returns today for follow-up.  Patient presents today for cardiology office visit.  Even though her blood pressure was high during recent visit with her PCP, however her blood pressure is normal today.  Her PCP also recognized that her blood pressure can be labile and wished to continue on the current medication to avoid significant drop in the blood pressure at home.  Her daughter is checking her blood pressure on a daily basis.  On physical exam, she does not have significant lower extremity edema.  In the past 2 months, she only used extra dose of diuretic twice.  Her lungs is clear on physical exam.  Her heart murmur is stable.  I recommended follow-up in 6 months.  She otherwise has been doing well for a 85 year old.  She sleeps a lot according to her daughter, this is unchanged.  She also gained some weight around her abdomen, I did not detect any sign of the ascites based on physical exam.  I think this is likely true weight gain.  Past Medical  History:  Diagnosis Date  . Anxiety 11/20/2011  . Asthma    UNDER THE CARE [facility] DR PAZ  . CAD (coronary artery disease)    s/p CABG s/p AO valve replacement ([place] [facility])  . CVA (cerebral infarction) 08-2013   multiple, L, d/t Afib, started eloquis  . Diabetes mellitus    type 2   . Dizziness    Chronic, admiet 07-2010,saw neuro, thought to be a peripheral issue   . Dry skin   . GERD (gastroesophageal reflux disease)   . Hyperlipidemia   . Hypertension   . Hyperthyroidism   . Keloid    @ chest  . Memory loss   . Osteoarthritis   . Osteopenia    per dexa 12/09  . Paroxysmal  atrial fibrillation ([place])    cards d/c coumadin 12/2008 d/t persisten NSR and frequent falls- restarted coumadin july 2011, now on Eliquis  . Recurrent UTI   . Shortness of breath dyspnea   . TIA (transient ischemic attack)     Past Surgical History:  Procedure Laterality Date  . ABDOMINAL HYSTERECTOMY    . AORTIC VALVE REPLACEMENT  1998  . CARDIAC VALVE REPLACEMENT    . CAROTID DOPPLER  07/13/12   BILATERAL BULB/PROXIMAL ICAS;MILD AMOUNT OF FIBROUS PLAQUE WITH NO DIAMETER REDUCTION.  . CESAREAN SECTION     x 2  . COLONOSCOPY WITH PROPOFOL N/A 03/30/2014   Procedure: COLONOSCOPY WITH PROPOFOL;  Surgeon: Irene Shipper, MD;  Location: [facility];  Service: Endoscopy;  Laterality: N/A;  . CORONARY ARTERY BYPASS GRAFT  1998   SVG TO RCA  . HEMORRHOID SURGERY    . JOINT REPLACEMENT    . MYOCARDIAL PERFUSION STUDY  12/19/09   NORMAL PATTERN OF PERFUSION IN ALL REGIONS.EF 75%.  . OOPHORECTOMY    . TOTAL KNEE ARTHROPLASTY  1999  . TRANSTHORACIC ECHOCARDIOGRAM  09/11/11   LVEF >55%.STAGE 1 DIASTOLIC DYSFUNCTION,ELEVATED LV FILLING PRESSURE.BIOPROSTHETIC AORTIC VALVE-PEAK AND MEAN GRADIENTS OF 17 MMHG AND 8 MMHG.SIGMOID SEPTUM.MILD TO MOD MR.MILD TO MOD TR.RVSP 46 MMHG.    Current Medications: Outpatient Medications Prior to Visit  Medication Sig Dispense Refill  . acetaminophen (TYLENOL) 500 MG tablet Take 1,000 mg by mouth every 6 (six) hours as needed (for pain).     Marland Kitchen albuterol (ACCUNEB) 0.63 MG/3ML nebulizer solution Take 3 mLs by nebulization every 6 (six) hours as needed for wheezing or shortness of breath. 75 mL 5  . ALPRAZolam (XANAX) 0.25 MG tablet TAKE 1/2 TABLET OR TAKE 1 TABLET BY MOUTH AT NIGHT AS NEEDED FOR INSOMINIA 30 tablet 0  . amLODipine (NORVASC) 10 MG tablet Take 1 tablet (10 mg total) by mouth daily. 90 tablet 3  . apixaban (ELIQUIS) 5 MG TABS tablet Take 1 tablet (5 mg total) by mouth 2 (two) times daily. 60 tablet 6  . atorvastatin (LIPITOR) 40 MG tablet Take 1 tablet  (40 mg total) by mouth at bedtime. 90 tablet 3  . benazepril (LOTENSIN) 40 MG tablet TAKE 1 TABLET BY MOUTH EVERY DAY 90 tablet 1  . budesonide (PULMICORT) 0.5 MG/2ML nebulizer solution Take 2 mLs (0.5 mg total) by nebulization daily. 360 mL 1  . Calcium Carbonate-Vitamin D (CALCIUM 500 + D) 500-125 MG-UNIT TABS Take 1 tablet by mouth at bedtime.    Marland Kitchen esomeprazole (NEXIUM) 40 MG capsule Take 1 capsule (40 mg total) by mouth daily as needed. (Patient taking differently: Take 40 mg by mouth daily as needed (for reflux symptoms). ) 90 capsule  3  . furosemide (LASIX) 20 MG tablet Take 2 tablets (40 mg total) by mouth daily. 180 tablet 1  . isosorbide mononitrate (IMDUR) 30 MG 24 hr tablet TAKE 1 TABLET BY MOUTH EVERY DAY 90 tablet 2  . magnesium oxide (MAG-OX) 400 MG tablet Take 1 tablet (400 mg total) by mouth daily. 90 tablet 3  . methimazole (TAPAZOLE) 5 MG tablet TAKE 1 TABLET BY MOUTH 3 TIMES A WEEK 30 tablet 5  . metoprolol tartrate (LOPRESSOR) 50 MG tablet Take 1 tablet (50 mg total) by mouth 2 (two) times daily. 180 tablet 1  . nitroGLYCERIN (NITROSTAT) 0.4 MG SL tablet Place 1 tablet (0.4 mg total) under the tongue every 5 (five) minutes x 3 doses as needed for chest pain. 25 tablet 3  . potassium chloride (KLOR-CON M10) 10 MEQ tablet Take 1 tablet (10 mEq total) by mouth daily. 90 tablet 1  . clotrimazole-betamethasone (LOTRISONE) cream Apply 1 application topically 2 (two) times daily. 30 g 0  . ketoconazole (NIZORAL) 2 % cream Apply 1 application topically daily. 30 g 1   No facility-administered medications prior to visit.      Allergies:   Hydrocodone and Tramadol hcl   Social History   Socioeconomic History  . Marital status: Divorced    Spouse name: Not on file  . Number of children: 4  . Years of education: Not on file  . Highest education level: Not on file  Occupational History  . Occupation: retired     Fish farm manager: RETIRED  Social Needs  . Financial resource strain: Not  on file  . Food insecurity    Worry: Not on file    Inability: Not on file  . Transportation needs    Medical: Not on file    Non-medical: Not on file  Tobacco Use  . Smoking status: Former Research scientist (life sciences)  . Smokeless tobacco: Never Used  . Tobacco comment: quit 2004, smoked 1.5 ppd  Substance and Sexual Activity  . Alcohol use: No    Alcohol/week: 0.0 standard drinks  . Drug use: No  . Sexual activity: Not Currently  Lifestyle  . Physical activity    Days per week: Not on file    Minutes per session: Not on file  . Stress: Not on file  Relationships  . Social Herbalist on phone: Not on file    Gets together: Not on file    Attends religious service: Not on file    Active member of club or organization: Not on file    Attends meetings of clubs or organizations: Not on file    Relationship status: Not on file  Other Topics Concern  . Not on file  Social History Narrative   Had to moved w/ [name]  ~ 01-2016   Had 3 daughter- 2 son  (lost oldest daughter and son), 2 living daughters in [place]     Family History:  The patient's family history includes Coronary artery disease (age of onset: 66) in her son; Heart attack in her father; Hypertension in her brother; Stroke in her brother.   ROS:   Please see the history of present illness.    ROS All other systems reviewed and are negative.   PHYSICAL EXAM:   VS:  BP 138/72   Pulse 65   Ht 5\' 3"  (1.6 m)   Wt 174 lb (78.9 kg)   SpO2 98%   BMI 30.82 kg/m    GEN: Well nourished, well developed,  in no acute distress  HEENT: normal  Neck: no JVD, carotid bruits, or masses Cardiac: Irregularly irregular; no rubs, or gallops,no edema  2/6 murmur Respiratory:  clear to auscultation bilaterally, normal work of breathing GI: soft, nontender, nondistended, + BS MS: no deformity or atrophy  Skin: warm and dry, no rash Neuro:  Alert and Oriented x 3, Strength and sensation are intact Psych: euthymic mood, full affect  Wt  Readings from Last 3 Encounters:  04/22/19 174 lb (78.9 kg)  04/08/19 168 lb (76.2 kg)  02/09/19 173 lb 4 oz (78.6 kg)      Studies/Labs Reviewed:   EKG:  EKG is ordered today.  The ekg ordered today demonstrates persistent atrial fibrillation  Recent Labs: 12/14/2018: ALT 13; TSH 1.94 12/31/2018: Hemoglobin 12.4; Platelets 164.0 04/08/2019: BUN 14; Creatinine, Ser 0.95; Potassium 4.0; Sodium 140   Lipid Panel    Component Value Date/Time   CHOL 97 12/14/2018 0814   TRIG 69.0 12/14/2018 0814   HDL 39.40 12/14/2018 0814   CHOLHDL 2 12/14/2018 0814   VLDL 13.8 12/14/2018 0814   LDLCALC 44 12/14/2018 0814    Additional studies/ records that were reviewed today include:   Echo 01/27/2019 IMPRESSIONS    1. The left ventricle has normal systolic function with an ejection fraction of 60-65%. The cavity size was small. There is mild concentric left ventricular hypertrophy. Indeterminate diastolic function due to atrial fibrillation. There is right  ventricular volume overload.  2. The right ventricle has mildly reduced systolic function. The cavity was severely enlarged. There is no increase in right ventricular wall thickness.  3. Left atrial size was mild-moderately dilated.  4. Right atrial size was moderately dilated.  5. The mitral valve is grossly normal.  6. The tricuspid valve is grossly normal. Tricuspid valve regurgitation is moderate-severe.  7. A pericardial bioprosthesis valve is present in the aortic position. Normal aortic valve prosthesis.  8. A bioprosthetic valve is present in the aortic position. The peak/mean gradients are 13.8/8.4 mmHG. AVA by continuity is 1.73 cm2. This is a normally functioning prosthesis.  9. The aorta is normal unless otherwise noted. 10. When compared to the prior study: RV more dilated compared with prior study from 2019. Prosthetic valve function is stable.    ASSESSMENT:    1. Lower extremity edema   2. Atrial fibrillation,  unspecified type ([place])   3. Coronary artery disease involving coronary bypass graft of native heart without angina pectoris   4. S/P AVR   5. H/O: CVA (cerebrovascular accident)   6. Essential hypertension   7. Hyperlipidemia with target LDL less than 70   8. Controlled type 2 diabetes mellitus without complication, without long-term current use of insulin ([place])   9. Hyperthyroidism      PLAN:  In order of problems listed above:  1. Lower extremity edema: Well-controlled on the current diuretic.  Continue 40 mg daily of Lasix and use additional 20 mg Lasix on a as needed basis for weight increase by 3 pounds, increased lower extremity edema or increasing shortness of breath.  2. CAD s/p CABG: Denies any recent anginal symptoms.  Not on aspirin given the need for Eliquis  3. Aortic valve replacement 1998: Stable based on physical exam.  Denies worsening heart failure symptoms.  Stable on recent echocardiogram  4. History of multiple CVA: On Eliquis  5. Persistent A. fib on Eliquis: Continue to be in atrial fibrillation based on today's EKG.  Right controlled on metoprolol  6.  Hypertension: Blood pressure stable on current medication  7. Hyperlipidemia: Continue Lipitor  8. DM2: Managed by primary care provider  9. Hyperthyroidism: On methimazole   Medication Adjustments/Labs and Tests Ordered: Current medicines are reviewed at length with the patient today.  Concerns regarding medicines are outlined above.  Medication changes, Labs and Tests ordered today are listed in the Patient Instructions below. Patient Instructions  Medication Instructions:   Your physician recommends that you continue on your current medications as directed. Please refer to the Current Medication list given to you today.  *If you need a refill on your cardiac medications before your next appointment, please call your pharmacy*  Lab Work:  NONE ordered at this time of appointment   If you have labs  (blood work) drawn today and your tests are completely normal, you will receive your results only by: Marland Kitchen MyChart Message (if you have MyChart) OR . A paper copy in the mail If you have any lab test that is abnormal or we need to change your treatment, we will call you to review the results.  Testing/Procedures:  NONE ordered at this time of appointment   Follow-Up: At [facility], you and your health needs are our priority.  As part of our continuing mission to provide you with exceptional heart care, we have created designated Provider Care Teams.  These Care Teams include your primary Cardiologist (physician) and Advanced Practice Providers (APPs -  Physician Assistants and Nurse Practitioners) who all work together to provide you with the care you need, when you need it.  Your next appointment:   6 months  The format for your next appointment:   In Person  Provider:   Shelva Majestic, MD  Other Instructions      Signed, Almyra Deforest, [place]  04/22/2019 10:55 AM    [address], [place], [place]  [address] Phone: [phone]; Fax: [phone]

## 2019-04-22 NOTE — Patient Instructions (Signed)
Medication Instructions:  °Your physician recommends that you continue on your current medications as directed. Please refer to the Current Medication list given to you today. ° °*If you need a refill on your cardiac medications before your next appointment, please call your pharmacy* ° °Lab Work: °NONE ordered at this time of appointment  ° °If you have labs (blood work) drawn today and your tests are completely normal, you will receive your results only by: °MyChart Message (if you have MyChart) OR °A paper copy in the mail °If you have any lab test that is abnormal or we need to change your treatment, we will call you to review the results. ° °Testing/Procedures: °NONE ordered at this time of appointment  ° °Follow-Up: °At [facility], you and your health needs are our priority.  As part of our continuing mission to provide you with exceptional heart care, we have created designated Provider Care Teams.  These Care Teams include your primary Cardiologist (physician) and Advanced Practice Providers (APPs -  Physician Assistants and Nurse Practitioners) who all work together to provide you with the care you need, when you need it. ° °Your next appointment:   °6 month(s) ° °The format for your next appointment:   °In Person ° °Provider:   °Thomas Kelly, MD   ° °Other Instructions ° ° °

## 2019-05-10 ENCOUNTER — Other Ambulatory Visit: Payer: Self-pay | Admitting: *Deleted

## 2019-05-10 NOTE — Patient Outreach (Signed)
[facility]) Care Management  05/10/2019  Rebecca Skinner Dec 13, 1922 [id number]   RN Health Coach Monthly Outreach  Referral Date:  12/03/2018 Referral Source:  [facility] High Risk Screening Reason for Referral:  Disease Management Education Insurance:  [employer]   Outreach Attempt:  Outreach attempt #4 to patient for follow up. No answer. RN Health Coach left HIPAA compliant voicemail message along with contact information.  Plan:  RN Health Coach will send Unsuccessful Letter.  RN Health Coach will make another outreach attempt within the month of December if no return call back from patient.   [name] [phone] [name]

## 2019-05-12 ENCOUNTER — Ambulatory Visit: Payer: Self-pay

## 2019-05-21 ENCOUNTER — Telehealth: Payer: Self-pay

## 2019-05-21 MED ORDER — PREDNISONE 20 MG PO TABS: 20.0000 mg | ORAL_TABLET | Freq: Every day | ORAL | 0 refills | Status: DC

## 2019-05-21 NOTE — Telephone Encounter (Signed)
Please advise 

## 2019-05-21 NOTE — Telephone Encounter (Signed)
Copied from [place] [phone]. Topic: General - Other >> May 21, 2019 12:28 PM Antonieta Iba C wrote: Reason for CRM: pt's daughter called in to request to have a Rx for prednisone sent in to pharmacy. Daughter says that usually when pt has some wheezing provider sends it in for pt.   Pharmacy: [id number] - [place], [place] [address]  Phone:  [phone] Fax:  [phone]

## 2019-05-21 NOTE — Telephone Encounter (Signed)
Spoke w/ Pt's daughter [name]- informed of recommendations. [name] verbalized understanding.  Prednisone 20mg  [facility].

## 2019-05-21 NOTE — Telephone Encounter (Signed)
Unfortunately, we have no openings to see her. She has a history of asthma. Advise patient's daughter: If she has severe symptoms, chest pain, difficulty breathing, fever or chills: Go to the ER or urgent care Send a prescription for prednisone 20 mg 1 tablet daily #5 no refills. Use Robitussin-DM as needed. Use albuterol nebs every 6 hours as needed. Also, make her aware that if the patient is gaining weight, have leg swelling: Wheezing may be related to her heart. Call  if not gradually improving

## 2019-05-22 ENCOUNTER — Observation Stay (HOSPITAL_COMMUNITY)
Admission: EM | Admit: 2019-05-22 | Discharge: 2019-05-23 | Disposition: A | Discharge status: Home / Self Care | Payer: Medicare Other | Attending: Internal Medicine | Admitting: Internal Medicine

## 2019-05-22 ENCOUNTER — Emergency Department (HOSPITAL_COMMUNITY): Payer: Medicare Other

## 2019-05-22 ENCOUNTER — Encounter (HOSPITAL_COMMUNITY): Payer: Self-pay | Admitting: *Deleted

## 2019-05-22 ENCOUNTER — Observation Stay (HOSPITAL_COMMUNITY): Payer: Medicare Other

## 2019-05-22 ENCOUNTER — Other Ambulatory Visit: Payer: Self-pay

## 2019-05-22 DIAGNOSIS — K219 Gastro-esophageal reflux disease without esophagitis: Secondary | ICD-10-CM | POA: Insufficient documentation

## 2019-05-22 DIAGNOSIS — Z87891 Personal history of nicotine dependence: Secondary | ICD-10-CM | POA: Insufficient documentation

## 2019-05-22 DIAGNOSIS — E0590 Thyrotoxicosis, unspecified without thyrotoxic crisis or storm: Secondary | ICD-10-CM | POA: Diagnosis not present

## 2019-05-22 DIAGNOSIS — E119 Type 2 diabetes mellitus without complications: Secondary | ICD-10-CM | POA: Diagnosis present

## 2019-05-22 DIAGNOSIS — Z20828 Contact with and (suspected) exposure to other viral communicable diseases: Secondary | ICD-10-CM | POA: Diagnosis not present

## 2019-05-22 DIAGNOSIS — Z79899 Other long term (current) drug therapy: Secondary | ICD-10-CM | POA: Insufficient documentation

## 2019-05-22 DIAGNOSIS — I482 Chronic atrial fibrillation, unspecified: Secondary | ICD-10-CM | POA: Diagnosis not present

## 2019-05-22 DIAGNOSIS — I639 Cerebral infarction, unspecified: Secondary | ICD-10-CM

## 2019-05-22 DIAGNOSIS — G459 Transient cerebral ischemic attack, unspecified: Principal | ICD-10-CM | POA: Insufficient documentation

## 2019-05-22 DIAGNOSIS — Z8249 Family history of ischemic heart disease and other diseases of the circulatory system: Secondary | ICD-10-CM | POA: Insufficient documentation

## 2019-05-22 DIAGNOSIS — I4819 Other persistent atrial fibrillation: Secondary | ICD-10-CM

## 2019-05-22 DIAGNOSIS — Z7901 Long term (current) use of anticoagulants: Secondary | ICD-10-CM | POA: Diagnosis not present

## 2019-05-22 DIAGNOSIS — I25810 Atherosclerosis of coronary artery bypass graft(s) without angina pectoris: Secondary | ICD-10-CM | POA: Insufficient documentation

## 2019-05-22 DIAGNOSIS — R109 Unspecified abdominal pain: Secondary | ICD-10-CM | POA: Insufficient documentation

## 2019-05-22 DIAGNOSIS — Z96659 Presence of unspecified artificial knee joint: Secondary | ICD-10-CM | POA: Diagnosis not present

## 2019-05-22 DIAGNOSIS — I110 Hypertensive heart disease with heart failure: Secondary | ICD-10-CM | POA: Diagnosis not present

## 2019-05-22 DIAGNOSIS — J45909 Unspecified asthma, uncomplicated: Secondary | ICD-10-CM | POA: Diagnosis not present

## 2019-05-22 DIAGNOSIS — R29818 Other symptoms and signs involving the nervous system: Secondary | ICD-10-CM | POA: Diagnosis not present

## 2019-05-22 DIAGNOSIS — R2990 Unspecified symptoms and signs involving the nervous system: Secondary | ICD-10-CM

## 2019-05-22 DIAGNOSIS — I4891 Unspecified atrial fibrillation: Secondary | ICD-10-CM | POA: Diagnosis not present

## 2019-05-22 DIAGNOSIS — Z952 Presence of prosthetic heart valve: Secondary | ICD-10-CM | POA: Diagnosis not present

## 2019-05-22 DIAGNOSIS — E782 Mixed hyperlipidemia: Secondary | ICD-10-CM | POA: Diagnosis present

## 2019-05-22 DIAGNOSIS — R4701 Aphasia: Secondary | ICD-10-CM | POA: Diagnosis not present

## 2019-05-22 DIAGNOSIS — K5730 Diverticulosis of large intestine without perforation or abscess without bleeding: Secondary | ICD-10-CM | POA: Diagnosis not present

## 2019-05-22 DIAGNOSIS — E876 Hypokalemia: Secondary | ICD-10-CM | POA: Insufficient documentation

## 2019-05-22 DIAGNOSIS — I10 Essential (primary) hypertension: Secondary | ICD-10-CM | POA: Diagnosis present

## 2019-05-22 DIAGNOSIS — E1165 Type 2 diabetes mellitus with hyperglycemia: Secondary | ICD-10-CM | POA: Diagnosis not present

## 2019-05-22 DIAGNOSIS — F0390 Unspecified dementia without behavioral disturbance: Secondary | ICD-10-CM | POA: Insufficient documentation

## 2019-05-22 DIAGNOSIS — J90 Pleural effusion, not elsewhere classified: Secondary | ICD-10-CM | POA: Diagnosis not present

## 2019-05-22 DIAGNOSIS — F03918 Unspecified dementia, unspecified severity, with other behavioral disturbance: Secondary | ICD-10-CM | POA: Diagnosis present

## 2019-05-22 DIAGNOSIS — E1159 Type 2 diabetes mellitus with other circulatory complications: Secondary | ICD-10-CM

## 2019-05-22 DIAGNOSIS — N281 Cyst of kidney, acquired: Secondary | ICD-10-CM | POA: Diagnosis not present

## 2019-05-22 DIAGNOSIS — R410 Disorientation, unspecified: Secondary | ICD-10-CM | POA: Diagnosis not present

## 2019-05-22 DIAGNOSIS — I5032 Chronic diastolic (congestive) heart failure: Secondary | ICD-10-CM | POA: Diagnosis not present

## 2019-05-22 DIAGNOSIS — E785 Hyperlipidemia, unspecified: Secondary | ICD-10-CM | POA: Diagnosis not present

## 2019-05-22 DIAGNOSIS — R0789 Other chest pain: Secondary | ICD-10-CM | POA: Diagnosis not present

## 2019-05-22 DIAGNOSIS — G9340 Encephalopathy, unspecified: Secondary | ICD-10-CM | POA: Diagnosis not present

## 2019-05-22 DIAGNOSIS — Z7951 Long term (current) use of inhaled steroids: Secondary | ICD-10-CM | POA: Diagnosis not present

## 2019-05-22 DIAGNOSIS — Z66 Do not resuscitate: Secondary | ICD-10-CM | POA: Insufficient documentation

## 2019-05-22 DIAGNOSIS — R079 Chest pain, unspecified: Secondary | ICD-10-CM | POA: Diagnosis not present

## 2019-05-22 DIAGNOSIS — Z743 Need for continuous supervision: Secondary | ICD-10-CM | POA: Diagnosis not present

## 2019-05-22 DIAGNOSIS — R1084 Generalized abdominal pain: Secondary | ICD-10-CM | POA: Diagnosis not present

## 2019-05-22 DIAGNOSIS — I6523 Occlusion and stenosis of bilateral carotid arteries: Secondary | ICD-10-CM | POA: Diagnosis not present

## 2019-05-22 LAB — COMPREHENSIVE METABOLIC PANEL
ALT: 23 U/L (ref 0–44)
AST: 32 U/L (ref 15–41)
Albumin: 3.8 g/dL (ref 3.5–5.0)
Alkaline Phosphatase: 98 U/L (ref 38–126)
Anion gap: 13 (ref 5–15)
BUN: 19 mg/dL (ref 8–23)
CO2: 29 mmol/L (ref 22–32)
Calcium: 9.2 mg/dL (ref 8.9–10.3)
Chloride: 104 mmol/L (ref 98–111)
Creatinine, Ser: 1 mg/dL (ref 0.44–1.00)
GFR calc Af Amer: 56 mL/min — ABNORMAL LOW (ref >60)
GFR calc non Af Amer: 48 mL/min — ABNORMAL LOW (ref >60)
Glucose, Bld: 140 mg/dL — ABNORMAL HIGH (ref 70–99)
Potassium: 3.4 mmol/L — ABNORMAL LOW (ref 3.5–5.1)
Sodium: 146 mmol/L — ABNORMAL HIGH (ref 135–145)
Total Bilirubin: 0.5 mg/dL (ref 0.3–1.2)
Total Protein: 7.2 g/dL (ref 6.5–8.1)

## 2019-05-22 LAB — DIFFERENTIAL
Abs Immature Granulocytes: 0.02 10*3/uL (ref 0.00–0.07)
Basophils Absolute: 0 10*3/uL (ref 0.0–0.1)
Basophils Relative: 0 %
Eosinophils Absolute: 0 10*3/uL (ref 0.0–0.5)
Eosinophils Relative: 0 %
Immature Granulocytes: 0 %
Lymphocytes Relative: 25 %
Lymphs Abs: 2.4 10*3/uL (ref 0.7–4.0)
Monocytes Absolute: 1.2 10*3/uL — ABNORMAL HIGH (ref 0.1–1.0)
Monocytes Relative: 13 %
Neutro Abs: 5.7 10*3/uL (ref 1.7–7.7)
Neutrophils Relative %: 62 %

## 2019-05-22 LAB — I-STAT CHEM 8, ED
BUN: 21 mg/dL (ref 8–23)
Calcium, Ion: 1.1 mmol/L — ABNORMAL LOW (ref 1.15–1.40)
Chloride: 105 mmol/L (ref 98–111)
Creatinine, Ser: 0.9 mg/dL (ref 0.44–1.00)
Glucose, Bld: 161 mg/dL — ABNORMAL HIGH (ref 70–99)
HCT: 37 % (ref 36.0–46.0)
Hemoglobin: 12.6 g/dL (ref 12.0–15.0)
Potassium: 3.4 mmol/L — ABNORMAL LOW (ref 3.5–5.1)
Sodium: 146 mmol/L — ABNORMAL HIGH (ref 135–145)
TCO2: 29 mmol/L (ref 22–32)

## 2019-05-22 LAB — CBC
HCT: 36.3 % (ref 36.0–46.0)
Hemoglobin: 11.6 g/dL — ABNORMAL LOW (ref 12.0–15.0)
MCH: 32 pg (ref 26.0–34.0)
MCHC: 32 g/dL (ref 30.0–36.0)
MCV: 100 fL (ref 80.0–100.0)
Platelets: 146 10*3/uL — ABNORMAL LOW (ref 150–400)
RBC: 3.63 MIL/uL — ABNORMAL LOW (ref 3.87–5.11)
RDW: 14.9 % (ref 11.5–15.5)
WBC: 9.3 10*3/uL (ref 4.0–10.5)
nRBC: 0 % (ref 0.0–0.2)

## 2019-05-22 LAB — MAGNESIUM: Magnesium: 2 mg/dL (ref 1.7–2.4)

## 2019-05-22 LAB — PROTIME-INR
INR: 1.4 — ABNORMAL HIGH (ref 0.8–1.2)
Prothrombin Time: 17.4 seconds — ABNORMAL HIGH (ref 11.4–15.2)

## 2019-05-22 LAB — CBG MONITORING, ED: Glucose-Capillary: 122 mg/dL — ABNORMAL HIGH (ref 70–99)

## 2019-05-22 LAB — APTT: aPTT: 39 seconds — ABNORMAL HIGH (ref 24–36)

## 2019-05-22 MED ORDER — IOHEXOL 350 MG/ML SOLN
100.0000 mL | Freq: Once | INTRAVENOUS | Status: AC | PRN
  Administered 2019-05-22: 100 mL via INTRAVENOUS

## 2019-05-22 MED ORDER — IOHEXOL 350 MG/ML SOLN
125.0000 mL | Freq: Once | INTRAVENOUS | Status: AC | PRN
  Administered 2019-05-22: 125 mL via INTRAVENOUS

## 2019-05-22 MED ORDER — POTASSIUM CHLORIDE CRYS ER 20 MEQ PO TBCR
20.0000 meq | EXTENDED_RELEASE_TABLET | Freq: Once | ORAL | Status: AC
  Administered 2019-05-22: 20 meq via ORAL
  Filled 2019-05-22: qty 1

## 2019-05-22 MED ORDER — ALBUTEROL SULFATE (2.5 MG/3ML) 0.083% IN NEBU: 3.0000 mL | INHALATION_SOLUTION | RESPIRATORY_TRACT | Status: DC | PRN

## 2019-05-22 MED ORDER — SODIUM CHLORIDE 0.9% FLUSH: 3.0000 mL | Freq: Once | INTRAVENOUS | Status: DC

## 2019-05-22 NOTE — ED Triage Notes (Signed)
Pt arrived with EMS as a Code Stroke with sudden onset of confusion, aphasia, cp/abd pain; LSN initially reported LSN at 1800. At baseline per ems pt is A&Ox4, ambulatory, went out to have a manicure today and lunch. Pt is alert, following commands.   Dr.Kirkpatrick spoke with family; pt laid down for a nap at 1700, completely at baseline, woke at 1800 with confusion. 20g IV to L forearm

## 2019-05-22 NOTE — H&P (Signed)
Rebecca Skinner [id number] DOB: [birth year] DOA: 05/22/2019     PCP: Colon Branch, MD   Outpatient Specialists:   CARDS:   [facility] Post PA/Dr. Claiborne Billings   NEurology  Dr. [name]     Patient arrived to ER on 05/22/19 at 1934  Patient coming from: home Lives   With family    Chief Complaint:  Chief Complaint  Patient presents with  . Code Stroke  confusion and aphasia  HPI: Rebecca Skinner is a 85 y.o. female with medical history significant of  history of multiple CVA, PAF on Eliquis,chronic diastolic CHF, CAD s/p CABG x1 SVG to RCA and aortic valve replacement 1998, HTN, HLD, DM 2,GERD GERD, and hypothyroidism, sick sinus syndrome.  Dimensia    Presented with   patient laid down for nap around 5 PM at that time she was at her baseline.  When she woke up about an hour later at 6 PM she was confused and had aphasia.  Patient was taken to the emergency department [facility] code stroke was called on arrival NIH score 5 CBG 161 initial CT scan head unremarkable TPA not given patient already on Eliquis.  Patient has underlying dementia and unable to provide detailed history at her baseline she does not always recognize people and usually talks about events that happened 20 years ago.  She is able to have a minimal conversation her baseline walks with a push walker.  Family reported she have had chest and Abdominal pain pt was unable to confirm. Currently symptoms have resolved, per Daughter pt is back to baseline  Last time of hospitalization for stroke in 2015 at that time her aspirin Plavix was discontinued and she was started on Eliquis but developed significant anemia and rectal bleeding in October 2015 requiring 2 units of blood transfusion.  Colonoscopy at that time showed AVMs Initially she was switched to Aspirin only but In 2018 she had a recurrent TIA with aphasia after which she was switched back to Eliquis and aspirin was discontinued  Infectious risk factors:  Reports none     In  ER  RAPID COVID TEST   in house  PCR testing  Pending  No results found for: SARSCOV2NAA   Regarding pertinent Chronic problems:     Hyperlipidemia -  on statins Lipitor   HTN on Norvasc benazepril Imdur Lopressor   chronic CHF diastolic  - last echo [id number]  EF 60 to 65%, mild LVH, mild to moderate LAE, moderate RAE, moderate to severe TR, normal aortic valve prosthetic function.   On daily Lasix with additional 20 mg as needed    CAD  - On   Statin,                 -  followed by cardiology DM 2 -  Lab Results  Component Value Date   HGBA1C 7.2 (H) 04/08/2019   diet controlled  History of hyperthyroidism on Tapazole  Asthma -well controlled on home inhalers    Hx of CVA -  with/out residual deficits on Eliquis   A. Fib -  - CHA2DS2 vas score 6 :  current  on anticoagulation with  Eliquis,           -  Rate control:  Currently controlled with Metoprolol,      While in ER: Initial code stroke was called patient was not a candidate for TPA she already on Eliquis   ER Provider Called: Neurology    Dr. Leonel Ramsay  They Recommend admit to medicine order CTA and MRI Recommending restarting aspirin on hold Eliquis until no other signs of a stroke Will see   in ER   The following Work up has been ordered so far:  Orders Placed This Encounter  Procedures  . SARS CORONAVIRUS 2 (TAT 6-24 HRS) Nasopharyngeal Nasopharyngeal Swab  . CT HEAD CODE STROKE WO CONTRAST  . CT Code Stroke CTA Head W/WO contrast  . CT Code Stroke CTA Neck W/WO contrast  . CT Chest W Contrast  . CT ABDOMEN PELVIS W CONTRAST  . Protime-INR  . APTT  . CBC  . Differential  . Comprehensive metabolic panel  . Diet NPO time specified  . Cardiac monitoring  . Stroke swallow screen  . NIH Stroke Scale  . Modified Stroke Scale (mNIHSS) Document mNIHSS assessment every 2 hours for a total of 12 hours  . Saline Lock IV, Maintain IV access  . If O2 Sat <94% administer O2 at 2 liters/minute via nasal  cannula  . Consult to hospitalist  ALL PATIENTS BEING ADMITTED/HAVING PROCEDURES NEED COVID-19 SCREENING  . Pulse oximetry, continuous  . I-stat chem 8, ED  . CBG monitoring, ED  . ED EKG  . EKG 12-Lead  . EKG 12-Lead    Following Medications were ordered in ER: Medications  sodium chloride flush (NS) 0.9 % injection 3 mL (has no administration in time range)  iohexol (OMNIPAQUE) 350 MG/ML injection 125 mL (125 mLs Intravenous Contrast Given 05/22/19 1956)  iohexol (OMNIPAQUE) 350 MG/ML injection 100 mL (100 mLs Intravenous Contrast Given 05/22/19 2002)        Consult Orders  (From admission, onward)         Start     Ordered   05/22/19 2056  Consult to hospitalist  ALL PATIENTS BEING ADMITTED/HAVING PROCEDURES NEED COVID-19 SCREENING Pg sent by dee  Once    Comments: ALL PATIENTS BEING ADMITTED/HAVING PROCEDURES NEED COVID-19 SCREENING  Provider:  (Not yet assigned)  Question Answer Comment  Place call to: [facility]   Reason for Consult Admit      05/22/19 2055           Significant initial  Findings: Abnormal Labs Reviewed  PROTIME-INR - Abnormal; Notable for the following components:      Result Value   Prothrombin Time 17.4 (*)    INR 1.4 (*)    All other components within normal limits  APTT - Abnormal; Notable for the following components:   aPTT 39 (*)    All other components within normal limits  CBC - Abnormal; Notable for the following components:   RBC 3.63 (*)    Hemoglobin 11.6 (*)    Platelets 146 (*)    All other components within normal limits  DIFFERENTIAL - Abnormal; Notable for the following components:   Monocytes Absolute 1.2 (*)    All other components within normal limits  COMPREHENSIVE METABOLIC PANEL - Abnormal; Notable for the following components:   Sodium 146 (*)    Potassium 3.4 (*)    Glucose, Bld 140 (*)    GFR calc non Af Amer 48 (*)    GFR calc Af Amer 56 (*)    All other components within normal limits  I-STAT  CHEM 8, ED - Abnormal; Notable for the following components:   Sodium 146 (*)    Potassium 3.4 (*)    Glucose, Bld 161 (*)    Calcium, Ion 1.10 (*)    All other components  within normal limits  CBG MONITORING, ED - Abnormal; Notable for the following components:   Glucose-Capillary 122 (*)    All other components within normal limits     Otherwise labs showing:    Recent Labs  Lab 05/22/19 1951 05/22/19 2007  NA 146* 146*  K 3.4* 3.4*  CO2  --  29  GLUCOSE 161* 140*  BUN 21 19  CREATININE 0.90 1.00  CALCIUM  --  9.2    Cr     stable,   Lab Results  Component Value Date   CREATININE 1.00 05/22/2019   CREATININE 0.90 05/22/2019   CREATININE 0.95 04/08/2019    Recent Labs  Lab 05/22/19 2007  AST 32  ALT 23  ALKPHOS 98  BILITOT 0.5  PROT 7.2  ALBUMIN 3.8   Lab Results  Component Value Date   CALCIUM 9.2 05/22/2019   PHOS 3.7 09/13/2015      WBC       Component Value Date/Time   WBC 9.3 05/22/2019 2007   ANC    Component Value Date/Time   NEUTROABS 5.7 05/22/2019 2007   ALC No components found for: LYMPHAB    Plt: Lab Results  Component Value Date   PLT 146 (L) 05/22/2019    HG/HCT  stable,       Component Value Date/Time   HGB 11.6 (L) 05/22/2019 2007   HCT 36.3 05/22/2019 2007      ECG: Ordered Personally reviewed by me showing: HR : 92 Rhythm:   A.fib.     no evidence of ischemic changes QTC 478   BNP (last 3 results) No results for input(s): BNP in the last 8760 hours.  ProBNP (last 3 results) No results for input(s): PROBNP in the last 8760 hours.  DM  labs:  HbA1C: Recent Labs    12/14/18 0814 04/08/19 1526  HGBA1C 6.9* 7.2*     CBG (last 3)  Recent Labs    05/22/19 2026  GLUCAP 122*       UA   ordered   Urine analysis:    Component Value Date/Time   COLORURINE YELLOW 01/30/2017 [place] 01/30/2017 1229   LABSPEC 1.010 01/30/2017 1229   PHURINE 7.0 01/30/2017 [place]  01/30/2017 [place] 01/30/2017 1229   HGBUR negative 12/18/2009 [place] 01/30/2017 1229   BILIRUBINUR Neg 11/20/2011 [place] 01/30/2017 1229   PROTEINUR 30 (A) 03/05/2016 1540   UROBILINOGEN 0.2 01/30/2017 1229   NITRITE NEGATIVE 01/30/2017 1229   LEUKOCYTESUR NEGATIVE 01/30/2017 1229       Ordered  CT HEAD   NON acute CT angio head and neck-50% stenosis of proximal right and left internal carotid artery    CTabd/pelvis -  nonacute  CTA chest -  nonacute,    no evidence of infiltrate      ED Triage Vitals  Enc Vitals Group     BP 05/22/19 2011 (!) 177/97     Pulse Rate 05/22/19 2011 91     Resp 05/22/19 2011 (!) 21     Temp 05/22/19 2024 98.2 F (36.8 C)     Temp Source 05/22/19 2024 Oral     SpO2 05/22/19 2011 95 %     Weight --      Height --      Head Circumference --      Peak Flow --      Pain Score --  Pain Loc --      Pain Edu? --      Excl. in [place]? --   TMAX(24)@       Latest  Blood pressure (!) 139/93, pulse 91, temperature 98.2 F (36.8 C), temperature source Oral, resp. rate 18, SpO2 99 %.    Hospitalist was called for admission for CVA   Review of Systems:    Pertinent positives include: confusion, aphasia  Constitutional:  No weight loss, night sweats, Fevers, chills, fatigue, weight loss  HEENT:  No headaches, Difficulty swallowing,Tooth/dental problems,Sore throat,  No sneezing, itching, ear ache, nasal congestion, post nasal drip,  Cardio-vascular:  No chest pain, Orthopnea, PND, anasarca, dizziness, palpitations.no Bilateral lower extremity swelling  GI:  No heartburn, indigestion, abdominal pain, nausea, vomiting, diarrhea, change in bowel habits, loss of appetite, melena, blood in stool, hematemesis Resp:  no shortness of breath at rest. No dyspnea on exertion, No excess mucus, no productive cough, No non-productive cough, No coughing up of blood.No change in color of mucus.No  wheezing. Skin:  no rash or lesions. No jaundice GU:  no dysuria, change in color of urine, no urgency or frequency. No straining to urinate.  No flank pain.  Musculoskeletal:  No joint pain or no joint swelling. No decreased range of motion. No back pain.  Psych:  No change in mood or affect. No depression or anxiety. No memory loss.  Neuro: no localizing neurological complaints, no tingling, no weakness, no double vision, no gait abnormality, no slurred speech,   All systems reviewed and apart from Herman all are negative  Past Medical History:   Past Medical History:  Diagnosis Date  . Anxiety 11/20/2011  . Asthma    UNDER THE CARE [facility] DR PAZ  . CAD (coronary artery disease)    s/p CABG s/p AO valve replacement ([place] [facility])  . CVA (cerebral infarction) 08-2013   multiple, L, d/t Afib, started eloquis  . Diabetes mellitus    type 2   . Dizziness    Chronic, admiet 07-2010,saw neuro, thought to be a peripheral issue   . Dry skin   . GERD (gastroesophageal reflux disease)   . Hyperlipidemia   . Hypertension   . Hyperthyroidism   . Keloid    @ chest  . Memory loss   . Osteoarthritis   . Osteopenia    per dexa 12/09  . Paroxysmal atrial fibrillation ([place])    cards d/c coumadin 12/2008 d/t persisten NSR and frequent falls- restarted coumadin july 2011, now on Eliquis  . Recurrent UTI   . Shortness of breath dyspnea   . TIA (transient ischemic attack)       Past Surgical History:  Procedure Laterality Date  . ABDOMINAL HYSTERECTOMY    . AORTIC VALVE REPLACEMENT  1998  . CARDIAC VALVE REPLACEMENT    . CAROTID DOPPLER  07/13/12   BILATERAL BULB/PROXIMAL ICAS;MILD AMOUNT OF FIBROUS PLAQUE WITH NO DIAMETER REDUCTION.  . CESAREAN SECTION     x 2  . COLONOSCOPY WITH PROPOFOL N/A 03/30/2014   Procedure: COLONOSCOPY WITH PROPOFOL;  Surgeon: Irene Shipper, MD;  Location: [facility];  Service: Endoscopy;  Laterality: N/A;  . CORONARY ARTERY BYPASS GRAFT  1998   SVG TO RCA   . HEMORRHOID SURGERY    . JOINT REPLACEMENT    . MYOCARDIAL PERFUSION STUDY  12/19/09   NORMAL PATTERN OF PERFUSION IN ALL REGIONS.EF 75%.  . OOPHORECTOMY    . TOTAL KNEE ARTHROPLASTY  1999  .  TRANSTHORACIC ECHOCARDIOGRAM  09/11/11   LVEF >55%.STAGE 1 DIASTOLIC DYSFUNCTION,ELEVATED LV FILLING PRESSURE.BIOPROSTHETIC AORTIC VALVE-PEAK AND MEAN GRADIENTS OF 17 MMHG AND 8 MMHG.SIGMOID SEPTUM.MILD TO MOD MR.MILD TO MOD TR.RVSP 46 MMHG.    Social History:  Ambulatory   walker      reports that she has quit smoking. She has never used smokeless tobacco. She reports that she does not drink alcohol or use drugs.   Family History:   Family History  Problem Relation Age of Onset  . Heart attack Father   . Coronary artery disease Son 71       deceased  . Hypertension Brother   . Stroke Brother   . Colon cancer Neg Hx   . Breast cancer Neg Hx   . Thyroid disease Neg Hx     Allergies: Allergies  Allergen Reactions  . Hydrocodone Itching and Nausea Only  . Tramadol Hcl Itching and Nausea Only     Prior to Admission medications   Medication Sig Start Date End Date Taking? Authorizing Provider  acetaminophen (TYLENOL) 500 MG tablet Take 1,000 mg by mouth every 6 (six) hours as needed (for pain).     [provider]  albuterol (ACCUNEB) 0.63 MG/3ML nebulizer solution Take 3 mLs by nebulization every 6 (six) hours as needed for wheezing or shortness of breath. 04/21/18   Colon Branch, MD  ALPRAZolam Duanne Moron) 0.25 MG tablet TAKE 1/2 TABLET OR TAKE 1 TABLET BY MOUTH AT NIGHT AS NEEDED FOR INSOMINIA 11/11/18   Colon Branch, MD  amLODipine (NORVASC) 10 MG tablet Take 1 tablet (10 mg total) by mouth daily. 12/08/18   Colon Branch, MD  apixaban (ELIQUIS) 5 MG TABS tablet Take 1 tablet (5 mg total) by mouth 2 (two) times daily. 10/19/18   Colon Branch, MD  atorvastatin (LIPITOR) 40 MG tablet Take 1 tablet (40 mg total) by mouth at bedtime. 07/03/18   Colon Branch, MD  benazepril (LOTENSIN) 40 MG  tablet TAKE 1 TABLET BY MOUTH EVERY DAY 11/12/18   Colon Branch, MD  budesonide (PULMICORT) 0.5 MG/2ML nebulizer solution Take 2 mLs (0.5 mg total) by nebulization daily. 03/26/19   Colon Branch, MD  Calcium Carbonate-Vitamin D (CALCIUM 500 + D) 500-125 MG-UNIT TABS Take 1 tablet by mouth at bedtime.    [provider]  esomeprazole (NEXIUM) 40 MG capsule Take 1 capsule (40 mg total) by mouth daily as needed. Patient taking differently: Take 40 mg by mouth daily as needed (for reflux symptoms).  01/19/18   Colon Branch, MD  furosemide (LASIX) 20 MG tablet Take 2 tablets (40 mg total) by mouth daily. 01/27/19   Colon Branch, MD  isosorbide mononitrate (IMDUR) 30 MG 24 hr tablet TAKE 1 TABLET BY MOUTH EVERY DAY 12/21/18   Troy Sine, MD  magnesium oxide (MAG-OX) 400 MG tablet Take 1 tablet (400 mg total) by mouth daily. 03/15/19   Colon Branch, MD  methimazole (TAPAZOLE) 5 MG tablet TAKE 1 TABLET BY MOUTH 3 TIMES A WEEK 09/23/18   Colon Branch, MD  metoprolol tartrate (LOPRESSOR) 50 MG tablet Take 1 tablet (50 mg total) by mouth 2 (two) times daily. 01/11/19   Colon Branch, MD  nitroGLYCERIN (NITROSTAT) 0.4 MG SL tablet Place 1 tablet (0.4 mg total) under the tongue every 5 (five) minutes x 3 doses as needed for chest pain. 12/13/16   Colon Branch, MD  potassium chloride (KLOR-CON M10) 10 MEQ  tablet Take 1 tablet (10 mEq total) by mouth daily. 01/11/19   Colon Branch, MD  predniSONE (DELTASONE) 20 MG tablet Take 1 tablet (20 mg total) by mouth daily with breakfast. 05/21/19   Colon Branch, MD  Calcium Carbonate (CALCIUM 500 PO) Take 500 mg by mouth at bedtime. Complex vitamin  04/08/19  [provider]   Physical Exam: Blood pressure (!) 139/93, pulse 91, temperature 98.2 F (36.8 C), temperature source Oral, resp. rate 18, SpO2 99 %. 1. General:  in No  Acute distress    Chronically ill -appearing 2. Psychological: Alert and   Oriented 3. Head/ENT:   M  Dry Mucous Membranes                           Head Non traumatic, neck supple                           Poor Dentition 4. SKIN:  decreased Skin turgor,  Skin clean Dry and intact no rash 5. Heart: Regular rate and rhythm no Murmur, no Rub or gallop 6. Lungs:  no wheezes or crackles   7. Abdomen: Soft,   superficial left upper quadrant tenderness, Non distended  bowel sounds present 8. Lower extremities: no clubbing, cyanosis, trace edema 9. Neurologically   strength 5 out of 5 in all 4 extremities cranial nerves II through XII intact 10. MSK: Normal range of motion   All other LABS:     Recent Labs  Lab 05/22/19 1951 05/22/19 2007  WBC  --  9.3  NEUTROABS  --  5.7  HGB 12.6 11.6*  HCT 37.0 36.3  MCV  --  100.0  PLT  --  146*     Recent Labs  Lab 05/22/19 1951 05/22/19 2007  NA 146* 146*  K 3.4* 3.4*  CL 105 104  CO2  --  29  GLUCOSE 161* 140*  BUN 21 19  CREATININE 0.90 1.00  CALCIUM  --  9.2     Recent Labs  Lab 05/22/19 2007  AST 32  ALT 23  ALKPHOS 98  BILITOT 0.5  PROT 7.2  ALBUMIN 3.8      Cultures:    Component Value Date/Time   SDES URINE, CATHETERIZED 03/05/2016 1540   SDES URINE, CLEAN CATCH 03/05/2016 1540   SPECREQUEST Normal 03/05/2016 1540   SPECREQUEST NONE 03/05/2016 1540   CULT MULTIPLE SPECIES PRESENT, SUGGEST RECOLLECTION (A) 03/05/2016 1540   REPTSTATUS 03/06/2016 FINAL 03/05/2016 1540   REPTSTATUS 03/05/2016 FINAL 03/05/2016 1540     Radiological Exams on Admission: CT Code Stroke CTA Head W/WO contrast  Result Date: 05/22/2019 CLINICAL DATA:  Focal neuro deficit. History atrial fibrillation. EXAM: CT ANGIOGRAPHY HEAD AND NECK TECHNIQUE: Multidetector CT imaging of the head and neck was performed using the standard protocol during bolus administration of intravenous contrast. Multiplanar CT image reconstructions and MIPs were obtained to evaluate the vascular anatomy. Carotid stenosis measurements (when applicable) are obtained utilizing NASCET criteria, using the distal  internal carotid diameter as the denominator. CONTRAST:  150mL OMNIPAQUE IOHEXOL 350 MG/ML SOLN COMPARISON:  CT head 05/22/2019 FINDINGS: CTA NECK FINDINGS Aortic arch: Standard branching. Imaged portion shows no evidence of aneurysm or dissection. No significant stenosis of the major arch vessel origins. Atherosclerotic calcification aortic arch and proximal great vessels without significant stenosis. Right carotid system: Atherosclerotic disease right carotid bifurcation. Approximately 50% diameter stenosis proximal right  internal carotid artery. Tortuosity of the right internal and common carotid arteries. Left carotid system: Atherosclerotic calcification left carotid bifurcation narrowing the lumen by approximately 50% diameter stenosis. Tortuous left carotid. Vertebral arteries: Right vertebral artery dominant and widely patent. Small left vertebral artery with origin from the aortic arch. Mild atherosclerotic disease proximally. Hypoplastic distal V4 segment. Skeleton: Median sternotomy. Cervical spondylosis. No acute skeletal abnormality. Other neck: Heterogeneous goiter. No mass in the neck. No adenopathy Upper chest: Lung apices clear bilaterally. Small right pleural effusion. Review of the MIP images confirms the above findings CTA HEAD FINDINGS Anterior circulation: Atherosclerotic calcification in the cavernous carotid bilaterally. Moderate stenosis right cavernous carotid and mild stenosis left cavernous carotid. Anterior and middle cerebral arteries patent bilaterally without stenosis. Posterior circulation: Right vertebral artery is the primary supply the basilar. Hypoplastic distal left vertebral artery. PICA patent bilaterally. Basilar patent. Superior cerebellar and posterior cerebral arteries patent bilaterally without stenosis. Venous sinuses: Normal venous enhancement Anatomic variants: None Review of the MIP images confirms the above findings IMPRESSION: 1. 50% diameter stenosis proximal right  internal carotid artery. 2. 50% diameter stenosis proximal left internal carotid artery. 3. Right vertebral artery dominant and widely patent. Small left vertebral artery with origin from the aortic arch. Hypoplastic distal left vertebral artery. 4. Moderate stenosis right cavernous carotid and mild stenosis left cavernous carotid. 5. No significant intracranial stenosis or large vessel occlusion. Electronically Signed   By: Franchot Gallo M.D.   On: 05/22/2019 20:45   CT Code Stroke CTA Neck W/WO contrast  Result Date: 05/22/2019 CLINICAL DATA:  Focal neuro deficit. History atrial fibrillation. EXAM: CT ANGIOGRAPHY HEAD AND NECK TECHNIQUE: Multidetector CT imaging of the head and neck was performed using the standard protocol during bolus administration of intravenous contrast. Multiplanar CT image reconstructions and MIPs were obtained to evaluate the vascular anatomy. Carotid stenosis measurements (when applicable) are obtained utilizing NASCET criteria, using the distal internal carotid diameter as the denominator. CONTRAST:  163mL OMNIPAQUE IOHEXOL 350 MG/ML SOLN COMPARISON:  CT head 05/22/2019 FINDINGS: CTA NECK FINDINGS Aortic arch: Standard branching. Imaged portion shows no evidence of aneurysm or dissection. No significant stenosis of the major arch vessel origins. Atherosclerotic calcification aortic arch and proximal great vessels without significant stenosis. Right carotid system: Atherosclerotic disease right carotid bifurcation. Approximately 50% diameter stenosis proximal right internal carotid artery. Tortuosity of the right internal and common carotid arteries. Left carotid system: Atherosclerotic calcification left carotid bifurcation narrowing the lumen by approximately 50% diameter stenosis. Tortuous left carotid. Vertebral arteries: Right vertebral artery dominant and widely patent. Small left vertebral artery with origin from the aortic arch. Mild atherosclerotic disease proximally.  Hypoplastic distal V4 segment. Skeleton: Median sternotomy. Cervical spondylosis. No acute skeletal abnormality. Other neck: Heterogeneous goiter. No mass in the neck. No adenopathy Upper chest: Lung apices clear bilaterally. Small right pleural effusion. Review of the MIP images confirms the above findings CTA HEAD FINDINGS Anterior circulation: Atherosclerotic calcification in the cavernous carotid bilaterally. Moderate stenosis right cavernous carotid and mild stenosis left cavernous carotid. Anterior and middle cerebral arteries patent bilaterally without stenosis. Posterior circulation: Right vertebral artery is the primary supply the basilar. Hypoplastic distal left vertebral artery. PICA patent bilaterally. Basilar patent. Superior cerebellar and posterior cerebral arteries patent bilaterally without stenosis. Venous sinuses: Normal venous enhancement Anatomic variants: None Review of the MIP images confirms the above findings IMPRESSION: 1. 50% diameter stenosis proximal right internal carotid artery. 2. 50% diameter stenosis proximal left internal carotid artery. 3. Right vertebral  artery dominant and widely patent. Small left vertebral artery with origin from the aortic arch. Hypoplastic distal left vertebral artery. 4. Moderate stenosis right cavernous carotid and mild stenosis left cavernous carotid. 5. No significant intracranial stenosis or large vessel occlusion. Electronically Signed   By: Franchot Gallo M.D.   On: 05/22/2019 20:45   CT Chest W Contrast  Result Date: 05/22/2019 CLINICAL DATA:  Shortness of breath, chest pain, abdominal pain EXAM: CT CHEST, ABDOMEN, AND PELVIS WITH CONTRAST TECHNIQUE: Multidetector CT imaging of the chest, abdomen and pelvis was performed following the standard protocol during bolus administration of intravenous contrast. CONTRAST:  119mL OMNIPAQUE IOHEXOL 350 MG/ML SOLN COMPARISON:  07/21/2007 FINDINGS: CT CHEST FINDINGS Cardiovascular: Cardiomegaly. No  pericardial effusion. Thoracic aorta is nonaneurysmal. Extensive atherosclerotic calcifications of the aorta and coronary arteries. Main pulmonary trunk is nondilated. Mediastinum/Nodes: Enlarged, heterogeneous thyroid gland. Trachea and esophagus within normal limits. No axillary, mediastinal, or hilar lymphadenopathy. Lungs/Pleura: Trace right pleural effusion. No focal airspace consolidation. No pneumothorax. Musculoskeletal: Multilevel degenerative changes of the thoracic spine. No fracture or malalignment. Prior median sternotomy. Advanced degenerative changes of the bilateral shoulders. No acute chest wall abnormality. CT ABDOMEN PELVIS FINDINGS Hepatobiliary: 1 cm cyst versus hemangioma at the right hepatic dome. Liver is otherwise unremarkable in appearance. Gallbladder appears unremarkable. No biliary dilatation. Pancreas: Unremarkable. No pancreatic ductal dilatation or surrounding inflammatory changes. Spleen: Normal in size without focal abnormality. Adrenals/Urinary Tract: Unremarkable adrenal glands. Redemonstration of numerous bilateral low-density renal lesions, largest of which emanates from the midpole of the left kidney measuring up to 6.2 cm with internal density compatible with a simple cyst. Many of the additional rounded lesions demonstrate higher internal density and may represent hemorrhagic or proteinaceous material. A solid renal lesion would be difficult to exclude. No hydronephrosis. Urinary bladder appears unremarkable. Stomach/Bowel: Stomach is within normal limits. Scattered colonic diverticulosis. No evidence of bowel wall thickening, distention, or inflammatory changes. Vascular/Lymphatic: Aortic atherosclerosis. No enlarged abdominal or pelvic lymph nodes. Reproductive: Status post hysterectomy. No adnexal masses. Other: No abdominal wall hernia or abnormality. No abdominopelvic ascites. Musculoskeletal: Superior endplate compression deformity of the L1 vertebral body with  approximately 25% height loss, new from prior, although age indeterminate. Multilevel lumbar spondylosis. IMPRESSION: 1. Trace right pleural effusion. 2. Mild superior endplate compression deformity of the L1 vertebral body, new from prior, although age indeterminate. 3. Cardiomegaly. 4. Colonic diverticulosis without evidence of acute diverticulitis. 5. Multiple bilateral renal cystic lesions, some of which likely contain proteinaceous or hemorrhagic contents. A solid renal mass would be difficult to exclude. 6. Enlarged, heterogeneous thyroid gland. 7. Aortic atherosclerosis. Aortic Atherosclerosis (ICD10-I70.0). Electronically Signed   By: Davina Poke M.D.   On: 05/22/2019 20:50   CT ABDOMEN PELVIS W CONTRAST  Result Date: 05/22/2019 CLINICAL DATA:  Shortness of breath, chest pain, abdominal pain EXAM: CT CHEST, ABDOMEN, AND PELVIS WITH CONTRAST TECHNIQUE: Multidetector CT imaging of the chest, abdomen and pelvis was performed following the standard protocol during bolus administration of intravenous contrast. CONTRAST:  160mL OMNIPAQUE IOHEXOL 350 MG/ML SOLN COMPARISON:  07/21/2007 FINDINGS: CT CHEST FINDINGS Cardiovascular: Cardiomegaly. No pericardial effusion. Thoracic aorta is nonaneurysmal. Extensive atherosclerotic calcifications of the aorta and coronary arteries. Main pulmonary trunk is nondilated. Mediastinum/Nodes: Enlarged, heterogeneous thyroid gland. Trachea and esophagus within normal limits. No axillary, mediastinal, or hilar lymphadenopathy. Lungs/Pleura: Trace right pleural effusion. No focal airspace consolidation. No pneumothorax. Musculoskeletal: Multilevel degenerative changes of the thoracic spine. No fracture or malalignment. Prior median sternotomy. Advanced degenerative changes of  the bilateral shoulders. No acute chest wall abnormality. CT ABDOMEN PELVIS FINDINGS Hepatobiliary: 1 cm cyst versus hemangioma at the right hepatic dome. Liver is otherwise unremarkable in  appearance. Gallbladder appears unremarkable. No biliary dilatation. Pancreas: Unremarkable. No pancreatic ductal dilatation or surrounding inflammatory changes. Spleen: Normal in size without focal abnormality. Adrenals/Urinary Tract: Unremarkable adrenal glands. Redemonstration of numerous bilateral low-density renal lesions, largest of which emanates from the midpole of the left kidney measuring up to 6.2 cm with internal density compatible with a simple cyst. Many of the additional rounded lesions demonstrate higher internal density and may represent hemorrhagic or proteinaceous material. A solid renal lesion would be difficult to exclude. No hydronephrosis. Urinary bladder appears unremarkable. Stomach/Bowel: Stomach is within normal limits. Scattered colonic diverticulosis. No evidence of bowel wall thickening, distention, or inflammatory changes. Vascular/Lymphatic: Aortic atherosclerosis. No enlarged abdominal or pelvic lymph nodes. Reproductive: Status post hysterectomy. No adnexal masses. Other: No abdominal wall hernia or abnormality. No abdominopelvic ascites. Musculoskeletal: Superior endplate compression deformity of the L1 vertebral body with approximately 25% height loss, new from prior, although age indeterminate. Multilevel lumbar spondylosis. IMPRESSION: 1. Trace right pleural effusion. 2. Mild superior endplate compression deformity of the L1 vertebral body, new from prior, although age indeterminate. 3. Cardiomegaly. 4. Colonic diverticulosis without evidence of acute diverticulitis. 5. Multiple bilateral renal cystic lesions, some of which likely contain proteinaceous or hemorrhagic contents. A solid renal mass would be difficult to exclude. 6. Enlarged, heterogeneous thyroid gland. 7. Aortic atherosclerosis. Aortic Atherosclerosis (ICD10-I70.0). Electronically Signed   By: Davina Poke M.D.   On: 05/22/2019 20:50   CT HEAD CODE STROKE WO CONTRAST  Result Date: 05/22/2019 CLINICAL  DATA:  Code stroke.  Focal neuro deficit.  Possible stroke EXAM: CT HEAD WITHOUT CONTRAST TECHNIQUE: Contiguous axial images were obtained from the base of the skull through the vertex without intravenous contrast. COMPARISON:  CT head 10/23/2016 FINDINGS: Brain: Chronic infarcts in the right frontal and right parietal lobe unchanged. Chronic infarct in the left frontal lobe also unchanged. This extends down to the insula. Chronic microvascular ischemic changes in the white matter unchanged. Negative for acute infarct, hemorrhage, mass. No midline shift. Generalized atrophy. Vascular: Negative for hyperdense vessel. Skull: Negative Sinuses/Orbits: Paranasal sinuses clear. Cataract surgery on the right. No orbital mass. Other: None ASPECTS ([place] Stroke Program Early CT Score) - Ganglionic level infarction (caudate, lentiform nuclei, internal capsule, insula, M1-M3 cortex): 7 - Supraganglionic infarction (M4-M6 cortex): 3 Total score (0-10 with 10 being normal): 10 IMPRESSION: 1. No acute abnormality. 2. ASPECTS is 10 3. Extensive chronic ischemic changes stable from 2018 4. These results were called by telephone at the time of interpretation on 05/22/2019 at 7:53 pm to provider Leonel Ramsay MD, who verbally acknowledged these results. Electronically Signed   By: Franchot Gallo M.D.   On: 05/22/2019 19:54    Chart has been reviewed    Assessment/Plan  85 y.o. female with medical history significant of  history of multiple CVA, PAF on Eliquis,chronic diastolic CHF, CAD s/p CABG x1 SVG to RCA and aortic valve replacement 1998, HTN, HLD, DM 2,GERD GERD, and hypothyroidism, sick sinus syndrome.  [place] Admitted for possible CVA  Present on Admission: . TIA (transient ischemic attack) - - will admit based on TIA/CVA protocol, Initial Stroke scale 5       Monitor on Tele       MRA/MRI await  results Resulted -        CTA non-acue  Echo to evaluate for possible embolic source,        obtain cardiac  enzymes,  ECG,   Lipid panel, TSH.        Order PT/OT evaluation.        Deep nothing by mouth until passes swallow eval       Will make sure patient is on antiplatelet ASA  325 hold Eliquis until MRi has resulted       Allow permissive Hypertension keep BP <220/120        Neurology consulted Have seen pt in ER   . Hyperthyroidism - chronic stable cont home meds check TSH  . Diabetes mellitus with circulatory complication (HCC) -  - Order Sensitive    SSI   -  check TSH and HgA1C     . Dyslipidemia -   stable continue home medications   . HTN (hypertension) -allow permissive hypertension for tonight  . CAD (coronary artery disease) of artery bypass graft -  - chronic, at baseline was not on aspirin due to history of GI bleeding.  Resuming aspirin for TIA We will continue  Statin resume beta blocker tomorrow  . ATRIAL FIBRILLATION, chronic -           - CHA2DS2 vas score 6 : Would restart Eliquis when neurology approves          -  Rate control:  Currently controlled  Metoprolol, restart tomorrow If blood pressure stable   . Dementia ([place]) chronic anticipate some degree of sundowning  . Chronic diastolic heart failure (HCC) hold off on Lasix for tonight and resume tomorrow avoid fluid overload  . Hypokalemia replace and check magnesium level  Abdominal pain -left upper quadrant very superficial on examination no evidence of cellulitis there is slight tenderness to the fat pad we will continue to observe CT abdomen pelvis unremarkable  History of asthma continue butyryl as needed   Other plan as per orders.  DVT prophylaxis:  SCD   Code Status:    DNR/DNI  as per family  I had personally discussed CODE STATUS with   Family   Family Communication:   Family  at  Bedside  plan of care was discussed with   Daughter,    Disposition Plan:       To home once workup is complete and patient is stable                    Would benefit from PT/OT eval prior to DC  Ordered                    Swallow eval - SLP ordered                                        Consults called: Neurology     Admission status:  ED Disposition    ED Disposition Condition [place]: [place] [[id number]]  Level of Care: Telemetry Medical [[id number]]  I expect the patient will be discharged within 24 hours: No (not a candidate for 5C-Observation unit)  Covid Evaluation: Asymptomatic Screening Protocol (No Symptoms)  Diagnosis: TIA (transient ischemic attackTT:[id number]  Admitting Physician: Toy Baker [[id number]]  Attending Physician: Toy Baker [[id number]]       Obs     Level of care   Tele indefinitely please discontinue once patient  no longer qualifies   Precautions: admitted as asymptomatic screening protocol  No active isolations    PPE: Used by the provider:   P100  eye Goggles,  Gloves    Lashan Gluth 05/22/2019, 10:00 PM    [facility]     after 2 AM please page floor coverage PA If 7AM-7PM, please contact the day team taking care of the patient using [email or web address]

## 2019-05-22 NOTE — Code Documentation (Signed)
Responded to Code Stroke called on pt already in the ED. Pt arrived at 1937 with confusion/aphasia, [id number]-[id number]. Pt laid down for nap at 1700 and was at baseline. Pt woke up at 1800 with confusion and aphasia. Code stroke called at 1943 in ED. NIH-5, CBG-161, CT head negative for acute changes. TPA not given-pt on eliquis. CTA pending. Plan to admit to medical team.

## 2019-05-22 NOTE — Consult Note (Addendum)
Neurology Consultation Reason for Consult: AMS Referring Physician: Roslynn Amble, R  CC: Aphasia  History is obtained from:patient, granddaughter  HPI: Rebecca Skinner is a 85 y.o. female with a history of DM, stroke, afib, memory loss who was in her normal state of health earlier today. She then had confusion on awakening and therefore EMS was called. Due to sudden change in mental status, she was activated as a code stroke.   Sometimes she mistakes people for others, and talks about events of 20 years ago as if they are current, but others times seems "sharp."  She does not clean or cook for herself.  She is, however, able to have a conversation. she walks with a push walker.  LKW: 5pm   tpa given?: no, anticoagulated Premorbid modified rankin scale: 3   ROS:  Unable to obtain due to altered mental status.   Past Medical History:  Diagnosis Date  . Anxiety 11/20/2011  . Asthma    UNDER THE CARE [facility] DR PAZ  . CAD (coronary artery disease)    s/p CABG s/p AO valve replacement ([place] [facility])  . CVA (cerebral infarction) 08-2013   multiple, L, d/t Afib, started eloquis  . Diabetes mellitus    type 2   . Dizziness    Chronic, admiet 07-2010,saw neuro, thought to be a peripheral issue   . Dry skin   . GERD (gastroesophageal reflux disease)   . Hyperlipidemia   . Hypertension   . Hyperthyroidism   . Keloid    @ chest  . Memory loss   . Osteoarthritis   . Osteopenia    per dexa 12/09  . Paroxysmal atrial fibrillation ([place])    cards d/c coumadin 12/2008 d/t persisten NSR and frequent falls- restarted coumadin july 2011, now on Eliquis  . Recurrent UTI   . Shortness of breath dyspnea   . TIA (transient ischemic attack)      Family History  Problem Relation Age of Onset  . Heart attack Father   . Coronary artery disease Son 25       deceased  . Hypertension Brother   . Stroke Brother   . Colon cancer Neg Hx   . Breast cancer Neg Hx   . Thyroid disease Neg Hx      Social  History:  reports that she has quit smoking. She has never used smokeless tobacco. She reports that she does not drink alcohol or use drugs.   Exam: Current vital signs: There were no vitals filed for this visit. Vital signs in last 24 hours:    Physical Exam  Constitutional: Appears well-developed and well-nourished.  Psych: Affect appropriate to situation Eyes: No scleral injection HENT: No OP obstrucion MSK: no joint deformities.  Cardiovascular: Normal rate and regular rhythm.  Respiratory: Effort normal, non-labored breathing GI: Soft.  No distension. There is no tenderness.  Skin: WDI  Neuro: Mental Status: Patient is awake, alert, gives month as August, knows her correct age.  She does make some word substitution errors, and has hesitant speech, but is able to answer questions and follow commands reliably. Cranial Nerves: II: Right hemianopia.  Pupils are equal, round, and reactive to light.   III,IV, VI: EOMI without ptosis or diploplia.  V: Facial sensation is diminished on the right VII: Facial movement is symmetric.  VIII: hearing is intact to voice X: Uvula elevates symmetrically XI: Shoulder shrug is symmetric. XII: tongue is midline without atrophy or fasciculations.  Motor: Tone is normal. Bulk  is normal. 5/5 strength was present in all four extremities.  Sensory: She has diminished sensation on the right Cerebellar: Though she does not cooperate fully, she does not have any clear ataxia.   I have reviewed labs in epic and the results pertinent to this consultation are: Creatinine 0.95 Glucose 167  I have reviewed the images obtained: CT head-multiple old strokes  Impression: 85 year old female who I suspect has had a new stroke with subsequent numbness and aphasia, is in the setting of severe M1 stenosis.  With an MR S of 3, she is not a candidate for any type of interventional therapy, but I do suspect that she has had a new stroke.  She will need to be  admitted for secondary risk factor modification and therapy.  Although I think that stroke is most likely, with the degree of previous strokes that she has had, it is possible that this represents recrudescence-MRI is negative then I would search for physiological stressors.  Recommendations: - HgbA1c, fasting lipid panel - MRI of the brain without contrast - Frequent neuro checks - Echocardiogram - CT angio head and neck - Prophylactic therapy-Antiplatelet med: Aspirin - dose 325mg  PO or 300mg  PR, I would hold Eliquis until we know the size of the stroke. - Risk factor modification - Telemetry monitoring - PT consult, OT consult, Speech consult - Stroke team to follow   Roland Rack, MD [facility] [phone]  If 7pm- 7am, please page neurology on call as listed in [place].

## 2019-05-22 NOTE — ED Provider Notes (Signed)
[facility] EMERGENCY DEPARTMENT Provider Note   CSN: [id number] Arrival date & time: 05/22/19  1934     History No chief complaint on file.   Rebecca Skinner is a 85 y.o. female.  Past medical history diabetes, stroke, A. fib, on anticoagulation, presents to ER with acute onset of confusion.  Per EMS report, patient plan of chest, abdominal pain and had sudden onset of confusion around 6 PM.  For further history obtained from neurology, patient was actually napping and woke up at 6:00.  Last known well was ~5 PM.  History limited due to patient's altered mental status, acuity of situation.  HPI     Past Medical History:  Diagnosis Date  . Anxiety 11/20/2011  . Asthma    UNDER THE CARE [facility] DR PAZ  . CAD (coronary artery disease)    s/p CABG s/p AO valve replacement ([place] [facility])  . CVA (cerebral infarction) 08-2013   multiple, L, d/t Afib, started eloquis  . Diabetes mellitus    type 2   . Dizziness    Chronic, admiet 07-2010,saw neuro, thought to be a peripheral issue   . Dry skin   . GERD (gastroesophageal reflux disease)   . Hyperlipidemia   . Hypertension   . Hyperthyroidism   . Keloid    @ chest  . Memory loss   . Osteoarthritis   . Osteopenia    per dexa 12/09  . Paroxysmal atrial fibrillation ([place])    cards d/c coumadin 12/2008 d/t persisten NSR and frequent falls- restarted coumadin july 2011, now on Eliquis  . Recurrent UTI   . Shortness of breath dyspnea   . TIA (transient ischemic attack)     Patient Active Problem List   Diagnosis Date Noted  . Chest pain 06/21/2017  . TIA (transient ischemic attack) 08/30/2016  . Acute kidney injury ([place]) 03/05/2016  . Exposure keratitis 09/23/2015  . Keratopathy, band 09/13/2015  . PCP NOTES >>>>> 04/05/2015  . Chronic diastolic heart failure ([place]) 12/29/2014  . Edema 12/20/2014  . Dyspnea 12/20/2014  . Angiectasia 03/30/2014  . GI bleed 03/28/2014  . Dementia ([place]) 09/16/2013  .  Annual physical exam 11/21/2011  . ATRIAL FIBRILLATION, chronic 03/05/2007  . Diabetes mellitus with circulatory complication ([place]) [id number]  . S/P AVR (aortic valve replacement) 10/15/2006  . Hyperthyroidism 09/03/2006  . Dyslipidemia 09/03/2006  . HTN (hypertension) 09/03/2006  . CAD (coronary artery disease) of artery bypass graft 09/03/2006  . Asthma, chronic 09/03/2006    Past Surgical History:  Procedure Laterality Date  . ABDOMINAL HYSTERECTOMY    . AORTIC VALVE REPLACEMENT  1998  . CARDIAC VALVE REPLACEMENT    . CAROTID DOPPLER  07/13/12   BILATERAL BULB/PROXIMAL ICAS;MILD AMOUNT OF FIBROUS PLAQUE WITH NO DIAMETER REDUCTION.  . CESAREAN SECTION     x 2  . COLONOSCOPY WITH PROPOFOL N/A 03/30/2014   Procedure: COLONOSCOPY WITH PROPOFOL;  Surgeon: Irene Shipper, MD;  Location: [facility];  Service: Endoscopy;  Laterality: N/A;  . CORONARY ARTERY BYPASS GRAFT  1998   SVG TO RCA  . HEMORRHOID SURGERY    . JOINT REPLACEMENT    . MYOCARDIAL PERFUSION STUDY  12/19/09   NORMAL PATTERN OF PERFUSION IN ALL REGIONS.EF 75%.  . OOPHORECTOMY    . TOTAL KNEE ARTHROPLASTY  1999  . TRANSTHORACIC ECHOCARDIOGRAM  09/11/11   LVEF >55%.STAGE 1 DIASTOLIC DYSFUNCTION,ELEVATED LV FILLING PRESSURE.BIOPROSTHETIC AORTIC VALVE-PEAK AND MEAN GRADIENTS OF 17 MMHG AND 8 MMHG.SIGMOID SEPTUM.MILD TO  MOD MR.MILD TO MOD TR.RVSP 46 MMHG.     OB History    Gravida  4   Para  4   Term      Preterm      AB      Living        SAB      TAB      Ectopic      Multiple      Live Births              Family History  Problem Relation Age of Onset  . Heart attack Father   . Coronary artery disease Son 74       deceased  . Hypertension Brother   . Stroke Brother   . Colon cancer Neg Hx   . Breast cancer Neg Hx   . Thyroid disease Neg Hx     Social History   Tobacco Use  . Smoking status: Former Research scientist (life sciences)  . Smokeless tobacco: Never Used  . Tobacco comment: quit 2004, smoked 1.5 ppd    Substance Use Topics  . Alcohol use: No    Alcohol/week: 0.0 standard drinks  . Drug use: No    Home Medications Prior to Admission medications   Medication Sig Start Date End Date Taking? Authorizing Provider  acetaminophen (TYLENOL) 500 MG tablet Take 1,000 mg by mouth every 6 (six) hours as needed (for pain).     [provider]  albuterol (ACCUNEB) 0.63 MG/3ML nebulizer solution Take 3 mLs by nebulization every 6 (six) hours as needed for wheezing or shortness of breath. 04/21/18   Colon Branch, MD  ALPRAZolam Duanne Moron) 0.25 MG tablet TAKE 1/2 TABLET OR TAKE 1 TABLET BY MOUTH AT NIGHT AS NEEDED FOR INSOMINIA 11/11/18   Colon Branch, MD  amLODipine (NORVASC) 10 MG tablet Take 1 tablet (10 mg total) by mouth daily. 12/08/18   Colon Branch, MD  apixaban (ELIQUIS) 5 MG TABS tablet Take 1 tablet (5 mg total) by mouth 2 (two) times daily. 10/19/18   Colon Branch, MD  atorvastatin (LIPITOR) 40 MG tablet Take 1 tablet (40 mg total) by mouth at bedtime. 07/03/18   Colon Branch, MD  benazepril (LOTENSIN) 40 MG tablet TAKE 1 TABLET BY MOUTH EVERY DAY 11/12/18   Colon Branch, MD  budesonide (PULMICORT) 0.5 MG/2ML nebulizer solution Take 2 mLs (0.5 mg total) by nebulization daily. 03/26/19   Colon Branch, MD  Calcium Carbonate-Vitamin D (CALCIUM 500 + D) 500-125 MG-UNIT TABS Take 1 tablet by mouth at bedtime.    [provider]  esomeprazole (NEXIUM) 40 MG capsule Take 1 capsule (40 mg total) by mouth daily as needed. Patient taking differently: Take 40 mg by mouth daily as needed (for reflux symptoms).  01/19/18   Colon Branch, MD  furosemide (LASIX) 20 MG tablet Take 2 tablets (40 mg total) by mouth daily. 01/27/19   Colon Branch, MD  isosorbide mononitrate (IMDUR) 30 MG 24 hr tablet TAKE 1 TABLET BY MOUTH EVERY DAY 12/21/18   Troy Sine, MD  magnesium oxide (MAG-OX) 400 MG tablet Take 1 tablet (400 mg total) by mouth daily. 03/15/19   Colon Branch, MD  methimazole (TAPAZOLE) 5 MG tablet TAKE 1  TABLET BY MOUTH 3 TIMES A WEEK 09/23/18   Colon Branch, MD  metoprolol tartrate (LOPRESSOR) 50 MG tablet Take 1 tablet (50 mg total) by mouth 2 (two) times daily. 01/11/19   Kathlene November  E, MD  nitroGLYCERIN (NITROSTAT) 0.4 MG SL tablet Place 1 tablet (0.4 mg total) under the tongue every 5 (five) minutes x 3 doses as needed for chest pain. 12/13/16   Colon Branch, MD  potassium chloride (KLOR-CON M10) 10 MEQ tablet Take 1 tablet (10 mEq total) by mouth daily. 01/11/19   Colon Branch, MD  predniSONE (DELTASONE) 20 MG tablet Take 1 tablet (20 mg total) by mouth daily with breakfast. 05/21/19   Colon Branch, MD  Calcium Carbonate (CALCIUM 500 PO) Take 500 mg by mouth at bedtime. Complex vitamin  04/08/19  [provider]    Allergies    Hydrocodone and Tramadol hcl  Review of Systems   Review of Systems  Unable to perform ROS: Acuity of condition    Physical Exam Updated Vital Signs There were no vitals taken for this visit.  Physical Exam Constitutional:      Comments: Lying in bed, somewhat confused, no distress  Pulmonary:     Comments: Breath sounds clear to auscultation bilaterally, there is some generalized tenderness palpation over her chest, no crepitus Abdominal:     General: Abdomen is flat.     Palpations: Abdomen is soft.     Comments: Generalized tenderness to palpation of her abdomen, no rebound or guarding, not rigid  Musculoskeletal:        General: No swelling or tenderness.  Neurological:     GCS: GCS eye subscore is 3. GCS verbal subscore is 4. GCS motor subscore is 6.     Comments: Right hemianopsia, cranial nerves otherwise intact Question some decrease sensation to light touch over right lower extremity, 5 out of 5 strength in bilateral upper and lower extremities     ED Results / Procedures / Treatments   Labs (all labs ordered are listed, but only abnormal results are displayed) Labs Reviewed  I-STAT CHEM 8, ED - Abnormal; Notable for the following  components:      Result Value   Sodium 146 (*)    Potassium 3.4 (*)    Glucose, Bld 161 (*)    Calcium, Ion 1.10 (*)    All other components within normal limits  PROTIME-INR  APTT  CBC  DIFFERENTIAL  COMPREHENSIVE METABOLIC PANEL  CBG MONITORING, ED    EKG None  Radiology CT HEAD CODE STROKE WO CONTRAST  Result Date: 05/22/2019 CLINICAL DATA:  Code stroke.  Focal neuro deficit.  Possible stroke EXAM: CT HEAD WITHOUT CONTRAST TECHNIQUE: Contiguous axial images were obtained from the base of the skull through the vertex without intravenous contrast. COMPARISON:  CT head 10/23/2016 FINDINGS: Brain: Chronic infarcts in the right frontal and right parietal lobe unchanged. Chronic infarct in the left frontal lobe also unchanged. This extends down to the insula. Chronic microvascular ischemic changes in the white matter unchanged. Negative for acute infarct, hemorrhage, mass. No midline shift. Generalized atrophy. Vascular: Negative for hyperdense vessel. Skull: Negative Sinuses/Orbits: Paranasal sinuses clear. Cataract surgery on the right. No orbital mass. Other: None ASPECTS ([place] Stroke Program Early CT Score) - Ganglionic level infarction (caudate, lentiform nuclei, internal capsule, insula, M1-M3 cortex): 7 - Supraganglionic infarction (M4-M6 cortex): 3 Total score (0-10 with 10 being normal): 10 IMPRESSION: 1. No acute abnormality. 2. ASPECTS is 10 3. Extensive chronic ischemic changes stable from 2018 4. These results were called by telephone at the time of interpretation on 05/22/2019 at 7:53 pm to provider Leonel Ramsay MD, who verbally acknowledged these results. Electronically Signed   By: Juanda Crumble  Carlis Abbott M.D.   On: 05/22/2019 19:54    Procedures Procedures (including critical care time)  Medications Ordered in ED Medications  sodium chloride flush (NS) 0.9 % injection 3 mL (has no administration in time range)  iohexol (OMNIPAQUE) 350 MG/ML injection 125 mL (125 mLs Intravenous  Contrast Given 05/22/19 1956)  iohexol (OMNIPAQUE) 350 MG/ML injection 100 mL (100 mLs Intravenous Contrast Given 05/22/19 2002)    ED Course  I have reviewed the triage vital signs and the nursing notes.  Pertinent labs & imaging results that were available during my care of the patient were reviewed by me and considered in my medical decision making (see chart for details).  Clinical Course as of May 21 2326  Sat May 22, 2019  1935 Evaluated patient on EMS arrival, reported sudden onset of confusion, will call code stroke, EMS reports last known normal 6 PM   [RD]  1945 Neurology evaluated patient in [place], he discussed with family   [RD]  2133 Discussed with hospitalist   [RD]    Clinical Course User Index [RD] Lucrezia Starch, MD   MDM Rules/Calculators/A&P                         85 year old lady presented to the ER with acute onset of confusion.  On exam patient noted to be mildly confused, some aphasia.  Called stroke alert.  CT head, CTA negative.  Neurology had high clinical suspicion for stroke.  MRI ordered. Patient already on anticoagulation.  Recommended obtaining MRI and admitted to hospitalist for further management.  Family had reported some chest pain and abdominal pain, CTs were ordered which were without any acute findings.  Agree symptoms today most likely related to stroke.  Dr. Roel Cluck will accept to hospitalist service.    Final Clinical Impression(s) / ED Diagnoses Final diagnoses:  Acute ischemic stroke [facility])  Aphasia    Rx / DC Orders ED Discharge Orders    None       Lucrezia Starch, MD 05/22/19 2334

## 2019-05-23 ENCOUNTER — Observation Stay (HOSPITAL_COMMUNITY): Payer: Medicare Other

## 2019-05-23 DIAGNOSIS — R2990 Unspecified symptoms and signs involving the nervous system: Secondary | ICD-10-CM | POA: Diagnosis present

## 2019-05-23 DIAGNOSIS — R4701 Aphasia: Secondary | ICD-10-CM

## 2019-05-23 DIAGNOSIS — G459 Transient cerebral ischemic attack, unspecified: Secondary | ICD-10-CM

## 2019-05-23 LAB — URINALYSIS, ROUTINE W REFLEX MICROSCOPIC
Bacteria, UA: NONE SEEN
Bilirubin Urine: NEGATIVE
Glucose, UA: NEGATIVE mg/dL
Hgb urine dipstick: NEGATIVE
Ketones, ur: NEGATIVE mg/dL
Nitrite: NEGATIVE
Protein, ur: 30 mg/dL — AB
Specific Gravity, Urine: >1.046 — ABNORMAL HIGH (ref 1.005–1.030)
pH: 6 (ref 5.0–8.0)

## 2019-05-23 LAB — LIPID PANEL
Cholesterol: 97 mg/dL (ref 0–200)
HDL: 43 mg/dL (ref >40)
LDL Cholesterol: 49 mg/dL (ref 0–99)
Total CHOL/HDL Ratio: 2.3 RATIO
Triglycerides: 27 mg/dL (ref <150)
VLDL: 5 mg/dL (ref 0–40)

## 2019-05-23 LAB — HEMOGLOBIN A1C
Hgb A1c MFr Bld: 7.2 % — ABNORMAL HIGH (ref 4.8–5.6)
Mean Plasma Glucose: 159.94 mg/dL

## 2019-05-23 LAB — CBG MONITORING, ED
Glucose-Capillary: 175 mg/dL — ABNORMAL HIGH (ref 70–99)
Glucose-Capillary: 111 mg/dL — ABNORMAL HIGH (ref 70–99)
Glucose-Capillary: 112 mg/dL — ABNORMAL HIGH (ref 70–99)

## 2019-05-23 LAB — SARS CORONAVIRUS 2 (TAT 6-24 HRS): SARS Coronavirus 2: NEGATIVE

## 2019-05-23 LAB — BRAIN NATRIURETIC PEPTIDE: B Natriuretic Peptide: 698.3 pg/mL — ABNORMAL HIGH (ref 0.0–100.0)

## 2019-05-23 MED ORDER — SENNOSIDES-DOCUSATE SODIUM 8.6-50 MG PO TABS: 1.0000 | ORAL_TABLET | Freq: Every evening | ORAL | Status: DC | PRN

## 2019-05-23 MED ORDER — PANTOPRAZOLE SODIUM 40 MG PO TBEC
40.0000 mg | DELAYED_RELEASE_TABLET | Freq: Every day | ORAL | Status: DC
  Administered 2019-05-23: 40 mg via ORAL
  Filled 2019-05-23: qty 1

## 2019-05-23 MED ORDER — ASPIRIN 325 MG PO TABS: 325.0000 mg | ORAL_TABLET | Freq: Every day | ORAL | Status: DC

## 2019-05-23 MED ORDER — ALPRAZOLAM 0.25 MG PO TABS: 0.2500 mg | ORAL_TABLET | Freq: Every evening | ORAL | Status: DC | PRN

## 2019-05-23 MED ORDER — BENAZEPRIL HCL 40 MG PO TABS
40.0000 mg | ORAL_TABLET | Freq: Every day | ORAL | Status: DC
  Administered 2019-05-23: 40 mg via ORAL
  Filled 2019-05-23: qty 1

## 2019-05-23 MED ORDER — INSULIN ASPART 100 UNIT/ML ~~LOC~~ SOLN: 0.0000 [IU] | Freq: Every day | SUBCUTANEOUS | Status: DC

## 2019-05-23 MED ORDER — ACETAMINOPHEN 650 MG RE SUPP: 650.0000 mg | RECTAL | Status: DC | PRN

## 2019-05-23 MED ORDER — ISOSORBIDE MONONITRATE ER 30 MG PO TB24
30.0000 mg | ORAL_TABLET | Freq: Every day | ORAL | Status: DC
  Administered 2019-05-23: 13:00:00 30 mg via ORAL
  Filled 2019-05-23: qty 1

## 2019-05-23 MED ORDER — ACETAMINOPHEN 325 MG PO TABS: 650.0000 mg | ORAL_TABLET | ORAL | Status: DC | PRN

## 2019-05-23 MED ORDER — ATORVASTATIN CALCIUM 40 MG PO TABS
40.0000 mg | ORAL_TABLET | Freq: Every day | ORAL | Status: DC
  Administered 2019-05-23: 40 mg via ORAL
  Filled 2019-05-23: qty 1

## 2019-05-23 MED ORDER — ACETAMINOPHEN 160 MG/5ML PO SOLN: 650.0000 mg | ORAL | Status: DC | PRN

## 2019-05-23 MED ORDER — INSULIN ASPART 100 UNIT/ML ~~LOC~~ SOLN: 0.0000 [IU] | Freq: Three times a day (TID) | SUBCUTANEOUS | Status: DC

## 2019-05-23 MED ORDER — ASPIRIN 300 MG RE SUPP: 300.0000 mg | Freq: Every day | RECTAL | Status: DC

## 2019-05-23 MED ORDER — STROKE: EARLY STAGES OF RECOVERY BOOK: Freq: Once | Status: DC

## 2019-05-23 MED ORDER — APIXABAN 5 MG PO TABS
5.0000 mg | ORAL_TABLET | Freq: Two times a day (BID) | ORAL | Status: DC
  Administered 2019-05-23: 5 mg via ORAL
  Filled 2019-05-23 (×2): qty 1

## 2019-05-23 MED ORDER — METHIMAZOLE 5 MG PO TABS: 5.0000 mg | ORAL_TABLET | ORAL | Status: DC

## 2019-05-23 NOTE — Progress Notes (Signed)
EEG complete - results pending 

## 2019-05-23 NOTE — Progress Notes (Signed)
STROKE TEAM PROGRESS NOTE   INTERVAL HISTORY Her EEG tech and Dr. Broadus John is at the bedside.  Pt awake alert, not orientated to place and time but orientated to self and age. She still complain of RUQ abdominal pain but pan CT no significant finding. She denies N/V or diarrhea.    OBJECTIVE Vitals:   05/23/19 0900 05/23/19 0915 05/23/19 0930 05/23/19 0945  BP: (!) 148/78 (!) 153/71 (!) 182/102 (!) 166/95  Pulse: 68 87 79 72  Resp: 16     Temp:      TempSrc:      SpO2: 96% 96% 95% 97%    CBC:  Recent Labs  Lab 05/22/19 1951 05/22/19 2007  WBC  --  9.3  NEUTROABS  --  5.7  HGB 12.6 11.6*  HCT 37.0 36.3  MCV  --  100.0  PLT  --  146*    Basic Metabolic Panel:  Recent Labs  Lab 05/22/19 1951 05/22/19 2007 05/22/19 2154  NA 146* 146*  --   K 3.4* 3.4*  --   CL 105 104  --   CO2  --  29  --   GLUCOSE 161* 140*  --   BUN 21 19  --   CREATININE 0.90 1.00  --   CALCIUM  --  9.2  --   MG  --   --  2.0    Lipid Panel:     Component Value Date/Time   CHOL 97 05/23/2019 0514   TRIG 27 05/23/2019 0514   HDL 43 05/23/2019 0514   CHOLHDL 2.3 05/23/2019 0514   VLDL 5 05/23/2019 0514   LDLCALC 49 05/23/2019 0514   HgbA1c:  Lab Results  Component Value Date   HGBA1C 7.2 (H) 05/23/2019   Urine Drug Screen:     Component Value Date/Time   LABOPIA NONE DETECTED 03/05/2016 1557   COCAINSCRNUR NONE DETECTED 03/05/2016 1557   LABBENZ NONE DETECTED 03/05/2016 1557   AMPHETMU NONE DETECTED 03/05/2016 1557   THCU NONE DETECTED 03/05/2016 1557   LABBARB NONE DETECTED 03/05/2016 1557    Alcohol Level     Component Value Date/Time   ETH <5 03/05/2016 1858    IMAGING  CT Code Stroke CTA Neck W/WO contrast CT Code Stroke CTA Head W/WO contrast 05/22/2019 IMPRESSION:  1. 50% diameter stenosis proximal right internal carotid artery.  2. 50% diameter stenosis proximal left internal carotid artery.  3. Right vertebral artery dominant and widely patent. Small left  vertebral artery with origin from the aortic arch. Hypoplastic distal left vertebral artery.  4. Moderate stenosis right cavernous carotid and mild stenosis left cavernous carotid.  5. No significant intracranial stenosis or large vessel occlusion.   CT Chest W Contrast 05/22/2019 IMPRESSION:  1. Trace right pleural effusion.  2. Mild superior endplate compression deformity of the L1 vertebral body, new from prior, although age indeterminate.  3. Cardiomegaly.  4. Colonic diverticulosis without evidence of acute diverticulitis.  5. Multiple bilateral renal cystic lesions, some of which likely contain proteinaceous or hemorrhagic contents. A solid renal mass would be difficult to exclude.  6. Enlarged, heterogeneous thyroid gland.  7. Aortic atherosclerosis. Aortic Atherosclerosis (ICD10-I70.0).   MR BRAIN WO CONTRAST 05/22/2019 IMPRESSION:  1. Truncated examination due to patient altered mental status and inability to cooperate with technologist's instructions.  2. No acute ischemia. No other acute abnormality visible on diffusion-weighted imaging.   CT ABDOMEN PELVIS W CONTRAST 05/22/2019 IMPRESSION:  1. Trace right pleural effusion.  2.  Mild superior endplate compression deformity of the L1 vertebral body, new from prior, although age indeterminate.  3. Cardiomegaly.  4. Colonic diverticulosis without evidence of acute diverticulitis.  5. Multiple bilateral renal cystic lesions, some of which likely contain proteinaceous or hemorrhagic contents. A solid renal mass would be difficult to exclude.  6. Enlarged, heterogeneous thyroid gland.  7. Aortic atherosclerosis. Aortic Atherosclerosis (ICD10-I70.0).   CT HEAD CODE STROKE WO CONTRAST 05/22/2019 IMPRESSION:  1. No acute abnormality.  2. ASPECTS is 10  3. Extensive chronic ischemic changes stable from 2018    Transthoracic Echocardiogram  00/00/2020 Pending   ECG - atrial fibrillation - ventricular response 92 BPM (See  cardiology reading for complete details)   PHYSICAL EXAM  Temp:  [98.2 F (36.8 C)] 98.2 F (36.8 C) (12/12 2024) Pulse Rate:  [68-91] 72 (12/13 0945) Resp:  [16-21] 16 (12/13 0900) BP: (139-182)/(70-102) 166/95 (12/13 0945) SpO2:  [95 %-99 %] 97 % (12/13 0945)  General - Well nourished, well developed, mild distress due to subjective RUQ abdominal pain.  Ophthalmologic - fundi not visualized due to noncooperation.  Cardiovascular - irregularly irregular heart rate and rhythm.  Abdomen - mild to moderate tenderness on touch at RUQ, but also mild tenderness to touch at LUQ. No rigidity or rebound tenderness  Neuro - awake alert, orientated to self age, but not to place or time. Following commands, no aphasia, able to name and repeat, but mild to moderate dysarthria. PERRL, EOMI, visual field full, tracking both sides, tends to tracking more on the left but with prompt she was able to track on the right without difficulty. Facial symmetry, tongue midline. Moving all extremities symmetrically. FTN bilaterally intact. Sensation symmetrical. Gait not tested.    ASSESSMENT/PLAN Ms. SHEREENA STROBLE is a 85 y.o. female with history of DM, CAD, Hld, Htn, Hyperthyroidism, stroke, afib (Eliquis), memory loss presenting with AMS and confusion on awakening. She did not receive IV t-PA due to Eliquis  Cognitive impairment on awakening vs. TIA    Code Stroke CT Head - No acute abnormality. ASPECTS is 10. Extensive chronic ischemic changes stable from 2018      MRI head - no acute ischemia  CTA H&N - no LVO. B/l ICA bulb and siphon atherosclerosis  2D Echo 01/2019 - EF 60-65%  Lacey Jensen Virus 2 - negative  LDL - 49  HgbA1c - 7.2  VTE prophylaxis - Eliquis  Eliquis (apixaban) daily prior to admission, now on Eliquis (apixaban) daily. Continue eliquis on discharge  Ongoing aggressive stroke risk factor management  Therapy recommendations:  pending  Disposition:  Pending  Chronic A.  Fib  Pt was on coumadin but d/c due to multiple falls in 2009 and 2010  She was later on eliquis  She had GIB with anemia requiring PRBC in 2015, her ASA and eliquis stopped. 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 ASA resumed but not eliquis.  TIA in 08/2016, Eliquis restarted.  Continue Eliquis on discharge  GI bleeding  2015 GI bleeding, work-up showed AVM at the cecum area.  Eliquis discontinued at that time  No more GI bleeding since, Eliquis restarted in 2018  CT abdomen pelvis unremarkable, except colonic diverticulosis without evidence of acute diverticulitis  Continue Eliquis on discharge  History of TIA  08/2016 admitted for transient altered mental status, aphasia.  MRI negative.  MRA showed left A2 severe stenosis.  Carotid Doppler negative.  EF 65 to 70%.  LDL 56.  Etiology was due to  A. fib not on AC.  She was put on Eliquis and Lipitor on discharge.  Hypertension  Home BP meds: Lotensin ; Norvasc ; metoprolol  Current BP meds: none   Stable . Permissive hypertension (OK if < 220/120) but gradually normalize in 5-7 days  . Long-term BP goal normotensive  Hyperlipidemia  Home Lipid lowering medication: Lipitor 40 mg daily  LDL 49, goal < 70  Current lipid lowering medication: Lipitor 40 mg daily   Continue statin at discharge  Diabetes  Home diabetic meds: none   Current diabetic meds: SSI   HgbA1c 7.2, goal < 7.0  CBG monitoring  Close PCP follow-up  Other Stroke Risk Factors  Advanced age  Former cigarette smoker - quit  Family hx stroke (brother)  Coronary artery disease status post CABG and AV placement  Other Active Problems  Mild hypokalemia - on potassium  Baseline memory loss  Hospital day # 0  Neurology will sign off. Please call with questions. Pt will follow up with stroke clinic NP at [facility] in about 4 weeks. Thanks for the consult.  Rosalin Hawking, MD PhD Stroke Neurology 05/23/2019 1:33 PM   To contact Stroke  Continuity provider, please refer to [email or web address] After hours, contact General Neurology

## 2019-05-23 NOTE — Procedures (Signed)
Patient Name: RAEANNA MIRABELLI  MRN: [id number]  Epilepsy Attending: Lora Havens  Referring Physician/Provider:  Dr Roland Rack Date: 05/23/2019  Duration: 25.39 mins  Patient history: 85yo F with speech disturbance and ams. EEG to evaluate for seizure  Level of alertness: awake  AEDs during EEG study: None  Technical aspects: This EEG study was done with scalp electrodes positioned according to the 10-20 International system of electrode placement. Electrical activity was acquired at a sampling rate of 500Hz  and reviewed with a high frequency filter of 70Hz  and a low frequency filter of 1Hz . EEG data were recorded continuously and digitally stored.   DESCRIPTION:  The posterior dominant rhythm consists of 8 Hz activity of moderate voltage (25-35 uV) seen predominantly in posterior head regions, symmetric and reactive to eye opening and eye closing. EEG also showed intermittent generalized 3-5hz  theta-delta slowing. Hyperventilation and photic stimulation were not performed.   ABNORMALITY - Intermittent slow, generalized   IMPRESSION: This study is suggestive of mild diffuse encephalopathy. No seizures or epileptiform discharges were seen throughout the recording.  If suspicion for interictal activity remains, consider prolonged study with sleep.    Josette Shimabukuro Barbra Sarks

## 2019-05-23 NOTE — ED Notes (Signed)
Patient verbalizes understanding of discharge instructions. Opportunity for questioning and answers were provided. Armband removed by staff, pt discharged from ED.  

## 2019-05-24 ENCOUNTER — Telehealth: Payer: Self-pay

## 2019-05-24 NOTE — Telephone Encounter (Signed)
Copied from [place] [id number]. Topic: General - Other >> May 24, 2019  8:05 AM Keene Breath wrote: Reason for CRM: Patient's daughter called to schedule a hosp. Follow up for patient, since she went to the ER for a mini stroke on Saturday.  They released her and said that she should follow up with her PCP.  Tried the office 2x to schedule, but no answer.  Please call [name] at [phone] to schedule appt. Asap.

## 2019-05-24 NOTE — Telephone Encounter (Signed)
Transition Care Management Follow-up Telephone Call--call done w/ daughter [name].   Date discharged? 05/23/19   How have you been since you were released from the hospital? Doing better   Do you understand why you were in the hospital? yes   Do you understand the discharge instructions? yes   Where were you discharged to? Home w daughter.   Items Reviewed:  Medications reviewed: daughter reports no changes  Allergies reviewed: yes  Dietary changes reviewed: yes  Referrals reviewed: yes   Functional Questionnaire:  Activities of Daily Living (ADLs):   She states they are independent in the following: ambulation, bathing and hygiene, continence, grooming, toileting and dressing States they require assistance with the following: bathing and hygiene . Uses walker  Any transportation issues/concerns?: no   Any patient concerns? no   Confirmed importance and date/time of follow-up visits scheduled yes  Provider Appointment booked with PCP 05/25/19  Confirmed with patient if condition begins to worsen call PCP or go to the ER.  Patient was given the office number and encouraged to call back with question or concerns.  : yes

## 2019-05-24 NOTE — Telephone Encounter (Signed)
Does she need TCM? 

## 2019-05-25 ENCOUNTER — Other Ambulatory Visit: Payer: Self-pay

## 2019-05-25 ENCOUNTER — Ambulatory Visit (INDEPENDENT_AMBULATORY_CARE_PROVIDER_SITE_OTHER): Payer: Medicare Other | Admitting: Internal Medicine

## 2019-05-25 ENCOUNTER — Other Ambulatory Visit: Payer: Self-pay | Admitting: Internal Medicine

## 2019-05-25 ENCOUNTER — Encounter: Payer: Self-pay | Admitting: Internal Medicine

## 2019-05-25 VITALS — BP 138/80 | HR 72 | Temp 96.5°F | Wt 176.1 lb

## 2019-05-25 DIAGNOSIS — E1159 Type 2 diabetes mellitus with other circulatory complications: Secondary | ICD-10-CM | POA: Diagnosis not present

## 2019-05-25 DIAGNOSIS — J45901 Unspecified asthma with (acute) exacerbation: Secondary | ICD-10-CM | POA: Diagnosis not present

## 2019-05-25 DIAGNOSIS — R0789 Other chest pain: Secondary | ICD-10-CM

## 2019-05-25 DIAGNOSIS — G459 Transient cerebral ischemic attack, unspecified: Secondary | ICD-10-CM

## 2019-05-25 DIAGNOSIS — I10 Essential (primary) hypertension: Secondary | ICD-10-CM | POA: Diagnosis not present

## 2019-05-25 DIAGNOSIS — F0390 Unspecified dementia without behavioral disturbance: Secondary | ICD-10-CM

## 2019-05-25 MED ORDER — NITROGLYCERIN 0.4 MG SL SUBL: 0.4000 mg | SUBLINGUAL_TABLET | SUBLINGUAL | 3 refills | Status: AC | PRN

## 2019-05-25 MED ORDER — ALBUTEROL SULFATE 0.63 MG/3ML IN NEBU: 3.0000 mL | INHALATION_SOLUTION | Freq: Four times a day (QID) | RESPIRATORY_TRACT | 5 refills | Status: AC | PRN

## 2019-05-25 MED ORDER — CLONIDINE HCL 0.1 MG PO TABS: 0.1000 mg | ORAL_TABLET | Freq: Three times a day (TID) | ORAL | 1 refills | Status: DC | PRN

## 2019-05-25 NOTE — Progress Notes (Signed)
Subjective:    Patient ID: Rebecca Skinner, female    DOB: 1923-04-26, 85 y.o.   MRN: [id number]  DOS:  05/25/2019  Type of visit - description: TCM  Went to the ER 05/22/2019 She was evaluated there because she woke up at 6 PM confused and aphasic. Code stroke called, no TPA due to patient being on Eliquis. Due to dementia, history was limited.  CT head: No acute changes, unable to complete MRI of the brain due to patient status (but appear nonacute), CTA head and neck:  1. 50% diameter stenosis proximal right internal carotid artery. 2. 50% diameter stenosis proximal left internal carotid artery. 3. Right vertebral artery dominant and widely patent. Small left vertebral artery with origin from the aortic arch. Hypoplastic distal left vertebral artery. 4. Moderate stenosis right cavernous carotid and mild stenosis left cavernous carotid. 5. No significant intracranial stenosis or large vessel occlusion.  Had a CT abdomen and pelvis d/t abd pain: Question of solid renal mass, see report below  1. Trace right pleural effusion. 2. Mild superior endplate compression deformity of the L1 vertebral body, new from prior, although age indeterminate. 3. Cardiomegaly. 4. Colonic diverticulosis without evidence of acute diverticulitis. 5. Multiple bilateral renal cystic lesions, some of which likely contain proteinaceous or hemorrhagic contents. A solid renal mass would be difficult to exclude. 6. Enlarged, heterogeneous thyroid gland. 7. Aortic atherosclerosis.  Evaluated by neurology: Final diagnosis was cognitive impairment on awakening versus TIA.   Review of Systems She is here with her daughter Since she left the hospital she is back to baseline.  Occasionally confused. No fever chills No chest pain or difficulty breathing Continue with cough and chest congestion, not a new issue, symptoms are on and off. She has not reported abdominal pain since she left the hospital.     Past Medical History:  Diagnosis Date  . Anxiety 11/20/2011  . Asthma    UNDER THE [facility]ARE [facility] DR Snigdha Howser  . CAD (coronary artery disease)    s/p CABG s/p AO valve replacement ([place] [facility])  . CVA (cerebral infarction) 08-2013   multiple, L, d/t Afib, started eloquis  . Diabetes mellitus    type 2   . Dizziness    Chronic, admiet 07-2010,saw neuro, thought to be a peripheral issue   . Dry skin   . GERD (gastroesophageal reflux disease)   . Hyperlipidemia   . Hypertension   . Hyperthyroidism   . Keloid    @ chest  . Memory loss   . Osteoarthritis   . Osteopenia    per dexa 12/09  . Paroxysmal atrial fibrillation ([place])    cards d/c coumadin 12/2008 d/t persisten NSR and frequent falls- restarted coumadin july 2011, now on Eliquis  . Recurrent UTI   . Shortness of breath dyspnea   . TIA (transient ischemic attack)     Past Surgical History:  Procedure Laterality Date  . ABDOMINAL HYSTERECTOMY    . AORTIC VALVE REPLACEMENT  1998  . CARDIAC VALVE REPLACEMENT    . CAROTID DOPPLER  07/13/12   BILATERAL BULB/PROXIMAL ICAS;MILD AMOUNT OF FIBROUS PLAQUE WITH NO DIAMETER REDUCTION.  . CESAREAN SECTION     x 2  . COLONOSCOPY WITH PROPOFOL N/A 03/30/2014   Procedure: COLONOSCOPY WITH PROPOFOL;  Surgeon: Irene Shipper, MD;  Location: [facility];  Service: Endoscopy;  Laterality: N/A;  . CORONARY ARTERY BYPASS GRAFT  1998   SVG TO RCA  . HEMORRHOID SURGERY    .  JOINT REPLACEMENT    . MYOCARDIAL PERFUSION STUDY  12/19/09   NORMAL PATTERN OF PERFUSION IN ALL REGIONS.EF 75%.  . OOPHORECTOMY    . TOTAL KNEE ARTHROPLASTY  1999  . TRANSTHORACIC ECHOCARDIOGRAM  09/11/11   LVEF >55%.STAGE 1 DIASTOLIC DYSFUNCTION,ELEVATED LV FILLING PRESSURE.BIOPROSTHETIC AORTIC VALVE-PEAK AND MEAN GRADIENTS OF 17 MMHG AND 8 MMHG.SIGMOID SEPTUM.MILD TO MOD MR.MILD TO MOD TR.RVSP 46 MMHG.    Social History   Socioeconomic History  . Marital status: Divorced    Spouse name: Not on file  . Number of children:  4  . Years of education: Not on file  . Highest education level: Not on file  Occupational History  . Occupation: retired     Fish farm manager: RETIRED  Tobacco Use  . Smoking status: Former Research scientist (life sciences)  . Smokeless tobacco: Never Used  . Tobacco comment: quit 2004, smoked 1.5 ppd  Substance and Sexual Activity  . Alcohol use: No    Alcohol/week: 0.0 standard drinks  . Drug use: No  . Sexual activity: Not Currently  Other Topics Concern  . Not on file  Social History Narrative   Had to moved w/ [name]  ~ 01-2016   Had 3 daughter- 2 son  (lost oldest daughter and son), 2 living daughters in [place] Determinants of Health   Financial Resource Strain:   . Difficulty of Paying Living Expenses: Not on file  Food Insecurity:   . Worried About Charity fundraiser in the Last Year: Not on file  . Ran Out of Food in the Last Year: Not on file  Transportation Needs:   . Lack of Transportation (Medical): Not on file  . Lack of Transportation (Non-Medical): Not on file  Physical Activity:   . Days of Exercise per Week: Not on file  . Minutes of Exercise per Session: Not on file  Stress:   . Feeling of Stress : Not on file  Social Connections:   . Frequency of Communication with Friends and Family: Not on file  . Frequency of Social Gatherings with Friends and Family: Not on file  . Attends Religious Services: Not on file  . Active Member of Clubs or Organizations: Not on file  . Attends Archivist Meetings: Not on file  . Marital Status: Not on file  Intimate Partner Violence:   . Fear of Current or Ex-Partner: Not on file  . Emotionally Abused: Not on file  . Physically Abused: Not on file  . Sexually Abused: Not on file      Allergies as of 05/25/2019      Reactions   Hydrocodone Itching, Nausea Only   Tramadol Hcl Itching, Nausea Only      Medication List       Accurate as of May 25, 2019  1:28 PM. If you have any questions, ask your nurse or doctor.         acetaminophen 500 MG tablet Commonly known as: TYLENOL Take 1,000 mg by mouth every 6 (six) hours as needed (for pain).   albuterol 0.63 MG/3ML nebulizer solution Commonly known as: ACCUNEB Take 3 mLs by nebulization every 6 (six) hours as needed for wheezing or shortness of breath. What changed: additional instructions   ALPRAZolam 0.25 MG tablet Commonly known as: XANAX TAKE 1/2 TABLET OR TAKE 1 TABLET BY MOUTH AT NIGHT AS NEEDED FOR INSOMINIA What changed: See the new instructions.   amLODipine 10 MG tablet Commonly known as: NORVASC Take 1 tablet (10 mg  total) by mouth daily.   apixaban 5 MG Tabs tablet Commonly known as: Eliquis Take 1 tablet (5 mg total) by mouth 2 (two) times daily.   atorvastatin 40 MG tablet Commonly known as: LIPITOR Take 1 tablet (40 mg total) by mouth at bedtime.   benazepril 40 MG tablet Commonly known as: LOTENSIN TAKE 1 TABLET BY MOUTH EVERY DAY What changed: when to take this   budesonide 0.5 MG/2ML nebulizer solution Commonly known as: PULMICORT Take 2 mLs (0.5 mg total) by nebulization daily.   Calcium 500 + D 500-125 MG-UNIT Tabs Generic drug: Calcium Carbonate-Vitamin D Take 1 tablet by mouth at bedtime.   esomeprazole 40 MG capsule Commonly known as: NEXIUM Take 1 capsule (40 mg total) by mouth daily as needed. What changed: reasons to take this   furosemide 20 MG tablet Commonly known as: LASIX Take 2 tablets (40 mg total) by mouth daily.   isosorbide mononitrate 30 MG 24 hr tablet Commonly known as: IMDUR TAKE 1 TABLET BY MOUTH EVERY DAY   magnesium oxide 400 MG tablet Commonly known as: MAG-OX Take 1 tablet (400 mg total) by mouth daily.   methimazole 5 MG tablet Commonly known as: TAPAZOLE TAKE 1 TABLET BY MOUTH 3 TIMES A WEEK What changed: See the new instructions.   metoprolol tartrate 50 MG tablet Commonly known as: LOPRESSOR Take 1 tablet (50 mg total) by mouth 2 (two) times daily.   nitroGLYCERIN  0.4 MG SL tablet Commonly known as: NITROSTAT Place 1 tablet (0.4 mg total) under the tongue every 5 (five) minutes x 3 doses as needed for chest pain.   potassium chloride 10 MEQ tablet Commonly known as: Klor-Con M10 Take 1 tablet (10 mEq total) by mouth daily.           Objective:   Physical Exam BP 138/80 (BP Location: Right Arm, Patient Position: Sitting, Cuff Size: Normal)   Pulse 72   Temp (!) 96.5 F (35.8 C) (Temporal)   Wt 176 lb 2 oz (79.9 kg)   SpO2 93%   BMI 31.20 kg/m  General:   Well developed, NAD, BMI noted. HEENT:  Normocephalic . Face symmetric, atraumatic Lungs:  Few rhonchi, very few wheezes. Normal respiratory effort, no intercostal retractions, no accessory muscle use. Heart: Irregular, + systolic murmur, mild No pretibial edema bilaterally  Skin: Not pale. Not jaundice Neurologic:  alert & pleasantly demented, follows simple commands. Speech normal, gait appropriate for age, uses a walker Psych--  Behavior appropriate. No anxious or depressed appearing.      Assessment     Assessment DM HTN Hyperlipidemia Hyperthyroidism -- used to see Dr. Loanne Drilling, last visit 01-2016, now f/u PCP, check labs x 2 q year GERD Asthma  Anxiety on xanax  CV: Dr. Claiborne Billings --CHF, CAD, CABG, Aortic valve replacement --P- atrial fibrillation, used to be on Coumadin, Eliquis rx after the stroke 3-15, d/c 03-2014 d/t GI bleed , on ASA 81. Dx TIA 08-2016, back on eliquis, asa d/c.   NEURO --TIAs (admited 08-2016), CVA 2015 --Dementia MMSE ~ 12 to 15  (12-2016) DJD Osteopenia 2009 Recurrent UTIs Daughter  Leotha Shorr; lives w/ Nash Dimmer Extensive chart review done today, see HPI TIA: Records reviewed, I do not see a discharge summary but the stroke team signed off and recommended medical management. DM: Diet controlled, last A1c 7.2 Hyperlipidemia: Well-controlled HTN: Currently on amlodipine, Lotensin, Lasix, Imdur, metoprolol, potassium.  SBP ranges from 132 170  , DBP of 70-110 at home.Marland Kitchen  Plan: Add clonidine 0.1 3 times daily as needed.  If good tolerance, this could be, permanent medication.  I asked the family to call me in 10 days with readings Dementia: Back to baseline Asthma: Continue Pulmicort and albuterol, using on average twice a day.  Refills sent Long-term goals: The patient is 89, pleasantly demented, discussed with the daughter that we could be more aggressive managing her DM and HTN but that will require more  medication and the risk of side effects or over control of BP.  We agreed to minimize interventions. Renal mass: Possible solid renal mass, see CT report.  Recommend no further work-up. RTC 1 month   This visit occurred during the SARS-CoV-2 public health emergency.  Safety protocols were in place, including screening questions prior to the visit, additional usage of staff PPE, and extensive cleaning of exam room while observing appropriate contact time as indicated for disinfecting solutions.

## 2019-05-25 NOTE — Assessment & Plan Note (Signed)
Extensive chart review done today, see HPI TIA: Records reviewed, I do not see a discharge summary but the stroke team signed off and recommended medical management. DM: Diet controlled, last A1c 7.2 Hyperlipidemia: Well-controlled HTN: Currently on amlodipine, Lotensin, Lasix, Imdur, metoprolol, potassium.  SBP ranges from 132 170 , DBP of 70-110 at home.. Plan: Add clonidine 0.1 3 times daily as needed.  If good tolerance, this could be, permanent medication.  I asked the family to call me in 10 days with readings Dementia: Back to baseline Asthma: Continue Pulmicort and albuterol, using on average twice a day.  Refills sent Long-term goals: The patient is 68, pleasantly demented, discussed with the daughter that we could be more aggressive managing her DM and HTN but that will require more  medication and the risk of side effects or over control of BP.  We agreed to minimize interventions. Renal mass: Possible solid renal mass, see CT report.  Recommend no further work-up. RTC 1 month

## 2019-05-25 NOTE — Patient Instructions (Addendum)
  GO TO THE FRONT DESK Schedule your next appointment   For a check up in 1 month   Continue checking BPs, once or twice a day BP GOAL is between 110/65 and  135/85.  If the BP is > than 150/90, give clonodine 0.1 mg , may repeat every 8 hours  Call with BP readings in 10 days

## 2019-05-29 ENCOUNTER — Other Ambulatory Visit: Payer: Self-pay | Admitting: Internal Medicine

## 2019-06-07 ENCOUNTER — Other Ambulatory Visit: Payer: Self-pay | Admitting: *Deleted

## 2019-06-07 NOTE — Patient Outreach (Signed)
[facility]) Care Management  06/07/2019  KAYLEANNA BOOTHBY March 26, 1923 [id number]   RN Health Coach Monthly Outreach  Referral Date:  12/03/2018 Referral Source:  [facility] High Risk Screening Reason for Referral:  Disease Management Education Insurance:  [employer]   Outreach Attempt:  Outreach attempt #5 to patient or daughter for follow up.  Daughter answered and stated she was driving and unable to speak at the time.   Plan:  RN Health Coach will make another outreach attempt within the month of January.  [place] [phone] [email or web address]

## 2019-06-16 ENCOUNTER — Other Ambulatory Visit: Payer: Self-pay | Admitting: Internal Medicine

## 2019-06-16 DIAGNOSIS — R0789 Other chest pain: Secondary | ICD-10-CM

## 2019-06-17 NOTE — Telephone Encounter (Signed)
Last OV 05/25/19 Last refill 05/25/19 #90/1 Next OV 06/23/19

## 2019-06-18 ENCOUNTER — Other Ambulatory Visit: Payer: Self-pay | Admitting: Internal Medicine

## 2019-06-18 NOTE — Telephone Encounter (Signed)
Last OV 05/25/19 Last refill 07/03/18 #90/3 Next OV 06/23/19

## 2019-06-23 ENCOUNTER — Ambulatory Visit (INDEPENDENT_AMBULATORY_CARE_PROVIDER_SITE_OTHER): Payer: Medicare Other | Admitting: Internal Medicine

## 2019-06-23 ENCOUNTER — Other Ambulatory Visit: Payer: Self-pay

## 2019-06-23 DIAGNOSIS — I10 Essential (primary) hypertension: Secondary | ICD-10-CM

## 2019-06-23 DIAGNOSIS — J4520 Mild intermittent asthma, uncomplicated: Secondary | ICD-10-CM | POA: Diagnosis not present

## 2019-06-23 DIAGNOSIS — F0390 Unspecified dementia without behavioral disturbance: Secondary | ICD-10-CM

## 2019-06-23 MED ORDER — PREDNISONE 10 MG PO TABS: ORAL_TABLET | ORAL | 0 refills | Status: DC

## 2019-06-23 NOTE — Progress Notes (Signed)
Subjective:    Patient ID: Rebecca Skinner, female    DOB: 11-26-1922, 86 y.o.   MRN: [id number]  DOS:  06/23/2019 Type of visit - description: Virtual Visit via Video Note  I connected with the above patient  by a video enabled telemedicine application and verified that I am speaking with the correct person using two identifiers.   THIS ENCOUNTER IS A VIRTUAL VISIT DUE TO COVID-19 - PATIENT WAS NOT SEEN IN THE OFFICE. PATIENT HAS CONSENTED TO VIRTUAL VISIT / TELEMEDICINE VISIT   Location of patient: home  Location of provider: office  I discussed the limitations of evaluation and management by telemedicine and the availability of in person appointments. The patient expressed understanding and agreed to proceed.   Follow-up Today I communicate via video with the patient and her daughter. Overall she is a stable. We talk about her blood pressures, and her readings are improving. TIA: No further symptoms Asthma: Has symptoms on and off DM: No ambulatory CBGs Back pain is issue, prednisone?     Past Medical History:  Diagnosis Date  . Anxiety 11/20/2011  . Asthma    UNDER THE CARE [facility] DR Shahzaib Azevedo  . CAD (coronary artery disease)    s/p CABG s/p AO valve replacement ([place] [facility])  . CVA (cerebral infarction) 08-2013   multiple, L, d/t Afib, started eloquis  . Diabetes mellitus    type 2   . Dizziness    Chronic, admiet 07-2010,saw neuro, thought to be a peripheral issue   . Dry skin   . GERD (gastroesophageal reflux disease)   . Hyperlipidemia   . Hypertension   . Hyperthyroidism   . Keloid    @ chest  . Memory loss   . Osteoarthritis   . Osteopenia    per dexa 12/09  . Paroxysmal atrial fibrillation ([place])    cards d/c coumadin 12/2008 d/t persisten NSR and frequent falls- restarted coumadin july 2011, now on Eliquis  . Recurrent UTI   . Shortness of breath dyspnea   . TIA (transient ischemic attack)     Past Surgical History:  Procedure Laterality Date  . ABDOMINAL  HYSTERECTOMY    . AORTIC VALVE REPLACEMENT  1998  . CARDIAC VALVE REPLACEMENT    . CAROTID DOPPLER  07/13/12   BILATERAL BULB/PROXIMAL ICAS;MILD AMOUNT OF FIBROUS PLAQUE WITH NO DIAMETER REDUCTION.  . CESAREAN SECTION     x 2  . COLONOSCOPY WITH PROPOFOL N/A 03/30/2014   Procedure: COLONOSCOPY WITH PROPOFOL;  Surgeon: Irene Shipper, MD;  [facility]: [facility];  Service: Endoscopy;  Laterality: N/A;  . CORONARY ARTERY BYPASS GRAFT  1998   SVG TO RCA  . HEMORRHOID SURGERY    . JOINT REPLACEMENT    . MYOCARDIAL PERFUSION STUDY  12/19/09   NORMAL PATTERN OF PERFUSION IN ALL REGIONS.EF 75%.  . OOPHORECTOMY    . TOTAL KNEE ARTHROPLASTY  1999  . TRANSTHORACIC ECHOCARDIOGRAM  09/11/11   LVEF >55%.STAGE 1 DIASTOLIC DYSFUNCTION,ELEVATED LV FILLING PRESSURE.BIOPROSTHETIC AORTIC VALVE-PEAK AND MEAN GRADIENTS OF 17 MMHG AND 8 MMHG.SIGMOID SEPTUM.MILD TO MOD MR.MILD TO MOD TR.RVSP 46 MMHG.    Social History   Socioeconomic History  . Marital status: Divorced    Spouse name: Not on file  . Number of children: 4  . Years of education: Not on file  . Highest education level: Not on file  Occupational History  . Occupation: retired     Fish farm manager: RETIRED  Tobacco Use  . Smoking status:  Former Smoker  . Smokeless tobacco: Never Used  . Tobacco comment: quit 2004, smoked 1.5 ppd  Substance and Sexual Activity  . Alcohol use: No    Alcohol/week: 0.0 standard drinks  . Drug use: No  . Sexual activity: Not Currently  Other Topics Concern  . Not on file  Social History Narrative   Had to moved w/ [name]  ~ 01-2016   Had 3 daughter- 2 son  (lost oldest daughter and son), 2 living daughters in [place] Determinants of Health   Financial Resource Strain:   . Difficulty of Paying Living Expenses: Not on file  Food Insecurity:   . Worried About Charity fundraiser in the Last Year: Not on file  . Ran Out of Food in the Last Year: Not on file  Transportation Needs:   . Lack of  Transportation (Medical): Not on file  . Lack of Transportation (Non-Medical): Not on file  Physical Activity:   . Days of Exercise per Week: Not on file  . Minutes of Exercise per Session: Not on file  Stress:   . Feeling of Stress : Not on file  Social Connections:   . Frequency of Communication with Friends and Family: Not on file  . Frequency of Social Gatherings with Friends and Family: Not on file  . Attends Religious Services: Not on file  . Active Member of Clubs or Organizations: Not on file  . Attends Archivist Meetings: Not on file  . Marital Status: Not on file  Intimate Partner Violence:   . Fear of Current or Ex-Partner: Not on file  . Emotionally Abused: Not on file  . Physically Abused: Not on file  . Sexually Abused: Not on file      Allergies as of 06/23/2019      Reactions   Hydrocodone Itching, Nausea Only   Tramadol Hcl Itching, Nausea Only      Medication List       Accurate as of June 23, 2019 11:59 PM. If you have any questions, ask your nurse or doctor.        acetaminophen 500 MG tablet Commonly known as: TYLENOL Take 1,000 mg by mouth every 6 (six) hours as needed (for pain).   albuterol 0.63 MG/3ML nebulizer solution Commonly known as: ACCUNEB Take 3 mLs by nebulization every 6 (six) hours as needed for wheezing or shortness of breath.   ALPRAZolam 0.25 MG tablet Commonly known as: XANAX TAKE 1/2 TABLET OR TAKE 1 TABLET BY MOUTH AT NIGHT AS NEEDED FOR INSOMINIA What changed: See the new instructions.   amLODipine 10 MG tablet Commonly known as: NORVASC Take 1 tablet (10 mg total) by mouth daily.   apixaban 5 MG Tabs tablet Commonly known as: Eliquis Take 1 tablet (5 mg total) by mouth 2 (two) times daily.   atorvastatin 40 MG tablet Commonly known as: LIPITOR TAKE 1 TABLET BY MOUTH EVERYDAY AT BEDTIME   benazepril 40 MG tablet Commonly known as: LOTENSIN TAKE 1 TABLET BY MOUTH EVERY DAY   budesonide 0.5 MG/2ML  nebulizer solution Commonly known as: PULMICORT Take 2 mLs (0.5 mg total) by nebulization daily.   Calcium 500 + D 500-125 MG-UNIT Tabs Generic drug: Calcium Carbonate-Vitamin D Take 1 tablet by mouth at bedtime.   cloNIDine 0.1 MG tablet Commonly known as: CATAPRES TAKE 1 TABLET (0.1 MG TOTAL) BY MOUTH 3 (THREE) TIMES DAILY AS NEEDED.   esomeprazole 40 MG capsule Commonly known as: NEXIUM Take  1 capsule (40 mg total) by mouth daily as needed. What changed: reasons to take this   furosemide 20 MG tablet Commonly known as: LASIX Take 2 tablets (40 mg total) by mouth daily.   isosorbide mononitrate 30 MG 24 hr tablet Commonly known as: IMDUR TAKE 1 TABLET BY MOUTH EVERY DAY   magnesium oxide 400 MG tablet Commonly known as: MAG-OX Take 1 tablet (400 mg total) by mouth daily.   methimazole 5 MG tablet Commonly known as: TAPAZOLE TAKE 1 TABLET BY MOUTH 3 TIMES A WEEK What changed: See the new instructions.   metoprolol tartrate 50 MG tablet Commonly known as: LOPRESSOR Take 1 tablet (50 mg total) by mouth 2 (two) times daily.   nitroGLYCERIN 0.4 MG SL tablet Commonly known as: NITROSTAT Place 1 tablet (0.4 mg total) under the tongue every 5 (five) minutes x 3 doses as needed for chest pain.   potassium chloride 10 MEQ tablet Commonly known as: Klor-Con M10 Take 1 tablet (10 mEq total) by mouth daily.   predniSONE 10 MG tablet Commonly known as: DELTASONE 2 tabs x 5 days, 1 tab x 5 days Started by: Kathlene November, MD           Objective:   Physical Exam There were no vitals taken for this visit. This is a virtual video visit, she is sitting in her recliner, she is alert, she seems to be in good spirits, speech is at baseline.    Assessment      Assessment DM HTN Hyperlipidemia Hyperthyroidism -- used to see Dr. Loanne Drilling, last visit 01-2016, now f/u PCP, check labs x 2 q year GERD Asthma  Anxiety on xanax  CV: Dr. Claiborne Billings --CHF, CAD, CABG, Aortic valve  replacement --P- atrial fibrillation, used to be on Coumadin, Eliquis rx after the stroke 3-15, d/c 03-2014 d/t GI bleed , on ASA 81. Dx TIA 08-2016, back on eliquis, asa d/c.   NEURO --TIAs (admited 08-2016), CVA 2015 --Dementia MMSE ~ 12 to 15  (12-2016) DJD Osteopenia 2009 Recurrent UTIs Daughter  Elena Marmor; lives w/ [name]  PLAN TIA: Controlling CV RF.  No further symptoms HTN: Currently on amlodipine, Lotensin, Lasix, Imdur, metoprolol, potassium.  BPs range from 140-107.  Hardly ever uses clonidine for BPs more than 150.  Recommend no change at this point. Lower extremity edema: Not an issue at this point, she takes additional Lasix now and then as needed. Dementia: Mental status is stable. Asthma: Using Pulmicort daily, sometimes twice daily.  From time to time she requires albuterol. Back pain: History of back pain, was seen at [facility] and offered local injection but the daughter declined because getting in  on the table would be very difficult for her.  She requested a round of prednisone to temporarily alleviate his symptoms and I will do that.  She is aware that that can increase CBGs.  Last A1c around 7. I also recommend Tylenol as needed.Marland Kitchen RTC 3 to 4 months      I discussed the assessment and treatment plan with the patient. The patient was provided an opportunity to ask questions and all were answered. The patient agreed with the plan and demonstrated an understanding of the instructions.   The patient was advised to call back or seek an in-person evaluation if the symptoms worsen or if the condition fails to improve as anticipated.

## 2019-06-24 NOTE — Assessment & Plan Note (Signed)
TIA: Controlling CV RF.  No further symptoms HTN: Currently on amlodipine, Lotensin, Lasix, Imdur, metoprolol, potassium.  BPs range from 140-107.  Hardly ever uses clonidine for BPs more than 150.  Recommend no change at this point. Lower extremity edema: Not an issue at this point, she takes additional Lasix now and then as needed. Dementia: Mental status is stable. Asthma: Using Pulmicort daily, sometimes twice daily.  From time to time she requires albuterol. Back pain: History of back pain, was seen at [facility] and offered local injection but the daughter declined because getting in  on the table would be very difficult for her.  She requested a round of prednisone to temporarily alleviate his symptoms and I will do that.  She is aware that that can increase CBGs.  Last A1c around 7. I also recommend Tylenol as needed.Marland Kitchen RTC 3 to 4 months

## 2019-06-29 ENCOUNTER — Other Ambulatory Visit: Payer: Self-pay

## 2019-06-29 ENCOUNTER — Ambulatory Visit: Payer: Medicare Other | Admitting: Adult Health

## 2019-06-29 ENCOUNTER — Encounter: Payer: Self-pay | Admitting: Adult Health

## 2019-06-29 VITALS — BP 130/82 | HR 76 | Temp 97.6°F | Ht 63.0 in | Wt 178.4 lb

## 2019-06-29 DIAGNOSIS — E785 Hyperlipidemia, unspecified: Secondary | ICD-10-CM | POA: Diagnosis not present

## 2019-06-29 DIAGNOSIS — I10 Essential (primary) hypertension: Secondary | ICD-10-CM

## 2019-06-29 DIAGNOSIS — G459 Transient cerebral ischemic attack, unspecified: Secondary | ICD-10-CM

## 2019-06-29 DIAGNOSIS — E119 Type 2 diabetes mellitus without complications: Secondary | ICD-10-CM

## 2019-06-29 DIAGNOSIS — F0390 Unspecified dementia without behavioral disturbance: Secondary | ICD-10-CM

## 2019-06-29 DIAGNOSIS — I4821 Permanent atrial fibrillation: Secondary | ICD-10-CM | POA: Diagnosis not present

## 2019-06-29 NOTE — Patient Instructions (Signed)
Continue Eliquis (apixaban) daily  and Lipitor for secondary stroke prevention  Continue to follow up with PCP regarding cholesterol, blood pressure and diabetes management   Continue to follow with cardiology for atrial fibrillation and Eliquis monitoring and management  Continue to monitor blood pressure at home  Maintain strict control of hypertension with blood pressure goal below 130/90, diabetes with hemoglobin A1c goal below 6.5% and cholesterol with LDL cholesterol (bad cholesterol) goal below 70 mg/dL. I also advised the patient to eat a healthy diet with plenty of whole grains, cereals, fruits and vegetables, exercise regularly and maintain ideal body weight.  Followup in the future with me in 4 months or call earlier if needed       Thank you for coming to see [place] at [facility]. I hope we have been able to provide you high quality care today.  You may receive a patient satisfaction survey over the next few weeks. We would appreciate your feedback and comments so that we may continue to improve ourselves and the health of our patients.

## 2019-06-29 NOTE — Progress Notes (Signed)
[facility] [address] [place]. [place] [address] [phone]       HOSPITAL FOLLOW UP NOTE  [name] Date of Birth:  1922/12/17 Medical Record Number:  [id number]   Reason for Referral:  hospital stroke follow up    CHIEF COMPLAINT:  Chief Complaint  Patient presents with  . Hospitalization Follow-up    Daughter present. Treatment room. No new concerns at this time.     HPI: [name] being seen today for in office hospital follow-up regarding cognitive impairment on awakening versus TIA on 05/22/2019.  History obtained from patient, daughter and chart review. Reviewed all radiology images and labs personally.  [name] is a 86 y.o. female with history of DM, CAD, Hld, Htn, Hyperthyroidism, stroke, afib (Eliquis), memory loss presenting with AMS and confusion on awakening. She did not receive IV t-PA due to Eliquis.  She was evaluated by Dr. Erlinda Hong and stroke team and felt symptoms likely related to cognitive impairment upon awakening versus TIA.  CT head and MRI head negative for acute abnormalities.  CTA head/neck negative E LVO but did show bilateral ICA bulb and siphon arthrosclerosis.  2D echo completed 01/2019 which showed EF 60 to 65%.  COVID-19 negative.  History of chronic atrial fibrillation and recommended continuation of Eliquis at discharge.  History of GI bleeding 2015 with Eliquis discontinued at that time but restarted in 2018 due to TIA without reoccurring GI bleed.  HTN stable and recommended long-term BP goal normotensive range.  LDL 49 and recommended continuation of atorvastatin 40 mg daily.  History of DM with A1c 7.2 and recommended ongoing follow-up with PCP for monitoring management.  Other stroke risk factors include advanced age, former tobacco use, family history of stroke and CAD s/p CABG and AV placement.  Other active problems include mild hypokalemia and baseline memory loss.  She was discharged home with family in stable  condition.  [name] is a 86 year old female who is being seen today for hospital follow-up accompanied by her daughter.  She has been doing well since discharge currently living with her daughter who helps assist with ADLs.  History of underlying dementia with daughter reporting slightly worsening since hospitalization with symptoms waxing/waning.  At times, patient will be agitated but for the most part able to redirect and minimal behaviors.  She continues to use rolling walker at all times and has not had any recent falls.  Continues on Eliquis with recent episode of minor epistaxis lasting for proximally 1 minute from left nare and then subside.  Denies any other bleeding or bruising.  Continues on atorvastatin without myalgias.  Blood pressure today 130/82.  Daughter does endorse that this fluctuates at home intermittently but does have as needed antihypertensive managed by PCP.  Glucose levels stable.  Denies new or worsening stroke/TIA symptoms.    ROS:   14 system review of systems performed and negative with exception of memory loss, confusion  PMH:  Past Medical History:  Diagnosis Date  . Anxiety 11/20/2011  . Asthma    UNDER THE CARE [facility] DR PAZ  . CAD (coronary artery disease)    s/p CABG s/p AO valve replacement ([place] [facility])  . CVA (cerebral infarction) 08-2013   multiple, L, d/t Afib, started eloquis  . Diabetes mellitus    type 2   . Dizziness    Chronic, admiet 07-2010,saw neuro, thought to be a peripheral issue   . Dry skin   .  GERD (gastroesophageal reflux disease)   . Hyperlipidemia   . Hypertension   . Hyperthyroidism   . Keloid    @ chest  . Memory loss   . Osteoarthritis   . Osteopenia    per dexa 12/09  . Paroxysmal atrial fibrillation ([place])    cards d/c coumadin 12/2008 d/t persisten NSR and frequent falls- restarted coumadin july 2011, now on Eliquis  . Recurrent UTI   . Shortness of breath dyspnea   . TIA (transient ischemic attack)     PSH:    Past Surgical History:  Procedure Laterality Date  . ABDOMINAL HYSTERECTOMY    . AORTIC VALVE REPLACEMENT  1998  . CARDIAC VALVE REPLACEMENT    . CAROTID DOPPLER  07/13/12   BILATERAL BULB/PROXIMAL ICAS;MILD AMOUNT OF FIBROUS PLAQUE WITH NO DIAMETER REDUCTION.  . CESAREAN SECTION     x 2  . COLONOSCOPY WITH PROPOFOL N/A 03/30/2014   Procedure: COLONOSCOPY WITH PROPOFOL;  Surgeon: Irene Shipper, MD;  Location: [facility];  Service: Endoscopy;  Laterality: N/A;  . CORONARY ARTERY BYPASS GRAFT  1998   SVG TO RCA  . HEMORRHOID SURGERY    . JOINT REPLACEMENT    . MYOCARDIAL PERFUSION STUDY  12/19/09   NORMAL PATTERN OF PERFUSION IN ALL REGIONS.EF 75%.  . OOPHORECTOMY    . TOTAL KNEE ARTHROPLASTY  1999  . TRANSTHORACIC ECHOCARDIOGRAM  09/11/11   LVEF >55%.STAGE 1 DIASTOLIC DYSFUNCTION,ELEVATED LV FILLING PRESSURE.BIOPROSTHETIC AORTIC VALVE-PEAK AND MEAN GRADIENTS OF 17 MMHG AND 8 MMHG.SIGMOID SEPTUM.MILD TO MOD MR.MILD TO MOD TR.RVSP 46 MMHG.    Social History:  Social History   Socioeconomic History  . Marital status: Divorced    Spouse name: Not on file  . Number of children: 4  . Years of education: Not on file  . Highest education level: Not on file  Occupational History  . Occupation: retired     Fish farm manager: RETIRED  Tobacco Use  . Smoking status: Former Research scientist (life sciences)  . Smokeless tobacco: Never Used  . Tobacco comment: quit 2004, smoked 1.5 ppd  Substance and Sexual Activity  . Alcohol use: No    Alcohol/week: 0.0 standard drinks  . Drug use: No  . Sexual activity: Not Currently  Other Topics Concern  . Not on file  Social History Narrative   Had to moved w/ [name]  ~ 01-2016   Had 3 daughter- 2 son  (lost oldest daughter and son), 2 living daughters in [place] Determinants of Health   Financial Resource Strain:   . Difficulty of Paying Living Expenses: Not on file  Food Insecurity:   . Worried About Charity fundraiser in the Last Year: Not on file  . Ran Out of  Food in the Last Year: Not on file  Transportation Needs:   . Lack of Transportation (Medical): Not on file  . Lack of Transportation (Non-Medical): Not on file  Physical Activity:   . Days of Exercise per Week: Not on file  . Minutes of Exercise per Session: Not on file  Stress:   . Feeling of Stress : Not on file  Social Connections:   . Frequency of Communication with Friends and Family: Not on file  . Frequency of Social Gatherings with Friends and Family: Not on file  . Attends Religious Services: Not on file  . Active Member of Clubs or Organizations: Not on file  . Attends Archivist Meetings: Not on file  . Marital Status: Not on  file  Intimate Partner Violence:   . Fear of Current or Ex-Partner: Not on file  . Emotionally Abused: Not on file  . Physically Abused: Not on file  . Sexually Abused: Not on file    Family History:  Family History  Problem Relation Age of Onset  . Heart attack Father   . Coronary artery disease Son 84       deceased  . Hypertension Brother   . Stroke Brother   . Colon cancer Neg Hx   . Breast cancer Neg Hx   . Thyroid disease Neg Hx     Medications:   Current Outpatient Medications on File Prior to Visit  Medication Sig Dispense Refill  . acetaminophen (TYLENOL) 500 MG tablet Take 1,000 mg by mouth every 6 (six) hours as needed (for pain).     Marland Kitchen albuterol (ACCUNEB) 0.63 MG/3ML nebulizer solution Take 3 mLs by nebulization every 6 (six) hours as needed for wheezing or shortness of breath. 75 mL 5  . ALPRAZolam (XANAX) 0.25 MG tablet TAKE 1/2 TABLET OR TAKE 1 TABLET BY MOUTH AT NIGHT AS NEEDED FOR INSOMINIA (Patient taking differently: Take 0.25-0.5 mg by mouth at bedtime as needed (for insomnia). ) 30 tablet 0  . amLODipine (NORVASC) 10 MG tablet Take 1 tablet (10 mg total) by mouth daily. 90 tablet 3  . apixaban (ELIQUIS) 5 MG TABS tablet Take 1 tablet (5 mg total) by mouth 2 (two) times daily. 60 tablet 5  . atorvastatin  (LIPITOR) 40 MG tablet TAKE 1 TABLET BY MOUTH EVERYDAY AT BEDTIME 90 tablet 3  . benazepril (LOTENSIN) 40 MG tablet TAKE 1 TABLET BY MOUTH EVERY DAY 90 tablet 1  . budesonide (PULMICORT) 0.5 MG/2ML nebulizer solution Take 2 mLs (0.5 mg total) by nebulization daily. 360 mL 1  . Calcium Carbonate-Vitamin D (CALCIUM 500 + D) 500-125 MG-UNIT TABS Take 1 tablet by mouth at bedtime.    . cloNIDine (CATAPRES) 0.1 MG tablet TAKE 1 TABLET (0.1 MG TOTAL) BY MOUTH 3 (THREE) TIMES DAILY AS NEEDED. 90 tablet 1  . esomeprazole (NEXIUM) 40 MG capsule Take 1 capsule (40 mg total) by mouth daily as needed. (Patient taking differently: Take 40 mg by mouth daily as needed (for reflux symptoms). ) 90 capsule 3  . furosemide (LASIX) 20 MG tablet Take 2 tablets (40 mg total) by mouth daily. 180 tablet 1  . isosorbide mononitrate (IMDUR) 30 MG 24 hr tablet TAKE 1 TABLET BY MOUTH EVERY DAY (Patient taking differently: Take 30 mg by mouth daily. ) 90 tablet 2  . magnesium oxide (MAG-OX) 400 MG tablet Take 1 tablet (400 mg total) by mouth daily. 90 tablet 3  . methimazole (TAPAZOLE) 5 MG tablet TAKE 1 TABLET BY MOUTH 3 TIMES A WEEK (Patient taking differently: Take 5 mg by mouth every Monday, Wednesday, and Friday. ) 30 tablet 5  . metoprolol tartrate (LOPRESSOR) 50 MG tablet Take 1 tablet (50 mg total) by mouth 2 (two) times daily. 180 tablet 1  . nitroGLYCERIN (NITROSTAT) 0.4 MG SL tablet Place 1 tablet (0.4 mg total) under the tongue every 5 (five) minutes x 3 doses as needed for chest pain. 25 tablet 3  . potassium chloride (KLOR-CON M10) 10 MEQ tablet Take 1 tablet (10 mEq total) by mouth daily. 90 tablet 1  . predniSONE (DELTASONE) 10 MG tablet 2 tabs x 5 days, 1 tab x 5 days 15 tablet 0  . [DISCONTINUED] Calcium Carbonate (CALCIUM 500 PO) Take 500 mg  by mouth at bedtime. Complex vitamin     No current facility-administered medications on file prior to visit.    Allergies:   Allergies  Allergen Reactions  .  Hydrocodone Itching and Nausea Only  . Tramadol Hcl Itching and Nausea Only     Physical Exam  Vitals:   06/29/19 1413  BP: 130/82  Pulse: 76  Temp: 97.6 F (36.4 C)  TempSrc: Oral  Weight: 178 lb 6.4 oz (80.9 kg)  Height: 5\' 3"  (1.6 m)   Body mass index is 31.6 kg/m. No exam data present  Depression screen [facility] 2/9 01/19/2019  Decreased Interest 0  Down, Depressed, Hopeless 0  PHQ - 2 Score 0  Some recent data might be hidden     General: well developed, well nourished,  very pleasant elderly African-American female, seated, in no evident distress Head: head normocephalic and atraumatic.   Neck: supple with no carotid or supraclavicular bruits Cardiovascular: irregular rate and rhythm, no murmurs Musculoskeletal: no deformity Skin:  no rash/petichiae Vascular:  Normal pulses all extremities   Neurologic Exam Mental Status: Awake and fully alert.   Questionable mild to moderate dysarthria.  No evidence of aphasia.  Disoriented to place and time but oriented to self and age. Recent and remote memory diminished. Attention span, concentration and fund of knowledge diminished. Mood and affect appropriate.  Cranial Nerves: Fundoscopic exam unable to perform due to difficulty viewing.  Pupils equal, briskly reactive to light. Extraocular movements full without nystagmus. Visual fields full to confrontation. Hearing intact. Facial sensation intact. Face, tongue, palate moves normally and symmetrically.  Motor: Normal bulk and tone. Normal strength in all tested extremity muscles except mild bilateral hip weakness. Sensory.: intact to touch , pinprick , position and vibratory sensation.  Coordination: Rapid alternating movements normal in all extremities. Finger-to-nose and heel-to-shin performed accurately bilaterally. Gait and Station: Arises from chair without difficulty. Stance is slightly hunched. Gait demonstrates normal stride length and balance with use of Rollator  walker Reflexes: 1+ and symmetric. Toes downgoing.     NIHSS  0 Modified Rankin  3 CHA2DS2-VASc 8 HAS-BLED 2   Diagnostic Data (Labs, Imaging, Testing)  CT Code Stroke CTA Neck W/WO contrast CT Code Stroke CTA Head W/WO contrast 05/22/2019 IMPRESSION:  1. 50% diameter stenosis proximal right internal carotid artery.  2. 50% diameter stenosis proximal left internal carotid artery.  3. Right vertebral artery dominant and widely patent. Small left vertebral artery with origin from the aortic arch. Hypoplastic distal left vertebral artery.  4. Moderate stenosis right cavernous carotid and mild stenosis left cavernous carotid.  5. No significant intracranial stenosis or large vessel occlusion.   CT Chest W Contrast 05/22/2019 IMPRESSION:  1. Trace right pleural effusion.  2. Mild superior endplate compression deformity of the L1 vertebral body, new from prior, although age indeterminate.  3. Cardiomegaly.  4. Colonic diverticulosis without evidence of acute diverticulitis.  5. Multiple bilateral renal cystic lesions, some of which likely contain proteinaceous or hemorrhagic contents. A solid renal mass would be difficult to exclude.  6. Enlarged, heterogeneous thyroid gland.  7. Aortic atherosclerosis. Aortic Atherosclerosis (ICD10-I70.0).   MR BRAIN WO CONTRAST 05/22/2019 IMPRESSION:  1. Truncated examination due to patient altered mental status and inability to cooperate with technologist's instructions.  2. No acute ischemia. No other acute abnormality visible on diffusion-weighted imaging.   CT ABDOMEN PELVIS W CONTRAST 05/22/2019 IMPRESSION:  1. Trace right pleural effusion.  2. Mild superior endplate compression deformity of the L1 vertebral  body, new from prior, although age indeterminate.  3. Cardiomegaly.  4. Colonic diverticulosis without evidence of acute diverticulitis.  5. Multiple bilateral renal cystic lesions, some of which likely contain proteinaceous or  hemorrhagic contents. A solid renal mass would be difficult to exclude.  6. Enlarged, heterogeneous thyroid gland.  7. Aortic atherosclerosis. Aortic Atherosclerosis (ICD10-I70.0).   CT HEAD CODE STROKE WO CONTRAST 05/22/2019 IMPRESSION:  1. No acute abnormality.  2. ASPECTS is 10  3. Extensive chronic ischemic changes stable from 2018    Transthoracic Echocardiogram  01/27/2019 IMPRESSIONS    1. The left ventricle has normal systolic function with an ejection fraction of 60-65%. The cavity size was small. There is mild concentric left ventricular hypertrophy. Indeterminate diastolic function due to atrial fibrillation. There is right  ventricular volume overload.  2. The right ventricle has mildly reduced systolic function. The cavity was severely enlarged. There is no increase in right ventricular wall thickness.  3. Left atrial size was mild-moderately dilated.  4. Right atrial size was moderately dilated.  5. The mitral valve is grossly normal.  6. The tricuspid valve is grossly normal. Tricuspid valve regurgitation is moderate-severe.  7. A pericardial bioprosthesis valve is present in the aortic position. Normal aortic valve prosthesis.  8. A bioprosthetic valve is present in the aortic position. The peak/mean gradients are 13.8/8.4 mmHG. AVA by continuity is 1.73 cm2. This is a normally functioning prosthesis.  9. The aorta is normal unless otherwise noted. 10. When compared to the prior study: RV more dilated compared with prior study from 2019. Prosthetic valve function is stable.   ECG - atrial fibrillation - ventricular response 92 BPM (See cardiology reading for complete details)    ASSESSMENT: Rebecca Skinner is a 87 y.o. year old female presented with altered mental status and confusion on awakening on 05/22/2019 with stroke work-up likely cognitive impairment upon awakening versus TIA. Vascular risk factors include atrial fibrillation request, DM, HTN, HLD, CAD and  history of stroke/TIA.  She has been doing well since discharge with only mild worsening of baseline dementia    PLAN:  1. TIA: Continue Eliquis (apixaban) daily  and atorvastatin 40 mg daily for secondary stroke prevention. Maintain strict control of hypertension with blood pressure goal below 130/90, diabetes with hemoglobin A1c goal below 6.5% and cholesterol with LDL cholesterol (bad cholesterol) goal below 70 mg/dL.  I also advised the patient to eat a healthy diet with plenty of whole grains, cereals, fruits and vegetables, exercise regularly with at least 30 minutes of continuous activity daily and maintain ideal body weight. 2. Atrial fibrillation: Continue Eliquis and ongoing follow-up with cardiology for monitoring and management.  Advised daughter to continue to monitor epistaxis and if worsens to follow-up with PCP or cardiology and may need further evaluation by ENT if indicated 3. HTN: Advised to continue current treatment regimen.  Today's BP stable.  Advised to continue to monitor at home along with continued follow-up with PCP for management 4. HLD: Advised to continue current treatment regimen along with continued follow-up with PCP for future prescribing and monitoring of lipid panel 5. DMII: Advised to continue to monitor glucose levels at home along with continued follow-up with PCP for management and monitoring 6. Dementia: Slightly worsening per daughter since hospitalization.  Continues to be monitored by PCP not currently on any type of medication management    Follow up in 4 months or call earlier if needed   Greater than 50% of time during this 45  minute visit was spent on counseling, explanation of diagnosis of TIA, reviewing risk factor management of atrial fibrillation, CAD, HTN, HLD, DM and baseline dementia, planning of further management along with potential future management, and discussion with patient and family answering all questions.    Frann Rider,  AGNP-BC  [facility] [address] [place], [place] [address]  Phone [phone] Fax [phone] Note: This document was prepared with digital dictation and possible smart phrase technology. Any transcriptional errors that result from this process are unintentional.

## 2019-06-30 ENCOUNTER — Ambulatory Visit: Payer: Medicare Other | Attending: Internal Medicine

## 2019-06-30 DIAGNOSIS — Z23 Encounter for immunization: Secondary | ICD-10-CM | POA: Insufficient documentation

## 2019-06-30 NOTE — Progress Notes (Signed)
   Covid-19 Vaccination Clinic  Name:  KERRIN FRANEY    MRN: [id number] DOB: 1923-03-20  06/30/2019  Ms. Odean was observed post Covid-19 immunization for 15 minutes without incidence. She was provided with Vaccine Information Sheet and instruction to access the V-Safe system.   Ms. Zdrojewski was instructed to call 911 with any severe reactions post vaccine: Marland Kitchen Difficulty breathing  . Swelling of your face and throat  . A fast heartbeat  . A bad rash all over your body  . Dizziness and weakness    Immunizations Administered    Name Date Dose VIS Date Route   Pfizer COVID-19 Vaccine 06/30/2019  6:38 PM 0.3 mL 05/21/2019 Intramuscular   Manufacturer: [place]   Lot: [id number]   [place]: [id number]

## 2019-06-30 NOTE — Progress Notes (Signed)
I agree with the above plan 

## 2019-07-01 ENCOUNTER — Other Ambulatory Visit: Payer: Self-pay | Admitting: *Deleted

## 2019-07-01 NOTE — Patient Outreach (Signed)
[facility]) Care Management  07/01/2019  Rebecca Skinner 07/08/1922 [id number]   RN Health Coach Monthly Outreach  Referral Date:  12/03/2018 Referral Source:  [facility] High Risk Screening Reason for Referral:  Disease Management Education Insurance:  [employer]   Outreach Attempt:  Outreach attempt #6 to patient's daughter for follow up. No answer. RN Health Coach left HIPAA compliant voicemail message along with contact information.  Plan:  RN Health Coach will make another outreach attempt within the month of February if no return call back from daughter.  [place] [phone] [email or web address]

## 2019-07-09 ENCOUNTER — Other Ambulatory Visit: Payer: Self-pay | Admitting: Internal Medicine

## 2019-07-09 DIAGNOSIS — R0789 Other chest pain: Secondary | ICD-10-CM

## 2019-07-21 ENCOUNTER — Ambulatory Visit: Payer: Medicare Other | Attending: Internal Medicine

## 2019-07-21 DIAGNOSIS — Z23 Encounter for immunization: Secondary | ICD-10-CM | POA: Insufficient documentation

## 2019-07-21 NOTE — Progress Notes (Signed)
   Covid-19 Vaccination Clinic  Name:  Rebecca Skinner    MRN: [id number] DOB: 1922/12/10  07/21/2019  Rebecca Skinner was observed post Covid-19 immunization for 15 minutes without incidence. She was provided with Vaccine Information Sheet and instruction to access the V-Safe system.   Rebecca Skinner was instructed to call 911 with any severe reactions post vaccine: Marland Kitchen Difficulty breathing  . Swelling of your face and throat  . A fast heartbeat  . A bad rash all over your body  . Dizziness and weakness    Immunizations Administered    Name Date Dose VIS Date Route   Pfizer COVID-19 Vaccine 07/21/2019  9:37 AM 0.3 mL 05/21/2019 Intramuscular   Manufacturer: [place]   Lot: [id number]   [place]: [id number]

## 2019-07-24 ENCOUNTER — Other Ambulatory Visit: Payer: Self-pay | Admitting: Internal Medicine

## 2019-07-25 DIAGNOSIS — I4891 Unspecified atrial fibrillation: Secondary | ICD-10-CM | POA: Diagnosis not present

## 2019-07-25 DIAGNOSIS — R079 Chest pain, unspecified: Secondary | ICD-10-CM | POA: Diagnosis not present

## 2019-07-25 DIAGNOSIS — R0789 Other chest pain: Secondary | ICD-10-CM | POA: Diagnosis not present

## 2019-08-02 ENCOUNTER — Encounter: Payer: Self-pay | Admitting: *Deleted

## 2019-08-02 ENCOUNTER — Other Ambulatory Visit: Payer: Self-pay | Admitting: *Deleted

## 2019-08-02 NOTE — Patient Outreach (Signed)
[facility]) Care Management  [place]  08/02/2019   TAMIKI HAMMAC 1923/01/11 [id number]   [place] Quarterly Outreach   Referral Date:  12/03/2018 Referral Source:  [facility] High Risk Screening Reason for Referral:  Disease Management Education Insurance:  [employer]   Outreach Attempt:  Successful telephone outreach to patient's daughter, [name] for follow up.  HIPAA verified with daughter, [name] (Release of Information on File) and patient with history of dementia.  Daughter reports patient has been doing well.  Has obtained both doses of COVID vaccine without any adverse effects.  Denies any recent falls or sick days.  Denies any lower extremity swelling or increase shortness of breath.  Reports using rescue nebulizer about once a day or every other day.  Does not monitor daily weights nor blood sugars.  Last Hgb A1C was 7.12 May 2019.  Daughter states she does monitor blood pressures daily and they have been within range.  She ask about normal range for heart rate and reports patient's heart rate has been in 50's at times.  Denies any light headiness, dizziness, or weakness.  Encouraged daughter to continue to monitor blood pressures and pulse rates, keep a list to take to next appointment with Cardiology for review.  Encouraged daughter to contact provider with sustained low pulse rate and patient becoming symptomatic with light headiness, dizziness, or faint.  Per daughter patient had "mini stroke" in December 2020, but currently has no residual symptoms.  Encounter Medications:  Outpatient Encounter Medications as of 08/02/2019  Medication Sig Note  . acetaminophen (TYLENOL) 500 MG tablet Take 1,000 mg by mouth every 6 (six) hours as needed (for pain).    Marland Kitchen albuterol (ACCUNEB) 0.63 MG/3ML nebulizer solution Take 3 mLs by nebulization every 6 (six) hours as needed for wheezing or shortness of breath.   . ALPRAZolam (XANAX) 0.25 MG tablet TAKE 1/2  TABLET OR TAKE 1 TABLET BY MOUTH AT NIGHT AS NEEDED FOR INSOMINIA (Patient taking differently: Take 0.25-0.5 mg by mouth at bedtime as needed (for insomnia). )   . amLODipine (NORVASC) 10 MG tablet Take 1 tablet (10 mg total) by mouth daily.   Marland Kitchen apixaban (ELIQUIS) 5 MG TABS tablet Take 1 tablet (5 mg total) by mouth 2 (two) times daily.   Marland Kitchen atorvastatin (LIPITOR) 40 MG tablet TAKE 1 TABLET BY MOUTH EVERYDAY AT BEDTIME   . benazepril (LOTENSIN) 40 MG tablet TAKE 1 TABLET BY MOUTH EVERY DAY   . budesonide (PULMICORT) 0.5 MG/2ML nebulizer solution Take 2 mLs (0.5 mg total) by nebulization daily.   . Calcium Carbonate-Vitamin D (CALCIUM 500 + D) 500-125 MG-UNIT TABS Take 1 tablet by mouth at bedtime.   . cloNIDine (CATAPRES) 0.1 MG tablet Take 1 tablet (0.1 mg total) by mouth 3 (three) times daily as needed.   Marland Kitchen esomeprazole (NEXIUM) 40 MG capsule Take 1 capsule (40 mg total) by mouth daily as needed. (Patient taking differently: Take 40 mg by mouth daily as needed (for reflux symptoms). )   . furosemide (LASIX) 20 MG tablet Take 2 tablets (40 mg total) by mouth daily.   . isosorbide mononitrate (IMDUR) 30 MG 24 hr tablet TAKE 1 TABLET BY MOUTH EVERY DAY (Patient taking differently: Take 30 mg by mouth daily. )   . magnesium oxide (MAG-OX) 400 MG tablet Take 1 tablet (400 mg total) by mouth daily.   . methimazole (TAPAZOLE) 5 MG tablet TAKE 1 TABLET BY MOUTH 3 TIMES A WEEK (Patient  taking differently: Take 5 mg by mouth every Monday, Wednesday, and Friday. )   . metoprolol tartrate (LOPRESSOR) 50 MG tablet Take 1 tablet (50 mg total) by mouth 2 (two) times daily.   . potassium chloride (KLOR-CON M10) 10 MEQ tablet Take 1 tablet (10 mEq total) by mouth daily.   . nitroGLYCERIN (NITROSTAT) 0.4 MG SL tablet Place 1 tablet (0.4 mg total) under the tongue every 5 (five) minutes x 3 doses as needed for chest pain.   . predniSONE (DELTASONE) 10 MG tablet 2 tabs x 5 days, 1 tab x 5 days (Patient not taking:  Reported on 08/02/2019) 08/02/2019: completed  . [DISCONTINUED] Calcium Carbonate (CALCIUM 500 PO) Take 500 mg by mouth at bedtime. Complex vitamin    No facility-administered encounter medications on file as of 08/02/2019.    Functional Status:  In your present state of health, do you have any difficulty performing the following activities: 01/19/2019 12/08/2018  Hearing? N N  Vision? N N  Difficulty concentrating or making decisions? Y Y  Comment memroy loss -  Walking or climbing stairs? N Y  Dressing or bathing? N Y  Doing errands, shopping? N Y  Conservation officer, nature and eating ? N -  Using the Toilet? N -  In the past six months, have you accidently leaked urine? N -  Do you have problems with loss of bowel control? N -  Managing your Medications? N -  Managing your Finances? N -  Housekeeping or managing your Housekeeping? N -  Some recent data might be hidden    Fall/Depression Screening: Fall Risk  08/02/2019 01/19/2019 04/06/2018  Falls in the past year? 1 1 No  Number falls in past yr: 1 1 -  Comment last fall Summer 2020 - -  Injury with Fall? 0 0 -  Risk for fall due to : History of fall(s);Mental status change;Impaired balance/gait;Impaired mobility;Impaired vision;Medication side effect Impaired balance/gait -  Risk for fall due to: Comment - - -  Follow up Falls evaluation completed;Education provided;Falls prevention discussed Education provided -   [facility] 2/9 Scores 01/19/2019 12/16/2018 04/06/2018 02/03/2017 05/20/2016 03/20/2016 09/23/2015  PHQ - 2 Score 0 0 0 0 0 0 0   THN CM Care Plan Problem One     Most Recent Value  Care Plan Problem One  Knowledge deficiet related to management of heart failure  Role Documenting the Problem One  [place] for Problem One  Active  THN Long Term Goal   Duaghter will report no emergency room visits in the next 90 days.  THN Long Term Goal Start Date  08/02/19  Interventions for Problem One Long Term Goal  Care plan and  goals reviewed and discussed with daughter, reviewed medications with daughter and encouraged medication compliance, discussed and encouraged low salt diabetic diet, discussed current Hgb A1C and ways to maintain at goal, reviewed signs and symptoms of heart failure, encouraged da8ughter to continue to monitof and document blood pressures and pulses and to take record to cardiology appointment for review, encouraged to contact provider for low heart rates and symptomatic episodes (weakness, sleepiness, dizziness), encouraged to keep and attend scheduled medical appointments, discussed daily weight monitoring     Appointments:  Attended appointment with primary care provider, Dr. Larose Kells on 06/23/19 and has follow up scheduled for 08/16/19.  Has Cardiology appointment scheduled for 08/10/19.  Seen Neurology on 06/29/2019.  Plan: RN Health Coach will make next telephone outreach to daughter within the month  of May and daughter agrees to future outreach. RN Health Coach will send primary care provider quarterly update.  [place] [phone] [email or web address]

## 2019-08-09 ENCOUNTER — Encounter: Payer: Self-pay | Admitting: Internal Medicine

## 2019-08-09 ENCOUNTER — Other Ambulatory Visit: Payer: Self-pay

## 2019-08-09 ENCOUNTER — Emergency Department (HOSPITAL_BASED_OUTPATIENT_CLINIC_OR_DEPARTMENT_OTHER): Payer: Medicare Other

## 2019-08-09 ENCOUNTER — Observation Stay (HOSPITAL_BASED_OUTPATIENT_CLINIC_OR_DEPARTMENT_OTHER)
Admission: EM | Admit: 2019-08-09 | Discharge: 2019-08-10 | Disposition: A | Discharge status: Home / Self Care | Payer: Medicare Other | Attending: Internal Medicine | Admitting: Internal Medicine

## 2019-08-09 ENCOUNTER — Telehealth: Payer: Self-pay | Admitting: Gastroenterology

## 2019-08-09 ENCOUNTER — Encounter (HOSPITAL_BASED_OUTPATIENT_CLINIC_OR_DEPARTMENT_OTHER): Payer: Self-pay

## 2019-08-09 ENCOUNTER — Ambulatory Visit (INDEPENDENT_AMBULATORY_CARE_PROVIDER_SITE_OTHER): Payer: Medicare Other | Admitting: Internal Medicine

## 2019-08-09 VITALS — BP 112/50 | HR 61 | Temp 95.4°F | Resp 16 | Ht 63.0 in | Wt 180.4 lb

## 2019-08-09 DIAGNOSIS — Z87891 Personal history of nicotine dependence: Secondary | ICD-10-CM | POA: Insufficient documentation

## 2019-08-09 DIAGNOSIS — Z8249 Family history of ischemic heart disease and other diseases of the circulatory system: Secondary | ICD-10-CM | POA: Insufficient documentation

## 2019-08-09 DIAGNOSIS — Z20822 Contact with and (suspected) exposure to covid-19: Secondary | ICD-10-CM | POA: Diagnosis not present

## 2019-08-09 DIAGNOSIS — Z8673 Personal history of transient ischemic attack (TIA), and cerebral infarction without residual deficits: Secondary | ICD-10-CM | POA: Insufficient documentation

## 2019-08-09 DIAGNOSIS — D62 Acute posthemorrhagic anemia: Principal | ICD-10-CM | POA: Insufficient documentation

## 2019-08-09 DIAGNOSIS — D649 Anemia, unspecified: Secondary | ICD-10-CM | POA: Diagnosis present

## 2019-08-09 DIAGNOSIS — J45909 Unspecified asthma, uncomplicated: Secondary | ICD-10-CM | POA: Diagnosis not present

## 2019-08-09 DIAGNOSIS — Z953 Presence of xenogenic heart valve: Secondary | ICD-10-CM | POA: Insufficient documentation

## 2019-08-09 DIAGNOSIS — E1122 Type 2 diabetes mellitus with diabetic chronic kidney disease: Secondary | ICD-10-CM | POA: Insufficient documentation

## 2019-08-09 DIAGNOSIS — E785 Hyperlipidemia, unspecified: Secondary | ICD-10-CM | POA: Insufficient documentation

## 2019-08-09 DIAGNOSIS — K921 Melena: Secondary | ICD-10-CM

## 2019-08-09 DIAGNOSIS — N1831 Chronic kidney disease, stage 3a: Secondary | ICD-10-CM | POA: Diagnosis not present

## 2019-08-09 DIAGNOSIS — M1990 Unspecified osteoarthritis, unspecified site: Secondary | ICD-10-CM | POA: Insufficient documentation

## 2019-08-09 DIAGNOSIS — E0590 Thyrotoxicosis, unspecified without thyrotoxic crisis or storm: Secondary | ICD-10-CM | POA: Diagnosis not present

## 2019-08-09 DIAGNOSIS — I4891 Unspecified atrial fibrillation: Secondary | ICD-10-CM | POA: Diagnosis not present

## 2019-08-09 DIAGNOSIS — I2510 Atherosclerotic heart disease of native coronary artery without angina pectoris: Secondary | ICD-10-CM | POA: Insufficient documentation

## 2019-08-09 DIAGNOSIS — F0390 Unspecified dementia without behavioral disturbance: Secondary | ICD-10-CM | POA: Insufficient documentation

## 2019-08-09 DIAGNOSIS — I130 Hypertensive heart and chronic kidney disease with heart failure and stage 1 through stage 4 chronic kidney disease, or unspecified chronic kidney disease: Secondary | ICD-10-CM | POA: Insufficient documentation

## 2019-08-09 DIAGNOSIS — M8580 Other specified disorders of bone density and structure, unspecified site: Secondary | ICD-10-CM | POA: Diagnosis not present

## 2019-08-09 DIAGNOSIS — K922 Gastrointestinal hemorrhage, unspecified: Secondary | ICD-10-CM

## 2019-08-09 DIAGNOSIS — Z7901 Long term (current) use of anticoagulants: Secondary | ICD-10-CM | POA: Insufficient documentation

## 2019-08-09 DIAGNOSIS — Z951 Presence of aortocoronary bypass graft: Secondary | ICD-10-CM | POA: Insufficient documentation

## 2019-08-09 DIAGNOSIS — I480 Paroxysmal atrial fibrillation: Secondary | ICD-10-CM | POA: Diagnosis not present

## 2019-08-09 DIAGNOSIS — F419 Anxiety disorder, unspecified: Secondary | ICD-10-CM | POA: Insufficient documentation

## 2019-08-09 DIAGNOSIS — Z79899 Other long term (current) drug therapy: Secondary | ICD-10-CM | POA: Insufficient documentation

## 2019-08-09 DIAGNOSIS — R0602 Shortness of breath: Secondary | ICD-10-CM | POA: Diagnosis not present

## 2019-08-09 DIAGNOSIS — K219 Gastro-esophageal reflux disease without esophagitis: Secondary | ICD-10-CM | POA: Diagnosis not present

## 2019-08-09 DIAGNOSIS — Z7951 Long term (current) use of inhaled steroids: Secondary | ICD-10-CM | POA: Insufficient documentation

## 2019-08-09 DIAGNOSIS — D509 Iron deficiency anemia, unspecified: Secondary | ICD-10-CM | POA: Diagnosis not present

## 2019-08-09 DIAGNOSIS — R195 Other fecal abnormalities: Secondary | ICD-10-CM | POA: Diagnosis not present

## 2019-08-09 DIAGNOSIS — G459 Transient cerebral ischemic attack, unspecified: Secondary | ICD-10-CM | POA: Diagnosis not present

## 2019-08-09 DIAGNOSIS — E039 Hypothyroidism, unspecified: Secondary | ICD-10-CM | POA: Insufficient documentation

## 2019-08-09 DIAGNOSIS — N179 Acute kidney failure, unspecified: Secondary | ICD-10-CM | POA: Insufficient documentation

## 2019-08-09 DIAGNOSIS — E875 Hyperkalemia: Secondary | ICD-10-CM | POA: Insufficient documentation

## 2019-08-09 LAB — CBC WITH DIFFERENTIAL/PLATELET
Abs Immature Granulocytes: 0.03 10*3/uL (ref 0.00–0.07)
Basophils Absolute: 0 10*3/uL (ref 0.0–0.1)
Basophils Relative: 0 %
Eosinophils Absolute: 0.1 10*3/uL (ref 0.0–0.5)
Eosinophils Relative: 1 %
HCT: 23.2 % — ABNORMAL LOW (ref 36.0–46.0)
Hemoglobin: 6.7 g/dL — CL (ref 12.0–15.0)
Immature Granulocytes: 0 %
Lymphocytes Relative: 16 %
Lymphs Abs: 1.3 10*3/uL (ref 0.7–4.0)
MCH: 29.4 pg (ref 26.0–34.0)
MCHC: 28.9 g/dL — ABNORMAL LOW (ref 30.0–36.0)
MCV: 101.8 fL — ABNORMAL HIGH (ref 80.0–100.0)
Monocytes Absolute: 1 10*3/uL (ref 0.1–1.0)
Monocytes Relative: 13 %
Neutro Abs: 5.3 10*3/uL (ref 1.7–7.7)
Neutrophils Relative %: 70 %
Platelets: 215 10*3/uL (ref 150–400)
RBC: 2.28 MIL/uL — ABNORMAL LOW (ref 3.87–5.11)
RDW: 15.4 % (ref 11.5–15.5)
WBC: 7.7 10*3/uL (ref 4.0–10.5)
nRBC: 0.5 % — ABNORMAL HIGH (ref 0.0–0.2)

## 2019-08-09 LAB — COMPREHENSIVE METABOLIC PANEL
ALT: 15 U/L (ref 0–44)
AST: 20 U/L (ref 15–41)
Albumin: 3.6 g/dL (ref 3.5–5.0)
Alkaline Phosphatase: 86 U/L (ref 38–126)
Anion gap: 8 (ref 5–15)
BUN: 27 mg/dL — ABNORMAL HIGH (ref 8–23)
CO2: 25 mmol/L (ref 22–32)
Calcium: 8.7 mg/dL — ABNORMAL LOW (ref 8.9–10.3)
Chloride: 107 mmol/L (ref 98–111)
Creatinine, Ser: 1.26 mg/dL — ABNORMAL HIGH (ref 0.44–1.00)
GFR calc Af Amer: 42 mL/min — ABNORMAL LOW (ref >60)
GFR calc non Af Amer: 36 mL/min — ABNORMAL LOW (ref >60)
Glucose, Bld: 125 mg/dL — ABNORMAL HIGH (ref 70–99)
Potassium: 5.3 mmol/L — ABNORMAL HIGH (ref 3.5–5.1)
Sodium: 140 mmol/L (ref 135–145)
Total Bilirubin: 0.4 mg/dL (ref 0.3–1.2)
Total Protein: 7 g/dL (ref 6.5–8.1)

## 2019-08-09 LAB — GLUCOSE, CAPILLARY: Glucose-Capillary: 134 mg/dL — ABNORMAL HIGH (ref 70–99)

## 2019-08-09 LAB — HEMOCCULT GUIAC POC 1CARD (OFFICE): Fecal Occult Blood, POC: POSITIVE — AB

## 2019-08-09 LAB — MRSA PCR SCREENING: MRSA by PCR: NEGATIVE

## 2019-08-09 LAB — PREPARE RBC (CROSSMATCH)

## 2019-08-09 LAB — SARS CORONAVIRUS 2 (TAT 6-24 HRS): SARS Coronavirus 2: NEGATIVE

## 2019-08-09 LAB — LIPASE, BLOOD: Lipase: 25 U/L (ref 11–51)

## 2019-08-09 MED ORDER — PANTOPRAZOLE SODIUM 40 MG PO TBEC
40.0000 mg | DELAYED_RELEASE_TABLET | Freq: Every day | ORAL | Status: DC
  Administered 2019-08-09 – 2019-08-10 (×2): 40 mg via ORAL
  Filled 2019-08-09 (×2): qty 1

## 2019-08-09 MED ORDER — AMLODIPINE BESYLATE 10 MG PO TABS
10.0000 mg | ORAL_TABLET | Freq: Every day | ORAL | Status: DC
  Administered 2019-08-10: 10 mg via ORAL
  Filled 2019-08-09: qty 1

## 2019-08-09 MED ORDER — ISOSORBIDE MONONITRATE ER 60 MG PO TB24
30.0000 mg | ORAL_TABLET | Freq: Every day | ORAL | Status: DC
  Administered 2019-08-10: 30 mg via ORAL
  Filled 2019-08-09: qty 1

## 2019-08-09 MED ORDER — INSULIN ASPART 100 UNIT/ML ~~LOC~~ SOLN
0.0000 [IU] | Freq: Three times a day (TID) | SUBCUTANEOUS | Status: DC
  Administered 2019-08-10: 1 [IU] via SUBCUTANEOUS

## 2019-08-09 MED ORDER — SORBITOL 70 % SOLN: 30.0000 mL | Freq: Every day | Status: DC | PRN

## 2019-08-09 MED ORDER — ALBUTEROL SULFATE (2.5 MG/3ML) 0.083% IN NEBU
3.0000 mL | INHALATION_SOLUTION | Freq: Four times a day (QID) | RESPIRATORY_TRACT | Status: DC | PRN
  Filled 2019-08-09: qty 3

## 2019-08-09 MED ORDER — ATORVASTATIN CALCIUM 40 MG PO TABS: 40.0000 mg | ORAL_TABLET | Freq: Every day | ORAL | Status: DC

## 2019-08-09 MED ORDER — SODIUM CHLORIDE 0.9% FLUSH
3.0000 mL | Freq: Two times a day (BID) | INTRAVENOUS | Status: DC
  Administered 2019-08-10: 3 mL via INTRAVENOUS

## 2019-08-09 MED ORDER — ALPRAZOLAM 0.25 MG PO TABS
0.2500 mg | ORAL_TABLET | Freq: Every evening | ORAL | Status: DC | PRN
  Administered 2019-08-09: 0.25 mg via ORAL
  Filled 2019-08-09: qty 1

## 2019-08-09 MED ORDER — METOPROLOL TARTRATE 25 MG PO TABS
50.0000 mg | ORAL_TABLET | Freq: Two times a day (BID) | ORAL | Status: DC
  Administered 2019-08-09 – 2019-08-10 (×2): 50 mg via ORAL
  Filled 2019-08-09 (×2): qty 2

## 2019-08-09 MED ORDER — ONDANSETRON HCL 4 MG PO TABS: 4.0000 mg | ORAL_TABLET | Freq: Four times a day (QID) | ORAL | Status: DC | PRN

## 2019-08-09 MED ORDER — SODIUM CHLORIDE 0.9 % IV BOLUS
500.0000 mL | Freq: Once | INTRAVENOUS | Status: AC
  Administered 2019-08-09: 500 mL via INTRAVENOUS

## 2019-08-09 MED ORDER — FLEET ENEMA 7-19 GM/118ML RE ENEM: 1.0000 | ENEMA | Freq: Once | RECTAL | Status: DC | PRN

## 2019-08-09 MED ORDER — SODIUM ZIRCONIUM CYCLOSILICATE 10 G PO PACK
10.0000 g | PACK | Freq: Once | ORAL | Status: AC
  Administered 2019-08-09: 10 g via ORAL
  Filled 2019-08-09: qty 1

## 2019-08-09 MED ORDER — INSULIN ASPART 100 UNIT/ML ~~LOC~~ SOLN: 0.0000 [IU] | Freq: Every day | SUBCUTANEOUS | Status: DC

## 2019-08-09 MED ORDER — ALBUTEROL SULFATE (2.5 MG/3ML) 0.083% IN NEBU
2.5000 mg | INHALATION_SOLUTION | Freq: Four times a day (QID) | RESPIRATORY_TRACT | Status: DC | PRN
  Administered 2019-08-09: 2.5 mg via RESPIRATORY_TRACT
  Filled 2019-08-09: qty 3

## 2019-08-09 MED ORDER — SENNA 8.6 MG PO TABS
1.0000 | ORAL_TABLET | Freq: Two times a day (BID) | ORAL | Status: DC
  Administered 2019-08-09 – 2019-08-10 (×2): 8.6 mg via ORAL
  Filled 2019-08-09 (×2): qty 1

## 2019-08-09 MED ORDER — ACETAMINOPHEN 650 MG RE SUPP: 650.0000 mg | Freq: Four times a day (QID) | RECTAL | Status: DC | PRN

## 2019-08-09 MED ORDER — ONDANSETRON HCL 4 MG/2ML IJ SOLN: 4.0000 mg | Freq: Four times a day (QID) | INTRAMUSCULAR | Status: DC | PRN

## 2019-08-09 MED ORDER — ALBUTEROL SULFATE HFA 108 (90 BASE) MCG/ACT IN AERS
1.0000 | INHALATION_SPRAY | Freq: Once | RESPIRATORY_TRACT | Status: AC
  Administered 2019-08-09: 2 via RESPIRATORY_TRACT
  Filled 2019-08-09: qty 6.7

## 2019-08-09 MED ORDER — BUDESONIDE 0.5 MG/2ML IN SUSP
0.5000 mg | Freq: Every day | RESPIRATORY_TRACT | Status: DC
  Administered 2019-08-10: 0.5 mg via RESPIRATORY_TRACT
  Filled 2019-08-09: qty 2

## 2019-08-09 MED ORDER — ACETAMINOPHEN 325 MG PO TABS: 650.0000 mg | ORAL_TABLET | Freq: Four times a day (QID) | ORAL | Status: DC | PRN

## 2019-08-09 MED ORDER — CHLORHEXIDINE GLUCONATE CLOTH 2 % EX PADS
6.0000 | MEDICATED_PAD | Freq: Every day | CUTANEOUS | Status: DC
  Administered 2019-08-10: 6 via TOPICAL

## 2019-08-09 MED ORDER — MAGNESIUM HYDROXIDE 400 MG/5ML PO SUSP: 30.0000 mL | Freq: Every day | ORAL | Status: DC | PRN

## 2019-08-09 MED ORDER — SODIUM CHLORIDE 0.9% IV SOLUTION: Freq: Once | INTRAVENOUS | Status: AC

## 2019-08-09 NOTE — ED Provider Notes (Addendum)
[facility] EMERGENCY DEPARTMENT Provider Note   CSN: [id number] Arrival date & time: 08/09/19  1224     History Chief Complaint  Patient presents with  . Blood In Stools    ARIANNY Skinner is a 86 y.o. female with history of memory loss, hypertension, hypothyroidism, GERD, CAD, CVA, diabetes, asthma, on Eliquis.  Patient sent in today from primary care doctor's office for concern of GI bleed, patient reports blood in the stool for greater than 1 week.  She was seen at PCPs office today and Hemoccult was performed and was positive.  She was sent to our ED for further evaluation.  Patient is here with her daughter today, daughter reports that patient has been having memory issues worsening over the past several months, no acute changes today.  Daughter reports that patient has been experiencing mild abdominal pain earlier this week which had resolved.  Additional concern today is of wheezing and shortness of breath with exertion, daughter reports is not new for the patient and they have been using inhalers with some relief.  Shortness of breath only occurs with exertion, resolves with rest.   She noticed patient had dark stools over the past few days.  No history of fever, cough, vomiting, complaints of chest pain, falls, weakness or any additional concerns. HPI     Past Medical History:  Diagnosis Date  . Anxiety 11/20/2011  . Asthma    UNDER THE CARE [facility] DR PAZ  . CAD (coronary artery disease)    s/p CABG s/p AO valve replacement ([place] [facility])  . CVA (cerebral infarction) 08-2013   multiple, L, d/t Afib, started eloquis  . Diabetes mellitus    type 2   . Dizziness    Chronic, admiet 07-2010,saw neuro, thought to be a peripheral issue   . Dry skin   . GERD (gastroesophageal reflux disease)   . Hyperlipidemia   . Hypertension   . Hyperthyroidism   . Keloid    @ chest  . Memory loss   . Osteoarthritis   . Osteopenia    per dexa 12/09  . Paroxysmal atrial  fibrillation ([place])    cards d/c coumadin 12/2008 d/t persisten NSR and frequent falls- restarted coumadin july 2011, now on Eliquis  . Recurrent UTI   . Shortness of breath dyspnea   . TIA (transient ischemic attack)     Patient Active Problem List   Diagnosis Date Noted  . Melena 08/09/2019  . Stroke-like symptom   . Hypokalemia 05/22/2019  . Chest pain 06/21/2017  . TIA (transient ischemic attack) 08/30/2016  . Acute kidney injury ([place]) 03/05/2016  . Exposure keratitis 09/23/2015  . Keratopathy, band 09/13/2015  . PCP NOTES >>>>> 04/05/2015  . Chronic diastolic heart failure ([place]) 12/29/2014  . Edema 12/20/2014  . Dyspnea 12/20/2014  . Angiectasia 03/30/2014  . GI bleed 03/28/2014  . Dementia ([place]) 09/16/2013  . Annual physical exam 11/21/2011  . ATRIAL FIBRILLATION, chronic 03/05/2007  . Diabetes mellitus with circulatory complication ([place]) [id number]  . S/P AVR (aortic valve replacement) 10/15/2006  . Hyperthyroidism 09/03/2006  . Dyslipidemia 09/03/2006  . HTN (hypertension) 09/03/2006  . CAD (coronary artery disease) of artery bypass graft 09/03/2006  . Asthma, chronic 09/03/2006    Past Surgical History:  Procedure Laterality Date  . ABDOMINAL HYSTERECTOMY    . AORTIC VALVE REPLACEMENT  1998  . CARDIAC VALVE REPLACEMENT    . CAROTID DOPPLER  07/13/12   BILATERAL BULB/PROXIMAL ICAS;MILD AMOUNT OF FIBROUS PLAQUE  WITH NO DIAMETER REDUCTION.  . CESAREAN SECTION     x 2  . COLONOSCOPY WITH PROPOFOL N/A 03/30/2014   Procedure: COLONOSCOPY WITH PROPOFOL;  Surgeon: Irene Shipper, MD;  Location: [facility];  Service: Endoscopy;  Laterality: N/A;  . CORONARY ARTERY BYPASS GRAFT  1998   SVG TO RCA  . HEMORRHOID SURGERY    . JOINT REPLACEMENT    . MYOCARDIAL PERFUSION STUDY  12/19/09   NORMAL PATTERN OF PERFUSION IN ALL REGIONS.EF 75%.  . OOPHORECTOMY    . TOTAL KNEE ARTHROPLASTY  1999  . TRANSTHORACIC ECHOCARDIOGRAM  09/11/11   LVEF >55%.STAGE 1 DIASTOLIC  DYSFUNCTION,ELEVATED LV FILLING PRESSURE.BIOPROSTHETIC AORTIC VALVE-PEAK AND MEAN GRADIENTS OF 17 MMHG AND 8 MMHG.SIGMOID SEPTUM.MILD TO MOD MR.MILD TO MOD TR.RVSP 46 MMHG.     OB History    Gravida  4   Para  4   Term      Preterm      AB      Living        SAB      TAB      Ectopic      Multiple      Live Births              Family History  Problem Relation Age of Onset  . Heart attack Father   . Coronary artery disease Son 35       deceased  . Hypertension Brother   . Stroke Brother   . Colon cancer Neg Hx   . Breast cancer Neg Hx   . Thyroid disease Neg Hx     Social History   Tobacco Use  . Smoking status: Former Research scientist (life sciences)  . Smokeless tobacco: Never Used  . Tobacco comment: quit 2004, smoked 1.5 ppd  Substance Use Topics  . Alcohol use: No    Alcohol/week: 0.0 standard drinks  . Drug use: No    Home Medications Prior to Admission medications   Medication Sig Start Date End Date Taking? Authorizing Provider  acetaminophen (TYLENOL) 500 MG tablet Take 1,000 mg by mouth every 6 (six) hours as needed (for pain).     [provider]  albuterol (ACCUNEB) 0.63 MG/3ML nebulizer solution Take 3 mLs by nebulization every 6 (six) hours as needed for wheezing or shortness of breath. 05/25/19   Colon Branch, MD  ALPRAZolam Duanne Moron) 0.25 MG tablet TAKE 1/2 TABLET OR TAKE 1 TABLET BY MOUTH AT NIGHT AS NEEDED FOR INSOMINIA Patient taking differently: Take 0.25-0.5 mg by mouth at bedtime as needed (for insomnia).  11/11/18   Colon Branch, MD  amLODipine (NORVASC) 10 MG tablet Take 1 tablet (10 mg total) by mouth daily. 12/08/18   Colon Branch, MD  apixaban (ELIQUIS) 5 MG TABS tablet Take 1 tablet (5 mg total) by mouth 2 (two) times daily. 05/31/19   Colon Branch, MD  atorvastatin (LIPITOR) 40 MG tablet TAKE 1 TABLET BY MOUTH EVERYDAY AT BEDTIME 06/18/19   Colon Branch, MD  benazepril (LOTENSIN) 40 MG tablet TAKE 1 TABLET BY MOUTH EVERY DAY 05/26/19   Colon Branch, MD    budesonide (PULMICORT) 0.5 MG/2ML nebulizer solution Take 2 mLs (0.5 mg total) by nebulization daily. 03/26/19   Colon Branch, MD  Calcium Carbonate-Vitamin D (CALCIUM 500 + D) 500-125 MG-UNIT TABS Take 1 tablet by mouth at bedtime.    [provider]  cloNIDine (CATAPRES) 0.1 MG tablet Take 1 tablet (0.1 mg total) by mouth  3 (three) times daily as needed. Patient not taking: Reported on 08/09/2019 07/09/19   Colon Branch, MD  esomeprazole (NEXIUM) 40 MG capsule Take 1 capsule (40 mg total) by mouth daily as needed. Patient taking differently: Take 40 mg by mouth daily as needed (for reflux symptoms).  01/19/18   Colon Branch, MD  furosemide (LASIX) 20 MG tablet Take 2 tablets (40 mg total) by mouth daily. 07/26/19   Colon Branch, MD  isosorbide mononitrate (IMDUR) 30 MG 24 hr tablet TAKE 1 TABLET BY MOUTH EVERY DAY Patient taking differently: Take 30 mg by mouth daily.  12/21/18   Troy Sine, MD  magnesium oxide (MAG-OX) 400 MG tablet Take 1 tablet (400 mg total) by mouth daily. 03/15/19   Colon Branch, MD  methimazole (TAPAZOLE) 5 MG tablet TAKE 1 TABLET BY MOUTH 3 TIMES A WEEK Patient taking differently: Take 5 mg by mouth every Monday, Wednesday, and Friday.  09/23/18   Colon Branch, MD  metoprolol tartrate (LOPRESSOR) 50 MG tablet Take 1 tablet (50 mg total) by mouth 2 (two) times daily. 07/09/19   Colon Branch, MD  nitroGLYCERIN (NITROSTAT) 0.4 MG SL tablet Place 1 tablet (0.4 mg total) under the tongue every 5 (five) minutes x 3 doses as needed for chest pain. Patient not taking: Reported on 08/09/2019 05/25/19   Colon Branch, MD  potassium chloride (KLOR-CON M10) 10 MEQ tablet Take 1 tablet (10 mEq total) by mouth daily. 07/09/19   Colon Branch, MD    Allergies    Hydrocodone and Tramadol hcl  Review of Systems   Review of Systems  Unable to perform ROS: Dementia   Physical Exam Updated Vital Signs BP (!) 136/111 (BP Location: Left Arm)   Pulse 67   Temp 97.7 F (36.5 C) (Oral)    Resp 16   Ht 5\' 2"  (1.575 m)   Wt 81.8 kg   SpO2 97% Comment: updating vitals:  02 level was 97%, pt did not have nasal cannula on nose and her sats remained at  97%  BMI 32.99 kg/m   Physical Exam Constitutional:      General: She is not in acute distress.    Appearance: Normal appearance. She is well-developed. She is not ill-appearing or diaphoretic.  HENT:     Head: Normocephalic and atraumatic.     Right Ear: External ear normal.     Left Ear: External ear normal.     Nose: Nose normal.  Eyes:     General: Vision grossly intact. Gaze aligned appropriately.     Pupils: Pupils are equal, round, and reactive to light.  Neck:     Trachea: Trachea and phonation normal. No tracheal deviation.  Cardiovascular:     Rate and Rhythm: Normal rate and regular rhythm.     Pulses: Normal pulses.     Heart sounds: Normal heart sounds.  Pulmonary:     Effort: Pulmonary effort is normal. No respiratory distress.     Breath sounds: Normal breath sounds.  Abdominal:     General: There is no distension.     Palpations: Abdomen is soft.     Tenderness: There is no abdominal tenderness. There is no guarding or rebound.  Musculoskeletal:        General: Normal range of motion.     Cervical back: Normal range of motion.  Skin:    General: Skin is warm and dry.  Neurological:     Mental  Status: She is alert.     GCS: GCS eye subscore is 4. GCS verbal subscore is 5. GCS motor subscore is 6.     Comments: Speech is clear and goal oriented, follows commands Major Cranial nerves without deficit, no facial droop Moves extremities without ataxia, coordination intact  Psychiatric:        Behavior: Behavior normal.     ED Results / Procedures / Treatments   Labs (all labs ordered are listed, but only abnormal results are displayed) Labs Reviewed  CBC WITH DIFFERENTIAL/PLATELET - Abnormal; Notable for the following components:      Result Value   RBC 2.28 (*)    Hemoglobin 6.7 (*)    HCT  23.2 (*)    MCV 101.8 (*)    MCHC 28.9 (*)    nRBC 0.5 (*)    All other components within normal limits  COMPREHENSIVE METABOLIC PANEL - Abnormal; Notable for the following components:   Potassium 5.3 (*)    Glucose, Bld 125 (*)    BUN 27 (*)    Creatinine, Ser 1.26 (*)    Calcium 8.7 (*)    GFR calc non Af Amer 36 (*)    GFR calc Af Amer 42 (*)    All other components within normal limits  SARS CORONAVIRUS 2 (TAT 6-24 HRS)  LIPASE, BLOOD  URINALYSIS, ROUTINE W REFLEX MICROSCOPIC    EKG EKG Interpretation  Date/Time:  Monday August 09 2019 13:13:42 EST Ventricular Rate:  75 PR Interval:    QRS Duration: 101 QT Interval:  435 QTC Calculation: 449 R Axis:   65 Text Interpretation: Atrial fibrillation Low voltage, precordial leads Nonspecific T abnormalities, lateral leads Baseline wander in lead(s) V3 similar to previous Confirmed by Theotis Burrow [id number]) on 08/09/2019 4:48:57 PM   Radiology DG Chest Portable 1 View  Result Date: 08/09/2019 CLINICAL DATA:  Shortness of breath EXAM: PORTABLE CHEST 1 VIEW COMPARISON:  04/13/2018 FINDINGS: Probable mild bibasilar atelectasis. No significant pleural effusion. No pneumothorax stable cardiomegaly. Evidence of prior CABG. Calcified plaque along the thoracic aorta. IMPRESSION: Mild bibasilar atelectasis. Electronically Signed   By: Macy Mis M.D.   On: 08/09/2019 14:35    Procedures .Critical Care Performed by: Deliah Boston, PA-C Authorized by: Deliah Boston, PA-C   Critical care provider statement:    Critical care time (minutes):  35   Critical care was necessary to treat or prevent imminent or life-threatening deterioration of the following conditions: Symptomatic anemia.   Critical care was time spent personally by me on the following activities:  Discussions with consultants, evaluation of patient's response to treatment, examination of patient, ordering and performing treatments and interventions, ordering and  review of laboratory studies, ordering and review of radiographic studies, pulse oximetry, re-evaluation of patient's condition, obtaining history from patient or surrogate, review of old charts and development of treatment plan with patient or surrogate   (including critical care time)  Medications Ordered in ED Medications  albuterol (VENTOLIN HFA) 108 (90 Base) MCG/ACT inhaler 1-2 puff (has no administration in time range)  sodium chloride 0.9 % bolus 500 mL (500 mLs Intravenous New Bag/Given 08/09/19 1528)  sodium zirconium cyclosilicate (LOKELMA) packet 10 g (10 g Oral Given 08/09/19 1535)    ED Course  I have reviewed the triage vital signs and the nursing notes.  Pertinent labs & imaging results that were available during my care of the patient were reviewed by me and considered in my medical decision making (see  chart for details).    MDM Rules/Calculators/A&P                     Chart review: Office visit today for dark stools.  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  Additionally showed diverticulosis.  Patient was found to have Hemoccult positive stool during office visit.  They consulted with GI who advised that patient would likely need admission.  I also reviewed note from gastroenterologist Dr. Tarri Glenn, it appears that they will provide inpatient consultation if admitted. - On arrival 86 year old female is tired appearing no acute distress vital signs stable.  She has history of dark stools for the past week, Hemoccult positive at PCPs today.  Cranial nerves intact, no meningeal signs, heart regular rate and rhythm, lungs clear, abdomen soft nontender without peritoneal signs.  As patient had Hemoccult performed at PCPs will not subject patient to repeat at this time.  Will obtain CBC, CMP, lipase,  urinalysis, EKG and chest x-ray.  Additionally patient has a history of shortness of breath with exertion, no associated chest pain.  She has a history of asthma which may be the etiology of the shortness of breath however there is concern that patient may be anemic today with cause of her symptoms. - CBC shows hemoglobin 6.7, no leukocytosis CMP shows mild elevation of creatinine/BUN and potassium Lipase and normal limits UA pending Chest x-ray:  IMPRESSION:  Mild bibasilar atelectasis.  - It appears that patient has significant anemia today, concerned that patient's exertional shortness of breath may be due to his symptomatic anemia.  Unfortunately we are unable to provide blood transfusion that is outlying ED, patient will need admission.  At 3:09 PM I spoke to gastroenterology APP Anderson Malta who has asked that we admit patient to [facility] long ED, no other recommendations at this time and will see patient in hospital for evaluation.  I then spoke to hospitalist Dr. Tyrell Antonio at 3:21 PM who has accepted patient to her service, she is asked that we provide patient nebulizer and has ordered [facility]. - 3:30 PM: Patient reevaluated she is resting comfortably and in no acute distress.  She is upset with the nurse and is requesting food.  She appears stable at this time.  No tachycardia or hypotension.  SPO2 98% on room air.  I have ordered patient albuterol inhaler, cannot provide nebulizer until Covid results.  I discussed plan of care with patient's daughter who states understanding they are agreeable for admission and blood transfusion.  Patient seen and evaluated by Dr. Sedonia Small during this visit who agrees with management and admission.  Note: Portions of this report may have been transcribed using voice recognition software. Every effort was made to ensure accuracy; however, inadvertent computerized transcription errors may still be present. Final Clinical Impression(s) / ED Diagnoses Final diagnoses:    Symptomatic anemia  Gastrointestinal hemorrhage, unspecified gastrointestinal hemorrhage type    Rx / DC Orders ED Discharge Orders    None       Deliah Boston, PA-C 08/09/19 1539    Gari Crown 08/09/19 1650    Maudie Flakes, MD 08/10/19 [phone]

## 2019-08-09 NOTE — Assessment & Plan Note (Signed)
GI bleed: Not feeling well for 1 week, had some abdominal discomfort that stopped with Pepto-Bismol but at this point she denies stomach pain although was a slightly TTP on exam. On chart review, she had a Colonoscopy 03-2014, found to have vascular malformations, had endoscopic hemostatic therapy.  Also diverticulosis. Her stools are dark and Hemoccult positive. She is on Eliquis.  Not orthostatic by blood pressures. Needs further evaluation.   Discussed with the ER doctor who accept the patient. Subsequently spoke w/ GI : given age and dark stools likely will need admission   Asthma: Has wheezing on and off, responding well to nebulizations.  Continue nebs for now. TIA: Saw neurology 06/29/2019, felt to be stable on anticoagulation.

## 2019-08-09 NOTE — Progress Notes (Signed)
Pre visit review using our clinic review tool, if applicable. No additional management support is needed unless otherwise documented below in the visit note. 

## 2019-08-09 NOTE — Telephone Encounter (Signed)
Thank you :)

## 2019-08-09 NOTE — Progress Notes (Signed)
Patient present with Melena, Hb 6.7. BP stable. Accepted to step down unit, Rebecca Skinner. For hyperkalemia , lokelma ordered. For wheezing Albuterol;. She will require Blood transfusion on arrival to [place].  Rebecca Hummer, MD.

## 2019-08-09 NOTE — Telephone Encounter (Signed)
Pt is checking in to [facility] ED. We will check her chart and follow up on need for GI consult.

## 2019-08-09 NOTE — ED Notes (Signed)
Attempted to call floor x 2 @ [phone] with no answer.

## 2019-08-09 NOTE — H&P (Addendum)
[facility] History and Physical  Rebecca Skinner [id number] DOB: 1922-09-04 DOA: 08/09/2019 PCP: Colon Branch, MD  Patient coming from: Home Chief Complaint: Black stool  History of Present Illness: Rebecca Skinner is a 86 y.o. female with history of memory loss, hypertension, hypothyroidism, GERD, CAD s/p CABG, CVA, diabetes, asthma, on Eliquis.   Patient was sent in today to the [place] from her PCP's office for concern of GI bleed.  Patient complains of dark stools for the last 1 week.  In 1 or 2 instances, she also noticed streaks of blood in the toilet paper.  It is associated with some shortness of breath on exertion not relieving with inhaler.  Hemoccult at PCPs office was positive. Sent to ED for further evaluation.   Patient and her daughter unaware of any GI bleeding in the past.  However I noticed otherwise in her PCPs office note from today.  Patient has a history of A. fib and used to be on Coumadin.  She had a stroke in 2015 after which Coumadin was switched to Eliquis.  Few months later, she had GI bleed because of which Eliquis was stopped and only a baby aspirin was started.  In 2018, patient had a TIA and hence aspirin was switched to Eliquis again.   No history of DVT or PE.  Had CABG in the past.  No stents Per family, patient also having memory issues progressively worsening for last several months. No fever, chest pain, mild abdominal pain.  No nausea, vomiting and diarrhea.  In the ED, patient was afebrile, hemodynamically stable.  Breathing comfortably in room air. Work-up showed sodium 140, potassium 5.3, creatinine 1.26, baseline creatinine normal WBC 7.7, hemoglobin 6.7, baseline globin more than 11.  MCV 102.  FOBT positive.  At the time of my evaluation, patient's daughter was at bedside.  Patient is alert, awake, oriented to place.  He has some memory issues at baseline.  Not in physical distress.  Review of Systems:  All systems were reviewed and  were negative unless otherwise mentioned in the HPI  Past medical history: Past Medical History:  Diagnosis Date  . Anxiety 11/20/2011  . Asthma    UNDER THE CARE [facility] DR PAZ  . CAD (coronary artery disease)    s/p CABG s/p AO valve replacement ([place] CV)  . CVA (cerebral infarction) 08-2013   multiple, L, d/t Afib, started eloquis  . Diabetes mellitus    type 2   . Dizziness    Chronic, admiet 07-2010,saw neuro, thought to be a peripheral issue   . Dry skin   . GERD (gastroesophageal reflux disease)   . Hyperlipidemia   . Hypertension   . Hyperthyroidism   . Keloid    @ chest  . Memory loss   . Osteoarthritis   . Osteopenia    per dexa 12/09  . Paroxysmal atrial fibrillation ([place])    cards d/c coumadin 12/2008 d/t persisten NSR and frequent falls- restarted coumadin july 2011, now on Eliquis  . Recurrent UTI   . Shortness of breath dyspnea   . TIA (transient ischemic attack)     Past surgical history: Past Surgical History:  Procedure Laterality Date  . ABDOMINAL HYSTERECTOMY    . AORTIC VALVE REPLACEMENT  1998  . CARDIAC VALVE REPLACEMENT    . CAROTID DOPPLER  07/13/12   BILATERAL BULB/PROXIMAL ICAS;MILD AMOUNT OF FIBROUS PLAQUE WITH NO DIAMETER REDUCTION.  . CESAREAN SECTION  x 2  . COLONOSCOPY WITH PROPOFOL N/A 03/30/2014   Procedure: COLONOSCOPY WITH PROPOFOL;  Surgeon: Irene Shipper, MD;  Location: [facility] ENDOSCOPY;  Service: Endoscopy;  Laterality: N/A;  . CORONARY ARTERY BYPASS GRAFT  1998   SVG TO RCA  . HEMORRHOID SURGERY    . JOINT REPLACEMENT    . MYOCARDIAL PERFUSION STUDY  12/19/09   NORMAL PATTERN OF PERFUSION IN ALL REGIONS.EF 75%.  . OOPHORECTOMY    . TOTAL KNEE ARTHROPLASTY  1999  . TRANSTHORACIC ECHOCARDIOGRAM  09/11/11   LVEF >55%.STAGE 1 DIASTOLIC DYSFUNCTION,ELEVATED LV FILLING PRESSURE.BIOPROSTHETIC AORTIC VALVE-PEAK AND MEAN GRADIENTS OF 17 MMHG AND 8 MMHG.SIGMOID SEPTUM.MILD TO MOD MR.MILD TO MOD TR.RVSP 46 MMHG.    Social History:   reports that she has quit smoking. She has never used smokeless tobacco. She reports that she does not drink alcohol or use drugs.  Allergies:  Allergies  Allergen Reactions  . Hydrocodone Itching and Nausea Only  . Tramadol Hcl Itching and Nausea Only    Family history:  Family History  Problem Relation Age of Onset  . Heart attack Father   . Coronary artery disease Son 17       deceased  . Hypertension Brother   . Stroke Brother   . Colon cancer Neg Hx   . Breast cancer Neg Hx   . Thyroid disease Neg Hx      Home Meds: Prior to Admission medications   Medication Sig Start Date End Date Taking? Authorizing Provider  acetaminophen (TYLENOL) 500 MG tablet Take 1,000 mg by mouth every 6 (six) hours as needed (for pain).     [provider]  albuterol (ACCUNEB) 0.63 MG/3ML nebulizer solution Take 3 mLs by nebulization every 6 (six) hours as needed for wheezing or shortness of breath. 05/25/19   Colon Branch, MD  ALPRAZolam Duanne Moron) 0.25 MG tablet TAKE 1/2 TABLET OR TAKE 1 TABLET BY MOUTH AT NIGHT AS NEEDED FOR INSOMINIA Patient taking differently: Take 0.25-0.5 mg by mouth at bedtime as needed (for insomnia).  11/11/18   Colon Branch, MD  amLODipine (NORVASC) 10 MG tablet Take 1 tablet (10 mg total) by mouth daily. 12/08/18   Colon Branch, MD  apixaban (ELIQUIS) 5 MG TABS tablet Take 1 tablet (5 mg total) by mouth 2 (two) times daily. 05/31/19   Colon Branch, MD  atorvastatin (LIPITOR) 40 MG tablet TAKE 1 TABLET BY MOUTH EVERYDAY AT BEDTIME 06/18/19   Colon Branch, MD  benazepril (LOTENSIN) 40 MG tablet TAKE 1 TABLET BY MOUTH EVERY DAY 05/26/19   Colon Branch, MD  budesonide (PULMICORT) 0.5 MG/2ML nebulizer solution Take 2 mLs (0.5 mg total) by nebulization daily. 03/26/19   Colon Branch, MD  Calcium Carbonate-Vitamin D (CALCIUM 500 + D) 500-125 MG-UNIT TABS Take 1 tablet by mouth at bedtime.    [provider]  cloNIDine (CATAPRES) 0.1 MG tablet Take 1 tablet (0.1 mg total) by  mouth 3 (three) times daily as needed. Patient not taking: Reported on 08/09/2019 07/09/19   Colon Branch, MD  esomeprazole (NEXIUM) 40 MG capsule Take 1 capsule (40 mg total) by mouth daily as needed. Patient taking differently: Take 40 mg by mouth daily as needed (for reflux symptoms).  01/19/18   Colon Branch, MD  furosemide (LASIX) 20 MG tablet Take 2 tablets (40 mg total) by mouth daily. 07/26/19   Colon Branch, MD  isosorbide mononitrate (IMDUR) 30 MG 24 hr tablet TAKE  1 TABLET BY MOUTH EVERY DAY Patient taking differently: Take 30 mg by mouth daily.  12/21/18   Troy Sine, MD  magnesium oxide (MAG-OX) 400 MG tablet Take 1 tablet (400 mg total) by mouth daily. 03/15/19   Colon Branch, MD  methimazole (TAPAZOLE) 5 MG tablet TAKE 1 TABLET BY MOUTH 3 TIMES A WEEK Patient taking differently: Take 5 mg by mouth every Monday, Wednesday, and Friday.  09/23/18   Colon Branch, MD  metoprolol tartrate (LOPRESSOR) 50 MG tablet Take 1 tablet (50 mg total) by mouth 2 (two) times daily. 07/09/19   Colon Branch, MD  nitroGLYCERIN (NITROSTAT) 0.4 MG SL tablet Place 1 tablet (0.4 mg total) under the tongue every 5 (five) minutes x 3 doses as needed for chest pain. Patient not taking: Reported on 08/09/2019 05/25/19   Colon Branch, MD  potassium chloride (KLOR-CON M10) 10 MEQ tablet Take 1 tablet (10 mEq total) by mouth daily. 07/09/19   Colon Branch, MD    Physical Exam: Vitals:   08/09/19 1338 08/09/19 1515 08/09/19 1549 08/09/19 1738  BP:  (!) 136/111  (!) 157/68  Pulse:  67  87  Resp:  16  18  Temp:    97.9 F (36.6 C)  TempSrc:    Oral  SpO2: 100% 97% 98% 100%  Weight:    84.9 kg  Height:    5\' 2"  (1.575 m)   Wt Readings from Last 3 Encounters:  08/09/19 84.9 kg  08/09/19 81.8 kg  06/29/19 80.9 kg   Body mass index is 34.23 kg/m.  General exam: Appears calm and comfortable.  Skin: No rashes, lesions or ulcers. HEENT: Atraumatic, normocephalic, supple neck, no obvious bleeding Lungs: Clear to  auscultate bilaterally CVS: Regular rate and rhythm, no murmur.  Midline incision from previous CABG GI/Abd soft, nondistended, nontender, bowel sound present CNS: Alert, awake, oriented.  Able to follow commands.  Psychiatry: Mood appropriate Extremities: No pedal edema, no calf tenderness     Consult Orders  (From admission, onward)         Start     Ordered   08/09/19 1511  Consult to hospitalist  ALL PATIENTS BEING ADMITTED/HAVING PROCEDURES NEED COVID-19 SCREENING\CALL MADE VIA [place], SPOKE WITH PHIL @ [phone]  Once    Comments: ALL PATIENTS BEING ADMITTED/HAVING PROCEDURES NEED COVID-19 SCREENING  Provider:  (Not yet assigned)  Question Answer Comment  Place call to: [facility]   Reason for Consult Admit      08/09/19 1510          Labs on Admission:   CBC: Recent Labs  Lab 08/09/19 1350  WBC 7.7  NEUTROABS 5.3  HGB 6.7*  HCT 23.2*  MCV 101.8*  PLT 123456    Basic Metabolic Panel: Recent Labs  Lab 08/09/19 1350  NA 140  K 5.3*  CL 107  CO2 25  GLUCOSE 125*  BUN 27*  CREATININE 1.26*  CALCIUM 8.7*    Liver Function Tests: Recent Labs  Lab 08/09/19 1350  AST 20  ALT 15  ALKPHOS 86  BILITOT 0.4  PROT 7.0  ALBUMIN 3.6   Recent Labs  Lab 08/09/19 1350  LIPASE 25   No results for input(s): AMMONIA in the last 168 hours.  Cardiac Enzymes: No results for input(s): CKTOTAL, CKMB, CKMBINDEX, TROPONINI in the last 168 hours.  BNP (last 3 results) Recent Labs    05/22/19 2200  BNP 698.3*    ProBNP (last 3 results)  No results for input(s): PROBNP in the last 8760 hours.  CBG: No results for input(s): GLUCAP in the last 168 hours.  Lipase     Component Value Date/Time   LIPASE 25 08/09/2019 1350     Urinalysis    Component Value Date/Time   COLORURINE YELLOW 05/23/2019 0131   APPEARANCEUR CLEAR 05/23/2019 0131   LABSPEC >1.046 (H) 05/23/2019 0131   PHURINE 6.0 05/23/2019 0131   GLUCOSEU NEGATIVE 05/23/2019 0131    GLUCOSEU NEGATIVE 01/30/2017 1229   HGBUR NEGATIVE 05/23/2019 0131   HGBUR negative 12/18/2009 0937   BILIRUBINUR NEGATIVE 05/23/2019 0131   BILIRUBINUR Neg 11/20/2011 1458   KETONESUR NEGATIVE 05/23/2019 0131   PROTEINUR 30 (A) 05/23/2019 0131   UROBILINOGEN 0.2 01/30/2017 1229   NITRITE NEGATIVE 05/23/2019 0131   LEUKOCYTESUR MODERATE (A) 05/23/2019 0131     Drugs of Abuse     Component Value Date/Time   LABOPIA NONE DETECTED 03/05/2016 1557   COCAINSCRNUR NONE DETECTED 03/05/2016 1557   LABBENZ NONE DETECTED 03/05/2016 1557   AMPHETMU NONE DETECTED 03/05/2016 1557   THCU NONE DETECTED 03/05/2016 1557   LABBARB NONE DETECTED 03/05/2016 1557      Radiological Exams on Admission: DG Chest Portable 1 View  Result Date: 08/09/2019 CLINICAL DATA:  Shortness of breath EXAM: PORTABLE CHEST 1 VIEW COMPARISON:  04/13/2018 FINDINGS: Probable mild bibasilar atelectasis. No significant pleural effusion. No pneumothorax stable cardiomegaly. Evidence of prior CABG. Calcified plaque along the thoracic aorta. IMPRESSION: Mild bibasilar atelectasis. Electronically Signed   By: Macy Mis M.D.   On: 08/09/2019 14:35     ------------------------------------------------------------------------------------------------------ Assessment/Plan: Active Problems:   GI bleed   Melena  Acute GI bleeding GERD -Likely lower. On Eliquis.  On chart review, it was noted that patient had GI bleeding in the past on Eliquis after which it was switched to baby aspirin but later after a TIA she was put on Eliquis again.  Here see comes with GI bleeding again. -Stop Eliquis.  May benefit from colonoscopy as an outpatient. -Continue to monitor. -Continue PPI for GERD  Acute blood loss anemia Macrocytic anemia -Chronically macrocytic.  However hemoglobin at baseline with more than 11 till 12/20. -Hemoglobin low at 6.7 on presentation.  1 unit PRBC transfusion ordered.  Repeat hemoglobin 12 after transfusion  is over.  Target more than 8 because of coexisting cardiovascular issues.  If less than 8, may benefit from second unit PRBC. -Order for iron profile, vitamin B12 and folate level.  AKI on CKD 3a -Creatinine 1.26, baseline less than 1 -Expect improvement with blood transfusion. -Keep Lasix, benazepril on hold.  Repeat BMP in the morning.  Hyperkalemia -Potassium 5.3, on the higher side of normal. -Patient is on Lasix and potassium supplement at home.  Probably with AKI, is starting to have hyperkalemia. -Stop potassium supplement.  Repeat BMP in the morning.  Expect to improve with improvement in creatinine.  Cardiovascular issues: HTN, CAD, CVA, history of A. fib, history of AVR -Home meds include metoprolol, amlodipine, benazepril, Lasix, nitrate, Eliquis, statin -Stop Eliquis.  Hold Lasix and benazepril because of AKI.  Continue amlodipine, nitrate. -On long-term, patient may benefit from just being on a baby aspirin at the best.  DM2 -Not controlled.  Continue SSI.  Hyperthyroidism -On methimazole.  Dementia -Supportive care.  Anxiety -On Xanax  DVT prophylaxis:  SCDs Antimicrobials:  None Fluid: None Diet: Cardiac diet  Code Status:  Full code Mobility: Encourage ambulation.  Walks with a walker at home Family  Communication:  Discussed with daughter at bedside Discharge plan:  Anticipated date: Anticipate discharge home tomorrow.  Consultants:  None  -------------------------------------------------------------------------------------------------------------------------- Severity of Illness: The appropriate patient status for this patient is OBSERVATION. Observation status is judged to be reasonable and necessary in order to provide the required intensity of service to ensure the patient's safety. The patient's presenting symptoms, physical exam findings, and initial radiographic and laboratory data in the context of their medical condition is felt to place them at  decreased risk for further clinical deterioration. Furthermore, it is anticipated that the patient will be medically stable for discharge from the hospital within 2 midnights of admission. The following factors support the patient status of observation.   " The patient's presenting symptoms include shortness of breath on exertion. " The physical exam findings include tiredness. " The initial radiographic and laboratory data are low hemoglobin, FOBT positive.  -----------------------------------------------------------------------------------------------------  Signed, Terrilee Croak, MD [facility] Pager: [phone] (Secure Chat preferred). 08/09/2019

## 2019-08-09 NOTE — Patient Instructions (Signed)
Please go to the emergency room.

## 2019-08-09 NOTE — Progress Notes (Signed)
Subjective:    Patient ID: Rebecca Skinner, female    DOB: 08-17-1922, 87 y.o.   MRN: [id number]  DOS:  08/09/2019 Type of visit - description: Acute, here with her daughter. The patient has advanced dementia, information is mostly obtained from the daughter.  Not feeling well for at least 1 week, last week she complained of mid abdominal discomfort, got a couple of doses of Pepto-Bismol and subsequently felt better. Since then stools were noted to be dark.  Also, continue with on and off wheezing, shortness of breath. Got nebulizations 3 times yesterday and now she seems to be better.  Ambulatory BPs normal   Review of Systems No fever chills Appetite is decreased No nausea, vomiting, diarrhea.  No red blood per rectum. No GERD problems. Occasionally sees red blood only with on the tissue paper. Cough is minimal with minimal sputum production. Denies orthostatic dizziness or feeling weak otherwise.  Past Medical History:  Diagnosis Date  . Anxiety 11/20/2011  . Asthma    UNDER [facility] DR Tukker Byrns  . CAD (coronary artery disease)    s/p CABG s/p AO valve replacement ([place] CV)  . CVA (cerebral infarction) 08-2013   multiple, L, d/t Afib, started eloquis  . Diabetes mellitus    type 2   . Dizziness    Chronic, admiet 07-2010,saw neuro, thought to be a peripheral issue   . Dry skin   . GERD (gastroesophageal reflux disease)   . Hyperlipidemia   . Hypertension   . Hyperthyroidism   . Keloid    @ chest  . Memory loss   . Osteoarthritis   . Osteopenia    per dexa 12/09  . Paroxysmal atrial fibrillation ([place])    cards d/c coumadin 12/2008 d/t persisten NSR and frequent falls- restarted coumadin july 2011, now on Eliquis  . Recurrent UTI   . Shortness of breath dyspnea   . TIA (transient ischemic attack)     Past Surgical History:  Procedure Laterality Date  . ABDOMINAL HYSTERECTOMY    . AORTIC VALVE REPLACEMENT  1998  . CARDIAC VALVE REPLACEMENT    . CAROTID  DOPPLER  07/13/12   BILATERAL BULB/PROXIMAL ICAS;MILD AMOUNT OF FIBROUS PLAQUE WITH NO DIAMETER REDUCTION.  . CESAREAN SECTION     x 2  . COLONOSCOPY WITH PROPOFOL N/A 03/30/2014   Procedure: COLONOSCOPY WITH PROPOFOL;  Surgeon: Irene Shipper, MD;  Location: [facility];  Service: Endoscopy;  Laterality: N/A;  . CORONARY ARTERY BYPASS GRAFT  1998   SVG TO RCA  . HEMORRHOID SURGERY    . JOINT REPLACEMENT    . MYOCARDIAL PERFUSION STUDY  12/19/09   NORMAL PATTERN OF PERFUSION IN ALL REGIONS.EF 75%.  . OOPHORECTOMY    . TOTAL KNEE ARTHROPLASTY  1999  . TRANSTHORACIC ECHOCARDIOGRAM  09/11/11   LVEF >55%.STAGE 1 DIASTOLIC DYSFUNCTION,ELEVATED LV FILLING PRESSURE.BIOPROSTHETIC AORTIC VALVE-PEAK AND MEAN GRADIENTS OF 17 MMHG AND 8 MMHG.SIGMOID SEPTUM.MILD TO MOD MR.MILD TO MOD TR.RVSP 46 MMHG.    Allergies as of 08/09/2019      Reactions   Hydrocodone Itching, Nausea Only   Tramadol Hcl Itching, Nausea Only      Medication List       Accurate as of August 09, 2019 11:31 AM. If you have any questions, ask your nurse or doctor.        STOP taking these medications   CALCIUM 500 PO Stopped by: Kathlene November, MD     TAKE these medications  acetaminophen 500 MG tablet Commonly known as: TYLENOL Take 1,000 mg by mouth every 6 (six) hours as needed (for pain).   albuterol 0.63 MG/3ML nebulizer solution Commonly known as: ACCUNEB Take 3 mLs by nebulization every 6 (six) hours as needed for wheezing or shortness of breath.   ALPRAZolam 0.25 MG tablet Commonly known as: XANAX TAKE 1/2 TABLET OR TAKE 1 TABLET BY MOUTH AT NIGHT AS NEEDED FOR INSOMINIA What changed: See the new instructions.   amLODipine 10 MG tablet Commonly known as: NORVASC Take 1 tablet (10 mg total) by mouth daily.   apixaban 5 MG Tabs tablet Commonly known as: Eliquis Take 1 tablet (5 mg total) by mouth 2 (two) times daily.   atorvastatin 40 MG tablet Commonly known as: LIPITOR TAKE 1 TABLET BY MOUTH EVERYDAY AT  BEDTIME   benazepril 40 MG tablet Commonly known as: LOTENSIN TAKE 1 TABLET BY MOUTH EVERY DAY   budesonide 0.5 MG/2ML nebulizer solution Commonly known as: PULMICORT Take 2 mLs (0.5 mg total) by nebulization daily.   Calcium 500 + D 500-125 MG-UNIT Tabs Generic drug: Calcium Carbonate-Vitamin D Take 1 tablet by mouth at bedtime.   cloNIDine 0.1 MG tablet Commonly known as: CATAPRES Take 1 tablet (0.1 mg total) by mouth 3 (three) times daily as needed.   esomeprazole 40 MG capsule Commonly known as: NEXIUM Take 1 capsule (40 mg total) by mouth daily as needed. What changed: reasons to take this   furosemide 20 MG tablet Commonly known as: LASIX Take 2 tablets (40 mg total) by mouth daily.   isosorbide mononitrate 30 MG 24 hr tablet Commonly known as: IMDUR TAKE 1 TABLET BY MOUTH EVERY DAY   magnesium oxide 400 MG tablet Commonly known as: MAG-OX Take 1 tablet (400 mg total) by mouth daily.   methimazole 5 MG tablet Commonly known as: TAPAZOLE TAKE 1 TABLET BY MOUTH 3 TIMES A WEEK What changed: See the new instructions.   metoprolol tartrate 50 MG tablet Commonly known as: LOPRESSOR Take 1 tablet (50 mg total) by mouth 2 (two) times daily.   nitroGLYCERIN 0.4 MG SL tablet Commonly known as: NITROSTAT Place 1 tablet (0.4 mg total) under the tongue every 5 (five) minutes x 3 doses as needed for chest pain.   potassium chloride 10 MEQ tablet Commonly known as: [place]-Con M10 Take 1 tablet (10 mEq total) by mouth daily.   predniSONE 10 MG tablet Commonly known as: DELTASONE 2 tabs x 5 days, 1 tab x 5 days             Objective:   Physical Exam BP (!) 113/48 (BP Location: Left Arm, Patient Position: Sitting, Cuff Size: Small)   Pulse 64   Temp (!) 95.4 F (35.2 C) (Temporal)   Resp 16   Ht 5\' 3"  (1.6 m)   Wt 180 lb 6 oz (81.8 kg)   SpO2 99%   BMI 31.95 kg/m  BP standing: 112/50 P 61  General:   Well developed, lady, sitting in a wheelchair, in no  distress. HEENT:  Normocephalic . Face symmetric, atraumatic Lungs:  Decreased breath sounds, end expiratory wheezing noted, no crackles. Normal respiratory effort, no intercostal retractions, no accessory muscle use. Heart: Irregularly irregular.  Abdomen:  Exam is performed while the patient is sitting, not distended, soft, mild tenderness noted without mass or rebound at the right and left side of the abdomen. DRE: Normal sphincter tone, dark stools, not tarry, + Hemoccult. Skin: Not pale. Not jaundice Lower  extremities: no pretibial edema bilaterally  Neurologic:  alert & pleasantly demented, cooperative Speech normal, gait not tested Psych: Behavior appropriate. No anxious or depressed appearing.     Assessment    Assessment DM HTN Hyperlipidemia Hyperthyroidism -- used to see Dr. Loanne Drilling, last visit 01-2016, now f/u PCP, check labs x 2 q year GERD Asthma  Anxiety on xanax  CV: Dr. Claiborne Billings --CHF, CAD, CABG, Aortic valve replacement --P- atrial fibrillation, used to be on Coumadin, Eliquis rx after the stroke 3-15, d/c 03-2014 d/t GI bleed , on ASA 81. Dx TIA 08-2016, back on eliquis, asa d/c.   NEURO --TIAs (admited 08-2016), CVA 2015 --Dementia MMSE ~ 12 to 15  (12-2016) DJD Osteopenia 2009 Recurrent UTIs Daughter  Nemesis Kovalsky; lives w/ [name]  PLAN GI bleed: Not feeling well for 1 week, had some abdominal discomfort that stopped with Pepto-Bismol but at this point she denies stomach pain although was a slightly TTP on exam. On chart review, she had a Colonoscopy 03-2014, found to have vascular malformations, had endoscopic hemostatic therapy.  Also diverticulosis. Her stools are dark and Hemoccult positive. She is on Eliquis.  Not orthostatic by blood pressures. Needs further evaluation.   Discussed with the ER doctor who accept the patient. Subsequently spoke w/ GI : given age and dark stools likely will need admission   Asthma: Has wheezing on and off, responding well  to nebulizations.  Continue nebs for now. TIA: Saw neurology 06/29/2019, felt to be stable on anticoagulation.     This visit occurred during the SARS-CoV-2 public health emergency.  Safety protocols were in place, including screening questions prior to the visit, additional usage of staff PPE, and extensive cleaning of exam room while observing appropriate contact time as indicated for disinfecting solutions.

## 2019-08-09 NOTE — Telephone Encounter (Signed)
I spoke with Dr. Larose Kells. This patient is on the way to the ER. He anticipates that she will be admitted if her hemoglobin is low and was hoping that we would provide inpatient consultation at that time. Thank you.

## 2019-08-09 NOTE — ED Triage Notes (Signed)
Pt arrives to ED with daughter to ED. Family reports pt has been having dark stool, PCP upstairs reports she did have blood in stool and sent pt to ed for evaluation. Pt does take Eliquis.

## 2019-08-09 NOTE — Progress Notes (Signed)
Pt blood transfusion has started.

## 2019-08-09 NOTE — Progress Notes (Signed)
Pt has shown no s/s of distress at this time will continue to monitor.

## 2019-08-09 NOTE — ED Notes (Signed)
Called x2 more times with no answer, only number available.   Secretary Val called main [facility] @ [phone] and was told that is the only # for floor.   [facility] en route, will continue to try call report.

## 2019-08-10 ENCOUNTER — Other Ambulatory Visit: Payer: Self-pay | Admitting: *Deleted

## 2019-08-10 ENCOUNTER — Ambulatory Visit: Payer: Medicare Other | Admitting: Cardiovascular Disease

## 2019-08-10 DIAGNOSIS — Z7901 Long term (current) use of anticoagulants: Secondary | ICD-10-CM

## 2019-08-10 DIAGNOSIS — R195 Other fecal abnormalities: Secondary | ICD-10-CM

## 2019-08-10 DIAGNOSIS — D500 Iron deficiency anemia secondary to blood loss (chronic): Secondary | ICD-10-CM | POA: Diagnosis not present

## 2019-08-10 DIAGNOSIS — K921 Melena: Secondary | ICD-10-CM | POA: Diagnosis not present

## 2019-08-10 LAB — CBC WITH DIFFERENTIAL/PLATELET
Abs Immature Granulocytes: 0.05 10*3/uL (ref 0.00–0.07)
Basophils Absolute: 0 10*3/uL (ref 0.0–0.1)
Basophils Relative: 1 %
Eosinophils Absolute: 0.1 10*3/uL (ref 0.0–0.5)
Eosinophils Relative: 1 %
HCT: 25.1 % — ABNORMAL LOW (ref 36.0–46.0)
Hemoglobin: 7.5 g/dL — ABNORMAL LOW (ref 12.0–15.0)
Immature Granulocytes: 1 %
Lymphocytes Relative: 10 %
Lymphs Abs: 0.8 10*3/uL (ref 0.7–4.0)
MCH: 29.9 pg (ref 26.0–34.0)
MCHC: 29.9 g/dL — ABNORMAL LOW (ref 30.0–36.0)
MCV: 100 fL (ref 80.0–100.0)
Monocytes Absolute: 1.1 10*3/uL — ABNORMAL HIGH (ref 0.1–1.0)
Monocytes Relative: 13 %
Neutro Abs: 6.2 10*3/uL (ref 1.7–7.7)
Neutrophils Relative %: 74 %
Platelets: 173 10*3/uL (ref 150–400)
RBC: 2.51 MIL/uL — ABNORMAL LOW (ref 3.87–5.11)
RDW: 15.5 % (ref 11.5–15.5)
WBC: 8.3 10*3/uL (ref 4.0–10.5)
nRBC: 0.4 % — ABNORMAL HIGH (ref 0.0–0.2)

## 2019-08-10 LAB — FOLATE: Folate: 6.8 ng/mL (ref >5.9)

## 2019-08-10 LAB — IRON AND TIBC
Iron: 28 ug/dL (ref 28–170)
Saturation Ratios: 5 % — ABNORMAL LOW (ref 10.4–31.8)
TIBC: 556 ug/dL — ABNORMAL HIGH (ref 250–450)
UIBC: 528 ug/dL

## 2019-08-10 LAB — TYPE AND SCREEN
ABO/RH(D): O POS
Antibody Screen: NEGATIVE
Unit division: 0

## 2019-08-10 LAB — BPAM RBC
Blood Product Expiration Date: 202104032359
ISSUE DATE / TIME: 202103012050
Unit Type and Rh: 5100

## 2019-08-10 LAB — GLUCOSE, CAPILLARY
Glucose-Capillary: 134 mg/dL — ABNORMAL HIGH (ref 70–99)
Glucose-Capillary: 105 mg/dL — ABNORMAL HIGH (ref 70–99)

## 2019-08-10 LAB — BASIC METABOLIC PANEL
Anion gap: 8 (ref 5–15)
BUN: 31 mg/dL — ABNORMAL HIGH (ref 8–23)
CO2: 26 mmol/L (ref 22–32)
Calcium: 8.6 mg/dL — ABNORMAL LOW (ref 8.9–10.3)
Chloride: 107 mmol/L (ref 98–111)
Creatinine, Ser: 1.3 mg/dL — ABNORMAL HIGH (ref 0.44–1.00)
GFR calc Af Amer: 41 mL/min — ABNORMAL LOW (ref >60)
GFR calc non Af Amer: 35 mL/min — ABNORMAL LOW (ref >60)
Glucose, Bld: 123 mg/dL — ABNORMAL HIGH (ref 70–99)
Potassium: 4.7 mmol/L (ref 3.5–5.1)
Sodium: 141 mmol/L (ref 135–145)

## 2019-08-10 LAB — CBC
HCT: 26.6 % — ABNORMAL LOW (ref 36.0–46.0)
Hemoglobin: 8.1 g/dL — ABNORMAL LOW (ref 12.0–15.0)
MCH: 30.1 pg (ref 26.0–34.0)
MCHC: 30.5 g/dL (ref 30.0–36.0)
MCV: 98.9 fL (ref 80.0–100.0)
Platelets: 187 10*3/uL (ref 150–400)
RBC: 2.69 MIL/uL — ABNORMAL LOW (ref 3.87–5.11)
RDW: 15.8 % — ABNORMAL HIGH (ref 11.5–15.5)
WBC: 8.8 10*3/uL (ref 4.0–10.5)
nRBC: 0.5 % — ABNORMAL HIGH (ref 0.0–0.2)

## 2019-08-10 LAB — VITAMIN B12: Vitamin B-12: 568 pg/mL (ref 180–914)

## 2019-08-10 LAB — FERRITIN: Ferritin: 12 ng/mL (ref 11–307)

## 2019-08-10 LAB — HEMOGLOBIN A1C
Hgb A1c MFr Bld: 5.9 % — ABNORMAL HIGH (ref 4.8–5.6)
Mean Plasma Glucose: 122.63 mg/dL

## 2019-08-10 MED ORDER — HALOPERIDOL LACTATE 5 MG/ML IJ SOLN
1.0000 mg | Freq: Once | INTRAMUSCULAR | Status: AC | PRN
  Administered 2019-08-10: 1 mg via INTRAVENOUS
  Filled 2019-08-10: qty 1

## 2019-08-10 MED ORDER — FUROSEMIDE 20 MG PO TABS: 40.0000 mg | ORAL_TABLET | Freq: Every day | ORAL | 1 refills | Status: DC | PRN

## 2019-08-10 MED ORDER — HALOPERIDOL LACTATE 5 MG/ML IJ SOLN
1.0000 mg | Freq: Once | INTRAMUSCULAR | Status: AC
  Administered 2019-08-10: 1 mg via INTRAVENOUS
  Filled 2019-08-10: qty 1

## 2019-08-10 NOTE — Patient Outreach (Addendum)
[facility]) Care Management  08/10/2019  Rebecca Skinner Nov 12, 1922 [id number]   RN Health Coach Quarterly Outreach  Referral Date:12/03/2018 Referral Source:[employer] High Risk Screening Reason for Referral:Disease Management Education Insurance:[employer] Medicare   Outreach Attempt:  Received notification patient admitted to [facility] for [facility].  RN Health Coach will notify [facility] Liaison of patient's admission.  Plan:  RN Health Coach will await Hospital Liaisons recommendations for discharge follow up.  [place] [phone] [email or web address]

## 2019-08-10 NOTE — Progress Notes (Signed)
Pt remains restless. RN gave xanax with no relief paged Baltazar Najjar awaiting new orders if any.

## 2019-08-10 NOTE — Plan of Care (Signed)
Pt alert and oriented x 2 able to express needs denies pain. Pt to have procedure this am was NPO. GI decided no EGD On room air with no acute distress noted. Incontinent of bowel and bladder purwick in reach mobility assist x1 when out of bed. Pt to discharge today. Call bell within reach skid resistant socks in place bed in low position will continue to monitor

## 2019-08-10 NOTE — Discharge Summary (Addendum)
Physician Discharge Summary  SKILYNN MOIST [id number] DOB: 1923/01/25 DOA: 08/09/2019  PCP: Colon Branch, MD  Admit date: 08/09/2019 Discharge date: 08/10/2019  Admitted From: Home Discharge disposition: home   Code Status: Full Code  Diet Recommendation: cardiac  Recommendations for Outpatient Follow-Up:   1. Follow-up with GI as an outpatient.  Discharge Diagnosis:   Active Problems:   GI bleed   Melena  History of Present Illness / Brief narrative:  Rebecca Skinner is a 86 y.o. female with history of memory loss, hypertension, hypothyroidism, GERD, CAD s/p CABG, CVA, diabetes, asthma, on Eliquis.   Patient was sent in today to the [facility] from her PCP's office for concern of GI bleed.  Patient complains of dark stools for the last 1 week.  In 1 or 2 instances, she also noticed streaks of blood in the toilet paper.  It is associated with some shortness of breath on exertion not relieving with inhaler.  Hemoccult at PCPs office was positive. Sent to ED for further evaluation.   Patient and her daughter unaware of any GI bleeding in the past.  However I noticed otherwise in her PCPs office note from today.  Patient has a history of A. fib and used to be on Coumadin.  She had a stroke in 2015 after which Coumadin was switched to Eliquis.  Few months later, she had GI bleed because of which Eliquis was stopped and only a baby aspirin was started.  In 2018, patient had a TIA and hence aspirin was switched to Eliquis again.   No history of DVT or PE.  Had CABG in the past.  No stents Per family, patient also having memory issues progressively worsening for last several months. No fever, chest pain, mild abdominal pain.  No nausea, vomiting and diarrhea.  In the ED, patient was afebrile, hemodynamically stable.  Breathing comfortably in room air. Work-up showed sodium 140, potassium 5.3, creatinine 1.26, baseline creatinine normal WBC 7.7, hemoglobin 6.7, baseline globin more than  11.  MCV 102.  FOBT positive.  At the time of my evaluation, patient's daughter was at bedside.  Patient is alert, awake, oriented to place.  He has some memory issues at baseline.  Not in physical distress.   Hospital Course:  Acute GI bleeding GERD -Likely lower. On Eliquis.  On chart review, it was noted that patient had GI bleeding in the past on Eliquis after which it was switched to baby aspirin but later after a TIA she was put on Eliquis again.  Here she comes with GI bleeding again. -I think the right thing to do at this time would be to stop Eliquis.   -GI consult appreciated.  Continue to monitor as an outpatient.  Follow-up in office as needed. -Continue PPI for GERD  Acute blood loss anemia Macrocytic anemia -Chronically macrocytic. However hemoglobin at baseline with more than 11 till 12/20. -Hemoglobin low at 6.7 on presentation. 1 unit PRBC transfused.  Hemoglobin improved to 8.1 this morning.  Only panel shows ferritin level low at 12, TIBC elevated.  Vitamin B12 568, folate level normal at 6.8. -Noted iron deficiency probably secondary to chronic bleeding.  -Start on ferrous sulfate orally.  AKI on CKD 3a -Creatinine 1.26 on admission, baseline less than 1 -Remains elevated this morning. -Currently Lasix, benazepril on hold.  Patient's blood pressure remains in normal range without Lasix and benazepril.  I will stop benazepril and resume Lasix as as needed. At the  time of discharge today, patient had a brief episode of wheezing which improved with inhaler. Patient waited for an hour and improved. I spoke with the daughter to resume Lasix at home PRN if dyspnea recurs or if she starts having leg swelling or >3 lb wt gain in 24 hrs.   Cardiovascular issues: HTN, CAD, CVA, history of A. fib, history of AVR -Home meds include metoprolol, amlodipine, benazepril, Lasix, nitrate, Eliquis, statin -Hold benazepril.  Change Lasix to as needed.  Continue amlodipine, nitrate.  -Stop Eliquis.  On long-term, patient may benefit from just being on a baby aspirin at the best.  DM2 -Not on medicines. Continue SSI.  Hyperthyroidism -On methimazole.  Dementia -Supportive care.  Anxiety -On Xanax  Stable for discharge to home today.  Discussed with patient's daughter at bedside.  Subjective:  Seen and examined this morning.  Pleasant elderly African-American female.  Slightly confused this morning.  No active bleeding noted.  Discharge Exam:   Vitals:   08/10/19 0900 08/10/19 1000 08/10/19 1100 08/10/19 1200  BP:      Pulse: 73 (!) 107 93 83  Resp: 19 (!) 27 (!) 21 19  Temp:      TempSrc:      SpO2: 98% 98% 93% 97%  Weight:      Height:        Body mass index is 34.23 kg/m.  General exam: Appears calm and comfortable. Skin: No rashes, lesions or ulcers. HEENT: Atraumatic, normocephalic, supple neck, no obvious bleeding Lungs: Clear to auscultation bilaterally CVS: Regular rate and rhythm, no murmur GI/Abd soft, nontender, nondistended, bowel sound present CNS: Alert, awake, confused, intermittent restlessness Psychiatry: Depressed look Extremities: No pedal edema, no calf tenderness  Discharge Instructions:  Wound care: None Discharge Instructions    Diet - low sodium heart healthy   Complete by: As directed    Increase activity slowly   Complete by: As directed    Increase activity slowly   Complete by: As directed      Follow-up Information    Colon Branch, MD Follow up.   Specialty: Internal Medicine Contact information: [address] [place] [address] [phone]        Troy Sine, MD .   Specialty: Cardiology Contact information: [address] [place] [address] [phone]        Irene Shipper, MD. Schedule an appointment as soon as possible for a visit.   Specialty: Gastroenterology Contact information: [address] [place] [address] [phone]           Allergies as of 08/10/2019      Reactions   Hydrocodone Itching, Nausea Only   Tramadol Hcl Itching, Nausea Only      Medication List    STOP taking these medications   apixaban 5 MG Tabs tablet Commonly known as: Eliquis   benazepril 40 MG tablet Commonly known as: LOTENSIN   cloNIDine 0.1 MG tablet Commonly known as: CATAPRES     TAKE these medications   acetaminophen 500 MG tablet Commonly known as: TYLENOL Take 1,000 mg by mouth every 6 (six) hours as needed (for pain).   albuterol 0.63 MG/3ML nebulizer solution Commonly known as: ACCUNEB Take 3 mLs by nebulization every 6 (six) hours as needed for wheezing or shortness of breath.   ALPRAZolam 0.25 MG tablet Commonly known as: XANAX TAKE 1/2 TABLET OR TAKE 1 TABLET BY MOUTH AT NIGHT AS NEEDED FOR INSOMINIA What  changed: See the new instructions.   amLODipine 10 MG tablet Commonly known as: NORVASC Take 1 tablet (10 mg total) by mouth daily.   atorvastatin 40 MG tablet Commonly known as: LIPITOR TAKE 1 TABLET BY MOUTH EVERYDAY AT BEDTIME What changed: See the new instructions.   bismuth subsalicylate 99991111 MG chewable tablet Commonly known as: PEPTO BISMOL Chew 524 mg by mouth as needed for indigestion or diarrhea or loose stools.   budesonide 0.5 MG/2ML nebulizer solution Commonly known as: PULMICORT Take 2 mLs (0.5 mg total) by nebulization daily.   Calcium 500 + D 500-125 MG-UNIT Tabs Generic drug: Calcium Carbonate-Vitamin D Take 1 tablet by mouth at bedtime.   esomeprazole 40 MG capsule Commonly known as: NEXIUM Take 1 capsule (40 mg total) by mouth daily as needed. What changed: reasons to take this   furosemide 20 MG tablet Commonly known as: LASIX Take 2 tablets (40 mg total) by mouth daily as needed. What changed:   when to take this  reasons to take this   isosorbide mononitrate 30 MG 24 hr tablet Commonly known as: IMDUR TAKE 1 TABLET BY MOUTH EVERY DAY   magnesium oxide 400 MG  tablet Commonly known as: MAG-OX Take 1 tablet (400 mg total) by mouth daily.   methimazole 5 MG tablet Commonly known as: TAPAZOLE TAKE 1 TABLET BY MOUTH 3 TIMES A WEEK What changed: See the new instructions.   metoprolol tartrate 50 MG tablet Commonly known as: LOPRESSOR Take 1 tablet (50 mg total) by mouth 2 (two) times daily.   nitroGLYCERIN 0.4 MG SL tablet Commonly known as: NITROSTAT Place 1 tablet (0.4 mg total) under the tongue every 5 (five) minutes x 3 doses as needed for chest pain.   potassium chloride 10 MEQ tablet Commonly known as: Klor-Con M10 Take 1 tablet (10 mEq total) by mouth daily.       Time coordinating discharge: 25 minutes  The results of significant diagnostics from this hospitalization (including imaging, microbiology, ancillary and laboratory) are listed below for reference.    Procedures and Diagnostic Studies:   DG Chest Portable 1 View  Result Date: 08/09/2019 CLINICAL DATA:  Shortness of breath EXAM: PORTABLE CHEST 1 VIEW COMPARISON:  04/13/2018 FINDINGS: Probable mild bibasilar atelectasis. No significant pleural effusion. No pneumothorax stable cardiomegaly. Evidence of prior CABG. Calcified plaque along the thoracic aorta. IMPRESSION: Mild bibasilar atelectasis. Electronically Signed   By: Macy Mis M.D.   On: 08/09/2019 14:35     Labs:   Basic Metabolic Panel: Recent Labs  Lab 08/09/19 1350 08/10/19 0131  NA 140 141  K 5.3* 4.7  CL 107 107  CO2 25 26  GLUCOSE 125* 123*  BUN 27* 31*  CREATININE 1.26* 1.30*  CALCIUM 8.7* 8.6*   GFR Estimated Creatinine Clearance: 26.7 mL/min (A) (by C-G formula based on SCr of 1.3 mg/dL (H)). Liver Function Tests: Recent Labs  Lab 08/09/19 1350  AST 20  ALT 15  ALKPHOS 86  BILITOT 0.4  PROT 7.0  ALBUMIN 3.6   Recent Labs  Lab 08/09/19 1350  LIPASE 25   No results for input(s): AMMONIA in the last 168 hours. Coagulation profile No results for input(s): INR, PROTIME in the  last 168 hours.  CBC: Recent Labs  Lab 08/09/19 1350 08/10/19 0131 08/10/19 0546  WBC 7.7 8.3 8.8  NEUTROABS 5.3 6.2  --   HGB 6.7* 7.5* 8.1*  HCT 23.2* 25.1* 26.6*  MCV 101.8* 100.0 98.9  PLT 215 173 187  Cardiac Enzymes: No results for input(s): CKTOTAL, CKMB, CKMBINDEX, TROPONINI in the last 168 hours. BNP: Invalid input(s): POCBNP CBG: Recent Labs  Lab 08/09/19 2107 08/10/19 0731 08/10/19 1245  GLUCAP 134* 134* 105*   D-Dimer No results for input(s): DDIMER in the last 72 hours. Hgb A1c Recent Labs    08/10/19 0131  HGBA1C 5.9*   Lipid Profile No results for input(s): CHOL, HDL, LDLCALC, TRIG, CHOLHDL, LDLDIRECT in the last 72 hours. Thyroid function studies No results for input(s): TSH, T4TOTAL, T3FREE, THYROIDAB in the last 72 hours.  Invalid input(s): FREET3 Anemia work up Recent Labs    08/10/19 0131  VITAMINB12 568  FOLATE 6.8  FERRITIN 12  TIBC 556*  IRON 28   Microbiology Recent Results (from the past 240 hour(s))  SARS CORONAVIRUS 2 (TAT 6-24 HRS) Nasopharyngeal Nasopharyngeal Swab     Status: None   Collection Time: 08/09/19  2:37 PM   Specimen: Nasopharyngeal Swab  Result Value Ref Range Status   SARS Coronavirus 2 NEGATIVE NEGATIVE Final    Comment: (NOTE) SARS-CoV-2 target nucleic acids are NOT DETECTED. The SARS-CoV-2 RNA is generally detectable in upper and lower respiratory specimens during the acute phase of infection. Negative results do not preclude SARS-CoV-2 infection, do not rule out co-infections with other pathogens, and should not be used as the sole basis for treatment or other patient management decisions. Negative results must be combined with clinical observations, patient history, and epidemiological information. The expected result is Negative. Fact Sheet for Patients: SugarRoll.be Fact Sheet for Healthcare Providers: https://www.woods-mathews.com/ This test is not yet  approved or cleared by the [place] FDA and  has been authorized for detection and/or diagnosis of SARS-CoV-2 by FDA under an Emergency Use Authorization (EUA). This EUA will remain  in effect (meaning this test can be used) for the duration of the COVID-19 declaration under Section 56 4(b)(1) of the Act, 21 U.S.C. section 360bbb-3(b)(1), unless the authorization is terminated or revoked sooner. Performed at [facility], [place] [address], [place], [place] [address]   MRSA PCR Screening     Status: None   Collection Time: 08/09/19  5:34 PM   Specimen: Nasopharyngeal  Result Value Ref Range Status   MRSA by PCR NEGATIVE NEGATIVE Final    Comment:        The GeneXpert MRSA Assay (FDA approved for NASAL specimens only), is one component of a comprehensive MRSA colonization surveillance program. It is not intended to diagnose MRSA infection nor to guide or monitor treatment for MRSA infections. Performed at [facility], [place] [address], [place], [place] [address]     Please note: You were cared for by a hospitalist during your hospital stay. Once you are discharged, your primary care physician will handle any further medical issues. Please note that NO REFILLS for any discharge medications will be authorized once you are discharged, as it is imperative that you return to your primary care physician (or establish a relationship with a primary care physician if you do not have one) for your post hospital discharge needs so that they can reassess your need for medications and monitor your lab values.  Signed: Marlowe Aschoff Jaryan [place]ine  [facility] 08/10/2019, 1:25 PM

## 2019-08-10 NOTE — Progress Notes (Signed)
Pt has experienced no relief with restraint placement. Will continue to monitor.

## 2019-08-10 NOTE — Progress Notes (Signed)
Pt blood transfusion has completeded with no complications vss will continue to monitor.

## 2019-08-10 NOTE — Progress Notes (Signed)
Order has been placed for non violent wrist restraints. Will continue to monitor.

## 2019-08-10 NOTE — Progress Notes (Signed)
New order placed RN administered medication. Will continue to monitor.

## 2019-08-10 NOTE — Consult Note (Addendum)
Consultation  Referring Provider: Dr. Pietro Cassis    Primary Care Physician:  Colon Branch, MD Primary Gastroenterologist: [facility]       Reason for Consultation: GI Bleed             HPI:   Rebecca Skinner is a 86 y.o. female with a past medical history as listed below including reflux, CAD status post CABG, CVA, diabetes, asthma and on chronic anticoagulation with Eliquis, who presented to the hospital 08/09/2019 from [facility] with concern for GI bleed.    Today, the patient is unable to answer any of my questions due to dementia.  Her history is garnered from previous physicians notes.      Previously, the patient explained that she has been having dark stools for the past week, also has noticed some bright red blood streaks on the toilet paper.  Over the past week she has had increasing dyspnea on exertion.  She went to see her PCP and Hemoccult in the office was positive.  She was then sent to the ED for further eval.    Previous history of GI bleed after being switched to Eliquis from Coumadin in 2015 and she was placed on just aspirin.  In 2018 patient had a TIA and her aspirin was switched to Eliquis again.    Family also reports memory issues should been progressively worsening over the past several months.    Denies fever, chest pain, abdominal pain, nausea, vomiting, heartburn or reflux.  ED course: Hemoglobin 6.7 (baseline around 11), FOBT positive  GI history: 03/30/2014 colonoscopy Dr. 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 Anxiety 11/20/2011  . Asthma    UN[facility] DR PAZ  . CAD (coronary artery disease)    s/p CABG s/p AO valve replacement ([place] [facility])  . CVA (cerebral infarction) 08-2013   multiple, L, d/t Afib, started eloquis  . Diabetes mellitus    type 2   . Dizziness    Chronic, admiet 07-2010,saw neuro, thought to be a peripheral issue   . Dry skin   . GERD (gastroesophageal reflux disease)   . Hyperlipidemia   . Hypertension   . Hyperthyroidism   . Keloid    @ chest  . Memory loss   . Osteoarthritis   . Osteopenia    per dexa 12/09  . Paroxysmal atrial fibrillation ([place])    cards d/c coumadin 12/2008 d/t persisten NSR and frequent falls- restarted coumadin july 2011, now on Eliquis  . Recurrent UTI   . Shortness of breath dyspnea   . TIA (transient ischemic attack)     Past Surgical History:  Procedure Laterality Date  . ABDOMINAL HYSTERECTOMY    . AORTIC VALVE REPLACEMENT  1998  . CARDIAC VALVE REPLACEMENT    . CAROTID DOPPLER  07/13/12   BILATERAL BULB/PROXIMAL ICAS;MILD AMOUNT OF FIBROUS PLAQUE WITH NO DIAMETER REDUCTION.  . CESAREAN SECTION     x 2  . COLONOSCOPY WITH PROPOFOL N/A 03/30/2014   Procedure: COLONOSCOPY WITH PROPOFOL;  Surgeon: Irene Shipper, MD;  Location: [facility];  Service: Endoscopy;  Laterality: N/A;  . CORONARY ARTERY BYPASS GRAFT  1998   SVG TO RCA  . HEMORRHOID SURGERY    . JOINT REPLACEMENT    . MYOCARDIAL PERFUSION STUDY  12/19/09   NORMAL  PATTERN OF PERFUSION IN ALL REGIONS.EF 75%.  . OOPHORECTOMY    . TOTAL KNEE ARTHROPLASTY  1999  . TRANSTHORACIC ECHOCARDIOGRAM  09/11/11   LVEF >55%.STAGE 1 DIASTOLIC DYSFUNCTION,ELEVATED LV FILLING PRESSURE.BIOPROSTHETIC AORTIC VALVE-PEAK AND MEAN GRADIENTS OF 17 MMHG AND 8 MMHG.SIGMOID SEPTUM.MILD TO MOD MR.MILD TO MOD TR.RVSP 46 MMHG.    Family History  Problem Relation Age of Onset  . Heart attack Father   . Coronary artery disease Son 30       deceased  . Hypertension Brother   . Stroke Brother   . Colon cancer Neg Hx   . Breast cancer Neg Hx   . Thyroid disease Neg Hx      Social History   Tobacco Use  . Smoking status: Former Research scientist (life sciences)  . Smokeless tobacco: Never Used  . Tobacco comment: quit 2004, smoked 1.5 ppd  Substance Use  Topics  . Alcohol use: No    Alcohol/week: 0.0 standard drinks  . Drug use: No    Prior to Admission medications   Medication Sig Start Date End Date Taking? Authorizing Provider  acetaminophen (TYLENOL) 500 MG tablet Take 1,000 mg by mouth every 6 (six) hours as needed (for pain).    Yes [provider]  albuterol (ACCUNEB) 0.63 MG/3ML nebulizer solution Take 3 mLs by nebulization every 6 (six) hours as needed for wheezing or shortness of breath. 05/25/19  Yes Paz, Alda Berthold, MD  ALPRAZolam (XANAX) 0.25 MG tablet TAKE 1/2 TABLET OR TAKE 1 TABLET BY MOUTH AT NIGHT AS NEEDED FOR INSOMINIA Patient taking differently: Take 0.25-0.5 mg by mouth at bedtime as needed (for insomnia).  11/11/18  Yes Paz, Alda Berthold, MD  amLODipine (NORVASC) 10 MG tablet Take 1 tablet (10 mg total) by mouth daily. 12/08/18  Yes Paz, Alda Berthold, MD  apixaban (ELIQUIS) 5 MG TABS tablet Take 1 tablet (5 mg total) by mouth 2 (two) times daily. 05/31/19  Yes Paz, Alda Berthold, MD  atorvastatin (LIPITOR) 40 MG tablet TAKE 1 TABLET BY MOUTH EVERYDAY AT BEDTIME Patient taking differently: Take 40 mg by mouth daily at 6 PM.  06/18/19  Yes Paz, Alda Berthold, MD  benazepril (LOTENSIN) 40 MG tablet TAKE 1 TABLET BY MOUTH EVERY DAY Patient taking differently: Take 40 mg by mouth daily.  05/26/19  Yes Paz, Alda Berthold, MD  bismuth subsalicylate (PEPTO BISMOL) 262 MG chewable tablet Chew 524 mg by mouth as needed for indigestion or diarrhea or loose stools.   Yes [provider]  budesonide (PULMICORT) 0.5 MG/2ML nebulizer solution Take 2 mLs (0.5 mg total) by nebulization daily. 03/26/19  Yes Paz, Alda Berthold, MD  Calcium Carbonate-Vitamin D (CALCIUM 500 + D) 500-125 MG-UNIT TABS Take 1 tablet by mouth at bedtime.   Yes [provider]  esomeprazole (NEXIUM) 40 MG capsule Take 1 capsule (40 mg total) by mouth daily as needed. Patient taking differently: Take 40 mg by mouth daily as needed (for reflux symptoms).  01/19/18  Yes Paz, Alda Berthold, MD    furosemide (LASIX) 20 MG tablet Take 2 tablets (40 mg total) by mouth daily. 07/26/19  Yes Paz, Alda Berthold, MD  isosorbide mononitrate (IMDUR) 30 MG 24 hr tablet TAKE 1 TABLET BY MOUTH EVERY DAY Patient taking differently: Take 30 mg by mouth daily.  12/21/18  Yes Troy Sine, MD  magnesium oxide (MAG-OX) 400 MG tablet Take 1 tablet (400 mg total) by mouth daily. 03/15/19  Yes Colon Branch, MD  methimazole (TAPAZOLE) 5 MG  tablet TAKE 1 TABLET BY MOUTH 3 TIMES A WEEK Patient taking differently: Take 5 mg by mouth every Monday, Wednesday, and Friday.  09/23/18  Yes Paz, Alda Berthold, MD  metoprolol tartrate (LOPRESSOR) 50 MG tablet Take 1 tablet (50 mg total) by mouth 2 (two) times daily. 07/09/19  Yes Paz, Alda Berthold, MD  nitroGLYCERIN (NITROSTAT) 0.4 MG SL tablet Place 1 tablet (0.4 mg total) under the tongue every 5 (five) minutes x 3 doses as needed for chest pain. 05/25/19  Yes Paz, Alda Berthold, MD  potassium chloride (KLOR-CON M10) 10 MEQ tablet Take 1 tablet (10 mEq total) by mouth daily. 07/09/19  Yes Paz, Alda Berthold, MD  cloNIDine (CATAPRES) 0.1 MG tablet Take 1 tablet (0.1 mg total) by mouth 3 (three) times daily as needed. Patient not taking: Reported on 08/09/2019 07/09/19   Colon Branch, MD    Current Facility-Administered Medications  Medication Dose Route Frequency Provider Last Rate Last Admin  . acetaminophen (TYLENOL) tablet 650 mg  650 mg Oral Q6H PRN Dahal, Marlowe Aschoff, MD       Or  . acetaminophen (TYLENOL) suppository 650 mg  650 mg Rectal Q6H PRN Dahal, Binaya, MD      . albuterol (PROVENTIL) (2.5 MG/3ML) 0.083% nebulizer solution 2.5 mg  2.5 mg Nebulization Q6H PRN Regalado, Belkys A, MD   2.5 mg at 08/09/19 2332  . ALPRAZolam Duanne Moron) tablet 0.25 mg  0.25 mg Oral QHS PRN Terrilee Croak, MD   0.25 mg at 08/09/19 2327  . amLODipine (NORVASC) tablet 10 mg  10 mg Oral Daily Dahal, Binaya, MD      . atorvastatin (LIPITOR) tablet 40 mg  40 mg Oral q1800 Dahal, Binaya, MD      . budesonide (PULMICORT) nebulizer  solution 0.5 mg  0.5 mg Nebulization Daily Dahal, Binaya, MD   0.5 mg at 08/10/19 0825  . Chlorhexidine Gluconate Cloth 2 % PADS 6 each  6 each Topical Q0600 Terrilee Croak, MD   6 each at 08/10/19 0544  . insulin aspart (novoLOG) injection 0-5 Units  0-5 Units Subcutaneous QHS Dahal, Binaya, MD      . insulin aspart (novoLOG) injection 0-9 Units  0-9 Units Subcutaneous TID WC Dahal, Binaya, MD      . isosorbide mononitrate (IMDUR) 24 hr tablet 30 mg  30 mg Oral Daily Dahal, Binaya, MD      . magnesium hydroxide (MILK OF MAGNESIA) suspension 30 mL  30 mL Oral Daily PRN Dahal, Binaya, MD      . metoprolol tartrate (LOPRESSOR) tablet 50 mg  50 mg Oral BID Terrilee Croak, MD   50 mg at 08/09/19 2141  . ondansetron (ZOFRAN) tablet 4 mg  4 mg Oral Q6H PRN Dahal, Binaya, MD       Or  . ondansetron (ZOFRAN) injection 4 mg  4 mg Intravenous Q6H PRN Dahal, Binaya, MD      . pantoprazole (PROTONIX) EC tablet 40 mg  40 mg Oral Daily Dahal, Binaya, MD   40 mg at 08/09/19 2141  . senna (SENOKOT) tablet 8.6 mg  1 tablet Oral BID Terrilee Croak, MD   8.6 mg at 08/09/19 2143  . sodium chloride flush (NS) 0.9 % injection 3 mL  3 mL Intravenous Q12H Dahal, Binaya, MD      . sodium phosphate (FLEET) 7-19 GM/118ML enema 1 enema  1 enema Rectal Once PRN Dahal, Binaya, MD      . sorbitol 70 % solution 30 mL  30 mL  Oral Daily PRN Terrilee Croak, MD        Allergies as of 08/09/2019 - Review Complete 08/09/2019  Allergen Reaction Noted  . Hydrocodone Itching and Nausea Only 11/16/2008  . Tramadol hcl Itching and Nausea Only 12/18/2009     Review of Systems:    Unable to complete due to mental status   Physical Exam:  Vital signs in last 24 hours: Temp:  [95.4 F (35.2 C)-98.6 F (37 C)] 97.5 F (36.4 C) (03/02 0400) Pulse Rate:  [61-100] 83 (03/02 0400) Resp:  [16-36] 18 (03/02 0400) BP: (110-157)/(32-111) 113/60 (03/02 0400) SpO2:  [93 %-100 %] 96 % (03/02 0825) Weight:  [81.8 kg-84.9 kg] 84.9 kg (03/01  1738) Last BM Date: 08/09/19 General:   Elderly AA appears to be in NAD, Well developed, Well nourished, sleepy Head:  Normocephalic and atraumatic. Eyes:   PEERL, EOMI. No icterus. Conjunctiva pink. Ears:  Normal auditory acuity. Neck:  Supple Throat: Oral cavity and pharynx without inflammation, swelling or lesion. Teeth in good condition. Lungs: Respirations even and unlabored. Lungs clear to auscultation bilaterally.   No wheezes, crackles, or rhonchi.  Heart: Normal S1, S2. No MRG. Regular rate and rhythm. No peripheral edema, cyanosis or pallor.  Abdomen:  Soft, nondistended, nontender. No rebound or guarding. Normal bowel sounds. No appreciable masses or hepatomegaly. Rectal:  Not performed.  Msk:  Symmetrical without gross deformities. Peripheral pulses intact.  Extremities:  Without edema, no deformity or joint abnormality.  Neurologic:  Disoriented, unable to answer questions Skin:   Dry and intact without significant lesions or rashes. Psychiatric:Sleepy  LAB RESULTS: Recent Labs    08/09/19 1350 08/10/19 0131 08/10/19 0546  WBC 7.7 8.3 8.8  HGB 6.7* 7.5* 8.1*  HCT 23.2* 25.1* 26.6*  PLT 215 173 187   BMET Recent Labs    08/09/19 1350 08/10/19 0131  NA 140 141  K 5.3* 4.7  CL 107 107  CO2 25 26  GLUCOSE 125* 123*  BUN 27* 31*  CREATININE 1.26* 1.30*  CALCIUM 8.7* 8.6*   LFT Recent Labs    08/09/19 1350  PROT 7.0  ALBUMIN 3.6  AST 20  ALT 15  ALKPHOS 86  BILITOT 0.4   STUDIES: DG Chest Portable 1 View  Result Date: 08/09/2019 CLINICAL DATA:  Shortness of breath EXAM: PORTABLE CHEST 1 VIEW COMPARISON:  04/13/2018 FINDINGS: Probable mild bibasilar atelectasis. No significant pleural effusion. No pneumothorax stable cardiomegaly. Evidence of prior CABG. Calcified plaque along the thoracic aorta. IMPRESSION: Mild bibasilar atelectasis. Electronically Signed   By: Macy Mis M.D.   On: 08/09/2019 14:35     Impression / Plan:   Impression: 1.   Acute GI bleed with acute blood loss anemia: Hemoglobin 6.7 (baseline around 11)--> 1 unit PRBCs--> 8.1, melena for the past week on Eliquis, previous colonoscopy with AVMs of the cecum; could be bleeding from known AVMs in the cecum versus upper GI 2.  GERD 3.  AKI on CKD stage III 4.  Hyperkalemia 5.  History of CAD, CVA, A. fib on Eliquis 6.  Dementia  Plan: 1.  Continue to monitor hemoglobin with transfusion as needed less than 8 2.  Spoke with patient's daughter who would prefer conservative measures at this time.  Explained that this would mean we monitor her hemoglobin over the next 24 hours to ensure that it remains stable after the Eliquis is out of her system and that she is not having any further/increased amounts of melena.  If she  does have further bleeding, daughter is okay with [place] proceeding with an EGD for further eval.  For now we will hold off. 3.  Agree with Pantoprazole 40 daily. 4.  Please await any further recommendations from Dr. Fuller Plan later today.  Thank you for your kind consultation, we will continue to follow.  Lavone Nian [facility]  08/10/2019, 8:28 AM     Attending Physician Note   I have taken a history, examined the patient and reviewed the chart. I agree with the Advanced Practitioner's note, impression and recommendations.  GI bleed with IDA and ABL anemia in setting of Eliquis. Previous colonic AVMs so chronic losses from AVMs likely explain IDA and could explain acute bleeding. Ulcer, gastritis, duodenitis or neoplasm possible.     Hold Eliquis and consider long term risks, benefits of resuming anticoagulation.  Continue pantoprazole. IV iron replacement per primary service.  As discussed with patients daughter, plan for conservative management at this time. If persistent bleeding consider EGD.  She is not a good candidate for bowel prep and colonoscopy.   Lucio Edward, MD [facility]

## 2019-08-11 ENCOUNTER — Telehealth: Payer: Self-pay | Admitting: Internal Medicine

## 2019-08-11 ENCOUNTER — Telehealth: Payer: Self-pay | Admitting: *Deleted

## 2019-08-11 DIAGNOSIS — J4520 Mild intermittent asthma, uncomplicated: Secondary | ICD-10-CM

## 2019-08-11 MED ORDER — PREDNISONE 10 MG PO TABS: ORAL_TABLET | ORAL | 0 refills | Status: DC

## 2019-08-11 NOTE — Telephone Encounter (Signed)
Unable to reach pt. 1st attempt.  

## 2019-08-11 NOTE — Telephone Encounter (Signed)
Spoke with patients daughter, [name].  Marita Snellen of PCP instructions and medication change.  Completed TCM (see TCM note from today) and scheduled TCM appt for 08/12/2019.

## 2019-08-11 NOTE — Telephone Encounter (Addendum)
Spoke with patients daughter, [name].    Transition Care Management Follow-up Telephone Call  Admission: 08/09/2019-08/10/2019 Dx: GI Bleed   How have you been since you were released from the hospital? "She's so tired"   Do you understand why you were in the hospital? yes   Do you understand the discharge instructions? yes   Where were you discharged to? Home. Lives with daughter   Items Reviewed:  Medications reviewed: yes  Allergies reviewed: yes  Dietary changes reviewed: yes  Referrals reviewed: yes   Functional Questionnaire:   Activities of Daily Living (ADLs):   She states they are independent in the following: ambulation, bathing and hygiene, feeding, continence, grooming, toileting and dressing States they require assistance with the following: bathing and hygiene   Any transportation issues/concerns?: no   Any patient concerns? See telephone note from today, advised of PCP recommendations.    Confirmed importance and date/time of follow-up visits scheduled yes  Provider Appointment booked with PCP 08/12/2019 via [facility].   Confirmed with patient if condition begins to worsen call PCP or go to the ER.  Patient was given the office number and encouraged to call back with question or concerns.  : yes

## 2019-08-11 NOTE — Telephone Encounter (Signed)
Patient's daughter states that patient was admitted to the hospital with blood in stool. Patient's daughter  states that Hospital took her off her blood thinners and daughter is concern.  Patient's daughter is also concern that her mother is wheezing, would like a prescription for the wheezing   Please Advise

## 2019-08-11 NOTE — Telephone Encounter (Signed)
Advise patient daughter: -At this point, recommend not to take Eliquis or aspirin due to recent GI bleed. -Because of these she is at risk of having a stroke but the risk of bleeding is greater. -For wheezing, I noted a recent chest x-ray showed no pneumonia. -Recommend prednisone 10 mg: 3 tablets daily for 2 days, 2 tablets daily for 2 days, 1 tablet daily for 2 days, #12, no refills, please call a prescription -Increase Pulmicort nebs to twice a day every day use albuterol as needed -I am planning to set up a TCM, but if she has more questions, arrange for virtual visit today or tomorrow

## 2019-08-12 ENCOUNTER — Other Ambulatory Visit: Payer: Self-pay

## 2019-08-12 ENCOUNTER — Telehealth: Payer: Self-pay | Admitting: Internal Medicine

## 2019-08-12 ENCOUNTER — Encounter: Payer: Self-pay | Admitting: Internal Medicine

## 2019-08-12 ENCOUNTER — Ambulatory Visit (INDEPENDENT_AMBULATORY_CARE_PROVIDER_SITE_OTHER): Payer: Medicare Other | Admitting: Internal Medicine

## 2019-08-12 VITALS — BP 137/90 | HR 101 | Ht 63.0 in | Wt 180.4 lb

## 2019-08-12 DIAGNOSIS — I5032 Chronic diastolic (congestive) heart failure: Secondary | ICD-10-CM | POA: Diagnosis not present

## 2019-08-12 DIAGNOSIS — R0789 Other chest pain: Secondary | ICD-10-CM

## 2019-08-12 DIAGNOSIS — R627 Adult failure to thrive: Secondary | ICD-10-CM

## 2019-08-12 DIAGNOSIS — J4540 Moderate persistent asthma, uncomplicated: Secondary | ICD-10-CM

## 2019-08-12 DIAGNOSIS — E1159 Type 2 diabetes mellitus with other circulatory complications: Secondary | ICD-10-CM | POA: Diagnosis not present

## 2019-08-12 DIAGNOSIS — K921 Melena: Secondary | ICD-10-CM

## 2019-08-12 DIAGNOSIS — I4821 Permanent atrial fibrillation: Secondary | ICD-10-CM

## 2019-08-12 DIAGNOSIS — F0390 Unspecified dementia without behavioral disturbance: Secondary | ICD-10-CM

## 2019-08-12 DIAGNOSIS — K922 Gastrointestinal hemorrhage, unspecified: Secondary | ICD-10-CM

## 2019-08-12 MED ORDER — ESOMEPRAZOLE MAGNESIUM 40 MG PO CPDR: 40.0000 mg | DELAYED_RELEASE_CAPSULE | Freq: Every day | ORAL | 3 refills | Status: DC | PRN

## 2019-08-12 NOTE — Progress Notes (Signed)
Pre visit review using our clinic review tool, if applicable. No additional management support is needed unless otherwise documented below in the visit note. 

## 2019-08-12 NOTE — Telephone Encounter (Signed)
Scheduled [facility] Palliative visit for 08-27-19 at 1:00.

## 2019-08-12 NOTE — Assessment & Plan Note (Signed)
GI bleed:  Status post admission to the hospital, 1 PRBC, hemoglobin increased from 6.7 to 8.1.  Brown stools yesterday, seems to be doing ok.   Not taking PPIs, recommend to restart Nexium. Has a follow-up with me in person in 3 days,will need labs,  if unable to come due to weakness she will let me know. HTN: BP today is okay, benazepril was discontinued, she still takes Imdur, metoprolol, amlodipine, Lasix, potassium.  No change for now, okay to take clonidine as needed only Asthma: Reported wheezing, I started prednisone taper recently, doing better.  If symptoms resurface she will let me know. Red eye: Looks like subconjunctival hemorrhage but I am not able to fully assess her symptoms.  Ideally I would ask her to see ophthalmology. P- atrial fibrillation: Currently off Eliquis, or aspirin.   Will consider baby aspirin in the future if she is a stable.   Extensively d/w daughter risk vs benefit of those medicines, she verbalized understanding and asked appropriate questions General debility, advanced care planning: The patient is 69, debilitated.  I strongly encouraged to discuss among the family members how they like to medically treat her mother going forward.  Aggressive interventions unlikely to be beneficial. We will send a palliative care consult.  RTC in 3 days as scheduled, if unable to come she will let me know.

## 2019-08-12 NOTE — Progress Notes (Addendum)
Subjective:    Patient ID: Rebecca Skinner, female    DOB: 1922-09-21, 86 y.o.   MRN: [id number]  DOS:  08/12/2019 Type of visit - description: Virtual Visit via Video Note  I connected with the above patient  by a video enabled telemedicine application and verified that I am speaking with the correct person using two identifiers.   THIS ENCOUNTER IS A VIRTUAL VISIT DUE TO COVID-19 - PATIENT WAS NOT SEEN IN THE OFFICE. PATIENT HAS CONSENTED TO VIRTUAL VISIT / TELEMEDICINE VISIT   Location of patient: home  Location of provider: office  I discussed the limitations of evaluation and management by telemedicine and the availability of in person appointments. The patient expressed understanding and agreed to proceed.      Hospital follow-up, TCM  Admitted to the hospital on 08/09/2019, discharged 08/10/2019. Was admitted with dark stools, diagnosed with GI bleed, initial hemoglobin was 6.7, she was transfused 1 PRBC, hemoglobin increased to 8.1. GI consulted, they recommend conservative treatment and transfusion.  They were also considered a EGD but she was not considered a candidate for a colonoscopy.  HTN, CAD, CVA: Eliquis discontinue, benazepril on hold.   She is currently at home. Still feeling tired.  Appetite is okay. She had some wheezing, I sent a rx for steroids, wheezing improved. Has mild cough. No nausea or vomiting. Had a bowel movement yesterday, brown stools with only specks of blood.  The daughter reports no complaints of abdominal pain, I talk with the patient and she said that she was a little sore in the stomach.   Review of Systems See above Also, yesterday the left eye got red, no photophobia, mildly painful.  Past Medical History:  Diagnosis Date  . Anxiety 11/20/2011  . Asthma    [facility] Ivelisse [facility]  . CAD (coronary artery disease)    s/p CABG s/p AO valve replacement ([facility])  . CVA (cerebral infarction) 08-2013   multiple, L, d/t Afib,  started eloquis  . Diabetes mellitus    type 2   . Dizziness    Chronic, admiet 07-2010,saw neuro, thought to be a peripheral issue   . Dry skin   . GERD (gastroesophageal reflux disease)   . Hyperlipidemia   . Hypertension   . Hyperthyroidism   . Keloid    @ chest  . Memory loss   . Osteoarthritis   . Osteopenia    per dexa 12/09  . Paroxysmal atrial fibrillation ([place])    cards d/c coumadin 12/2008 d/t persisten NSR and frequent falls- restarted coumadin july 2011, now on Eliquis  . Recurrent UTI   . Shortness of breath dyspnea   . TIA (transient ischemic attack)     Past Surgical History:  Procedure Laterality Date  . ABDOMINAL HYSTERECTOMY    . AORTIC VALVE REPLACEMENT  1998  . CARDIAC VALVE REPLACEMENT    . CAROTID DOPPLER  07/13/12   BILATERAL BULB/PROXIMAL ICAS;MILD AMOUNT OF FIBROUS PLAQUE WITH NO DIAMETER REDUCTION.  . CESAREAN SECTION     x 2  . COLONOSCOPY WITH PROPOFOL N/A 03/30/2014   Procedure: COLONOSCOPY WITH PROPOFOL;  Surgeon: Irene Shipper, MD;  Location: [facility];  Service: Endoscopy;  Laterality: N/A;  . CORONARY ARTERY BYPASS GRAFT  1998   SVG TO RCA  . HEMORRHOID SURGERY    . JOINT REPLACEMENT    . MYOCARDIAL PERFUSION STUDY  12/19/09   NORMAL PATTERN OF PERFUSION IN ALL REGIONS.EF 75%.  Marland Kitchen  OOPHORECTOMY    . TOTAL KNEE ARTHROPLASTY  1999  . TRANSTHORACIC ECHOCARDIOGRAM  09/11/11   LVEF >55%.STAGE 1 DIASTOLIC DYSFUNCTION,ELEVATED LV FILLING PRESSURE.BIOPROSTHETIC AORTIC VALVE-PEAK AND MEAN GRADIENTS OF 17 MMHG AND 8 MMHG.SIGMOID SEPTUM.MILD TO MOD MR.MILD TO MOD TR.RVSP 46 MMHG.    Allergies as of 08/12/2019      Reactions   Hydrocodone Itching, Nausea Only   Tramadol Hcl Itching, Nausea Only      Medication List       Accurate as of August 12, 2019  4:40 PM. If you have any questions, ask your nurse or doctor.        acetaminophen 500 MG tablet Commonly known as: TYLENOL Take 1,000 mg by mouth every 6 (six) hours as needed (for pain).     albuterol 0.63 MG/3ML nebulizer solution Commonly known as: ACCUNEB Take 3 mLs by nebulization every 6 (six) hours as needed for wheezing or shortness of breath.   ALPRAZolam 0.25 MG tablet Commonly known as: XANAX TAKE 1/2 TABLET OR TAKE 1 TABLET BY MOUTH AT NIGHT AS NEEDED FOR INSOMINIA What changed: See the new instructions.   amLODipine 10 MG tablet Commonly known as: NORVASC Take 1 tablet (10 mg total) by mouth daily.   atorvastatin 40 MG tablet Commonly known as: LIPITOR TAKE 1 TABLET BY MOUTH EVERYDAY AT BEDTIME What changed: See the new instructions.   bismuth subsalicylate 99991111 MG chewable tablet Commonly known as: PEPTO BISMOL Chew 524 mg by mouth as needed for indigestion or diarrhea or loose stools.   budesonide 0.5 MG/2ML nebulizer solution Commonly known as: PULMICORT Take 2 mLs (0.5 mg total) by nebulization daily.   Calcium 500 + D 500-125 MG-UNIT Tabs Generic drug: Calcium Carbonate-Vitamin D Take 1 tablet by mouth at bedtime.   cloNIDine 0.1 MG tablet Commonly known as: CATAPRES Take 1 tablet (0.1 mg total) by mouth 3 (three) times daily as needed.   esomeprazole 40 MG capsule Commonly known as: NEXIUM Take 1 capsule (40 mg total) by mouth daily as needed. What changed: reasons to take this   furosemide 20 MG tablet Commonly known as: LASIX Take 2 tablets (40 mg total) by mouth daily as needed.   isosorbide mononitrate 30 MG 24 hr tablet Commonly known as: IMDUR TAKE 1 TABLET BY MOUTH EVERY DAY   magnesium oxide 400 MG tablet Commonly known as: MAG-OX Take 1 tablet (400 mg total) by mouth daily.   methimazole 5 MG tablet Commonly known as: TAPAZOLE TAKE 1 TABLET BY MOUTH 3 TIMES A WEEK What changed: See the new instructions.   metoprolol tartrate 50 MG tablet Commonly known as: LOPRESSOR Take 1 tablet (50 mg total) by mouth 2 (two) times daily.   nitroGLYCERIN 0.4 MG SL tablet Commonly known as: NITROSTAT Place 1 tablet (0.4 mg total)  under the tongue every 5 (five) minutes x 3 doses as needed for chest pain.   potassium chloride 10 MEQ tablet Commonly known as: Klor-Con M10 Take 1 tablet (10 mEq total) by mouth daily.   predniSONE 10 MG tablet Commonly known as: DELTASONE 3 tabs daily x 2 days then 2 tabs daily x 2 days then 1 tab daily x 2 days.        Objective:   Physical Exam BP 137/90   Pulse (!) 101   Ht 5\' 3"  (1.6 m)   Wt 180 lb 6 oz (81.8 kg)   BMI 31.95 kg/m  This is a virtual video visit, the patient is lying down  in bed, she looks weak but not toxic. Left eye: It is red, looks like subconjunctival hemorrhage.    Assessment    Assessment DM HTN Hyperlipidemia Hyperthyroidism -- used to see Dr. Loanne Drilling, last visit 01-2016, now f/u PCP, check labs x 2 q year GERD Asthma  Anxiety on xanax  CV: Dr. Claiborne Billings --CHF, CAD, CABG, Aortic valve replacement --P- atrial fibrillation, used to be on Coumadin, Eliquis rx after the stroke 3-15, d/c 03-2014 d/t GI bleed , on ASA 81. Dx TIA 08-2016, back on eliquis, asa d/c.   NEURO --TIAs (admited 08-2016), CVA 2015 --Dementia MMSE ~ 12 to 15  (12-2016) DJD Osteopenia 2009 Recurrent UTIs Daughter  Denaisha Eiden; lives w/ [name]  PLAN   GI bleed:  Status post admission to the hospital, 1 PRBC, hemoglobin increased from 6.7 to 8.1.  Brown stools yesterday, seems to be doing ok.   Not taking PPIs, recommend to restart Nexium. Has a follow-up with me in person in 3 days,will need labs,  if unable to come due to weakness she will let me know. HTN: BP today is okay, benazepril was discontinued, she still takes Imdur, metoprolol, amlodipine, Lasix, potassium.  No change for now, okay to take clonidine as needed only Asthma: Reported wheezing, I started prednisone taper recently, doing better.  If symptoms resurface she will let me know. Red eye: Looks like subconjunctival hemorrhage but I am not able to fully assess her symptoms.  Ideally I would ask her to see  ophthalmology. P- atrial fibrillation: Currently off Eliquis, or aspirin.   Will consider baby aspirin in the future if she is a stable.   Extensively d/w daughter risk vs benefit of those medicines, she verbalized understanding and asked appropriate questions General debility, advanced care planning: The patient is 44, debilitated.  I strongly encouraged to discuss among the family members how they like to medically treat her mother going forward.  Aggressive interventions unlikely to be beneficial. We will send a palliative care consult.  RTC in 3 days as scheduled, if unable to come she will let me know.  > > 40 min       I discussed the assessment and treatment plan with the patient. The patient was provided an opportunity to ask questions and all were answered. The patient agreed with the plan and demonstrated an understanding of the instructions.   The patient was advised to call back or seek an in-person evaluation if the symptoms worsen or if the condition fails to improve as anticipated.

## 2019-08-12 NOTE — Telephone Encounter (Signed)
Rx sent 

## 2019-08-12 NOTE — Telephone Encounter (Signed)
Medication: esomeprazole (NEXIUM) 40 MG capsule    Has the patient contacted their pharmacy? No. (If no, request that the patient contact the pharmacy for the refill.) (If yes, when and what did the pharmacy advise?)  Preferred Pharmacy (with phone number or street name):  [id number] [address], [place]  [address] [place] [address]  Phone:  [phone] Fax:  [phone]  DEA #:  [id number] Agent: Please be advised that RX refills may take up to 3 business days. We ask that you follow-up with your pharmacy.

## 2019-08-16 ENCOUNTER — Telehealth: Payer: Self-pay

## 2019-08-16 ENCOUNTER — Other Ambulatory Visit: Payer: Self-pay

## 2019-08-16 ENCOUNTER — Encounter: Payer: Self-pay | Admitting: Internal Medicine

## 2019-08-16 ENCOUNTER — Ambulatory Visit (INDEPENDENT_AMBULATORY_CARE_PROVIDER_SITE_OTHER): Payer: Medicare Other | Admitting: Internal Medicine

## 2019-08-16 VITALS — BP 150/64 | HR 69 | Temp 96.2°F | Resp 18 | Ht 63.0 in | Wt 181.4 lb

## 2019-08-16 DIAGNOSIS — R5381 Other malaise: Secondary | ICD-10-CM | POA: Diagnosis not present

## 2019-08-16 DIAGNOSIS — I10 Essential (primary) hypertension: Secondary | ICD-10-CM | POA: Diagnosis not present

## 2019-08-16 DIAGNOSIS — K921 Melena: Secondary | ICD-10-CM | POA: Diagnosis not present

## 2019-08-16 DIAGNOSIS — Z8673 Personal history of transient ischemic attack (TIA), and cerebral infarction without residual deficits: Secondary | ICD-10-CM

## 2019-08-16 DIAGNOSIS — J45901 Unspecified asthma with (acute) exacerbation: Secondary | ICD-10-CM

## 2019-08-16 LAB — CBC WITH DIFFERENTIAL/PLATELET
Basophils Relative: 0.2 % (ref 0.0–3.0)
Eosinophils Relative: 1.1 % (ref 0.0–5.0)
HCT: 28.9 % — ABNORMAL LOW (ref 36.0–46.0)
Hemoglobin: 9 g/dL — ABNORMAL LOW (ref 12.0–15.0)
Lymphocytes Relative: 4.9 % — ABNORMAL LOW (ref 12.0–46.0)
MCHC: 31 g/dL (ref 30.0–36.0)
MCV: 96.3 fl (ref 78.0–100.0)
Monocytes Relative: 10.7 % (ref 3.0–12.0)
Neutro Abs: 8.3 10*3/uL — ABNORMAL HIGH (ref 1.4–7.7)
Neutrophils Relative %: 83.1 % — ABNORMAL HIGH (ref 43.0–77.0)
Platelets: 213 10*3/uL (ref 150.0–400.0)
RBC: 3 Mil/uL — ABNORMAL LOW (ref 3.87–5.11)
RDW: 17.2 % — ABNORMAL HIGH (ref 11.5–15.5)
WBC: 10 10*3/uL (ref 4.0–10.5)

## 2019-08-16 LAB — BASIC METABOLIC PANEL
BUN: 24 mg/dL — ABNORMAL HIGH (ref 6–23)
CO2: 24 mEq/L (ref 19–32)
Calcium: 8.7 mg/dL (ref 8.4–10.5)
Chloride: 111 mEq/L (ref 96–112)
Creatinine, Ser: 1.05 mg/dL (ref 0.40–1.20)
GFR: 58.98 mL/min — ABNORMAL LOW (ref >60.00)
Glucose, Bld: 112 mg/dL — ABNORMAL HIGH (ref 70–99)
Potassium: 4.4 mEq/L (ref 3.5–5.1)
Sodium: 144 mEq/L (ref 135–145)

## 2019-08-16 MED ORDER — ARFORMOTEROL TARTRATE 15 MCG/2ML IN NEBU: 15.0000 ug | INHALATION_SOLUTION | Freq: Two times a day (BID) | RESPIRATORY_TRACT | 5 refills | Status: DC

## 2019-08-16 MED ORDER — QUETIAPINE FUMARATE 25 MG PO TABS: 25.0000 mg | ORAL_TABLET | Freq: Every day | ORAL | 3 refills | Status: DC

## 2019-08-16 NOTE — Patient Instructions (Signed)
For asthma: Add Brovana 1 nebulization twice a day Continue Pulmicort twice a day Albuterol as needed.  At nighttime use Seroquel 25 mg 1 tablet to help her sleep.  If is not working please let me know  Restart Lasix 1 tablet daily  Restart aspirin 81 mg 1 a day    GO TO THE LAB : Get the blood work     GO TO THE FRONT DESK Schedule a virtual visit in 2 weeks    Check the  blood pressure daily BP GOAL is between 110/65 and  135/85. If it is consistently higher or lower, let me know

## 2019-08-16 NOTE — Progress Notes (Signed)
Subjective:    Patient ID: Rebecca Skinner, female    DOB: 05/12/1923, 86 y.o.   MRN: [id number]  DOS:  08/16/2019 Type of visit - description: Follow-up Today we talk about all her chronic medical problems. HTN, asthma, recent GI bleed, dementia.     Review of Systems Appetite is okay, tolerating food well except yesterday, had postprandial vomiting x1. Stools are brown. Still has some wheezing and mild cough, was better while on prednisone. As far as dementia, she is not sleeping well, at night she "calls people".   Past Medical History:  Diagnosis Date  . Anxiety 11/20/2011  . Asthma    [facility] Zyanne Schumm  . CAD (coronary artery disease)    s/p CABG s/p AO valve replacement ([place] [facility])  . CVA (cerebral infarction) 08-2013   multiple, L, d/t Afib, started eloquis  . Diabetes mellitus    type 2   . Dizziness    Chronic, admiet 07-2010,saw neuro, thought to be a peripheral issue   . Dry skin   . GERD (gastroesophageal reflux disease)   . Hyperlipidemia   . Hypertension   . Hyperthyroidism   . Keloid    @ chest  . Memory loss   . Osteoarthritis   . Osteopenia    per dexa 12/09  . Paroxysmal atrial fibrillation ([place])    cards d/c coumadin 12/2008 d/t persisten NSR and frequent falls- restarted coumadin july 2011, now on Eliquis  . Recurrent UTI   . Shortness of breath dyspnea   . TIA (transient ischemic attack)     Past Surgical History:  Procedure Laterality Date  . ABDOMINAL HYSTERECTOMY    . AORTIC VALVE REPLACEMENT  1998  . CARDIAC VALVE REPLACEMENT    . CAROTID DOPPLER  07/13/12   BILATERAL BULB/PROXIMAL ICAS;MILD AMOUNT OF FIBROUS PLAQUE WITH NO DIAMETER REDUCTION.  . CESAREAN SECTION     x 2  . COLONOSCOPY WITH PROPOFOL N/A 03/30/2014   Procedure: COLONOSCOPY WITH PROPOFOL;  Surgeon: Irene Shipper, MD;  Location: [facility];  Service: Endoscopy;  Laterality: N/A;  . CORONARY ARTERY BYPASS GRAFT  1998   SVG TO RCA  . HEMORRHOID SURGERY    .  JOINT REPLACEMENT    . MYOCARDIAL PERFUSION STUDY  12/19/09   NORMAL PATTERN OF PERFUSION IN ALL REGIONS.EF 75%.  . OOPHORECTOMY    . TOTAL KNEE ARTHROPLASTY  1999  . TRANSTHORACIC ECHOCARDIOGRAM  09/11/11   LVEF >55%.STAGE 1 DIASTOLIC DYSFUNCTION,ELEVATED LV FILLING PRESSURE.BIOPROSTHETIC AORTIC VALVE-PEAK AND MEAN GRADIENTS OF 17 MMHG AND 8 MMHG.SIGMOID SEPTUM.MILD TO MOD MR.MILD TO MOD TR.RVSP 46 MMHG.    Allergies as of 08/16/2019      Reactions   Hydrocodone Itching, Nausea Only   Tramadol Hcl Itching, Nausea Only      Medication List       Accurate as of August 16, 2019 11:59 PM. If you have any questions, ask your nurse or doctor.        STOP taking these medications   predniSONE 10 MG tablet Commonly known as: DELTASONE Stopped by: Kathlene November, MD     TAKE these medications   acetaminophen 500 MG tablet Commonly known as: TYLENOL Take 1,000 mg by mouth every 6 (six) hours as needed (for pain).   albuterol 0.63 MG/3ML nebulizer solution Commonly known as: ACCUNEB Take 3 mLs by nebulization every 6 (six) hours as needed for wheezing or shortness of breath.   ALPRAZolam 0.25 MG tablet Commonly  known as: XANAX TAKE 1/2 TABLET OR TAKE 1 TABLET BY MOUTH AT NIGHT AS NEEDED FOR INSOMINIA What changed: See the new instructions.   amLODipine 10 MG tablet Commonly known as: NORVASC Take 1 tablet (10 mg total) by mouth daily.   arformoterol 15 MCG/2ML Nebu Commonly known as: BROVANA Take 2 mLs (15 mcg total) by nebulization in the morning and at bedtime. Started by: Kathlene November, MD   aspirin EC 81 MG tablet Take 1 tablet (81 mg total) by mouth daily.   atorvastatin 40 MG tablet Commonly known as: LIPITOR TAKE 1 TABLET BY MOUTH EVERYDAY AT BEDTIME What changed: See the new instructions.   bismuth subsalicylate 99991111 MG chewable tablet Commonly known as: PEPTO BISMOL Chew 524 mg by mouth as needed for indigestion or diarrhea or loose stools.   budesonide 0.5 MG/2ML nebulizer  solution Commonly known as: PULMICORT Take 2 mLs (0.5 mg total) by nebulization daily.   Calcium 500 + D 500-125 MG-UNIT Tabs Generic drug: Calcium Carbonate-Vitamin D Take 1 tablet by mouth at bedtime.   cloNIDine 0.1 MG tablet Commonly known as: CATAPRES Take 1 tablet (0.1 mg total) by mouth 3 (three) times daily as needed.   esomeprazole 40 MG capsule Commonly known as: NEXIUM Take 1 capsule (40 mg total) by mouth daily as needed.   furosemide 20 MG tablet Commonly known as: LASIX Take 1 tablet (20 mg total) by mouth daily. What changed:   how much to take  when to take this  reasons to take this Changed by: Kathlene November, MD   isosorbide mononitrate 30 MG 24 hr tablet Commonly known as: IMDUR TAKE 1 TABLET BY MOUTH EVERY DAY   magnesium oxide 400 MG tablet Commonly known as: MAG-OX Take 1 tablet (400 mg total) by mouth daily.   methimazole 5 MG tablet Commonly known as: TAPAZOLE TAKE 1 TABLET BY MOUTH 3 TIMES A WEEK What changed: See the new instructions.   metoprolol tartrate 50 MG tablet Commonly known as: LOPRESSOR Take 1 tablet (50 mg total) by mouth 2 (two) times daily.   nitroGLYCERIN 0.4 MG SL tablet Commonly known as: NITROSTAT Place 1 tablet (0.4 mg total) under the tongue every 5 (five) minutes x 3 doses as needed for chest pain.   potassium chloride 10 MEQ tablet Commonly known as: Klor-Con M10 Take 1 tablet (10 mEq total) by mouth daily.   QUEtiapine 25 MG tablet Commonly known as: SEROquel Take 1 tablet (25 mg total) by mouth at bedtime. Started by: Kathlene November, MD             Objective:   Physical Exam BP (!) 150/64 (BP Location: Right Arm, Patient Position: Sitting, Cuff Size: Normal)   Pulse 69   Temp (!) 96.2 F (35.7 C) (Temporal)   Resp 18   Ht 5\' 3"  (1.6 m)   Wt 181 lb 6 oz (82.3 kg)   SpO2 94%   BMI 32.13 kg/m  General:   Well developed, NAD, BMI noted. HEENT:  Normocephalic . Right normal Left eye: + Red conjunctiva,  likely from subconjunctival hemorrhage.  Part of the cornea seems cloudy.  Anterior chamber clear. Lungs:  Creased breath sounds, poor inspiratory effort, mild wheezing noted. Heart: Irregularly irregular, soft murmur noted.  No new   Lower extremities: no pretibial edema bilaterally  Skin: Not pale. Not jaundice Neurologic:  alert & cooperative. Speech: Tries to answer questions.  Please send.  Gait not tested Psych--  Cognition and judgment appear intact.  Cooperative with normal attention span and concentration.  Behavior appropriate. No anxious or depressed appearing.      Assessment     Assessment DM HTN Hyperlipidemia Hyperthyroidism -- used to see Dr. Loanne Drilling, last visit 01-2016, now f/u PCP, check labs x 2 q year GERD Asthma  Anxiety on xanax  CV: Dr. Claiborne Billings --CHF, CAD, CABG, Aortic valve replacement --P- atrial fibrillation, used to be on Coumadin, Eliquis rx after the stroke 3-15, d/c 03-2014 d/t GI bleed , on ASA 81. Dx TIA 08-2016, back on eliquis, asa d/c.   NEURO --TIAs (admited 08-2016), CVA 2015 --Dementia MMSE ~ 12 to 15  (12-2016) DJD Osteopenia 2009 Recurrent UTIs Daughter  Crystalrose Washer; lives w/ [name]  PLAN   GI bleed: Apparently she is having normal BMs, problem seem to be resolved. HTN: Yesterday BP was 147/78 at home.  She is on amlodipine, Imdur, metoprolol, potassium.  She was getting Lasix "as needed".  Recommend to get Lasix 20 mg every day.  Keep clonidine for as needed only.  Check BMP. Asthma: Still wheezing, got better with prednisone.  No fever.  We will continue Pulmicort and add Brovana twice daily.  Albuterol as needed.  Would like to decrease or stop beta-blockers but she has a history of atrial fibrillation. Dementia: Last night did not sleep and was confused, calling people.  Xanax at night is not helping much.  Recommend to start Seroquel 25 mg nightly as needed.  Daughter will tell me if that works. History of TIA: Patient's daughter is quite  concerned about another TIA or a stroke, she seems to be stable without active GI bleed, restart aspirin 81 mg. General debility, advanced care planning.  Advise conservative treatment, palliative care consult pending.  Time spent with the patient and her daughter 42 minutes, advising about her chronic medical problems but also about advance care planning, benefits of palliative care.  This visit occurred during the SARS-CoV-2 public health emergency.  Safety protocols were in place, including screening questions prior to the visit, additional usage of staff PPE, and extensive cleaning of exam room while observing appropriate contact time as indicated for disinfecting solutions.

## 2019-08-16 NOTE — Progress Notes (Signed)
Pre visit review using our clinic review tool, if applicable. No additional management support is needed unless otherwise documented below in the visit note. 

## 2019-08-16 NOTE — Telephone Encounter (Signed)
PA initiated via Covermymeds; KEY: [id number]. Awaiting determination.

## 2019-08-16 NOTE — Telephone Encounter (Signed)
PA denied for Part D coverage, however is approved through 06/09/2020 for Part B.

## 2019-08-17 NOTE — Assessment & Plan Note (Signed)
GI bleed: Apparently she is having normal BMs, problem seem to be resolved. HTN: Yesterday BP was 147/78 at home.  She is on amlodipine, Imdur, metoprolol, potassium.  She was getting Lasix "as needed".  Recommend to get Lasix 20 mg every day.  Keep clonidine for as needed only.  Check BMP. Asthma: Still wheezing, got better with prednisone.  No fever.  We will continue Pulmicort and add Brovana twice daily.  Albuterol as needed.  Would like to decrease or stop beta-blockers but she has a history of atrial fibrillation. Dementia: Last night did not sleep and was confused, calling people.  Xanax at night is not helping much.  Recommend to start Seroquel 25 mg nightly as needed.  Daughter will tell me if that works. History of TIA: Patient's daughter is quite concerned about another TIA or a stroke, she seems to be stable without active GI bleed, restart aspirin 81 mg. General debility, advanced care planning.  Advise conservative treatment, palliative care consult pending.

## 2019-08-18 ENCOUNTER — Encounter: Payer: Self-pay | Admitting: Internal Medicine

## 2019-08-24 ENCOUNTER — Other Ambulatory Visit: Payer: Self-pay

## 2019-08-24 ENCOUNTER — Emergency Department (HOSPITAL_COMMUNITY): Payer: Medicare Other

## 2019-08-24 ENCOUNTER — Inpatient Hospital Stay (HOSPITAL_COMMUNITY)
Admission: EM | Admit: 2019-08-24 | Discharge: 2019-08-30 | DRG: 291 | Disposition: A | Discharge status: Home / Self Care | Payer: Medicare Other | Attending: Family Medicine | Admitting: Family Medicine

## 2019-08-24 ENCOUNTER — Ambulatory Visit: Payer: Medicare Other | Admitting: Nurse Practitioner

## 2019-08-24 ENCOUNTER — Encounter (HOSPITAL_COMMUNITY): Payer: Self-pay

## 2019-08-24 DIAGNOSIS — I2510 Atherosclerotic heart disease of native coronary artery without angina pectoris: Secondary | ICD-10-CM | POA: Diagnosis not present

## 2019-08-24 DIAGNOSIS — Z743 Need for continuous supervision: Secondary | ICD-10-CM | POA: Diagnosis not present

## 2019-08-24 DIAGNOSIS — E0590 Thyrotoxicosis, unspecified without thyrotoxic crisis or storm: Secondary | ICD-10-CM | POA: Diagnosis not present

## 2019-08-24 DIAGNOSIS — Z8249 Family history of ischemic heart disease and other diseases of the circulatory system: Secondary | ICD-10-CM | POA: Diagnosis not present

## 2019-08-24 DIAGNOSIS — D539 Nutritional anemia, unspecified: Secondary | ICD-10-CM | POA: Diagnosis present

## 2019-08-24 DIAGNOSIS — R402411 Glasgow coma scale score 13-15, in the field [EMT or ambulance]: Secondary | ICD-10-CM | POA: Diagnosis not present

## 2019-08-24 DIAGNOSIS — I5031 Acute diastolic (congestive) heart failure: Secondary | ICD-10-CM | POA: Diagnosis not present

## 2019-08-24 DIAGNOSIS — K219 Gastro-esophageal reflux disease without esophagitis: Secondary | ICD-10-CM | POA: Diagnosis not present

## 2019-08-24 DIAGNOSIS — E1159 Type 2 diabetes mellitus with other circulatory complications: Secondary | ICD-10-CM | POA: Diagnosis present

## 2019-08-24 DIAGNOSIS — Z87891 Personal history of nicotine dependence: Secondary | ICD-10-CM | POA: Diagnosis not present

## 2019-08-24 DIAGNOSIS — Z951 Presence of aortocoronary bypass graft: Secondary | ICD-10-CM | POA: Diagnosis not present

## 2019-08-24 DIAGNOSIS — Z8673 Personal history of transient ischemic attack (TIA), and cerebral infarction without residual deficits: Secondary | ICD-10-CM | POA: Diagnosis not present

## 2019-08-24 DIAGNOSIS — J449 Chronic obstructive pulmonary disease, unspecified: Secondary | ICD-10-CM | POA: Diagnosis not present

## 2019-08-24 DIAGNOSIS — Z953 Presence of xenogenic heart valve: Secondary | ICD-10-CM

## 2019-08-24 DIAGNOSIS — I509 Heart failure, unspecified: Secondary | ICD-10-CM | POA: Diagnosis not present

## 2019-08-24 DIAGNOSIS — R0602 Shortness of breath: Secondary | ICD-10-CM | POA: Diagnosis not present

## 2019-08-24 DIAGNOSIS — R195 Other fecal abnormalities: Secondary | ICD-10-CM

## 2019-08-24 DIAGNOSIS — N1831 Chronic kidney disease, stage 3a: Secondary | ICD-10-CM | POA: Diagnosis not present

## 2019-08-24 DIAGNOSIS — D631 Anemia in chronic kidney disease: Secondary | ICD-10-CM | POA: Diagnosis present

## 2019-08-24 DIAGNOSIS — Z66 Do not resuscitate: Secondary | ICD-10-CM | POA: Diagnosis present

## 2019-08-24 DIAGNOSIS — R404 Transient alteration of awareness: Secondary | ICD-10-CM | POA: Diagnosis not present

## 2019-08-24 DIAGNOSIS — I4821 Permanent atrial fibrillation: Secondary | ICD-10-CM | POA: Diagnosis present

## 2019-08-24 DIAGNOSIS — D469 Myelodysplastic syndrome, unspecified: Secondary | ICD-10-CM | POA: Diagnosis present

## 2019-08-24 DIAGNOSIS — R4182 Altered mental status, unspecified: Secondary | ICD-10-CM | POA: Diagnosis present

## 2019-08-24 DIAGNOSIS — K921 Melena: Secondary | ICD-10-CM | POA: Diagnosis not present

## 2019-08-24 DIAGNOSIS — Z7982 Long term (current) use of aspirin: Secondary | ICD-10-CM

## 2019-08-24 DIAGNOSIS — I110 Hypertensive heart disease with heart failure: Secondary | ICD-10-CM | POA: Diagnosis not present

## 2019-08-24 DIAGNOSIS — M1990 Unspecified osteoarthritis, unspecified site: Secondary | ICD-10-CM | POA: Diagnosis present

## 2019-08-24 DIAGNOSIS — I480 Paroxysmal atrial fibrillation: Secondary | ICD-10-CM | POA: Diagnosis not present

## 2019-08-24 DIAGNOSIS — J9621 Acute and chronic respiratory failure with hypoxia: Secondary | ICD-10-CM | POA: Diagnosis not present

## 2019-08-24 DIAGNOSIS — E039 Hypothyroidism, unspecified: Secondary | ICD-10-CM | POA: Diagnosis present

## 2019-08-24 DIAGNOSIS — E1122 Type 2 diabetes mellitus with diabetic chronic kidney disease: Secondary | ICD-10-CM | POA: Diagnosis present

## 2019-08-24 DIAGNOSIS — I130 Hypertensive heart and chronic kidney disease with heart failure and stage 1 through stage 4 chronic kidney disease, or unspecified chronic kidney disease: Secondary | ICD-10-CM | POA: Diagnosis not present

## 2019-08-24 DIAGNOSIS — Z96659 Presence of unspecified artificial knee joint: Secondary | ICD-10-CM | POA: Diagnosis present

## 2019-08-24 DIAGNOSIS — Z823 Family history of stroke: Secondary | ICD-10-CM

## 2019-08-24 DIAGNOSIS — J9601 Acute respiratory failure with hypoxia: Secondary | ICD-10-CM

## 2019-08-24 DIAGNOSIS — I5033 Acute on chronic diastolic (congestive) heart failure: Secondary | ICD-10-CM | POA: Diagnosis not present

## 2019-08-24 DIAGNOSIS — Z20822 Contact with and (suspected) exposure to covid-19: Secondary | ICD-10-CM | POA: Diagnosis present

## 2019-08-24 DIAGNOSIS — E785 Hyperlipidemia, unspecified: Secondary | ICD-10-CM | POA: Diagnosis present

## 2019-08-24 DIAGNOSIS — I4891 Unspecified atrial fibrillation: Secondary | ICD-10-CM | POA: Diagnosis present

## 2019-08-24 DIAGNOSIS — E119 Type 2 diabetes mellitus without complications: Secondary | ICD-10-CM | POA: Diagnosis present

## 2019-08-24 DIAGNOSIS — Z7951 Long term (current) use of inhaled steroids: Secondary | ICD-10-CM

## 2019-08-24 DIAGNOSIS — F0391 Unspecified dementia with behavioral disturbance: Secondary | ICD-10-CM | POA: Diagnosis present

## 2019-08-24 DIAGNOSIS — I4811 Longstanding persistent atrial fibrillation: Secondary | ICD-10-CM | POA: Diagnosis not present

## 2019-08-24 DIAGNOSIS — I10 Essential (primary) hypertension: Secondary | ICD-10-CM | POA: Diagnosis present

## 2019-08-24 DIAGNOSIS — I5023 Acute on chronic systolic (congestive) heart failure: Secondary | ICD-10-CM | POA: Diagnosis not present

## 2019-08-24 DIAGNOSIS — F03918 Unspecified dementia, unspecified severity, with other behavioral disturbance: Secondary | ICD-10-CM | POA: Diagnosis present

## 2019-08-24 DIAGNOSIS — Z885 Allergy status to narcotic agent status: Secondary | ICD-10-CM

## 2019-08-24 DIAGNOSIS — M8580 Other specified disorders of bone density and structure, unspecified site: Secondary | ICD-10-CM | POA: Diagnosis not present

## 2019-08-24 DIAGNOSIS — E782 Mixed hyperlipidemia: Secondary | ICD-10-CM | POA: Diagnosis present

## 2019-08-24 DIAGNOSIS — K922 Gastrointestinal hemorrhage, unspecified: Secondary | ICD-10-CM | POA: Diagnosis present

## 2019-08-24 DIAGNOSIS — R079 Chest pain, unspecified: Secondary | ICD-10-CM | POA: Diagnosis not present

## 2019-08-24 DIAGNOSIS — R0789 Other chest pain: Secondary | ICD-10-CM | POA: Diagnosis not present

## 2019-08-24 LAB — TROPONIN I (HIGH SENSITIVITY)
Troponin I (High Sensitivity): 12 ng/L (ref <18)
Troponin I (High Sensitivity): 11 ng/L (ref <18)

## 2019-08-24 LAB — POCT I-STAT EG7
Acid-Base Excess: 5 mmol/L — ABNORMAL HIGH (ref 0.0–2.0)
Bicarbonate: 30.1 mmol/L — ABNORMAL HIGH (ref 20.0–28.0)
Calcium, Ion: 1.1 mmol/L — ABNORMAL LOW (ref 1.15–1.40)
HCT: 35 % — ABNORMAL LOW (ref 36.0–46.0)
Hemoglobin: 11.9 g/dL — ABNORMAL LOW (ref 12.0–15.0)
O2 Saturation: 81 %
Potassium: 4.3 mmol/L (ref 3.5–5.1)
Sodium: 143 mmol/L (ref 135–145)
TCO2: 32 mmol/L (ref 22–32)
pCO2, Ven: 47.5 mmHg (ref 44.0–60.0)
pH, Ven: 7.41 (ref 7.250–7.430)
pO2, Ven: 45 mmHg (ref 32.0–45.0)

## 2019-08-24 LAB — URINALYSIS, ROUTINE W REFLEX MICROSCOPIC
Bacteria, UA: NONE SEEN
Bilirubin Urine: NEGATIVE
Glucose, UA: NEGATIVE mg/dL
Hgb urine dipstick: NEGATIVE
Ketones, ur: NEGATIVE mg/dL
Nitrite: NEGATIVE
Protein, ur: 30 mg/dL — AB
Specific Gravity, Urine: 1.012 (ref 1.005–1.030)
pH: 5 (ref 5.0–8.0)

## 2019-08-24 LAB — LACTIC ACID, PLASMA: Lactic Acid, Venous: 1.5 mmol/L (ref 0.5–1.9)

## 2019-08-24 LAB — CBG MONITORING, ED
Glucose-Capillary: 117 mg/dL — ABNORMAL HIGH (ref 70–99)
Glucose-Capillary: 121 mg/dL — ABNORMAL HIGH (ref 70–99)

## 2019-08-24 LAB — COMPREHENSIVE METABOLIC PANEL
ALT: 16 U/L (ref 0–44)
AST: 20 U/L (ref 15–41)
Albumin: 3.3 g/dL — ABNORMAL LOW (ref 3.5–5.0)
Alkaline Phosphatase: 78 U/L (ref 38–126)
Anion gap: 10 (ref 5–15)
BUN: 13 mg/dL (ref 8–23)
CO2: 28 mmol/L (ref 22–32)
Calcium: 8.6 mg/dL — ABNORMAL LOW (ref 8.9–10.3)
Chloride: 105 mmol/L (ref 98–111)
Creatinine, Ser: 1.01 mg/dL — ABNORMAL HIGH (ref 0.44–1.00)
GFR calc Af Amer: 55 mL/min — ABNORMAL LOW (ref >60)
GFR calc non Af Amer: 48 mL/min — ABNORMAL LOW (ref >60)
Glucose, Bld: 136 mg/dL — ABNORMAL HIGH (ref 70–99)
Potassium: 4.4 mmol/L (ref 3.5–5.1)
Sodium: 143 mmol/L (ref 135–145)
Total Bilirubin: 0.6 mg/dL (ref 0.3–1.2)
Total Protein: 6.5 g/dL (ref 6.5–8.1)

## 2019-08-24 LAB — BRAIN NATRIURETIC PEPTIDE: B Natriuretic Peptide: 713.6 pg/mL — ABNORMAL HIGH (ref 0.0–100.0)

## 2019-08-24 LAB — POC OCCULT BLOOD, ED: Fecal Occult Bld: POSITIVE — AB

## 2019-08-24 LAB — SARS CORONAVIRUS 2 (TAT 6-24 HRS): SARS Coronavirus 2: NEGATIVE

## 2019-08-24 LAB — POC SARS CORONAVIRUS 2 AG -  ED: SARS Coronavirus 2 Ag: NEGATIVE

## 2019-08-24 MED ORDER — PANTOPRAZOLE SODIUM 40 MG PO TBEC
40.0000 mg | DELAYED_RELEASE_TABLET | Freq: Every day | ORAL | Status: DC
  Administered 2019-08-24 – 2019-08-30 (×6): 40 mg via ORAL
  Filled 2019-08-24 (×8): qty 1

## 2019-08-24 MED ORDER — FUROSEMIDE 10 MG/ML IJ SOLN
20.0000 mg | Freq: Two times a day (BID) | INTRAMUSCULAR | Status: DC
  Administered 2019-08-24 – 2019-08-25 (×2): 20 mg via INTRAVENOUS
  Filled 2019-08-24 (×2): qty 2

## 2019-08-24 MED ORDER — BUDESONIDE 0.5 MG/2ML IN SUSP
0.5000 mg | Freq: Every day | RESPIRATORY_TRACT | Status: DC
  Administered 2019-08-24 – 2019-08-30 (×7): 0.5 mg via RESPIRATORY_TRACT
  Filled 2019-08-24 (×6): qty 2

## 2019-08-24 MED ORDER — ACETAMINOPHEN 325 MG PO TABS
650.0000 mg | ORAL_TABLET | ORAL | Status: DC | PRN
  Administered 2019-08-28: 650 mg via ORAL
  Filled 2019-08-24: qty 2

## 2019-08-24 MED ORDER — NITROGLYCERIN 0.4 MG SL SUBL: 0.4000 mg | SUBLINGUAL_TABLET | SUBLINGUAL | Status: DC | PRN

## 2019-08-24 MED ORDER — SODIUM CHLORIDE 0.9% FLUSH
3.0000 mL | Freq: Two times a day (BID) | INTRAVENOUS | Status: DC
  Administered 2019-08-24 – 2019-08-30 (×13): 3 mL via INTRAVENOUS

## 2019-08-24 MED ORDER — MAGNESIUM OXIDE 400 (241.3 MG) MG PO TABS
400.0000 mg | ORAL_TABLET | Freq: Every day | ORAL | Status: DC
  Administered 2019-08-25 – 2019-08-30 (×5): 400 mg via ORAL
  Filled 2019-08-24 (×8): qty 1

## 2019-08-24 MED ORDER — ONDANSETRON HCL 4 MG/2ML IJ SOLN: 4.0000 mg | Freq: Four times a day (QID) | INTRAMUSCULAR | Status: DC | PRN

## 2019-08-24 MED ORDER — ATORVASTATIN CALCIUM 40 MG PO TABS
40.0000 mg | ORAL_TABLET | Freq: Every day | ORAL | Status: DC
  Administered 2019-08-24 – 2019-08-29 (×6): 40 mg via ORAL
  Filled 2019-08-24 (×2): qty 1
  Filled 2019-08-24: qty 4
  Filled 2019-08-24 (×4): qty 1

## 2019-08-24 MED ORDER — ALBUTEROL SULFATE 0.63 MG/3ML IN NEBU: 3.0000 mL | INHALATION_SOLUTION | Freq: Four times a day (QID) | RESPIRATORY_TRACT | Status: DC | PRN

## 2019-08-24 MED ORDER — SODIUM CHLORIDE 0.9 % IV SOLN: 250.0000 mL | INTRAVENOUS | Status: DC | PRN

## 2019-08-24 MED ORDER — ALBUTEROL SULFATE (2.5 MG/3ML) 0.083% IN NEBU: 2.5000 mg | INHALATION_SOLUTION | Freq: Four times a day (QID) | RESPIRATORY_TRACT | Status: DC | PRN

## 2019-08-24 MED ORDER — METOPROLOL TARTRATE 50 MG PO TABS
50.0000 mg | ORAL_TABLET | Freq: Two times a day (BID) | ORAL | Status: DC
  Administered 2019-08-24 – 2019-08-30 (×12): 50 mg via ORAL
  Filled 2019-08-24 (×12): qty 1

## 2019-08-24 MED ORDER — AMLODIPINE BESYLATE 10 MG PO TABS
10.0000 mg | ORAL_TABLET | Freq: Every day | ORAL | Status: DC
  Administered 2019-08-25 – 2019-08-28 (×4): 10 mg via ORAL
  Filled 2019-08-24 (×4): qty 1

## 2019-08-24 MED ORDER — CLONIDINE HCL 0.1 MG PO TABS: 0.1000 mg | ORAL_TABLET | Freq: Three times a day (TID) | ORAL | Status: DC | PRN

## 2019-08-24 MED ORDER — QUETIAPINE FUMARATE 25 MG PO TABS
25.0000 mg | ORAL_TABLET | Freq: Every day | ORAL | Status: DC
  Administered 2019-08-24 – 2019-08-29 (×6): 25 mg via ORAL
  Filled 2019-08-24 (×6): qty 1

## 2019-08-24 MED ORDER — ISOSORBIDE MONONITRATE ER 30 MG PO TB24
30.0000 mg | ORAL_TABLET | Freq: Every day | ORAL | Status: DC
  Administered 2019-08-25 – 2019-08-30 (×6): 30 mg via ORAL
  Filled 2019-08-24 (×6): qty 1

## 2019-08-24 MED ORDER — BUDESONIDE 0.5 MG/2ML IN SUSP: 0.5000 mg | Freq: Every day | RESPIRATORY_TRACT | Status: DC

## 2019-08-24 MED ORDER — METHIMAZOLE 5 MG PO TABS
5.0000 mg | ORAL_TABLET | ORAL | Status: DC
  Administered 2019-08-25 – 2019-08-30 (×3): 5 mg via ORAL
  Filled 2019-08-24 (×3): qty 1

## 2019-08-24 MED ORDER — POTASSIUM CHLORIDE CRYS ER 10 MEQ PO TBCR
10.0000 meq | EXTENDED_RELEASE_TABLET | Freq: Every day | ORAL | Status: DC
  Administered 2019-08-25 – 2019-08-27 (×3): 10 meq via ORAL
  Filled 2019-08-24 (×5): qty 1

## 2019-08-24 MED ORDER — SODIUM CHLORIDE 0.9% FLUSH: 3.0000 mL | INTRAVENOUS | Status: DC | PRN

## 2019-08-24 MED ORDER — FUROSEMIDE 10 MG/ML IJ SOLN
40.0000 mg | Freq: Once | INTRAMUSCULAR | Status: AC
  Administered 2019-08-24: 40 mg via INTRAVENOUS
  Filled 2019-08-24: qty 4

## 2019-08-24 MED ORDER — ASPIRIN EC 81 MG PO TBEC
81.0000 mg | DELAYED_RELEASE_TABLET | Freq: Every day | ORAL | Status: DC
  Administered 2019-08-25 – 2019-08-27 (×3): 81 mg via ORAL
  Filled 2019-08-24 (×4): qty 1

## 2019-08-24 NOTE — ED Provider Notes (Signed)
[facility] EM[facility] DE[facility]MENT Provider Note   CSN: [id number] Arrival date & time: 08/24/19  [id number]     History Chief Complaint  Patient presents with  . Altered Mental Status   Level 5 caveat due to dementia Rebecca Skinner is a 86 y.o. female with history of dementia anxiety, asthma, coronary artery disease, CVA, type 2 diabetes mellitus, GERD, hypertension, hyperlipidemia, hypothyroidism, paroxysmal A. fib presents for evaluation of acute onset hypoxia.  Per EMS, they received a call out for chest pain.  When they arrived on scene the patient was found to be lethargic and hypoxic to 76% on room air.  She is placed on 4 L supplemental oxygen with improvement.  EMS noted mild improvement in mental status after application of oxygen.  She denies any chest pain to me.  She also denies headache, abdominal pain.  When asked about shortness of breath she states "sometimes".  She is oriented to person and place but not time or events.  Of note, the patient did have a recent admission earlier this month for acute GI bleed and symptomatic anemia, her Plavix was discontinued at that time.  Her PCP recently restarted baby aspirin.  Spoke with patient's daughter Rebecca Skinner at the bedside.  The patient resides with her in her home.  She reports feeling that the patient has been more confused and "talking out of her head" over the last 2 or 3 weeks.  She notes that her stools have remained black but not tarry.  She has been giving her some Pepto-Bismol occasionally, last given yesterday.  She was post to follow-up with gastroenterology today but was unable to make the appointment due to her symptoms.  She has not noted any fever, cough, nausea or vomiting.    The history is provided by medical records and the EMS personnel. The history is limited by the condition of the patient.  Altered Mental Status      Past Medical History:  Diagnosis Date  . Anxiety 11/20/2011  . Asthma    UNDER THE  [facility]ARE [facility] DR PAZ  . CAD (coronary artery disease)    s/p CABG s/p AO valve replacement ([facility])  . CVA (cerebral infarction) 08-2013   multiple, L, d/t Afib, started eloquis  . Diabetes mellitus    type 2   . Dizziness    Chronic, admiet 07-2010,saw neuro, thought to be a peripheral issue   . Dry skin   . GERD (gastroesophageal reflux disease)   . Hyperlipidemia   . Hypertension   . Hyperthyroidism   . Keloid    @ chest  . Memory loss   . Osteoarthritis   . Osteopenia    per dexa 12/09  . Paroxysmal atrial fibrillation (Rebecca Skinner)    cards d/c coumadin 12/2008 d/t persisten NSR and frequent falls- restarted coumadin july 2011, now on Eliquis  . Recurrent UTI   . Shortness of breath dyspnea   . TIA (transient ischemic attack)     Patient Active Problem List   Diagnosis Date Noted  . Melena 08/09/2019  . Stroke-like symptom   . Hypokalemia 05/22/2019  . Chest pain 06/21/2017  . TIA (transient ischemic attack) 08/30/2016  . Acute kidney injury ([place]) 03/05/2016  . Exposure keratitis 09/23/2015  . Keratopathy, band 09/13/2015  . PCP NOTES >>>>> 04/05/2015  . Chronic diastolic heart failure ([place]) 12/29/2014  . Edema 12/20/2014  . Dyspnea 12/20/2014  . Angiectasia 03/30/2014  . GI bleed 03/28/2014  . Dementia  with behavioral disturbance ([place]) 09/16/2013  . Annual physical exam 11/21/2011  . ATRIAL FIBRILLATION, chronic 03/05/2007  . Diabetes mellitus with circulatory complication ([place]) [id number]  . S/P AVR (aortic valve replacement) 10/15/2006  . Hyperthyroidism 09/03/2006  . Dyslipidemia 09/03/2006  . HTN (hypertension) 09/03/2006  . CAD (coronary artery disease) of artery bypass graft 09/03/2006  . Asthma, chronic 09/03/2006    Past Surgical History:  Procedure Laterality Date  . ABDOMINAL HYSTERECTOMY    . AORTIC VALVE REPLACEMENT  1998  . CARDIAC VALVE REPLACEMENT    . CAROTID DOPPLER  07/13/12   BILATERAL BULB/PROXIMAL ICAS;MILD AMOUNT OF FIBROUS PLAQUE WITH  NO DIAMETER REDUCTION.  . CESAREAN SECTION     x 2  . COLONOSCOPY WITH PROPOFOL N/A 03/30/2014   Procedure: COLONOSCOPY WITH PROPOFOL;  Surgeon: Irene Shipper, MD;  Location: [facility];  Service: Endoscopy;  Laterality: N/A;  . CORONARY ARTERY BYPASS GRAFT  1998   SVG TO RCA  . HEMORRHOID SURGERY    . JOINT REPLACEMENT    . MYOCARDIAL PERFUSION STUDY  12/19/09   NORMAL PATTERN OF PERFUSION IN ALL REGIONS.EF 75%.  . OOPHORECTOMY    . TOTAL KNEE ARTHROPLASTY  1999  . TRANSTHORACIC ECHOCARDIOGRAM  09/11/11   LVEF >55%.STAGE 1 DIASTOLIC DYSFUNCTION,ELEVATED LV FILLING PRESSURE.BIOPROSTHETIC AORTIC VALVE-PEAK AND MEAN GRADIENTS OF 17 MMHG AND 8 MMHG.SIGMOID SEPTUM.MILD TO MOD MR.MILD TO MOD TR.RVSP 46 MMHG.     OB History    Gravida  4   Para  4   Term      Preterm      AB      Living        SAB      TAB      Ectopic      Multiple      Live Births              Family History  Problem Relation Age of Onset  . Heart attack Father   . Coronary artery disease Son 41       deceased  . Hypertension Brother   . Stroke Brother   . Colon cancer Neg Hx   . Breast cancer Neg Hx   . Thyroid disease Neg Hx     Social History   Tobacco Use  . Smoking status: Former Research scientist (life sciences)  . Smokeless tobacco: Never Used  . Tobacco comment: quit 2004, smoked 1.5 ppd  Substance Use Topics  . Alcohol use: No    Alcohol/week: 0.0 standard drinks  . Drug use: No    Home Medications Prior to Admission medications   Medication Sig Start Date End Date Taking? Authorizing Provider  acetaminophen (TYLENOL) 500 MG tablet Take 1,000 mg by mouth every 6 (six) hours as needed (for pain).     [provider]  albuterol (ACCUNEB) 0.63 MG/3ML nebulizer solution Take 3 mLs by nebulization every 6 (six) hours as needed for wheezing or shortness of breath. 05/25/19   Colon Branch, MD  ALPRAZolam Duanne Moron) 0.25 MG tablet TAKE 1/2 TABLET OR TAKE 1 TABLET BY MOUTH AT NIGHT AS NEEDED FOR  INSOMINIA Patient taking differently: Take 0.25-0.5 mg by mouth at bedtime as needed (for insomnia).  11/11/18   Colon Branch, MD  amLODipine (NORVASC) 10 MG tablet Take 1 tablet (10 mg total) by mouth daily. 12/08/18   Colon Branch, MD  arformoterol (BROVANA) 15 MCG/2ML NEBU Take 2 mLs (15 mcg total) by nebulization in the morning and at bedtime. 08/16/19  Colon Branch, MD  aspirin EC 81 MG tablet Take 1 tablet (81 mg total) by mouth daily. 08/16/19   Colon Branch, MD  atorvastatin (LIPITOR) 40 MG tablet TAKE 1 TABLET BY MOUTH EVERYDAY AT BEDTIME Patient taking differently: Take 40 mg by mouth daily at 6 PM.  06/18/19   Colon Branch, MD  bismuth subsalicylate (PEPTO BISMOL) 262 MG chewable tablet Chew 524 mg by mouth as needed for indigestion or diarrhea or loose stools.    [provider]  budesonide (PULMICORT) 0.5 MG/2ML nebulizer solution Take 2 mLs (0.5 mg total) by nebulization daily. 03/26/19   Colon Branch, MD  Calcium Carbonate-Vitamin D (CALCIUM 500 + D) 500-125 MG-UNIT TABS Take 1 tablet by mouth at bedtime.    [provider]  cloNIDine (CATAPRES) 0.1 MG tablet Take 1 tablet (0.1 mg total) by mouth 3 (three) times daily as needed. 08/12/19   Colon Branch, MD  esomeprazole (NEXIUM) 40 MG capsule Take 1 capsule (40 mg total) by mouth daily as needed. 08/12/19   Colon Branch, MD  furosemide (LASIX) 20 MG tablet Take 1 tablet (20 mg total) by mouth daily. 08/16/19   Colon Branch, MD  isosorbide mononitrate (IMDUR) 30 MG 24 hr tablet TAKE 1 TABLET BY MOUTH EVERY DAY Patient taking differently: Take 30 mg by mouth daily.  12/21/18   Troy Sine, MD  magnesium oxide (MAG-OX) 400 MG tablet Take 1 tablet (400 mg total) by mouth daily. 03/15/19   Colon Branch, MD  methimazole (TAPAZOLE) 5 MG tablet TAKE 1 TABLET BY MOUTH 3 TIMES A WEEK Patient taking differently: Take 5 mg by mouth every Monday, Wednesday, and Friday.  09/23/18   Colon Branch, MD  metoprolol tartrate (LOPRESSOR) 50 MG tablet Take 1  tablet (50 mg total) by mouth 2 (two) times daily. 07/09/19   Colon Branch, MD  nitroGLYCERIN (NITROSTAT) 0.4 MG SL tablet Place 1 tablet (0.4 mg total) under the tongue every 5 (five) minutes x 3 doses as needed for chest pain. Patient not taking: Reported on 08/12/2019 05/25/19   Colon Branch, MD  potassium chloride (KLOR-CON M10) 10 MEQ tablet Take 1 tablet (10 mEq total) by mouth daily. 07/09/19   Colon Branch, MD  QUEtiapine (SEROQUEL) 25 MG tablet Take 1 tablet (25 mg total) by mouth at bedtime. 08/16/19   Colon Branch, MD    Allergies    Hydrocodone and Tramadol hcl  Review of Systems   Review of Systems  Unable to perform ROS: Dementia    Physical Exam Updated Vital Signs BP (!) 141/63   Pulse 77   Temp 97.6 F (36.4 C) (Oral)   Resp 18   Ht 5\' 2"  (1.575 m)   Wt 82.3 kg   SpO2 100%   BMI 33.19 kg/m   Physical Exam Vitals and nursing note reviewed.  Constitutional:      Appearance: She is well-developed.  HENT:     Head: Normocephalic and atraumatic.  Eyes:     General:        Right eye: No discharge.        Left eye: No discharge.     Comments: Haziness to left cornea, but pupil appears reactive to light. Conjunctival injection and mild chemosis noted.   Neck:     Vascular: No JVD.     Trachea: No tracheal deviation.  Cardiovascular:     Rate and Rhythm: Normal rate.  Pulses: Normal pulses.     Comments: 2+ radial and DP/PT pulses bilaterally, 1+ pitting edema to the bilateral lower extremities, no palpable cords, compartments are soft  Pulmonary:     Comments: Tachypneic, bibasilar crackles noted.  Speaking in short phrases. Abdominal:     General: There is no distension.     Palpations: Abdomen is soft.     Tenderness: There is no abdominal tenderness. There is no guarding or rebound.  Genitourinary:    Comments: Examination performed in the presence of a chaperone.  No frank rectal bleeding.  Moderate amount of black stool in the rectal vault, scant amount of  bright red blood on DRE. Musculoskeletal:     Cervical back: Neck supple.  Skin:    General: Skin is warm and dry.     Findings: No erythema.  Neurological:     Mental Status: She is alert.     Comments: Patient oriented to person and place but not time or events.  Good grip strength bilaterally.  No facial droop noted  Psychiatric:        Behavior: Behavior normal.     ED Results / Procedures / Treatments   Labs (all labs ordered are listed, but only abnormal results are displayed) Labs Reviewed  COMPREHENSIVE METABOLIC PANEL - Abnormal; Notable for the following components:      Result Value   Glucose, Bld 136 (*)    Creatinine, Ser 1.01 (*)    Calcium 8.6 (*)    Albumin 3.3 (*)    GFR calc non Af Amer 48 (*)    GFR calc Af Amer 55 (*)    All other components within normal limits  CBC WITH DIFFERENTIAL/PLATELET - Abnormal; Notable for the following components:   RBC 3.32 (*)    Hemoglobin 9.5 (*)    HCT 33.8 (*)    MCV 101.8 (*)    MCHC 28.1 (*)    RDW 16.5 (*)    Lymphs Abs 0.5 (*)    All other components within normal limits  BRAIN NATRIURETIC PEPTIDE - Abnormal; Notable for the following components:   B Natriuretic Peptide 713.6 (*)    All other components within normal limits  URINALYSIS, ROUTINE W REFLEX MICROSCOPIC - Abnormal; Notable for the following components:   APPearance HAZY (*)    Protein, ur 30 (*)    Leukocytes,Ua SMALL (*)    All other components within normal limits  CBG MONITORING, ED - Abnormal; Notable for the following components:   Glucose-Capillary 121 (*)    All other components within normal limits  POCT I-STAT EG7 - Abnormal; Notable for the following components:   Bicarbonate 30.1 (*)    Acid-Base Excess 5.0 (*)    Calcium, Ion 1.10 (*)    HCT 35.0 (*)    Hemoglobin 11.9 (*)    All other components within normal limits  CBG MONITORING, ED - Abnormal; Notable for the following components:   Glucose-Capillary 117 (*)    All other  components within normal limits  POC OCCULT BLOOD, ED - Abnormal; Notable for the following components:   Fecal Occult Bld POSITIVE (*)    All other components within normal limits  SARS CORONAVIRUS 2 (TAT 6-24 HRS)  LACTIC ACID, PLASMA  LACTIC ACID, PLASMA  POC SARS CORONAVIRUS 2 AG -  ED  TROPONIN I (HIGH SENSITIVITY)  TROPONIN I (HIGH SENSITIVITY)    EKG EKG Interpretation  Date/Time:  Tuesday August 24 2019 09:16:03 EDT Ventricular Rate:  74 PR Interval:    QRS Duration: 94 QT Interval:  442 QTC Calculation: 491 R Axis:   71 Text Interpretation: Sinus rhythm Ventricular premature complex Short PR interval Low voltage, precordial leads Borderline repolarization abnormality Borderline prolonged QT interval wavering baseline TECHNICALLY DIFFICULT Otherwise no significant change Confirmed by Deno Etienne [phone]) on 08/24/2019 9:19:11 AM   Radiology DG Chest Portable 1 [facility]  Result Date: 08/24/2019 CLINICAL DATA:  Shortness of breath. EXAM: PORTABLE CHEST 1 [facility] COMPARISON:  08/09/2019 FINDINGS: Right and left hemidiaphragm are obscured greater on the left than the right. Signs of median sternotomy and CABG with aortic valve replacement with stable cardiomediastinal contour enlargement. Linear areas of airspace disease as developed since the prior study in the right mid chest and in the left mid chest. Graded opacity at the right and left lung base. No significant skeletal findings aside from median sternotomy. IMPRESSION: Signs of bilateral effusions and basilar airspace disease with interval development of areas of atelectatic change in the mid chest and with worsening opacification in the lung bases particularly in the retrocardiac region. Electronically Signed   By: Zetta Bills M.D.   On: 08/24/2019 10:31    Procedures .Critical Care Performed by: Renita Papa, PA-C Authorized by: Renita Papa, PA-C   Critical care provider statement:    Critical care time (minutes):  45    Critical care was necessary to treat or prevent imminent or life-threatening deterioration of the following conditions:  Respiratory failure   Critical care was time spent personally by me on the following activities:  Discussions with consultants, evaluation of patient's response to treatment, examination of patient, ordering and performing treatments and interventions, ordering and review of laboratory studies, ordering and review of radiographic studies, pulse oximetry, re-evaluation of patient's condition, obtaining history from patient or surrogate and review of old charts   (including critical care time)  Medications Ordered in ED Medications  furosemide (LASIX) injection 40 mg (40 mg Intravenous Given 08/24/19 1134)    ED Course  I have reviewed the triage vital signs and the nursing notes.  Pertinent labs & imaging results that were available during my care of the patient were reviewed by me and considered in my medical decision making (see chart for details).    MDM Rules/Calculators/A&P                      NOHEMI MUSACCHIO was evaluated in Emergency Department on 08/24/2019 for the symptoms described in the history of present illness. She was evaluated in the context of the global COVID-19 pandemic, which necessitated consideration that the patient might be at risk for infection with the SARS-CoV-2 virus that causes COVID-19. Institutional protocols and algorithms that pertain to the evaluation of patients at risk for COVID-19 are in a state of rapid change based on information released by regulatory bodies including the CDC and federal and state organizations. These policies and algorithms were followed during the patient's care in the ED.  Patient brought in by EMS for evaluation of altered mental status.  She was found to be lethargic and hypoxic on EMS arrival.  She had some improvement in mental status with supplemental oxygen.  On my assessment she is confused but able to answer  questions and follow commands.  She denies any pain.  She is afebrile, vital signs are stable on supplemental oxygen but she is a little bit tachypneic.  She has heme positive stools on examination but no frank  rectal bleeding.  Her stools are black but she was recently given Pepto-Bismol by her daughter.  However on review of her CBC her H&H is stable compared to discharge from her most recent hospitalization.   Remainder of lab work reviewed and interpreted by myself shows no leukocytosis, no metabolic derangements.  Her BNP is elevated today and her chest x-ray shows evidence of bilateral effusions and bibasilar airspace disease.  In the absence of fever, cough, or leukocytosis we will hold on antibiotics at this time.  Point-of-care Covid test is negative.  She was given IV Lasix in the ED.  Spoke with Dr. Tamala Julian with [facility] hospitalist service who agrees to assume care of patient and bring her into the hospital for further evaluation and management. Patient seen and evaluated by Dr. Tyrone Nine who agrees with assessment and plan at this time.    Final Clinical Impression(s) / ED Diagnoses Final diagnoses:  Acute respiratory failure with hypoxia (HCC)  Acute on chronic congestive heart failure, unspecified heart failure type ([place])  Altered mental status, unspecified altered mental status type  Heme positive stool    Rx / DC Orders ED Discharge Orders    None       Renita Papa, PA-C 08/24/19 [place], [place], DO 08/24/19 1307

## 2019-08-24 NOTE — ED Notes (Signed)
Daughter at bedside.

## 2019-08-24 NOTE — H&P (Addendum)
History and Physical    [name] [id number] DOB: 1922-07-25 DOA: 08/24/2019  Referring MD/NP/PA: Manning Charity, PA-C PCP: Colon Branch, MD  Patient coming from: Home via EMS  Chief Complaint: Shortness of breath and chest pain  I have personally briefly reviewed patient's old medical records in [place]   HPI: Rebecca Skinner is a 86 y.o. female with medical history significant of hypertension, hyperlipidemia, CAD,  hypothyroidism, paroxysmal atrial fibrillation on aspirin, diabetes mellitus type 2, GI bleed, GERD and dementia.  She presents after complaining of shortness of breath and chest pain this morning.  History is obtained from the patient's daughter with whom she lives at home.  Daughter reports that last night patient was delusional and unable to recognize her or other family members which was unusual for her.  Recently admitted for symptomatic anemia on 3/1-3/2, where patient was transfused 1 unit packed red blood cells and ultimately discharged home to follow-up with GI.  During that hospitalization Plavix was stopped, but her daughter reports that she still having black dark stools.  She was scheduled to follow-up with Dr. Henrene Pastor today.  Apparently patient had previously been on furosemide 20 mg twice daily that was stopped during her last hospitalization, but her primary care provider had restarted 20 mg daily.  In route with EMS patient was noted to be hypoxic with O2 saturations down to 76% on room air and improved on 4 L of nasal cannula oxygen 96%.  ED Course: Upon admission into the emergency department and vital signs afebrile, heart rate 69-77, respirations 18-22, blood pressure stable, and O2 saturation maintained on 2 L nasal cannula oxygen.  Labs significant for hemoglobin 9.5, creatinine 1.01, glucose 136, BNP 713.6, and troponin 12.  Chest x-ray concerning for bilateral pleural effusions with basilar airspace disease.  Stool guaiacs were positive.  Patient was given 40 mg  of Lasix.  TRH called to admit.   Review of Systems  Unable to perform ROS: Dementia  Gastrointestinal: Positive for melena.    Past Medical History:  Diagnosis Date  . Anxiety 11/20/2011  . Asthma    UNDER THE CARE [facility] DR PAZ  . CAD (coronary artery disease)    s/p CABG s/p AO valve replacement ([place] [facility])  . CVA (cerebral infarction) 08-2013   multiple, L, d/t Afib, started eloquis  . Diabetes mellitus    type 2   . Dizziness    Chronic, admiet 07-2010,saw neuro, thought to be a peripheral issue   . Dry skin   . GERD (gastroesophageal reflux disease)   . Hyperlipidemia   . Hypertension   . Hyperthyroidism   . Keloid    @ chest  . Memory loss   . Osteoarthritis   . Osteopenia    per dexa 12/09  . Paroxysmal atrial fibrillation ([place])    cards d/c coumadin 12/2008 d/t persisten NSR and frequent falls- restarted coumadin july 2011, now on Eliquis  . Recurrent UTI   . Shortness of breath dyspnea   . TIA (transient ischemic attack)     Past Surgical History:  Procedure Laterality Date  . ABDOMINAL HYSTERECTOMY    . AORTIC VALVE REPLACEMENT  1998  . CARDIAC VALVE REPLACEMENT    . CAROTID DOPPLER  07/13/12   BILATERAL BULB/PROXIMAL ICAS;MILD AMOUNT OF FIBROUS PLAQUE WITH NO DIAMETER REDUCTION.  . CESAREAN SECTION     x 2  . COLONOSCOPY WITH PROPOFOL N/A 03/30/2014   Procedure: COLONOSCOPY WITH PROPOFOL;  Surgeon:  Irene Shipper, MD;  Location: [facility];  Service: Endoscopy;  Laterality: N/A;  . CORONARY ARTERY BYPASS GRAFT  1998   SVG TO RCA  . HEMORRHOID SURGERY    . JOINT REPLACEMENT    . MYOCARDIAL PERFUSION STUDY  12/19/09   NORMAL PATTERN OF PERFUSION IN ALL REGIONS.EF 75%.  . OOPHORECTOMY    . TOTAL KNEE ARTHROPLASTY  1999  . TRANSTHORACIC ECHOCARDIOGRAM  09/11/11   LVEF >55%.STAGE 1 DIASTOLIC DYSFUNCTION,ELEVATED LV FILLING PRESSURE.BIOPROSTHETIC AORTIC VALVE-PEAK AND MEAN GRADIENTS OF 17 MMHG AND 8 MMHG.SIGMOID SEPTUM.MILD TO MOD MR.MILD TO MOD TR.RVSP 46  MMHG.     reports that she has quit smoking. She has never used smokeless tobacco. She reports that she does not drink alcohol or use drugs.  Allergies  Allergen Reactions  . Hydrocodone Itching and Nausea Only  . Tramadol Hcl Itching and Nausea Only    Family History  Problem Relation Age of Onset  . Heart attack Father   . Coronary artery disease Son 44       deceased  . Hypertension Brother   . Stroke Brother   . Colon cancer Neg Hx   . Breast cancer Neg Hx   . Thyroid disease Neg Hx     Prior to Admission medications   Medication Sig Start Date End Date Taking? Authorizing Provider  acetaminophen (TYLENOL) 500 MG tablet Take 1,000 mg by mouth every 6 (six) hours as needed (for pain).     [provider]  albuterol (ACCUNEB) 0.63 MG/3ML nebulizer solution Take 3 mLs by nebulization every 6 (six) hours as needed for wheezing or shortness of breath. 05/25/19   Colon Branch, MD  ALPRAZolam Duanne Moron) 0.25 MG tablet TAKE 1/2 TABLET OR TAKE 1 TABLET BY MOUTH AT NIGHT AS NEEDED FOR INSOMINIA Patient taking differently: Take 0.25-0.5 mg by mouth at bedtime as needed (for insomnia).  11/11/18   Colon Branch, MD  amLODipine (NORVASC) 10 MG tablet Take 1 tablet (10 mg total) by mouth daily. 12/08/18   Colon Branch, MD  arformoterol (BROVANA) 15 MCG/2ML NEBU Take 2 mLs (15 mcg total) by nebulization in the morning and at bedtime. 08/16/19   Colon Branch, MD  aspirin EC 81 MG tablet Take 1 tablet (81 mg total) by mouth daily. 08/16/19   Colon Branch, MD  atorvastatin (LIPITOR) 40 MG tablet TAKE 1 TABLET BY MOUTH EVERYDAY AT BEDTIME Patient taking differently: Take 40 mg by mouth daily at 6 PM.  06/18/19   Colon Branch, MD  bismuth subsalicylate (PEPTO BISMOL) 262 MG chewable tablet Chew 524 mg by mouth as needed for indigestion or diarrhea or loose stools.    [provider]  budesonide (PULMICORT) 0.5 MG/2ML nebulizer solution Take 2 mLs (0.5 mg total) by nebulization daily. 03/26/19    Colon Branch, MD  Calcium Carbonate-Vitamin D (CALCIUM 500 + D) 500-125 MG-UNIT TABS Take 1 tablet by mouth at bedtime.    [provider]  cloNIDine (CATAPRES) 0.1 MG tablet Take 1 tablet (0.1 mg total) by mouth 3 (three) times daily as needed. 08/12/19   Colon Branch, MD  esomeprazole (NEXIUM) 40 MG capsule Take 1 capsule (40 mg total) by mouth daily as needed. 08/12/19   Colon Branch, MD  furosemide (LASIX) 20 MG tablet Take 1 tablet (20 mg total) by mouth daily. 08/16/19   Colon Branch, MD  isosorbide mononitrate (IMDUR) 30 MG 24 hr tablet TAKE 1 TABLET BY  MOUTH EVERY DAY Patient taking differently: Take 30 mg by mouth daily.  12/21/18   Troy Sine, MD  magnesium oxide (MAG-OX) 400 MG tablet Take 1 tablet (400 mg total) by mouth daily. 03/15/19   Colon Branch, MD  methimazole (TAPAZOLE) 5 MG tablet TAKE 1 TABLET BY MOUTH 3 TIMES A WEEK Patient taking differently: Take 5 mg by mouth every Monday, Wednesday, and Friday.  09/23/18   Colon Branch, MD  metoprolol tartrate (LOPRESSOR) 50 MG tablet Take 1 tablet (50 mg total) by mouth 2 (two) times daily. 07/09/19   Colon Branch, MD  nitroGLYCERIN (NITROSTAT) 0.4 MG SL tablet Place 1 tablet (0.4 mg total) under the tongue every 5 (five) minutes x 3 doses as needed for chest pain. Patient not taking: Reported on 08/12/2019 05/25/19   Colon Branch, MD  potassium chloride (KLOR-CON M10) 10 MEQ tablet Take 1 tablet (10 mEq total) by mouth daily. 07/09/19   Colon Branch, MD  QUEtiapine (SEROQUEL) 25 MG tablet Take 1 tablet (25 mg total) by mouth at bedtime. 08/16/19   Colon Branch, MD    Physical Exam:  Constitutional: Elderly female who appears to have removed her IV, clothes, and EKG leads. Vitals:   08/24/19 1130 08/24/19 1137 08/24/19 1145 08/24/19 1200  BP: 123/63  (!) 141/63   Pulse: 71 69 70 77  Resp: 20 (!) 21 19 18   Temp:      TempSrc:      SpO2: 98% 98% 92% 100%  Weight:      Height:       Eyes: PERRL, lids and conjunctivae normal ENMT: Mucous  membranes are dry. Posterior pharynx clear of any exudate or lesions.  Neck: normal, supple, no masses, no thyromegaly Respiratory: Positive crackles appreciated in the mid to lower lung fields.  Patient without any significant wheezing and currently on room air Cardiovascular: Regular rate and rhythm, no murmurs / rubs / gallops. No extremity edema. 2+ pedal pulses. No carotid bruits.  Abdomen: no tenderness, no masses palpated. No hepatosplenomegaly. Bowel sounds positive.  Musculoskeletal: no clubbing / cyanosis. No joint deformity upper and lower extremities. Good ROM, no contractures. Normal muscle tone.  Skin: no rashes, lesions, ulcers. No induration Neurologic: CN 2-12 grossly intact. Sensation intact, DTR normal. Strength 5/5 in all 4.  Psychiatric: Confused. Alert and oriented x self.  Anxious mood.     Labs on Admission: I have personally reviewed following labs and imaging studies  CBC: Recent Labs  Lab 08/24/19 0955  WBC 7.5  NEUTROABS 6.0  HGB 9.5*  11.9*  HCT 33.8*  35.0*  MCV 101.8*  PLT [id number]   Basic Metabolic Panel: Recent Labs  Lab 08/24/19 0955  NA 143  143  K 4.4  4.3  CL 105  CO2 28  GLUCOSE 136*  BUN 13  CREATININE 1.01*  CALCIUM 8.6*   GFR: Estimated Creatinine Clearance: 33.9 mL/min (A) (by C-G formula based on SCr of 1.01 mg/dL (H)). Liver Function Tests: Recent Labs  Lab 08/24/19 0955  AST 20  ALT 16  ALKPHOS 78  BILITOT 0.6  PROT 6.5  ALBUMIN 3.3*   No results for input(s): LIPASE, AMYLASE in the last 168 hours. No results for input(s): AMMONIA in the last 168 hours. Coagulation Profile: No results for input(s): INR, PROTIME in the last 168 hours. Cardiac Enzymes: No results for input(s): CKTOTAL, CKMB, CKMBINDEX, TROPONINI in the last 168 hours. BNP (last 3 results) No results for  input(s): PROBNP in the last 8760 hours. HbA1C: No results for input(s): HGBA1C in the last 72 hours. CBG: Recent Labs  Lab 08/24/19 0941  08/24/19 1007  GLUCAP 121* 117*   Lipid Profile: No results for input(s): CHOL, HDL, LDLCALC, TRIG, CHOLHDL, LDLDIRECT in the last 72 hours. Thyroid Function Tests: No results for input(s): TSH, T4TOTAL, FREET4, T3FREE, THYROIDAB in the last 72 hours. Anemia Panel: No results for input(s): VITAMINB12, FOLATE, FERRITIN, TIBC, IRON, RETICCTPCT in the last 72 hours. Urine analysis:    Component Value Date/Time   COLORURINE YELLOW 08/24/2019 1155   APPEARANCEUR HAZY (A) 08/24/2019 1155   LABSPEC 1.012 08/24/2019 1155   PHURINE 5.0 08/24/2019 1155   GLUCOSEU NEGATIVE 08/24/2019 1155   GLUCOSEU NEGATIVE 01/30/2017 1229   HGBUR NEGATIVE 08/24/2019 1155   HGBUR negative 12/18/2009 0937   BILIRUBINUR NEGATIVE 08/24/2019 1155   BILIRUBINUR Neg 11/20/2011 1458   KETONESUR NEGATIVE 08/24/2019 1155   PROTEINUR 30 (A) 08/24/2019 1155   UROBILINOGEN 0.2 01/30/2017 1229   NITRITE NEGATIVE 08/24/2019 1155   LEUKOCYTESUR SMALL (A) 08/24/2019 1155   Sepsis Labs: No results found for this or any previous visit (from the past 240 hour(s)).   Radiological Exams on Admission: DG Chest Portable 1 View  Result Date: 08/24/2019 CLINICAL DATA:  Shortness of breath. EXAM: PORTABLE CHEST 1 VIEW COMPARISON:  08/09/2019 FINDINGS: Right and left hemidiaphragm are obscured greater on the left than the right. Signs of median sternotomy and CABG with aortic valve replacement with stable cardiomediastinal contour enlargement. Linear areas of airspace disease as developed since the prior study in the right mid chest and in the left mid chest. Graded opacity at the right and left lung base. No significant skeletal findings aside from median sternotomy. IMPRESSION: Signs of bilateral effusions and basilar airspace disease with interval development of areas of atelectatic change in the mid chest and with worsening opacification in the lung bases particularly in the retrocardiac region. Electronically Signed   By:  Zetta Bills M.D.   On: 08/24/2019 10:31    EKG: Independently reviewed.  Sinus rhythm at 74 bpm with QTc 491  Assessment/Plan Acute respiratory failure with hypoxia secondary to diastolic congestive heart failure exacerbation: Patient presents with complaints of acute onset of shortness of breath with O2 saturations noted to be as low as 76% on room air improved with nasal cannula oxygen.  Daughter notes that Lasix dose had recently been changed and she follows in the outpatient setting with cardiology. Labs significant for elevated BNP of 713.6.  Chest x-ray concerning for bilateral pleural effusions with bibasilar airspace disease.  Last EF noted to be 60-65% in 01/2019.  She follows with Dr. Claiborne Billings of cardiology in the outpatient setting.  Patient having important grams of Lasix x1 dose with adequate urine output. -Admit to a medical telemetry beds -Continuous pulse oximetry with nasal cannula oxygen maintain O2 saturation greater than 92% -Strict intake and output and daily weight -Lasix 20 mg IV twice daily -Check echocardiogram -Message sent for cardiology to eval in a.m. given recent changes in Lasix  Macrocytic anemia, GI bleed with melena: Acute on chronic.  Patient reportedly has still been having dark stools and was scheduled to follow-up with Dr. Henrene Pastor today.  Hemoglobin 9.5 g/dL with elevated MCV, but appears to be similar to previous discharge on 3/8 of 9 g/dL after being transfused 1 unit of packed red blood cells. -Continue to monitor H&H -Transfuse blood products as needed for hemoglobin less than 8 g/dL -  May consider formally consulting Dr. Henrene Pastor of GI if bleeding persist  Hyperthyroidism: Patient on Tapazole 5 mg on on M/W/F.  Last free T4 0.75 on 12/14/2018. -Check free T4   Dementia with behavioral disturbance -Sitter if needed -Safety mittens -Continue Seroquel at night  Paroxysmal atrial fibrillation: Patient had previously been on Plavix and aspirin, but during last  hospitalization Plavix was discontinued due to bleeding and she was recommended to continue daily aspirin.   -Continue aspirin for now as H&H appears stable  Coronary artery disease: Patient with previous history of coronary artery bypass -Continue  Essential hypertension: Blood pressures initially noted to be elevated up to 173/82.  Home blood pressure medications include metoprolol 50 mg twice daily, isosorbide mononitrate 30 mg daily, amlodipine 10 mg daily, and furosemide 20 mg daily.  -Continue home blood pressure medications except for p.o. Lasix  Diabetes mellitus type 2: Patient appears to be diet controlled.  Last hemoglobin A1c 5.9 on 3/2. -Heart healthy and carb modified diet  Chronic kidney disease stage IIIa: Creatinine 1.01 with GFR  55% which appears to be around patient's baseline. -Continue to monitor  GERD -Pharmacy substitution of Protonix scheduled daily  DNR: Present on admission.  Palliative care is already arranged to follow-up with the patient and family on 3/19.   DVT prophylaxis: SCD Code Status: DNR Family Communication: Discussed plan of care with the daughter at bedside Disposition Plan: Possible discharge home in 2 to 3 days once respiratory Consults called: Message sent for cardiology to eval in a.m. Admission status: Inpatient  Norval Morton MD [facility] Pager [phone]   If 7PM-7AM, please contact night-coverage [email or web address] Password [facility]  08/24/2019, 12:52 PM

## 2019-08-24 NOTE — ED Triage Notes (Signed)
Pt from home via ems; family originally called out for  CP; lethargic on arrival; O2 sats 76% RA on arrival, not following commands; pt placed on 4L [place], 96%; GCS 12 on arrival, now 14; follows commands, no neuro deficits; hx afib, CHF ; pt recently stopped Plavix due to recent GI bleed; pt alert to self and place at this time; denies CP at this time  144/81 98% 4L HR 71 afib CBG 221 RR 20

## 2019-08-25 ENCOUNTER — Other Ambulatory Visit: Payer: Self-pay | Admitting: *Deleted

## 2019-08-25 ENCOUNTER — Inpatient Hospital Stay (HOSPITAL_COMMUNITY): Payer: Medicare Other

## 2019-08-25 DIAGNOSIS — J9601 Acute respiratory failure with hypoxia: Secondary | ICD-10-CM

## 2019-08-25 DIAGNOSIS — D539 Nutritional anemia, unspecified: Secondary | ICD-10-CM

## 2019-08-25 DIAGNOSIS — E0590 Thyrotoxicosis, unspecified without thyrotoxic crisis or storm: Secondary | ICD-10-CM

## 2019-08-25 DIAGNOSIS — I5031 Acute diastolic (congestive) heart failure: Secondary | ICD-10-CM

## 2019-08-25 DIAGNOSIS — I5033 Acute on chronic diastolic (congestive) heart failure: Secondary | ICD-10-CM

## 2019-08-25 DIAGNOSIS — E1159 Type 2 diabetes mellitus with other circulatory complications: Secondary | ICD-10-CM

## 2019-08-25 DIAGNOSIS — I480 Paroxysmal atrial fibrillation: Secondary | ICD-10-CM

## 2019-08-25 DIAGNOSIS — I10 Essential (primary) hypertension: Secondary | ICD-10-CM

## 2019-08-25 DIAGNOSIS — K921 Melena: Secondary | ICD-10-CM

## 2019-08-25 DIAGNOSIS — I4811 Longstanding persistent atrial fibrillation: Secondary | ICD-10-CM

## 2019-08-25 LAB — CBC WITH DIFFERENTIAL/PLATELET
Abs Immature Granulocytes: 0.03 10*3/uL (ref 0.00–0.07)
Basophils Absolute: 0 10*3/uL (ref 0.0–0.1)
Basophils Relative: 0 %
Eosinophils Absolute: 0.1 10*3/uL (ref 0.0–0.5)
Eosinophils Relative: 1 %
HCT: 33.8 % — ABNORMAL LOW (ref 36.0–46.0)
Hemoglobin: 9.5 g/dL — ABNORMAL LOW (ref 12.0–15.0)
Immature Granulocytes: 0 %
Lymphocytes Relative: 6 %
Lymphs Abs: 0.5 10*3/uL — ABNORMAL LOW (ref 0.7–4.0)
MCH: 28.6 pg (ref 26.0–34.0)
MCHC: 28.1 g/dL — ABNORMAL LOW (ref 30.0–36.0)
MCV: 101.8 fL — ABNORMAL HIGH (ref 80.0–100.0)
Monocytes Absolute: 0.9 10*3/uL (ref 0.1–1.0)
Monocytes Relative: 12 %
Neutro Abs: 6 10*3/uL (ref 1.7–7.7)
Neutrophils Relative %: 81 %
Platelets: 172 10*3/uL (ref 150–400)
RBC: 3.32 MIL/uL — ABNORMAL LOW (ref 3.87–5.11)
RDW: 16.5 % — ABNORMAL HIGH (ref 11.5–15.5)
WBC: 7.5 10*3/uL (ref 4.0–10.5)
nRBC: 0 % (ref 0.0–0.2)

## 2019-08-25 LAB — T4, FREE: Free T4: 0.83 ng/dL (ref 0.61–1.12)

## 2019-08-25 LAB — BASIC METABOLIC PANEL
Anion gap: 13 (ref 5–15)
BUN: 13 mg/dL (ref 8–23)
CO2: 28 mmol/L (ref 22–32)
Calcium: 8.3 mg/dL — ABNORMAL LOW (ref 8.9–10.3)
Chloride: 101 mmol/L (ref 98–111)
Creatinine, Ser: 1.02 mg/dL — ABNORMAL HIGH (ref 0.44–1.00)
GFR calc Af Amer: 55 mL/min — ABNORMAL LOW (ref >60)
GFR calc non Af Amer: 47 mL/min — ABNORMAL LOW (ref >60)
Glucose, Bld: 105 mg/dL — ABNORMAL HIGH (ref 70–99)
Potassium: 4.4 mmol/L (ref 3.5–5.1)
Sodium: 142 mmol/L (ref 135–145)

## 2019-08-25 LAB — CBC
HCT: 33 % — ABNORMAL LOW (ref 36.0–46.0)
Hemoglobin: 9.4 g/dL — ABNORMAL LOW (ref 12.0–15.0)
MCH: 29.3 pg (ref 26.0–34.0)
MCHC: 28.5 g/dL — ABNORMAL LOW (ref 30.0–36.0)
MCV: 102.8 fL — ABNORMAL HIGH (ref 80.0–100.0)
Platelets: 144 10*3/uL — ABNORMAL LOW (ref 150–400)
RBC: 3.21 MIL/uL — ABNORMAL LOW (ref 3.87–5.11)
RDW: 16.4 % — ABNORMAL HIGH (ref 11.5–15.5)
WBC: 7.8 10*3/uL (ref 4.0–10.5)
nRBC: 0 % (ref 0.0–0.2)

## 2019-08-25 LAB — MAGNESIUM: Magnesium: 1.8 mg/dL (ref 1.7–2.4)

## 2019-08-25 LAB — ECHOCARDIOGRAM COMPLETE
Height: 62 in
Weight: 2917.13 oz

## 2019-08-25 MED ORDER — PERFLUTREN LIPID MICROSPHERE
1.0000 mL | INTRAVENOUS | Status: AC | PRN
  Administered 2019-08-25: 2 mL via INTRAVENOUS
  Filled 2019-08-25: qty 10

## 2019-08-25 MED ORDER — ORAL CARE MOUTH RINSE
15.0000 mL | Freq: Two times a day (BID) | OROMUCOSAL | Status: DC
  Administered 2019-08-26 – 2019-08-30 (×7): 15 mL via OROMUCOSAL

## 2019-08-25 MED ORDER — FUROSEMIDE 10 MG/ML IJ SOLN
40.0000 mg | Freq: Two times a day (BID) | INTRAMUSCULAR | Status: DC
  Administered 2019-08-25 – 2019-08-28 (×6): 40 mg via INTRAVENOUS
  Filled 2019-08-25 (×8): qty 4

## 2019-08-25 NOTE — Consult Note (Signed)
   [facility] Inpatient Consult   08/25/2019  Rebecca Skinner 08/04/1922 SG:[id number]   Patient is currently active with [facility] for chronic disease management services.  Patient has been engaged by a [place] for post hospital t.  Our community based plan of care has focused on disease management and community resource support.    Patient will receive a post hospital call and will be evaluated for assessments and disease process education, if returning home.    Plan: Follow up with  Inpatient Transition Of Care [TOC] team member to make aware that [facility] following.  Will follow disposition for needs and follow up appropriately.  Of note, [facility] Care Management services does not replace or interfere with any services that are needed or arranged by inpatient [facility] care management team.  For additional questions or referrals please contact:   Natividad Brood, RN BSN [facility] Liaison  [phone] business mobile phone Toll free office [phone]  Fax number: [phone] [email or web address]

## 2019-08-25 NOTE — Patient Outreach (Signed)
[facility]) Care Management  08/25/2019  CASONDRA VANDERHYDE 1923/03/29 HR:[id number]   [facility] Hospitalization  Referral Date:12/03/2018 Referral Source:[employer] High Risk Screening Reason for Referral:Disease Management Education Insurance:[employer] Medicare   Outreach Attempt:  Received notification patient hospitalized at [facility] for heart failure exacerbation.  RN Health Coach will make [facility] Liaison aware of patient's admission.  Plan:  RN Health Coach will await recommendations from [place] for discharge recommendation follow up.  [place] [phone] [email or web address]

## 2019-08-25 NOTE — Progress Notes (Signed)
  Echocardiogram 2D Echocardiogram with definity has been performed.  Darlina Sicilian M 08/25/2019, 10:03 AM

## 2019-08-25 NOTE — Progress Notes (Signed)
Patient had a 2.14 second cardiac pause. MD made aware. Patient seen sleeping in bed.

## 2019-08-25 NOTE — Progress Notes (Signed)
[facility]  PROGRESS NOTE  Rebecca Skinner [id number] DOB: 1923/05/23 DOA: 08/24/2019 PCP: Colon Branch, MD   Brief HPI:   86 year old female with a history of hypertension, hyperlipidemia, CAD, hypothyroidism, paroxysmal atrial fibrillation on aspirin, diabetes mellitus type 2, GI bleed, GERD, dementia who presented to ED with complaints of shortness of breath and chest pain.  Patient has dementia, history was obtained from patient's daughter.  Patient became delusional at home and was unable to recognize her family members which was unusual for her.  She was recently mated for symptomatic anemia on 08/09/2019 2 08/09/2020 at that time she was transfused 1 unit PRBC and was discharged home to follow-up with GI.  At that time Plavix was stopped.  Patient has been still having black stools.  She was supposed to follow-up with GI yesterday.  She has been on furosemide 20 mg twice a day.  In route to the ED she was found to be hypoxic with O2 sats down to 76% on room air and improved on 5 L of oxygen via nasal cannula to 96%. Upon admission the ED she was found to be requiring 2 L/min of oxygen via nasal cannula.  BNP 713.6.  Troponin 12.0.  Chest x-ray concerning for bilateral pleural effusion and basilar airspace disease.  Stool guaiac was positive.  She was given Lasix 40 mg IV in the ED.    Subjective   Patient seen and examined, she is somnolent, but arousable.  No behavior disturbance.   Assessment/Plan:     1. Acute respiratory failure with hypoxia-secondary to diastolic congestive heart failure.  Patient presented with complaints of acute onset of shortness of breath with oxygen saturation noted to be as low as 76% on room air, it improved with oxygen via nasal cannula.  As per daughter Lasix dose was recently changed per cardiology.  BNP was elevated some on 3.6.  Chest x-ray showed bilateral pleural effusion bibasilar airspace disease.  Last EF was 60-65% in August 2020.  Patient has been  started on low-dose Lasix 20 mg IV every 12 hours.  Echocardiogram has been ordered.  Will consult cardiology for further recommendations.  Net -1.15L  2. GI bleed with melena-patient was supposed to follow-up with GI as outpatient but was mated to the hospital.  Her previous hemoglobin on 08/16/2019 was 9 g/dL.  Today hemoglobin is stable at 9.4 g/dL.  We will keep a close watch and transfuse as needed for hemoglobin less than 7.  As long as hemoglobin remains stable, she can follow-up with GI as outpatient.  If she continues to drop hemoglobin precipitously, will obtain formal GI consultation while inpatient.  3. Hyperthyroidism-continue Tapazole 5 mg on Monday/Wednesday/Friday.  Last T4 was 0.75 on 12/14/2018.Free T4 obtained yesterday, 0.83.  4. Dementia with behavior disturbance-safety mittens, continue Seroquel at bedtime.  5. Paroxysmal atrial fibrillation-patient has been on Plavix and aspirin, but during last hospitalization Plavix was discontinued due to bleeding and she was recommended to continue with aspirin.  Continue with aspirin at this time.  6. CAD-s/p CABG, stable.  7. Hypertension-blood pressure stable, continue metoprolol, amlodipine, furosemide.  8. Diabetes mellitus type 2-diet controlled.  Last A1c was 5.9 on 08/10/2019.  9. CODE STATUS-patient is DNR, palliative care has been already involved and they are going to follow-up with patient and the family on 08/27/2019 as outpatient.     SpO2: 99 % O2 Flow Rate (L/min): 2 L/min   COVID-19 Labs  No results for input(s): DDIMER, FERRITIN,  LDH, CRP in the last 72 hours.  Lab Results  Component Value Date   SARSCOV2NAA NEGATIVE 08/24/2019   [place] NEGATIVE 08/09/2019   [place] NEGATIVE 05/22/2019     CBG: Recent Labs  Lab 08/24/19 0941 08/24/19 1007  GLUCAP 121* 117*    CBC: Recent Labs  Lab 08/24/19 0955 08/25/19 0518  WBC 7.5 7.8  NEUTROABS 6.0  --   HGB 9.5*  11.9* 9.4*  HCT 33.8*  35.0* 33.0*   MCV 101.8* 102.8*  PLT 172 144*    Basic Metabolic Panel: Recent Labs  Lab 08/24/19 0955 08/25/19 0518  NA 143  143 142  K 4.4  4.3 4.4  CL 105 101  CO2 28 28  GLUCOSE 136* 105*  BUN 13 13  CREATININE 1.01* 1.02*  CALCIUM 8.6* 8.3*  MG  --  1.8     Liver Function Tests: Recent Labs  Lab 08/24/19 0955  AST 20  ALT 16  ALKPHOS 78  BILITOT 0.6  PROT 6.5  ALBUMIN 3.3*        DVT prophylaxis: SCDs  Code Status: DNR  Family Communication: No family at bedside  Disposition Plan: Patient admitted with acute respiratory failure with hypoxia due to diastolic heart failure.  Started on IV diuretics.  Cardiology has been consulted.  Barrier to discharge- still requiring oxygen, hypoxic.  Continue to diurese as tolerated.         Scheduled medications:  . amLODipine  10 mg Oral Daily  . aspirin EC  81 mg Oral Daily  . atorvastatin  40 mg Oral q1800  . budesonide  0.5 mg Nebulization Daily  . furosemide  20 mg Intravenous BID  . isosorbide mononitrate  30 mg Oral Daily  . magnesium oxide  400 mg Oral Daily  . mouth rinse  15 mL Mouth Rinse BID  . methimazole  5 mg Oral Q M,W,F  . metoprolol tartrate  50 mg Oral BID  . pantoprazole  40 mg Oral Daily  . potassium chloride  10 mEq Oral Daily  . QUEtiapine  25 mg Oral QHS  . sodium chloride flush  3 mL Intravenous Q12H    Consultants:    Procedures:    Antibiotics:   Anti-infectives (From admission, onward)   None       Objective   Vitals:   08/25/19 0529 08/25/19 0800 08/25/19 0811 08/25/19 1249  BP: 132/65 (!) 155/63  122/61  Pulse: 74 65  70  Resp: 19 18  18   Temp: 97.9 F (36.6 C) 98 F (36.7 C)  97.8 F (36.6 C)  TempSrc: Oral Oral  Oral  SpO2: 100% 98% 99% 99%  Weight: 82.7 kg     Height:        Intake/Output Summary (Last 24 hours) at 08/25/2019 1356 Last data filed at 08/25/2019 1130 Gross per 24 hour  Intake 3 ml  Output 1150 ml  Net -1147 ml    03/15 1901 - 03/17  0700 In: 0  Out: 450 [Urine:450]  Filed Weights   08/24/19 0918 08/25/19 0529  Weight: 82.3 kg 82.7 kg    Physical Examination:    General: Somnolent, but arousable  Cardiovascular: S1-S2, regular, no murmur auscultated  Respiratory: Decreased breath sounds bilaterally  Abdomen: Abdomen is soft, nontender, no organomegaly  Extremities: Trace edema in the lower extremities  Neurologic: Somnolent, arousable    Data Reviewed:   Recent Results (from the past 240 hour(s))  SARS CORONAVIRUS 2 (TAT 6-24 HRS) Nasopharyngeal Nasopharyngeal  Swab     Status: None   Collection Time: 08/24/19 12:55 PM   Specimen: Nasopharyngeal Swab  Result Value Ref Range Status   SARS Coronavirus 2 NEGATIVE NEGATIVE Final    Comment: (NOTE) SARS-CoV-2 target nucleic acids are NOT DETECTED. The SARS-CoV-2 RNA is generally detectable in upper and lower respiratory specimens during the acute phase of infection. Negative results do not preclude SARS-CoV-2 infection, do not rule out co-infections with other pathogens, and should not be used as the sole basis for treatment or other patient management decisions. Negative results must be combined with clinical observations, patient history, and epidemiological information. The expected result is Negative. Fact Sheet for Patients: SugarRoll.be Fact Sheet for Healthcare Providers: https://www.woods-mathews.com/ This test is not yet approved or cleared by the [place] FDA and  has been authorized for detection and/or diagnosis of SARS-CoV-2 by FDA under an Emergency Use Authorization (EUA). This EUA will remain  in effect (meaning this test can be used) for the duration of the COVID-19 declaration under Section 56 4(b)(1) of the Act, 21 U.S.C. section 360bbb-3(b)(1), unless the authorization is terminated or revoked sooner. Performed at [facility], [place] [address], [place], [place] [address]      No results for input(s): LIPASE, AMYLASE in the last 168 hours. No results for input(s): AMMONIA in the last 168 hours.  Cardiac Enzymes: No results for input(s): CKTOTAL, CKMB, CKMBINDEX, TROPONINI in the last 168 hours. BNP (last 3 results) Recent Labs    05/22/19 2200 08/24/19 0955  BNP 698.3* 713.6*    ProBNP (last 3 results) No results for input(s): PROBNP in the last 8760 hours.  Studies:  DG Chest Portable 1 View  Result Date: 08/24/2019 CLINICAL DATA:  Shortness of breath. EXAM: PORTABLE CHEST 1 VIEW COMPARISON:  08/09/2019 FINDINGS: Right and left hemidiaphragm are obscured greater on the left than the right. Signs of median sternotomy and CABG with aortic valve replacement with stable cardiomediastinal contour enlargement. Linear areas of airspace disease as developed since the prior study in the right mid chest and in the left mid chest. Graded opacity at the right and left lung base. No significant skeletal findings aside from median sternotomy. IMPRESSION: Signs of bilateral effusions and basilar airspace disease with interval development of areas of atelectatic change in the mid chest and with worsening opacification in the lung bases particularly in the retrocardiac region. Electronically Signed   By: Zetta Bills M.D.   On: 08/24/2019 10:31   ECHOCARDIOGRAM COMPLETE  Result Date: 08/25/2019    ECHOCARDIOGRAM REPORT   Patient Name:   JERALDINE CLENDENNING Date of Exam: 08/25/2019 Medical Rec #:  [id number]   Height:       62.0 in Accession #:    [id number]  Weight:       182.3 lb Date of Birth:  [birth year]    BSA:          1.838 m Patient Age:    43 years    BP:           155/63 mmHg Patient Gender: F           HR:           65 bpm. Exam Location:  Inpatient Procedure: 2D Echo and Intracardiac Opacification Agent Indications:    CHF-Acute Diastolic [id number] / [id number]  History:        Patient has prior history of Echocardiogram examinations, most  recent 01/27/2019. CAD,  Pacemaker and Prior CABG, TIA and Stroke,                 Arrythmias:Atrial Fibrillation and sick sinus syndrome; Risk                 Factors:Hypertension, Dyslipidemia and Diabetes. GERD. Dementia.                 Aortic Valve: unknown pericardial valve is present in the aortic                 position. Procedure Date: 1998.  Sonographer:    Darlina Sicilian RDCS Referring Phys: [id number] RONDELL A SMITH  Sonographer Comments: Definity attempted, nevery returned to the heart, nurse made aware. IMPRESSIONS  1. Left ventricular ejection fraction, by estimation, is 60 to 65%. The left ventricle has normal function. The left ventricle has no regional wall motion abnormalities. There is moderate left ventricular hypertrophy. Left ventricular diastolic parameters are indeterminate. There is the interventricular septum is flattened in systole and diastole, consistent with right ventricular pressure and volume overload.  2. Right ventricular systolic function is mildly reduced. The right ventricular size is severely enlarged. There is moderately elevated pulmonary artery systolic pressure. The estimated right ventricular systolic pressure is 123456 mmHg.  3. Left atrial size was mildly dilated.  4. Right atrial size was severely dilated.  5. The mitral valve is normal in structure. Trivial mitral valve regurgitation.  6. Tricuspid valve regurgitation is moderate to severe.  7. The aortic valve has been repaired/replaced. Aortic valve regurgitation is trivial. There is a bioprosthetic valve present in the aortic position. Normal bioprosthetic function. Mean gradient 74mmHg, EOA 2.1 cm^2, DI 0.62  8. Aortic dilatation noted. There is mild dilatation of the ascending aorta measuring 37 mm.  9. The inferior vena cava is dilated in size with <50% respiratory variability, suggesting right atrial pressure of 15 mmHg. FINDINGS  Left Ventricle: Left ventricular ejection fraction, by estimation, is 60 to 65%. The left ventricle has normal  function. The left ventricle has no regional wall motion abnormalities. Definity contrast agent was given IV to delineate the left ventricular  endocardial borders. The left ventricular internal cavity size was small. There is moderate left ventricular hypertrophy. The interventricular septum is flattened in systole and diastole, consistent with right ventricular pressure and volume overload. Left ventricular diastolic parameters are indeterminate. Right Ventricle: The right ventricular size is severely enlarged. Right vetricular wall thickness was not assessed. Right ventricular systolic function is mildly reduced. There is moderately elevated pulmonary artery systolic pressure. The tricuspid regurgitant velocity is 3.67 m/s, and with an assumed right atrial pressure of 15 mmHg, the estimated right ventricular systolic pressure is 123456 mmHg. Left Atrium: Left atrial size was mildly dilated. Right Atrium: Right atrial size was severely dilated. Pericardium: Trivial pericardial effusion is present. Mitral Valve: The mitral valve is normal in structure. Trivial mitral valve regurgitation. MV peak gradient, 13.5 mmHg. The mean mitral valve gradient is 4.5 mmHg. Tricuspid Valve: The tricuspid valve is normal in structure. Tricuspid valve regurgitation is moderate to severe. Aortic Valve: The aortic valve has been repaired/replaced. Aortic valve regurgitation is trivial. Aortic valve mean gradient measures 9.0 mmHg. Aortic valve peak gradient measures 18.9 mmHg. Aortic valve area, by VTI measures 1.92 cm. There is a unknown  pericardial valve present in the aortic position. Procedure Date: 1998. Pulmonic Valve: The pulmonic valve was grossly normal. Pulmonic valve regurgitation is mild. Aorta: Aortic dilatation noted. There  is mild dilatation of the ascending aorta measuring 37 mm. Venous: The inferior vena cava is dilated in size with less than 50% respiratory variability, suggesting right atrial pressure of 15 mmHg.  IAS/Shunts: The interatrial septum was not well visualized.  LEFT VENTRICLE PLAX 2D LVIDd:         3.30 cm LVIDs:         2.80 cm LV PW:         1.00 cm LV IVS:        1.20 cm LVOT diam:     1.97 cm LV SV:         77 LV SV Index:   42 LVOT Area:     3.04 cm  LEFT ATRIUM           Index       RIGHT ATRIUM           Index LA diam:      3.70 cm 2.01 cm/m  RA Area:     31.30 cm LA Vol (A4C): 72.4 ml 39.39 ml/m RA Volume:   128.00 ml 69.64 ml/m  AORTIC VALVE AV Area (Vmax):    1.82 cm AV Area (Vmean):   1.69 cm AV Area (VTI):     1.92 cm AV Vmax:           217.50 cm/s AV Vmean:          141.500 cm/s AV VTI:            0.403 m AV Peak Grad:      18.9 mmHg AV Mean Grad:      9.0 mmHg LVOT Vmax:         130.00 cm/s LVOT Vmean:        78.800 cm/s LVOT VTI:          0.255 m LVOT/AV VTI ratio: 0.63  AORTA Ao Root diam: 3.00 cm Ao Asc diam:  3.70 cm MITRAL VALVE                TRICUSPID VALVE MV Area (PHT): 2.69 cm     TR Peak grad:   53.9 mmHg MV Peak grad:  13.5 mmHg    TR Vmax:        367.00 cm/s MV Mean grad:  4.5 mmHg MV Vmax:       1.84 m/s     SHUNTS MV Vmean:      97.6 cm/s    Systemic VTI:  0.26 m MV Decel Time: 282 msec     Systemic Diam: 1.97 cm MV E velocity: 173.50 cm/s Oswaldo Milian MD Electronically signed by Oswaldo Milian MD Signature Date/Time: 08/25/2019/1:29:11 PM    Final      Admission status: The appropriate admission status for this patient is INPATIENT. Inpatient status is judged to be reasonable and necessary in order to provide the required intensity of service to ensure the patient's safety. The patient's presenting symptoms, physical exam findings, and initial radiographic and laboratory data in the context of their chronic comorbidities is felt to place them at high risk for further clinical deterioration. Furthermore, it is not anticipated that the patient will be medically stable for discharge from the hospital within 2 midnights of admission. The following factors  support the admission status of inpatient.     The patient's presenting symptoms include hypoxia, shortness of breath The worrisome physical exam findings include hypoxia, requiring 2 L/min of oxygen. The initial radiographic and laboratory data are worrisome because of  bilateral pleural effusion, BNP elevated 716. The chronic co-morbidities include CHF, history of GI bleed, dementia, hypertension, CAD.       * I certify that at the point of admission it is my clinical judgment that the patient will require inpatient hospital care spanning beyond 2 midnights from the point of admission due to high intensity of service, high risk for further deterioration and high frequency of surveillance required.Oswald Hillock   [facility] If 7PM-7AM, please contact night-coverage at [email or web address], Office  [phone]   08/25/2019, 1:56 PM  LOS: 1 day

## 2019-08-25 NOTE — Progress Notes (Signed)
Daughter, [name], updated at bedside.

## 2019-08-25 NOTE — Consult Note (Addendum)
Cardiology Consultation:   Patient ID: Rebecca Skinner MRN: [id number]; DOB: [birth year]  Admit date: 08/24/2019 Date of Consult: 08/25/2019  Primary Care Provider: Colon Branch, MD Primary Cardiologist: Rebecca Majestic, MD  Primary Electrophysiologist:  None   Patient Profile:   Rebecca Skinner is a 86 y.o. female with a hx of paroxysmal atrial fibrillation on aspirin, hyperlipidemia, CAD s/p CABG X1 and bioprosthetic AVR 1998, multiple CVAs,  hypothyroidism, diabetes type 2, GI bleed, GERD and dementia who is being seen today for the evaluation of CHF at the request of Rebecca Skinner.  History of Present Illness:   Rebecca Skinner the past medical history as above.  We have been asked to see the patient today for CHF with volume overload.  She has a history of lower GI bleed in 2015 with colonoscopy revealing AVM in the cecum area. She also has a history of CVA in 2015 after which Coumadin was switched to Eliquis, another episode of TIA in 08/2016 after which aspirin was switched to Eliquis again.   Last echocardiogram 01/27/2019 showed EF 60-65%, mild LVH, mild to moderate LAE, moderate RAE, moderate to severe TR and normal aortic valve prosthetic function.  She was seen 02/09/2019 with concern for volume overload and 10 pound weight gain.  She had been on steroid therapy for a clear productive cough.  Her Lasix was initially increased to twice a day and at follow-up continued at 40 mg in the morning and 20 mg as needed in the afternoon.  She was stable at her last office visit on 04/22/2019.  She was on Eliquis at that time for stroke risk reduction.  She was continuing to maintain atrial fibrillation, rate controlled on metoprolol.  She was admitted 08/09/2019 with concern for GI bleed.  She was having dark stools with some associated shortness of breath on exertion.  Hemoccults were positive.  GI recommended to continue to monitor and she was to follow-up in the office, which was scheduled for yesterday.  She was not  seen as she had come to the hospital.  Her Eliquis was discontinued during this hospitalization.  Her benazepril was also put on hold and Lasix was changed to as needed.  She was continued on amlodipine and nitrate.  The patient presented to the hospital yesterday with complaints of shortness of breath and chest pain.  The patient lives with her daughter and internal medicine H&P indicates that the daughter provided most of the information as the patient has memory issues.  The daughter reported that on the prior evening the patient became delusional and unable to recognize her family members, which was unusual for her.  EMS was called.  She reported that the patient had recently been restarted on Lasix 20 mg daily by her PCP.   EMS had noted the patient to be hypoxic with O2 saturations down to 76% on room air.  In the ED the patient was maintaining adequate O2 saturation on 2 L of oxygen.  Hemoglobin was 9.5.  Creatinine was 1.01.  BNP was elevated at 713.6 and high-sensitivity troponin was 12.  Stool was guaiac positive. Chest x-ray showed bilateral effusions and basilar airspace disease.  No pleural effusions were noted on last chest x-ray on 3/1.  The patient was given Lasix IV. She has had only modest urine output. Wt is up 1 pound over night.   On my exam the patient is resting in bed, moaning softly. She is unaware of where she is or circumstance. She  thought she was at home in [place]. She complains of abdominal pain. She says yes when asked about chest pain. She winces with abdominal and chest palpation. She denies difficulty breathing. She has no edema. She has mild JVD and few crackles in the lungs.   Past Medical History:  Diagnosis Date  . Anxiety 11/20/2011  . Asthma    UNDER THE CARE [facility] DR [place]  . CAD (coronary artery disease)    s/p CABG s/p AO valve replacement ([place])  . CVA (cerebral infarction) 08-2013   multiple, L, d/t Afib, started eloquis  . Diabetes mellitus     type 2   . Dizziness    Chronic, admiet 07-2010,saw neuro, thought to be a peripheral issue   . Dry skin   . GERD (gastroesophageal reflux disease)   . Hyperlipidemia   . Hypertension   . Hyperthyroidism   . Keloid    @ chest  . Memory loss   . Osteoarthritis   . Osteopenia    per dexa 12/09  . Paroxysmal atrial fibrillation ([place])    cards d/c coumadin 12/2008 d/t persisten NSR and frequent falls- restarted coumadin july 2011, now on Eliquis  . Recurrent UTI   . Shortness of breath dyspnea   . TIA (transient ischemic attack)     Past Surgical History:  Procedure Laterality Date  . ABDOMINAL HYSTERECTOMY    . AORTIC VALVE REPLACEMENT  1998  . CARDIAC VALVE REPLACEMENT    . CAROTID DOPPLER  07/13/12   BILATERAL BULB/PROXIMAL ICAS;MILD AMOUNT OF FIBROUS PLAQUE WITH NO DIAMETER REDUCTION.  . CESAREAN SECTION     x 2  . COLONOSCOPY WITH PROPOFOL N/A 03/30/2014   Procedure: COLONOSCOPY WITH PROPOFOL;  Surgeon: Rebecca Shipper, MD;  Location: [facility];  Service: Endoscopy;  Laterality: N/A;  . CORONARY ARTERY BYPASS GRAFT  1998   SVG TO RCA  . HEMORRHOID SURGERY    . JOINT REPLACEMENT    . MYOCARDIAL PERFUSION STUDY  12/19/09   NORMAL PATTERN OF PERFUSION IN ALL REGIONS.EF 75%.  . OOPHORECTOMY    . TOTAL KNEE ARTHROPLASTY  1999  . TRANSTHORACIC ECHOCARDIOGRAM  09/11/11   LVEF >55%.STAGE 1 DIASTOLIC DYSFUNCTION,ELEVATED LV FILLING PRESSURE.BIOPROSTHETIC AORTIC VALVE-PEAK AND MEAN GRADIENTS OF 17 MMHG AND 8 MMHG.SIGMOID SEPTUM.MILD TO MOD MR.MILD TO MOD TR.RVSP 46 MMHG.     Home Medications:  Prior to Admission medications   Medication Sig Start Date End Date Taking? Authorizing Provider  acetaminophen (TYLENOL) 500 MG tablet Take 1,000 mg by mouth every 6 (six) hours as needed (for pain).    Yes [provider]  albuterol (ACCUNEB) 0.63 MG/3ML nebulizer solution Take 3 mLs by nebulization every 6 (six) hours as needed for wheezing or shortness of breath. 05/25/19  Yes  Rebecca [place], Rebecca Berthold, MD  amLODipine (NORVASC) 10 MG tablet Take 1 tablet (10 mg total) by mouth daily. 12/08/18  Yes Rebecca Branch, MD  aspirin EC 81 MG tablet Take 1 tablet (81 mg total) by mouth daily. 08/16/19  Yes [place], Rebecca Berthold, MD  atorvastatin (LIPITOR) 40 MG tablet TAKE 1 TABLET BY MOUTH EVERYDAY AT BEDTIME Patient taking differently: Take 40 mg by mouth daily at 6 PM.  06/18/19  Yes [place], Rebecca Berthold, MD  bismuth subsalicylate (PEPTO BISMOL) 262 MG chewable tablet Chew 524 mg by mouth as needed for indigestion or diarrhea or loose stools.   Yes [provider]  budesonide (PULMICORT) 0.5 MG/2ML nebulizer solution Take 2 mLs (0.5  mg total) by nebulization daily. 03/26/19  Yes Rebecca Skinner, Rebecca Berthold, MD  Calcium Carbonate-Vitamin D (CALCIUM 500 + D) 500-125 MG-UNIT TABS Take 1 tablet by mouth at bedtime.   Yes [provider]  cloNIDine (CATAPRES) 0.1 MG tablet Take 1 tablet (0.1 mg total) by mouth 3 (three) times daily as needed. 08/12/19  Yes [place]inner, Rebecca Berthold, MD  esomeprazole (NEXIUM) 40 MG capsule Take 1 capsule (40 mg total) by mouth daily as needed. 08/12/19  Yes [place], Rebecca Berthold, MD  furosemide (LASIX) 20 MG tablet Take 1 tablet (20 mg total) by mouth daily. 08/16/19  Yes [place], Rebecca Berthold, MD  isosorbide mononitrate (IMDUR) 30 MG 24 hr tablet TAKE 1 TABLET BY MOUTH EVERY DAY Patient taking differently: Take 30 mg by mouth daily.  12/21/18  Yes Troy Sine, MD  magnesium oxide (MAG-OX) 400 MG tablet Take 1 tablet (400 mg total) by mouth daily. 03/15/19  Yes [place], Rebecca Berthold, MD  methimazole (TAPAZOLE) 5 MG tablet TAKE 1 TABLET BY MOUTH 3 TIMES A WEEK Patient taking differently: Take 5 mg by mouth every Monday, Wednesday, and Friday.  09/23/18  Yes [place], Rebecca Berthold, MD  metoprolol tartrate (LOPRESSOR) 50 MG tablet Take 1 tablet (50 mg total) by mouth 2 (two) times daily. 07/09/19  Yes [place], Rebecca Berthold, MD  nitroGLYCERIN (NITROSTAT) 0.4 MG SL tablet Place 1 tablet (0.4 mg total) under the tongue every 5 (five) minutes x 3 doses as needed  for chest pain. 05/25/19  Yes [place], Rebecca Berthold, MD  potassium chloride (KLOR-CON M10) 10 MEQ tablet Take 1 tablet (10 mEq total) by mouth daily. 07/09/19  Yes [place], Rebecca Berthold, MD  QUEtiapine (SEROQUEL) 25 MG tablet Take 1 tablet (25 mg total) by mouth at bedtime. 08/16/19  Yes Rebecca Branch, MD    Inpatient Medications: Scheduled Meds: . amLODipine  10 mg Oral Daily  . aspirin EC  81 mg Oral Daily  . atorvastatin  40 mg Oral q1800  . budesonide  0.5 mg Nebulization Daily  . furosemide  20 mg Intravenous BID  . isosorbide mononitrate  30 mg Oral Daily  . magnesium oxide  400 mg Oral Daily  . mouth rinse  15 mL Mouth Rinse BID  . methimazole  5 mg Oral Q M,W,F  . metoprolol tartrate  50 mg Oral BID  . pantoprazole  40 mg Oral Daily  . potassium chloride  10 mEq Oral Daily  . QUEtiapine  25 mg Oral QHS  . sodium chloride flush  3 mL Intravenous Q12H   Continuous Infusions: . sodium chloride     PRN Meds: sodium chloride, acetaminophen, albuterol, cloNIDine, nitroGLYCERIN, ondansetron (ZOFRAN) IV, sodium chloride flush  Allergies:    Allergies  Allergen Reactions  . Hydrocodone Itching and Nausea Only  . Tramadol Hcl Itching and Nausea Only    Social History:   Social History   Socioeconomic History  . Marital status: Divorced    Spouse name: Not on file  . Number of children: 4  . Years of education: Not on file  . Highest education level: Not on file  Occupational History  . Occupation: retired     Fish farm manager: RETIRED  Tobacco Use  . Smoking status: Former Research scientist (life sciences)  . Smokeless tobacco: Never Used  . Tobacco comment: quit 2004, smoked 1.5 ppd  Substance and Sexual Activity  . Alcohol use: No    Alcohol/week: 0.0 standard drinks  . Drug use: No  . Sexual activity: Not Currently  Other Topics Concern  . Not on file  Social History Narrative   Had to moved w/ [name]  ~ 01-2016   Had 3 daughter- 2 son  (lost oldest daughter and son), 2 living daughters in [place]  Determinants of Health   Financial Resource Strain: Low Risk   . Difficulty of Paying Living Expenses: Not hard at all  Food Insecurity: No Food Insecurity  . Worried About Charity fundraiser in the Last Year: Never true  . Ran Out of Food in the Last Year: Never true  Transportation Needs: No Transportation Needs  . Lack of Transportation (Medical): No  . Lack of Transportation (Non-Medical): No  Physical Activity: Inactive  . Days of Exercise per Week: 0 days  . Minutes of Exercise per Session: 0 min  Stress:   . Feeling of Stress :   Social Connections:   . Frequency of Communication with Friends and Family:   . Frequency of Social Gatherings with Friends and Family:   . Attends Religious Services:   . Active Member of Clubs or Organizations:   . Attends Archivist Meetings:   Marland Kitchen Marital Status:   Intimate Partner Violence:   . Fear of Current or Ex-Partner:   . Emotionally Abused:   [place] Kitchen Physically Abused:   . Sexually Abused:     Family History:    Family History  Problem Relation Age of Onset  . Heart attack Father   . Coronary artery disease Son 15       deceased  . Hypertension Brother   . Stroke Brother   . Rebecca cancer Neg Hx   . Breast cancer Neg Hx   . Thyroid disease Neg Hx      ROS:  Please see the history of present illness.   All other ROS reviewed and negative.     Physical Exam/Data:   Vitals:   08/25/19 0529 08/25/19 0800 08/25/19 0811 08/25/19 1249  BP: 132/65 (!) 155/63  122/61  Pulse: 74 65  70  Resp: 19 18  18   Temp: 97.9 F (36.6 C) 98 F (36.7 C)  97.8 F (36.6 C)  TempSrc: Oral Oral  Oral  SpO2: 100% 98% 99% 99%  Weight: 82.7 kg     Height:        Intake/Output Summary (Last 24 hours) at 08/25/2019 1308 Last data filed at 08/25/2019 1039 Gross per 24 hour  Intake 3 ml  Output 450 ml  Net -447 ml   Last 3 Weights 08/25/2019 08/24/2019 08/16/2019  Weight (lbs) 182 lb 5.1 oz 181 lb 7 oz 181 lb 6 oz  Weight (kg) 82.7 kg  82.3 kg 82.271 kg     Body mass index is 33.35 kg/m.  General:  Frail elderly female, in no acute distress, moaning softly HEENT: Left eye sclera is injected. Neck: mild JVD Endocrine:  No thryomegaly Vascular: No carotid bruits; FA pulses 2+ bilaterally without bruits  Cardiac:  normal S1, S2; irregularly irregular rhythm; no murmur  Lungs:  Few scattered crackles  Abd: soft, tender  Ext: no edema Musculoskeletal:  No deformities Skin: warm and dry  Neuro:  Pt is alert, oriented to self only, not very conversant Psych:  Somewhat withdrawn affect   EKG:  The EKG was personally reviewed and demonstrates:  Probably atrila fibrillation, very poor tracing Telemetry:  Telemetry was personally reviewed and demonstrates:  afib in the 60's-80's  Relevant [place] Studies:  Echo pending  Echocardiogram 01/27/2019  IMPRESSIONS  1. The left ventricle has normal systolic function with an ejection  fraction of 60-65%. The cavity size was small. There is mild concentric  left ventricular hypertrophy. Indeterminate diastolic function due to  atrial fibrillation. There is right  ventricular volume overload.  2. The right ventricle has mildly reduced systolic function. The cavity  was severely enlarged. There is no increase in right ventricular wall  thickness.  3. Left atrial size was mild-moderately dilated.  4. Right atrial size was moderately dilated.  5. The mitral valve is grossly normal.  6. The tricuspid valve is grossly normal. Tricuspid valve regurgitation  is moderate-severe.  7. A pericardial bioprosthesis valve is present in the aortic position.  Normal aortic valve prosthesis.  8. A bioprosthetic valve is present in the aortic position. The peak/mean  gradients are 13.8/8.4 mmHG. AVA by continuity is 1.73 cm2. This is a  normally functioning prosthesis.  9. The aorta is normal unless otherwise noted.  10. When compared to the prior study: RV more dilated compared with  prior  study from 2019. Prosthetic valve function is stable.   Laboratory Data:  High Sensitivity Troponin:   Recent Labs  Lab 08/24/19 0955 08/24/19 1153  TROPONINIHS 12 11     Chemistry Recent Labs  Lab 08/24/19 0955 08/25/19 0518  NA 143  143 142  K 4.4  4.3 4.4  CL 105 101  CO2 28 28  GLUCOSE 136* 105*  BUN 13 13  CREATININE 1.01* 1.02*  CALCIUM 8.6* 8.3*  GFRNONAA 48* 47*  GFRAA 55* 55*  ANIONGAP 10 13    Recent Labs  Lab 08/24/19 0955  PROT 6.5  ALBUMIN 3.3*  AST 20  ALT 16  ALKPHOS 78  BILITOT 0.6   Hematology Recent Labs  Lab 08/24/19 0955 08/25/19 0518  WBC 7.5 7.8  RBC 3.32* 3.21*  HGB 9.5*  11.9* 9.4*  HCT 33.8*  35.0* 33.0*  MCV 101.8* 102.8*  MCH 28.6 29.3  MCHC 28.1* 28.5*  RDW 16.5* 16.4*  PLT 172 144*   BNP Recent Labs  Lab 08/24/19 0955  BNP 713.6*    DDimer No results for input(s): DDIMER in the last 168 hours.   Radiology/Studies:  DG Chest Portable 1 View  Result Date: 08/24/2019 CLINICAL DATA:  Shortness of breath. EXAM: PORTABLE CHEST 1 VIEW COMPARISON:  08/09/2019 FINDINGS: Right and left hemidiaphragm are obscured greater on the left than the right. Signs of median sternotomy and CABG with aortic valve replacement with stable cardiomediastinal contour enlargement. Linear areas of airspace disease as developed since the prior study in the right mid chest and in the left mid chest. Graded opacity at the right and left lung base. No significant skeletal findings aside from median sternotomy. IMPRESSION: Signs of bilateral effusions and basilar airspace disease with interval development of areas of atelectatic change in the mid chest and with worsening opacification in the lung bases particularly in the retrocardiac region. Electronically Signed   By: Zetta Bills M.D.   On: 08/24/2019 10:31         Assessment and Plan:   Acute on chronic diastolic heart failure with hypoxia -Patient presented with increased shortness  of breath, hypoxia. -Echo on 01/27/2019 showed EF 60-65% with mild concentric LVH, indeterminate diastolic function due to being in atrial fibrillation. -Updated echocardiogram is pending -BNP is elevated at 713.6.  High-sensitivity troponins are not elevated. -Chest x-ray shows bilateral effusions and basilar airspace disease.  No pleural effusions were noted on  last chest x-ray on 3/1. -Patient was given IV Lasix 40 mg and then 20 mg yesterday.  She continues on 20 mg IV twice daily. -Currently fluid status is hard to estimate. She has no edema. Few crackle in the lungs. Mild JVD. Pt is still on oxygen, denies shortness of breath.  -Continue to diurese. She needs about 6 pounds more off. Will increase lasix to 40 mg BID.  -Strict I&O and daily weights.   Anemia/GI bleed -Pt admitted with GI bleed 08/09/19. Anticoagulation stopped. Continues to have reported dark stools. FOBT positive.  -Hgb is relatively stable at 9.4 -IM plans to monitor with serial H&H. They are considering consulting GI if bleeding persists.  -On my exam she is complaining of abdominal pain. Further work up by primary team.   Hypertension -Patient continues on home amlodipine 10 mg, Imdur 30 mg, metoprolol tartrate 50 mg twice daily -BP mostly controlled, occ elevated to 140's-150's. 122/61 today.  -Conitnue current therapy.   Persistent atrial fibrillation -Rate controlled on home metoprolol 50 mg BID.  -anticoagulation was discontinued during hospital admission for recurrent GI bleed early this month. She is now on aspirin 81 mg.  History of bioprosthetic aortic valve -Noted to be normally functioning by echo and 01/2019.  Hyperthyroidism -Patient continues on home methimazole. Free T4 0.83.      For questions or updates, please contact [place] Please consult [email or web address] for contact info under     Signed, Daune Perch, NP  08/25/2019 1:08 PM   I have seen and examined the patient along with  Daune Perch, NP .  I have reviewed the chart, notes and new data.  I agree with PA/NP's note.  Key new complaints: She appears to be short of breath, although it is hard to get any real review of systems.  Attempts to wean off oxygen lead to prompt desaturation. Key examination changes: Reduced breath sounds and crackles in both bases.  JVP appears to be elevated at least 7-8 cm above the sternal angle.  Has compression devices on her calf so unable to evaluate for dependent edema.  Overall clear evidence of volume overload. Key new findings / data: Echo performed today again confirms normal left ventricular systolic function and normal gradients across the aortic valve bioprosthesis.  There is severe pulmonary hypertension with evidence of right heart decompensation.  PLAN: She has biventricular heart failure with acute decompensation.  Clear evidence of hypervolemia by physical exam and x-ray. Severe pulmonary artery hypertension, with near-systemic systolic PA pressure. Increase intravenous diuretics, furosemide 40 mg twice daily at least. Atrial fibrillation rate control is adequate.  This is probably a permanent arrhythmia at this time (all tracings going back to 2018 showed atrial fibrillation). Note discrepancy between relatively low voltage on ECG and the reported moderate LVH. It is quite possible that she has cardiac amyloidosis, but I think she is beyond the point where treatment with tafamidis would make a difference. Unfortunately she has both very high embolic risk and increased risk of recurrent bleeding complications.  I think at least at this point it is better to keep her off the anticoagulation. However, there must be an additional mechanism for her anemia due to marked macrocytosis in the presence of iron deficiency.  B12 and folate levels previously documented as normal, suggesting intrinsic bone marrow disorder such as myelodysplasia. Sanda Klein, MD, [place] [phone] 08/25/2019, 2:46 PM

## 2019-08-25 NOTE — Evaluation (Signed)
Physical Therapy Evaluation Patient Details Name: Rebecca Skinner MRN: [id number] DOB: Oct 18, 1922 Today's Date: 08/25/2019   History of Present Illness  Pt is a 86 yo female presenting with SOB and chest pain. Upon arrival pt hypoxic on RA with O2 sats in 70s, dx with CHF exacerbation. The pt has been reccently admitted for GIB (3/1-3/2) and symptomatic anemia. PMH includes: HTN, HLD< CAD, hypothyroidism, afib on aspirin, DM II, GERD, an dementia.  Clinical Impression  Pt in bed upon arrival of PT, agreeable to evaluation at this time. Prior to admission the pt was ambulating with a rollator and assist from daughter for activities such as bathing and dressing. The pt now presents with limitations in functional mobility, power, strength, and endurance due to above dx, and will continue to benefit from skilled PT to address these deficits. The pt was able to demo a short ambulation with 2L O2, a RW, and minG/minA for RW management. The pt repeatedly sat on bed or chair with impulsively quick movements but was unable to explain why (likely fatigue), while all vital signs remained stable. The pt will continue to benefit from skilled PT to improve endurance and functional independence prior to d/c home.      Follow Up Recommendations Home health PT;Supervision/Assistance - 24 hour    Equipment Recommendations  None recommended by PT(pt has RW for mobility)    Recommendations for Other Services       Precautions / Restrictions Precautions Precautions: Fall Precaution Comments: watch O2 Restrictions Weight Bearing Restrictions: No      Mobility  Bed Mobility Overal bed mobility: Needs Assistance Bed Mobility: Supine to Sit     Supine to sit: Min assist     General bed mobility comments: minA with continued VCs and facilitation of movement  Transfers Overall transfer level: Needs assistance Equipment used: Rolling walker (2 wheeled) Transfers: Sit to/from Stand Sit to Stand: Mod  assist;From elevated surface         General transfer comment: pt with poor power up and eccentric lower, needing modA to come to full stand and VCs for hand placement  Ambulation/Gait Ambulation/Gait assistance: Min assist Gait Distance (Feet): 40 Feet Assistive device: Rolling walker (2 wheeled) Gait Pattern/deviations: Step-to pattern;Decreased stride length;Shuffle;Trunk flexed   Gait velocity interpretation: <1.31 ft/sec, indicative of household ambulator General Gait Details: pt with short, shuffling steps, continually stopping and impuslively sitting on bed/chair despite not reportign fatigue and VSS. pt responds well to encouragement of daughter, attempt chair follow in future  Stairs            Wheelchair Mobility    Modified Rankin (Stroke Patients Only)       Balance Overall balance assessment: Needs assistance Sitting-balance support: Bilateral upper extremity supported Sitting balance-Leahy Scale: Fair Sitting balance - Comments: BUE supported, supervision     Standing balance-Leahy Scale: Poor Standing balance comment: pt requirs BUE support and close guard for safety, impulsive with mobility without explanation                             Pertinent Vitals/Pain Pain Assessment: No/denies pain    Home Living Family/patient expects to be discharged to:: Private residence Living Arrangements: Children(pt lives with her daughter) Available Help at Discharge: Family;Available 24 hours/day Type of Home: House Home Access: Level entry     Home Layout: One level Home Equipment: Clinical cytogeneticist - 4 wheels;Grab bars - tub/shower;Cane - quad Additional Comments: daughter  provided the background information    Prior Function Level of Independence: Needs assistance   Gait / Transfers Assistance Needed: uses rollator  ADL's / Homemaking Assistance Needed: daughter does all ADLs, pt can dress herself but oftedn daughter helps to  facilitate  Comments: pt has life alert system     Hand Dominance   Dominant Hand: Right    Extremity/Trunk Assessment   Upper Extremity Assessment Upper Extremity Assessment: Generalized weakness    Lower Extremity Assessment Lower Extremity Assessment: Generalized weakness    Cervical / Trunk Assessment Cervical / Trunk Assessment: Kyphotic  Communication      Cognition Arousal/Alertness: Awake/alert Behavior During Therapy: WFL for tasks assessed/performed;Flat affect Overall Cognitive Status: History of cognitive impairments - at baseline                                 General Comments: Daughter present, pt with sig dementia, daughter performs most home care and ADLs      General Comments General comments (skin integrity, edema, etc.): SpO2 maintained 92% and above on 2L o2, pt with increased WOB and wheezing    Exercises     Assessment/Plan    PT Assessment Patient needs continued PT services  PT Problem List Decreased strength;Decreased mobility;Decreased safety awareness;Decreased coordination;Decreased activity tolerance;Decreased cognition;Decreased balance;Cardiopulmonary status limiting activity       PT Treatment Interventions DME instruction;Therapeutic exercise;Gait training;Balance training;Neuromuscular re-education;Functional mobility training;Cognitive remediation;Therapeutic activities;Patient/family education    PT Goals (Current goals can be found in the Care Plan section)  Acute Rehab PT Goals Patient Stated Goal: none stated, daughter is hopeful pt will return home PT Goal Formulation: With patient/family Time For Goal Achievement: 09/08/19 Potential to Achieve Goals: Good    Frequency Min 3X/week   Barriers to discharge        Co-evaluation               AM-PAC PT "6 Clicks" Mobility  Outcome Measure Help needed turning from your back to your side while in a flat bed without using bedrails?: A Little Help  needed moving from lying on your back to sitting on the side of a flat bed without using bedrails?: A Little Help needed moving to and from a bed to a chair (including a wheelchair)?: A Little Help needed standing up from a chair using your arms (e.g., wheelchair or bedside chair)?: A Lot Help needed to walk in hospital room?: A Little Help needed climbing 3-5 steps with a railing? : A Lot 6 Click Score: 16    End of Session Equipment Utilized During Treatment: Gait belt;Oxygen Activity Tolerance: Patient limited by fatigue Patient left: in chair;with family/visitor present;with chair alarm set;with call bell/phone within reach Nurse Communication: Mobility status PT Visit Diagnosis: Unsteadiness on feet (R26.81);Difficulty in walking, not elsewhere classified (R26.2)    Time: [id number] PT Time Calculation (min) (ACUTE ONLY): 47 min   Charges:   PT Evaluation $PT Eval Moderate Complexity: 1 Mod PT Treatments $Gait Training: 23-37 mins        Karma Ganja, PT, DPT   Acute Rehabilitation Department Pager #: [phone]  Otho Bellows 08/25/2019, 3:58 PM

## 2019-08-25 NOTE — TOC Initial Note (Addendum)
Transition of Care [facility]) - Initial/Assessment Note    Patient Details  Name: Rebecca Skinner MRN: [id number] Date of Birth: 07/07/1922  Transition of [facility]) CM/SW Contact:    Zenon Mayo, RN Phone Number: 08/25/2019, 4:54 PM  Clinical Narrative:                 From home with daughter, NCM asked if she would like to be set up with [facility] for CHF disease management, daughter states yes, she also has [facility] for palliative services.  She uses a walker at home.  TOC will continue to follow for dc needs.  Will need HHRN , HHPT orders prior to dc.  PT eval rec HHPT also, Craig will add HHPT to services.   Expected Discharge Plan: [place] Barriers to Discharge: Continued Medical Work up   Patient Goals and CMS Choice Patient states their goals for this hospitalization and ongoing recovery are:: get better CMS Medicare.gov Compare Post Acute Care list provided to:: Patient Represenative (must comment)(daughter , [name])    Expected Discharge Plan and Services Expected Discharge Plan: [place]   Discharge Planning Services: CM Consult Post Acute Care Choice: [place] arrangements for the past 2 months: Single Family Home                 DME Arranged: (NA)         HH Arranged: RN [place] Agency: [place] ([place]) Date [place]: 08/25/19 Time [place]: [phone] Representative spoke with at [place]: [name]  Prior Living Arrangements/Services Living arrangements for the past 2 months: [place] with:: Adult Children Patient language and need for interpreter reviewed:: Yes        Need for Family Participation in Patient Care: Yes (Comment) Care giver support system in place?: Yes (comment) Current home services: DME(has a walker) Criminal Activity/Legal Involvement Pertinent to Current Situation/Hospitalization: No - Comment as needed  Activities of Daily Living Home Assistive  Devices/Equipment: Dentures (specify type), Walker (specify type) ADL Screening (condition at time of admission) Patient's cognitive ability adequate to safely complete daily activities?: No Is the patient deaf or have difficulty hearing?: No Does the patient have difficulty seeing, even when wearing glasses/contacts?: No Does the patient have difficulty concentrating, remembering, or making decisions?: Yes Patient able to express need for assistance with ADLs?: Yes Does the patient have difficulty dressing or bathing?: Yes Independently performs ADLs?: No Communication: Independent Dressing (OT): Needs assistance Is this a change from baseline?: Pre-admission baseline Grooming: Needs assistance Is this a change from baseline?: Pre-admission baseline Feeding: Needs assistance Is this a change from baseline?: Change from baseline, expected to last <3 days Bathing: Needs assistance Is this a change from baseline?: Pre-admission baseline Toileting: Independent with device (comment)(walker) In/Out Bed: Needs assistance Is this a change from baseline?: Pre-admission baseline Walks in Home: Independent with device (comment)(walker) Does the patient have difficulty walking or climbing stairs?: Yes Weakness of Legs: Both Weakness of Arms/Hands: None  Permission Sought/Granted                  Emotional Assessment Appearance:: Appears stated age Attitude/Demeanor/Rapport: Engaged Affect (typically observed): Appropriate Orientation: : Oriented to Self, Oriented to Place, Oriented to  Time, Oriented to Situation Alcohol / Substance Use: Not Applicable Psych Involvement: No (comment)  Admission diagnosis:  Heme positive stool [R19.5] Acute exacerbation of CHF (congestive heart failure) ([place]) [I50.9] Acute respiratory failure with  hypoxia ([place]) [J96.01] Altered mental status, unspecified altered mental status type [R41.82] Acute on chronic congestive heart failure, unspecified heart  failure type [facility]) [I50.9] Patient Active Problem List   Diagnosis Date Noted  . Acute exacerbation of CHF (congestive heart failure) ([place]) 08/24/2019  . DNR (do not resuscitate) 08/24/2019  . Melena 08/09/2019  . Stroke-like symptom   . Hypokalemia 05/22/2019  . Chest pain 06/21/2017  . TIA (transient ischemic attack) 08/30/2016  . Acute kidney injury ([place]) 03/05/2016  . Exposure keratitis 09/23/2015  . Keratopathy, band 09/13/2015  . PCP NOTES >>>>> 04/05/2015  . Chronic diastolic heart failure ([place]) 12/29/2014  . Edema 12/20/2014  . Dyspnea 12/20/2014  . Angiectasia 03/30/2014  . GI bleed 03/28/2014  . Dementia with behavioral disturbance ([place]) 09/16/2013  . Annual physical exam 11/21/2011  . Macrocytic anemia 07/17/2010  . ATRIAL FIBRILLATION, chronic 03/05/2007  . Diabetes mellitus with circulatory complication ([place]) [id number]  . S/P AVR (aortic valve replacement) 10/15/2006  . Hyperthyroidism 09/03/2006  . Dyslipidemia 09/03/2006  . HTN (hypertension) 09/03/2006  . CAD (coronary artery disease) of artery bypass graft 09/03/2006  . Asthma, chronic 09/03/2006   PCP:  Colon Branch, MD Pharmacy:   [id number] - [place], [place] [address] Phone: [phone] Fax: [phone]     Social Determinants of Health (SDOH) Interventions    Readmission Risk Interventions No flowsheet data found.

## 2019-08-26 ENCOUNTER — Other Ambulatory Visit: Payer: Self-pay

## 2019-08-26 LAB — CBC
HCT: 31.1 % — ABNORMAL LOW (ref 36.0–46.0)
Hemoglobin: 9.3 g/dL — ABNORMAL LOW (ref 12.0–15.0)
MCH: 29.2 pg (ref 26.0–34.0)
MCHC: 29.9 g/dL — ABNORMAL LOW (ref 30.0–36.0)
MCV: 97.8 fL (ref 80.0–100.0)
Platelets: 135 10*3/uL — ABNORMAL LOW (ref 150–400)
RBC: 3.18 MIL/uL — ABNORMAL LOW (ref 3.87–5.11)
RDW: 16.3 % — ABNORMAL HIGH (ref 11.5–15.5)
WBC: 7.9 10*3/uL (ref 4.0–10.5)
nRBC: 0 % (ref 0.0–0.2)

## 2019-08-26 LAB — BASIC METABOLIC PANEL
Anion gap: 14 (ref 5–15)
BUN: 15 mg/dL (ref 8–23)
CO2: 34 mmol/L — ABNORMAL HIGH (ref 22–32)
Calcium: 8.5 mg/dL — ABNORMAL LOW (ref 8.9–10.3)
Chloride: 95 mmol/L — ABNORMAL LOW (ref 98–111)
Creatinine, Ser: 1.31 mg/dL — ABNORMAL HIGH (ref 0.44–1.00)
GFR calc Af Amer: 40 mL/min — ABNORMAL LOW (ref >60)
GFR calc non Af Amer: 35 mL/min — ABNORMAL LOW (ref >60)
Glucose, Bld: 117 mg/dL — ABNORMAL HIGH (ref 70–99)
Potassium: 4.3 mmol/L (ref 3.5–5.1)
Sodium: 143 mmol/L (ref 135–145)

## 2019-08-26 NOTE — Progress Notes (Addendum)
Progress Note  Patient Name: Rebecca Skinner Date of Encounter: 08/26/2019  Primary Cardiologist: Shelva Majestic, MD   Subjective   Patient drowsy, winces from palpation in her abdomen. Nursing positioned HOB flat and her WOB did increase.  Inpatient Medications    Scheduled Meds: . amLODipine  10 mg Oral Daily  . aspirin EC  81 mg Oral Daily  . atorvastatin  40 mg Oral q1800  . budesonide  0.5 mg Nebulization Daily  . furosemide  40 mg Intravenous BID  . isosorbide mononitrate  30 mg Oral Daily  . magnesium oxide  400 mg Oral Daily  . mouth rinse  15 mL Mouth Rinse BID  . methimazole  5 mg Oral Q M,W,F  . metoprolol tartrate  50 mg Oral BID  . pantoprazole  40 mg Oral Daily  . potassium chloride  10 mEq Oral Daily  . QUEtiapine  25 mg Oral QHS  . sodium chloride flush  3 mL Intravenous Q12H   Continuous Infusions: . sodium chloride     PRN Meds: sodium chloride, acetaminophen, albuterol, cloNIDine, nitroGLYCERIN, ondansetron (ZOFRAN) IV, sodium chloride flush   Vital Signs    Vitals:   08/26/19 0117 08/26/19 0350 08/26/19 0720 08/26/19 0841  BP:  (!) 158/65 (!) 117/53   Pulse:  90 75 87  Resp:  20 18 18   Temp:  99.4 F (37.4 C) 98.5 F (36.9 C)   TempSrc:  Oral Oral   SpO2:  92% 100% 98%  Weight: 83.5 kg     Height:        Intake/Output Summary (Last 24 hours) at 08/26/2019 0904 Last data filed at 08/26/2019 0003 Gross per 24 hour  Intake 483 ml  Output 1650 ml  Net -1167 ml   Last 3 Weights 08/26/2019 08/25/2019 08/24/2019  Weight (lbs) 184 lb 182 lb 5.1 oz 181 lb 7 oz  Weight (kg) 83.462 kg 82.7 kg 82.3 kg      Telemetry    Sinus rhythm in the 70-80s, PVCs - Personally Reviewed  ECG    No new tracings - Personally Reviewed  Physical Exam   GEN: elderly female, drowsy   Neck: No JVD Cardiac: RRR, soft systolic murmur.  Respiratory: respirations unlabored, coarse sounds throughout GI: Soft, nontender, non-distended  MS: No edema; No  deformity. Neuro:  Nonfocal  Psych: Normal affect   Labs    High Sensitivity Troponin:   Recent Labs  Lab 08/24/19 0955 08/24/19 1153  TROPONINIHS 12 11      Chemistry Recent Labs  Lab 08/24/19 0955 08/25/19 0518 08/26/19 0437  NA 143  143 142 143  K 4.4  4.3 4.4 4.3  CL 105 101 95*  CO2 28 28 34*  GLUCOSE 136* 105* 117*  BUN 13 13 15   CREATININE 1.01* 1.02* 1.31*  CALCIUM 8.6* 8.3* 8.5*  PROT 6.5  --   --   ALBUMIN 3.3*  --   --   AST 20  --   --   ALT 16  --   --   ALKPHOS 78  --   --   BILITOT 0.6  --   --   GFRNONAA 48* 47* 35*  GFRAA 55* 55* 40*  ANIONGAP 10 13 14      Hematology Recent Labs  Lab 08/24/19 0955 08/25/19 0518  WBC 7.5 7.8  RBC 3.32* 3.21*  HGB 9.5*  11.9* 9.4*  HCT 33.8*  35.0* 33.0*  MCV 101.8* 102.8*  MCH 28.6 29.3  MCHC 28.1* 28.5*  RDW 16.5* 16.4*  PLT 172 144*    BNP Recent Labs  Lab 08/24/19 0955  BNP 713.6*     DDimer No results for input(s): DDIMER in the last 168 hours.   Radiology    DG Chest Portable 1 View  Result Date: 08/24/2019 CLINICAL DATA:  Shortness of breath. EXAM: PORTABLE CHEST 1 VIEW COMPARISON:  08/09/2019 FINDINGS: Right and left hemidiaphragm are obscured greater on the left than the right. Signs of median sternotomy and CABG with aortic valve replacement with stable cardiomediastinal contour enlargement. Linear areas of airspace disease as developed since the prior study in the right mid chest and in the left mid chest. Graded opacity at the right and left lung base. No significant skeletal findings aside from median sternotomy. IMPRESSION: Signs of bilateral effusions and basilar airspace disease with interval development of areas of atelectatic change in the mid chest and with worsening opacification in the lung bases particularly in the retrocardiac region. Electronically Signed   By: Zetta Bills M.D.   On: 08/24/2019 10:31   ECHOCARDIOGRAM COMPLETE  Result Date: 08/25/2019     ECHOCARDIOGRAM REPORT   Patient Name:   Rebecca Skinner Date of Exam: 08/25/2019 Medical Rec #:  [id number]   Height:       62.0 in Accession #:    [id number]  Weight:       182.3 lb Date of Birth:  05/28/1923    BSA:          1.838 m Patient Age:    86 years    BP:           155/63 mmHg Patient Gender: F           HR:           65 bpm. Exam Location:  Inpatient Procedure: 2D Echo and Intracardiac Opacification Agent Indications:    CHF-Acute Diastolic [id number] / [id number]  History:        Patient has prior history of Echocardiogram examinations, most                 recent 01/27/2019. CAD, Pacemaker and Prior CABG, TIA and Stroke,                 Arrythmias:Atrial Fibrillation and sick sinus syndrome; Risk                 Factors:Hypertension, Dyslipidemia and Diabetes. GERD. Dementia.                 Aortic Valve: unknown pericardial valve is present in the aortic                 position. Procedure Date: 1998.  Sonographer:    Darlina Sicilian RDCS Referring Phys: [id number] RONDELL A SMITH  Sonographer Comments: Definity attempted, nevery returned to the heart, nurse made aware. IMPRESSIONS  1. Left ventricular ejection fraction, by estimation, is 60 to 65%. The left ventricle has normal function. The left ventricle has no regional wall motion abnormalities. There is moderate left ventricular hypertrophy. Left ventricular diastolic parameters are indeterminate. There is the interventricular septum is flattened in systole and diastole, consistent with right ventricular pressure and volume overload.  2. Right ventricular systolic function is mildly reduced. The right ventricular size is severely enlarged. There is moderately elevated pulmonary artery systolic pressure. The estimated right ventricular systolic pressure is 123456 mmHg.  3. Left atrial size was mildly dilated.  4. Right atrial size was severely  dilated.  5. The mitral valve is normal in structure. Trivial mitral valve regurgitation.  6. Tricuspid valve regurgitation is  moderate to severe.  7. The aortic valve has been repaired/replaced. Aortic valve regurgitation is trivial. There is a bioprosthetic valve present in the aortic position. Normal bioprosthetic function. Mean gradient 76mmHg, EOA 2.1 cm^2, DI 0.62  8. Aortic dilatation noted. There is mild dilatation of the ascending aorta measuring 37 mm.  9. The inferior vena cava is dilated in size with <50% respiratory variability, suggesting right atrial pressure of 15 mmHg. FINDINGS  Left Ventricle: Left ventricular ejection fraction, by estimation, is 60 to 65%. The left ventricle has normal function. The left ventricle has no regional wall motion abnormalities. Definity contrast agent was given IV to delineate the left ventricular  endocardial borders. The left ventricular internal cavity size was small. There is moderate left ventricular hypertrophy. The interventricular septum is flattened in systole and diastole, consistent with right ventricular pressure and volume overload. Left ventricular diastolic parameters are indeterminate. Right Ventricle: The right ventricular size is severely enlarged. Right vetricular wall thickness was not assessed. Right ventricular systolic function is mildly reduced. There is moderately elevated pulmonary artery systolic pressure. The tricuspid regurgitant velocity is 3.67 m/s, and with an assumed right atrial pressure of 15 mmHg, the estimated right ventricular systolic pressure is 123456 mmHg. Left Atrium: Left atrial size was mildly dilated. Right Atrium: Right atrial size was severely dilated. Pericardium: Trivial pericardial effusion is present. Mitral Valve: The mitral valve is normal in structure. Trivial mitral valve regurgitation. MV peak gradient, 13.5 mmHg. The mean mitral valve gradient is 4.5 mmHg. Tricuspid Valve: The tricuspid valve is normal in structure. Tricuspid valve regurgitation is moderate to severe. Aortic Valve: The aortic valve has been repaired/replaced. Aortic valve  regurgitation is trivial. Aortic valve mean gradient measures 9.0 mmHg. Aortic valve peak gradient measures 18.9 mmHg. Aortic valve area, by VTI measures 1.92 cm. There is a unknown  pericardial valve present in the aortic position. Procedure Date: 1998. Pulmonic Valve: The pulmonic valve was grossly normal. Pulmonic valve regurgitation is mild. Aorta: Aortic dilatation noted. There is mild dilatation of the ascending aorta measuring 37 mm. Venous: The inferior vena cava is dilated in size with less than 50% respiratory variability, suggesting right atrial pressure of 15 mmHg. IAS/Shunts: The interatrial septum was not well visualized.  LEFT VENTRICLE PLAX 2D LVIDd:         3.30 cm LVIDs:         2.80 cm LV PW:         1.00 cm LV IVS:        1.20 cm LVOT diam:     1.97 cm LV SV:         77 LV SV Index:   42 LVOT Area:     3.04 cm  LEFT ATRIUM           Index       RIGHT ATRIUM           Index LA diam:      3.70 cm 2.01 cm/m  RA Area:     31.30 cm LA Vol (A4C): 72.4 ml 39.39 ml/m RA Volume:   128.00 ml 69.64 ml/m  AORTIC VALVE AV Area (Vmax):    1.82 cm AV Area (Vmean):   1.69 cm AV Area (VTI):     1.92 cm AV Vmax:           217.50 cm/s AV Vmean:  141.500 cm/s AV VTI:            0.403 m AV Peak Grad:      18.9 mmHg AV Mean Grad:      9.0 mmHg LVOT Vmax:         130.00 cm/s LVOT Vmean:        78.800 cm/s LVOT VTI:          0.255 m LVOT/AV VTI ratio: 0.63  AORTA Ao Root diam: 3.00 cm Ao Asc diam:  3.70 cm MITRAL VALVE                TRICUSPID VALVE MV Area (PHT): 2.69 cm     TR Peak grad:   53.9 mmHg MV Peak grad:  13.5 mmHg    TR Vmax:        367.00 cm/s MV Mean grad:  4.5 mmHg MV Vmax:       1.84 m/s     SHUNTS MV Vmean:      97.6 cm/s    Systemic VTI:  0.26 m MV Decel Time: 282 msec     Systemic Diam: 1.97 cm MV E velocity: 173.50 cm/s Oswaldo Milian MD Electronically signed by Oswaldo Milian MD Signature Date/Time: 08/25/2019/1:29:11 PM    Final     Cardiac Studies   Echo  08/25/19: 1. Left ventricular ejection fraction, by estimation, is 60 to 65%. The  left ventricle has normal function. The left ventricle has no regional  wall motion abnormalities. There is moderate left ventricular hypertrophy.  Left ventricular diastolic  parameters are indeterminate. There is the interventricular septum is  flattened in systole and diastole, consistent with right ventricular  pressure and volume overload.  2. Right ventricular systolic function is mildly reduced. The right  ventricular size is severely enlarged. There is moderately elevated  pulmonary artery systolic pressure. The estimated right ventricular  systolic pressure is 123456 mmHg.  3. Left atrial size was mildly dilated.  4. Right atrial size was severely dilated.  5. The mitral valve is normal in structure. Trivial mitral valve  regurgitation.  6. Tricuspid valve regurgitation is moderate to severe.  7. The aortic valve has been repaired/replaced. Aortic valve  regurgitation is trivial. There is a bioprosthetic valve present in the  aortic position. Normal bioprosthetic function. Mean gradient 21mmHg, EOA  2.1 cm^2, DI 0.62  8. Aortic dilatation noted. There is mild dilatation of the ascending  aorta measuring 37 mm.  9. The inferior vena cava is dilated in size with <50% respiratory  variability, suggesting right atrial pressure of 15 mmHg.   Patient Profile     86 y.o. female with a hx of paroxysmal atrial fibrillation on aspirin, hyperlipidemia, CAD s/p CABG X1 and bioprosthetic AVR 1998, multiple CVAs,  hypothyroidism, diabetes type 2, GI bleed, GERD and dementia who is being seen today for the evaluation of CHF   Assessment & Plan    1. Acute on chronic diastolic heart failure - echo yesterday with preserved EF, right ventricular dysfunction, elevated PA pressure - BNP elevated to 714 - diuresing on 40 mg IV lasix BID - weight is 184 lbs, from 181 lbs on admission - 1.1 L urine output  yesterday, overall net negative 1.6 L  - sCr 1.31 with BUN 15 - continue diuretic today   2. Anemia 3. GI bleed - 08/09/19 - Hb 9.4 today - pt was admitted with GI bleed on 08/09/19 and anticoagulation was held - continues to have dark stools with  positive FOBT    4. Hypertension - continue home amlodipine 10 mg, imdur 30 mg, lopressor 50 mg BID - BP labile - no medication changes   5. Persistent atrial fibrillation - AC on hold with GI bleed - continue ASA 81 mg       For questions or updates, please contact [place] Please consult [email or web address] for contact info under        Signed, Ledora Bottcher, PA  08/26/2019, 9:04 AM    I have seen and examined the patient along with Ledora Bottcher, PA.  I have reviewed the chart, notes and new data.  I agree with PA/NP's note.  Key new complaints: hard to get any review of systems, but appears less uncomfortable today. Twitches when you touch her chest or abdomen, but OK with deeper palpation when distracted. Key examination changes: appears reasonably comfortable lying almost fully flat. Key new findings / data: creatinine with slight increase.  PLAN: Judging by weight log, her "dry weight" is probably 165-175 lb. Needs at least another 10 lb weight loss by diuresis.  Sanda Klein, MD, [place] [phone] 08/26/2019, 11:20 AM

## 2019-08-26 NOTE — Progress Notes (Signed)
[facility]  PROGRESS NOTE  Rebecca Skinner [id number] DOB: 08-Nov-1922 DOA: 08/24/2019 PCP: Colon Branch, MD   Brief HPI:   86 year old female with a history of hypertension, hyperlipidemia, CAD, hypothyroidism, paroxysmal atrial fibrillation on aspirin, diabetes mellitus type 2, GI bleed, GERD, dementia who presented to ED with complaints of shortness of breath and chest pain.  Patient has dementia, history was obtained from patient's daughter.  Patient became delusional at home and was unable to recognize her family members which was unusual for her.  She was recently mated for symptomatic anemia on 08/09/2019 2 08/09/2020 at that time she was transfused 1 unit PRBC and was discharged home to follow-up with GI.  At that time Plavix was stopped.  Patient has been still having black stools.  She was supposed to follow-up with GI yesterday.  She has been on furosemide 20 mg twice a day.  In route to the ED she was found to be hypoxic with O2 sats down to 76% on room air and improved on 5 L of oxygen via nasal cannula to 96%. Upon admission the ED she was found to be requiring 2 L/min of oxygen via nasal cannula.  BNP 713.6.  Troponin 12.0.  Chest x-ray concerning for bilateral pleural effusion and basilar airspace disease.  Stool guaiac was positive.  She was given Lasix 40 mg IV in the ED.    Subjective   Patient seen and examined, continues to be lethargic.  Opens eyes to verbal stimuli.   Assessment/Plan:     1. Acute respiratory failure with hypoxia-secondary to diastolic congestive heart failure.  Patient presented with complaints of acute onset of shortness of breath with oxygen saturation noted to be as low as 76% on room air, it improved with oxygen via nasal cannula.  As per daughter Lasix dose was recently changed per cardiology.  BNP was elevated some on 3.6.  Chest x-ray showed bilateral pleural effusion bibasilar airspace disease.  Last EF was 60-65% in August 2020.  Patient has been  started on  Lasix 40 mg IV every 12 hours.  Echocardiogram shows preserved EF, right ventricle dysfunction, elevated PA pressure.  Appreciate cardiology recommendations.  2. GI bleed with melena-patient was supposed to follow-up with GI as outpatient but was admitted to the hospital.  Her previous hemoglobin on 08/16/2019 was 9 g/dL.  Today hemoglobin is stable at 9.3 g/dL.  We will keep a close watch and transfuse as needed for hemoglobin less than 7.  As long as hemoglobin remains stable, she can follow-up with GI as outpatient.  If she continues to drop hemoglobin precipitously, will obtain formal GI consultation while inpatient.  3. Hyperthyroidism-continue Tapazole 5 mg on Monday/Wednesday/Friday.  Last T4 was 0.75 on 12/14/2018.Free T4 obtained yesterday, 0.83.  4. Dementia with behavior disturbance-safety mittens, continue Seroquel at bedtime.  5. Paroxysmal atrial fibrillation-patient has been on Plavix and aspirin, but during last hospitalization Plavix was discontinued due to bleeding and she was recommended to continue with aspirin.  Continue with aspirin at this time.  6. CAD-s/p CABG, stable.  7. Hypertension-blood pressure stable, continue metoprolol, amlodipine, furosemide.  8. Diabetes mellitus type 2-diet controlled.  Last A1c was 5.9 on 08/10/2019.  9. CODE STATUS-patient is DNR, palliative care has been already involved and they are going to follow-up with patient and the family on 08/27/2019 as outpatient.     SpO2: 98 % O2 Flow Rate (L/min): 2 L/min   COVID-19 Labs  No results for input(s): DDIMER,  FERRITIN, LDH, CRP in the last 72 hours.  Lab Results  Component Value Date   SARSCOV2NAA NEGATIVE 08/24/2019   [place] NEGATIVE 08/09/2019   [place] NEGATIVE 05/22/2019     CBG: Recent Labs  Lab 08/24/19 0941 08/24/19 1007  GLUCAP 121* 117*    CBC: Recent Labs  Lab 08/24/19 0955 08/25/19 0518 08/26/19 0437  WBC 7.5 7.8 7.9  NEUTROABS 6.0  --   --    HGB 9.5*  11.9* 9.4* 9.3*  HCT 33.8*  35.0* 33.0* 31.1*  MCV 101.8* 102.8* 97.8  PLT 172 144* 135*    Basic Metabolic Panel: Recent Labs  Lab 08/24/19 0955 08/25/19 0518 08/26/19 0437  NA 143  143 142 143  K 4.4  4.3 4.4 4.3  CL 105 101 95*  CO2 28 28 34*  GLUCOSE 136* 105* 117*  BUN 13 13 15   CREATININE 1.01* 1.02* 1.31*  CALCIUM 8.6* 8.3* 8.5*  MG  --  1.8  --      Liver Function Tests: Recent Labs  Lab 08/24/19 0955  AST 20  ALT 16  ALKPHOS 78  BILITOT 0.6  PROT 6.5  ALBUMIN 3.3*        DVT prophylaxis: SCDs  Code Status: DNR  Family Communication: No family at bedside  Disposition Plan: Patient admitted with acute respiratory failure with hypoxia due to diastolic heart failure.  Started on IV diuretics.  Cardiology has been consulted.  Barrier to discharge- still requiring oxygen, hypoxic.  Continue to diurese as tolerated.         Scheduled medications:  . amLODipine  10 mg Oral Daily  . aspirin EC  81 mg Oral Daily  . atorvastatin  40 mg Oral q1800  . budesonide  0.5 mg Nebulization Daily  . furosemide  40 mg Intravenous BID  . isosorbide mononitrate  30 mg Oral Daily  . magnesium oxide  400 mg Oral Daily  . mouth rinse  15 mL Mouth Rinse BID  . methimazole  5 mg Oral Q M,W,F  . metoprolol tartrate  50 mg Oral BID  . pantoprazole  40 mg Oral Daily  . potassium chloride  10 mEq Oral Daily  . QUEtiapine  25 mg Oral QHS  . sodium chloride flush  3 mL Intravenous Q12H    Consultants:    Procedures:    Antibiotics:   Anti-infectives (From admission, onward)   None       Objective   Vitals:   08/26/19 0720 08/26/19 0841 08/26/19 0948 08/26/19 1152  BP: (!) 117/53  133/61 123/67  Pulse: 75 87 72 88  Resp: 18 18 20 20   Temp: 98.5 F (36.9 C)  98 F (36.7 C) 98.3 F (36.8 C)  TempSrc: Oral  Oral Oral  SpO2: 100% 98% 96% 98%  Weight:      Height:        Intake/Output Summary (Last 24 hours) at 08/26/2019  1403 Last data filed at 08/26/2019 1313 Gross per 24 hour  Intake 600 ml  Output 1450 ml  Net -850 ml    03/16 1901 - 03/18 0700 In: 483 [P.O.:480; I.V.:3] Out: 2100 [Urine:2100]  Filed Weights   08/24/19 0918 08/25/19 0529 08/26/19 0117  Weight: 82.3 kg 82.7 kg 83.5 kg    Physical Examination:   General-appears in no acute distress Heart-S1-S2, regular, no murmur auscultated Lungs-decreased breath sounds bilaterally Abdomen-soft, nontender, no organomegaly Extremities-no edema in the lower extremities Neuro-somnolent, but arousable   Data Reviewed:  Recent Results (from the past 240 hour(s))  SARS CORONAVIRUS 2 (TAT 6-24 HRS) Nasopharyngeal Nasopharyngeal Swab     Status: None   Collection Time: 08/24/19 12:55 PM   Specimen: Nasopharyngeal Swab  Result Value Ref Range Status   SARS Coronavirus 2 NEGATIVE NEGATIVE Final    Comment: (NOTE) SARS-CoV-2 target nucleic acids are NOT DETECTED. The SARS-CoV-2 RNA is generally detectable in upper and lower respiratory specimens during the acute phase of infection. Negative results do not preclude SARS-CoV-2 infection, do not rule out co-infections with other pathogens, and should not be used as the sole basis for treatment or other patient management decisions. Negative results must be combined with clinical observations, patient history, and epidemiological information. The expected result is Negative. Fact Sheet for Patients: SugarRoll.be Fact Sheet for Healthcare Providers: https://www.woods-mathews.com/ This test is not yet approved or cleared by the [place] FDA and  has been authorized for detection and/or diagnosis of SARS-CoV-2 by FDA under an Emergency Use Authorization (EUA). This EUA will remain  in effect (meaning this test can be used) for the duration of the COVID-19 declaration under Section 56 4(b)(1) of the Act, 21 U.S.C. section 360bbb-3(b)(1), unless the  authorization is terminated or revoked sooner. Performed at [facility], [place] [address] [place], [place] [address]     No results for input(s): LIPASE, AMYLASE in the last 168 hours. No results for input(s): AMMONIA in the last 168 hours.  Cardiac Enzymes: No results for input(s): CKTOTAL, CKMB, CKMBINDEX, TROPONINI in the last 168 hours. BNP (last 3 results) Recent Labs    05/22/19 2200 08/24/19 0955  BNP 698.3* 713.6*    ProBNP (last 3 results) No results for input(s): PROBNP in the last 8760 hours.  Studies:  ECHOCARDIOGRAM COMPLETE  Result Date: 08/25/2019    ECHOCARDIOGRAM REPORT   Patient Name:   BRENLYN BAXENDALE Date of Exam: 08/25/2019 Medical Rec #:  [id number]   Height:       62.0 in Accession #:    [id number]  Weight:       182.3 lb Date of Birth:  05/15/1923    BSA:          1.838 m Patient Age:    65 years    BP:           155/63 mmHg Patient Gender: F           HR:           65 bpm. Exam Location:  Inpatient Procedure: 2D Echo and Intracardiac Opacification Agent Indications:    CHF-Acute Diastolic [id number] / [id number]  History:        Patient has prior history of Echocardiogram examinations, most                 recent 01/27/2019. CAD, Pacemaker and Prior CABG, TIA and Stroke,                 Arrythmias:Atrial Fibrillation and sick sinus syndrome; Risk                 Factors:Hypertension, Dyslipidemia and Diabetes. GERD. Dementia.                 Aortic Valve: unknown pericardial valve is present in the aortic                 position. Procedure Date: 1998.  Sonographer:    Darlina Sicilian RDCS Referring Phys: [id number] RONDELL A SMITH  Sonographer Comments: Definity attempted,  nevery returned to the heart, nurse made aware. IMPRESSIONS  1. Left ventricular ejection fraction, by estimation, is 60 to 65%. The left ventricle has normal function. The left ventricle has no regional wall motion abnormalities. There is moderate left ventricular hypertrophy. Left ventricular diastolic  parameters are indeterminate. There is the interventricular septum is flattened in systole and diastole, consistent with right ventricular pressure and volume overload.  2. Right ventricular systolic function is mildly reduced. The right ventricular size is severely enlarged. There is moderately elevated pulmonary artery systolic pressure. The estimated right ventricular systolic pressure is 123456 mmHg.  3. Left atrial size was mildly dilated.  4. Right atrial size was severely dilated.  5. The mitral valve is normal in structure. Trivial mitral valve regurgitation.  6. Tricuspid valve regurgitation is moderate to severe.  7. The aortic valve has been repaired/replaced. Aortic valve regurgitation is trivial. There is a bioprosthetic valve present in the aortic position. Normal bioprosthetic function. Mean gradient 40mmHg, EOA 2.1 cm^2, DI 0.62  8. Aortic dilatation noted. There is mild dilatation of the ascending aorta measuring 37 mm.  9. The inferior vena cava is dilated in size with <50% respiratory variability, suggesting right atrial pressure of 15 mmHg. FINDINGS  Left Ventricle: Left ventricular ejection fraction, by estimation, is 60 to 65%. The left ventricle has normal function. The left ventricle has no regional wall motion abnormalities. Definity contrast agent was given IV to delineate the left ventricular  endocardial borders. The left ventricular internal cavity size was small. There is moderate left ventricular hypertrophy. The interventricular septum is flattened in systole and diastole, consistent with right ventricular pressure and volume overload. Left ventricular diastolic parameters are indeterminate. Right Ventricle: The right ventricular size is severely enlarged. Right vetricular wall thickness was not assessed. Right ventricular systolic function is mildly reduced. There is moderately elevated pulmonary artery systolic pressure. The tricuspid regurgitant velocity is 3.67 m/s, and with an  assumed right atrial pressure of 15 mmHg, the estimated right ventricular systolic pressure is 123456 mmHg. Left Atrium: Left atrial size was mildly dilated. Right Atrium: Right atrial size was severely dilated. Pericardium: Trivial pericardial effusion is present. Mitral Valve: The mitral valve is normal in structure. Trivial mitral valve regurgitation. MV peak gradient, 13.5 mmHg. The mean mitral valve gradient is 4.5 mmHg. Tricuspid Valve: The tricuspid valve is normal in structure. Tricuspid valve regurgitation is moderate to severe. Aortic Valve: The aortic valve has been repaired/replaced. Aortic valve regurgitation is trivial. Aortic valve mean gradient measures 9.0 mmHg. Aortic valve peak gradient measures 18.9 mmHg. Aortic valve area, by VTI measures 1.92 cm. There is a unknown  pericardial valve present in the aortic position. Procedure Date: 1998. Pulmonic Valve: The pulmonic valve was grossly normal. Pulmonic valve regurgitation is mild. Aorta: Aortic dilatation noted. There is mild dilatation of the ascending aorta measuring 37 mm. Venous: The inferior vena cava is dilated in size with less than 50% respiratory variability, suggesting right atrial pressure of 15 mmHg. IAS/Shunts: The interatrial septum was not well visualized.  LEFT VENTRICLE PLAX 2D LVIDd:         3.30 cm LVIDs:         2.80 cm LV PW:         1.00 cm LV IVS:        1.20 cm LVOT diam:     1.97 cm LV SV:         77 LV SV Index:   42 LVOT Area:  3.04 cm  LEFT ATRIUM           Index       RIGHT ATRIUM           Index LA diam:      3.70 cm 2.01 cm/m  RA Area:     31.30 cm LA Vol (A4C): 72.4 ml 39.39 ml/m RA Volume:   128.00 ml 69.64 ml/m  AORTIC VALVE AV Area (Vmax):    1.82 cm AV Area (Vmean):   1.69 cm AV Area (VTI):     1.92 cm AV Vmax:           217.50 cm/s AV Vmean:          141.500 cm/s AV VTI:            0.403 m AV Peak Grad:      18.9 mmHg AV Mean Grad:      9.0 mmHg LVOT Vmax:         130.00 cm/s LVOT Vmean:        78.800  cm/s LVOT VTI:          0.255 m LVOT/AV VTI ratio: 0.63  AORTA Ao Root diam: 3.00 cm Ao Asc diam:  3.70 cm MITRAL VALVE                TRICUSPID VALVE MV Area (PHT): 2.69 cm     TR Peak grad:   53.9 mmHg MV Peak grad:  13.5 mmHg    TR Vmax:        367.00 cm/s MV Mean grad:  4.5 mmHg MV Vmax:       1.84 m/s     SHUNTS MV Vmean:      97.6 cm/s    Systemic VTI:  0.26 m MV Decel Time: 282 msec     Systemic Diam: 1.97 cm MV E velocity: 173.50 cm/s Oswaldo Milian MD Electronically signed by Oswaldo Milian MD Signature Date/Time: 08/25/2019/1:29:11 PM    Final      Admission status: The appropriate admission status for this patient is INPATIENT. Inpatient status is judged to be reasonable and necessary in order to provide the required intensity of service to ensure the patient's safety. The patient's presenting symptoms, physical exam findings, and initial radiographic and laboratory data in the context of their chronic comorbidities is felt to place them at high risk for further clinical deterioration. Furthermore, it is not anticipated that the patient will be medically stable for discharge from the hospital within 2 midnights of admission. The following factors support the admission status of inpatient.     The patient's presenting symptoms include hypoxia, shortness of breath The worrisome physical exam findings include hypoxia, requiring 2 L/min of oxygen. The initial radiographic and laboratory data are worrisome because of bilateral pleural effusion, BNP elevated 716. The chronic co-morbidities include CHF, history of GI bleed, dementia, hypertension, CAD.       * I certify that at the point of admission it is my clinical judgment that the patient will require inpatient hospital care spanning beyond 2 midnights from the point of admission due to high intensity of service, high risk for further deterioration and high frequency of surveillance required.Oswald Hillock   [facility] If 7PM-7AM, please contact night-coverage at [email or web address], Office  [phone]   08/26/2019, 2:03 PM  LOS: 2 days

## 2019-08-26 NOTE — Evaluation (Signed)
Occupational Therapy Evaluation Patient Details Name: Rebecca Skinner MRN: [id number] DOB: 1923/02/16 Today's Date: 08/26/2019    History of Present Illness Pt is a 86 yo female presenting with SOB and chest pain. Upon arrival pt hypoxic on RA with O2 sats in 70s, dx with CHF exacerbation. The pt has been reccently admitted for GIB (3/1-3/2) and symptomatic anemia. PMH includes: HTN, HLD< CAD, hypothyroidism, afib on aspirin, DM II, GERD, an dementia.   Clinical Impression   Patient is a 86 year old female that lives with her daughter. All PLOF info obtained from PT eval (when DTR was present to provide info) as patient has significant dementia. Patient does response "hospital" when asked where she is, disoriented to all other questions. Patient with limited activity tolerance and task initiation to sit up at edge of bed requiring max A for supine to sit and mod A for sit to supine. Attempt to have patient sit up at edge of bed to participate in ADLs, however patient continuously laying herself back down onto her L elbow. Patient able to maintain static sitting long enough to blow her nose then laid back down. Will continue to follow to maximize patient mobility/functional transfers to minimize caregiver burden.   Currently, recommending no OT follow up as patient's daughter provides assist with all ADLs at baseline. If daughter feels patient's level of care has increased may recommend [facility] OT/HH aide for additional support.     Follow Up Recommendations  No OT follow up;Supervision/Assistance - 24 hour;Other (comment)(home health aid if pt's DTR feels need for additional A)    Equipment Recommendations  None recommended by OT;Other (comment)(has DME)       Precautions / Restrictions Precautions Precautions: Fall Precaution Comments: watch O2 Restrictions Weight Bearing Restrictions: No      Mobility Bed Mobility Overal bed mobility: Needs Assistance Bed Mobility: Supine to Sit;Sit to  Supine     Supine to sit: Max assist Sit to supine: Mod assist   General bed mobility comments: limited initiation despite max multimodal cues, max A to elevate trunk and mobilize LEs to edge of bed, mod A to lift LEs onto bed with patient laying herself back to bed  Transfers Overall transfer level: Needs assistance               General transfer comment: unable to assess due to limited activity tolerance, lethargy, cognition    Balance Overall balance assessment: Needs assistance Sitting-balance support: Bilateral upper extremity supported Sitting balance-Leahy Scale: Poor Sitting balance - Comments: frequent min to mod A provided to maintain sitting upright Postural control: Posterior lean                                 ADL either performed or assessed with clinical judgement   ADL Overall ADL's : At baseline;Needs assistance/impaired     Grooming: Moderate assistance   Upper Body Bathing: Maximal assistance   Lower Body Bathing: Maximal assistance   Upper Body Dressing : Maximal assistance   Lower Body Dressing: Maximal assistance               Functional mobility during ADLs: Moderate assistance General ADL Comments: per chart review patient requires assist with all ADLs, limited eval as patient kept laying herself back onto the bed despite max encouragement to sit upright.      Vision Baseline Vision/History: Wears glasses  Pertinent Vitals/Pain Pain Assessment: Faces Faces Pain Scale: No hurt     Hand Dominance Right   Extremity/Trunk Assessment Upper Extremity Assessment Upper Extremity Assessment: Generalized weakness   Lower Extremity Assessment Lower Extremity Assessment: Defer to PT evaluation   Cervical / Trunk Assessment Cervical / Trunk Assessment: Kyphotic   Communication Communication Communication: HOH   Cognition Arousal/Alertness: Lethargic Behavior During Therapy: Flat affect Overall  Cognitive Status: History of cognitive impairments - at baseline                                 General Comments: when asked who she lives with pt reports "daddy" multiple times, per chart and report from DTR with PT patient has significant dementia   General Comments  attempt to have patient sit up at edge of bed to participate in self care, patient only sit up long enough to blow her nose then repeatedly laying herself back onto the bed.            Home Living Family/patient expects to be discharged to:: Private residence Living Arrangements: Children Available Help at Discharge: Family;Available 24 hours/day Type of Home: House Home Access: Level entry     Home Layout: One level     Bathroom Shower/Tub: Teacher, early years/pre: Handicapped height Bathroom Accessibility: Yes How Accessible: Accessible via walker Home Equipment: Shower seat;Walker - 4 wheels;Grab bars - tub/shower;Cane - quad   Additional Comments: (info obtained from PT eval which daughter provided)      Prior Functioning/Environment Level of Independence: Needs assistance  Gait / Transfers Assistance Needed: uses rollator ADL's / Homemaking Assistance Needed: daughter does all ADLs, pt can dress herself but oftedn daughter helps to facilitate   Comments: pt has life alert system        OT Problem List: Decreased strength;Decreased activity tolerance;Impaired balance (sitting and/or standing);Decreased safety awareness      OT Treatment/Interventions: Self-care/ADL training;Therapeutic exercise;Therapeutic activities;Patient/family education;Balance training    OT Goals(Current goals can be found in the care plan section) Acute Rehab OT Goals Patient Stated Goal: to sleep OT Goal Formulation: With patient Time For Goal Achievement: 09/09/19 Potential to Achieve Goals: Fair  OT Frequency: Min 2X/week    AM-PAC OT "6 Clicks" Daily Activity     Outcome Measure Help from  another person eating meals?: A Little Help from another person taking care of personal grooming?: A Lot Help from another person toileting, which includes using toliet, bedpan, or urinal?: A Lot Help from another person bathing (including washing, rinsing, drying)?: A Lot Help from another person to put on and taking off regular upper body clothing?: A Lot Help from another person to put on and taking off regular lower body clothing?: A Lot 6 Click Score: 13   End of Session Equipment Utilized During Treatment: Oxygen Nurse Communication: Mobility status  Activity Tolerance: Patient limited by lethargy;Patient limited by fatigue Patient left: in bed;with call bell/phone within reach;with bed alarm set  OT Visit Diagnosis: Other abnormalities of gait and mobility (R26.89);Muscle weakness (generalized) (M62.81)                Time: 1000-1018 OT Time Calculation (min): 18 min Charges:  OT General Charges $OT Visit: 1 Visit OT Evaluation $OT Eval Low Complexity: [place] OT OT office: [place] 08/26/2019, 11:59 AM

## 2019-08-27 ENCOUNTER — Other Ambulatory Visit: Payer: Medicare Other | Admitting: Internal Medicine

## 2019-08-27 DIAGNOSIS — F0391 Unspecified dementia with behavioral disturbance: Secondary | ICD-10-CM

## 2019-08-27 LAB — BASIC METABOLIC PANEL
Anion gap: 12 (ref 5–15)
BUN: 15 mg/dL (ref 8–23)
CO2: 36 mmol/L — ABNORMAL HIGH (ref 22–32)
Calcium: 8.4 mg/dL — ABNORMAL LOW (ref 8.9–10.3)
Chloride: 95 mmol/L — ABNORMAL LOW (ref 98–111)
Creatinine, Ser: 1.31 mg/dL — ABNORMAL HIGH (ref 0.44–1.00)
GFR calc Af Amer: 40 mL/min — ABNORMAL LOW (ref >60)
GFR calc non Af Amer: 35 mL/min — ABNORMAL LOW (ref >60)
Glucose, Bld: 121 mg/dL — ABNORMAL HIGH (ref 70–99)
Potassium: 4 mmol/L (ref 3.5–5.1)
Sodium: 143 mmol/L (ref 135–145)

## 2019-08-27 MED ORDER — IPRATROPIUM-ALBUTEROL 0.5-2.5 (3) MG/3ML IN SOLN: 3.0000 mL | Freq: Four times a day (QID) | RESPIRATORY_TRACT | Status: DC

## 2019-08-27 MED ORDER — IPRATROPIUM-ALBUTEROL 0.5-2.5 (3) MG/3ML IN SOLN: 3.0000 mL | RESPIRATORY_TRACT | Status: DC | PRN

## 2019-08-27 NOTE — Progress Notes (Signed)
Physical Therapy Treatment Patient Details Name: Rebecca Skinner MRN: [id number] DOB: [birth year] Today's Date: 08/27/2019    History of Present Illness Pt is a 86 yo female presenting with SOB and chest pain. Upon arrival pt hypoxic on RA with O2 sats in 70s, dx with CHF exacerbation. The pt has been reccently admitted for GIB (3/1-3/2) and symptomatic anemia. PMH includes: HTN, HLD< CAD, hypothyroidism, afib on aspirin, DM II, GERD, an dementia.      PT Comments    Pt was limited today due to lethargy and decreased ability to participate/ follow commands.  Family was not present.  RN reports pt did receive Seroquel last night that could be contributing.  VSS during PT.  Required increased time, multimodal cues, initiation, and education for all transfers/mobility.  Transferred to chair with min A of 2 but unable to ambulate further.  Changed recommendation to SNF due to declined performance (also required max A yesterday with OT); however, family not present and pt may have been limited due to lethargy so left frequency 3 x week.    Follow Up Recommendations  SNF     Equipment Recommendations  None recommended by PT    Recommendations for Other Services       Precautions / Restrictions Precautions Precautions: Fall Precaution Comments: watch O2 Restrictions Weight Bearing Restrictions: No    Mobility  Bed Mobility Overal bed mobility: Needs Assistance Bed Mobility: Supine to Sit     Supine to sit: Mod assist     General bed mobility comments: Required increased time to arouse and encourage/convince to sit EOB.  Required assist to initiate leg movement and mod A for trunk  Transfers Overall transfer level: Needs assistance Equipment used: Rolling walker (2 wheeled) Transfers: Sit to/from [facility] Sit to Stand: +2 safety/equipment;Min assist Stand pivot transfers: Min assist;+2 safety/equipment       General transfer comment: Tried for 10-15 mins to get  pt to stand but not participating.  Performed EOB activity.  RN and tech into assist with getting pt back to bed and pt agreed to get to chair.  Min A of 2 for transfer to chair.  Ambulation/Gait Ambulation/Gait assistance: Min assist Gait Distance (Feet): 2 Feet Assistive device: 2 person hand held assist Gait Pattern/deviations: Step-to pattern;Decreased stride length;Shuffle;Trunk flexed Gait velocity: decreased   General Gait Details: steps to chair   Stairs             Wheelchair Mobility    Modified Rankin (Stroke Patients Only)       Balance Overall balance assessment: Needs assistance Sitting-balance support: Bilateral upper extremity supported Sitting balance-Leahy Scale: Fair Sitting balance - Comments: Sat EOB for 15 -20 mins with close SBA   Standing balance support: Bilateral upper extremity supported Standing balance-Leahy Scale: Poor                              Cognition Arousal/Alertness: Lethargic Behavior During Therapy: Flat affect Overall Cognitive Status: No family/caregiver present to determine baseline cognitive functioning                                 General Comments: Pt very lethargic required increased time for arousal.  At times just mumbled, but at others stated lived with dtr [name] and was at [facility].      Exercises General Exercises - Lower Extremity Ankle  Circles/Pumps: AAROM;Both;Seated;10 reps Long Arc Quad: AAROM;Both;10 reps;Seated    General Comments        Pertinent Vitals/Pain Pain Assessment: No/denies pain   HR 70's O2 sats upper 90's throughout treatment BP 143/62 in chair    Home Living                      Prior Function            PT Goals (current goals can now be found in the care plan section) Progress towards PT goals: Not progressing toward goals - comment(limited due to dementia, lethargy (got seroquel last night))    Frequency    Min 3X/week       PT Plan Discharge plan needs to be updated    Co-evaluation              AM-PAC PT "6 Clicks" Mobility   Outcome Measure  Help needed turning from your back to your side while in a flat bed without using bedrails?: A Lot Help needed moving from lying on your back to sitting on the side of a flat bed without using bedrails?: A Lot Help needed moving to and from a bed to a chair (including a wheelchair)?: A Lot Help needed standing up from a chair using your arms (e.g., wheelchair or bedside chair)?: A Lot Help needed to walk in hospital room?: A Lot Help needed climbing 3-5 steps with a railing? : A Lot 6 Click Score: 12    End of Session Equipment Utilized During Treatment: Gait belt;Oxygen Activity Tolerance: Other (comment)(limited by dementia/confusion) Patient left: in chair;with chair alarm set;with call bell/phone within reach(telesitter) Nurse Communication: Mobility status PT Visit Diagnosis: Unsteadiness on feet (R26.81);Difficulty in walking, not elsewhere classified (R26.2)     Time: 1220-1250 PT Time Calculation (min) (ACUTE ONLY): 30 min  Charges:  $Therapeutic Activity: 23-37 mins                     Maggie Font, PT Acute Rehab Services Pager [phone] [facility] [phone] [facility] [phone]    Karlton Lemon 08/27/2019, 1:02 PM

## 2019-08-27 NOTE — Progress Notes (Signed)
Progress Note  Patient Name: Rebecca Skinner Date of Encounter: 08/27/2019  Primary Cardiologist: Shelva Majestic, MD   Subjective   Continues to diurese well and appears comfortable lying flat.   Net diuresis 2.6 L, weight down 277 pounds, a net reduction of about 7 pounds from maximum weight on this admission. Discussed her recent progression with her daughter.  She had noted the patient's gradual increase in weight.  She was doing well when her weight was 165 pounds at home. Her diuretics have been stopped due to worsening kidney function. She reports that her mother has a gurgly sounding cough that happens all day long and has been going on for years.  Produces clear sputum.  Occasionally when the sputum increases and becomes dark-tinged she receives a prednisone dose with improvement.  Inpatient Medications    Scheduled Meds: . amLODipine  10 mg Oral Daily  . aspirin EC  81 mg Oral Daily  . atorvastatin  40 mg Oral q1800  . budesonide  0.5 mg Nebulization Daily  . furosemide  40 mg Intravenous BID  . ipratropium-albuterol  3 mL Nebulization QID  . isosorbide mononitrate  30 mg Oral Daily  . magnesium oxide  400 mg Oral Daily  . mouth rinse  15 mL Mouth Rinse BID  . methimazole  5 mg Oral Q M,W,F  . metoprolol tartrate  50 mg Oral BID  . pantoprazole  40 mg Oral Daily  . potassium chloride  10 mEq Oral Daily  . QUEtiapine  25 mg Oral QHS  . sodium chloride flush  3 mL Intravenous Q12H   Continuous Infusions: . sodium chloride     PRN Meds: sodium chloride, acetaminophen, cloNIDine, nitroGLYCERIN, ondansetron (ZOFRAN) IV, sodium chloride flush   Vital Signs    Vitals:   08/26/19 1840 08/26/19 1919 08/27/19 0308 08/27/19 0810  BP: (!) 143/52 125/68 133/62   Pulse: 83 85 69 91  Resp: 20 20 18 18   Temp: 98.9 F (37.2 C) 99.3 F (37.4 C) 98.5 F (36.9 C)   TempSrc: Oral Oral Oral   SpO2: 100% 99% 100% 98%  Weight:   80.3 kg   Height:        Intake/Output Summary  (Last 24 hours) at 08/27/2019 0922 Last data filed at 08/27/2019 0915 Gross per 24 hour  Intake 360 ml  Output 1351 ml  Net -991 ml   Last 3 Weights 08/27/2019 08/26/2019 08/25/2019  Weight (lbs) 177 lb 184 lb 182 lb 5.1 oz  Weight (kg) 80.287 kg 83.462 kg 82.7 kg      Telemetry    Sinus rhythm- Personally Reviewed  ECG    No new tracing- Personally Reviewed  Physical Exam  Looks comfortable lying almost flat. GEN: No acute distress.   Neck:  5 cm JVD Cardiac:  Irregular, early peaking aortic ejection murmur no diastolic murmurs, rubs, or gallops.  Respiratory: Clear to auscultation bilaterally. GI: Soft, nontender, non-distended  MS: No edema; No deformity. Neuro:  Nonfocal  Psych: Normal affect   Labs    High Sensitivity Troponin:   Recent Labs  Lab 08/24/19 0955 08/24/19 1153  TROPONINIHS 12 11      Chemistry Recent Labs  Lab 08/24/19 0955 08/24/19 0955 08/25/19 0518 08/26/19 0437 08/27/19 0347  NA 143  143   < > 142 143 143  K 4.4  4.3   < > 4.4 4.3 4.0  CL 105   < > 101 95* 95*  CO2 28   < >  28 34* 36*  GLUCOSE 136*   < > 105* 117* 121*  BUN 13   < > 13 15 15   CREATININE 1.01*   < > 1.02* 1.31* 1.31*  CALCIUM 8.6*   < > 8.3* 8.5* 8.4*  PROT 6.5  --   --   --   --   ALBUMIN 3.3*  --   --   --   --   AST 20  --   --   --   --   ALT 16  --   --   --   --   ALKPHOS 78  --   --   --   --   BILITOT 0.6  --   --   --   --   GFRNONAA 48*   < > 47* 35* 35*  GFRAA 55*   < > 55* 40* 40*  ANIONGAP 10   < > 13 14 12    < > = values in this interval not displayed.     Hematology Recent Labs  Lab 08/24/19 0955 08/25/19 0518 08/26/19 0437  WBC 7.5 7.8 7.9  RBC 3.32* 3.21* 3.18*  HGB 9.5*  11.9* 9.4* 9.3*  HCT 33.8*  35.0* 33.0* 31.1*  MCV 101.8* 102.8* 97.8  MCH 28.6 29.3 29.2  MCHC 28.1* 28.5* 29.9*  RDW 16.5* 16.4* 16.3*  PLT 172 144* 135*    BNP Recent Labs  Lab 08/24/19 0955  BNP 713.6*     DDimer No results for input(s): DDIMER in  the last 168 hours.   Radiology    ECHOCARDIOGRAM COMPLETE  Result Date: 08/25/2019    ECHOCARDIOGRAM REPORT   Patient Name:   Rebecca Skinner Date of Exam: 08/25/2019 Medical Rec #:  [id number]   Height:       62.0 in Accession #:    [id number]  Weight:       182.3 lb Date of Birth:  January 02, 1923    BSA:          1.838 m Patient Age:    86 years    BP:           155/63 mmHg Patient Gender: F           HR:           65 bpm. Exam Location:  Inpatient Procedure: 2D Echo and Intracardiac Opacification Agent Indications:    CHF-Acute Diastolic [id number] / [id number]  History:        Patient has prior history of Echocardiogram examinations, most                 recent 01/27/2019. CAD, Pacemaker and Prior CABG, TIA and Stroke,                 Arrythmias:Atrial Fibrillation and sick sinus syndrome; Risk                 Factors:Hypertension, Dyslipidemia and Diabetes. GERD. Dementia.                 Aortic Valve: unknown pericardial valve is present in the aortic                 position. Procedure Date: 1998.  Sonographer:    Darlina Sicilian RDCS Referring Phys: [id number] RONDELL A SMITH  Sonographer Comments: Definity attempted, nevery returned to the heart, nurse made aware. IMPRESSIONS  1. Left ventricular ejection fraction, by estimation, is 60 to 65%. The left ventricle has normal function. The left  ventricle has no regional wall motion abnormalities. There is moderate left ventricular hypertrophy. Left ventricular diastolic parameters are indeterminate. There is the interventricular septum is flattened in systole and diastole, consistent with right ventricular pressure and volume overload.  2. Right ventricular systolic function is mildly reduced. The right ventricular size is severely enlarged. There is moderately elevated pulmonary artery systolic pressure. The estimated right ventricular systolic pressure is 123456 mmHg.  3. Left atrial size was mildly dilated.  4. Right atrial size was severely dilated.  5. The mitral valve is  normal in structure. Trivial mitral valve regurgitation.  6. Tricuspid valve regurgitation is moderate to severe.  7. The aortic valve has been repaired/replaced. Aortic valve regurgitation is trivial. There is a bioprosthetic valve present in the aortic position. Normal bioprosthetic function. Mean gradient 39mmHg, EOA 2.1 cm^2, DI 0.62  8. Aortic dilatation noted. There is mild dilatation of the ascending aorta measuring 37 mm.  9. The inferior vena cava is dilated in size with <50% respiratory variability, suggesting right atrial pressure of 15 mmHg. FINDINGS  Left Ventricle: Left ventricular ejection fraction, by estimation, is 60 to 65%. The left ventricle has normal function. The left ventricle has no regional wall motion abnormalities. Definity contrast agent was given IV to delineate the left ventricular  endocardial borders. The left ventricular internal cavity size was small. There is moderate left ventricular hypertrophy. The interventricular septum is flattened in systole and diastole, consistent with right ventricular pressure and volume overload. Left ventricular diastolic parameters are indeterminate. Right Ventricle: The right ventricular size is severely enlarged. Right vetricular wall thickness was not assessed. Right ventricular systolic function is mildly reduced. There is moderately elevated pulmonary artery systolic pressure. The tricuspid regurgitant velocity is 3.67 m/s, and with an assumed right atrial pressure of 15 mmHg, the estimated right ventricular systolic pressure is 123456 mmHg. Left Atrium: Left atrial size was mildly dilated. Right Atrium: Right atrial size was severely dilated. Pericardium: Trivial pericardial effusion is present. Mitral Valve: The mitral valve is normal in structure. Trivial mitral valve regurgitation. MV peak gradient, 13.5 mmHg. The mean mitral valve gradient is 4.5 mmHg. Tricuspid Valve: The tricuspid valve is normal in structure. Tricuspid valve regurgitation  is moderate to severe. Aortic Valve: The aortic valve has been repaired/replaced. Aortic valve regurgitation is trivial. Aortic valve mean gradient measures 9.0 mmHg. Aortic valve peak gradient measures 18.9 mmHg. Aortic valve area, by VTI measures 1.92 cm. There is a unknown  pericardial valve present in the aortic position. Procedure Date: 1998. Pulmonic Valve: The pulmonic valve was grossly normal. Pulmonic valve regurgitation is mild. Aorta: Aortic dilatation noted. There is mild dilatation of the ascending aorta measuring 37 mm. Venous: The inferior vena cava is dilated in size with less than 50% respiratory variability, suggesting right atrial pressure of 15 mmHg. IAS/Shunts: The interatrial septum was not well visualized.  LEFT VENTRICLE PLAX 2D LVIDd:         3.30 cm LVIDs:         2.80 cm LV PW:         1.00 cm LV IVS:        1.20 cm LVOT diam:     1.97 cm LV SV:         77 LV SV Index:   42 LVOT Area:     3.04 cm  LEFT ATRIUM           Index       RIGHT ATRIUM  Index LA diam:      3.70 cm 2.01 cm/m  RA Area:     31.30 cm LA Vol (A4C): 72.4 ml 39.39 ml/m RA Volume:   128.00 ml 69.64 ml/m  AORTIC VALVE AV Area (Vmax):    1.82 cm AV Area (Vmean):   1.69 cm AV Area (VTI):     1.92 cm AV Vmax:           217.50 cm/s AV Vmean:          141.500 cm/s AV VTI:            0.403 m AV Peak Grad:      18.9 mmHg AV Mean Grad:      9.0 mmHg LVOT Vmax:         130.00 cm/s LVOT Vmean:        78.800 cm/s LVOT VTI:          0.255 m LVOT/AV VTI ratio: 0.63  AORTA Ao Root diam: 3.00 cm Ao Asc diam:  3.70 cm MITRAL VALVE                TRICUSPID VALVE MV Area (PHT): 2.69 cm     TR Peak grad:   53.9 mmHg MV Peak grad:  13.5 mmHg    TR Vmax:        367.00 cm/s MV Mean grad:  4.5 mmHg MV Vmax:       1.84 m/s     SHUNTS MV Vmean:      97.6 cm/s    Systemic VTI:  0.26 m MV Decel Time: 282 msec     Systemic Diam: 1.97 cm MV E velocity: 173.50 cm/s Oswaldo Milian MD Electronically signed by Oswaldo Milian  MD Signature Date/Time: 08/25/2019/1:29:11 PM    Final     Cardiac Studies    Echo 08/25/19: 1. Left ventricular ejection fraction, by estimation, is 60 to 65%. The  left ventricle has normal function. The left ventricle has no regional  wall motion abnormalities. There is moderate left ventricular hypertrophy.  Left ventricular diastolic  parameters are indeterminate. There is the interventricular septum is  flattened in systole and diastole, consistent with right ventricular  pressure and volume overload.  2. Right ventricular systolic function is mildly reduced. The right  ventricular size is severely enlarged. There is moderately elevated  pulmonary artery systolic pressure. The estimated right ventricular  systolic pressure is 123456 mmHg.  3. Left atrial size was mildly dilated.  4. Right atrial size was severely dilated.  5. The mitral valve is normal in structure. Trivial mitral valve  regurgitation.  6. Tricuspid valve regurgitation is moderate to severe.  7. The aortic valve has been repaired/replaced. Aortic valve  regurgitation is trivial. There is a bioprosthetic valve present in the  aortic position. Normal bioprosthetic function. Mean gradient 66mmHg, EOA  2.1 cm^2, DI 0.62  8. Aortic dilatation noted. There is mild dilatation of the ascending  aorta measuring 37 mm.  9. The inferior vena cava is dilated in size with <50% respiratory  variability, suggesting right atrial pressure of 15 mmHg.    Patient Profile     86 y.o. female with a hx of paroxysmal atrial fibrillation on aspirin, hyperlipidemia, CADs/p CABG X1 andbioprostheticAVR 1998, multiple CVAs,hypothyroidism, diabetes type 2, GI bleed, GERD and dementiawho is being seen today for the evaluation of CHF  Assessment & Plan    1. CHF: Recommend continuing intravenous diuretics until her weight is under 170 pounds.  Then  starting furosemide 40 mg daily, closely watching her weight on a daily  basis.  Increase diuretic dose when weight exceeds 170 pounds or if she ever gains quickly (3 pounds / 24 hours, 5 pounds/week).  Her daughter carefully provides a sodium restricted diet.  Edema does not appear to provide a reliable sign of hypervolemia/acute left heart failure. Amyloidosis is considered as a possible etiology for her diastolic heart failure, but in view of her age and advanced cognitive issues aggressive treatment with disease modifying drug such as tafamidis does not appear indicated. 2. HTN: Adequate control. 3. Anemia: Stable during this admission, no evidence of active bleeding.  Note macrocytosis suggesting myelodysplasia (recently with normal B12 and folate acid levels).  She is not on anticoagulation due to history of recent GI bleeding (August 09, 2019, continued Hemoccult positive stools).  Continue aspirin 81 mg daily. 4.  Longstanding persistent atrial fibrillation: well rate controlled.     For questions or updates, please contact [place] Please consult [email or web address] for contact info under        Signed, Sanda Klein, MD  08/27/2019, 9:22 AM

## 2019-08-27 NOTE — Progress Notes (Signed)
[facility]  PROGRESS NOTE  Rebecca Skinner [id number] DOB: 04-25-1923 DOA: 08/24/2019 PCP: Colon Branch, MD   Brief HPI:   86 year old female with a history of hypertension, hyperlipidemia, CAD, hypothyroidism, paroxysmal atrial fibrillation on aspirin, diabetes mellitus type 2, GI bleed, GERD, dementia who presented to ED with complaints of shortness of breath and chest pain.  Patient has dementia, history was obtained from patient's daughter.  Patient became delusional at home and was unable to recognize her family members which was unusual for her.  She was recently mated for symptomatic anemia on 08/09/2019 2 08/09/2020 at that time she was transfused 1 unit PRBC and was discharged home to follow-up with GI.  At that time Plavix was stopped.  Patient has been still having black stools.  She was supposed to follow-up with GI yesterday.  She has been on furosemide 20 mg twice a day.  In route to the ED she was found to be hypoxic with O2 sats down to 76% on room air and improved on 5 L of oxygen via nasal cannula to 96%. Upon admission the ED she was found to be requiring 2 L/min of oxygen via nasal cannula.  BNP 713.6.  Troponin 12.0.  Chest x-ray concerning for bilateral pleural effusion and basilar airspace disease.  Stool guaiac was positive.  She was given Lasix 40 mg IV in the ED.    Subjective   Patient seen and examined, diuresing well with IV Lasix.  Breathing is improved.   Assessment/Plan:     1. Acute respiratory failure with hypoxia-secondary to diastolic congestive heart failure.  Patient presented with complaints of acute onset of shortness of breath with oxygen saturation noted to be as low as 76% on room air, it improved with oxygen via nasal cannula.  As per daughter Lasix dose was recently changed per cardiology.  BNP was elevated some on 3.6.  Chest x-ray showed bilateral pleural effusion bibasilar airspace disease.  Last EF was 60-65% in August 2020.  Patient has been  started on  Lasix 40 mg IV every 12 hours.  Echocardiogram shows preserved EF, right ventricle dysfunction, elevated PA pressure.  Appreciate cardiology recommendations.  Cardiology recommends to continue with IV Lasix 40 mg every 12 hours till her weight is less than 170 pounds.  And then switch to p.o. Lasix 40 mg daily.  2. GI bleed with melena-patient was supposed to follow-up with GI as outpatient but was admitted to the hospital.  Her previous hemoglobin on 08/16/2019 was 9 g/dL.  Today hemoglobin is stable at 9.3 g/dL.  We will keep a close watch and transfuse as needed for hemoglobin less than 7.  As long as hemoglobin remains stable, she can follow-up with GI as outpatient.  If she continues to drop hemoglobin precipitously, will obtain formal GI consultation while inpatient.  Follow CBC in a.m.  3. Hyperthyroidism-continue Tapazole 5 mg on Monday/Wednesday/Friday.  Last T4 was 0.75 on 12/14/2018.Free T4 obtained yesterday, 0.83.  4. Acute kidney injury on CKD stage IIIa-patient creatinine is worse after starting diuresis.  Today creatinine is 1.31.  Follow BMP in a.m.  5. Dementia with behavior disturbance-safety mittens, continue Seroquel at bedtime.  6. Paroxysmal atrial fibrillation-patient has been on Plavix and aspirin, but during last hospitalization Plavix was discontinued due to bleeding and she was recommended to continue with aspirin.  Continue with aspirin at this time.  7. COPD-patient has underlying chronic respiratory failure from COPD.  Has mild wheezing, decreased breath sounds.  Start DuoNeb nebulizers every 6 hours.  8. CAD-s/p CABG, stable.  9. Hypertension-blood pressure stable, continue metoprolol, amlodipine, furosemide.  10. Diabetes mellitus type 2-diet controlled.  Last A1c was 5.9 on 08/10/2019.  67. CODE STATUS-patient is DNR, palliative care has been already involved and they are going to follow-up with patient and the family on 08/27/2019 as outpatient.      SpO2: 100 % O2 Flow Rate (L/min): 2 L/min   COVID-19 Labs  No results for input(s): DDIMER, FERRITIN, LDH, CRP in the last 72 hours.  Lab Results  Component Value Date   [place] NEGATIVE 08/24/2019   [place] NEGATIVE 08/09/2019   [place] NEGATIVE 05/22/2019     CBG: Recent Labs  Lab 08/24/19 0941 08/24/19 1007  GLUCAP 121* 117*    CBC: Recent Labs  Lab 08/24/19 0955 08/25/19 0518 08/26/19 0437  WBC 7.5 7.8 7.9  NEUTROABS 6.0  --   --   HGB 9.5*  11.9* 9.4* 9.3*  HCT 33.8*  35.0* 33.0* 31.1*  MCV 101.8* 102.8* 97.8  PLT 172 144* 135*    Basic Metabolic Panel: Recent Labs  Lab 08/24/19 0955 08/25/19 0518 08/26/19 0437 08/27/19 0347  NA 143  143 142 143 143  K 4.4  4.3 4.4 4.3 4.0  CL 105 101 95* 95*  CO2 28 28 34* 36*  GLUCOSE 136* 105* 117* 121*  BUN 13 13 15 15   CREATININE 1.01* 1.02* 1.31* 1.31*  CALCIUM 8.6* 8.3* 8.5* 8.4*  MG  --  1.8  --   --      Liver Function Tests: Recent Labs  Lab 08/24/19 0955  AST 20  ALT 16  ALKPHOS 78  BILITOT 0.6  PROT 6.5  ALBUMIN 3.3*        DVT prophylaxis: SCDs  Code Status: DNR  Family Communication: Discussed with daughter at bedside  Disposition Plan: Patient admitted with acute respiratory failure with hypoxia due to diastolic heart failure.  Started on IV diuretics.  Cardiology has been consulted.  Barrier to discharge- still requiring oxygen, hypoxic.  Continue to diurese as tolerated.         Scheduled medications:  . amLODipine  10 mg Oral Daily  . aspirin EC  81 mg Oral Daily  . atorvastatin  40 mg Oral q1800  . budesonide  0.5 mg Nebulization Daily  . furosemide  40 mg Intravenous BID  . isosorbide mononitrate  30 mg Oral Daily  . magnesium oxide  400 mg Oral Daily  . mouth rinse  15 mL Mouth Rinse BID  . methimazole  5 mg Oral Q M,W,F  . metoprolol tartrate  50 mg Oral BID  . pantoprazole  40 mg Oral Daily  . potassium chloride  10 mEq Oral Daily  .  QUEtiapine  25 mg Oral QHS  . sodium chloride flush  3 mL Intravenous Q12H    Consultants:    Procedures:    Antibiotics:   Anti-infectives (From admission, onward)   None       Objective   Vitals:   08/27/19 0308 08/27/19 0810 08/27/19 0955 08/27/19 1108  BP: 133/62  (!) 144/57 (!) 125/55  Pulse: 69 91 78 83  Resp: 18 18 18  (!) 21  Temp: 98.5 F (36.9 C)  98.4 F (36.9 C) 99 F (37.2 C)  TempSrc: Oral  Oral Oral  SpO2: 100% 98% 96% 100%  Weight: 80.3 kg     Height:        Intake/Output Summary (Last  24 hours) at 08/27/2019 1255 Last data filed at 08/27/2019 1011 Gross per 24 hour  Intake 503 ml  Output 851 ml  Net -348 ml    03/17 1901 - 03/19 0700 In: 360 [P.O.:360] Out: 2301 [Urine:2300]  Filed Weights   08/25/19 0529 08/26/19 0117 08/27/19 0308  Weight: 82.7 kg 83.5 kg 80.3 kg    Physical Examination:   General-appears in no acute distress Heart-S1-S2, regular, no murmur auscultated Lungs-decreased breath sounds bilaterally Abdomen-soft, nontender, no organomegaly Extremities-no edema in the lower extremities Neuro-somnolent but arousable   Data Reviewed:   Recent Results (from the past 240 hour(s))  SARS CORONAVIRUS 2 (TAT 6-24 HRS) Nasopharyngeal Nasopharyngeal Swab     Status: None   Collection Time: 08/24/19 12:55 PM   Specimen: Nasopharyngeal Swab  Result Value Ref Range Status   SARS Coronavirus 2 NEGATIVE NEGATIVE Final    Comment: (NOTE) SARS-CoV-2 target nucleic acids are NOT DETECTED. The SARS-CoV-2 RNA is generally detectable in upper and lower respiratory specimens during the acute phase of infection. Negative results do not preclude SARS-CoV-2 infection, do not rule out co-infections with other pathogens, and should not be used as the sole basis for treatment or other patient management decisions. Negative results must be combined with clinical observations, patient history, and epidemiological information. The  expected result is Negative. Fact Sheet for Patients: SugarRoll.be Fact Sheet for Healthcare Providers: https://www.woods-mathews.com/ This test is not yet approved or cleared by the [place] FDA and  has been authorized for detection and/or diagnosis of SARS-CoV-2 by FDA under an Emergency Use Authorization (EUA). This EUA will remain  in effect (meaning this test can be used) for the duration of the COVID-19 declaration under Section 56 4(b)(1) of the Act, 21 U.S.C. section 360bbb-3(b)(1), unless the authorization is terminated or revoked sooner. Performed at [facility], [place] [address], [place], [place] [address]     No results for input(s): LIPASE, AMYLASE in the last 168 hours. No results for input(s): AMMONIA in the last 168 hours.  Cardiac Enzymes: No results for input(s): CKTOTAL, CKMB, CKMBINDEX, TROPONINI in the last 168 hours. BNP (last 3 results) Recent Labs    05/22/19 2200 08/24/19 0955  BNP 698.3* 713.6*    ProBNP (last 3 results) No results for input(s): PROBNP in the last 8760 hours.  Studies:  No results found.   Admission status: The appropriate admission status for this patient is INPATIENT. Inpatient status is judged to be reasonable and necessary in order to provide the required intensity of service to ensure the patient's safety. The patient's presenting symptoms, physical exam findings, and initial radiographic and laboratory data in the context of their chronic comorbidities is felt to place them at high risk for further clinical deterioration. Furthermore, it is not anticipated that the patient will be medically stable for discharge from the hospital within 2 midnights of admission. The following factors support the admission status of inpatient.     The patient's presenting symptoms include hypoxia, shortness of breath The worrisome physical exam findings include hypoxia, requiring 2 L/min of  oxygen. The initial radiographic and laboratory data are worrisome because of bilateral pleural effusion, BNP elevated 716. The chronic co-morbidities include CHF, history of GI bleed, dementia, hypertension, CAD.       * I certify that at the point of admission it is my clinical judgment that the patient will require inpatient hospital care spanning beyond 2 midnights from the point of admission due to high intensity of  service, high risk for further deterioration and high frequency of surveillance required.Oswald Hillock   [facility] If 7PM-7AM, please contact night-coverage at [email or web address], Office  [phone]   08/27/2019, 12:55 PM  LOS: 3 days

## 2019-08-27 NOTE — Progress Notes (Signed)
[place] and NT assisted PT with transferring patient to recliner. Patient able to stand assisted with encouragement from staff. Patient able to take oral meds with encouragement.   Paged [place] at [phone]. Did not receive MagOx and Protonix; tolerated all other oral meds ok.

## 2019-08-28 DIAGNOSIS — I5023 Acute on chronic systolic (congestive) heart failure: Secondary | ICD-10-CM

## 2019-08-28 DIAGNOSIS — I509 Heart failure, unspecified: Secondary | ICD-10-CM

## 2019-08-28 LAB — CBC
HCT: 31.5 % — ABNORMAL LOW (ref 36.0–46.0)
Hemoglobin: 9.2 g/dL — ABNORMAL LOW (ref 12.0–15.0)
MCH: 28.5 pg (ref 26.0–34.0)
MCHC: 29.2 g/dL — ABNORMAL LOW (ref 30.0–36.0)
MCV: 97.5 fL (ref 80.0–100.0)
Platelets: 170 10*3/uL (ref 150–400)
RBC: 3.23 MIL/uL — ABNORMAL LOW (ref 3.87–5.11)
RDW: 15.5 % (ref 11.5–15.5)
WBC: 7.3 10*3/uL (ref 4.0–10.5)
nRBC: 0 % (ref 0.0–0.2)

## 2019-08-28 LAB — BASIC METABOLIC PANEL
Anion gap: 14 (ref 5–15)
BUN: 15 mg/dL (ref 8–23)
CO2: 33 mmol/L — ABNORMAL HIGH (ref 22–32)
Calcium: 8.4 mg/dL — ABNORMAL LOW (ref 8.9–10.3)
Chloride: 94 mmol/L — ABNORMAL LOW (ref 98–111)
Creatinine, Ser: 1.1 mg/dL — ABNORMAL HIGH (ref 0.44–1.00)
GFR calc Af Amer: 50 mL/min — ABNORMAL LOW (ref >60)
GFR calc non Af Amer: 43 mL/min — ABNORMAL LOW (ref >60)
Glucose, Bld: 130 mg/dL — ABNORMAL HIGH (ref 70–99)
Potassium: 3.4 mmol/L — ABNORMAL LOW (ref 3.5–5.1)
Sodium: 141 mmol/L (ref 135–145)

## 2019-08-28 MED ORDER — POTASSIUM CHLORIDE CRYS ER 20 MEQ PO TBCR
20.0000 meq | EXTENDED_RELEASE_TABLET | Freq: Two times a day (BID) | ORAL | Status: DC
  Administered 2019-08-28: 20 meq via ORAL
  Filled 2019-08-28: qty 1

## 2019-08-28 MED ORDER — FUROSEMIDE 10 MG/ML IJ SOLN
60.0000 mg | Freq: Two times a day (BID) | INTRAMUSCULAR | Status: DC
  Administered 2019-08-28: 60 mg via INTRAVENOUS
  Filled 2019-08-28: qty 6

## 2019-08-28 MED ORDER — ASPIRIN EC 325 MG PO TBEC
325.0000 mg | DELAYED_RELEASE_TABLET | Freq: Every day | ORAL | Status: DC
  Administered 2019-08-28 – 2019-08-30 (×3): 325 mg via ORAL
  Filled 2019-08-28 (×3): qty 1

## 2019-08-28 MED ORDER — POTASSIUM CHLORIDE CRYS ER 10 MEQ PO TBCR
10.0000 meq | EXTENDED_RELEASE_TABLET | Freq: Two times a day (BID) | ORAL | Status: DC
  Administered 2019-08-28: 10 meq via ORAL

## 2019-08-28 NOTE — Progress Notes (Addendum)
[facility]  PROGRESS NOTE  Rebecca Skinner [id number] DOB: [birth year] DOA: 08/24/2019 PCP: Colon Branch, MD   Brief HPI:   86 year old female with a history of hypertension, hyperlipidemia, CAD, hypothyroidism, paroxysmal atrial fibrillation on aspirin, diabetes mellitus type 2, GI bleed, GERD, dementia who presented to ED with complaints of shortness of breath and chest pain.  Patient has dementia, history was obtained from patient's daughter.  Patient became delusional at home and was unable to recognize her family members which was unusual for her.  She was recently mated for symptomatic anemia on 08/09/2019 2 08/09/2020 at that time she was transfused 1 unit PRBC and was discharged home to follow-up with GI.  At that time Plavix was stopped.  Patient has been still having black stools.  She was supposed to follow-up with GI yesterday.  She has been on furosemide 20 mg twice a day.  In route to the ED she was found to be hypoxic with O2 sats down to 76% on room air and improved on 5 L of oxygen via nasal cannula to 96%. Upon admission the ED she was found to be requiring 2 L/min of oxygen via nasal cannula.  BNP 713.6.  Troponin 12.0.  Chest x-ray concerning for bilateral pleural effusion and basilar airspace disease.  Stool guaiac was positive.  She was given Lasix 40 mg IV in the ED.    Subjective   Patient seen and examined, breathing is improved.  Diuresing well with IV Lasix.   Assessment/Plan:     1. Acute respiratory failure with hypoxia-secondary to diastolic congestive heart failure.  Patient presented with complaints of acute onset of shortness of breath with oxygen saturation noted to be as low as 76% on room air, it improved with oxygen via nasal cannula.  As per daughter Lasix dose was recently changed per cardiology.  BNP was elevated some on 3.6.  Chest x-ray showed bilateral pleural effusion bibasilar airspace disease.  Last EF was 60-65% in August 2020.  Patient has been  started on  Lasix 40 mg IV every 12 hours.  Echocardiogram shows preserved EF, right ventricle dysfunction, elevated PA pressure.  Appreciate cardiology recommendations.  Dose of Lasix changed from 40 mg IV every 12 hours to 60 mg IV every 12 hours.  When her weight is less than 170 pounds, will switch to p.o. Lasix 40 mg daily.  2. GI bleed with melena-patient was supposed to follow-up with GI as outpatient but was admitted to the hospital.  Her previous hemoglobin on 08/16/2019 was 9 g/dL.  Today hemoglobin is stable at 9.2 g/dL.  We will keep a close watch and transfuse as needed for hemoglobin less than 7.  As long as hemoglobin remains stable, she can follow-up with GI as outpatient.  If she continues to drop hemoglobin precipitously, will obtain formal GI consultation while inpatient.  Follow CBC in a.m.  3. Hyperthyroidism-continue Tapazole 5 mg on Monday/Wednesday/Friday.  Last T4 was 0.75 on 12/14/2018.Free T4 obtained yesterday, 0.83.  4. Acute kidney injury on CKD stage IIIa-patient creatinine is worse after starting diuresis.  Today creatinine is 1.10.  Follow BMP in a.m.  5. Dementia with behavior disturbance-safety mittens, continue Seroquel at bedtime.  6. Paroxysmal atrial fibrillation-patient has been on Plavix and aspirin, but during last hospitalization Plavix was discontinued due to bleeding and she was recommended to continue with aspirin.  Continue with aspirin at this time.  7. COPD-patient has underlying chronic respiratory failure from COPD.  Has mild  wheezing, decreased breath sounds.  Start DuoNeb nebulizers every 6 hours.  8. CAD-s/p CABG, stable.  9. Hypertension-blood pressure stable, continue metoprolol, amlodipine, furosemide.  10. Diabetes mellitus type 2-diet controlled.  Last A1c was 5.9 on 08/10/2019.  73. CODE STATUS-patient is DNR, palliative care has been already involved and they were going to follow-up with patient and the family on 08/27/2019 as outpatient.   Will need to reschedule her appointment.     SpO2: 97 % O2 Flow Rate (L/min): 2 L/min   COVID-19 Labs  No results for input(s): DDIMER, FERRITIN, LDH, CRP in the last 72 hours.  Lab Results  Component Value Date   [place] NEGATIVE 08/24/2019   [place] NEGATIVE 08/09/2019   [place] NEGATIVE 05/22/2019     CBG: Recent Labs  Lab 08/24/19 0941 08/24/19 1007  GLUCAP 121* 117*    CBC: Recent Labs  Lab 08/24/19 0955 08/25/19 0518 08/26/19 0437 08/28/19 0352  WBC 7.5 7.8 7.9 7.3  NEUTROABS 6.0  --   --   --   HGB 9.5*  11.9* 9.4* 9.3* 9.2*  HCT 33.8*  35.0* 33.0* 31.1* 31.5*  MCV 101.8* 102.8* 97.8 97.5  PLT 172 144* 135* [id number]    Basic Metabolic Panel: Recent Labs  Lab 08/24/19 0955 08/25/19 0518 08/26/19 0437 08/27/19 0347 08/28/19 0352  NA 143  143 142 143 143 141  K 4.4  4.3 4.4 4.3 4.0 3.4*  CL 105 101 95* 95* 94*  CO2 28 28 34* 36* 33*  GLUCOSE 136* 105* 117* 121* 130*  BUN 13 13 15 15 15   CREATININE 1.01* 1.02* 1.31* 1.31* 1.10*  CALCIUM 8.6* 8.3* 8.5* 8.4* 8.4*  MG  --  1.8  --   --   --      Liver Function Tests: Recent Labs  Lab 08/24/19 0955  AST 20  ALT 16  ALKPHOS 78  BILITOT 0.6  PROT 6.5  ALBUMIN 3.3*        DVT prophylaxis: SCDs  Code Status: DNR  Family Communication: Discussed with daughter at bedside  Disposition Plan: Patient admitted with acute respiratory failure with hypoxia due to diastolic heart failure.  Started on IV diuretics.  Cardiology has been consulted.  Barrier to discharge- still requiring oxygen, hypoxic.  Continue to diurese as tolerated.         Scheduled medications:  . amLODipine  10 mg Oral Daily  . aspirin EC  325 mg Oral Daily  . atorvastatin  40 mg Oral q1800  . budesonide  0.5 mg Nebulization Daily  . furosemide  60 mg Intravenous BID  . isosorbide mononitrate  30 mg Oral Daily  . magnesium oxide  400 mg Oral Daily  . mouth rinse  15 mL Mouth Rinse BID  . methimazole   5 mg Oral Q M,W,F  . metoprolol tartrate  50 mg Oral BID  . pantoprazole  40 mg Oral Daily  . potassium chloride  10 mEq Oral BID  . QUEtiapine  25 mg Oral QHS  . sodium chloride flush  3 mL Intravenous Q12H    Consultants:    Procedures:    Antibiotics:   Anti-infectives (From admission, onward)   None       Objective   Vitals:   08/28/19 0400 08/28/19 0436 08/28/19 0848 08/28/19 0912  BP:  (!) 107/38  (!) 115/50  Pulse:  72  78  Resp:  18    Temp:  99.2 F (37.3 C)    TempSrc:  Oral    SpO2:  99% 97%   Weight: 85.7 kg     Height:        Intake/Output Summary (Last 24 hours) at 08/28/2019 1043 Last data filed at 08/28/2019 1000 Gross per 24 hour  Intake 480 ml  Output 400 ml  Net 80 ml    03/18 1901 - 03/20 0700 In: 563 [P.O.:560; I.V.:3] Out: 1250 [Urine:1250]  Filed Weights   08/26/19 0117 08/27/19 0308 08/28/19 0400  Weight: 83.5 kg 80.3 kg 85.7 kg    Physical Examination:   General-appears in no acute distress Heart-S1-S2, regular, no murmur auscultated Lungs-clear to auscultation bilaterally, no wheezing or crackles auscultated Abdomen-soft, nontender, no organomegaly Extremities-no edema in the lower extremities Neuro-somnolent but arousable   Data Reviewed:   Recent Results (from the past 240 hour(s))  SARS CORONAVIRUS 2 (TAT 6-24 HRS) Nasopharyngeal Nasopharyngeal Swab     Status: None   Collection Time: 08/24/19 12:55 PM   Specimen: Nasopharyngeal Swab  Result Value Ref Range Status   SARS Coronavirus 2 NEGATIVE NEGATIVE Final    Comment: (NOTE) SARS-CoV-2 target nucleic acids are NOT DETECTED. The SARS-CoV-2 RNA is generally detectable in upper and lower respiratory specimens during the acute phase of infection. Negative results do not preclude SARS-CoV-2 infection, do not rule out co-infections with other pathogens, and should not be used as the sole basis for treatment or other patient management decisions. Negative results  must be combined with clinical observations, patient history, and epidemiological information. The expected result is Negative. Fact Sheet for Patients: SugarRoll.be Fact Sheet for Healthcare Providers: https://www.woods-mathews.com/ This test is not yet approved or cleared by the [place] FDA and  has been authorized for detection and/or diagnosis of SARS-CoV-2 by FDA under an Emergency Use Authorization (EUA). This EUA will remain  in effect (meaning this test can be used) for the duration of the COVID-19 declaration under Section 56 4(b)(1) of the Act, 21 U.S.C. section 360bbb-3(b)(1), unless the authorization is terminated or revoked sooner. Performed at [facility], [place] [address] [place], [place] [address]     No results for input(s): LIPASE, AMYLASE in the last 168 hours. No results for input(s): AMMONIA in the last 168 hours.  Cardiac Enzymes: No results for input(s): CKTOTAL, CKMB, CKMBINDEX, TROPONINI in the last 168 hours. BNP (last 3 results) Recent Labs    05/22/19 2200 08/24/19 0955  BNP 698.3* 713.6*    ProBNP (last 3 results) No results for input(s): PROBNP in the last 8760 hours.  Studies:  No results found.   Admission status: The appropriate admission status for this patient is INPATIENT. Inpatient status is judged to be reasonable and necessary in order to provide the required intensity of service to ensure the patient's safety. The patient's presenting symptoms, physical exam findings, and initial radiographic and laboratory data in the context of their chronic comorbidities is felt to place them at high risk for further clinical deterioration. Furthermore, it is not anticipated that the patient will be medically stable for discharge from the hospital within 2 midnights of admission. The following factors support the admission status of inpatient.     The patient's presenting symptoms include hypoxia,  shortness of breath The worrisome physical exam findings include hypoxia, requiring 2 L/min of oxygen. The initial radiographic and laboratory data are worrisome because of bilateral pleural effusion, BNP elevated 716. The chronic co-morbidities include CHF, history of GI bleed, dementia, hypertension, CAD.       * I  certify that at the point of admission it is my clinical judgment that the patient will require inpatient hospital care spanning beyond 2 midnights from the point of admission due to high intensity of service, high risk for further deterioration and high frequency of surveillance required.Oswald Hillock   [facility] If 7PM-7AM, please contact night-coverage at [email or web address], Office  [phone]   08/28/2019, 10:43 AM  LOS: 4 days

## 2019-08-28 NOTE — Progress Notes (Signed)
Progress Note  Patient Name: Rebecca Skinner Date of Encounter: 08/28/2019  Primary Cardiologist: Shelva Majestic, MD   Subjective   Pt mumbles some   Comfortable laying flat  Says breathing better  No CP   Inpatient Medications    Scheduled Meds: . amLODipine  10 mg Oral Daily  . aspirin EC  81 mg Oral Daily  . atorvastatin  40 mg Oral q1800  . budesonide  0.5 mg Nebulization Daily  . furosemide  40 mg Intravenous BID  . isosorbide mononitrate  30 mg Oral Daily  . magnesium oxide  400 mg Oral Daily  . mouth rinse  15 mL Mouth Rinse BID  . methimazole  5 mg Oral Q M,W,F  . metoprolol tartrate  50 mg Oral BID  . pantoprazole  40 mg Oral Daily  . potassium chloride  10 mEq Oral Daily  . QUEtiapine  25 mg Oral QHS  . sodium chloride flush  3 mL Intravenous Q12H   Continuous Infusions: . sodium chloride     PRN Meds: sodium chloride, acetaminophen, cloNIDine, ipratropium-albuterol, nitroGLYCERIN, ondansetron (ZOFRAN) IV, sodium chloride flush   Vital Signs    Vitals:   08/27/19 1929 08/27/19 2300 08/28/19 0400 08/28/19 0436  BP: (!) 148/64   (!) 107/38  Pulse: 81   72  Resp: 20   18  Temp: 98.6 F (37 C)   99.2 F (37.3 C)  TempSrc: Oral   Oral  SpO2: 98% 100%  99%  Weight:   85.7 kg   Height:        Intake/Output Summary (Last 24 hours) at 08/28/2019 0645 Last data filed at 08/27/2019 2110 Gross per 24 hour  Intake 563 ml  Output 400 ml  Net 163 ml   NEt NEG  2.6 L  Last 3 Weights 08/28/2019 08/27/2019 08/26/2019  Weight (lbs) 189 lb 177 lb 184 lb  Weight (kg) 85.73 kg 80.287 kg 83.462 kg      Telemetry    Atrial fib 70s  - Personally Reviewed  ECG    No new tracing- Personally Reviewed  Physical Exam  Looks comfortable lying almost flat. GEN: No acute distress.   Neck:  Difficult with positon  Does not appear elevated   Cardiac:  Irregular, early peaking aortic ejection murmur  Respiratory: Upper airway wheez  Bases clear   GI: Soft, nontender,  non-distended  MS: No edema; No deformity. Neuro:  Nonfocal  Psych: Normal affect   Labs    High Sensitivity Troponin:   Recent Labs  Lab 08/24/19 0955 08/24/19 1153  TROPONINIHS 12 11      Chemistry Recent Labs  Lab 08/24/19 0955 08/25/19 0518 08/26/19 0437 08/27/19 0347 08/28/19 0352  NA 143  143   < > 143 143 141  K 4.4  4.3   < > 4.3 4.0 3.4*  CL 105   < > 95* 95* 94*  CO2 28   < > 34* 36* 33*  GLUCOSE 136*   < > 117* 121* 130*  BUN 13   < > 15 15 15   CREATININE 1.01*   < > 1.31* 1.31* 1.10*  CALCIUM 8.6*   < > 8.5* 8.4* 8.4*  PROT 6.5  --   --   --   --   ALBUMIN 3.3*  --   --   --   --   AST 20  --   --   --   --   ALT 16  --   --   --   --  ALKPHOS 78  --   --   --   --   BILITOT 0.6  --   --   --   --   GFRNONAA 48*   < > 35* 35* 43*  GFRAA 55*   < > 40* 40* 50*  ANIONGAP 10   < > 14 12 14    < > = values in this interval not displayed.     Hematology Recent Labs  Lab 08/25/19 0518 08/26/19 0437 08/28/19 0352  WBC 7.8 7.9 7.3  RBC 3.21* 3.18* 3.23*  HGB 9.4* 9.3* 9.2*  HCT 33.0* 31.1* 31.5*  MCV 102.8* 97.8 97.5  MCH 29.3 29.2 28.5  MCHC 28.5* 29.9* 29.2*  RDW 16.4* 16.3* 15.5  PLT 144* 135* 170    BNP Recent Labs  Lab 08/24/19 0955  BNP 713.6*     DDimer No results for input(s): DDIMER in the last 168 hours.   Radiology    No results found.  Cardiac Studies    Echo 08/25/19: 1. Left ventricular ejection fraction, by estimation, is 60 to 65%. The  left ventricle has normal function. The left ventricle has no regional  wall motion abnormalities. There is moderate left ventricular hypertrophy.  Left ventricular diastolic  parameters are indeterminate. There is the interventricular septum is  flattened in systole and diastole, consistent with right ventricular  pressure and volume overload.  2. Right ventricular systolic function is mildly reduced. The right  ventricular size is severely enlarged. There is moderately  elevated  pulmonary artery systolic pressure. The estimated right ventricular  systolic pressure is 123456 mmHg.  3. Left atrial size was mildly dilated.  4. Right atrial size was severely dilated.  5. The mitral valve is normal in structure. Trivial mitral valve  regurgitation.  6. Tricuspid valve regurgitation is moderate to severe.  7. The aortic valve has been repaired/replaced. Aortic valve  regurgitation is trivial. There is a bioprosthetic valve present in the  aortic position. Normal bioprosthetic function. Mean gradient 27mmHg, EOA  2.1 cm^2, DI 0.62  8. Aortic dilatation noted. There is mild dilatation of the ascending  aorta measuring 37 mm.  9. The inferior vena cava is dilated in size with <50% respiratory  variability, suggesting right atrial pressure of 15 mmHg.    Patient Profile     86 y.o. female with a hx of paroxysmal atrial fibrillation on aspirin, hyperlipidemia, CADs/p CABG X1 andbioprostheticAVR 1998, multiple CVAs,hypothyroidism, diabetes type 2, GI bleed, GERD and dementiawho is being seen today for the evaluation of CHF  Assessment & Plan    1. CHF: Patient diuresing   Question accuratcy of wts  Up for 177 to 189 She is rel comfortable flat    WIll give IV lasix today Increase to 60 bid  Replete K    Watch labs    When goes home will need po lasix daily with close f/u of wts  2. HTN: Adequate control. 3. Anemia:  Hgb is stable     4.  Longstanding persistent atrial fibrillation: well rate controlled. Not on anticoag due to bleeding  In past   Continue 325 ASA      For questions or updates, please contact [place] Please consult [email or web address] for contact info under        Signed, Dorris Carnes, MD  08/28/2019, 6:45 AM

## 2019-08-29 LAB — BASIC METABOLIC PANEL
Anion gap: 8 (ref 5–15)
BUN: 14 mg/dL (ref 8–23)
CO2: 40 mmol/L — ABNORMAL HIGH (ref 22–32)
Calcium: 8.3 mg/dL — ABNORMAL LOW (ref 8.9–10.3)
Chloride: 93 mmol/L — ABNORMAL LOW (ref 98–111)
Creatinine, Ser: 1.09 mg/dL — ABNORMAL HIGH (ref 0.44–1.00)
GFR calc Af Amer: 50 mL/min — ABNORMAL LOW (ref >60)
GFR calc non Af Amer: 43 mL/min — ABNORMAL LOW (ref >60)
Glucose, Bld: 116 mg/dL — ABNORMAL HIGH (ref 70–99)
Potassium: 3.7 mmol/L (ref 3.5–5.1)
Sodium: 141 mmol/L (ref 135–145)

## 2019-08-29 LAB — CBC
HCT: 31.5 % — ABNORMAL LOW (ref 36.0–46.0)
Hemoglobin: 9.3 g/dL — ABNORMAL LOW (ref 12.0–15.0)
MCH: 28.7 pg (ref 26.0–34.0)
MCHC: 29.5 g/dL — ABNORMAL LOW (ref 30.0–36.0)
MCV: 97.2 fL (ref 80.0–100.0)
Platelets: 145 10*3/uL — ABNORMAL LOW (ref 150–400)
RBC: 3.24 MIL/uL — ABNORMAL LOW (ref 3.87–5.11)
RDW: 15.4 % (ref 11.5–15.5)
WBC: 5.1 10*3/uL (ref 4.0–10.5)
nRBC: 0 % (ref 0.0–0.2)

## 2019-08-29 MED ORDER — AMLODIPINE BESYLATE 5 MG PO TABS
5.0000 mg | ORAL_TABLET | Freq: Every day | ORAL | Status: DC
  Administered 2019-08-29 – 2019-08-30 (×2): 5 mg via ORAL
  Filled 2019-08-29 (×2): qty 1

## 2019-08-29 MED ORDER — FUROSEMIDE 40 MG PO TABS
40.0000 mg | ORAL_TABLET | Freq: Every day | ORAL | Status: DC
  Administered 2019-08-29 – 2019-08-30 (×2): 40 mg via ORAL
  Filled 2019-08-29 (×2): qty 1

## 2019-08-29 MED ORDER — POTASSIUM CHLORIDE CRYS ER 10 MEQ PO TBCR
10.0000 meq | EXTENDED_RELEASE_TABLET | Freq: Every day | ORAL | Status: DC
  Administered 2019-08-29 – 2019-08-30 (×2): 10 meq via ORAL
  Filled 2019-08-29 (×2): qty 1

## 2019-08-29 NOTE — Progress Notes (Signed)
Paged [place] at [phone]. O2 86-89% on 2L @ 1030.Increased O2 to 3L, now 95-99%

## 2019-08-29 NOTE — Progress Notes (Signed)
Error

## 2019-08-29 NOTE — Progress Notes (Signed)
[facility]  PROGRESS NOTE  Rebecca Skinner [id number] DOB: 1923/04/03 DOA: 08/24/2019 PCP: Colon Branch, MD   Brief HPI:   86 year old female with a history of hypertension, hyperlipidemia, CAD, hypothyroidism, paroxysmal atrial fibrillation on aspirin, diabetes mellitus type 2, GI bleed, GERD, dementia who presented to ED with complaints of shortness of breath and chest pain.  Patient has dementia, history was obtained from patient's daughter.  Patient became delusional at home and was unable to recognize her family members which was unusual for her.  She was recently mated for symptomatic anemia on 08/09/2019 2 08/09/2020 at that time she was transfused 1 unit PRBC and was discharged home to follow-up with GI.  At that time Plavix was stopped.  Patient has been still having black stools.  She was supposed to follow-up with GI yesterday.  She has been on furosemide 20 mg twice a day.  In route to the ED she was found to be hypoxic with O2 sats down to 76% on room air and improved on 5 L of oxygen via nasal cannula to 96%. Upon admission the ED she was found to be requiring 2 L/min of oxygen via nasal cannula.  BNP 713.6.  Troponin 12.0.  Chest x-ray concerning for bilateral pleural effusion and basilar airspace disease.  Stool guaiac was positive.  She was given Lasix 40 mg IV in the ED.    Subjective   Patient seen and examined, denies shortness of breath or chest pain.   Assessment/Plan:    1. Acute respiratory failure with hypoxia-secondary to diastolic congestive heart failure.  Patient presented with complaints of acute onset of shortness of breath with oxygen saturation noted to be as low as 76% on room air, it improved with oxygen via nasal cannula.  As per daughter Lasix dose was recently changed per cardiology.  BNP was elevated some on 3.6.  Chest x-ray showed bilateral pleural effusion bibasilar airspace disease.  Last EF was 60-65% in August 2020.  Patient was started on Lasix 60 mg  IV every 12 hours.   Echocardiogram shows preserved EF, right ventricle dysfunction, elevated PA pressure.  Appreciate cardiology recommendations.  Dose of Lasix has been changed to 40 mg p.o. daily.  Cardiology following.  2. GI bleed with melena-patient was supposed to follow-up with GI as outpatient but was admitted to the hospital.  Her previous hemoglobin on 08/16/2019 was 9 g/dL.  Today hemoglobin is stable at 9.2 g/dL.  We will keep a close watch and transfuse as needed for hemoglobin less than 7.  As long as hemoglobin remains stable, she can follow-up with GI as outpatient.  If she continues to drop hemoglobin precipitously, will obtain formal GI consultation while inpatient.  Follow CBC in a.m.  3. Hyperthyroidism-continue Tapazole 5 mg on Monday/Wednesday/Friday.  Last T4 was 0.75 on 12/14/2018.Free T4 obtained yesterday, 0.83.  4. Acute kidney injury on CKD stage IIIa-patient creatinine is worse after starting diuresis.  Today creatinine is 1.09.  Follow BMP in a.m.  5. Dementia with behavior disturbance-safety mittens, continue Seroquel at bedtime.  6. Paroxysmal atrial fibrillation-patient has been on Plavix and aspirin, but during last hospitalization Plavix was discontinued due to bleeding and she was recommended to continue with aspirin.  Continue with aspirin at this time.  7. COPD-patient has underlying chronic respiratory failure from COPD.  Has mild wheezing, decreased breath sounds.  Started on  DuoNeb nebulizers every 6 hours.  8. CAD-s/p CABG, stable.  9. Hypertension-blood pressure stable, continue metoprolol,  amlodipine, furosemide.  10. Diabetes mellitus type 2-diet controlled.  Last A1c was 5.9 on 08/10/2019.  41. CODE STATUS-patient is DNR, palliative care has been already involved and they were going to follow-up with patient and the family on 08/27/2019 as outpatient.  Will need to reschedule her appointment.     SpO2: 93 % O2 Flow Rate (L/min): 3 L/min     Lab  Results  Component Value Date   SARSCOV2NAA NEGATIVE 08/24/2019   [place] NEGATIVE 08/09/2019   [place] NEGATIVE 05/22/2019     CBG: Recent Labs  Lab 08/24/19 0941 08/24/19 1007  GLUCAP 121* 117*    CBC: Recent Labs  Lab 08/24/19 0955 08/25/19 0518 08/26/19 0437 08/28/19 0352 08/29/19 0354  WBC 7.5 7.8 7.9 7.3 5.1  NEUTROABS 6.0  --   --   --   --   HGB 9.5*  11.9* 9.4* 9.3* 9.2* 9.3*  HCT 33.8*  35.0* 33.0* 31.1* 31.5* 31.5*  MCV 101.8* 102.8* 97.8 97.5 97.2  PLT 172 144* 135* 170 145*    Basic Metabolic Panel: Recent Labs  Lab 08/25/19 0518 08/26/19 0437 08/27/19 0347 08/28/19 0352 08/29/19 0354  NA 142 143 143 141 141  K 4.4 4.3 4.0 3.4* 3.7  CL 101 95* 95* 94* 93*  CO2 28 34* 36* 33* 40*  GLUCOSE 105* 117* 121* 130* 116*  BUN 13 15 15 15 14   CREATININE 1.02* 1.31* 1.31* 1.10* 1.09*  CALCIUM 8.3* 8.5* 8.4* 8.4* 8.3*  MG 1.8  --   --   --   --      Liver Function Tests: Recent Labs  Lab 08/24/19 0955  AST 20  ALT 16  ALKPHOS 78  BILITOT 0.6  PROT 6.5  ALBUMIN 3.3*        DVT prophylaxis: SCDs  Code Status: DNR  Family Communication: Discussed with daughter at bedside  Disposition Plan: Patient admitted with acute respiratory failure with hypoxia due to diastolic heart failure.  Started on IV diuretics.  Cardiology has been consulted.  Barrier to discharge- still requiring oxygen, hypoxic.  Continue to diurese as tolerated.         Scheduled medications:  . amLODipine  5 mg Oral Daily  . aspirin EC  325 mg Oral Daily  . atorvastatin  40 mg Oral q1800  . budesonide  0.5 mg Nebulization Daily  . furosemide  40 mg Oral Daily  . isosorbide mononitrate  30 mg Oral Daily  . magnesium oxide  400 mg Oral Daily  . mouth rinse  15 mL Mouth Rinse BID  . methimazole  5 mg Oral Q M,W,F  . metoprolol tartrate  50 mg Oral BID  . pantoprazole  40 mg Oral Daily  . potassium chloride  10 mEq Oral Daily  . QUEtiapine  25 mg Oral  QHS  . sodium chloride flush  3 mL Intravenous Q12H    Consultants:    Procedures:    Antibiotics:   Anti-infectives (From admission, onward)   None       Objective   Vitals:   08/29/19 0501 08/29/19 0722 08/29/19 1028 08/29/19 1034  BP: (!) 148/61   (!) 142/63  Pulse: 65   74  Resp: 16     Temp: (!) 97.5 F (36.4 C)     TempSrc: Oral     SpO2: 100% 97% (!) 89% 93%  Weight:      Height:        Intake/Output Summary (Last 24 hours) at  08/29/2019 1040 Last data filed at 08/29/2019 0105 Gross per 24 hour  Intake 460 ml  Output 1050 ml  Net -590 ml    03/19 1901 - 03/21 0700 In: 940 [P.O.:940] Out: 1050 [Urine:1050]  Filed Weights   08/27/19 0308 08/28/19 0400 08/29/19 0100  Weight: 80.3 kg 85.7 kg 82.6 kg    Physical Examination:   General-appears in no acute distress Heart-S1-S2, regular, no murmur auscultated Lungs-clear to auscultation bilaterally, no wheezing or crackles auscultated Abdomen-soft, nontender, no organomegaly Extremities-no edema in the lower extremities Neuro-alert, oriented x3, no focal deficit noted   Data Reviewed:   Recent Results (from the past 240 hour(s))  SARS CORONAVIRUS 2 (TAT 6-24 HRS) Nasopharyngeal Nasopharyngeal Swab     Status: None   Collection Time: 08/24/19 12:55 PM   Specimen: Nasopharyngeal Swab  Result Value Ref Range Status   SARS Coronavirus 2 NEGATIVE NEGATIVE Final    Comment: (NOTE) SARS-CoV-2 target nucleic acids are NOT DETECTED. The SARS-CoV-2 RNA is generally detectable in upper and lower respiratory specimens during the acute phase of infection. Negative results do not preclude SARS-CoV-2 infection, do not rule out co-infections with other pathogens, and should not be used as the sole basis for treatment or other patient management decisions. Negative results must be combined with clinical observations, patient history, and epidemiological information. The expected result is Negative. Fact  Sheet for Patients: SugarRoll.be Fact Sheet for Healthcare Providers: https://www.woods-mathews.com/ This test is not yet approved or cleared by the [place] FDA and  has been authorized for detection and/or diagnosis of SARS-CoV-2 by FDA under an Emergency Use Authorization (EUA). This EUA will remain  in effect (meaning this test can be used) for the duration of the COVID-19 declaration under Section 56 4(b)(1) of the Act, 21 U.S.C. section 360bbb-3(b)(1), unless the authorization is terminated or revoked sooner. Performed at [facility], [address], [place], [place] [address]     No results for input(s): LIPASE, AMYLASE in the last 168 hours. No results for input(s): AMMONIA in the last 168 hours.  Cardiac Enzymes: No results for input(s): CKTOTAL, CKMB, CKMBINDEX, TROPONINI in the last 168 hours. BNP (last 3 results) Recent Labs    05/22/19 2200 08/24/19 0955  BNP 698.3* 713.6*    ProBNP (last 3 results) No results for input(s): PROBNP in the last 8760 hours.  Studies:  No results found.   Admission status: The appropriate admission status for this patient is INPATIENT. Inpatient status is judged to be reasonable and necessary in order to provide the required intensity of service to ensure the patient's safety. The patient's presenting symptoms, physical exam findings, and initial radiographic and laboratory data in the context of their chronic comorbidities is felt to place them at high risk for further clinical deterioration. Furthermore, it is not anticipated that the patient will be medically stable for discharge from the hospital within 2 midnights of admission. The following factors support the admission status of inpatient.     The patient's presenting symptoms include hypoxia, shortness of breath The worrisome physical exam findings include hypoxia, requiring 2 L/min of oxygen. The initial radiographic and  laboratory data are worrisome because of bilateral pleural effusion, BNP elevated 716. The chronic co-morbidities include CHF, history of GI bleed, dementia, hypertension, CAD.       * I certify that at the point of admission it is my clinical judgment that the patient will require inpatient hospital care spanning beyond 2 midnights from the point of  admission due to high intensity of service, high risk for further deterioration and high frequency of surveillance required.Oswald Hillock   [facility] If 7PM-7AM, please contact night-coverage at [email or web address], Office  [phone]   08/29/2019, 10:40 AM  LOS: 5 days

## 2019-08-30 ENCOUNTER — Other Ambulatory Visit: Payer: Self-pay | Admitting: *Deleted

## 2019-08-30 ENCOUNTER — Ambulatory Visit: Payer: Medicare Other | Admitting: Cardiovascular Disease

## 2019-08-30 DIAGNOSIS — Z66 Do not resuscitate: Secondary | ICD-10-CM

## 2019-08-30 DIAGNOSIS — I4821 Permanent atrial fibrillation: Secondary | ICD-10-CM

## 2019-08-30 DIAGNOSIS — R195 Other fecal abnormalities: Secondary | ICD-10-CM

## 2019-08-30 LAB — BASIC METABOLIC PANEL
Anion gap: 8 (ref 5–15)
BUN: 14 mg/dL (ref 8–23)
CO2: 38 mmol/L — ABNORMAL HIGH (ref 22–32)
Calcium: 8.2 mg/dL — ABNORMAL LOW (ref 8.9–10.3)
Chloride: 94 mmol/L — ABNORMAL LOW (ref 98–111)
Creatinine, Ser: 1.09 mg/dL — ABNORMAL HIGH (ref 0.44–1.00)
GFR calc Af Amer: 50 mL/min — ABNORMAL LOW (ref >60)
GFR calc non Af Amer: 43 mL/min — ABNORMAL LOW (ref >60)
Glucose, Bld: 115 mg/dL — ABNORMAL HIGH (ref 70–99)
Potassium: 4 mmol/L (ref 3.5–5.1)
Sodium: 140 mmol/L (ref 135–145)

## 2019-08-30 MED ORDER — QUETIAPINE FUMARATE 25 MG PO TABS: 25.0000 mg | ORAL_TABLET | Freq: Every evening | ORAL | Status: DC | PRN

## 2019-08-30 MED ORDER — FUROSEMIDE 40 MG PO TABS: 40.0000 mg | ORAL_TABLET | Freq: Every day | ORAL | 2 refills | Status: DC

## 2019-08-30 MED ORDER — AMLODIPINE BESYLATE 5 MG PO TABS: 5.0000 mg | ORAL_TABLET | Freq: Every day | ORAL | 2 refills | Status: DC

## 2019-08-30 NOTE — Progress Notes (Signed)
Progress Note  Patient Name: Rebecca Skinner Date of Encounter: 08/30/2019  Primary Cardiologist: Shelva Majestic, MD   Subjective   Her stomach is hurting her today, reports she has had a bowel movement but it still hurts. She is unclear as to whether there was any blood in her stool. She reports that her breathing is at baseline. No chest pain.  Inpatient Medications    Scheduled Meds: . amLODipine  5 mg Oral Daily  . aspirin EC  325 mg Oral Daily  . atorvastatin  40 mg Oral q1800  . budesonide  0.5 mg Nebulization Daily  . furosemide  40 mg Oral Daily  . isosorbide mononitrate  30 mg Oral Daily  . magnesium oxide  400 mg Oral Daily  . mouth rinse  15 mL Mouth Rinse BID  . methimazole  5 mg Oral Q M,W,F  . metoprolol tartrate  50 mg Oral BID  . pantoprazole  40 mg Oral Daily  . potassium chloride  10 mEq Oral Daily  . sodium chloride flush  3 mL Intravenous Q12H   Continuous Infusions: . sodium chloride     PRN Meds: sodium chloride, acetaminophen, ipratropium-albuterol, nitroGLYCERIN, ondansetron (ZOFRAN) IV, QUEtiapine, sodium chloride flush   Vital Signs    Vitals:   08/29/19 1918 08/30/19 0010 08/30/19 0327 08/30/19 0711  BP: (!) 122/59  127/66   Pulse: 79  76   Resp: 18  16   Temp: 98.8 F (37.1 C)  98.4 F (36.9 C)   TempSrc: Oral  Oral   SpO2: 100%  91% 100%  Weight:  83.5 kg    Height:        Intake/Output Summary (Last 24 hours) at 08/30/2019 1100 Last data filed at 08/30/2019 0838 Gross per 24 hour  Intake 643 ml  Output 400 ml  Net 243 ml   Last 3 Weights 08/30/2019 08/29/2019 08/28/2019  Weight (lbs) 184 lb 182 lb 189 lb  Weight (kg) 83.462 kg 82.555 kg 85.73 kg      Telemetry    Rate controlled atrial fibrillation - Personally Reviewed  ECG    No new since 3/16 - Personally Reviewed  Physical Exam   GEN: No acute distress.  Sitting comfortably in chair Neck: JVD at clavicle sitting upright Cardiac: irregularly irregular, 2/6 SEM early  peaking Respiratory: Clear to auscultation bilaterally, though doesn't take deep inspirations. GI: Soft, nontender, non-distended  MS: No edema; No deformity. Neuro:  Nonfocal  Psych: Normal affect   Labs    High Sensitivity Troponin:   Recent Labs  Lab 08/24/19 0955 08/24/19 1153  TROPONINIHS 12 11      Chemistry Recent Labs  Lab 08/24/19 0955 08/25/19 0518 08/28/19 0352 08/29/19 0354 08/30/19 0412  NA 143  143   < > 141 141 140  K 4.4  4.3   < > 3.4* 3.7 4.0  CL 105   < > 94* 93* 94*  CO2 28   < > 33* 40* 38*  GLUCOSE 136*   < > 130* 116* 115*  BUN 13   < > 15 14 14   CREATININE 1.01*   < > 1.10* 1.09* 1.09*  CALCIUM 8.6*   < > 8.4* 8.3* 8.2*  PROT 6.5  --   --   --   --   ALBUMIN 3.3*  --   --   --   --   AST 20  --   --   --   --  ALT 16  --   --   --   --   ALKPHOS 78  --   --   --   --   BILITOT 0.6  --   --   --   --   GFRNONAA 48*   < > 43* 43* 43*  GFRAA 55*   < > 50* 50* 50*  ANIONGAP 10   < > 14 8 8    < > = values in this interval not displayed.     Hematology Recent Labs  Lab 08/26/19 0437 08/28/19 0352 08/29/19 0354  WBC 7.9 7.3 5.1  RBC 3.18* 3.23* 3.24*  HGB 9.3* 9.2* 9.3*  HCT 31.1* 31.5* 31.5*  MCV 97.8 97.5 97.2  MCH 29.2 28.5 28.7  MCHC 29.9* 29.2* 29.5*  RDW 16.3* 15.5 15.4  PLT 135* 170 145*    BNP Recent Labs  Lab 08/24/19 0955  BNP 713.6*     DDimer No results for input(s): DDIMER in the last 168 hours.   Radiology    No results found.  Cardiac Studies   Echo 08/25/19: 1. Left ventricular ejection fraction, by estimation, is 60 to 65%. The  left ventricle has normal function. The left ventricle has no regional  wall motion abnormalities. There is moderate left ventricular hypertrophy.  Left ventricular diastolic  parameters are indeterminate. There is the interventricular septum is  flattened in systole and diastole, consistent with right ventricular  pressure and volume overload.  2. Right ventricular  systolic function is mildly reduced. The right  ventricular size is severely enlarged. There is moderately elevated  pulmonary artery systolic pressure. The estimated right ventricular  systolic pressure is 123456 mmHg.  3. Left atrial size was mildly dilated.  4. Right atrial size was severely dilated.  5. The mitral valve is normal in structure. Trivial mitral valve  regurgitation.  6. Tricuspid valve regurgitation is moderate to severe.  7. The aortic valve has been repaired/replaced. Aortic valve  regurgitation is trivial. There is a bioprosthetic valve present in the  aortic position. Normal bioprosthetic function. Mean gradient 51mmHg, EOA  2.1 cm^2, DI 0.62  8. Aortic dilatation noted. There is mild dilatation of the ascending  aorta measuring 37 mm.  9. The inferior vena cava is dilated in size with <50% respiratory  variability, suggesting right atrial pressure of 15 mmHg.  Patient Profile     86 y.o. female with a hx of paroxysmal atrial fibrillation on aspirin, hyperlipidemia, CADs/p CABG X1 andbioprostheticAVR 1998, multiple CVAs,hyperthyroidism on methimazole, diabetes type 2, GI bleed, GERD and dementiawho is being seen today for the evaluation of CHF  Assessment & Plan    Acute on chronic diastolic heart failure with moderate to severe pulmonary hypertension -RVSP 69 mmHg on echo -admission weight 82.3 kg, weight today 83.5 kg. I question the accuracy of these as she appears nearly euvolemic -I/O charted have her as 2.4 L negative -Cr stable at 1.09 -K 4.0 today  Permanent atrial fibrillation -rate controlled on metoprolol tartate 50 mg BID -not on anticoagulation due to severe bleeding history  CAD s/p CABG -on atorvastatin 40 mg -aspirin 325 mg added this admission, was on aspirin 81 mg at home -on imdur 30 mg daily  Bioprosthetic AVR -on aspirin, from CV standpoint ok for 81 mg aspirin dose  History of multiple CVAs -aspirin, statin as  above  Hypertension -on amlodipine 5 mg, decreased this admission -has clonidine 0.1 mg TID PRN as outpatient. Would prefer to avoid this  if possible  [facility] will sign off.   Medication Recommendations:  As per inpatient medications: amlodipine 5 mg daily, atorvastatin 40 mg daily, furosemide 40 mg daily, imdur 30 mg daily, metoprolol 50 mg BID. From a cardiac standpoint, I would recommend aspirin 81 mg instead of 325 mg unless she has another indication. Other recommendations (labs, testing, etc):  none Follow up as an outpatient:  We will arrange for follow up post discharge  For questions or updates, please contact [place] Please consult [email or web address] for contact info under        Signed, Buford Dresser, MD  08/30/2019, 11:00 AM

## 2019-08-30 NOTE — Progress Notes (Addendum)
Physical Therapy Treatment Patient Details Name: Rebecca Skinner MRN: [id number] DOB: 1922/11/05 Today's Date: 08/30/2019    History of Present Illness Pt is a 86 yo female presenting with SOB and chest pain. Upon arrival pt hypoxic on RA with O2 sats in 70s, dx with CHF exacerbation. The pt has been reccently admitted for GIB (3/1-3/2) and symptomatic anemia. PMH includes: HTN, HLD< CAD, hypothyroidism, afib on aspirin, DM II, GERD, an dementia.    PT Comments    Patient seen for mobility progression. Pt sleeping upon arrival but awakens easily and agreeable to participate. Pt's daughter present throughout session. Pt requires min guard/min A for all mobility and daughter gave much of the cues as pt tends to engage with her more than with therapist and tech. Pt able to ambulate 25 ft X 2 trials with rollator and took a seated break on chair in the hallway but denies feeling tired. Pt on 3L O2 via [place]. Given pt's current mobility level HHPT would be appropriate if family can provide 24 hour supervision/assistance initially.  Pt's daughter given gait belt for increased safety with ambulation and transfers when needed. PT will continue to follow acutely and progress as tolerated.     Follow Up Recommendations  SNF     Equipment Recommendations  None recommended by PT    Recommendations for Other Services       Precautions / Restrictions Precautions Precautions: Fall Precaution Comments: watch O2 Restrictions Weight Bearing Restrictions: No    Mobility  Bed Mobility Overal bed mobility: Needs Assistance Bed Mobility: Supine to Sit     Supine to sit: Min guard;HOB elevated     General bed mobility comments: use of rail; cues for sqeuencing/technique; daughter giving cues and encouraging pt to mobilize  Transfers Overall transfer level: Needs assistance Equipment used: (rollator) Transfers: Sit to/from Stand Sit to Stand: Min assist;Min guard         General transfer comment:  pt stood from EOB and then sat back down; daughter and therapist encouraged pt to ambulate; pt stood again from EOB and sat down then stood to ambulate; assist to steady and for safety  Ambulation/Gait Ambulation/Gait assistance: Min assist;Min guard Gait Distance (Feet): (25 ft X 2 with seated break) Assistive device: (rollator) Gait Pattern/deviations: Decreased stride length;Trunk flexed;Step-through pattern Gait velocity: decreased   General Gait Details: cues for safe use of AD; assist to steady; pt sat down in hallway on chair and appears sleepy but denies feelin tired   Stairs             Wheelchair Mobility    Modified Rankin (Stroke Patients Only)       Balance Overall balance assessment: Needs assistance Sitting-balance support: Feet supported Sitting balance-Leahy Scale: Fair     Standing balance support: Bilateral upper extremity supported Standing balance-Leahy Scale: Poor                              Cognition Arousal/Alertness: Awake/alert Behavior During Therapy: Flat affect Overall Cognitive Status: History of cognitive impairments - at baseline                                 General Comments: daughter present; pt responds much better to daughter than therapist but at times not answering questions      Exercises      General Comments  Pertinent Vitals/Pain Pain Assessment: No/denies pain    Home Living                      Prior Function            PT Goals (current goals can now be found in the care plan section) Progress towards PT goals: Progressing toward goals    Frequency    Min 3X/week      PT Plan Current plan remains appropriate    Co-evaluation              AM-PAC PT "6 Clicks" Mobility   Outcome Measure  Help needed turning from your back to your side while in a flat bed without using bedrails?: A Little Help needed moving from lying on your back to sitting on  the side of a flat bed without using bedrails?: A Little Help needed moving to and from a bed to a chair (including a wheelchair)?: A Little Help needed standing up from a chair using your arms (e.g., wheelchair or bedside chair)?: A Little Help needed to walk in hospital room?: A Little Help needed climbing 3-5 steps with a railing? : A Little 6 Click Score: 18    End of Session Equipment Utilized During Treatment: Gait belt;Oxygen Activity Tolerance: Patient limited by fatigue Patient left: in chair;with call bell/phone within reach;with family/visitor present(telesitter) Nurse Communication: Mobility status PT Visit Diagnosis: Unsteadiness on feet (R26.81);Difficulty in walking, not elsewhere classified (R26.2)     Time: [id number] PT Time Calculation (min) (ACUTE ONLY): 35 min  Charges:  $Gait Training: 8-22 mins $Therapeutic Activity: 8-22 mins                     Earney Navy, PTA Acute Rehabilitation Services Pager: [phone] Office: [phone]     Darliss Cheney 08/30/2019, 11:05 AM

## 2019-08-30 NOTE — Progress Notes (Signed)
02 has arrived and paper work complete. Daughter at the beside.

## 2019-08-30 NOTE — TOC Progression Note (Signed)
Transition of Care [facility]) - Progression Note    Patient Details  Name: Rebecca Skinner MRN: [id number] Date of Birth: 28-Sep-1922  Transition of Care [facility]) CM/SW Contact  Zenon Mayo, RN Phone Number: 08/30/2019, 12:37 PM  Clinical Narrative:    NCM spoke with daughter Teyonna Kabacinski, she states she would like home oxygen to be set up with [employer].  NCM made referral to [facility] with [facility] for home oxygen.   Expected Discharge Plan: [place] Barriers to Discharge: Continued Medical Work up  Expected Discharge Plan and Services Expected Discharge Plan: [place]   Discharge Planning Services: CM Consult Post Acute Care Choice: [place] arrangements for the past 2 months: Single Family Home Expected Discharge Date: 08/30/19               DME Arranged: (NA)         HH Arranged: RN [place] Agency: [place] ([place]) Date [place]: 08/25/19 Time [place]: 1653 Representative spoke with at [place]: [facility] ([place]) Interventions    Readmission Risk Interventions No flowsheet data found.

## 2019-08-30 NOTE — Consult Note (Signed)
   [facility] CM Inpatient Consult   08/30/2019  NOA MAULLER 08/14/1922 [id number]   Follow up:  Active patient and Inpatient Transition Of Care team  Chart reviewed for disposition and needs.  Will assign to community [facility] RN Care Coordinator and [facility] Social Worker for follow up.  Spoke with patient's daughter, [name], regarding plans. HIPAA verified.  Primary Care Provider does the transition of care follow up.  Natividad Brood, RN BSN [facility] Liaison  [phone] business mobile phone Toll free office [phone]  Fax number: [phone] [email or web address]

## 2019-08-30 NOTE — Patient Outreach (Signed)
[facility]) Care Management  08/30/2019  ELMEDA SUVER 1923-05-06 [id number]   RN Health Coach Discipline Closure  Referral Date:12/03/2018 Referral Source:[employer] High Risk Screening Reason for Referral:Disease Management Education Insurance:[employer] Medicare   Outreach Attempt:  Patient being discharged home with daughter and home health.  To be followed by [place] and [place] Worker for hospital follow up.  Plan:  RN Health Coach will close Disease Management Case.  RN Health Coach will send primary care provider Discipline Closure Letter.  [place] [phone] [email or web address]

## 2019-08-30 NOTE — Progress Notes (Signed)
SATURATION QUALIFICATIONS: (This note is used to comply with regulatory documentation for home oxygen)  Patient Saturations on Room Air at Rest = 90%  Patient Saturations on Room Air while Ambulating = 85%  Patient Saturations on 2 Liters of oxygen while Ambulating =96 %  Please briefly explain why patient needs home oxygen: patient quickly desats with ambulating

## 2019-08-30 NOTE — TOC Progression Note (Addendum)
Transition of Care [facility]) - Progression Note    Patient Details  Name: Rebecca Skinner MRN: [id number] Date of Birth: 1922/10/16  Transition of Care [facility]) CM/SW [place], [place] Phone Number: 08/30/2019, 11:33 AM  Clinical Narrative:     CSW spoke with PT who reports patient was doing better today but SNF rec still stands, however they discussed with patient's daughter [name] that patient may do better at home given her dementia.   CSW spoke with [name] regarding [facility], and SNF rec. She reports she still wants to take patient home with [facility] that was previously set up. CSW suggested adding a Education officer, museum to [place] patient were to get home and family realize she needs SNF, [name] is agreeable to this.   CSW spoke with [name] with [facility] to inform of updated request of adding social worker to [facility] , will need orders prior to dc.   CSW confirmed with [name] no DME needs, however if patient is to need O2 at time of discharge, she has no home O2 and would need to be set up.   Expected Discharge Plan: [place] Barriers to Discharge: Continued Medical Work up  Expected Discharge Plan and Services Expected Discharge Plan: [place]   Discharge Planning Services: CM Consult Post Acute Care Choice: [place] arrangements for the past 2 months: Single Family Home                 DME Arranged: (NA)         HH Arranged: RN [place] Agency: [place] ([place]) Date [place]: 08/25/19 Time [place]: 1653 Representative spoke with at [place]: [place] ([place]) Interventions    Readmission Risk Interventions No flowsheet data found.

## 2019-08-30 NOTE — Patient Care Conference (Signed)
SATURATION QUALIFICATIONS: (This note is used to comply with regulatory documentation for home oxygen)  Patient Saturations on Room Air at Rest = 90%  Patient Saturations on Room Air while Ambulating = 85%  Patient Saturations on 2 Liters of oxygen while Ambulating =96 %  Please briefly explain why patient needs home oxygen: patient quickly desats with ambulating

## 2019-08-30 NOTE — Care Management Important Message (Signed)
Important Message  Patient Details  Name: RIDWAN MACDONELL MRN: [id number] Date of Birth: 28-Mar-1923   Medicare Important Message Given:  Yes     Shelda Altes 08/30/2019, 10:06 AM

## 2019-08-30 NOTE — Progress Notes (Signed)
Occupational Therapy Treatment Patient Details Name: Rebecca Skinner MRN: [id number] DOB: 1922/08/14 Today's Date: 08/30/2019    History of present illness Pt is a 86 yo female presenting with SOB and chest pain. Upon arrival pt hypoxic on RA with O2 sats in 70s, dx with CHF exacerbation. The pt has been reccently admitted for GIB (3/1-3/2) and symptomatic anemia. PMH includes: HTN, HLD< CAD, hypothyroidism, afib on aspirin, DM II, GERD, an dementia.   OT comments  Pt in recliner upon arrival with daughter present. Session focused on functional mobility with RW, ADL transfers to [facility], standing at sink for functional dynamic tasks. Pt will discharge home this afternoon with her daughter.   Follow Up Recommendations  No OT follow up;Supervision/Assistance - 24 hour    Equipment Recommendations  None recommended by OT    Recommendations for Other Services      Precautions / Restrictions Precautions Precautions: Fall Precaution Comments: watch O2 Restrictions Weight Bearing Restrictions: No       Mobility Bed Mobility Overal bed mobility: Needs Assistance Bed Mobility: Supine to Sit     Supine to sit: Min guard;HOB elevated     General bed mobility comments: pt in recliner upon arrival  Transfers Overall transfer level: Needs assistance Equipment used: Rolling walker (2 wheeled) Transfers: Sit to/from Stand Sit to Stand: Min assist;Min guard         General transfer comment: pt ambulated to [facility] and to sink    Balance Overall balance assessment: Needs assistance Sitting-balance support: Feet supported Sitting balance-Leahy Scale: Fair   Postural control: Posterior lean Standing balance support: Bilateral upper extremity supported;During functional activity Standing balance-Leahy Scale: Poor Standing balance comment: stood at sink for activity                           ADL either performed or assessed with clinical judgement   ADL Overall ADL's : Needs  assistance/impaired     Grooming: Wash/dry hands;Wash/dry face;Minimal assistance;Min guard;Standing;With caregiver independent assisting                   Toilet Transfer: Minimal assistance;Min guard;Ambulation;Cueing for safety;Cueing for sequencing;RW;BSC;With caregiver independent assisting   Toileting- Clothing Manipulation and Hygiene: Moderate assistance;With caregiver independent assisting;Sit to/from stand       Functional mobility during ADLs: Minimal assistance;Min guard;Cueing for safety;Cueing for sequencing;Rolling walker       Vision Baseline Vision/History: Wears glasses Patient Visual Report: No change from baseline     Perception     Praxis      Cognition Arousal/Alertness: Awake/alert Behavior During Therapy: Flat affect Overall Cognitive Status: History of cognitive impairments - at baseline                                 General Comments: daughter present; pt responds much better to daughter than therapist but at times not answering questions        Exercises     Shoulder Instructions       General Comments      Pertinent Vitals/ Pain       Pain Assessment: No/denies pain Faces Pain Scale: No hurt  Home Living  Prior Functioning/Environment              Frequency  Min 2X/week        Progress Toward Goals  OT Goals(current goals can now be found in the care plan section)  Progress towards OT goals: Progressing toward goals     Plan Discharge plan remains appropriate    Co-evaluation                 AM-PAC OT "6 Clicks" Daily Activity     Outcome Measure   Help from another person eating meals?: A Little Help from another person taking care of personal grooming?: A Little Help from another person toileting, which includes using toliet, bedpan, or urinal?: A Lot Help from another person bathing (including washing, rinsing, drying)?:  A Lot Help from another person to put on and taking off regular upper body clothing?: A Little Help from another person to put on and taking off regular lower body clothing?: A Lot 6 Click Score: 15    End of Session Equipment Utilized During Treatment: Gait belt;Rolling walker;Other (comment)(BSC)  OT Visit Diagnosis: Other abnormalities of gait and mobility (R26.89);Muscle weakness (generalized) (M62.81)   Activity Tolerance Patient limited by fatigue   Patient Left with call bell/phone within reach;in chair;with family/visitor present   Nurse Communication          Time: [id number] OT Time Calculation (min): 29 min  Charges:       Britt Bottom 08/30/2019, 2:36 PM

## 2019-08-30 NOTE — Discharge Summary (Signed)
Physician Discharge Summary  Rebecca Skinner [id number] DOB: September 09, 1922 DOA: 08/24/2019  PCP: Colon Branch, MD  Admit date: 08/24/2019 Discharge date: 08/30/2019  Time spent: 50 minutes  Recommendations for Outpatient Follow-up:  1. Follow-up gastroenterology as outpatient 2. Follow-up cardiology as outpatient   Discharge Diagnoses:  Principal Problem:   Acute exacerbation of CHF (congestive heart failure) (HCC) Active Problems:   Hyperthyroidism   Diabetes mellitus with circulatory complication (HCC)   Dyslipidemia   HTN (hypertension)   ATRIAL FIBRILLATION, chronic   Macrocytic anemia   Dementia with behavioral disturbance (HCC)   GI bleed   DNR (do not resuscitate)   Discharge Condition: Stable  Diet recommendation: Heart healthy diet   Filed Weights   08/28/19 0400 08/29/19 0100 08/30/19 0010  Weight: 85.7 kg 82.6 kg 83.5 kg    History of present illness:  86 year old female with a history of hypertension, hyperlipidemia, CAD, hypothyroidism, paroxysmal atrial fibrillation on aspirin, diabetes mellitus type 2, GI bleed, GERD, dementia who presented to ED with complaints of shortness of breath and chest pain.  Patient has dementia, history was obtained from patient's daughter.  Patient became delusional at home and was unable to recognize her family members which was unusual for her.  She was recently mated for symptomatic anemia on 08/09/2019 2 08/09/2020 at that time she was transfused 1 unit PRBC and was discharged home to follow-up with GI.  At that time Plavix was stopped.  Patient has been still having black stools.  She was supposed to follow-up with GI yesterday.  She has been on furosemide 20 mg twice a day.  In route to the ED she was found to be hypoxic with O2 sats down to 76% on room air and improved on 5 L of oxygen via nasal cannula to 96%. Upon admission the ED she was found to be requiring 2 L/min of oxygen via nasal cannula.  BNP 713.6.  Troponin 12.0.  Chest x-ray  concerning for bilateral pleural effusion and basilar airspace disease.  Stool guaiac was positive.  She was given Lasix 40 mg IV in the ED.  Hospital Course:   1. Acute respiratory failure with hypoxia-secondary to diastolic congestive heart failure.  Improved, patient presented with complaints of acute onset of shortness of breath with oxygen saturation noted to be as low as 76% on room air, it improved with oxygen via nasal cannula.  As per daughter Lasix dose was recently changed per cardiology.  BNP was elevated .  Chest x-ray showed bilateral pleural effusion bibasilar airspace disease.  Last EF was 60-65% in August 2020.  Patient was started on Lasix 60 mg IV every 12 hours.   Echocardiogram shows preserved EF, right ventricle dysfunction, elevated PA pressure.  Cardiology was consulted.  Patient has diuresed well with IV Lasix.  Still requiring 2 L/min of oxygen.  Dose of Lasix has been changed to 40 mg p.o. daily.  Cardiology will follow up as outpatient.  2. GI bleed with melena-patient was supposed to follow-up with GI as outpatient but was admitted to the hospital.  Her previous hemoglobin on 08/16/2019 was 9 g/dL.  Today hemoglobin is stable at 9.2 g/dL.  We will keep a close watch and transfuse as needed for hemoglobin less than 7.  As long as hemoglobin remains stable, she can follow-up with GI as outpatient.    3. Hyperthyroidism-continue Tapazole 5 mg on Monday/Wednesday/Friday.  Last T4 was 0.75 on 12/14/2018.Free T4 obtained yesterday, 0.83.  4. Acute kidney injury  on CKD stage IIIa-patient creatinine is worse after starting diuresis.  Today creatinine is 1.09.  Follow BMP in a.m.  5. Dementia with behavior disturbance-stable, ontinue Seroquel prn at bedtime.  6. Paroxysmal atrial fibrillation-patient has been on Plavix and aspirin, but during last hospitalization Plavix was discontinued due to bleeding and she was recommended to continue with aspirin.  Continue with aspirin at this  time.  7. COPD-patient has underlying chronic respiratory failure from COPD.  Has mild wheezing, decreased breath sounds.  Started on  DuoNeb nebulizers every 6 hours.  8. CAD-s/p CABG, stable.  9. Hypertension-blood pressure stable, continue metoprolol, amlodipine, furosemide.  10. Diabetes mellitus type 2-diet controlled.  Last A1c was 5.9 on 08/10/2019.  42. CODE STATUS-patient is DNR, palliative care has been already involved and they were going to follow-up with patient and the family on 08/27/2019 as outpatient.  Will need to reschedule her appointment.  Procedures:    Consultations:  Cardiology  Discharge Exam: Vitals:   08/30/19 0327 08/30/19 0711  BP: 127/66   Pulse: 76   Resp: 16   Temp: 98.4 F (36.9 C)   SpO2: 91% 100%    General: Appears in no acute distress Cardiovascular: S1-S2, regular Respiratory: Clear to auscultation bilaterally  Discharge Instructions   Discharge Instructions    Diet - low sodium heart healthy   Complete by: As directed    Increase activity slowly   Complete by: As directed      Allergies as of 08/30/2019      Reactions   Hydrocodone Itching, Nausea Only   Tramadol Hcl Itching, Nausea Only      Medication List    STOP taking these medications   cloNIDine 0.1 MG tablet Commonly known as: CATAPRES     TAKE these medications   acetaminophen 500 MG tablet Commonly known as: TYLENOL Take 1,000 mg by mouth every 6 (six) hours as needed (for pain).   albuterol 0.63 MG/3ML nebulizer solution Commonly known as: ACCUNEB Take 3 mLs by nebulization every 6 (six) hours as needed for wheezing or shortness of breath.   amLODipine 5 MG tablet Commonly known as: NORVASC Take 1 tablet (5 mg total) by mouth daily. Start taking on: August 31, 2019 What changed:   medication strength  how much to take   aspirin EC 81 MG tablet Take 1 tablet (81 mg total) by mouth daily.   atorvastatin 40 MG tablet Commonly known as:  LIPITOR TAKE 1 TABLET BY MOUTH EVERYDAY AT BEDTIME What changed: See the new instructions.   bismuth subsalicylate 99991111 MG chewable tablet Commonly known as: PEPTO BISMOL Chew 524 mg by mouth as needed for indigestion or diarrhea or loose stools.   budesonide 0.5 MG/2ML nebulizer solution Commonly known as: PULMICORT Take 2 mLs (0.5 mg total) by nebulization daily.   Calcium 500 + D 500-125 MG-UNIT Tabs Generic drug: Calcium Carbonate-Vitamin D Take 1 tablet by mouth at bedtime.   esomeprazole 40 MG capsule Commonly known as: NEXIUM Take 1 capsule (40 mg total) by mouth daily as needed.   furosemide 40 MG tablet Commonly known as: LASIX Take 1 tablet (40 mg total) by mouth daily. Start taking on: August 31, 2019 What changed:   medication strength  how much to take   isosorbide mononitrate 30 MG 24 hr tablet Commonly known as: IMDUR TAKE 1 TABLET BY MOUTH EVERY DAY   magnesium oxide 400 MG tablet Commonly known as: MAG-OX Take 1 tablet (400 mg total) by mouth daily.  methimazole 5 MG tablet Commonly known as: TAPAZOLE TAKE 1 TABLET BY MOUTH 3 TIMES A WEEK What changed: See the new instructions.   metoprolol tartrate 50 MG tablet Commonly known as: LOPRESSOR Take 1 tablet (50 mg total) by mouth 2 (two) times daily.   nitroGLYCERIN 0.4 MG SL tablet Commonly known as: NITROSTAT Place 1 tablet (0.4 mg total) under the tongue every 5 (five) minutes x 3 doses as needed for chest pain.   potassium chloride 10 MEQ tablet Commonly known as: Klor-Con M10 Take 1 tablet (10 mEq total) by mouth daily.   QUEtiapine 25 MG tablet Commonly known as: SEROquel Take 1 tablet (25 mg total) by mouth at bedtime.            Durable Medical Equipment  (From admission, onward)         Start     Ordered   08/30/19 1138  For home use only DME oxygen  Once    Question Answer Comment  Length of Need Lifetime   Mode or (Route) Nasal cannula   Liters per Minute 2   Frequency  Continuous (stationary and portable oxygen unit needed)   Oxygen conserving device Yes   Oxygen delivery system Gas      08/30/19 1137         Allergies  Allergen Reactions  . Hydrocodone Itching and Nausea Only  . Tramadol Hcl Itching and Nausea Only   Follow-up Information    [facility] Follow up.   WhyMelina Schools, HHPT  [place], [place], [place]. Go on 09/20/2019.   Specialties: Physician Assistant, Cardiology, Radiology Why: @2 :45am for hospital follow up with Dr. Evette Georges PA Contact information: [address] [place] [address] [phone]            The results of significant diagnostics from this hospitalization (including imaging, microbiology, ancillary and laboratory) are listed below for reference.    Significant Diagnostic Studies: DG Chest Portable 1 View  Result Date: 08/24/2019 CLINICAL DATA:  Shortness of breath. EXAM: PORTABLE CHEST 1 VIEW COMPARISON:  08/09/2019 FINDINGS: Right and left hemidiaphragm are obscured greater on the left than the right. Signs of median sternotomy and CABG with aortic valve replacement with stable cardiomediastinal contour enlargement. Linear areas of airspace disease as developed since the prior study in the right mid chest and in the left mid chest. Graded opacity at the right and left lung base. No significant skeletal findings aside from median sternotomy. IMPRESSION: Signs of bilateral effusions and basilar airspace disease with interval development of areas of atelectatic change in the mid chest and with worsening opacification in the lung bases particularly in the retrocardiac region. Electronically Signed   By: Zetta Bills M.D.   On: 08/24/2019 10:31   DG Chest Portable 1 View  Result Date: 08/09/2019 CLINICAL DATA:  Shortness of breath EXAM: PORTABLE CHEST 1 VIEW COMPARISON:  04/13/2018 FINDINGS: Probable mild bibasilar atelectasis. No significant pleural effusion. No pneumothorax  stable cardiomegaly. Evidence of prior CABG. Calcified plaque along the thoracic aorta. IMPRESSION: Mild bibasilar atelectasis. Electronically Signed   By: Macy Mis M.D.   On: 08/09/2019 14:35   ECHOCARDIOGRAM COMPLETE  Result Date: 08/25/2019    ECHOCARDIOGRAM REPORT   Patient Name:   ELISSIA MILANOVICH Date of Exam: 08/25/2019 Medical Rec #:  [id number]   Height:       62.0 in Accession #:    [id number]  Weight:  182.3 lb Date of Birth:  1922-12-05    BSA:          1.838 m Patient Age:    18 years    BP:           155/63 mmHg Patient Gender: F           HR:           65 bpm. Exam Location:  Inpatient Procedure: 2D Echo and Intracardiac Opacification Agent Indications:    CHF-Acute Diastolic [id number] / [id number]  History:        Patient has prior history of Echocardiogram examinations, most                 recent 01/27/2019. CAD, Pacemaker and Prior CABG, TIA and Stroke,                 Arrythmias:Atrial Fibrillation and sick sinus syndrome; Risk                 Factors:Hypertension, Dyslipidemia and Diabetes. GERD. Dementia.                 Aortic Valve: unknown pericardial valve is present in the aortic                 position. Procedure Date: 1998.  Sonographer:    Darlina Sicilian RDCS Referring Phys: [id number] RONDELL A SMITH  Sonographer Comments: Definity attempted, nevery returned to the heart, nurse made aware. IMPRESSIONS  1. Left ventricular ejection fraction, by estimation, is 60 to 65%. The left ventricle has normal function. The left ventricle has no regional wall motion abnormalities. There is moderate left ventricular hypertrophy. Left ventricular diastolic parameters are indeterminate. There is the interventricular septum is flattened in systole and diastole, consistent with right ventricular pressure and volume overload.  2. Right ventricular systolic function is mildly reduced. The right ventricular size is severely enlarged. There is moderately elevated pulmonary artery systolic pressure. The  estimated right ventricular systolic pressure is 123456 mmHg.  3. Left atrial size was mildly dilated.  4. Right atrial size was severely dilated.  5. The mitral valve is normal in structure. Trivial mitral valve regurgitation.  6. Tricuspid valve regurgitation is moderate to severe.  7. The aortic valve has been repaired/replaced. Aortic valve regurgitation is trivial. There is a bioprosthetic valve present in the aortic position. Normal bioprosthetic function. Mean gradient 30mmHg, EOA 2.1 cm^2, DI 0.62  8. Aortic dilatation noted. There is mild dilatation of the ascending aorta measuring 37 mm.  9. The inferior vena cava is dilated in size with <50% respiratory variability, suggesting right atrial pressure of 15 mmHg. FINDINGS  Left Ventricle: Left ventricular ejection fraction, by estimation, is 60 to 65%. The left ventricle has normal function. The left ventricle has no regional wall motion abnormalities. Definity contrast agent was given IV to delineate the left ventricular  endocardial borders. The left ventricular internal cavity size was small. There is moderate left ventricular hypertrophy. The interventricular septum is flattened in systole and diastole, consistent with right ventricular pressure and volume overload. Left ventricular diastolic parameters are indeterminate. Right Ventricle: The right ventricular size is severely enlarged. Right vetricular wall thickness was not assessed. Right ventricular systolic function is mildly reduced. There is moderately elevated pulmonary artery systolic pressure. The tricuspid regurgitant velocity is 3.67 m/s, and with an assumed right atrial pressure of 15 mmHg, the estimated right ventricular systolic pressure is 123456 mmHg. Left Atrium: Left atrial size was mildly dilated. Right  Atrium: Right atrial size was severely dilated. Pericardium: Trivial pericardial effusion is present. Mitral Valve: The mitral valve is normal in structure. Trivial mitral valve  regurgitation. MV peak gradient, 13.5 mmHg. The mean mitral valve gradient is 4.5 mmHg. Tricuspid Valve: The tricuspid valve is normal in structure. Tricuspid valve regurgitation is moderate to severe. Aortic Valve: The aortic valve has been repaired/replaced. Aortic valve regurgitation is trivial. Aortic valve mean gradient measures 9.0 mmHg. Aortic valve peak gradient measures 18.9 mmHg. Aortic valve area, by VTI measures 1.92 cm. There is a unknown  pericardial valve present in the aortic position. Procedure Date: 1998. Pulmonic Valve: The pulmonic valve was grossly normal. Pulmonic valve regurgitation is mild. Aorta: Aortic dilatation noted. There is mild dilatation of the ascending aorta measuring 37 mm. Venous: The inferior vena cava is dilated in size with less than 50% respiratory variability, suggesting right atrial pressure of 15 mmHg. IAS/Shunts: The interatrial septum was not well visualized.  LEFT VENTRICLE PLAX 2D LVIDd:         3.30 cm LVIDs:         2.80 cm LV PW:         1.00 cm LV IVS:        1.20 cm LVOT diam:     1.97 cm LV SV:         77 LV SV Index:   42 LVOT Area:     3.04 cm  LEFT ATRIUM           Index       RIGHT ATRIUM           Index LA diam:      3.70 cm 2.01 cm/m  RA Area:     31.30 cm LA Vol (A4C): 72.4 ml 39.39 ml/m RA Volume:   128.00 ml 69.64 ml/m  AORTIC VALVE AV Area (Vmax):    1.82 cm AV Area (Vmean):   1.69 cm AV Area (VTI):     1.92 cm AV Vmax:           217.50 cm/s AV Vmean:          141.500 cm/s AV VTI:            0.403 m AV Peak Grad:      18.9 mmHg AV Mean Grad:      9.0 mmHg LVOT Vmax:         130.00 cm/s LVOT Vmean:        78.800 cm/s LVOT VTI:          0.255 m LVOT/AV VTI ratio: 0.63  AORTA Ao Root diam: 3.00 cm Ao Asc diam:  3.70 cm MITRAL VALVE                TRICUSPID VALVE MV Area (PHT): 2.69 cm     TR Peak grad:   53.9 mmHg MV Peak grad:  13.5 mmHg    TR Vmax:        367.00 cm/s MV Mean grad:  4.5 mmHg MV Vmax:       1.84 m/s     SHUNTS MV Vmean:      97.6  cm/s    Systemic VTI:  0.26 m MV Decel Time: 282 msec     Systemic Diam: 1.97 cm MV E velocity: 173.50 cm/s Oswaldo Milian MD Electronically signed by Oswaldo Milian MD Signature Date/Time: 08/25/2019/1:29:11 PM    Final     Microbiology: Recent Results (from the past 240 hour(s))  SARS  CORONAVIRUS 2 (TAT 6-24 HRS) Nasopharyngeal Nasopharyngeal Swab     Status: None   Collection Time: 08/24/19 12:55 PM   Specimen: Nasopharyngeal Swab  Result Value Ref Range Status   SARS Coronavirus 2 NEGATIVE NEGATIVE Final    Comment: (NOTE) SARS-CoV-2 target nucleic acids are NOT DETECTED. The SARS-CoV-2 RNA is generally detectable in upper and lower respiratory specimens during the acute phase of infection. Negative results do not preclude SARS-CoV-2 infection, do not rule out co-infections with other pathogens, and should not be used as the sole basis for treatment or other patient management decisions. Negative results must be combined with clinical observations, patient history, and epidemiological information. The expected result is Negative. Fact Sheet for Patients: SugarRoll.be Fact Sheet for Healthcare Providers: https://www.woods-mathews.com/ This test is not yet approved or cleared by the [place] FDA and  has been authorized for detection and/or diagnosis of SARS-CoV-2 by FDA under an Emergency Use Authorization (EUA). This EUA will remain  in effect (meaning this test can be used) for the duration of the COVID-19 declaration under Section 56 4(b)(1) of the Act, 21 U.S.C. section 360bbb-3(b)(1), unless the authorization is terminated or revoked sooner. Performed at [facility], [place] [address], [place], [place] [address]      Labs: Basic Metabolic Panel: Recent Labs  Lab 08/25/19 0518 08/25/19 0518 08/26/19 [id number] 08/27/19 0347 08/28/19 0352 08/29/19 0354 08/30/19 0412  NA 142   < > 143 143 141 141 140  K 4.4   <  > 4.3 4.0 3.4* 3.7 4.0  CL 101   < > 95* 95* 94* 93* 94*  CO2 28   < > 34* 36* 33* 40* 38*  GLUCOSE 105*   < > 117* 121* 130* 116* 115*  BUN 13   < > 15 15 15 14 14   CREATININE 1.02*   < > 1.31* 1.31* 1.10* 1.09* 1.09*  CALCIUM 8.3*   < > 8.5* 8.4* 8.4* 8.3* 8.2*  MG 1.8  --   --   --   --   --   --    < > = values in this interval not displayed.   Liver Function Tests: Recent Labs  Lab 08/24/19 0955  AST 20  ALT 16  ALKPHOS 78  BILITOT 0.6  PROT 6.5  ALBUMIN 3.3*   No results for input(s): LIPASE, AMYLASE in the last 168 hours. No results for input(s): AMMONIA in the last 168 hours. CBC: Recent Labs  Lab 08/24/19 0955 08/25/19 0518 08/26/19 0437 08/28/19 0352 08/29/19 0354  WBC 7.5 7.8 7.9 7.3 5.1  NEUTROABS 6.0  --   --   --   --   HGB 9.5*  11.9* 9.4* 9.3* 9.2* 9.3*  HCT 33.8*  35.0* 33.0* 31.1* 31.5* 31.5*  MCV 101.8* 102.8* 97.8 97.5 97.2  PLT 172 144* 135* 170 145*   Cardiac Enzymes: No results for input(s): CKTOTAL, CKMB, CKMBINDEX, TROPONINI in the last 168 hours. BNP: BNP (last 3 results) Recent Labs    05/22/19 2200 08/24/19 0955  BNP 698.3* 713.6*    ProBNP (last 3 results) No results for input(s): PROBNP in the last 8760 hours.  CBG: Recent Labs  Lab 08/24/19 0941 08/24/19 1007  GLUCAP 121* 117*       Signed:  Oswald Hillock MD.  [facility] 08/30/2019, 11:41 AM

## 2019-08-30 NOTE — Plan of Care (Signed)

## 2019-08-30 NOTE — Progress Notes (Signed)
SATURATION QUALIFICATIONS: (This note is used to comply with regulatory documentation for home oxygen)  Patient Saturations on  at Rest = 87% on 2 L  Patient Saturations on Room Air while Ambulating = %  Patient Saturations on 3 Liters of oxygen while sitting  = 92%  Please briefly explain why patient needs home oxygen:

## 2019-08-31 ENCOUNTER — Other Ambulatory Visit: Payer: Self-pay

## 2019-08-31 ENCOUNTER — Ambulatory Visit: Payer: Medicare Other | Admitting: Internal Medicine

## 2019-09-01 ENCOUNTER — Telehealth: Payer: Self-pay | Admitting: Internal Medicine

## 2019-09-01 ENCOUNTER — Telehealth: Payer: Self-pay

## 2019-09-01 DIAGNOSIS — I5082 Biventricular heart failure: Secondary | ICD-10-CM | POA: Diagnosis not present

## 2019-09-01 DIAGNOSIS — E1159 Type 2 diabetes mellitus with other circulatory complications: Secondary | ICD-10-CM | POA: Diagnosis not present

## 2019-09-01 DIAGNOSIS — K922 Gastrointestinal hemorrhage, unspecified: Secondary | ICD-10-CM | POA: Diagnosis not present

## 2019-09-01 DIAGNOSIS — I509 Heart failure, unspecified: Secondary | ICD-10-CM | POA: Diagnosis not present

## 2019-09-01 DIAGNOSIS — I272 Pulmonary hypertension, unspecified: Secondary | ICD-10-CM | POA: Diagnosis not present

## 2019-09-01 DIAGNOSIS — I252 Old myocardial infarction: Secondary | ICD-10-CM | POA: Diagnosis not present

## 2019-09-01 DIAGNOSIS — I110 Hypertensive heart disease with heart failure: Secondary | ICD-10-CM | POA: Diagnosis not present

## 2019-09-01 DIAGNOSIS — J9601 Acute respiratory failure with hypoxia: Secondary | ICD-10-CM | POA: Diagnosis not present

## 2019-09-01 DIAGNOSIS — E785 Hyperlipidemia, unspecified: Secondary | ICD-10-CM | POA: Diagnosis not present

## 2019-09-01 DIAGNOSIS — I2510 Atherosclerotic heart disease of native coronary artery without angina pectoris: Secondary | ICD-10-CM | POA: Diagnosis not present

## 2019-09-01 DIAGNOSIS — I5033 Acute on chronic diastolic (congestive) heart failure: Secondary | ICD-10-CM | POA: Diagnosis not present

## 2019-09-01 DIAGNOSIS — J449 Chronic obstructive pulmonary disease, unspecified: Secondary | ICD-10-CM | POA: Diagnosis not present

## 2019-09-01 DIAGNOSIS — E039 Hypothyroidism, unspecified: Secondary | ICD-10-CM | POA: Diagnosis not present

## 2019-09-01 DIAGNOSIS — I480 Paroxysmal atrial fibrillation: Secondary | ICD-10-CM | POA: Diagnosis not present

## 2019-09-01 NOTE — Telephone Encounter (Signed)
Brandon from [facility] care called in to get verbal orders for Occupational therapy for Once a week for 4 weeks. Please call Erlene Quan back at [phone]

## 2019-09-01 NOTE — Telephone Encounter (Signed)
[place]  Requesting Verbal for  Physical Therapy   1 week one 2 week 3 times  .Marland KitchenMarland Kitchen

## 2019-09-01 NOTE — Telephone Encounter (Signed)
Spoke w/ Brandon- verbal orders given.  

## 2019-09-01 NOTE — Telephone Encounter (Signed)
LMOM for Kelly w/ verbal orders.  

## 2019-09-02 ENCOUNTER — Other Ambulatory Visit: Payer: Self-pay | Admitting: *Deleted

## 2019-09-02 DIAGNOSIS — I5082 Biventricular heart failure: Secondary | ICD-10-CM | POA: Diagnosis not present

## 2019-09-02 DIAGNOSIS — J9601 Acute respiratory failure with hypoxia: Secondary | ICD-10-CM | POA: Diagnosis not present

## 2019-09-02 DIAGNOSIS — J449 Chronic obstructive pulmonary disease, unspecified: Secondary | ICD-10-CM | POA: Diagnosis not present

## 2019-09-02 DIAGNOSIS — I110 Hypertensive heart disease with heart failure: Secondary | ICD-10-CM | POA: Diagnosis not present

## 2019-09-02 DIAGNOSIS — E1159 Type 2 diabetes mellitus with other circulatory complications: Secondary | ICD-10-CM | POA: Diagnosis not present

## 2019-09-02 DIAGNOSIS — E785 Hyperlipidemia, unspecified: Secondary | ICD-10-CM | POA: Diagnosis not present

## 2019-09-02 DIAGNOSIS — I480 Paroxysmal atrial fibrillation: Secondary | ICD-10-CM | POA: Diagnosis not present

## 2019-09-02 DIAGNOSIS — K922 Gastrointestinal hemorrhage, unspecified: Secondary | ICD-10-CM | POA: Diagnosis not present

## 2019-09-02 DIAGNOSIS — E039 Hypothyroidism, unspecified: Secondary | ICD-10-CM | POA: Diagnosis not present

## 2019-09-02 DIAGNOSIS — I272 Pulmonary hypertension, unspecified: Secondary | ICD-10-CM | POA: Diagnosis not present

## 2019-09-02 DIAGNOSIS — I5033 Acute on chronic diastolic (congestive) heart failure: Secondary | ICD-10-CM | POA: Diagnosis not present

## 2019-09-02 DIAGNOSIS — I252 Old myocardial infarction: Secondary | ICD-10-CM | POA: Diagnosis not present

## 2019-09-02 DIAGNOSIS — I2510 Atherosclerotic heart disease of native coronary artery without angina pectoris: Secondary | ICD-10-CM | POA: Diagnosis not present

## 2019-09-02 NOTE — Patient Outreach (Addendum)
[facility]) Care Management  09/02/2019  [name] 09/04/1922 [id number]  Referral Received 3/22 Initial outreach 3/25 Telephone Assessment Primary provider completing Transition of care call  RN spoke with pt's daughter [name]) who discussed pt's current treatment with involved services with [facility] with PT/OT in the home. States pt will received these services for the next 3 weeks. RN further introduced the reason for pt transitioning from a health coach to a telephone case manager with [facility]. RN further engaged in pt's ongoing medical issues and inquired on further assistance for pt's needs. RN inquired on pt's HF for daily weights and monitoring. Caregiver indicates she makes sure pt's weights daily. RN educated on fluid retention and what to do if acute symptoms occur.  Other to further education on HF and/or prevention measures concerning her dementia.  Caregiver indicates she would like to enroll in a program however did not wish to pursue this until [facility]. Daughter requested a call back in 3 weeks to engage further.  Plan: RN will follow up in 3 weeks to further engage with ongoing enrollment for program acceptance.  Raina Mina, RN Care Management Coordinator [place] Office [phone]

## 2019-09-02 NOTE — Patient Outreach (Signed)
[facility]) Care Management  09/02/2019  Rebecca Skinner 10/19/1922 [id number]   CSW made initial phone outreach call to pt. Per daughter, [name], she is her RP and confirmed pt's identity. Pt resides with daughter and per daughter pt was released from hospital back home with [facility] arranged; "we are getting so many calls".  CSW inquired with daughter about any barriers or concerns related to caring for pt at home. She denies any concerns or needs; stating she assists pt with groceries, meds, transportation,etc.  Call ended early due to an incoming call from PCP per daughter.  CSW will touch base again next week to further assess for needs and to see how things are going at home.    Eduard Clos, MSW, [place] Worker  [place] [phone]

## 2019-09-06 ENCOUNTER — Ambulatory Visit (INDEPENDENT_AMBULATORY_CARE_PROVIDER_SITE_OTHER): Payer: Medicare Other | Admitting: Internal Medicine

## 2019-09-06 ENCOUNTER — Encounter: Payer: Self-pay | Admitting: Internal Medicine

## 2019-09-06 VITALS — BP 134/62 | Ht 63.0 in | Wt 184.0 lb

## 2019-09-06 DIAGNOSIS — I10 Essential (primary) hypertension: Secondary | ICD-10-CM

## 2019-09-06 DIAGNOSIS — R5381 Other malaise: Secondary | ICD-10-CM | POA: Diagnosis not present

## 2019-09-06 DIAGNOSIS — K921 Melena: Secondary | ICD-10-CM | POA: Diagnosis not present

## 2019-09-06 DIAGNOSIS — I5033 Acute on chronic diastolic (congestive) heart failure: Secondary | ICD-10-CM | POA: Diagnosis not present

## 2019-09-06 NOTE — Progress Notes (Signed)
Subjective:    Patient ID: Rebecca Skinner, female    DOB: [birth year], 86 y.o.   MRN: [id number]  DOS:  09/06/2019 Type of visit - description: Virtual Visit via Telephone  Attempted  to make this a video visit, due to technical difficulties from the patient side it was not possible  thus we proceeded with a Virtual Visit via Telephone    I connected with above mentioned patient  by telephone and verified that I am speaking with the correct person using two identifiers.  THIS ENCOUNTER IS A VIRTUAL VISIT DUE TO COVID-19 - PATIENT WAS NOT SEEN IN THE OFFICE. PATIENT HAS CONSENTED TO VIRTUAL VISIT / TELEMEDICINE VISIT   Location of patient: home  Location of provider: office  I discussed the limitations, risks, security and privacy concerns of performing an evaluation and management service by telephone and the availability of in person appointments. I also discussed with the patient that there may be a patient responsible charge related to this service. The patient expressed understanding and agreed to proceed.   Hospital follow-up Admitted 08/24/2019, discharge 08/30/2019. Patient presented to the ED with CP and SOB. She become also delusional at home and had mental status changes prior to admission. EMS found her hypoxic. Initial evaluation showed a chest x-ray with bilateral pleural effusion, BNP was elevated at 700. Recently had a GI bleed, stool guaiac was still positive. She was diagnosed with acute respiratory failure with hypoxia due to heart failure. Was treated with IV Lasix, eventually discharged on Lasix by mouth. As far as the GI bleed, anemia, hemoglobin remained stable at 9.2.  Will need follow-up as an outpatient.  Last BMP: Potassium 4.0, creatinine 1.09 Last hemoglobin 9.3 (about 8 days ago)  I spoke mostly with her daughter Renatha. Since she left the hospital good compliance with medication No fever. Appetite is okay They are monitoring her weight, no lower extremity  edema No chest pain difficulty breathing or cough. Stools continue to be black but no bloody. No nausea or vomiting. No behavioral issues since the admission.   Review of Systems See above   Past Medical History:  Diagnosis Date  . Anxiety 11/20/2011  . Asthma    [facility] DR Farrel Guimond  . CAD (coronary artery disease)    s/p CABG s/p AO valve replacement ([place] [facility])  . CVA (cerebral infarction) 08-2013   multiple, L, d/t Afib, started eloquis  . Diabetes mellitus    type 2   . Dizziness    Chronic, admiet 07-2010,saw neuro, thought to be a peripheral issue   . Dry skin   . GERD (gastroesophageal reflux disease)   . Hyperlipidemia   . Hypertension   . Hyperthyroidism   . Keloid    @ chest  . Memory loss   . Osteoarthritis   . Osteopenia    per dexa 12/09  . Paroxysmal atrial fibrillation ([place])    cards d/c coumadin 12/2008 d/t persisten NSR and frequent falls- restarted coumadin july 2011, now on Eliquis  . Recurrent UTI   . Shortness of breath dyspnea   . TIA (transient ischemic attack)     Past Surgical History:  Procedure Laterality Date  . ABDOMINAL HYSTERECTOMY    . AORTIC VALVE REPLACEMENT  1998  . CARDIAC VALVE REPLACEMENT    . CAROTID DOPPLER  07/13/12   BILATERAL BULB/PROXIMAL ICAS;MILD AMOUNT OF FIBROUS PLAQUE WITH NO DIAMETER REDUCTION.  . CESAREAN SECTION     x 2  .  COLONOSCOPY WITH PROPOFOL N/A 03/30/2014   Procedure: COLONOSCOPY WITH PROPOFOL;  Surgeon: Irene Shipper, MD;  Location: [facility];  Service: Endoscopy;  Laterality: N/A;  . CORONARY ARTERY BYPASS GRAFT  1998   SVG TO RCA  . HEMORRHOID SURGERY    . JOINT REPLACEMENT    . MYOCARDIAL PERFUSION STUDY  12/19/09   NORMAL PATTERN OF PERFUSION IN ALL REGIONS.EF 75%.  . OOPHORECTOMY    . TOTAL KNEE ARTHROPLASTY  1999  . TRANSTHORACIC ECHOCARDIOGRAM  09/11/11   LVEF >55%.STAGE 1 DIASTOLIC DYSFUNCTION,ELEVATED LV FILLING PRESSURE.BIOPROSTHETIC AORTIC VALVE-PEAK AND MEAN GRADIENTS OF 17 MMHG  AND 8 MMHG.SIGMOID SEPTUM.MILD TO MOD MR.MILD TO MOD TR.RVSP 46 MMHG.    Allergies as of 09/06/2019      Reactions   Hydrocodone Itching, Nausea Only   Tramadol Hcl Itching, Nausea Only      Medication List       Accurate as of September 06, 2019 11:59 PM. If you have any questions, ask your nurse or doctor.        acetaminophen 500 MG tablet Commonly known as: TYLENOL Take 1,000 mg by mouth every 6 (six) hours as needed (for pain).   albuterol 0.63 MG/3ML nebulizer solution Commonly known as: ACCUNEB Take 3 mLs by nebulization every 6 (six) hours as needed for wheezing or shortness of breath.   amLODipine 5 MG tablet Commonly known as: NORVASC Take 1 tablet (5 mg total) by mouth daily.   aspirin EC 81 MG tablet Take 1 tablet (81 mg total) by mouth daily.   atorvastatin 40 MG tablet Commonly known as: LIPITOR TAKE 1 TABLET BY MOUTH EVERYDAY AT BEDTIME What changed: See the new instructions.   bismuth subsalicylate 99991111 MG chewable tablet Commonly known as: PEPTO BISMOL Chew 524 mg by mouth as needed for indigestion or diarrhea or loose stools.   budesonide 0.5 MG/2ML nebulizer solution Commonly known as: PULMICORT Take 2 mLs (0.5 mg total) by nebulization daily.   Calcium 500 + D 500-125 MG-UNIT Tabs Generic drug: Calcium Carbonate-Vitamin D Take 1 tablet by mouth at bedtime.   esomeprazole 40 MG capsule Commonly known as: NEXIUM Take 1 capsule (40 mg total) by mouth daily as needed.   furosemide 40 MG tablet Commonly known as: LASIX Take 1 tablet (40 mg total) by mouth daily.   isosorbide mononitrate 30 MG 24 hr tablet Commonly known as: IMDUR TAKE 1 TABLET BY MOUTH EVERY DAY   magnesium oxide 400 MG tablet Commonly known as: MAG-OX Take 1 tablet (400 mg total) by mouth daily.   methimazole 5 MG tablet Commonly known as: TAPAZOLE TAKE 1 TABLET BY MOUTH 3 TIMES A WEEK What changed: See the new instructions.   metoprolol tartrate 50 MG tablet Commonly  known as: LOPRESSOR Take 1 tablet (50 mg total) by mouth 2 (two) times daily.   nitroGLYCERIN 0.4 MG SL tablet Commonly known as: NITROSTAT Place 1 tablet (0.4 mg total) under the tongue every 5 (five) minutes x 3 doses as needed for chest pain.   potassium chloride 10 MEQ tablet Commonly known as: [place]-Con M10 Take 1 tablet (10 mEq total) by mouth daily.   QUEtiapine 25 MG tablet Commonly known as: SEROquel Take 1 tablet (25 mg total) by mouth at bedtime.          Objective:   Physical Exam BP 134/62   Ht 5\' 3"  (1.6 m)   Wt 184 lb (83.5 kg)   BMI 32.59 kg/m  This is a telephone  evaluation, the patient was sleeping, information was obtained from the daughter who was at the bedside, I was asked not to wake her up.    Assessment     Assessment DM HTN Hyperlipidemia Hyperthyroidism -- used to see Dr. Loanne Drilling, last visit 01-2016, now f/u PCP, check labs x 2 q year GERD Asthma  Anxiety on xanax  CV: Dr. Claiborne Billings --CHF, CAD, CABG, Aortic valve replacement --P- atrial fibrillation, used to be on Coumadin, Eliquis rx after the stroke 3-15, d/c 03-2014 d/t GI bleed , on ASA 81. Dx TIA 08-2016, back on eliquis, asa d/c.   NEURO --TIAs (admited 08-2016), CVA 2015 --Dementia MMSE ~ 12 to 15  (12-2016) DJD Osteopenia 2009 Recurrent UTIs Daughter  Chatherine Planas; lives w/ [name]  PLAN   Acute respiratory failure with hypoxia: Since the hospital discharge, she is at home, and seems to be stable, still have some oxygen, the family cannot check her O2 sats. CHF,CAD, P-AFib: Respiratory failure was due to diastolic CHF, currently on furosemide, metoprolol, potassium, aspirin, amlodipine.  Has gained 2.4 pounds in the last week but has no lower extremity edema. Recommend to continue monitoring weights and if the trend continues to contact cardiology or me. HTN: BP today 134/62, pulse 74 GI bleed: Currently holding anticoagulants, only on aspirin, still having black stools but the last  hemoglobin of the hospital was a stable, has a follow-up with GI 09/09/2019. Dementia: Currently on Seroquel, no recent issues. General debility, advanced care planning: Again discussed with Stanton Kidney the importance of think about advance care planning.  I offered them my opinion which is no aggressive measures. She needs a  BMP and CBC within the next few days, if that is not done we will try to obtain them from this office.    I discussed the assessment and treatment plan with the patient. The patient was provided an opportunity to ask questions and all were answered. The patient agreed with the plan and demonstrated an understanding of the instructions.   The patient was advised to call back or seek an in-person evaluation if the symptoms worsen or if the condition fails to improve as anticipated.  I provided 25 minutes of non-face-to-face time during this encounter.  Kathlene November, MD

## 2019-09-06 NOTE — Progress Notes (Signed)
Pre visit review using our clinic review tool, if applicable. No additional management support is needed unless otherwise documented below in the visit note. 

## 2019-09-07 ENCOUNTER — Other Ambulatory Visit: Payer: Self-pay | Admitting: Internal Medicine

## 2019-09-07 DIAGNOSIS — E785 Hyperlipidemia, unspecified: Secondary | ICD-10-CM | POA: Diagnosis not present

## 2019-09-07 DIAGNOSIS — I5082 Biventricular heart failure: Secondary | ICD-10-CM | POA: Diagnosis not present

## 2019-09-07 DIAGNOSIS — I5033 Acute on chronic diastolic (congestive) heart failure: Secondary | ICD-10-CM | POA: Diagnosis not present

## 2019-09-07 DIAGNOSIS — E1159 Type 2 diabetes mellitus with other circulatory complications: Secondary | ICD-10-CM | POA: Diagnosis not present

## 2019-09-07 DIAGNOSIS — K922 Gastrointestinal hemorrhage, unspecified: Secondary | ICD-10-CM | POA: Diagnosis not present

## 2019-09-07 DIAGNOSIS — J449 Chronic obstructive pulmonary disease, unspecified: Secondary | ICD-10-CM | POA: Diagnosis not present

## 2019-09-07 DIAGNOSIS — J9601 Acute respiratory failure with hypoxia: Secondary | ICD-10-CM | POA: Diagnosis not present

## 2019-09-07 DIAGNOSIS — I252 Old myocardial infarction: Secondary | ICD-10-CM | POA: Diagnosis not present

## 2019-09-07 DIAGNOSIS — I480 Paroxysmal atrial fibrillation: Secondary | ICD-10-CM | POA: Diagnosis not present

## 2019-09-07 DIAGNOSIS — I110 Hypertensive heart disease with heart failure: Secondary | ICD-10-CM | POA: Diagnosis not present

## 2019-09-07 DIAGNOSIS — I272 Pulmonary hypertension, unspecified: Secondary | ICD-10-CM | POA: Diagnosis not present

## 2019-09-07 DIAGNOSIS — E039 Hypothyroidism, unspecified: Secondary | ICD-10-CM | POA: Diagnosis not present

## 2019-09-07 DIAGNOSIS — I2510 Atherosclerotic heart disease of native coronary artery without angina pectoris: Secondary | ICD-10-CM | POA: Diagnosis not present

## 2019-09-07 NOTE — Assessment & Plan Note (Signed)
Acute respiratory failure with hypoxia: Since the hospital discharge, she is at home, and seems to be stable, still have some oxygen, the family cannot check her O2 sats. CHF,CAD, P-AFib: Respiratory failure was due to diastolic CHF, currently on furosemide, metoprolol, potassium, aspirin, amlodipine.  Has gained 2.4 pounds in the last week but has no lower extremity edema. Recommend to continue monitoring weights and if the trend continues to contact cardiology or me. HTN: BP today 134/62, pulse 74 GI bleed: Currently holding anticoagulants, only on aspirin, still having black stools but the last hemoglobin of the hospital was a stable, has a follow-up with GI 09/09/2019. Dementia: Currently on Seroquel, no recent issues. General debility, advanced care planning: Again discussed with Stanton Kidney the importance of think about advance care planning.  I offered them my opinion which is no aggressive measures. She needs a  BMP and CBC within the next few days, if that is not done we will try to obtain them from this office.

## 2019-09-08 ENCOUNTER — Ambulatory Visit: Payer: Self-pay | Admitting: *Deleted

## 2019-09-08 ENCOUNTER — Other Ambulatory Visit: Payer: Self-pay | Admitting: *Deleted

## 2019-09-08 DIAGNOSIS — I252 Old myocardial infarction: Secondary | ICD-10-CM | POA: Diagnosis not present

## 2019-09-08 DIAGNOSIS — K922 Gastrointestinal hemorrhage, unspecified: Secondary | ICD-10-CM | POA: Diagnosis not present

## 2019-09-08 DIAGNOSIS — J9601 Acute respiratory failure with hypoxia: Secondary | ICD-10-CM | POA: Diagnosis not present

## 2019-09-08 DIAGNOSIS — I272 Pulmonary hypertension, unspecified: Secondary | ICD-10-CM | POA: Diagnosis not present

## 2019-09-08 DIAGNOSIS — E785 Hyperlipidemia, unspecified: Secondary | ICD-10-CM | POA: Diagnosis not present

## 2019-09-08 DIAGNOSIS — I5082 Biventricular heart failure: Secondary | ICD-10-CM | POA: Diagnosis not present

## 2019-09-08 DIAGNOSIS — J449 Chronic obstructive pulmonary disease, unspecified: Secondary | ICD-10-CM | POA: Diagnosis not present

## 2019-09-08 DIAGNOSIS — I110 Hypertensive heart disease with heart failure: Secondary | ICD-10-CM | POA: Diagnosis not present

## 2019-09-08 DIAGNOSIS — I5033 Acute on chronic diastolic (congestive) heart failure: Secondary | ICD-10-CM | POA: Diagnosis not present

## 2019-09-08 DIAGNOSIS — E039 Hypothyroidism, unspecified: Secondary | ICD-10-CM | POA: Diagnosis not present

## 2019-09-08 DIAGNOSIS — E1159 Type 2 diabetes mellitus with other circulatory complications: Secondary | ICD-10-CM | POA: Diagnosis not present

## 2019-09-08 DIAGNOSIS — I2510 Atherosclerotic heart disease of native coronary artery without angina pectoris: Secondary | ICD-10-CM | POA: Diagnosis not present

## 2019-09-08 DIAGNOSIS — I480 Paroxysmal atrial fibrillation: Secondary | ICD-10-CM | POA: Diagnosis not present

## 2019-09-08 NOTE — Patient Outreach (Signed)
[facility]) Care Management  09/08/2019  Rebecca Skinner 1922-09-14 [id number]   CSW attempted phone outreach to pt/family and was unsuccessful. CSW was able to leave a HIPPA compliant voice message and will attempt outreach again in 3-4 business days if no return call is received.    Eduard Clos, MSW, [place] Worker  [place] [phone]

## 2019-09-09 ENCOUNTER — Telehealth: Payer: Self-pay

## 2019-09-09 ENCOUNTER — Encounter: Payer: Self-pay | Admitting: Physician Assistant

## 2019-09-09 ENCOUNTER — Ambulatory Visit: Payer: Medicare Other | Admitting: Physician Assistant

## 2019-09-09 ENCOUNTER — Other Ambulatory Visit (INDEPENDENT_AMBULATORY_CARE_PROVIDER_SITE_OTHER): Payer: Medicare Other

## 2019-09-09 ENCOUNTER — Other Ambulatory Visit: Payer: Self-pay | Admitting: *Deleted

## 2019-09-09 VITALS — BP 140/70 | HR 78 | Temp 97.0°F | Ht 63.0 in | Wt 176.0 lb

## 2019-09-09 DIAGNOSIS — F419 Anxiety disorder, unspecified: Secondary | ICD-10-CM

## 2019-09-09 DIAGNOSIS — D500 Iron deficiency anemia secondary to blood loss (chronic): Secondary | ICD-10-CM

## 2019-09-09 DIAGNOSIS — I272 Pulmonary hypertension, unspecified: Secondary | ICD-10-CM

## 2019-09-09 DIAGNOSIS — I480 Paroxysmal atrial fibrillation: Secondary | ICD-10-CM

## 2019-09-09 DIAGNOSIS — I5033 Acute on chronic diastolic (congestive) heart failure: Secondary | ICD-10-CM

## 2019-09-09 DIAGNOSIS — Z87440 Personal history of urinary (tract) infections: Secondary | ICD-10-CM

## 2019-09-09 DIAGNOSIS — E1159 Type 2 diabetes mellitus with other circulatory complications: Secondary | ICD-10-CM

## 2019-09-09 DIAGNOSIS — J9601 Acute respiratory failure with hypoxia: Secondary | ICD-10-CM | POA: Diagnosis not present

## 2019-09-09 DIAGNOSIS — K921 Melena: Secondary | ICD-10-CM

## 2019-09-09 DIAGNOSIS — Z7982 Long term (current) use of aspirin: Secondary | ICD-10-CM

## 2019-09-09 DIAGNOSIS — E039 Hypothyroidism, unspecified: Secondary | ICD-10-CM

## 2019-09-09 DIAGNOSIS — I252 Old myocardial infarction: Secondary | ICD-10-CM | POA: Diagnosis not present

## 2019-09-09 DIAGNOSIS — Z9981 Dependence on supplemental oxygen: Secondary | ICD-10-CM

## 2019-09-09 DIAGNOSIS — K922 Gastrointestinal hemorrhage, unspecified: Secondary | ICD-10-CM

## 2019-09-09 DIAGNOSIS — I110 Hypertensive heart disease with heart failure: Secondary | ICD-10-CM | POA: Diagnosis not present

## 2019-09-09 DIAGNOSIS — J449 Chronic obstructive pulmonary disease, unspecified: Secondary | ICD-10-CM

## 2019-09-09 DIAGNOSIS — I5082 Biventricular heart failure: Secondary | ICD-10-CM

## 2019-09-09 DIAGNOSIS — Z8673 Personal history of transient ischemic attack (TIA), and cerebral infarction without residual deficits: Secondary | ICD-10-CM

## 2019-09-09 DIAGNOSIS — Z87891 Personal history of nicotine dependence: Secondary | ICD-10-CM

## 2019-09-09 DIAGNOSIS — I2510 Atherosclerotic heart disease of native coronary artery without angina pectoris: Secondary | ICD-10-CM | POA: Diagnosis not present

## 2019-09-09 DIAGNOSIS — M1990 Unspecified osteoarthritis, unspecified site: Secondary | ICD-10-CM

## 2019-09-09 DIAGNOSIS — E785 Hyperlipidemia, unspecified: Secondary | ICD-10-CM

## 2019-09-09 DIAGNOSIS — F0280 Dementia in other diseases classified elsewhere without behavioral disturbance: Secondary | ICD-10-CM

## 2019-09-09 DIAGNOSIS — Z9181 History of falling: Secondary | ICD-10-CM

## 2019-09-09 DIAGNOSIS — Z951 Presence of aortocoronary bypass graft: Secondary | ICD-10-CM

## 2019-09-09 LAB — CBC WITH DIFFERENTIAL/PLATELET
Basophils Absolute: 0.1 10*3/uL (ref 0.0–0.1)
Basophils Relative: 1.4 % (ref 0.0–3.0)
Eosinophils Absolute: 0.1 10*3/uL (ref 0.0–0.7)
Eosinophils Relative: 1.4 % (ref 0.0–5.0)
HCT: 33.2 % — ABNORMAL LOW (ref 36.0–46.0)
Hemoglobin: 10.3 g/dL — ABNORMAL LOW (ref 12.0–15.0)
Lymphocytes Relative: 13.5 % (ref 12.0–46.0)
Lymphs Abs: 1 10*3/uL (ref 0.7–4.0)
MCHC: 31.1 g/dL (ref 30.0–36.0)
MCV: 91.4 fl (ref 78.0–100.0)
Monocytes Absolute: 0.9 10*3/uL (ref 0.1–1.0)
Monocytes Relative: 12.7 % — ABNORMAL HIGH (ref 3.0–12.0)
Neutro Abs: 5.3 10*3/uL (ref 1.4–7.7)
Neutrophils Relative %: 71 % (ref 43.0–77.0)
Platelets: 315 10*3/uL (ref 150.0–400.0)
RBC: 3.63 Mil/uL — ABNORMAL LOW (ref 3.87–5.11)
RDW: 17.2 % — ABNORMAL HIGH (ref 11.5–15.5)
WBC: 7.5 10*3/uL (ref 4.0–10.5)

## 2019-09-09 NOTE — Progress Notes (Signed)
Chief Complaint: Melena  HPI:    Rebecca Skinner is a 86 year old African-American female, known remotely to Dr. Henrene Pastor from an initial consultation in the hospital in 2015, with a past medical history as listed below including dementia, CAD status post CABG, CVA, diabetes, asthma and others, who was referred to me by Colon Branch, MD for complaint of continued melena.    03/30/2014 colonoscopy with Dr. Henrene Pastor during hospitalization showed actively bleeding vascular malformation of the cecum status post successful endoscopic hemostatic therapy, incidental left-sided diverticulosis.    08/10/2019 patient seen in the hospital for GI bleed.  At that time it was discussed that she been having dark stools for the past week and also some red blood streaks on the toilet paper.  Was noted she had a history of previous GI bleed after being switched to Eliquis from Coumadin in 2015 and was placed on just aspirin then in 2018 patient had a TIA and her aspirin was switched to Eliquis again.  Hemoglobin at time of admission was 6.7 and she was FOBT positive.  At that time patient had received 1 unit of PRBCs and her hemoglobin had increased to 8.1.  Conservative measures were appreciated at that time by family.  It was noted that PCP/cardiology should hold Eliquis and consider long-term risk/benefits of resuming the anticoagulation.  There were plans to proceed with EGD if she continued to bleed but she did not and she was discharged.    08/29/2019 CBC with a hemoglobin of 9.3.  (This has remained stable over the past 2 weeks)    Today, the patient presents to clinic accompanied by her daughter who is her sole caretaker.  She does live with the patient.  Patient is unable to answer questions due to dementia.  Daughter explains that she has continued with black stools 1-2 times a day and these are sticky and tarry.  Tells me that she is also on iron therapy and her stools are always black but "this is not just the iron".  There  has been no change in the patient's oxygen requirements and she has not been dizzy or weak.  No complaints of GI symptoms.    Denies fever, chills or weight loss.  Past Medical History:  Diagnosis Date  . Anxiety 11/20/2011  . Asthma    UNDER THE CARE [facility] DR PAZ  . CAD (coronary artery disease)    s/p CABG s/p AO valve replacement ([place] [facility])  . CVA (cerebral infarction) 08-2013   multiple, L, d/t Afib, started eloquis  . Diabetes mellitus    type 2   . Dizziness    Chronic, admiet 07-2010,saw neuro, thought to be a peripheral issue   . Dry skin   . GERD (gastroesophageal reflux disease)   . Hyperlipidemia   . Hypertension   . Hyperthyroidism   . Keloid    @ chest  . Memory loss   . Osteoarthritis   . Osteopenia    per dexa 12/09  . Paroxysmal atrial fibrillation ([place])    cards d/c coumadin 12/2008 d/t persisten NSR and frequent falls- restarted coumadin july 2011, now on Eliquis  . Recurrent UTI   . Shortness of breath dyspnea   . TIA (transient ischemic attack)     Past Surgical History:  Procedure Laterality Date  . ABDOMINAL HYSTERECTOMY    . AORTIC VALVE REPLACEMENT  1998  . CARDIAC VALVE REPLACEMENT    . CAROTID DOPPLER  07/13/12   BILATERAL BULB/PROXIMAL  ICAS;MILD AMOUNT OF FIBROUS PLAQUE WITH NO DIAMETER REDUCTION.  . CESAREAN SECTION     x 2  . COLONOSCOPY WITH PROPOFOL N/A 03/30/2014   Procedure: COLONOSCOPY WITH PROPOFOL;  Surgeon: Irene Shipper, MD;  Location: [facility];  Service: Endoscopy;  Laterality: N/A;  . CORONARY ARTERY BYPASS GRAFT  1998   SVG TO RCA  . HEMORRHOID SURGERY    . JOINT REPLACEMENT    . MYOCARDIAL PERFUSION STUDY  12/19/09   NORMAL PATTERN OF PERFUSION IN ALL REGIONS.EF 75%.  . OOPHORECTOMY    . TOTAL KNEE ARTHROPLASTY  1999  . TRANSTHORACIC ECHOCARDIOGRAM  09/11/11   LVEF >55%.STAGE 1 DIASTOLIC DYSFUNCTION,ELEVATED LV FILLING PRESSURE.BIOPROSTHETIC AORTIC VALVE-PEAK AND MEAN GRADIENTS OF 17 MMHG AND 8 MMHG.SIGMOID SEPTUM.MILD TO  MOD MR.MILD TO MOD TR.RVSP 46 MMHG.    Current Outpatient Medications  Medication Sig Dispense Refill  . acetaminophen (TYLENOL) 500 MG tablet Take 1,000 mg by mouth every 6 (six) hours as needed (for pain).     Marland Kitchen albuterol (ACCUNEB) 0.63 MG/3ML nebulizer solution Take 3 mLs by nebulization every 6 (six) hours as needed for wheezing or shortness of breath. 75 mL 5  . amLODipine (NORVASC) 5 MG tablet Take 1 tablet (5 mg total) by mouth daily. 30 tablet 2  . aspirin EC 81 MG tablet Take 1 tablet (81 mg total) by mouth daily.    Marland Kitchen atorvastatin (LIPITOR) 40 MG tablet TAKE 1 TABLET BY MOUTH EVERYDAY AT BEDTIME 90 tablet 3  . bismuth subsalicylate (PEPTO BISMOL) 262 MG chewable tablet Chew 524 mg by mouth as needed for indigestion or diarrhea or loose stools.    . budesonide (PULMICORT) 0.5 MG/2ML nebulizer solution Take 2 mLs (0.5 mg total) by nebulization daily. 360 mL 1  . Calcium Carbonate-Vitamin D (CALCIUM 500 + D) 500-125 MG-UNIT TABS Take 1 tablet by mouth at bedtime.    Marland Kitchen esomeprazole (NEXIUM) 40 MG capsule Take 1 capsule (40 mg total) by mouth daily as needed. 90 capsule 3  . furosemide (LASIX) 40 MG tablet Take 1 tablet (40 mg total) by mouth daily. 30 tablet 2  . isosorbide mononitrate (IMDUR) 30 MG 24 hr tablet TAKE 1 TABLET BY MOUTH EVERY DAY 90 tablet 2  . magnesium oxide (MAG-OX) 400 MG tablet Take 1 tablet (400 mg total) by mouth daily. 90 tablet 3  . methimazole (TAPAZOLE) 5 MG tablet TAKE 1 TABLET BY MOUTH 3 TIMES A WEEK (Patient taking differently: Take 5 mg by mouth every Monday, Wednesday, and Friday. ) 30 tablet 5  . metoprolol tartrate (LOPRESSOR) 50 MG tablet Take 1 tablet (50 mg total) by mouth 2 (two) times daily. 180 tablet 1  . nitroGLYCERIN (NITROSTAT) 0.4 MG SL tablet Place 1 tablet (0.4 mg total) under the tongue every 5 (five) minutes x 3 doses as needed for chest pain. 25 tablet 3  . potassium chloride (KLOR-CON M10) 10 MEQ tablet Take 1 tablet (10 mEq total) by mouth  daily. 90 tablet 1  . QUEtiapine (SEROQUEL) 25 MG tablet Take 1 tablet (25 mg total) by mouth at bedtime. 30 tablet 3   No current facility-administered medications for this visit.    Allergies as of 09/09/2019 - Review Complete 09/09/2019  Allergen Reaction Noted  . Hydrocodone Itching and Nausea Only 11/16/2008  . Tramadol hcl Itching and Nausea Only 12/18/2009    Family History  Problem Relation Age of Onset  . Heart attack Father   . Coronary artery disease Son 69  deceased  . Hypertension Brother   . Stroke Brother   . Colon cancer Neg Hx   . Breast cancer Neg Hx   . Thyroid disease Neg Hx     Social History   Socioeconomic History  . Marital status: Divorced    Spouse name: Not on file  . Number of children: 4  . Years of education: Not on file  . Highest education level: Not on file  Occupational History  . Occupation: retired     Fish farm manager: RETIRED  Tobacco Use  . Smoking status: Former Research scientist (life sciences)  . Smokeless tobacco: Never Used  . Tobacco comment: quit 2004, smoked 1.5 ppd  Substance and Sexual Activity  . Alcohol use: No    Alcohol/week: 0.0 standard drinks  . Drug use: No  . Sexual activity: Not Currently  Other Topics Concern  . Not on file  Social History Narrative   Had to moved w/ [name]  ~ 01-2016   Had 3 daughter- 2 son  (lost oldest daughter and son), 2 living daughters in [place] Determinants of Health   Financial Resource Strain: Low Risk   . Difficulty of Paying Living Expenses: Not hard at all  Food Insecurity: No Food Insecurity  . Worried About Charity fundraiser in the Last Year: Never true  . Ran Out of Food in the Last Year: Never true  Transportation Needs: No Transportation Needs  . Lack of Transportation (Medical): No  . Lack of Transportation (Non-Medical): No  Physical Activity: Inactive  . Days of Exercise per Week: 0 days  . Minutes of Exercise per Session: 0 min  Stress:   . Feeling of Stress :   Social  Connections:   . Frequency of Communication with Friends and Family:   . Frequency of Social Gatherings with Friends and Family:   . Attends Religious Services:   . Active Member of Clubs or Organizations:   . Attends Archivist Meetings:   Marland Kitchen Marital Status:   Intimate Partner Violence:   . Fear of Current or Ex-Partner:   . Emotionally Abused:   Marland Kitchen Physically Abused:   . Sexually Abused:     Review of Systems:    Constitutional: No weight loss, fever or chills Cardiovascular: No chest pain Respiratory: No SOB  Gastrointestinal: See HPI and otherwise negative   Physical Exam:  Vital signs: BP 140/70 (BP Location: Left Arm, Patient Position: Sitting, Cuff Size: Normal)   Pulse 78   Temp (!) 97 F (36.1 C)   Ht 5\' 3"  (1.6 m)   Wt 176 lb (79.8 kg)   SpO2 92%   BMI 31.18 kg/m   Constitutional:   Pleasantly Demented AA female appears to be in NAD, Well developed, Well nourished, alert and cooperative Respiratory: Respirations even and unlabored. Lungs clear to auscultation bilaterally.   No wheezes, crackles, or rhonchi. On o2 via [place] Cardiovascular: Normal S1, S2. No MRG. Regular rate and rhythm. No peripheral edema, cyanosis or pallor.  Gastrointestinal:  Soft, nondistended, nontender. No rebound or guarding. Normal bowel sounds. No appreciable masses or hepatomegaly. Rectal:  Not performed.  Psychiatric: Demonstrates good judgement and reason without abnormal affect or behaviors.  RELEVANT LABS AND IMAGING: CBC    Component Value Date/Time   WBC 5.1 08/29/2019 0354   RBC 3.24 (L) 08/29/2019 0354   HGB 9.3 (L) 08/29/2019 0354   HCT 31.5 (L) 08/29/2019 0354   PLT 145 (L) 08/29/2019 0354   MCV 97.2 08/29/2019  0354   MCV 97.1 (A) 05/20/2016 1631   MCH 28.7 08/29/2019 0354   MCHC 29.5 (L) 08/29/2019 0354   RDW 15.4 08/29/2019 0354   LYMPHSABS 0.5 (L) 08/24/2019 0955   MONOABS 0.9 08/24/2019 0955   EOSABS 0.1 08/24/2019 0955   BASOSABS 0.0 08/24/2019 0955     CMP     Component Value Date/Time   NA 140 08/30/2019 0412   NA 143 12/29/2017 1045   K 4.0 08/30/2019 0412   CL 94 (L) 08/30/2019 0412   CO2 38 (H) 08/30/2019 0412   GLUCOSE 115 (H) 08/30/2019 0412   BUN 14 08/30/2019 0412   BUN 16 12/29/2017 1045   CREATININE 1.09 (H) 08/30/2019 0412   CREATININE 0.98 08/12/2013 0840   CALCIUM 8.2 (L) 08/30/2019 0412   PROT 6.5 08/24/2019 0955   ALBUMIN 3.3 (L) 08/24/2019 0955   AST 20 08/24/2019 0955   ALT 16 08/24/2019 0955   ALKPHOS 78 08/24/2019 0955   BILITOT 0.6 08/24/2019 0955   GFRNONAA 43 (L) 08/30/2019 0412   GFRAA 50 (L) 08/30/2019 0412    Assessment: 1.  Anemia: Likely from GI bleed 2.  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  Again, due to patient's dementia and multiple medical problems she is high risk for procedures and anesthesia.  Today plans are to recheck her CBC.  If hemoglobin has fallen or continues to drop then she will require an EGD for further evaluation.  Discussed this with her daughter today. 2.  If hemoglobin remains stable then suggest that we continue to check this occasionally maybe once every couple of weeks and only pursue endoscopic evaluation if necessary. 3.  Continue iron supplementation 4.  Patient to follow in clinic per recommendations after CBC.  Ellouise Newer, PA-C [facility] 09/09/2019, 11:36 AM  Cc: Colon Branch, MD

## 2019-09-09 NOTE — Patient Instructions (Signed)
If you are age 84 or older, your body mass index should be between 23-30. Your Body mass index is 31.18 kg/m. If this is out of the aforementioned range listed, please consider follow up with your Primary Care Provider.  If you are age 53 or younger, your body mass index should be between 19-25. Your Body mass index is 31.18 kg/m. If this is out of the aformentioned range listed, please consider follow up with your Primary Care Provider.   Your provider has requested that you go to the basement level for lab work before leaving today. Press "B" on the elevator. The lab is located at the first door on the left as you exit the elevator.

## 2019-09-09 NOTE — Telephone Encounter (Signed)
Plan of care signed and faxed back to [facility] at [phone]. Form sent for scanning.

## 2019-09-09 NOTE — Progress Notes (Signed)
Assessment noted.  For clinically relevant bleeding, she would need to be admitted to the hospital for assessment and possible GI evaluation.  Hopefully she can be managed conservatively.

## 2019-09-10 DIAGNOSIS — I5033 Acute on chronic diastolic (congestive) heart failure: Secondary | ICD-10-CM | POA: Diagnosis not present

## 2019-09-10 DIAGNOSIS — J449 Chronic obstructive pulmonary disease, unspecified: Secondary | ICD-10-CM | POA: Diagnosis not present

## 2019-09-10 DIAGNOSIS — E785 Hyperlipidemia, unspecified: Secondary | ICD-10-CM | POA: Diagnosis not present

## 2019-09-10 DIAGNOSIS — J9601 Acute respiratory failure with hypoxia: Secondary | ICD-10-CM | POA: Diagnosis not present

## 2019-09-10 DIAGNOSIS — I5082 Biventricular heart failure: Secondary | ICD-10-CM | POA: Diagnosis not present

## 2019-09-10 DIAGNOSIS — I252 Old myocardial infarction: Secondary | ICD-10-CM | POA: Diagnosis not present

## 2019-09-10 DIAGNOSIS — I110 Hypertensive heart disease with heart failure: Secondary | ICD-10-CM | POA: Diagnosis not present

## 2019-09-10 DIAGNOSIS — I2510 Atherosclerotic heart disease of native coronary artery without angina pectoris: Secondary | ICD-10-CM | POA: Diagnosis not present

## 2019-09-10 DIAGNOSIS — K922 Gastrointestinal hemorrhage, unspecified: Secondary | ICD-10-CM | POA: Diagnosis not present

## 2019-09-10 DIAGNOSIS — E1159 Type 2 diabetes mellitus with other circulatory complications: Secondary | ICD-10-CM | POA: Diagnosis not present

## 2019-09-10 DIAGNOSIS — I272 Pulmonary hypertension, unspecified: Secondary | ICD-10-CM | POA: Diagnosis not present

## 2019-09-10 DIAGNOSIS — I480 Paroxysmal atrial fibrillation: Secondary | ICD-10-CM | POA: Diagnosis not present

## 2019-09-10 DIAGNOSIS — E039 Hypothyroidism, unspecified: Secondary | ICD-10-CM | POA: Diagnosis not present

## 2019-09-13 ENCOUNTER — Other Ambulatory Visit: Payer: Self-pay

## 2019-09-13 ENCOUNTER — Telehealth: Payer: Self-pay | Admitting: Internal Medicine

## 2019-09-13 ENCOUNTER — Emergency Department (HOSPITAL_COMMUNITY): Payer: Medicare Other

## 2019-09-13 ENCOUNTER — Inpatient Hospital Stay (HOSPITAL_COMMUNITY)
Admission: EM | Admit: 2019-09-13 | Discharge: 2019-09-17 | DRG: 292 | Disposition: A | Discharge status: Home Health | Payer: Medicare Other | Attending: Family Medicine | Admitting: Family Medicine

## 2019-09-13 ENCOUNTER — Encounter (HOSPITAL_COMMUNITY): Payer: Self-pay | Admitting: Emergency Medicine

## 2019-09-13 DIAGNOSIS — E785 Hyperlipidemia, unspecified: Secondary | ICD-10-CM

## 2019-09-13 DIAGNOSIS — H9190 Unspecified hearing loss, unspecified ear: Secondary | ICD-10-CM | POA: Diagnosis present

## 2019-09-13 DIAGNOSIS — Z87448 Personal history of other diseases of urinary system: Secondary | ICD-10-CM

## 2019-09-13 DIAGNOSIS — J449 Chronic obstructive pulmonary disease, unspecified: Secondary | ICD-10-CM | POA: Diagnosis not present

## 2019-09-13 DIAGNOSIS — Z953 Presence of xenogenic heart valve: Secondary | ICD-10-CM | POA: Diagnosis not present

## 2019-09-13 DIAGNOSIS — I480 Paroxysmal atrial fibrillation: Secondary | ICD-10-CM | POA: Diagnosis not present

## 2019-09-13 DIAGNOSIS — Z888 Allergy status to other drugs, medicaments and biological substances status: Secondary | ICD-10-CM

## 2019-09-13 DIAGNOSIS — Z87891 Personal history of nicotine dependence: Secondary | ICD-10-CM

## 2019-09-13 DIAGNOSIS — E873 Alkalosis: Secondary | ICD-10-CM | POA: Diagnosis present

## 2019-09-13 DIAGNOSIS — J45909 Unspecified asthma, uncomplicated: Secondary | ICD-10-CM | POA: Diagnosis not present

## 2019-09-13 DIAGNOSIS — Z8719 Personal history of other diseases of the digestive system: Secondary | ICD-10-CM

## 2019-09-13 DIAGNOSIS — E039 Hypothyroidism, unspecified: Secondary | ICD-10-CM | POA: Diagnosis not present

## 2019-09-13 DIAGNOSIS — I110 Hypertensive heart disease with heart failure: Principal | ICD-10-CM | POA: Diagnosis present

## 2019-09-13 DIAGNOSIS — E0590 Thyrotoxicosis, unspecified without thyrotoxic crisis or storm: Secondary | ICD-10-CM

## 2019-09-13 DIAGNOSIS — Z20822 Contact with and (suspected) exposure to covid-19: Secondary | ICD-10-CM | POA: Diagnosis not present

## 2019-09-13 DIAGNOSIS — Z885 Allergy status to narcotic agent status: Secondary | ICD-10-CM

## 2019-09-13 DIAGNOSIS — Z7951 Long term (current) use of inhaled steroids: Secondary | ICD-10-CM

## 2019-09-13 DIAGNOSIS — E119 Type 2 diabetes mellitus without complications: Secondary | ICD-10-CM | POA: Diagnosis present

## 2019-09-13 DIAGNOSIS — R1310 Dysphagia, unspecified: Secondary | ICD-10-CM | POA: Diagnosis not present

## 2019-09-13 DIAGNOSIS — D539 Nutritional anemia, unspecified: Secondary | ICD-10-CM | POA: Diagnosis present

## 2019-09-13 DIAGNOSIS — Z7901 Long term (current) use of anticoagulants: Secondary | ICD-10-CM

## 2019-09-13 DIAGNOSIS — F03918 Unspecified dementia, unspecified severity, with other behavioral disturbance: Secondary | ICD-10-CM | POA: Diagnosis present

## 2019-09-13 DIAGNOSIS — E038 Other specified hypothyroidism: Secondary | ICD-10-CM | POA: Diagnosis present

## 2019-09-13 DIAGNOSIS — E669 Obesity, unspecified: Secondary | ICD-10-CM | POA: Diagnosis present

## 2019-09-13 DIAGNOSIS — R0902 Hypoxemia: Secondary | ICD-10-CM | POA: Diagnosis present

## 2019-09-13 DIAGNOSIS — K922 Gastrointestinal hemorrhage, unspecified: Secondary | ICD-10-CM

## 2019-09-13 DIAGNOSIS — Z823 Family history of stroke: Secondary | ICD-10-CM

## 2019-09-13 DIAGNOSIS — Z8673 Personal history of transient ischemic attack (TIA), and cerebral infarction without residual deficits: Secondary | ICD-10-CM

## 2019-09-13 DIAGNOSIS — R0602 Shortness of breath: Secondary | ICD-10-CM

## 2019-09-13 DIAGNOSIS — I482 Chronic atrial fibrillation, unspecified: Secondary | ICD-10-CM | POA: Diagnosis not present

## 2019-09-13 DIAGNOSIS — I272 Pulmonary hypertension, unspecified: Secondary | ICD-10-CM | POA: Diagnosis not present

## 2019-09-13 DIAGNOSIS — I509 Heart failure, unspecified: Secondary | ICD-10-CM

## 2019-09-13 DIAGNOSIS — J9601 Acute respiratory failure with hypoxia: Secondary | ICD-10-CM | POA: Diagnosis not present

## 2019-09-13 DIAGNOSIS — Z90710 Acquired absence of both cervix and uterus: Secondary | ICD-10-CM

## 2019-09-13 DIAGNOSIS — E876 Hypokalemia: Secondary | ICD-10-CM | POA: Diagnosis not present

## 2019-09-13 DIAGNOSIS — I5082 Biventricular heart failure: Secondary | ICD-10-CM | POA: Diagnosis not present

## 2019-09-13 DIAGNOSIS — M8580 Other specified disorders of bone density and structure, unspecified site: Secondary | ICD-10-CM | POA: Diagnosis present

## 2019-09-13 DIAGNOSIS — Z87440 Personal history of urinary (tract) infections: Secondary | ICD-10-CM

## 2019-09-13 DIAGNOSIS — I495 Sick sinus syndrome: Secondary | ICD-10-CM | POA: Diagnosis present

## 2019-09-13 DIAGNOSIS — I252 Old myocardial infarction: Secondary | ICD-10-CM | POA: Diagnosis not present

## 2019-09-13 DIAGNOSIS — F0390 Unspecified dementia without behavioral disturbance: Secondary | ICD-10-CM | POA: Diagnosis present

## 2019-09-13 DIAGNOSIS — E0500 Thyrotoxicosis with diffuse goiter without thyrotoxic crisis or storm: Secondary | ICD-10-CM | POA: Diagnosis present

## 2019-09-13 DIAGNOSIS — M436 Torticollis: Secondary | ICD-10-CM | POA: Diagnosis not present

## 2019-09-13 DIAGNOSIS — I071 Rheumatic tricuspid insufficiency: Secondary | ICD-10-CM | POA: Diagnosis not present

## 2019-09-13 DIAGNOSIS — E782 Mixed hyperlipidemia: Secondary | ICD-10-CM | POA: Diagnosis present

## 2019-09-13 DIAGNOSIS — E1159 Type 2 diabetes mellitus with other circulatory complications: Secondary | ICD-10-CM | POA: Diagnosis not present

## 2019-09-13 DIAGNOSIS — Z951 Presence of aortocoronary bypass graft: Secondary | ICD-10-CM

## 2019-09-13 DIAGNOSIS — I25810 Atherosclerosis of coronary artery bypass graft(s) without angina pectoris: Secondary | ICD-10-CM

## 2019-09-13 DIAGNOSIS — I2510 Atherosclerotic heart disease of native coronary artery without angina pectoris: Secondary | ICD-10-CM | POA: Diagnosis not present

## 2019-09-13 DIAGNOSIS — J45901 Unspecified asthma with (acute) exacerbation: Secondary | ICD-10-CM

## 2019-09-13 DIAGNOSIS — Z66 Do not resuscitate: Secondary | ICD-10-CM | POA: Diagnosis not present

## 2019-09-13 DIAGNOSIS — K219 Gastro-esophageal reflux disease without esophagitis: Secondary | ICD-10-CM | POA: Diagnosis not present

## 2019-09-13 DIAGNOSIS — Z96659 Presence of unspecified artificial knee joint: Secondary | ICD-10-CM | POA: Diagnosis present

## 2019-09-13 DIAGNOSIS — F0391 Unspecified dementia with behavioral disturbance: Secondary | ICD-10-CM | POA: Diagnosis not present

## 2019-09-13 DIAGNOSIS — I5033 Acute on chronic diastolic (congestive) heart failure: Secondary | ICD-10-CM | POA: Diagnosis not present

## 2019-09-13 DIAGNOSIS — R0600 Dyspnea, unspecified: Secondary | ICD-10-CM | POA: Diagnosis not present

## 2019-09-13 DIAGNOSIS — Z6832 Body mass index (BMI) 32.0-32.9, adult: Secondary | ICD-10-CM

## 2019-09-13 DIAGNOSIS — I4821 Permanent atrial fibrillation: Secondary | ICD-10-CM | POA: Diagnosis not present

## 2019-09-13 DIAGNOSIS — I4891 Unspecified atrial fibrillation: Secondary | ICD-10-CM | POA: Diagnosis present

## 2019-09-13 DIAGNOSIS — R609 Edema, unspecified: Secondary | ICD-10-CM | POA: Diagnosis present

## 2019-09-13 DIAGNOSIS — Z79899 Other long term (current) drug therapy: Secondary | ICD-10-CM

## 2019-09-13 DIAGNOSIS — F419 Anxiety disorder, unspecified: Secondary | ICD-10-CM | POA: Diagnosis present

## 2019-09-13 DIAGNOSIS — Z7982 Long term (current) use of aspirin: Secondary | ICD-10-CM

## 2019-09-13 DIAGNOSIS — Z8249 Family history of ischemic heart disease and other diseases of the circulatory system: Secondary | ICD-10-CM

## 2019-09-13 DIAGNOSIS — D72829 Elevated white blood cell count, unspecified: Secondary | ICD-10-CM | POA: Diagnosis present

## 2019-09-13 DIAGNOSIS — I10 Essential (primary) hypertension: Secondary | ICD-10-CM | POA: Diagnosis not present

## 2019-09-13 LAB — BASIC METABOLIC PANEL
Anion gap: 12 (ref 5–15)
BUN: 13 mg/dL (ref 8–23)
CO2: 29 mmol/L (ref 22–32)
Calcium: 8.7 mg/dL — ABNORMAL LOW (ref 8.9–10.3)
Chloride: 99 mmol/L (ref 98–111)
Creatinine, Ser: 0.99 mg/dL (ref 0.44–1.00)
GFR calc Af Amer: 57 mL/min — ABNORMAL LOW (ref >60)
GFR calc non Af Amer: 49 mL/min — ABNORMAL LOW (ref >60)
Glucose, Bld: 129 mg/dL — ABNORMAL HIGH (ref 70–99)
Potassium: 4.1 mmol/L (ref 3.5–5.1)
Sodium: 140 mmol/L (ref 135–145)

## 2019-09-13 LAB — CBC
HCT: 35.1 % — ABNORMAL LOW (ref 36.0–46.0)
Hemoglobin: 9.9 g/dL — ABNORMAL LOW (ref 12.0–15.0)
MCH: 28.4 pg (ref 26.0–34.0)
MCHC: 28.2 g/dL — ABNORMAL LOW (ref 30.0–36.0)
MCV: 100.6 fL — ABNORMAL HIGH (ref 80.0–100.0)
Platelets: 276 10*3/uL (ref 150–400)
RBC: 3.49 MIL/uL — ABNORMAL LOW (ref 3.87–5.11)
RDW: 17.3 % — ABNORMAL HIGH (ref 11.5–15.5)
WBC: 8.6 10*3/uL (ref 4.0–10.5)
nRBC: 0 % (ref 0.0–0.2)

## 2019-09-13 LAB — BRAIN NATRIURETIC PEPTIDE: B Natriuretic Peptide: 686.2 pg/mL — ABNORMAL HIGH (ref 0.0–100.0)

## 2019-09-13 LAB — CBG MONITORING, ED: Glucose-Capillary: 156 mg/dL — ABNORMAL HIGH (ref 70–99)

## 2019-09-13 MED ORDER — INSULIN ASPART 100 UNIT/ML ~~LOC~~ SOLN
0.0000 [IU] | SUBCUTANEOUS | Status: DC
  Administered 2019-09-13: 2 [IU] via SUBCUTANEOUS

## 2019-09-13 MED ORDER — FUROSEMIDE 10 MG/ML IJ SOLN
40.0000 mg | Freq: Once | INTRAMUSCULAR | Status: AC
  Administered 2019-09-13: 40 mg via INTRAVENOUS
  Filled 2019-09-13: qty 4

## 2019-09-13 MED ORDER — ACETAMINOPHEN 650 MG RE SUPP: 650.0000 mg | Freq: Four times a day (QID) | RECTAL | Status: DC | PRN

## 2019-09-13 MED ORDER — QUETIAPINE FUMARATE 25 MG PO TABS
25.0000 mg | ORAL_TABLET | Freq: Every day | ORAL | Status: DC
  Administered 2019-09-13: 25 mg via ORAL
  Filled 2019-09-13: qty 1

## 2019-09-13 MED ORDER — ATORVASTATIN CALCIUM 40 MG PO TABS
40.0000 mg | ORAL_TABLET | Freq: Every day | ORAL | Status: DC
  Administered 2019-09-15 – 2019-09-16 (×2): 40 mg via ORAL
  Filled 2019-09-13 (×3): qty 1

## 2019-09-13 MED ORDER — FUROSEMIDE 10 MG/ML IJ SOLN
40.0000 mg | Freq: Every day | INTRAMUSCULAR | Status: DC
  Administered 2019-09-14: 40 mg via INTRAVENOUS
  Filled 2019-09-13: qty 4

## 2019-09-13 MED ORDER — ASPIRIN EC 81 MG PO TBEC
81.0000 mg | DELAYED_RELEASE_TABLET | Freq: Every day | ORAL | Status: DC
  Administered 2019-09-15 – 2019-09-17 (×3): 81 mg via ORAL
  Filled 2019-09-13 (×4): qty 1

## 2019-09-13 MED ORDER — POTASSIUM CHLORIDE CRYS ER 10 MEQ PO TBCR
10.0000 meq | EXTENDED_RELEASE_TABLET | Freq: Every day | ORAL | Status: DC
  Administered 2019-09-15: 10 meq via ORAL
  Filled 2019-09-13 (×2): qty 1

## 2019-09-13 MED ORDER — SODIUM CHLORIDE 0.9% FLUSH
3.0000 mL | Freq: Two times a day (BID) | INTRAVENOUS | Status: DC
  Administered 2019-09-14 – 2019-09-17 (×6): 3 mL via INTRAVENOUS

## 2019-09-13 MED ORDER — BUDESONIDE 0.5 MG/2ML IN SUSP
0.5000 mg | Freq: Every day | RESPIRATORY_TRACT | Status: DC
  Administered 2019-09-14 – 2019-09-17 (×4): 0.5 mg via RESPIRATORY_TRACT
  Filled 2019-09-13 (×5): qty 2

## 2019-09-13 MED ORDER — SODIUM CHLORIDE 0.9% FLUSH: 3.0000 mL | INTRAVENOUS | Status: DC | PRN

## 2019-09-13 MED ORDER — ONDANSETRON HCL 4 MG PO TABS: 4.0000 mg | ORAL_TABLET | Freq: Four times a day (QID) | ORAL | Status: DC | PRN

## 2019-09-13 MED ORDER — ALBUTEROL SULFATE (2.5 MG/3ML) 0.083% IN NEBU: 2.5000 mg | INHALATION_SOLUTION | Freq: Four times a day (QID) | RESPIRATORY_TRACT | Status: DC | PRN

## 2019-09-13 MED ORDER — PANTOPRAZOLE SODIUM 40 MG PO TBEC
40.0000 mg | DELAYED_RELEASE_TABLET | Freq: Every day | ORAL | Status: DC
  Administered 2019-09-15 – 2019-09-17 (×3): 40 mg via ORAL
  Filled 2019-09-13 (×4): qty 1

## 2019-09-13 MED ORDER — METOPROLOL TARTRATE 50 MG PO TABS
50.0000 mg | ORAL_TABLET | Freq: Two times a day (BID) | ORAL | Status: DC
  Administered 2019-09-13 – 2019-09-15 (×4): 50 mg via ORAL
  Filled 2019-09-13: qty 1
  Filled 2019-09-13: qty 2
  Filled 2019-09-13 (×2): qty 1
  Filled 2019-09-13: qty 2
  Filled 2019-09-13: qty 1

## 2019-09-13 MED ORDER — METHIMAZOLE 5 MG PO TABS
5.0000 mg | ORAL_TABLET | ORAL | Status: DC
  Administered 2019-09-15 – 2019-09-17 (×2): 5 mg via ORAL
  Filled 2019-09-13 (×2): qty 1

## 2019-09-13 MED ORDER — SODIUM CHLORIDE 0.9 % IV SOLN: 250.0000 mL | INTRAVENOUS | Status: DC | PRN

## 2019-09-13 MED ORDER — ONDANSETRON HCL 4 MG/2ML IJ SOLN: 4.0000 mg | Freq: Four times a day (QID) | INTRAMUSCULAR | Status: DC | PRN

## 2019-09-13 MED ORDER — ISOSORBIDE MONONITRATE ER 30 MG PO TB24
30.0000 mg | ORAL_TABLET | Freq: Every day | ORAL | Status: DC
  Administered 2019-09-15 – 2019-09-17 (×3): 30 mg via ORAL
  Filled 2019-09-13 (×4): qty 1

## 2019-09-13 MED ORDER — ACETAMINOPHEN 325 MG PO TABS
650.0000 mg | ORAL_TABLET | Freq: Four times a day (QID) | ORAL | Status: DC | PRN
  Administered 2019-09-15 – 2019-09-16 (×2): 650 mg via ORAL
  Filled 2019-09-13 (×2): qty 2

## 2019-09-13 NOTE — ED Triage Notes (Signed)
Pt arrives with daughter who reports lower ext swelling for 3 days and a 6 lb weight gain over the weekend. Pt SOB and wheezing in triage. Daughter gave pt extra lasix today.

## 2019-09-13 NOTE — ED Notes (Signed)
Pt confused and had removed purwick. Urinated bed. Pt. Cleaned. Full linen changed. New purwick placed with brief to secure.   D/t pt's confusion RN also placed yellow socks and fall risk bracelet on pt. Also coban IV.

## 2019-09-13 NOTE — Telephone Encounter (Signed)
Spoke w/ Beth- instructed to have Pt go to ED. She will make Pt's daughter [name] aware.

## 2019-09-13 NOTE — Telephone Encounter (Signed)
Agree, thank you

## 2019-09-13 NOTE — ED Notes (Addendum)
Pt's spo2 decreased to 88% on RA. Placed on 2 L supplemental O2. Stats increased to 94%.   Pt states no home oxygen. Dr. Dallie Piles informed

## 2019-09-13 NOTE — H&P (Signed)
Rebecca Skinner [id number] DOB: Aug 20, 1922 DOA: 09/13/2019     PCP: Colon Branch, MD   Outpatient Specialists:   CARDS:  Dr. Eulas Post PA/Dr. Claiborne Billings   NEurology     Dr.   [name]    GI Dr. Henrene Pastor  (  [facility])    Patient arrived to ER on 09/13/19 at 1536  Patient coming from: home Lives   With family    Chief Complaint:   Chief Complaint  Patient presents with  . Shortness of Breath    HPI: Rebecca Skinner is a 86 y.o. female with medical history significant of multiple CVA, PAF on Eliquis,chronic diastolic CHF,CAD s/pCABGx1 SVG to RCAand aortic valve replacement1998,HTN, HLD,DM 2,GERD GERD,andhypothyroidism, sick sinus syndrome.    Dementia    Presented with worsening shortness of breath and leg edema 20 of Lasix was given additionally and and patient was instructed to go to emergency department Daughter states patient's leg has been swelling for the past 3 days she gained 6 pounds over the weekend She is significantly short of breath and wheezing.  Patient has underlying dementia and unable to provide detailed history at her baseline she does not always recognize people and usually talks about events that happened 20 years ago.  She is able to have a minimal conversation her baseline walks with a push walker.  stroke in 2015 at that time her aspirin Plavix was discontinued and she was started on Eliquis but developed significant anemia and rectal bleeding in October 2015 requiring 2 units of blood transfusion.  Colonoscopy at that time showed AVMs Initially she was switched to Aspirin only but In 2018 she had a recurrent TIA with aphasia after which she was switched back to Eliquis and aspirin was discontinued  Was recently admitted for symptomatic anemia on 08/09/2019 2 08/09/2020 at that time she was transfused 1 unit PRBC and was discharged home to follow-up with GI. At that time Plavix was stopped. Patient has been still having black stools. She was supposed to follow-up with GI  But  end up admitted with CHF exacerbation  Discharged on March 22 and on 1 April was able to be seen by GI Reported still has persistent stools 1-2 times a day which are black Plan was to repeat her hemoglobin and if down she would need EGD otherwise we will hold off   Infectious risk factors:  Reports shortness of breath,      Has   been vaccinated against COVID    in house  PCR testing  Pending  Lab Results  Component Value Date   [place] 08/24/2019   [place] 08/09/2019   [place] NEGATIVE 05/22/2019    Regarding pertinent Chronic problems:    Hyperlipidemia -  on statins Lipitor   HTN on Norvasc benazepril Imdur Lopressor   chronic CHF diastolic  - last [id number] EF 60 to 65%, mild LVH, mild to moderate LAE, moderate RAE, moderate to severe TR, normal aortic valve prosthetic function.  On daily Lasix with additional 20 mg as needed    CAD  - On   Statin,                 -  followed by cardiology  sp CABG  DM 2 -  Recent Labs[] Expand by Default       Lab Results  Component Value Date   HGBA1C 7.2 (H) 04/08/2019     diet controlled  History of hyperthyroidism on Tapazole  Asthma -well  controlled on home inhalers    Hx of CVA -  with/out residual deficits on Eliquis   A. Fib -  - CHA2DS2 vas score 6 :  current not  on anticoagulation due to recurrent GI bleed           -  Rate control:  Currently controlled with Metoprolol,    While in ER: Noted to have BNP elevated 682 EKG nonischemic showing atrial fibrillation Chest x-ray consistent with cardiomegaly and bilateral pleural effusions Noted to have leukocytosis stable hemoglobin at 9.9 Patient noted to be hypoxic on room air down to 88% start on 2 L improved to 94    Hospitalist was called for admission for CHF exacerbation   The following Work up has been ordered so far:  Orders Placed This Encounter  Procedures  . DG Chest 2 View  . CBC  . Basic  metabolic panel  . Brain natriuretic peptide  . If O2 Sat <94% administer O2 at 2 liters/minute via nasal cannula  . Consult for [place] Admission  ALL PATIENTS BEING ADMITTED/HAVING PROCEDURES NEED COVID-19 SCREENING  . Pulse oximetry, continuous  . ED EKG  . EKG 12-Lead    Following Medications were ordered in ER: Medications  furosemide (LASIX) injection 40 mg (40 mg Intravenous Given 09/13/19 2113)      Significant initial  Findings: Abnormal Labs Reviewed  CBC - Abnormal; Notable for the following components:      Result Value   RBC 3.49 (*)    Hemoglobin 9.9 (*)    HCT 35.1 (*)    MCV 100.6 (*)    MCHC 28.2 (*)    RDW 17.3 (*)    All other components within normal limits  BASIC METABOLIC PANEL - Abnormal; Notable for the following components:   Glucose, Bld 129 (*)    Calcium 8.7 (*)    GFR calc non Af Amer 49 (*)    GFR calc Af Amer 57 (*)    All other components within normal limits  BRAIN NATRIURETIC PEPTIDE - Abnormal; Notable for the following components:   B Natriuretic Peptide 686.2 (*)    All other components within normal limits    Otherwise labs showing:    Recent Labs  Lab 09/13/19 1608  NA 140  K 4.1  CO2 29  GLUCOSE 129*  BUN 13  CREATININE 0.99  CALCIUM 8.7*    Cr  Stable  Lab Results  Component Value Date   CREATININE 0.99 09/13/2019   CREATININE 1.09 (H) 08/30/2019   CREATININE 1.09 (H) 08/29/2019    No results for input(s): AST, ALT, ALKPHOS, BILITOT, PROT, ALBUMIN in the last 168 hours. Lab Results  Component Value Date   CALCIUM 8.7 (L) 09/13/2019   PHOS 3.7 09/13/2015      WBC      Component Value Date/Time   WBC 8.6 09/13/2019 1608   ANC    Component Value Date/Time   NEUTROABS 5.3 09/09/2019 1156   ALC No components found for: LYMPHAB    Plt: Lab Results  Component Value Date   PLT 276 09/13/2019    Lactic Acid, Venous    Component Value Date/Time   LATICACIDVEN 1.5 08/24/2019 1153     COVID-19  Labs  No results for input(s): DDIMER, FERRITIN, LDH, CRP in the last 72 hours.  Lab Results  Component Value Date   SARSCOV2NAA NEGATIVE 08/24/2019   [place] NEGATIVE 08/09/2019   [place] NEGATIVE 05/22/2019  HG/HCT   stable,       Component Value Date/Time   HGB 9.9 (L) 09/13/2019 1608   HCT 35.1 (L) 09/13/2019 1608     Troponin  14     ECG: Ordered Personally reviewed by me showing: HR : 72 Rhythm:  A.fib.    no evidence of ischemic changes QTC 464   BNP (last 3 results) Recent Labs    05/22/19 2200 08/24/19 0955 09/13/19 1608  BNP 698.3* 713.6* 686.2*    ProBNP (last 3 results) No results for input(s): PROBNP in the last 8760 hours.  DM  labs:  HbA1C: Recent Labs    04/08/19 1526 05/23/19 0514 08/10/19 0131  HGBA1C 7.2* 7.2* 5.9*      UA not ordered        Ordered     CXR -cardiomegaly and CHF     ED Triage Vitals  Enc Vitals Group     BP 09/13/19 1541 100/82     Pulse Rate 09/13/19 1541 69     Resp 09/13/19 1653 18     Temp 09/13/19 1541 98.2 F (36.8 C)     Temp Source 09/13/19 1541 Oral     SpO2 09/13/19 1541 90 %     Weight --      Height --      Head Circumference --      Peak Flow --      Pain Score 09/13/19 1548 0     Pain Loc --      Pain Edu? --      Excl. in [place]? --   TMAX(24)@       Latest  Blood pressure (!) 155/80, pulse 87, temperature 98.3 F (36.8 C), temperature source Oral, resp. rate 20, SpO2 94 %.     Review of Systems:    Pertinent positives include  shortness of breath at rest.  dyspnea on exertion,  Bilateral lower extremity swelling Constitutional:  No weight loss, night sweats, Fevers, chills, fatigue, weight loss  HEENT:  No headaches, Difficulty swallowing,Tooth/dental problems,Sore throat,  No sneezing, itching, ear ache, nasal congestion, post nasal drip,  Cardio-vascular:  No chest pain, Orthopnea, PND, anasarca, dizziness, palpitations.no  GI:  No heartburn, indigestion,  abdominal pain, nausea, vomiting, diarrhea, change in bowel habits, loss of appetite, melena, blood in stool, hematemesis Resp:  no  No excess mucus, no productive cough, No non-productive cough, No coughing up of blood.No change in color of mucus.No wheezing. Skin:  no rash or lesions. No jaundice GU:  no dysuria, change in color of urine, no urgency or frequency. No straining to urinate.  No flank pain.  Musculoskeletal:  No joint pain or no joint swelling. No decreased range of motion. No back pain.  Psych:  No change in mood or affect. No depression or anxiety. No memory loss.  Neuro: no localizing neurological complaints, no tingling, no weakness, no double vision, no gait abnormality, no slurred speech, no confusion  All systems reviewed and apart from [place] all are negative  Past Medical History:   Past Medical History:  Diagnosis Date  . Anxiety 11/20/2011  . Asthma    UNDER THE CARE [facility] DR PAZ  . CAD (coronary artery disease)    s/p CABG s/p AO valve replacement ([place] [facility])  . CVA (cerebral infarction) 08-2013   multiple, L, d/t Afib, started eloquis  . Diabetes mellitus    type 2   . Dizziness    Chronic, admiet 07-2010,saw neuro, thought  to be a peripheral issue   . Dry skin   . GERD (gastroesophageal reflux disease)   . Hyperlipidemia   . Hypertension   . Hyperthyroidism   . Keloid    @ chest  . Memory loss   . Osteoarthritis   . Osteopenia    per dexa 12/09  . Paroxysmal atrial fibrillation ([place])    cards d/c coumadin 12/2008 d/t persisten NSR and frequent falls- restarted coumadin july 2011, now on Eliquis  . Recurrent UTI   . Shortness of breath dyspnea   . TIA (transient ischemic attack)      Past Surgical History:  Procedure Laterality Date  . ABDOMINAL HYSTERECTOMY    . AORTIC VALVE REPLACEMENT  1998  . CARDIAC VALVE REPLACEMENT    . CAROTID DOPPLER  07/13/12   BILATERAL BULB/PROXIMAL ICAS;MILD AMOUNT OF FIBROUS PLAQUE WITH NO DIAMETER  REDUCTION.  . CESAREAN SECTION     x 2  . COLONOSCOPY WITH PROPOFOL N/A 03/30/2014   Procedure: COLONOSCOPY WITH PROPOFOL;  Surgeon: Irene Shipper, MD;  Location: [facility] ENDOSCOPY;  Service: Endoscopy;  Laterality: N/A;  . CORONARY ARTERY BYPASS GRAFT  1998   SVG TO RCA  . HEMORRHOID SURGERY    . JOINT REPLACEMENT    . MYOCARDIAL PERFUSION STUDY  12/19/09   NORMAL PATTERN OF PERFUSION IN ALL REGIONS.EF 75%.  . OOPHORECTOMY    . TOTAL KNEE ARTHROPLASTY  1999  . TRANSTHORACIC ECHOCARDIOGRAM  09/11/11   LVEF >55%.STAGE 1 DIASTOLIC DYSFUNCTION,ELEVATED LV FILLING PRESSURE.BIOPROSTHETIC AORTIC VALVE-PEAK AND MEAN GRADIENTS OF 17 MMHG AND 8 MMHG.SIGMOID SEPTUM.MILD TO MOD MR.MILD TO MOD TR.RVSP 46 MMHG.    Social History:     reports that she has quit smoking. She has never used smokeless tobacco. She reports that she does not drink alcohol or use drugs.   Family History:   Family History  Problem Relation Age of Onset  . Heart attack Father   . Coronary artery disease Son 67       deceased  . Hypertension Brother   . Stroke Brother   . Colon cancer Neg Hx   . Breast cancer Neg Hx   . Thyroid disease Neg Hx     Allergies: Allergies  Allergen Reactions  . Hydrocodone Itching and Nausea Only  . Tramadol Hcl Itching and Nausea Only     Prior to Admission medications   Medication Sig Start Date End Date Taking? Authorizing Provider  acetaminophen (TYLENOL) 500 MG tablet Take 1,000 mg by mouth every 6 (six) hours as needed (for pain).     [provider]  albuterol (ACCUNEB) 0.63 MG/3ML nebulizer solution Take 3 mLs by nebulization every 6 (six) hours as needed for wheezing or shortness of breath. 05/25/19   Colon Branch, MD  amLODipine (NORVASC) 5 MG tablet Take 1 tablet (5 mg total) by mouth daily. 08/31/19   Oswald Hillock, MD  aspirin EC 81 MG tablet Take 1 tablet (81 mg total) by mouth daily. 08/16/19   Colon Branch, MD  atorvastatin (LIPITOR) 40 MG tablet TAKE 1 TABLET BY MOUTH  EVERYDAY AT BEDTIME 06/18/19   Colon Branch, MD  bismuth subsalicylate (PEPTO BISMOL) 262 MG chewable tablet Chew 524 mg by mouth as needed for indigestion or diarrhea or loose stools.    [provider]  budesonide (PULMICORT) 0.5 MG/2ML nebulizer solution Take 2 mLs (0.5 mg total) by nebulization daily. 03/26/19   Colon Branch, MD  Calcium Carbonate-Vitamin D (CALCIUM  500 + D) 500-125 MG-UNIT TABS Take 1 tablet by mouth at bedtime.    [provider]  esomeprazole (NEXIUM) 40 MG capsule Take 1 capsule (40 mg total) by mouth daily as needed. 08/12/19   Colon Branch, MD  furosemide (LASIX) 40 MG tablet Take 1 tablet (40 mg total) by mouth daily. 08/31/19   Oswald Hillock, MD  isosorbide mononitrate (IMDUR) 30 MG 24 hr tablet TAKE 1 TABLET BY MOUTH EVERY DAY 12/21/18   Troy Sine, MD  magnesium oxide (MAG-OX) 400 MG tablet Take 1 tablet (400 mg total) by mouth daily. 03/15/19   Colon Branch, MD  methimazole (TAPAZOLE) 5 MG tablet TAKE 1 TABLET BY MOUTH 3 TIMES A WEEK Patient taking differently: Take 5 mg by mouth every Monday, Wednesday, and Friday.  09/23/18   Colon Branch, MD  metoprolol tartrate (LOPRESSOR) 50 MG tablet Take 1 tablet (50 mg total) by mouth 2 (two) times daily. 07/09/19   Colon Branch, MD  nitroGLYCERIN (NITROSTAT) 0.4 MG SL tablet Place 1 tablet (0.4 mg total) under the tongue every 5 (five) minutes x 3 doses as needed for chest pain. 05/25/19   Colon Branch, MD  potassium chloride (KLOR-CON M10) 10 MEQ tablet Take 1 tablet (10 mEq total) by mouth daily. 07/09/19   Colon Branch, MD  QUEtiapine (SEROQUEL) 25 MG tablet Take 1 tablet (25 mg total) by mouth at bedtime. 08/16/19   Colon Branch, MD   Physical Exam: Blood pressure (!) 155/80, pulse 87, temperature 98.3 F (36.8 C), temperature source Oral, resp. rate 20, SpO2 94 %. 1. General:  in No  Acute distress   Chronically ill  -appearing 2. Psychological: Alert and  Oriented to self only 3. Head/ENT:   Moist   Mucous  Membranes                          Head Non traumatic, neck supple                           Poor Dentition 4. SKIN: normal   Skin turgor,  Skin clean Dry and intact no rash 5. Heart: Regular rate and rhythm no  Murmur, no Rub or gallop 6. Lungs: Distant no wheezes or crackles   7. Abdomen: Soft,  non-tender, Non distended    obese  bowel sounds present 8. Lower extremities: no clubbing, cyanosis, trace edema 9. Neurologically Grossly intact, moving all 4 extremities equally  10. MSK: Normal range of motion   All other LABS:     Recent Labs  Lab 09/09/19 1156 09/13/19 1608  WBC 7.5 8.6  NEUTROABS 5.3  --   HGB 10.3* 9.9*  HCT 33.2* 35.1*  MCV 91.4 100.6*  PLT 315.0 276     Recent Labs  Lab 09/13/19 1608  NA 140  K 4.1  CL 99  CO2 29  GLUCOSE 129*  BUN 13  CREATININE 0.99  CALCIUM 8.7*     No results for input(s): AST, ALT, ALKPHOS, BILITOT, PROT, ALBUMIN in the last 168 hours.     Cultures:    Component Value Date/Time   SDES URINE, CATHETERIZED 03/05/2016 1540   SDES URINE, CLEAN CATCH 03/05/2016 1540   SPECREQUEST Normal 03/05/2016 1540   SPECREQUEST NONE 03/05/2016 1540   CULT MULTIPLE SPECIES PRESENT, SUGGEST RECOLLECTION (A) 03/05/2016 1540   REPTSTATUS 03/06/2016 FINAL 03/05/2016 1540  REPTSTATUS 03/05/2016 FINAL 03/05/2016 1540     Radiological Exams on Admission: DG Chest 2 View  Result Date: 09/13/2019 CLINICAL DATA:  86 year old female with shortness of breath. EXAM: CHEST - 2 VIEW COMPARISON:  Chest radiograph dated 08/24/2019. FINDINGS: There is cardiomegaly with mild vascular congestion and small bilateral pleural effusions relatively similar to prior radiograph. Bibasilar, left greater right atelectasis although infiltrate is not excluded. Clinical correlation is recommended. No pneumothorax. There is atherosclerotic calcification of the aorta. Median sternotomy wires and postsurgical changes of CABG and heart valve replacement. Degenerative  changes of the spine. No acute osseous pathology. IMPRESSION: Cardiomegaly with findings of CHF and small bilateral pleural effusions. Superimposed pneumonia is not excluded. Overall slight interval improvement in the aeration of the lungs since the radiograph of 08/24/2019. Electronically Signed   By: Anner Crete M.D.   On: 09/13/2019 16:22    Chart has been reviewed   Assessment/Plan  86 y.o. female with medical history significant of multiple CVA, PAF on Eliquis,chronic diastolic CHF,CAD s/pCABGx1 SVG to RCAand aortic valve replacement1998,HTN, HLD,DM 2,GERD GERD,andhypothyroidism, sick sinus syndrome.    Dementia  Admitted for CHF exacerbation   Present on Admission:   . Acute on chronic diastolic CHF (congestive heart failure) ([place]) -  - admit on telemetry,  cycle cardiac enzymes, Troponin   obtain serial ECG  to evaluate for ischemia as a cause of heart failure  monitor daily weight:  Filed Weights   09/13/19 2223  Weight: 82.8 kg   Last BNP BNP (last 3 results) Recent Labs    05/22/19 2200 08/24/19 0955 09/13/19 1608  BNP 698.3* 713.6* 686.2*      diurese with IV lasix and monitor orthostatics and creatinine to avoid over diuresis. Hold off on echogram given was done just recently  ACE/ARBi Contraindicated     . Hyperthyroidism - chronic stable cont home meds check TSH  . Diabetes mellitus with circulatory complication (HCC) -  - Order Sensitive    SSI   -  check TSH and HgA1C    . Dyslipidemia -   stable continue home medications   . HTN (hypertension) -will resume home medications  . CAD (coronary artery disease) of artery bypass graft -  - chronic, now on aspirin  given recent TIA/CVA We will continue  Statin resume beta blocker    . ATRIAL FIBRILLATION, chronic -           - CHA2DS2 vas score 6 : Eliquis on hold secondary to recurrent GI bleeding         -  Rate control:  Currently controlled  Metoprolol   . Dementia ([place]) chronic  anticipate some degree of sundowning    History of asthma continue albuterol as needed   Other plan as per orders.  DVT prophylaxis:  SCD      Code Status:   DNR/DNI   as per discussion with family in the past    Family Communication:   Family not at  Bedside    Disposition Plan:   To home once workup is complete and patient is stable   Following barriers for discharge:                                                Will need to be able to tolerate PO  Will likely need home health, home O2, set up                                                 Would benefit from PT/OT eval prior to DC  Ordered                                     Transition of care consulted                    Consults called: will email cardiology     Admission status:  ED Disposition    None        inpatient     I Expect 2 midnight stay secondary to severity of patient's current illness need for inpatient interventions justified by the following:     Severe lab/radiological/exam abnormalities including:  CHF exacerbation   and extensive comorbidities including:   DM2    CHF  CAD  CKD  dementia  history of stroke with residual deficits   That are currently affecting medical management.   I expect  patient to be hospitalized for 2 midnights requiring inpatient medical care.  Patient is at high risk for adverse outcome (such as loss of life or disability) if not treated.  Indication for inpatient stay as follows:  Severe change from baseline regarding mental status   New or worsening hypoxia  Need for IV diuretics     Level of care   tele  For 24H      Precautions: admitted as asymptomatic screening protocol   PPE: Used by the provider:   P100  eye Goggles,  Gloves     Griffith Santilli 09/13/2019, 2:26 AM    [facility]     after 2 AM please page floor coverage PA If 7AM-7PM, please contact the day team taking care of the patient  using [email or web address]   Patient was evaluated in the context of the global COVID-19 pandemic, which necessitated consideration that the patient might be at risk for infection with the SARS-CoV-2 virus that causes COVID-19. Institutional protocols and algorithms that pertain to the evaluation of patients at risk for COVID-19 are in a state of rapid change based on information released by regulatory bodies including the CDC and federal and state organizations. These policies and algorithms were followed during the patient's care.

## 2019-09-13 NOTE — ED Provider Notes (Signed)
[facility] EMERGENCY DEPARTMENT Provider Note   CSN: [id number] Arrival date & time: 09/13/19  1536     History Chief Complaint  Patient presents with  . Shortness of Breath    Rebecca Skinner is a 86 y.o. female.  HPI   86 year old female with a past medical history of CAD status post CABG and aortic valve replacement, heart failure with preserved ejection fraction with last EF of 65% on 08/25/2019, previous CVA presumed secondary to A. fib now on Eliquis, type 2 diabetes, HLD, HTN presenting to the emerge department complaining of shortness of breath associated with lower extremity swelling and 6 pound weight gain over the past several days.  Patient did not have any fevers, chills, cough, congestion, rhinorrhea, chest pain, abdominal pain, changes in bowel or bladder function.  No known sick contacts or Covid contacts.  Patient has been fully vaccinated for Covid.  Patient has increased fatigue and generalized weakness.  Past Medical History:  Diagnosis Date  . Anxiety 11/20/2011  . Asthma    UNDER [facility] DR PAZ  . CAD (coronary artery disease)    s/p CABG s/p AO valve replacement ([place] [facility])  . CVA (cerebral infarction) 08-2013   multiple, L, d/t Afib, started eloquis  . Diabetes mellitus    type 2   . Dizziness    Chronic, admiet 07-2010,saw neuro, thought to be a peripheral issue   . Dry skin   . GERD (gastroesophageal reflux disease)   . Hyperlipidemia   . Hypertension   . Hyperthyroidism   . Keloid    @ chest  . Memory loss   . Osteoarthritis   . Osteopenia    per dexa 12/09  . Paroxysmal atrial fibrillation ([place])    cards d/c coumadin 12/2008 d/t persisten NSR and frequent falls- restarted coumadin july 2011, now on Eliquis  . Recurrent UTI   . Shortness of breath dyspnea   . TIA (transient ischemic attack)     Patient Active Problem List   Diagnosis Date Noted  . Acute exacerbation of CHF (congestive heart failure) ([place]) 08/24/2019    . DNR (do not resuscitate) 08/24/2019  . Melena 08/09/2019  . Stroke-like symptom   . Hypokalemia 05/22/2019  . Chest pain 06/21/2017  . TIA (transient ischemic attack) 08/30/2016  . Acute kidney injury ([place]) 03/05/2016  . Exposure keratitis 09/23/2015  . Keratopathy, band 09/13/2015  . PCP NOTES >>>>> 04/05/2015  . Chronic diastolic heart failure ([place]) 12/29/2014  . Edema 12/20/2014  . Dyspnea 12/20/2014  . Angiectasia 03/30/2014  . GI bleed 03/28/2014  . Dementia with behavioral disturbance ([place]) 09/16/2013  . Annual physical exam 11/21/2011  . Macrocytic anemia 07/17/2010  . ATRIAL FIBRILLATION, chronic 03/05/2007  . Diabetes mellitus with circulatory complication ([place]) [id number]  . S/P AVR (aortic valve replacement) 10/15/2006  . Hyperthyroidism 09/03/2006  . Dyslipidemia 09/03/2006  . HTN (hypertension) 09/03/2006  . CAD (coronary artery disease) of artery bypass graft 09/03/2006  . Asthma, chronic 09/03/2006    Past Surgical History:  Procedure Laterality Date  . ABDOMINAL HYSTERECTOMY    . AORTIC VALVE REPLACEMENT  1998  . CARDIAC VALVE REPLACEMENT    . CAROTID DOPPLER  07/13/12   BILATERAL BULB/PROXIMAL ICAS;MILD AMOUNT OF FIBROUS PLAQUE WITH NO DIAMETER REDUCTION.  . CESAREAN SECTION     x 2  . COLONOSCOPY WITH PROPOFOL N/A 03/30/2014   Procedure: COLONOSCOPY WITH PROPOFOL;  Surgeon: Irene Shipper, MD;  Location: [facility] ENDOSCOPY;  Service: Endoscopy;  Laterality: N/A;  . CORONARY ARTERY BYPASS GRAFT  1998   SVG TO RCA  . HEMORRHOID SURGERY    . JOINT REPLACEMENT    . MYOCARDIAL PERFUSION STUDY  12/19/09   NORMAL PATTERN OF PERFUSION IN ALL REGIONS.EF 75%.  . OOPHORECTOMY    . TOTAL KNEE ARTHROPLASTY  1999  . TRANSTHORACIC ECHOCARDIOGRAM  09/11/11   LVEF >55%.STAGE 1 DIASTOLIC DYSFUNCTION,ELEVATED LV FILLING PRESSURE.BIOPROSTHETIC AORTIC VALVE-PEAK AND MEAN GRADIENTS OF 17 MMHG AND 8 MMHG.SIGMOID SEPTUM.MILD TO MOD MR.MILD TO MOD TR.RVSP 46 MMHG.     OB History     Gravida  4   Para  4   Term      Preterm      AB      Living        SAB      TAB      Ectopic      Multiple      Live Births              Family History  Problem Relation Age of Onset  . Heart attack Father   . Coronary artery disease Son 14       deceased  . Hypertension Brother   . Stroke Brother   . Colon cancer Neg Hx   . Breast cancer Neg Hx   . Thyroid disease Neg Hx     Social History   Tobacco Use  . Smoking status: Former Research scientist (life sciences)  . Smokeless tobacco: Never Used  . Tobacco comment: quit 2004, smoked 1.5 ppd  Substance Use Topics  . Alcohol use: No    Alcohol/week: 0.0 standard drinks  . Drug use: No    Home Medications Prior to Admission medications   Medication Sig Start Date End Date Taking? Authorizing Provider  acetaminophen (TYLENOL) 500 MG tablet Take 1,000 mg by mouth every 6 (six) hours as needed (for pain).     [provider]  albuterol (ACCUNEB) 0.63 MG/3ML nebulizer solution Take 3 mLs by nebulization every 6 (six) hours as needed for wheezing or shortness of breath. 05/25/19   Colon Branch, MD  amLODipine (NORVASC) 5 MG tablet Take 1 tablet (5 mg total) by mouth daily. 08/31/19   Oswald Hillock, MD  aspirin EC 81 MG tablet Take 1 tablet (81 mg total) by mouth daily. 08/16/19   Colon Branch, MD  atorvastatin (LIPITOR) 40 MG tablet TAKE 1 TABLET BY MOUTH EVERYDAY AT BEDTIME 06/18/19   Colon Branch, MD  bismuth subsalicylate (PEPTO BISMOL) 262 MG chewable tablet Chew 524 mg by mouth as needed for indigestion or diarrhea or loose stools.    [provider]  budesonide (PULMICORT) 0.5 MG/2ML nebulizer solution Take 2 mLs (0.5 mg total) by nebulization daily. 03/26/19   Colon Branch, MD  Calcium Carbonate-Vitamin D (CALCIUM 500 + D) 500-125 MG-UNIT TABS Take 1 tablet by mouth at bedtime.    [provider]  esomeprazole (NEXIUM) 40 MG capsule Take 1 capsule (40 mg total) by mouth daily as needed. 08/12/19   Colon Branch,  MD  furosemide (LASIX) 40 MG tablet Take 1 tablet (40 mg total) by mouth daily. 08/31/19   Oswald Hillock, MD  isosorbide mononitrate (IMDUR) 30 MG 24 hr tablet TAKE 1 TABLET BY MOUTH EVERY DAY 12/21/18   Troy Sine, MD  magnesium oxide (MAG-OX) 400 MG tablet Take 1 tablet (400 mg total) by mouth daily. 03/15/19   Colon Branch,  MD  methimazole (TAPAZOLE) 5 MG tablet TAKE 1 TABLET BY MOUTH 3 TIMES A WEEK Patient taking differently: Take 5 mg by mouth every Monday, Wednesday, and Friday.  09/23/18   Colon Branch, MD  metoprolol tartrate (LOPRESSOR) 50 MG tablet Take 1 tablet (50 mg total) by mouth 2 (two) times daily. 07/09/19   Colon Branch, MD  nitroGLYCERIN (NITROSTAT) 0.4 MG SL tablet Place 1 tablet (0.4 mg total) under the tongue every 5 (five) minutes x 3 doses as needed for chest pain. 05/25/19   Colon Branch, MD  potassium chloride (KLOR-CON M10) 10 MEQ tablet Take 1 tablet (10 mEq total) by mouth daily. 07/09/19   Colon Branch, MD  QUEtiapine (SEROQUEL) 25 MG tablet Take 1 tablet (25 mg total) by mouth at bedtime. 08/16/19   Colon Branch, MD    Allergies    Hydrocodone and Tramadol hcl  Review of Systems   Review of Systems  Constitutional: Positive for activity change and fatigue. Negative for appetite change, chills, diaphoresis and fever.  HENT: Negative for congestion and rhinorrhea.   Respiratory: Positive for shortness of breath. Negative for cough and wheezing.   Cardiovascular: Positive for leg swelling. Negative for chest pain.  Gastrointestinal: Negative for abdominal distention, abdominal pain, diarrhea, nausea and vomiting.  Genitourinary: Negative for decreased urine volume, difficulty urinating, dysuria, frequency and urgency.  Skin: Negative for color change and wound.  Neurological: Negative for dizziness, syncope, weakness, light-headedness and headaches.  All other systems reviewed and are negative.   Physical Exam Updated Vital Signs BP (!) 146/68   Pulse 81   Temp  98.3 F (36.8 C) (Oral)   Resp (!) 24   Ht 5\' 3"  (1.6 m)   Wt 82.8 kg   SpO2 92%   BMI 32.35 kg/m   Physical Exam Vitals and nursing note reviewed.  Constitutional:      General: She is not in acute distress.    Appearance: Normal appearance. She is normal weight. She is ill-appearing.  HENT:     Head: Normocephalic.     Right Ear: External ear normal.     Left Ear: External ear normal.     Nose: Nose normal.     Mouth/Throat:     Mouth: Mucous membranes are moist.     Pharynx: Oropharynx is clear.  Eyes:     Extraocular Movements: Extraocular movements intact.  Cardiovascular:     Rate and Rhythm: Normal rate and regular rhythm.     Pulses: Normal pulses.     Heart sounds: Normal heart sounds.  Pulmonary:     Effort: Pulmonary effort is normal. Tachypnea present. No accessory muscle usage or respiratory distress.     Breath sounds: Examination of the right-lower field reveals decreased breath sounds. Examination of the left-lower field reveals decreased breath sounds. Decreased breath sounds present. No wheezing or rhonchi.  Abdominal:     General: Bowel sounds are normal.     Palpations: Abdomen is soft.     Tenderness: There is no abdominal tenderness. There is no guarding.  Musculoskeletal:     Cervical back: Normal range of motion.     Right lower leg: Edema present.     Left lower leg: Edema present.  Skin:    General: Skin is warm and dry.     Capillary Refill: Capillary refill takes less than 2 seconds.  Neurological:     General: No focal deficit present.     Mental Status:  She is alert and oriented to person, place, and time. Mental status is at baseline.     ED Results / Procedures / Treatments   Labs (all labs ordered are listed, but only abnormal results are displayed) Labs Reviewed  CBC - Abnormal; Notable for the following components:      Result Value   RBC 3.49 (*)    Hemoglobin 9.9 (*)    HCT 35.1 (*)    MCV 100.6 (*)    MCHC 28.2 (*)     RDW 17.3 (*)    All other components within normal limits  BASIC METABOLIC PANEL - Abnormal; Notable for the following components:   Glucose, Bld 129 (*)    Calcium 8.7 (*)    GFR calc non Af Amer 49 (*)    GFR calc Af Amer 57 (*)    All other components within normal limits  BRAIN NATRIURETIC PEPTIDE - Abnormal; Notable for the following components:   B Natriuretic Peptide 686.2 (*)    All other components within normal limits  SARS CORONAVIRUS 2 (TAT 6-24 HRS)  TROPONIN I (HIGH SENSITIVITY)    EKG EKG Interpretation  Date/Time:  Monday September 13 2019 15:46:40 EDT Ventricular Rate:  72 PR Interval:    QRS Duration: 80 QT Interval:  424 QTC Calculation: 464 R Axis:   88 Text Interpretation: Atrial fibrillation Low voltage QRS Nonspecific T wave abnormality Confirmed by Lajean Saver [phone]) on 09/13/2019 8:30:38 PM   Radiology DG Chest 2 View  Result Date: 09/13/2019 CLINICAL DATA:  86 year old female with shortness of breath. EXAM: CHEST - 2 VIEW COMPARISON:  Chest radiograph dated 08/24/2019. FINDINGS: There is cardiomegaly with mild vascular congestion and small bilateral pleural effusions relatively similar to prior radiograph. Bibasilar, left greater right atelectasis although infiltrate is not excluded. Clinical correlation is recommended. No pneumothorax. There is atherosclerotic calcification of the aorta. Median sternotomy wires and postsurgical changes of CABG and heart valve replacement. Degenerative changes of the spine. No acute osseous pathology. IMPRESSION: Cardiomegaly with findings of CHF and small bilateral pleural effusions. Superimposed pneumonia is not excluded. Overall slight interval improvement in the aeration of the lungs since the radiograph of 08/24/2019. Electronically Signed   By: Anner Crete M.D.   On: 09/13/2019 16:22    Procedures Procedures (including critical care time)  Medications Ordered in ED Medications  furosemide (LASIX) injection 40 mg  (40 mg Intravenous Given 09/13/19 2113)    ED Course  I have reviewed the triage vital signs and the nursing notes.  Pertinent labs & imaging results that were available during my care of the patient were reviewed by me and considered in my medical decision making (see chart for details).    MDM Rules/Calculators/A&P                      86 year old female with a past medical history of CAD status post CABG and aortic valve replacement, heart failure with preserved ejection fraction with last EF of 65% on 08/25/2019, previous CVA presumed secondary to A. fib now on Eliquis, type 2 diabetes, HLD, HTN presenting to the emerge department complaining of shortness of breath associated with lower extremity swelling and 6 pound weight gain over the past several days.  Differential diagnoses considered include acute on chronic HF exacerbation, suspicion for ACS, PE, PTX, pneumonia, COVID-19, pericarditis or myocarditis  ECG interpreted by me demonstrated atrial fibrillation at 72 bpm, normal axis, mildly prolonged QTC at 464 ms otherwise normal  intervals, nonspecific T wave flattening, low voltage QRS  CXR interpreted by me demonstrated cardiomegaly with findings consistent with heart failure, small bilateral pleural effusions, interstitial edema  Labs demonstrated no significant leukocytosis, stable macrocytic anemia with hemoglobin 9.9 and MCV of 100.6 similar compared to previous, Bli[place] 276, no significant arrangements on BMP though noted to be mildly hypocalcemic at 8.7 similar compared to previous, BNP elevated to 686.2 suggestive of acute on chronic heart failure similar elevation compared to previous admission for acute heart failure exacerbation on 08/24/2019  Upon reassessment patient continued to have mild tachypnea and desaturated into the high 80s requiring 2 L of nasal cannula supplemental oxygen with appropriate response into the high 90s on O2  Given the above findings, my suspicion is  that patient likely has an acute on chronic heart failure exacerbation requiring admission for close monitoring and diuresis  We will admit to hospitalist for ongoing evaluation monitoring and management.  Care of this patient was discussed with Dr. Ashok Cordia, who directly supervised the care of this patient.  Final Clinical Impression(s) / ED Diagnoses Final diagnoses:  None    Rx / DC Orders ED Discharge Orders    None       Filbert Berthold, MD 09/13/19 [id number]    Lajean Saver, MD 09/14/19 [id number]

## 2019-09-13 NOTE — Telephone Encounter (Signed)
[facility]  [phone]  Patient breathing is labored and feet are swelling, patient weight has increased , 20 lasix given yesterday and today 20mg  of lasix, no change , patient O2 Sat lower 90.    Please Advise

## 2019-09-14 ENCOUNTER — Ambulatory Visit: Payer: Self-pay | Admitting: *Deleted

## 2019-09-14 ENCOUNTER — Telehealth: Payer: Self-pay

## 2019-09-14 DIAGNOSIS — R0602 Shortness of breath: Secondary | ICD-10-CM | POA: Diagnosis not present

## 2019-09-14 DIAGNOSIS — I5033 Acute on chronic diastolic (congestive) heart failure: Secondary | ICD-10-CM | POA: Diagnosis present

## 2019-09-14 DIAGNOSIS — I4821 Permanent atrial fibrillation: Secondary | ICD-10-CM

## 2019-09-14 LAB — CBC
HCT: 31.5 % — ABNORMAL LOW (ref 36.0–46.0)
Hemoglobin: 9.1 g/dL — ABNORMAL LOW (ref 12.0–15.0)
MCH: 28.1 pg (ref 26.0–34.0)
MCHC: 28.9 g/dL — ABNORMAL LOW (ref 30.0–36.0)
MCV: 97.2 fL (ref 80.0–100.0)
Platelets: 193 10*3/uL (ref 150–400)
RBC: 3.24 MIL/uL — ABNORMAL LOW (ref 3.87–5.11)
RDW: 17.2 % — ABNORMAL HIGH (ref 11.5–15.5)
WBC: 6.1 10*3/uL (ref 4.0–10.5)
nRBC: 0 % (ref 0.0–0.2)

## 2019-09-14 LAB — COMPREHENSIVE METABOLIC PANEL
ALT: 17 U/L (ref 0–44)
AST: 25 U/L (ref 15–41)
Albumin: 2.8 g/dL — ABNORMAL LOW (ref 3.5–5.0)
Alkaline Phosphatase: 78 U/L (ref 38–126)
Anion gap: 8 (ref 5–15)
BUN: 11 mg/dL (ref 8–23)
CO2: 34 mmol/L — ABNORMAL HIGH (ref 22–32)
Calcium: 8.6 mg/dL — ABNORMAL LOW (ref 8.9–10.3)
Chloride: 99 mmol/L (ref 98–111)
Creatinine, Ser: 0.91 mg/dL (ref 0.44–1.00)
GFR calc Af Amer: >60 mL/min (ref >60)
GFR calc non Af Amer: 54 mL/min — ABNORMAL LOW (ref >60)
Glucose, Bld: 90 mg/dL (ref 70–99)
Potassium: 3.5 mmol/L (ref 3.5–5.1)
Sodium: 141 mmol/L (ref 135–145)
Total Bilirubin: 0.4 mg/dL (ref 0.3–1.2)
Total Protein: 5.7 g/dL — ABNORMAL LOW (ref 6.5–8.1)

## 2019-09-14 LAB — SARS CORONAVIRUS 2 (TAT 6-24 HRS): SARS Coronavirus 2: NEGATIVE

## 2019-09-14 LAB — GLUCOSE, CAPILLARY
Glucose-Capillary: 101 mg/dL — ABNORMAL HIGH (ref 70–99)
Glucose-Capillary: 108 mg/dL — ABNORMAL HIGH (ref 70–99)

## 2019-09-14 LAB — TROPONIN I (HIGH SENSITIVITY)
Troponin I (High Sensitivity): 14 ng/L (ref <18)
Troponin I (High Sensitivity): 15 ng/L (ref <18)

## 2019-09-14 LAB — CBG MONITORING, ED
Glucose-Capillary: 81 mg/dL (ref 70–99)
Glucose-Capillary: 102 mg/dL — ABNORMAL HIGH (ref 70–99)
Glucose-Capillary: 98 mg/dL (ref 70–99)

## 2019-09-14 LAB — MAGNESIUM: Magnesium: 1.7 mg/dL (ref 1.7–2.4)

## 2019-09-14 LAB — TSH: TSH: 3.43 u[IU]/mL (ref 0.350–4.500)

## 2019-09-14 LAB — HEMOGLOBIN A1C
Hgb A1c MFr Bld: 6.1 % — ABNORMAL HIGH (ref 4.8–5.6)
Mean Plasma Glucose: 128.37 mg/dL

## 2019-09-14 LAB — PHOSPHORUS: Phosphorus: 3.8 mg/dL (ref 2.5–4.6)

## 2019-09-14 MED ORDER — ENOXAPARIN SODIUM 40 MG/0.4ML ~~LOC~~ SOLN
40.0000 mg | SUBCUTANEOUS | Status: DC
  Administered 2019-09-14 – 2019-09-17 (×4): 40 mg via SUBCUTANEOUS
  Filled 2019-09-14 (×4): qty 0.4

## 2019-09-14 MED ORDER — FUROSEMIDE 10 MG/ML IJ SOLN
40.0000 mg | Freq: Two times a day (BID) | INTRAMUSCULAR | Status: DC
  Administered 2019-09-14 – 2019-09-16 (×4): 40 mg via INTRAVENOUS
  Filled 2019-09-14 (×4): qty 4

## 2019-09-14 NOTE — Progress Notes (Signed)
PROGRESS NOTE    Rebecca Skinner  [id number] DOB: January 04, 1923 DOA: 09/13/2019 PCP: Colon Branch, MD  Brief Narrative:Rebecca Skinner is a 86 y.o. female with medical history significant for dementia, multiple CVA, PAF on Eliquis,chronic diastolic CHF,CAD s/pCABGx1 SVG to RCAand aortic valve replacement1998,HTN, HLD,DM 2,GERDGERD,andhypothyroidism,sick sinus syndrome presented to the ED with progressive shortness of breath, lower extremity edema for 3 to 4 days, 6 pound weight gain over the weekend  Work-up in the ED noted elevated BNP of 682, chest x-ray with cardiomegaly and bilateral pleural effusions, patient was noted to be mildly hypoxic to 88 L on room air   Assessment & Plan:   Acute on chronic diastolic CHF Moderate to severe tricuspid regurgitation RV dysfunction History of aortic valve replacement -Last echo in March with preserved EF, some RV dysfunction and severe TR -Clinically appears slightly volume overloaded, continue IV Lasix today -Monitor I's/O, daily BMP -Daily weights  Dementia -Delirium precautions -according to daughter Seroquel makes her very drowsy, she did get this last night, will stop Seroquel and monitor  Type 2 diabetes mellitus -Diet controlled, hemoglobin A1c in October was 7.2 -Continue sliding scale insulin  CAD/CABG -Stable, continue metoprolol and statin -continue imdur  Recurrent TIA/CVA -off anticoagulation now in setting of recurrent GI bleed  Chronic atrial fibrillation -Chads vas score is high at 6 -Eliquis stopped in the setting of recurrent GI bleeding -Continue metoprolol  Chronic anemia -Hemoglobin relatively stable now, off anticoagulation in the setting of recurrent GI bleeding -Seen by [facility] last week, plan to hold off on additional work-up given advanced age and overall frailty per daughter  ?  Dysphagia -Check SLP  DVT prophylaxis: Add Lovenox Code Status: DNR Family Communication: No family at bedside, called  and discussed with daughter Disposition Plan: Home pending improvement in volume status  Consultants:      Procedures:   Antimicrobials:    Subjective: -Patient seen in the emergency room, slightly confused, somnolent  Objective: Vitals:   09/14/19 0900 09/14/19 0915 09/14/19 0930 09/14/19 0945  BP: (!) 145/69 (!) 147/74 (!) 146/58 (!) 164/58  Pulse: (!) 137 74 73 78  Resp: 16 18 17 17   Temp:      TempSrc:      SpO2:  100% 99% 100%  Weight:      Height:       No intake or output data in the 24 hours ending 09/14/19 1012 Filed Weights   09/13/19 2223  Weight: 82.8 kg    Examination:  General exam: Chronically ill elderly female, somnolent, arousable, oriented to self only Respiratory system: Decreased breath sounds at the bases Cardiovascular system: S1 & S2 heard, RRR.  Central nervous system: Alert and oriented. No focal neurological deficits. Extremities: Trace edema Skin: No obvious rashes on exposed skin Psychiatry: Poor insight and judgment    Data Reviewed:   CBC: Recent Labs  Lab 09/09/19 1156 09/13/19 1608 09/14/19 0304  WBC 7.5 8.6 6.1  NEUTROABS 5.3  --   --   HGB 10.3* 9.9* 9.1*  HCT 33.2* 35.1* 31.5*  MCV 91.4 100.6* 97.2  PLT 315.0 276 0000000   Basic Metabolic Panel: Recent Labs  Lab 09/13/19 1608 09/14/19 0304  NA 140 141  K 4.1 3.5  CL 99 99  CO2 29 34*  GLUCOSE 129* 90  BUN 13 11  CREATININE 0.99 0.91  CALCIUM 8.7* 8.6*  MG  --  1.7  PHOS  --  3.8   GFR: Estimated Creatinine  Clearance: 38.6 mL/min (by C-G formula based on SCr of 0.91 mg/dL). Liver Function Tests: Recent Labs  Lab 09/14/19 0304  AST 25  ALT 17  ALKPHOS 78  BILITOT 0.4  PROT 5.7*  ALBUMIN 2.8*   No results for input(s): LIPASE, AMYLASE in the last 168 hours. No results for input(s): AMMONIA in the last 168 hours. Coagulation Profile: No results for input(s): INR, PROTIME in the last 168 hours. Cardiac Enzymes: No results for input(s): CKTOTAL,  CKMB, CKMBINDEX, TROPONINI in the last 168 hours. BNP (last 3 results) No results for input(s): PROBNP in the last 8760 hours. HbA1C: Recent Labs    09/14/19 0304  HGBA1C 6.1*   CBG: Recent Labs  Lab 09/13/19 2331 09/14/19 0339 09/14/19 0746  GLUCAP 156* 81 102*   Lipid Profile: No results for input(s): CHOL, HDL, LDLCALC, TRIG, CHOLHDL, LDLDIRECT in the last 72 hours. Thyroid Function Tests: Recent Labs    09/14/19 0304  TSH 3.430   Anemia Panel: No results for input(s): VITAMINB12, FOLATE, FERRITIN, TIBC, IRON, RETICCTPCT in the last 72 hours. Urine analysis:    Component Value Date/Time   COLORURINE YELLOW 08/24/2019 1155   APPEARANCEUR HAZY (A) 08/24/2019 1155   LABSPEC 1.012 08/24/2019 1155   PHURINE 5.0 08/24/2019 1155   GLUCOSEU NEGATIVE 08/24/2019 1155   GLUCOSEU NEGATIVE 01/30/2017 1229   HGBUR NEGATIVE 08/24/2019 1155   HGBUR negative 12/18/2009 0937   BILIRUBINUR NEGATIVE 08/24/2019 1155   BILIRUBINUR Neg 11/20/2011 1458   KETONESUR NEGATIVE 08/24/2019 1155   PROTEINUR 30 (A) 08/24/2019 1155   UROBILINOGEN 0.2 01/30/2017 1229   NITRITE NEGATIVE 08/24/2019 1155   LEUKOCYTESUR SMALL (A) 08/24/2019 1155   Sepsis Labs: @LABRCNTIP (procalcitonin:4,lacticidven:4)  ) Recent Results (from the past 240 hour(s))  SARS CORONAVIRUS 2 (TAT 6-24 HRS) Nasopharyngeal Nasopharyngeal Swab     Status: None   Collection Time: 09/13/19 10:41 PM   Specimen: Nasopharyngeal Swab  Result Value Ref Range Status   SARS Coronavirus 2 NEGATIVE NEGATIVE Final    Comment: (NOTE) SARS-CoV-2 target nucleic acids are NOT DETECTED. The SARS-CoV-2 RNA is generally detectable in upper and lower respiratory specimens during the acute phase of infection. Negative results do not preclude SARS-CoV-2 infection, do not rule out co-infections with other pathogens, and should not be used as the sole basis for treatment or other patient management decisions. Negative results must be  combined with clinical observations, patient history, and epidemiological information. The expected result is Negative. Fact Sheet for Patients: SugarRoll.be Fact Sheet for Healthcare Providers: https://www.woods-mathews.com/ This test is not yet approved or cleared by the [place] FDA and  has been authorized for detection and/or diagnosis of SARS-CoV-2 by FDA under an Emergency Use Authorization (EUA). This EUA will remain  in effect (meaning this test can be used) for the duration of the COVID-19 declaration under Section 56 4(b)(1) of the Act, 21 U.S.C. section 360bbb-3(b)(1), unless the authorization is terminated or revoked sooner. Performed at [facility], [place] [address] [place], [place] [address]          Radiology Studies: DG Chest 2 View  Result Date: 09/13/2019 CLINICAL DATA:  86 year old female with shortness of breath. EXAM: CHEST - 2 VIEW COMPARISON:  Chest radiograph dated 08/24/2019. FINDINGS: There is cardiomegaly with mild vascular congestion and small bilateral pleural effusions relatively similar to prior radiograph. Bibasilar, left greater right atelectasis although infiltrate is not excluded. Clinical correlation is recommended. No pneumothorax. There is atherosclerotic calcification of the aorta. Median sternotomy wires  and postsurgical changes of CABG and heart valve replacement. Degenerative changes of the spine. No acute osseous pathology. IMPRESSION: Cardiomegaly with findings of CHF and small bilateral pleural effusions. Superimposed pneumonia is not excluded. Overall slight interval improvement in the aeration of the lungs since the radiograph of 08/24/2019. Electronically Signed   By: Anner Crete M.D.   On: 09/13/2019 16:22        Scheduled Meds: . aspirin EC  81 mg Oral Daily  . atorvastatin  40 mg Oral q1800  . budesonide  0.5 mg Nebulization Daily  . furosemide  40 mg Intravenous Daily  .  insulin aspart  0-9 Units Subcutaneous Q4H  . isosorbide mononitrate  30 mg Oral Daily  . [START ON 09/15/2019] methimazole  5 mg Oral Q M,W,F  . metoprolol tartrate  50 mg Oral BID  . pantoprazole  40 mg Oral Daily  . potassium chloride  10 mEq Oral Daily  . QUEtiapine  25 mg Oral QHS  . sodium chloride flush  3 mL Intravenous Q12H   Continuous Infusions: . sodium chloride       LOS: 1 day    Time spent: 4min  Domenic Polite, MD [facility]  09/14/2019, 10:12 AM

## 2019-09-14 NOTE — Consult Note (Signed)
Cardiology Consultation:   Patient ID: BRAYLYNN HENDERSON; [id number]; 11/26/1922   Admit date: 09/13/2019 Date of Consult: 09/14/2019  Primary Care Provider: Colon Branch, MD Primary Cardiologist: Shelva Majestic, MD   Patient Profile:   Rebecca Skinner is a 86 y.o. female with a hx of paroxysmal atrial fibrillation on aspirin, hyperlipidemia, CAD s/p CABG X1 and bioprosthetic AVR 1998, multiple CVAs,  hypothyroidism, diabetes type 2, GI bleed, GERD and dementia who is being seen today for the evaluation of CHF at the request of Dr. Broadus John.  History of Present Illness:   Rebecca Skinner is a 86 year old female with a history as stated above who presented to [facility] on 09/14/2019 with progressive shortness of breath.  Patient lives at home with her daughter, her primary caregiver, who states that that patient's shortness of breath has worsened over the last 3 days and is with associated lower extremity edema and a 6 pound weight gain over the weekend. Given her symptoms, she was brought to the ED for further evaluation.   Rebecca Skinner has a history of lower GI bleed in 2015 with colonoscopy revealing AVM in the cecum area. She also has a history of CVA in 2015 after which Coumadin was switched to Eliquis and an episode of TIA in 08/2016.   Echocardiogram 01/27/2019 showed EF 60-65%, mild LVH, mild to moderate LAE, moderate RAE, moderate to severe TR and normal aortic valve prosthetic function.  She was admitted 08/09/2019 with concern for recurrent GI bleed and symptomatic anemia as she was having dark stools with some associated shortness of breath on exertion.  She was transfused 1 unit PRBCs and discharged home to follow with GI.  At that time, Plavix was stopped. GI recommended to continue to monitor.    On 08/24/2019 she was re-admitted with shortness of breath and chest pain at which time cardiology service followed along. It was recommended to continue with IV diuretics until her weight was under 170 pounds then  transition to p.o. Lasix 40 mg daily. Rebecca Skinner was also considered as a possible etiology for her diastolic heart failure, but in view of her age and advanced cognitive issues, aggressive treatment was not felt to be indicated.  At cardiology sign off she was continued on amlodipine 5 mg daily, atorvastatin 40 mg daily, furosemide 40 mg daily, Imdur 30 mg daily, metoprolol 50 mg BID. It was also recommended that she be switched from ASA 325 to ASA 81.   Unfortunately she presented back to the hospital today, 09/14/19 with SOB and weight gain. In the ED, BNP found to be elevated at 682 with subsequent CXR showing cardiomegaly and bilateral pleural effusions. Troponin found to be 14>> 15 with no complaints of chest pain.  Hemoglobin at 9.1>> however was at 10.3 on 09/09/2019.  HPI obtained from daughter who was at bedside. Patient received 25 mg of Seroquel overnight therefore patient is extremely somnolent today.  Per daughter's report, as above, she has been having increased difficulty with exertional dyspnea.  Daughter reports some diet indiscretions with eating takeout food many times in the past several weeks which could likely be contributing to her recurrent CHF exacerbation.  Reiterated the importance of restricted sodium diet with patients such as her mom.  Home Lasix therapy is 40 mg p.o. daily however daughter reports given an additional dose for the last 2 days with poor response. Daughter states she has not had complaints of chest pain, palpitations, orthopnea, dizziness or syncope. Did have some  lower extremity edema which does not appear to be significant on exam today.  She was admitted to internal medicine service with cardiology consultation given her persistent CHF.  Past Medical History:  Diagnosis Date  . Anxiety 11/20/2011  . Asthma    UNDER THE CARE [facility] DR PAZ  . CAD (coronary artery disease)    s/p CABG s/p AO valve replacement ([place] [facility])  . CVA (cerebral infarction) 08-2013     multiple, L, d/t Afib, started eloquis  . Diabetes mellitus    type 2   . Dizziness    Chronic, admiet 07-2010,saw neuro, thought to be a peripheral issue   . Dry skin   . GERD (gastroesophageal reflux disease)   . Hyperlipidemia   . Hypertension   . Hyperthyroidism   . Keloid    @ chest  . Memory loss   . Osteoarthritis   . Osteopenia    per dexa 12/09  . Paroxysmal atrial fibrillation ([place])    cards d/c coumadin 12/2008 d/t persisten NSR and frequent falls- restarted coumadin july 2011, now on Eliquis  . Recurrent UTI   . Shortness of breath dyspnea   . TIA (transient ischemic attack)     Past Surgical History:  Procedure Laterality Date  . ABDOMINAL HYSTERECTOMY    . AORTIC VALVE REPLACEMENT  1998  . CARDIAC VALVE REPLACEMENT    . CAROTID DOPPLER  07/13/12   BILATERAL BULB/PROXIMAL ICAS;MILD AMOUNT OF FIBROUS PLAQUE WITH NO DIAMETER REDUCTION.  . CESAREAN SECTION     x 2  . COLONOSCOPY WITH PROPOFOL N/A 03/30/2014   Procedure: COLONOSCOPY WITH PROPOFOL;  Surgeon: Irene Shipper, MD;  Location: [facility];  Service: Endoscopy;  Laterality: N/A;  . CORONARY ARTERY BYPASS GRAFT  1998   SVG TO RCA  . HEMORRHOID SURGERY    . JOINT REPLACEMENT    . MYOCARDIAL PERFUSION STUDY  12/19/09   NORMAL PATTERN OF PERFUSION IN ALL REGIONS.EF 75%.  . OOPHORECTOMY    . TOTAL KNEE ARTHROPLASTY  1999  . TRANSTHORACIC ECHOCARDIOGRAM  09/11/11   LVEF >55%.STAGE 1 DIASTOLIC DYSFUNCTION,ELEVATED LV FILLING PRESSURE.BIOPROSTHETIC AORTIC VALVE-PEAK AND MEAN GRADIENTS OF 17 MMHG AND 8 MMHG.SIGMOID SEPTUM.MILD TO MOD MR.MILD TO MOD TR.RVSP 46 MMHG.     Prior to Admission medications   Medication Sig Start Date End Date Taking? Authorizing Provider  acetaminophen (TYLENOL) 500 MG tablet Take 1,000 mg by mouth every 6 (six) hours as needed (for pain).    Yes [provider]  albuterol (ACCUNEB) 0.63 MG/3ML nebulizer solution Take 3 mLs by nebulization every 6 (six) hours as needed for  wheezing or shortness of breath. 05/25/19  Yes Paz, Alda Berthold, MD  amLODipine (NORVASC) 10 MG tablet Take 10 mg by mouth daily.   Yes [provider]  aspirin EC 81 MG tablet Take 1 tablet (81 mg total) by mouth daily. 08/16/19  Yes Paz, Alda Berthold, MD  atorvastatin (LIPITOR) 40 MG tablet TAKE 1 TABLET BY MOUTH EVERYDAY AT BEDTIME Patient taking differently: Take 40 mg by mouth at bedtime.  06/18/19  Yes Paz, Alda Berthold, MD  bismuth subsalicylate (PEPTO BISMOL) 262 MG chewable tablet Chew 524 mg by mouth as needed for indigestion or diarrhea or loose stools.   Yes [provider]  budesonide (PULMICORT) 0.5 MG/2ML nebulizer solution Take 2 mLs (0.5 mg total) by nebulization daily. 03/26/19  Yes Paz, Alda Berthold, MD  Calcium Carbonate-Vitamin D (CALCIUM 500 + D) 500-125 MG-UNIT TABS Take 1 tablet  by mouth at bedtime.   Yes [provider]  furosemide (LASIX) 40 MG tablet Take 1 tablet (40 mg total) by mouth daily. 08/31/19  Yes Oswald Hillock, MD  isosorbide mononitrate (IMDUR) 30 MG 24 hr tablet TAKE 1 TABLET BY MOUTH EVERY DAY Patient taking differently: Take 30 mg by mouth daily.  12/21/18  Yes Troy Sine, MD  magnesium oxide (MAG-OX) 400 MG tablet Take 1 tablet (400 mg total) by mouth daily. 03/15/19  Yes Paz, Alda Berthold, MD  methimazole (TAPAZOLE) 5 MG tablet TAKE 1 TABLET BY MOUTH 3 TIMES A WEEK Patient taking differently: Take 5 mg by mouth every Monday, Wednesday, and Friday.  09/23/18  Yes Paz, Alda Berthold, MD  metoprolol tartrate (LOPRESSOR) 50 MG tablet Take 1 tablet (50 mg total) by mouth 2 (two) times daily. 07/09/19  Yes Paz, Alda Berthold, MD  nitroGLYCERIN (NITROSTAT) 0.4 MG SL tablet Place 1 tablet (0.4 mg total) under the tongue every 5 (five) minutes x 3 doses as needed for chest pain. 05/25/19  Yes Paz, Alda Berthold, MD  potassium chloride (KLOR-CON M10) 10 MEQ tablet Take 1 tablet (10 mEq total) by mouth daily. 07/09/19  Yes Paz, Alda Berthold, MD  QUEtiapine (SEROQUEL) 25 MG tablet Take 1 tablet (25 mg  total) by mouth at bedtime. Patient taking differently: Take 25 mg by mouth at bedtime as needed (Sleep).  08/16/19  Yes Paz, Alda Berthold, MD  amLODipine (NORVASC) 5 MG tablet Take 1 tablet (5 mg total) by mouth daily. 08/31/19   Oswald Hillock, MD  esomeprazole (NEXIUM) 40 MG capsule Take 1 capsule (40 mg total) by mouth daily as needed. Patient not taking: Reported on 09/14/2019 08/12/19   Colon Branch, MD    Inpatient Medications: Scheduled Meds: . aspirin EC  81 mg Oral Daily  . atorvastatin  40 mg Oral q1800  . budesonide  0.5 mg Nebulization Daily  . furosemide  40 mg Intravenous Daily  . insulin aspart  0-9 Units Subcutaneous Q4H  . isosorbide mononitrate  30 mg Oral Daily  . [START ON 09/15/2019] methimazole  5 mg Oral Q M,W,F  . metoprolol tartrate  50 mg Oral BID  . pantoprazole  40 mg Oral Daily  . potassium chloride  10 mEq Oral Daily  . QUEtiapine  25 mg Oral QHS  . sodium chloride flush  3 mL Intravenous Q12H   Continuous Infusions: . sodium chloride     PRN Meds: sodium chloride, acetaminophen **OR** acetaminophen, albuterol, ondansetron **OR** ondansetron (ZOFRAN) IV, sodium chloride flush  Allergies:    Allergies  Allergen Reactions  . Hydrocodone Itching and Nausea Only  . Tramadol Hcl Itching and Nausea Only    Social History:   Social History   Socioeconomic History  . Marital status: Divorced    Spouse name: Not on file  . Number of children: 4  . Years of education: Not on file  . Highest education level: Not on file  Occupational History  . Occupation: retired     Fish farm manager: RETIRED  Tobacco Use  . Smoking status: Former Research scientist (life sciences)  . Smokeless tobacco: Never Used  . Tobacco comment: quit 2004, smoked 1.5 ppd  Substance and Sexual Activity  . Alcohol use: No    Alcohol/week: 0.0 standard drinks  . Drug use: No  . Sexual activity: Not Currently  Other Topics Concern  . Not on file  Social History Narrative   Had to moved w/ [name]  ~ [phone]  Had 3 daughter-  2 son  (lost oldest daughter and son), 2 living daughters in [place]   Social Determinants of Health   Financial Resource Strain: Low Risk   . Difficulty of Paying Living Expenses: Not hard at all  Food Insecurity: No Food Insecurity  . Worried About Charity fundraiser in the Last Year: Never true  . Ran Out of Food in the Last Year: Never true  Transportation Needs: No Transportation Needs  . Lack of Transportation (Medical): No  . Lack of Transportation (Non-Medical): No  Physical Activity: Inactive  . Days of Exercise per Week: 0 days  . Minutes of Exercise per Session: 0 min  Stress:   . Feeling of Stress :   Social Connections:   . Frequency of Communication with Friends and Family:   . Frequency of Social Gatherings with Friends and Family:   . Attends Religious Services:   . Active Member of Clubs or Organizations:   . Attends Archivist Meetings:   Marland Kitchen Marital Status:   Intimate Partner Violence:   . Fear of Current or Ex-Partner:   . Emotionally Abused:   Marland Kitchen Physically Abused:   . Sexually Abused:     Family History:   Family History  Problem Relation Age of Onset  . Heart attack Father   . Coronary artery disease Son 85       deceased  . Hypertension Brother   . Stroke Brother   . Colon cancer Neg Hx   . Breast cancer Neg Hx   . Thyroid disease Neg Hx    Family Status:  Family Status  Relation Name Status  . Father  Deceased  . Mother  Deceased  . Son  (Not Specified)  . Brother  (Not Specified)  . Brother  (Not Specified)  . MGM  Deceased  . MGF  Deceased  . PGM  Deceased  . PGF  Deceased  . Neg Hx  (Not Specified)    ROS:  Please see the history of present illness.  All other ROS reviewed and negative.     Physical Exam/Data:   Vitals:   09/14/19 0900 09/14/19 0915 09/14/19 0930 09/14/19 0945  BP: (!) 145/69 (!) 147/74 (!) 146/58 (!) 164/58  Pulse: (!) 137 74 73 78  Resp: 16 18 17 17   Temp:      TempSrc:      SpO2:  100% 99%  100%  Weight:      Height:       No intake or output data in the 24 hours ending 09/14/19 1029 Filed Weights   09/13/19 2223  Weight: 82.8 kg   Body mass index is 32.35 kg/m.   General: Elderly, somnolent, NAD Skin: Warm, dry, intact  Neck: Negative for carotid bruits. No JVD Lungs:Clear to ausculation bilaterally. No wheezes, rales, or rhonchi. Breathing is unlabored. Cardiovascular: Irregularly irregular with S1 S2. + murmur Abdomen: Soft, non-tender, non-distended. No obvious abdominal masses. Extremities: 1-2+ BLE edema. DP/PT pulses 2+ bilaterally Neuro: Somnolent.  Psych: Somnolent    EKG:  The EKG was personally reviewed and demonstrates: 09/13/2019 atrial fibrillation with HR 72 bpm no acute changes, similar to prior tracing Telemetry:  Telemetry was personally reviewed and demonstrates: 09/14/2019 atrial fibrillation with rates in the 60s to 70s  Relevant CV Studies:  Echo 08/25/19: 1. Left ventricular ejection fraction, by estimation, is 60 to 65%. The  left ventricle has normal function. The left ventricle has no regional  wall motion  abnormalities. There is moderate left ventricular hypertrophy.  Left ventricular diastolic  parameters are indeterminate. There is the interventricular septum is  flattened in systole and diastole, consistent with right ventricular  pressure and volume overload.  2. Right ventricular systolic function is mildly reduced. The right  ventricular size is severely enlarged. There is moderately elevated  pulmonary artery systolic pressure. The estimated right ventricular  systolic pressure is 123456 mmHg.  3. Left atrial size was mildly dilated.  4. Right atrial size was severely dilated.  5. The mitral valve is normal in structure. Trivial mitral valve  regurgitation.  6. Tricuspid valve regurgitation is moderate to severe.  7. The aortic valve has been repaired/replaced. Aortic valve  regurgitation is trivial. There is a  bioprosthetic valve present in the  aortic position. Normal bioprosthetic function. Mean gradient 21mmHg, EOA  2.1 cm^2, DI 0.62  8. Aortic dilatation noted. There is mild dilatation of the ascending  aorta measuring 37 mm.  9. The inferior vena cava is dilated in size with <50% respiratory  variability, suggesting right atrial pressure of 15 mmHg.   Laboratory Data:  Chemistry Recent Labs  Lab 09/13/19 1608 09/14/19 0304  NA 140 141  K 4.1 3.5  CL 99 99  CO2 29 34*  GLUCOSE 129* 90  BUN 13 11  CREATININE 0.99 0.91  CALCIUM 8.7* 8.6*  GFRNONAA 49* 54*  GFRAA 57* >60  ANIONGAP 12 8    Total Protein  Date Value Ref Range Status  09/14/2019 5.7 (L) 6.5 - 8.1 g/dL Final   Albumin  Date Value Ref Range Status  09/14/2019 2.8 (L) 3.5 - 5.0 g/dL Final   AST  Date Value Ref Range Status  09/14/2019 25 15 - 41 U/L Final   ALT  Date Value Ref Range Status  09/14/2019 17 0 - 44 U/L Final   Alkaline Phosphatase  Date Value Ref Range Status  09/14/2019 78 38 - 126 U/L Final   Total Bilirubin  Date Value Ref Range Status  09/14/2019 0.4 0.3 - 1.2 mg/dL Final   Hematology Recent Labs  Lab 09/09/19 1156 09/13/19 1608 09/14/19 0304  WBC 7.5 8.6 6.1  RBC 3.63* 3.49* 3.24*  HGB 10.3* 9.9* 9.1*  HCT 33.2* 35.1* 31.5*  MCV 91.4 100.6* 97.2  MCH  --  28.4 28.1  MCHC 31.1 28.2* 28.9*  RDW 17.2* 17.3* 17.2*  PLT 315.0 276 193   Cardiac EnzymesNo results for input(s): TROPONINI in the last 168 hours. No results for input(s): TROPIPOC in the last 168 hours.  BNP Recent Labs  Lab 09/13/19 1608  BNP 686.2*    DDimer No results for input(s): DDIMER in the last 168 hours. TSH:  Lab Results  Component Value Date   TSH 3.430 09/14/2019   Lipids: Lab Results  Component Value Date   CHOL 97 05/23/2019   HDL 43 05/23/2019   LDLCALC 49 05/23/2019   TRIG 27 05/23/2019   CHOLHDL 2.3 05/23/2019   HgbA1c: Lab Results  Component Value Date   HGBA1C 6.1 (H)  09/14/2019    Radiology/Studies:  DG Chest 2 View  Result Date: 09/13/2019 CLINICAL DATA:  86 year old female with shortness of breath. EXAM: CHEST - 2 VIEW COMPARISON:  Chest radiograph dated 08/24/2019. FINDINGS: There is cardiomegaly with mild vascular congestion and small bilateral pleural effusions relatively similar to prior radiograph. Bibasilar, left greater right atelectasis although infiltrate is not excluded. Clinical correlation is recommended. No pneumothorax. There is atherosclerotic calcification of the aorta. Median  sternotomy wires and postsurgical changes of CABG and heart valve replacement. Degenerative changes of the spine. No acute osseous pathology. IMPRESSION: Cardiomegaly with findings of CHF and small bilateral pleural effusions. Superimposed pneumonia is not excluded. Overall slight interval improvement in the aeration of the lungs since the radiograph of 08/24/2019. Electronically Signed   By: Anner Crete M.D.   On: 09/13/2019 16:22   Assessment and Plan:   1. Acute on chronic diastolic heart failure with moderate to severe pulmonary hypertension: -Patient presented to the ED with a 3 to 4-day history of progressive shortness of breath and LE edema per daughter's report. -BNP found to be elevated at 682 with subsequent CXR showing cardiomegaly and bilateral pleural effusions. Troponin found to be 14>> 15 with no complaints of chest pain.  Hemoglobin at 9.1>> however was at 10.3 on 09/09/2019. Daughter reports some diet indiscretions with eating takeout food many times in the past several weeks which could likely be contributing to her recurrent CHF exacerbation.  Patient was given an additional dose of 40 mg x 2 days given her symptoms with poor response.   -Discharge weight from last hospital admission was 183.7lb -Weight today, 182.6lb -Reiterated the importance of restricted sodium diet -Last echocardiogram with stable LVEF and RVSP 69 mmHg -Creatinine stable at 0.91  today -Would increase her Lasix IV to 40 mg twice daily and monitor response -Daily BMET -Strict oral intake, I&Os  2. Permanent atrial fibrillation: -EKG with atrial fibrillation with rate control at 72 bpm -Continue PTA metoprolol tartate 50 mg BID -Not on anticoagulation due to severe GI bleeding history  3.  CAD s/p CABG: -Per daughter, patient has had no recent complaints of anginal symptoms -Continue atorvastatin 40 mg, ASA 81, Imdur 30  4. Bioprosthetic AVR: -Stable on last echocardiogram -Continue ASA 81  5. History of multiple CVAs: -Continue ASA, statin   6. Hypertension: -Stable, 122/58, 141/56, 147/74  -PTA amlodipine 5 mg -Continue metoprolol 50 mg twice daily  7.  History of GI bleed: -Has had several hospitalizations with continued dark, tarry stools followed in the outpatient setting by GI -Last seen 09/09/2019 with plan to continue close monitoring of hemoglobin -No aggressive invasive procedure given advanced age and other comorbid conditions -Continue to hold anticoagulation/antiplatelet therapies   For questions or updates, please contact [place] Please consult [email or web address] for contact info under Cardiology/STEMI.   SignedKathyrn Drown NP-C [facility] Pager: [phone] 09/14/2019 10:29 AM

## 2019-09-14 NOTE — ED Notes (Signed)
Discussed night medications with daughter [name] via telephone. Pt has not taken night time medication. RN to given lopressor and seroquel.

## 2019-09-14 NOTE — TOC Initial Note (Signed)
Transition of Care [facility]) - Initial/Assessment Note    Patient Details  Name: KINBERLY PENALVER MRN: [id number] Date of Birth: 1922/11/30  Transition of Care [facility]) CM/SW Contact:    Verdell Carmine, RN Phone Number: 09/14/2019, 9:56 AM  Clinical Narrative:                  Patient in the ED, admitted currently awaiting bed . Attempt to reach the family unsuccessful at this time. Will await a call back to discuss  Home care needs and plan for home versus placement after discharge.         Patient Goals and CMS Choice        Expected Discharge Plan and Services      home with home health versus placement                                          Prior Living Arrangements/Services     at home with daughter                   Activities of Daily Living      Permission Sought/Granted                  Emotional Assessment    is currently obtunded,           Admission diagnosis:  CHF exacerbation ([place]) [I50.9] Patient Active Problem List   Diagnosis Date Noted  . Acute on chronic diastolic CHF (congestive heart failure) ([place]) 09/14/2019  . CHF exacerbation ([place]) 09/13/2019  . Acute exacerbation of CHF (congestive heart failure) ([place]) 08/24/2019  . DNR (do not resuscitate) 08/24/2019  . Melena 08/09/2019  . Stroke-like symptom   . Hypokalemia 05/22/2019  . Chest pain 06/21/2017  . TIA (transient ischemic attack) 08/30/2016  . Acute kidney injury ([place]) 03/05/2016  . Exposure keratitis 09/23/2015  . Keratopathy, band 09/13/2015  . PCP NOTES >>>>> 04/05/2015  . Chronic diastolic heart failure ([place]) 12/29/2014  . Edema 12/20/2014  . Dyspnea 12/20/2014  . Angiectasia 03/30/2014  . GI bleed 03/28/2014  . Dementia with behavioral disturbance ([place]) 09/16/2013  . Annual physical exam 11/21/2011  . Macrocytic anemia 07/17/2010  . ATRIAL FIBRILLATION, chronic 03/05/2007  . Diabetes mellitus with circulatory complication ([place]) [id number]  . S/P AVR  (aortic valve replacement) 10/15/2006  . Hyperthyroidism 09/03/2006  . Dyslipidemia 09/03/2006  . HTN (hypertension) 09/03/2006  . CAD (coronary artery disease) of artery bypass graft 09/03/2006  . Asthma, chronic 09/03/2006   PCP:  Colon Branch, MD Pharmacy:   [id number] [place], [place] [address] Phone: [phone] Fax: [phone]     Social Determinants of Health (SDOH) Interventions    Readmission Risk Interventions No flowsheet data found.

## 2019-09-14 NOTE — ED Notes (Signed)
Lunch Tray Ordered @ 1052.  

## 2019-09-14 NOTE — ED Notes (Signed)
Breakfast Ordered 

## 2019-09-15 ENCOUNTER — Inpatient Hospital Stay (HOSPITAL_COMMUNITY): Payer: Medicare Other

## 2019-09-15 ENCOUNTER — Ambulatory Visit: Payer: Self-pay | Admitting: *Deleted

## 2019-09-15 LAB — BASIC METABOLIC PANEL
Anion gap: 10 (ref 5–15)
BUN: 10 mg/dL (ref 8–23)
CO2: 37 mmol/L — ABNORMAL HIGH (ref 22–32)
Calcium: 9 mg/dL (ref 8.9–10.3)
Chloride: 94 mmol/L — ABNORMAL LOW (ref 98–111)
Creatinine, Ser: 0.97 mg/dL (ref 0.44–1.00)
GFR calc Af Amer: 58 mL/min — ABNORMAL LOW (ref >60)
GFR calc non Af Amer: 50 mL/min — ABNORMAL LOW (ref >60)
Glucose, Bld: 121 mg/dL — ABNORMAL HIGH (ref 70–99)
Potassium: 4.4 mmol/L (ref 3.5–5.1)
Sodium: 141 mmol/L (ref 135–145)

## 2019-09-15 LAB — GLUCOSE, CAPILLARY
Glucose-Capillary: 134 mg/dL — ABNORMAL HIGH (ref 70–99)
Glucose-Capillary: 119 mg/dL — ABNORMAL HIGH (ref 70–99)
Glucose-Capillary: 112 mg/dL — ABNORMAL HIGH (ref 70–99)
Glucose-Capillary: 179 mg/dL — ABNORMAL HIGH (ref 70–99)
Glucose-Capillary: 125 mg/dL — ABNORMAL HIGH (ref 70–99)
Glucose-Capillary: 154 mg/dL — ABNORMAL HIGH (ref 70–99)

## 2019-09-15 MED ORDER — INSULIN GLARGINE 100 UNIT/ML ~~LOC~~ SOLN
5.0000 [IU] | Freq: Every day | SUBCUTANEOUS | Status: DC
  Administered 2019-09-15 – 2019-09-17 (×3): 5 [IU] via SUBCUTANEOUS
  Filled 2019-09-15 (×3): qty 0.05

## 2019-09-15 MED ORDER — ALBUTEROL SULFATE (2.5 MG/3ML) 0.083% IN NEBU: 2.5000 mg | INHALATION_SOLUTION | Freq: Four times a day (QID) | RESPIRATORY_TRACT | Status: DC | PRN

## 2019-09-15 NOTE — Consult Note (Signed)
   [facility] CM Inpatient Consult   09/15/2019  Rebecca Skinner Sep 14, 1922 [id number]   Alert by inpatient TOC RNCM of patient's readmission and post hospital follow up needs.  .Patient is currently active with [facility] for chronic disease management services.  Patient has been engaged by a [facility] Management Coordinator and [facility] Social Worker.   Our community based plan of care had just started to establish contact for disease management and community resource support prior to readmission. This is a less than 30 day readmission hospitalization and extreme high risk score for unplanned readmission of 30% currently.  Plan: Follow up with [facility] RNCM and [facility] Social Worker for post hospital needs for potential personal care services.   Of note, [facility] Care Management services does not replace or interfere with any services that are needed or arranged by inpatient [facility] care management team.  For additional questions or referrals please contact:  Natividad Brood, RN BSN [facility] Liaison  [phone] business mobile phone Toll free office [phone]  Fax number: [phone] [email or web address]

## 2019-09-15 NOTE — Progress Notes (Signed)
Subjective:   Crying has heating back on left neck seems more wheezy   Objective:  Vitals:   09/15/19 0041 09/15/19 0500 09/15/19 0800 09/15/19 0814  BP: (!) 144/74 (!) 138/95 (!) 160/64   Pulse: 77 87  84  Resp: 18 18 (!) 35 18  Temp: 98.5 F (36.9 C) 98.4 F (36.9 C)    TempSrc: Oral Oral    SpO2: 91% 100%  93%  Weight:  77 kg    Height:        Intake/Output from previous day:  Intake/Output Summary (Last 24 hours) at 09/15/2019 0920 Last data filed at 09/15/2019 0500 Gross per 24 hour  Intake 240 ml  Output 3050 ml  Net -2810 ml    Physical Exam:  Crying  Chronically ill elderly black female   HEENT: normal Pain to palpation over left trapezius  JVP normal no bruits no thyromegaly Lungs clear with no wheezing and good diaphragmatic motion Heart:  S1/S2 no murmur, no rub, gallop or click PMI normal Abdomen: benighn, BS positve, no tenderness, no AAA no bruit.  No HSM or HJR Distal pulses intact with no bruits No edema Neuro non-focal Skin warm and dry No muscular weakness   Lab Results: Basic Metabolic Panel: Recent Labs    09/13/19 1608 09/14/19 0304  NA 140 141  K 4.1 3.5  CL 99 99  CO2 29 34*  GLUCOSE 129* 90  BUN 13 11  CREATININE 0.99 0.91  CALCIUM 8.7* 8.6*  MG  --  1.7  PHOS  --  3.8   Liver Function Tests: Recent Labs    09/14/19 0304  AST 25  ALT 17  ALKPHOS 78  BILITOT 0.4  PROT 5.7*  ALBUMIN 2.8*   No results for input(s): LIPASE, AMYLASE in the last 72 hours. CBC: Recent Labs    09/13/19 1608 09/14/19 0304  WBC 8.6 6.1  HGB 9.9* 9.1*  HCT 35.1* 31.5*  MCV 100.6* 97.2  PLT 276 193   Hemoglobin A1C: Recent Labs    09/14/19 0304  HGBA1C 6.1*  Thyroid Function Tests: Recent Labs    09/14/19 0304  TSH 3.430    Imaging: DG Chest 2 View  Result Date: 09/13/2019 CLINICAL DATA:  86 year old female with shortness of breath. EXAM: CHEST - 2 VIEW COMPARISON:  Chest radiograph dated 08/24/2019. FINDINGS: There is  cardiomegaly with mild vascular congestion and small bilateral pleural effusions relatively similar to prior radiograph. Bibasilar, left greater right atelectasis although infiltrate is not excluded. Clinical correlation is recommended. No pneumothorax. There is atherosclerotic calcification of the aorta. Median sternotomy wires and postsurgical changes of CABG and heart valve replacement. Degenerative changes of the spine. No acute osseous pathology. IMPRESSION: Cardiomegaly with findings of CHF and small bilateral pleural effusions. Superimposed pneumonia is not excluded. Overall slight interval improvement in the aeration of the lungs since the radiograph of 08/24/2019. Electronically Signed   By: Anner Crete M.D.   On: 09/13/2019 16:22    Cardiac Studies:  ECG: afib rate 72 nonspecific ST/T wave changes    Telemetry: afib rates ok no long pauses 09/15/2019   Echo:  08/25/19 EF 60-65% normal functioning tissue AVR   Medications:    aspirin EC  81 mg Oral Daily   atorvastatin  40 mg Oral q1800   budesonide  0.5 mg Nebulization Daily   enoxaparin (LOVENOX) injection  40 mg Subcutaneous Q24H   furosemide  40 mg Intravenous BID   insulin aspart  0-9  Units Subcutaneous Q4H   isosorbide mononitrate  30 mg Oral Daily   methimazole  5 mg Oral Q M,W,F   metoprolol tartrate  50 mg Oral BID   pantoprazole  40 mg Oral Daily   potassium chloride  10 mEq Oral Daily   sodium chloride flush  3 mL Intravenous Q12H      sodium chloride      Assessment/Plan:  1. Acute on chronic diastolic heart failure with moderate to severe pulmonary hypertension: -BNP 682 good diuresis on bid iv lasix only trace pedal edema weight stable from recent admission    2. Permanent atrial fibrillation: -chronic stable rate on lopressor bid no anticoagulation due to age and GI bleeding history    3.  CAD s/p CABG: -Per daughter, patient has had no recent complaints of anginal symptoms -Continue atorvastatin 40 mg,  ASA 81, Imdur 30   4. Bioprosthetic AVR: -Stable on last echocardiogram 08/25/19  -Continue ASA 81   5. History of multiple CVAs: -Continue ASA, statin    6. Hypertension: - up a bit this am due to pain continue diuretic and beta blocker    7.  History of GI bleed: -  Hct stable 31.5 yesterday   8.: Pulmonary:  - More wheezy this am repeat CXR add nebs per primary service  9. Torticollis:  - Pain management and heating pad per primary service   Jenkins Rouge 09/15/2019, 9:20 AM

## 2019-09-15 NOTE — Evaluation (Signed)
Clinical/Bedside Swallow Evaluation Patient Details  Name: Rebecca Skinner MRN: [id number] Date of Birth: [birth year]  Today's Date: 09/15/2019 Time: SLP Start Time (ACUTE ONLY): 1110 SLP Stop Time (ACUTE ONLY): 1120 SLP Time Calculation (min) (ACUTE ONLY): 10 min  Past Medical History:  Past Medical History:  Diagnosis Date  . Anxiety 11/20/2011  . Asthma    [facility] DR PAZ  . CAD (coronary artery disease)    s/p CABG s/p AO valve replacement ([place] [facility])  . CVA (cerebral infarction) 08-2013   multiple, L, d/t Afib, started eloquis  . Diabetes mellitus    type 2   . Dizziness    Chronic, admiet 07-2010,saw neuro, thought to be a peripheral issue   . Dry skin   . GERD (gastroesophageal reflux disease)   . Hyperlipidemia   . Hypertension   . Hyperthyroidism   . Keloid    @ chest  . Memory loss   . Osteoarthritis   . Osteopenia    per dexa 12/09  . Paroxysmal atrial fibrillation ([place])    cards d/c coumadin 12/2008 d/t persisten NSR and frequent falls- restarted coumadin july 2011, now on Eliquis  . Recurrent UTI   . Shortness of breath dyspnea   . TIA (transient ischemic attack)    Past Surgical History:  Past Surgical History:  Procedure Laterality Date  . ABDOMINAL HYSTERECTOMY    . AORTIC VALVE REPLACEMENT  1998  . CARDIAC VALVE REPLACEMENT    . CAROTID DOPPLER  07/13/12   BILATERAL BULB/PROXIMAL ICAS;MILD AMOUNT OF FIBROUS PLAQUE WITH NO DIAMETER REDUCTION.  . CESAREAN SECTION     x 2  . COLONOSCOPY WITH PROPOFOL N/A 03/30/2014   Procedure: COLONOSCOPY WITH PROPOFOL;  Surgeon: Irene Shipper, MD;  Location: [facility];  Service: Endoscopy;  Laterality: N/A;  . CORONARY ARTERY BYPASS GRAFT  1998   SVG TO RCA  . HEMORRHOID SURGERY    . JOINT REPLACEMENT    . MYOCARDIAL PERFUSION STUDY  12/19/09   NORMAL PATTERN OF PERFUSION IN ALL REGIONS.EF 75%.  . OOPHORECTOMY    . TOTAL KNEE ARTHROPLASTY  1999  . TRANSTHORACIC ECHOCARDIOGRAM  09/11/11   LVEF >55%.STAGE 1  DIASTOLIC DYSFUNCTION,ELEVATED LV FILLING PRESSURE.BIOPROSTHETIC AORTIC VALVE-PEAK AND MEAN GRADIENTS OF 17 MMHG AND 8 MMHG.SIGMOID SEPTUM.MILD TO MOD MR.MILD TO MOD TR.RVSP 46 MMHG.   HPI:  Pt is a 86 y.o. female with medical history significant of multiple CVA, PAF on Eliquis,chronic diastolic CHF,CAD s/pCABGx1 SVG to RCAand aortic valve replacement1998,HTN, HLD,DM 2,GERDGERD,dementia, andhypothyroidism,sick sinus syndrome who presented with worsening shortness of breath and leg edema. CXR 4/7: Left basilar/retrocardiac opacities and small left pleural effusion are unchanged. BSE in 2017: functional oropharyngeal swallow with no overt indication of aspiration.   Assessment / Plan / Recommendation Clinical Impression  Pt was seen for bedside swallow evaluation and she denied a history of dysphagia. Oral mechanism exam was limited due to pt's difficulty following commands; however, oral motor strength and ROM appeared grossly WFL. She presented with full dentures. She tolerated all solids and liquids without signs or symptoms of oropharyngeal dysphagia. It is recommended that a regular texture diet be continued. SLP will see pt once more, preferably during a meal, to ensure continued tolerance of the current diet but further skilled SLP services will likely not be clinically indicated beyond that point.  SLP Visit Diagnosis: Dysphagia, unspecified (R13.10)    Aspiration Risk  No limitations    Diet Recommendation Regular;Thin liquid  Liquid Administration via: Cup;Straw Medication Administration: Whole meds with puree Supervision: Patient able to self feed Compensations: Minimize environmental distractions Postural Changes: Seated upright at 90 degrees    Other  Recommendations Oral Care Recommendations: Oral care BID   Follow up Recommendations None      Frequency and Duration min 1 x/week  2 weeks       Prognosis Prognosis for Safe Diet Advancement: Good Barriers to  Reach Goals: Cognitive deficits      Swallow Study   General Date of Onset: 09/14/19 HPI: Pt is a 86 y.o. female with medical history significant of multiple CVA, PAF on Eliquis,chronic diastolic CHF,CAD s/pCABGx1 SVG to RCAand aortic valve replacement1998,HTN, HLD,DM 2,GERDGERD,dementia, andhypothyroidism,sick sinus syndrome who presented with worsening shortness of breath and leg edema. CXR 4/7: Left basilar/retrocardiac opacities and small left pleural effusion are unchanged. BSE in 2017: functional oropharyngeal swallow with no overt indication of aspiration. Type of Study: Bedside Swallow Evaluation Previous Swallow Assessment: See HPI Diet Prior to this Study: Regular;Thin liquids Temperature Spikes Noted: No Respiratory Status: Room air History of Recent Intubation: No Behavior/Cognition: Alert;Cooperative;Pleasant mood Oral Cavity Assessment: Within Functional Limits Oral Care Completed by SLP: No Oral Cavity - Dentition: Dentures, top;Dentures, bottom Vision: Functional for self-feeding Self-Feeding Abilities: Able to feed self Patient Positioning: Upright in bed;Postural control adequate for testing Baseline Vocal Quality: Normal Volitional Cough: Strong Volitional Swallow: Able to elicit    Oral/Motor/Sensory Function Overall Oral Motor/Sensory Function: Within functional limits   Ice Chips Ice chips: Within functional limits Presentation: Spoon   Thin Liquid Thin Liquid: Within functional limits    Nectar Thick Nectar Thick Liquid: Not tested   Honey Thick Honey Thick Liquid: Not tested   Puree Puree: Within functional limits Presentation: Spoon   Solid     Solid: Within functional limits Presentation: Spoon;Self Fed     Theodor Mustin I. Hardin Negus, [place], [place] Office number [phone] Pager [place] 09/15/2019,1:39 PM

## 2019-09-15 NOTE — Progress Notes (Signed)
[facility] ([facility]) Community Based Palliative Care       This patient is enrolled in our palliative care services in the community.  [facility] will continue to follow for any discharge planning needs and to coordinate continuation of palliative care.   If you have questions or need assistance, please call [phone] or contact the hospital Liaison listed on AMION.        Phaedra Anne Robertson, RN, CCM  [facility] Hospital Liaison   [phone]  

## 2019-09-15 NOTE — Progress Notes (Signed)
P Acute superimposed HFpEF, moderate to severe tricuspid regurg with RV dysfunction Prior aortic valve replacement 1998 Cardiology input appreciated-continuing Lasix IV 40 twice daily--2.8 L weight 82-->77kg [169]-her dry weight was noted on last admission to possibly be close to 170 CXR from this morning small pleural effusion cardiomegaly Patient is becoming slightly alkalotic so may need to adjust Lasix dose as per cardiology  Sick sinus syndrome A. fib CHADS2 score >4 Keep on telemetry for now as it is in A. Fib She is not a good candidate for anticoagulation despite elevated CHADS2 score as HAs-bled scre=3=5.6 % risk bleeding--discussed implications with family daughter at the bedside  DM TY 2 A1c 6.1 Goals of glycemic control should be above 8 A1c as higher mortality stringently controlled-DC sliding scale, give Lantus 5 units daily and only check sugars 4 times daily AC at bedtime until discharge  CAD, CABG Continue Imdur 30, metoprolol 50 twice daily Lipitor 40 daily--amlodipine is on hold at this time  Recurrent TIA/CVA Continue aspirin 81 and Lipitor 44 secondary prevention-not a candidate once again for anticoagulation  Hypokalemia Continue K. Dur 10 units daily  Graves' disease with resulting TSH 3.4 Continue methimazole 5 mg Monday Wednesday Friday  Dysphagia  Patient is DNR-continue Lovenox-I discussed with the daughter [name] at the bedside who is aware of patient's multiple comorbidities-she has had outpatient discussions with Dr. Larose Kells with regards to goals of care and is interested in palliative following at home as this was actually supposed to happen last admission but ironically she was in the hospital at that time I will engage case manager to assist with the same  S: 80 black female multiple CVA P A. fib [place] >4/SSS HFpEF CAD CABG X1-RCA + AOV replacement 1998 HLD DM TY 2 reflux hypothyroid.  Had actively bleeding vascular malformation cecum status post endoscopic  hemostatic therapy 2015 after switching Coumadin to Eliquis placed on ASA subsequently had TIA 2018 and placed back on Eliquis  Recent admit 08/24/2019-08/30/2018 as OBs/chest pain found to have acute diastolic heart failure given Lasix-also a GI bleed (hemoglobin 6.7-->8.1) early 3/1-08/10/18/2021 stopped Plavix/Eliquis at that time-plan was for conservative management 08/10/2019 and if persistent bleeding could consider EGD noted that at that time she was not a good candidate for bowel prep colonoscopy Returns to hospital [id number] acute diastolic heart failure   She is pleasant alert but very hard of hearing this morning in no distress It does not seem like she has any issues and has passed stool according to daughter O/e BP (!) 160/64   Pulse 84   Temp 98.4 F (36.9 C) (Oral)   Resp 18   Ht 5\' 3"  (1.6 m)   Wt 77 kg   SpO2 93%   BMI 30.07 kg/m  CO2 37 chloride 94 BUN/creatinine 10/0.9 glucose 121  Awake pleasant alert oriented Mallampati 3 no JVD S1-S2 no murmur rub or gallop telemetry shows A. fib rate controlled, mild lower extremity edema she has a pure wick in place abdomen is patulous soft nontender no rebound no guarding neurologically somewhat intact but quite hard of hearing

## 2019-09-15 NOTE — TOC Initial Note (Signed)
Transition of Care [facility]) - Initial/Assessment Note    Patient Details  Name: Rebecca Skinner MRN: [id number] Date of Birth: [birth year]  [facility] CM/SW Contact:    Bethena Roys, RN Phone Number: 09/15/2019, 2:12 PM  Clinical Narrative:  Patient presented for shortness of breath. Prior to arrival patient was from home with the support of her daughter [name]. Case Manager spoke with [name] regarding plan of care; which is to return home. Patient is currently active with [facility] for Registered Nurse, Physical and Occupational Therapies. Patient will need resumption orders and F2F once stable to transition home. Patient has PCP Dr. Larose Kells - and she gets to appointments without any problems. Patient's daughter gets medications [facility] [place]. Patient currently has durable medical equipment Rollator in the home. Case Manager received consult for outpatient palliative services- Patient is currently active with [facility]- visit was not able to happen due to patient's hospitalization. Case Manager did call [facility] Liaison to make them aware of services needed post hospitalization. Case Manager made a referral to [facility] for community resources. Daughter asked about an Aide in the home. Case Manager did speak with [facility] Liaison to make her aware of the request. Case Manager will continue to follow for additional transition of care needs.   Expected Discharge Plan: [place] Barriers to Discharge: Continued Medical Work up   Patient Goals and CMS Choice Patient states their goals for this hospitalization and ongoing recovery are:: return home   Choice offered to / list presented to : NA(Patient is currently active with [facility]- will need resumption orders.)  Expected Discharge Plan and Services Expected Discharge Plan: [place] In-house Referral: [facility], Hospice / Palliative Care(call placed to daughter regarding  outpatient palliative services.) Discharge Planning Services: CM Consult Post Acute Care Choice: Home Health, Resumption of Svcs/PTA Provider Living arrangements for the past 2 months: Single Family Home                 DME Arranged: N/A         HH Arranged: RN, Disease Management, PT, OT Bernie Agency: [place] ([facility]) Date HH Agency Contacted: 09/15/19 Time HH Agency Contacted: 1200 Representative spoke with at [place]: [name]  Prior Living Arrangements/Services Living arrangements for the past 2 months: [place] Lives with:: Adult Children Patient language and need for interpreter reviewed:: Yes Do you feel safe going back to the place where you live?: Yes      Need for Family Participation in Patient Care: Yes (Comment) Care giver support system in place?: Yes (comment) Current home services: DME(Patient has rollator.) Criminal Activity/Legal Involvement Pertinent to Current Situation/Hospitalization: No - Comment as needed  Activities of Daily Living      Permission Sought/Granted Permission sought to share information with : Family Supports, Chartered certified accountant granted to share information with : Yes, Verbal Permission Granted  Share Information with NAME: Daughter [name]  Permission granted to share info w AGENCY: [name]        Emotional Assessment Appearance:: Appears stated age Attitude/Demeanor/Rapport: Engaged Affect (typically observed): Appropriate Orientation: : Oriented to Place, Oriented to Self Alcohol / Substance Use: Not Applicable Psych Involvement: No (comment)  Admission diagnosis:  Shortness of breath [R06.02] CHF exacerbation ([place]) [I50.9] Patient Active Problem List   Diagnosis Date Noted  . Acute on chronic diastolic CHF (congestive heart failure) ([place]) 09/14/2019  . CHF exacerbation ([place]) 09/13/2019  .  Acute exacerbation of CHF (congestive heart failure) ([place]) 08/24/2019  . DNR (do not  resuscitate) 08/24/2019  . Melena 08/09/2019  . Stroke-like symptom   . Hypokalemia 05/22/2019  . Chest pain 06/21/2017  . TIA (transient ischemic attack) 08/30/2016  . Acute kidney injury ([place]) 03/05/2016  . Exposure keratitis 09/23/2015  . Keratopathy, band 09/13/2015  . PCP NOTES >>>>> 04/05/2015  . Chronic diastolic heart failure ([place]) 12/29/2014  . Edema 12/20/2014  . Dyspnea 12/20/2014  . Angiectasia 03/30/2014  . GI bleed 03/28/2014  . Dementia with behavioral disturbance ([place]) 09/16/2013  . Annual physical exam 11/21/2011  . Macrocytic anemia 07/17/2010  . ATRIAL FIBRILLATION, chronic 03/05/2007  . Diabetes mellitus with circulatory complication ([place]) [id number]  . S/P AVR (aortic valve replacement) 10/15/2006  . Hyperthyroidism 09/03/2006  . Dyslipidemia 09/03/2006  . HTN (hypertension) 09/03/2006  . CAD (coronary artery disease) of artery bypass graft 09/03/2006  . Asthma, chronic 09/03/2006   PCP:  Colon Branch, MD Pharmacy:   [id number] , [place] [address] Phone: [phone] Fax: [phone]     Social Determinants of Health (SDOH) Interventions    Readmission Risk Interventions Readmission Risk Prevention Plan 09/15/2019  Transportation Screening Complete  Medication Review ([place]) Complete  PCP or Specialist appointment within 3-5 days of discharge Complete  HRI or Home Care Consult Complete  SW Recovery Care/Counseling Consult Complete  Palliative Care Screening Not [place] Complete  Some recent data might be hidden

## 2019-09-16 DIAGNOSIS — R0602 Shortness of breath: Secondary | ICD-10-CM | POA: Diagnosis not present

## 2019-09-16 DIAGNOSIS — R0600 Dyspnea, unspecified: Secondary | ICD-10-CM

## 2019-09-16 LAB — CBC WITH DIFFERENTIAL/PLATELET
Abs Immature Granulocytes: 0.03 10*3/uL (ref 0.00–0.07)
Basophils Absolute: 0 10*3/uL (ref 0.0–0.1)
Basophils Relative: 0 %
Eosinophils Absolute: 0.1 10*3/uL (ref 0.0–0.5)
Eosinophils Relative: 1 %
HCT: 39.8 % (ref 36.0–46.0)
Hemoglobin: 11.8 g/dL — ABNORMAL LOW (ref 12.0–15.0)
Immature Granulocytes: 0 %
Lymphocytes Relative: 19 %
Lymphs Abs: 1.3 10*3/uL (ref 0.7–4.0)
MCH: 28 pg (ref 26.0–34.0)
MCHC: 29.6 g/dL — ABNORMAL LOW (ref 30.0–36.0)
MCV: 94.3 fL (ref 80.0–100.0)
Monocytes Absolute: 1 10*3/uL (ref 0.1–1.0)
Monocytes Relative: 14 %
Neutro Abs: 4.6 10*3/uL (ref 1.7–7.7)
Neutrophils Relative %: 66 %
Platelets: 225 10*3/uL (ref 150–400)
RBC: 4.22 MIL/uL (ref 3.87–5.11)
RDW: 17.3 % — ABNORMAL HIGH (ref 11.5–15.5)
WBC: 7 10*3/uL (ref 4.0–10.5)
nRBC: 0 % (ref 0.0–0.2)

## 2019-09-16 LAB — GLUCOSE, CAPILLARY
Glucose-Capillary: 123 mg/dL — ABNORMAL HIGH (ref 70–99)
Glucose-Capillary: 124 mg/dL — ABNORMAL HIGH (ref 70–99)
Glucose-Capillary: 127 mg/dL — ABNORMAL HIGH (ref 70–99)
Glucose-Capillary: 127 mg/dL — ABNORMAL HIGH (ref 70–99)
Glucose-Capillary: 129 mg/dL — ABNORMAL HIGH (ref 70–99)
Glucose-Capillary: 138 mg/dL — ABNORMAL HIGH (ref 70–99)

## 2019-09-16 LAB — BASIC METABOLIC PANEL
Anion gap: 12 (ref 5–15)
BUN: 10 mg/dL (ref 8–23)
CO2: 34 mmol/L — ABNORMAL HIGH (ref 22–32)
Calcium: 8.9 mg/dL (ref 8.9–10.3)
Chloride: 94 mmol/L — ABNORMAL LOW (ref 98–111)
Creatinine, Ser: 0.81 mg/dL (ref 0.44–1.00)
GFR calc Af Amer: >60 mL/min (ref >60)
GFR calc non Af Amer: >60 mL/min (ref >60)
Glucose, Bld: 123 mg/dL — ABNORMAL HIGH (ref 70–99)
Potassium: 3.3 mmol/L — ABNORMAL LOW (ref 3.5–5.1)
Sodium: 140 mmol/L (ref 135–145)

## 2019-09-16 MED ORDER — METOPROLOL TARTRATE 50 MG PO TABS
75.0000 mg | ORAL_TABLET | Freq: Two times a day (BID) | ORAL | Status: DC
  Administered 2019-09-16 – 2019-09-17 (×3): 75 mg via ORAL
  Filled 2019-09-16 (×3): qty 1

## 2019-09-16 MED ORDER — POTASSIUM CHLORIDE CRYS ER 20 MEQ PO TBCR
40.0000 meq | EXTENDED_RELEASE_TABLET | Freq: Every day | ORAL | Status: DC
  Administered 2019-09-16 – 2019-09-17 (×2): 40 meq via ORAL
  Filled 2019-09-16 (×2): qty 2

## 2019-09-16 MED ORDER — FUROSEMIDE 40 MG PO TABS
40.0000 mg | ORAL_TABLET | Freq: Two times a day (BID) | ORAL | Status: DC
  Administered 2019-09-16 – 2019-09-17 (×2): 40 mg via ORAL
  Filled 2019-09-16 (×2): qty 1

## 2019-09-16 NOTE — Progress Notes (Addendum)
Subjective:   Pain in her neck is a little better, breathing is a little better.  Wants to go home  Objective:  Vitals:   09/15/19 1709 09/15/19 1954 09/16/19 0430 09/16/19 0742  BP: (!) 159/84 (!) 159/88 (!) 159/86   Pulse: 86 88 86 99  Resp:  20 20 16   Temp: 99.1 F (37.3 C) 99.1 F (37.3 C) 98.2 F (36.8 C)   TempSrc: Oral Oral Oral   SpO2: 91% 92% 92% 95%  Weight:   77.2 kg   Height:       Wt Readings from Last 3 Encounters:  09/16/19 77.2 kg  09/09/19 79.8 kg  09/06/19 83.5 kg   Intake/Output from previous day:  Intake/Output Summary (Last 24 hours) at 09/16/2019 0812 Last data filed at 09/16/2019 0432 Gross per 24 hour  Intake 240 ml  Output 1400 ml  Net -1160 ml    Physical Exam:  GEN: No acute distress.   Neck:  Minimal JVD Cardiac:  Irregular rate and rhythm, 2/6 murmur, no rubs, or gallops.  Respiratory: diminished to auscultation bilaterally with few rales in the bases. GI: Soft, nontender, non-distended  MS: No edema; No new deformity. Neuro:  Nonfocal  Psych: Normal affect, becomes anxious easily    Lab Results: Basic Metabolic Panel: Recent Labs    09/14/19 0304 09/14/19 0304 09/15/19 0906 09/16/19 0541  NA 141   < > 141 140  K 3.5   < > 4.4 3.3*  CL 99   < > 94* 94*  CO2 34*   < > 37* 34*  GLUCOSE 90   < > 121* 123*  BUN 11   < > 10 10  CREATININE 0.91   < > 0.97 0.81  CALCIUM 8.6*   < > 9.0 8.9  MG 1.7  --   --   --   PHOS 3.8  --   --   --    < > = values in this interval not displayed.   Liver Function Tests: Recent Labs    09/14/19 0304  AST 25  ALT 17  ALKPHOS 78  BILITOT 0.4  PROT 5.7*  ALBUMIN 2.8*   No results for input(s): LIPASE, AMYLASE in the last 72 hours. CBC: Recent Labs    09/14/19 0304 09/16/19 0541  WBC 6.1 7.0  NEUTROABS  --  4.6  HGB 9.1* 11.8*  HCT 31.5* 39.8  MCV 97.2 94.3  PLT 193 225   Hemoglobin A1C: Recent Labs    09/14/19 0304  HGBA1C 6.1*  Thyroid Function Tests: Recent Labs      09/14/19 0304  TSH 3.430    Imaging: DG Chest Port 1V same Day  Result Date: 09/15/2019 CLINICAL DATA:  Shortness of breath, history of CHF. EXAM: PORTABLE CHEST 1 VIEW COMPARISON:  09/13/2019 chest radiograph. FINDINGS: Mildly hypoinflated lungs. Diffuse interstitial prominence and mild pulmonary vascular congestion, less conspicuous than prior exam. Grossly unchanged left predominant basilar/retrocardiac opacities. No pneumothorax. Small left pleural effusion, unchanged. Cardiomegaly. Sequela of CABG and valvular prosthesis. Multilevel spondylosis. IMPRESSION: Left basilar/retrocardiac opacities and small left pleural effusion are unchanged. Decreased conspicuity of background pulmonary edema. Cardiomegaly, unchanged. Electronically Signed   By: Primitivo Gauze M.D.   On: 09/15/2019 09:59    Cardiac Studies:  ECG: afib rate 72 nonspecific ST/T wave changes    Telemetry: Atrial fib, rate trending up a little this morning but generally well controlled.  Frequent PVCs, no significant pauses 09/16/2019   Echo:  08/25/19 EF 60-65% normal functioning tissue AVR   Medications:   . aspirin EC  81 mg Oral Daily  . atorvastatin  40 mg Oral q1800  . budesonide  0.5 mg Nebulization Daily  . enoxaparin (LOVENOX) injection  40 mg Subcutaneous Q24H  . furosemide  40 mg Intravenous BID  . insulin glargine  5 Units Subcutaneous Daily  . isosorbide mononitrate  30 mg Oral Daily  . methimazole  5 mg Oral Q M,W,F  . metoprolol tartrate  50 mg Oral BID  . pantoprazole  40 mg Oral Daily  . potassium chloride  40 mEq Oral Daily  . sodium chloride flush  3 mL Intravenous Q12H     . sodium chloride      Assessment/Plan:  1. Acute on chronic diastolic heart failure with moderate to severe pulmonary hypertension: -Weight stable from yesterday, but down 5 kg from admission, chest x-ray is improved - BUN/creatinine stable, supplement potassium -Chloride is low but stable -We are getting close to  dry weight, MD advise timing of changing to p.o. Lasix   2. Permanent atrial fibrillation: -Continue beta-blocker at current dose, rate generally controlled -No anticoagulation due to age and history of GI bleed -TSH okay, on methimazole   3.  CAD s/p CABG: -No ischemic symptoms, on aspirin, beta-blocker, statin, Imdur   4. Bioprosthetic AVR: -Stable on last echo   5. History of multiple CVAs: -On ASA, statin   6. Hypertension: -SBP 150s-160s in the last 24 hours -On Lasix and metoprolol 50 mg twice daily -She does not have any long pauses in her heart rate is generally greater than 75, increase metoprolol to 75 mg twice daily   7.  History of GI bleed: -H&H improving  8.: Pulmonary:  -Minimal wheeze today, chest x-ray slightly improved  9. Torticollis:  -Per IM  Chesney Suares 09/16/2019, 8:12 AM

## 2019-09-16 NOTE — Plan of Care (Signed)
  Problem: Nutrition: Goal: Adequate nutrition will be maintained Outcome: Progressing   Problem: Coping: Goal: Level of anxiety will decrease Outcome: Progressing   Problem: Pain Managment: Goal: General experience of comfort will improve Outcome: Progressing   Problem: Safety: Goal: Ability to remain free from injury will improve Outcome: Progressing   Problem: Education: Goal: Knowledge of General Education information will improve Description: Including pain rating scale, medication(s)/side effects and non-pharmacologic comfort measures Outcome: Not Progressing  Pt continues to be confused to time and situation

## 2019-09-16 NOTE — Progress Notes (Signed)
  Speech Language Pathology Treatment: Dysphagia  Patient Details Name: Rebecca Skinner MRN: [id number] DOB: 1922/11/27 Today's Date: 09/16/2019 Time: 7782-4235 SLP Time Calculation (min) (ACUTE ONLY): 12 min  Assessment / Plan / Recommendation Clinical Impression  Pt was seen for dysphagia treatment and was cooperative throughout the session. Pt, and nursing reported that the pt has been tolerating the current diet without overt s/sx of aspiration. However, nursing reported that she has not been eating much. Pt tolerated mixed consistency boluses (mechanical soft solids with thin liquids), regular texture solids, and thin liquids via straw using individual and consecutive swallows without symptoms of oropharyngeal dysphagia. It is recommended that the current diet be continued. Further skilled SLP services are not clinically indicated at this time.    HPI HPI: Pt is a 86 y.o. female with medical history significant of multiple CVA, PAF on Eliquis,chronic diastolic CHF,CAD s/pCABGx1 SVG to RCAand aortic valve replacement1998,HTN, HLD,DM 2,GERDGERD,dementia, andhypothyroidism,sick sinus syndrome who presented with worsening shortness of breath and leg edema. CXR 4/7: Left basilar/retrocardiac opacities and small left pleural effusion are unchanged. BSE in 2017: functional oropharyngeal swallow with no overt indication of aspiration. CXR 4/7: Left basilar/retrocardiac opacities and small left pleural effusion. decresed pulmonary edema      SLP Plan  Discharge SLP treatment due to (comment);All goals met       Recommendations  Diet recommendations: Regular;Thin liquid Liquids provided via: Cup;Straw Medication Administration: Whole meds with puree Supervision: Staff to assist with self feeding Compensations: Minimize environmental distractions                Oral Care Recommendations: Oral care BID Follow up Recommendations: None SLP Visit Diagnosis: Dysphagia, unspecified  (R13.10) Plan: Discharge SLP treatment due to (comment);All goals met       Zakeria Kulzer I. Hardin Negus, [place], [place] Office number [phone] Pager [place] 09/16/2019, 4:12 PM

## 2019-09-16 NOTE — Progress Notes (Signed)
P Acute superimposed HFpEF,  moderate to severe tricuspid regurg with RV dysfunction Prior aortic valve replacement 1998 Lasix IV-->40 p.o. twice daily 4/8, -3.9 L CXR from this morning small pleural effusion cardiomegaly  Hypokalemia, metabolic alkalosis Replace with higher dose of 40 mg daily CO2 is improved from 37-34  Sick sinus syndrome A. fib CHADS2 score >4--remains rate controlled A. fib Poor candidate anticoagulation despite elevated CHADS2 score as HAs-bled scre=3=5.6 % risk bleeding--daughter understands high risk of bleed  DM TY 2 A1c 6.1 Goals A1c 8 A1c--DC sliding scale, give Lantus 5 units daily and only check sugars 4 times daily AC at bedtime until discharge  CAD, CABG Continue Imdur 30, metoprolol 50 twice daily Lipitor 40 daily--amlodipine is on hold at this time  Recurrent TIA/CVA Continue aspirin 81 and Lipitor 44 secondary prevention-not a candidate once again for anticoagulation  Hypokalemia Continue K. Dur 10 units daily  Graves' disease with resulting TSH 3.4 Continue methimazole 5 mg Monday Wednesday Friday  Dysphagia  Patient is DNR-continue Lovenox-Long discussion with daughter 4/7 Disposition to home with palliative care in 24 hours if the creatinine remains stable and volume status is negative  S: 76 black female multiple CVA P A. fib [place] >4/SSS HFpEF CAD CABG X1-RCA + AOV replacement 1998 HLD DM TY 2 reflux Graves' disease on methimazole with resulting hypothyroid.  vascular malformation cecum status post endoscopic hemostatic therapy 2015 after switching Coumadin to Eliquis placed on ASA subsequently had TIA 2018 and placed back on Eliquis  Recent admit 08/24/2019-08/30/2018 as OBs/chest pain found to have acute diastolic heart failure given Lasix-also a GI bleed (hemoglobin 6.7-->8.1) early 3/1-08/10/18/2021 stopped Plavix/Eliquis at that time-plan was for conservative management 08/10/2019 and if persistent bleeding could consider EGD noted that at  that time she was not a good candidate for bowel prep colonoscopy Returns to hospital [id number] acute diastolic heart failure   Doing well this morning but extremely hard of hearing once again and may be some deficit with cognition at times when I ask her questions No fever no chills no nausea no vomiting no chest pain says her lower extremities hurt but does not have any shortness of breath O/e BP 134/63   Pulse (!) 103   Temp 98.2 F (36.8 C) (Oral)   Resp 16   Ht 5\' 3"  (1.6 m)   Wt 77.2 kg   SpO2 95%   BMI 30.15 kg/m  Potassium 3.3 CO2 34 BUN/creatinine 10/0.8 Hemoglobin 11.8 platelet 225 CBG ranging 120s to 150s  Awake pleasant alert oriented Mallampati 3 cannot appreciate JVD at 30 degrees S1-S2 no murmur rub or gallop telemetry shows A. fib rate controlled, Her edema looks a little better somewhat intact but quite hard of hearing Neurologically moving all 4 limbs without deficit

## 2019-09-17 ENCOUNTER — Telehealth: Payer: Self-pay

## 2019-09-17 ENCOUNTER — Other Ambulatory Visit: Payer: Self-pay | Admitting: Cardiovascular Disease

## 2019-09-17 DIAGNOSIS — R0602 Shortness of breath: Secondary | ICD-10-CM | POA: Diagnosis not present

## 2019-09-17 DIAGNOSIS — I482 Chronic atrial fibrillation, unspecified: Secondary | ICD-10-CM

## 2019-09-17 LAB — BASIC METABOLIC PANEL
Anion gap: 13 (ref 5–15)
BUN: 10 mg/dL (ref 8–23)
CO2: 33 mmol/L — ABNORMAL HIGH (ref 22–32)
Calcium: 9.2 mg/dL (ref 8.9–10.3)
Chloride: 92 mmol/L — ABNORMAL LOW (ref 98–111)
Creatinine, Ser: 0.92 mg/dL (ref 0.44–1.00)
GFR calc Af Amer: >60 mL/min (ref >60)
GFR calc non Af Amer: 53 mL/min — ABNORMAL LOW (ref >60)
Glucose, Bld: 135 mg/dL — ABNORMAL HIGH (ref 70–99)
Potassium: 4.1 mmol/L (ref 3.5–5.1)
Sodium: 138 mmol/L (ref 135–145)

## 2019-09-17 LAB — GLUCOSE, CAPILLARY
Glucose-Capillary: 113 mg/dL — ABNORMAL HIGH (ref 70–99)
Glucose-Capillary: 116 mg/dL — ABNORMAL HIGH (ref 70–99)
Glucose-Capillary: 117 mg/dL — ABNORMAL HIGH (ref 70–99)
Glucose-Capillary: 128 mg/dL — ABNORMAL HIGH (ref 70–99)

## 2019-09-17 LAB — CBC WITH DIFFERENTIAL/PLATELET
Abs Immature Granulocytes: 0.04 10*3/uL (ref 0.00–0.07)
Basophils Absolute: 0 10*3/uL (ref 0.0–0.1)
Basophils Relative: 0 %
Eosinophils Absolute: 0.1 10*3/uL (ref 0.0–0.5)
Eosinophils Relative: 1 %
HCT: 38.9 % (ref 36.0–46.0)
Hemoglobin: 11.8 g/dL — ABNORMAL LOW (ref 12.0–15.0)
Immature Granulocytes: 0 %
Lymphocytes Relative: 16 %
Lymphs Abs: 1.4 10*3/uL (ref 0.7–4.0)
MCH: 28.2 pg (ref 26.0–34.0)
MCHC: 30.3 g/dL (ref 30.0–36.0)
MCV: 93.1 fL (ref 80.0–100.0)
Monocytes Absolute: 1.2 10*3/uL — ABNORMAL HIGH (ref 0.1–1.0)
Monocytes Relative: 13 %
Neutro Abs: 6.4 10*3/uL (ref 1.7–7.7)
Neutrophils Relative %: 70 %
Platelets: 258 10*3/uL (ref 150–400)
RBC: 4.18 MIL/uL (ref 3.87–5.11)
RDW: 17.2 % — ABNORMAL HIGH (ref 11.5–15.5)
WBC: 9.1 10*3/uL (ref 4.0–10.5)
nRBC: 0.2 % (ref 0.0–0.2)

## 2019-09-17 MED ORDER — POTASSIUM CHLORIDE CRYS ER 20 MEQ PO TBCR: 20.0000 meq | EXTENDED_RELEASE_TABLET | Freq: Every day | ORAL | Status: DC

## 2019-09-17 MED ORDER — METOPROLOL TARTRATE 75 MG PO TABS: 75.0000 mg | ORAL_TABLET | Freq: Two times a day (BID) | ORAL | 3 refills | Status: DC

## 2019-09-17 MED ORDER — FUROSEMIDE 40 MG PO TABS: 40.0000 mg | ORAL_TABLET | Freq: Two times a day (BID) | ORAL | 1 refills | Status: AC

## 2019-09-17 NOTE — Progress Notes (Addendum)
Subjective:   Much more awake and alert than yesterday.  Daughter is present.  No complaints.  Objective:  Vitals:   09/16/19 0840 09/16/19 1445 09/16/19 2030 09/17/19 0425  BP: 134/63 140/69 136/86 (!) 151/71  Pulse: (!) 103 99 90 78  Resp:  19 18 20   Temp:  98.2 F (36.8 C) 98.2 F (36.8 C) 98.3 F (36.8 C)  TempSrc:  Oral Oral Oral  SpO2:  98% 98% 91%  Weight:    73.9 kg  Height:       Wt Readings from Last 3 Encounters:  09/17/19 73.9 kg  09/09/19 79.8 kg  09/06/19 83.5 kg   Intake/Output from previous day:  Intake/Output Summary (Last 24 hours) at 09/17/2019 0810 Last data filed at 09/17/2019 0300 Gross per 24 hour  Intake 243 ml  Output 2100 ml  Net -1857 ml    Physical Exam: GEN: No acute distress.   Neck: No JVD Cardiac:  Irregular rate and rhythm, 2/6 murmurs, rubs, or gallops.  Respiratory: diminished to auscultation bilaterally in the bases. GI: Soft, nontender, non-distended  MS: No edema; No deformity. Neuro:  Nonfocal  Psych: Normal affect   Lab Results: Basic Metabolic Panel: Recent Labs    09/16/19 0541 09/17/19 0336  NA 140 138  K 3.3* 4.1  CL 94* 92*  CO2 34* 33*  GLUCOSE 123* 135*  BUN 10 10  CREATININE 0.81 0.92  CALCIUM 8.9 9.2   Liver Function Tests: No results for input(s): AST, ALT, ALKPHOS, BILITOT, PROT, ALBUMIN in the last 72 hours. No results for input(s): LIPASE, AMYLASE in the last 72 hours. CBC: Recent Labs    09/16/19 0541 09/17/19 0336  WBC 7.0 9.1  NEUTROABS 4.6 6.4  HGB 11.8* 11.8*  HCT 39.8 38.9  MCV 94.3 93.1  PLT 225 258   Hemoglobin A1C: No results for input(s): HGBA1C in the last 72 hours.   Thyroid Function Tests: Last thyroid functions Lab Results  Component Value Date   TSH 3.430 09/14/2019   T3, Free  Date Value Ref Range Status  07/16/2017 3.2 2.3 - 4.2 pg/mL Final   Free T4  Date Value Ref Range Status  08/25/2019 0.83 0.61 - 1.12 ng/dL Final    Comment:    (NOTE) Biotin  ingestion may interfere with free T4 tests. If the results are inconsistent with the TSH level, previous test results, or the clinical presentation, then consider biotin interference. If needed, order repeat testing after stopping biotin. Performed at [facility], [place] [address] [place], [place] [address]     Imaging: DG Chest [place] 1V same Day  Result Date: 09/15/2019 CLINICAL DATA:  Shortness of breath, history of CHF. EXAM: PORTABLE CHEST 1 VIEW COMPARISON:  09/13/2019 chest radiograph. FINDINGS: Mildly hypoinflated lungs. Diffuse interstitial prominence and mild pulmonary vascular congestion, less conspicuous than prior exam. Grossly unchanged left predominant basilar/retrocardiac opacities. No pneumothorax. Small left pleural effusion, unchanged. Cardiomegaly. Sequela of CABG and valvular prosthesis. Multilevel spondylosis. IMPRESSION: Left basilar/retrocardiac opacities and small left pleural effusion are unchanged. Decreased conspicuity of background pulmonary edema. Cardiomegaly, unchanged. Electronically Signed   By: Primitivo Gauze M.D.   On: 09/15/2019 09:59    Cardiac Studies:  ECG: afib rate 72 nonspecific ST/T wave changes    Telemetry: atrial fib, rate controlled, PVCs and pairs 09/17/2019   Echo:  08/25/19 EF 60-65% normal functioning tissue AVR   Medications:    aspirin EC  81 mg Oral Daily   atorvastatin  40 mg Oral q1800   budesonide  0.5 mg Nebulization Daily   enoxaparin (LOVENOX) injection  40 mg Subcutaneous Q24H   furosemide  40 mg Oral BID   insulin glargine  5 Units Subcutaneous Daily   isosorbide mononitrate  30 mg Oral Daily   methimazole  5 mg Oral Q M,W,F   metoprolol tartrate  75 mg Oral BID   pantoprazole  40 mg Oral Daily   potassium chloride  40 mEq Oral Daily   sodium chloride flush  3 mL Intravenous Q12H      sodium chloride      Assessment/Plan:  1. Acute on chronic diastolic heart failure with moderate to severe pulmonary  hypertension: -Weight is down from yesterday, with feel that this is her dry weight -Continue Lasix 40 mg p.o. twice daily -Current potassium dose is 40 mEq daily, her potassium went from 3.3 up to 4.1 after 1 dose, will decrease to 20 mEq daily   2. Permanent atrial fibrillation: -She is tolerating metoprolol at 75 mg twice daily -Not an anticoagulation candidate -Continue methimazole, TSH is okay   3.  CAD s/p CABG: -No ischemic symptoms, continue current therapy  4. Bioprosthetic AVR: -Stable on recent echo   5. Hypertension: -Blood pressure control improved on current meds, continue  Torticollis and other issues:  -Per IM  Follow-up appointment has been arranged.  Rhonda Barrett 09/17/2019, 8:10 AM  Patient examined chart reviewed Exam with elderly obese black female Neck pain better Appears more euvolemic Afib rate well controlled On telemetry Continue current oral meds SEM with no AR normal functioning bioprosthetic AVR.    Cardiology will sign off

## 2019-09-17 NOTE — Plan of Care (Signed)
  Problem: Nutrition: Goal: Adequate nutrition will be maintained Outcome: Progressing   Problem: Coping: Goal: Level of anxiety will decrease Outcome: Progressing   Problem: Pain Managment: Goal: General experience of comfort will improve Outcome: Progressing   Problem: Safety: Goal: Ability to remain free from injury will improve Outcome: Progressing   Problem: Education: Goal: Knowledge of General Education information will improve Description: Including pain rating scale, medication(s)/side effects and non-pharmacologic comfort measures Outcome: Not Progressing  Pt continues to experience some confusion

## 2019-09-17 NOTE — TOC Transition Note (Signed)
Transition of Care [facility]) - CM/SW Discharge Note   Patient Details  Name: Rebecca Skinner MRN: [id number] Date of Birth: Nov 17, 1922  Transition of [facility]) CM/SW Contact:  Bartholomew Crews, RN Phone Number: [phone] 09/17/2019, 1:10 PM   Clinical Narrative:     Patient to transition home today. She will resume home health services with [facility] for RN, PT, OT. Advanced notified of discharge today. HH orders placed by MD for resumption of services.  Patient had referral for outpatient palliative prior to admission with [facility]. Notified liaison of discharge today.   Spoke with daughter, [name], on the phone. She is already on her way to the hospital to visit. Advised of home health and palliative services resuming.   No further TOC needs identified at this time.   Final next level of care: Home w Home Health Services Barriers to Discharge: No Barriers Identified   Patient Goals and CMS Choice Patient states their goals for this hospitalization and ongoing recovery are:: return home   Choice offered to / list presented to : NA(Patient is currently active with [facility]- will need resumption orders.)  Discharge Placement                       Discharge Plan and Services In-house Referral: [facility], Hospice / Palliative Care(call placed to daughter regarding outpatient palliative services.) Discharge Planning Services: CM Consult Post Acute Care Choice: Home Health, Resumption of Svcs/PTA Provider          DME Arranged: N/A         HH Arranged: RN, PT, OT [place] Agency: [place] ([place]) Date HH Agency Contacted: 09/17/19 Time [place]: 1309 Representative spoke with at [place]: [place] ([place]) Interventions     Readmission Risk Interventions Readmission Risk Prevention Plan 09/15/2019  Transportation Screening Complete  Medication Review Press photographer) Complete  PCP or Specialist appointment within 3-5 days  of discharge Complete  HRI or Home Care Consult Complete  SW Recovery Care/Counseling Consult Complete  [place] Complete  Some recent data might be hidden

## 2019-09-17 NOTE — Progress Notes (Signed)
RT NOTES: Pt found on room air, sats 85%. Placed patient on 2lpm nasal cannula. Sats now 93%.

## 2019-09-17 NOTE — Care Management Important Message (Signed)
Important Message  Patient Details  Name: Rebecca Skinner MRN: [id number] Date of Birth: Nov 13, 1922   Medicare Important Message Given:  Yes     Shelda Altes 09/17/2019, 8:22 AM

## 2019-09-17 NOTE — Progress Notes (Deleted)
Cardiology Office Note:    Date:  09/17/2019   ID:  Rebecca Skinner, DOB [birth year], MRN [id number]  PCP:  Colon Branch, MD  Cardiologist:  Shelva Majestic, MD   Referring MD: Colon Branch, MD   No chief complaint on file. ***  History of Present Illness:    Rebecca Skinner is a 86 y.o. female with a hx of paroxysmal atrial fibrillation on aspirin, no anticoagulation given her GI bleed history, CAD status post CABG x1 and bioprosthetic AVR in 1998, multiple CVAs, hypothyroidism, diabetes mellitus type 2, GERD, and dementia.  She had a GI bleed in 2015, CVA in 2015 in which Coumadin was switched to Eliquis, and an episode of TIA in 08/2016.  She was recently hospitalized in March 2021 with concerns for recurrent GI bleed and symptomatic anemia. Cardiology was consulted for SOB for which she was diuresed and discharged on PO lasix 40 mg for her diastolic heart failure. Echo with preserved EF. Amyloidosis was considered, but given her age and advancing dementia, she would not be a candidate for aggressive treatment. She was again hospitalized 09/2018 for acute on chronic diastolic heart failure, SOB and weight gain.  Pt was eating fast food several times per week.    Chronic diastolic heart failure Moderate to severe pulmonary hypertension - discharged on 40 mg lasix BID + 20 mEq potassium daily - will obtain BMP today   Permanent atrial fibrillation - not on anticoagulation given GI bleed   CAD s/p CABG  - continue ASA 81 mg and imdur 30 mg   Bioprosthetic AVR - stable on last echo   Hx of multiple CVAs - continue ASA and statin   Hypertension - amlodipine and BB   Hx of GI bleed - no aggressive invasive procedures given age and comorbid conditions      Past Medical History:  Diagnosis Date  . Anxiety 11/20/2011  . Asthma    UNDER [facility] DR PAZ  . CAD (coronary artery disease)    s/p CABG s/p AO valve replacement ([place] [facility])  . CVA (cerebral infarction) 08-2013   multiple, L, d/t Afib, started eloquis  . Diabetes mellitus    type 2   . Dizziness    Chronic, admiet 07-2010,saw neuro, thought to be a peripheral issue   . Dry skin   . GERD (gastroesophageal reflux disease)   . Hyperlipidemia   . Hypertension   . Hyperthyroidism   . Keloid    @ chest  . Memory loss   . Osteoarthritis   . Osteopenia    per dexa 12/09  . Paroxysmal atrial fibrillation ([place])    cards d/c coumadin 12/2008 d/t persisten NSR and frequent falls- restarted coumadin july 2011, now on Eliquis  . Recurrent UTI   . Shortness of breath dyspnea   . TIA (transient ischemic attack)     Past Surgical History:  Procedure Laterality Date  . ABDOMINAL HYSTERECTOMY    . AORTIC VALVE REPLACEMENT  1998  . CARDIAC VALVE REPLACEMENT    . CAROTID DOPPLER  07/13/12   BILATERAL BULB/PROXIMAL ICAS;MILD AMOUNT OF FIBROUS PLAQUE WITH NO DIAMETER REDUCTION.  . CESAREAN SECTION     x 2  . COLONOSCOPY WITH PROPOFOL N/A 03/30/2014   Procedure: COLONOSCOPY WITH PROPOFOL;  Surgeon: Irene Shipper, MD;  Location: [facility] ENDOSCOPY;  Service: Endoscopy;  Laterality: N/A;  . CORONARY ARTERY BYPASS GRAFT  1998   SVG TO RCA  . HEMORRHOID SURGERY    .  JOINT REPLACEMENT    . MYOCARDIAL PERFUSION STUDY  12/19/09   NORMAL PATTERN OF PERFUSION IN ALL REGIONS.EF 75%.  . OOPHORECTOMY    . TOTAL KNEE ARTHROPLASTY  1999  . TRANSTHORACIC ECHOCARDIOGRAM  09/11/11   LVEF >55%.STAGE 1 DIASTOLIC DYSFUNCTION,ELEVATED LV FILLING PRESSURE.BIOPROSTHETIC AORTIC VALVE-PEAK AND MEAN GRADIENTS OF 17 MMHG AND 8 MMHG.SIGMOID SEPTUM.MILD TO MOD MR.MILD TO MOD TR.RVSP 46 MMHG.    Current Medications: No outpatient medications have been marked as taking for the 09/20/19 encounter (Appointment) with Ledora Bottcher, [place].     Allergies:   Hydrocodone and Tramadol hcl   Social History   Socioeconomic History  . Marital status: Divorced    Spouse name: Not on file  . Number of children: 4  . Years of education: Not on  file  . Highest education level: Not on file  Occupational History  . Occupation: retired     Fish farm manager: RETIRED  Tobacco Use  . Smoking status: Former Research scientist (life sciences)  . Smokeless tobacco: Never Used  . Tobacco comment: quit 2004, smoked 1.5 ppd  Substance and Sexual Activity  . Alcohol use: No    Alcohol/week: 0.0 standard drinks  . Drug use: No  . Sexual activity: Not Currently  Other Topics Concern  . Not on file  Social History Narrative   Had to moved w/ [name]  ~ 01-2016   Had 3 daughter- 2 son  (lost oldest daughter and son), 2 living daughters in [place] Determinants of Health   Financial Resource Strain: Low Risk   . Difficulty of Paying Living Expenses: Not hard at all  Food Insecurity: No Food Insecurity  . Worried About Charity fundraiser in the Last Year: Never true  . Ran Out of Food in the Last Year: Never true  Transportation Needs: No Transportation Needs  . Lack of Transportation (Medical): No  . Lack of Transportation (Non-Medical): No  Physical Activity: Inactive  . Days of Exercise per Week: 0 days  . Minutes of Exercise per Session: 0 min  Stress:   . Feeling of Stress :   Social Connections:   . Frequency of Communication with Friends and Family:   . Frequency of Social Gatherings with Friends and Family:   . Attends Religious Services:   . Active Member of Clubs or Organizations:   . Attends Archivist Meetings:   Marland Kitchen Marital Status:      Family History: The patient's ***family history includes Coronary artery disease (age of onset: 35) in her son; Heart attack in her father; Hypertension in her brother; Stroke in her brother. There is no history of Colon cancer, Breast cancer, or Thyroid disease.  ROS:   Please see the history of present illness.    *** All other systems reviewed and are negative.  EKGs/Labs/Other Studies Reviewed:    The following studies were reviewed today: ***  EKG:  EKG is *** ordered today.  The ekg  ordered today demonstrates ***  Recent Labs: 09/13/2019: B Natriuretic Peptide 686.2 09/14/2019: ALT 17; Magnesium 1.7; TSH 3.430 09/17/2019: BUN 10; Creatinine, Ser 0.92; Hemoglobin 11.8; Platelets 258; Potassium 4.1; Sodium 138  Recent Lipid Panel    Component Value Date/Time   CHOL 97 05/23/2019 0514   TRIG 27 05/23/2019 0514   HDL 43 05/23/2019 0514   CHOLHDL 2.3 05/23/2019 0514   VLDL 5 05/23/2019 0514   LDLCALC 49 05/23/2019 0514    Physical Exam:    VS:  There were  no vitals taken for this visit.    Wt Readings from Last 3 Encounters:  09/17/19 162 lb 14.7 oz (73.9 kg)  09/09/19 176 lb (79.8 kg)  09/06/19 184 lb (83.5 kg)     GEN: *** Well nourished, well developed in no acute distress HEENT: Normal NECK: No JVD; No carotid bruits LYMPHATICS: No lymphadenopathy CARDIAC: ***RRR, no murmurs, rubs, gallops RESPIRATORY:  Clear to auscultation without rales, wheezing or rhonchi  ABDOMEN: Soft, non-tender, non-distended MUSCULOSKELETAL:  No edema; No deformity  SKIN: Warm and dry NEUROLOGIC:  Alert and oriented x 3 PSYCHIATRIC:  Normal affect   ASSESSMENT:    No diagnosis found. PLAN:    In order of problems listed above:  No diagnosis found.   Medication Adjustments/Labs and Tests Ordered: Current medicines are reviewed at length with the patient today.  Concerns regarding medicines are outlined above.  No orders of the defined types were placed in this encounter.  No orders of the defined types were placed in this encounter.   Signed, Ledora Bottcher, [place]  09/17/2019 10:35 AM    [facility]

## 2019-09-17 NOTE — Telephone Encounter (Signed)
MSW orders signed and faxed back to [facility] at [phone]. Form sent for scanning.

## 2019-09-17 NOTE — Discharge Summary (Addendum)
Physician Discharge Summary  Rebecca Skinner [id number] DOB: 14-Jan-1923 DOA: 09/13/2019  PCP: Colon Branch, MD  Admit date: 09/13/2019 Discharge date: 09/17/2019  Time spent: 35 minutes  Recommendations for Outpatient Follow-up:  1. Recommend no aggressive glycemic control given A1c in the 6 range and her advanced age 86. Note dosage changes of multiple meds inclusive of Lasix and adjustment of beta-blocker - formulation in addition to discontinuation of amlodipine 3. Needs Chem-7, CBC 1 to 2 weeks 4. Follow-up as an outpatient palliative care  Discharge Diagnoses:  Active Problems:   Hyperthyroidism   Diabetes mellitus with circulatory complication (HCC)   Dyslipidemia   HTN (hypertension)   CAD (coronary artery disease) of artery bypass graft   ATRIAL FIBRILLATION, chronic   Asthma, chronic   Dementia with behavioral disturbance (HCC)   GI bleed   Edema   CHF exacerbation (HCC)   Acute on chronic diastolic CHF (congestive heart failure) ([place])   Discharge Condition: Guarded  Diet recommendation: Regular  Filed Weights   09/15/19 0500 09/16/19 0430 09/17/19 0425  Weight: 77 kg 77.2 kg 73.9 kg    History of present illness:  69 black female multiple CVA P A. fib [place] >4/SSS HFpEF CAD CABG X1-RCA + AOV replacement 1998 HLD DM TY 2 reflux Graves' disease on methimazole with resulting hypothyroid.  vascular malformation cecum status post endoscopic hemostatic therapy 2015 after switching Coumadin to Eliquis placed on ASA subsequently had TIA 2018 and placed back on Eliquis  Recent admit 08/24/2019-08/30/2018 as OBs/chest pain found to have acute diastolic heart failure given Lasix-also a GI bleed (hemoglobin 6.7-->8.1) early 3/1-08/10/18/2021 stopped Plavix/Eliquis at that time-plan was for conservative management 08/10/2019 and if persistent bleeding could consider EGD noted that at that time she was not a good candidate for bowel prep colonoscopy Returns to hospital [id number] acute  diastolic heart failure  Hospital Course:  Acute superimposed HFpEF,  moderate to severe tricuspid regurg with RV dysfunction Prior aortic valve replacement 1998 Lasix IV-->40 p.o. twice daily 4/8, - 5.75040 is now on oral 40 twice daily Will need labs in a week CXR from this morning small pleural effusion cardiomegaly  Hypokalemia, metabolic alkalosis Replace with higher dose of 40 mg daily CO2 is improved from 37-34 Outpatient labs  Sick sinus syndrome A. fib CHADS2 score >4--remains rate controlled A. fib Poor candidate anticoagulation despite elevated CHADS2 score as HAs-bled scre=3=5.6 % risk bleeding--daughter understands high risk of bleed  DM TY 2 A1c 6.1 Goals A1c 8 A1c--DC sliding scale--was on Lantus 5 and I have discontinued completely  CAD, CABG Continue Imdur 30, Toprol changed to 75 bid formulation daily Lipitor 40 daily--amlodipine i discontinued this admission  Recurrent TIA/CVA Continue aspirin 81 and Lipitor 44 secondary prevention-not a candidate once again for anticoagulation  Hypokalemia Continue K. Dur 10 units daily  Graves' disease with resulting TSH 3.4 Continue methimazole 5 mg Monday Wednesday Friday  Dysphagia    Discharge Exam: Vitals:   09/17/19 0816 09/17/19 0905  BP:  126/62  Pulse:    Resp:  (!) 22  Temp:    SpO2: 93%    She appears a little bit more confused than her usual and somewhat more listless but cannot follow the conversation today She is not in overt cardiopulmonary distress Nursing reports no new issues either Ativan slight change in habitus   General: Awake pleasant coherent no distress EOMI NCAT no focal deficit Cardiovascular:  S1-S2 no murmur rub or gallop  Respiratory: No rales no rhonchi  clinically clear no added sound Abdomen soft no rebound no guarding  Discharge Instructions    Allergies as of 09/17/2019      Reactions   Hydrocodone Itching, Nausea Only   Tramadol Hcl Itching, Nausea Only       Medication List    STOP taking these medications   amLODipine 10 MG tablet Commonly known as: NORVASC   amLODipine 5 MG tablet Commonly known as: NORVASC     TAKE these medications   acetaminophen 500 MG tablet Commonly known as: TYLENOL Take 1,000 mg by mouth every 6 (six) hours as needed (for pain).   albuterol 0.63 MG/3ML nebulizer solution Commonly known as: ACCUNEB Take 3 mLs by nebulization every 6 (six) hours as needed for wheezing or shortness of breath.   aspirin EC 81 MG tablet Take 1 tablet (81 mg total) by mouth daily.   atorvastatin 40 MG tablet Commonly known as: LIPITOR TAKE 1 TABLET BY MOUTH EVERYDAY AT BEDTIME What changed: See the new instructions.   bismuth subsalicylate 99991111 MG chewable tablet Commonly known as: PEPTO BISMOL Chew 524 mg by mouth as needed for indigestion or diarrhea or loose stools.   budesonide 0.5 MG/2ML nebulizer solution Commonly known as: PULMICORT Take 2 mLs (0.5 mg total) by nebulization daily.   Calcium 500 + D 500-125 MG-UNIT Tabs Generic drug: Calcium Carbonate-Vitamin D Take 1 tablet by mouth at bedtime.   esomeprazole 40 MG capsule Commonly known as: NEXIUM Take 1 capsule (40 mg total) by mouth daily as needed.   furosemide 40 MG tablet Commonly known as: LASIX Take 1 tablet (40 mg total) by mouth 2 (two) times daily. What changed: when to take this   isosorbide mononitrate 30 MG 24 hr tablet Commonly known as: IMDUR TAKE 1 TABLET BY MOUTH EVERY DAY   magnesium oxide 400 MG tablet Commonly known as: MAG-OX Take 1 tablet (400 mg total) by mouth daily.   methimazole 5 MG tablet Commonly known as: TAPAZOLE TAKE 1 TABLET BY MOUTH 3 TIMES A WEEK What changed: See the new instructions.   Metoprolol Tartrate 75 MG Tabs Take 75 mg by mouth 2 (two) times daily. What changed:   medication strength  how much to take   nitroGLYCERIN 0.4 MG SL tablet Commonly known as: NITROSTAT Place 1 tablet (0.4 mg total)  under the tongue every 5 (five) minutes x 3 doses as needed for chest pain.   potassium chloride 10 MEQ tablet Commonly known as: Klor-Con M10 Take 1 tablet (10 mEq total) by mouth daily.   QUEtiapine 25 MG tablet Commonly known as: SEROquel Take 1 tablet (25 mg total) by mouth at bedtime. What changed:   when to take this  reasons to take this      Allergies  Allergen Reactions  . Hydrocodone Itching and Nausea Only  . Tramadol Hcl Itching and Nausea Only      The results of significant diagnostics from this hospitalization (including imaging, microbiology, ancillary and laboratory) are listed below for reference.    Significant Diagnostic Studies: DG Chest 2 View  Result Date: 09/13/2019 CLINICAL DATA:  86 year old female with shortness of breath. EXAM: CHEST - 2 VIEW COMPARISON:  Chest radiograph dated 08/24/2019. FINDINGS: There is cardiomegaly with mild vascular congestion and small bilateral pleural effusions relatively similar to prior radiograph. Bibasilar, left greater right atelectasis although infiltrate is not excluded. Clinical correlation is recommended. No pneumothorax. There is atherosclerotic calcification of the aorta. Median sternotomy wires and postsurgical changes of CABG  and heart valve replacement. Degenerative changes of the spine. No acute osseous pathology. IMPRESSION: Cardiomegaly with findings of CHF and small bilateral pleural effusions. Superimposed pneumonia is not excluded. Overall slight interval improvement in the aeration of the lungs since the radiograph of 08/24/2019. Electronically Signed   By: Anner Crete M.D.   On: 09/13/2019 16:22   DG Chest Portable 1 View  Result Date: 08/24/2019 CLINICAL DATA:  Shortness of breath. EXAM: PORTABLE CHEST 1 VIEW COMPARISON:  08/09/2019 FINDINGS: Right and left hemidiaphragm are obscured greater on the left than the right. Signs of median sternotomy and CABG with aortic valve replacement with stable  cardiomediastinal contour enlargement. Linear areas of airspace disease as developed since the prior study in the right mid chest and in the left mid chest. Graded opacity at the right and left lung base. No significant skeletal findings aside from median sternotomy. IMPRESSION: Signs of bilateral effusions and basilar airspace disease with interval development of areas of atelectatic change in the mid chest and with worsening opacification in the lung bases particularly in the retrocardiac region. Electronically Signed   By: Zetta Bills M.D.   On: 08/24/2019 10:31   DG Chest Port 1V same Day  Result Date: 09/15/2019 CLINICAL DATA:  Shortness of breath, history of CHF. EXAM: PORTABLE CHEST 1 VIEW COMPARISON:  09/13/2019 chest radiograph. FINDINGS: Mildly hypoinflated lungs. Diffuse interstitial prominence and mild pulmonary vascular congestion, less conspicuous than prior exam. Grossly unchanged left predominant basilar/retrocardiac opacities. No pneumothorax. Small left pleural effusion, unchanged. Cardiomegaly. Sequela of CABG and valvular prosthesis. Multilevel spondylosis. IMPRESSION: Left basilar/retrocardiac opacities and small left pleural effusion are unchanged. Decreased conspicuity of background pulmonary edema. Cardiomegaly, unchanged. Electronically Signed   By: Primitivo Gauze M.D.   On: 09/15/2019 09:59   ECHOCARDIOGRAM COMPLETE  Result Date: 08/25/2019    ECHOCARDIOGRAM REPORT   Patient Name:   SWETA MAIDA Date of Exam: 08/25/2019 Medical Rec #:  [id number]   Height:       62.0 in Accession #:    [id number]  Weight:       182.3 lb Date of Birth:  1922-10-08    BSA:          1.838 m Patient Age:    60 years    BP:           155/63 mmHg Patient Gender: F           HR:           65 bpm. Exam Location:  Inpatient Procedure: 2D Echo and Intracardiac Opacification Agent Indications:    CHF-Acute Diastolic [id number] / [id number]  History:        Patient has prior history of Echocardiogram examinations,  most                 recent 01/27/2019. CAD, Pacemaker and Prior CABG, TIA and Stroke,                 Arrythmias:Atrial Fibrillation and sick sinus syndrome; Risk                 Factors:Hypertension, Dyslipidemia and Diabetes. GERD. Dementia.                 Aortic Valve: unknown pericardial valve is present in the aortic                 position. Procedure Date: 1998.  Sonographer:    Darlina Sicilian RDCS Referring Phys: [id number] [place]  Sonographer Comments: Definity attempted, nevery returned to the heart, nurse made aware. IMPRESSIONS  1. Left ventricular ejection fraction, by estimation, is 60 to 65%. The left ventricle has normal function. The left ventricle has no regional wall motion abnormalities. There is moderate left ventricular hypertrophy. Left ventricular diastolic parameters are indeterminate. There is the interventricular septum is flattened in systole and diastole, consistent with right ventricular pressure and volume overload.  2. Right ventricular systolic function is mildly reduced. The right ventricular size is severely enlarged. There is moderately elevated pulmonary artery systolic pressure. The estimated right ventricular systolic pressure is 123456 mmHg.  3. Left atrial size was mildly dilated.  4. Right atrial size was severely dilated.  5. The mitral valve is normal in structure. Trivial mitral valve regurgitation.  6. Tricuspid valve regurgitation is moderate to severe.  7. The aortic valve has been repaired/replaced. Aortic valve regurgitation is trivial. There is a bioprosthetic valve present in the aortic position. Normal bioprosthetic function. Mean gradient 71mmHg, EOA 2.1 cm^2, DI 0.62  8. Aortic dilatation noted. There is mild dilatation of the ascending aorta measuring 37 mm.  9. The inferior vena cava is dilated in size with <50% respiratory variability, suggesting right atrial pressure of 15 mmHg. FINDINGS  Left Ventricle: Left ventricular ejection fraction, by estimation,  is 60 to 65%. The left ventricle has normal function. The left ventricle has no regional wall motion abnormalities. Definity contrast agent was given IV to delineate the left ventricular  endocardial borders. The left ventricular internal cavity size was small. There is moderate left ventricular hypertrophy. The interventricular septum is flattened in systole and diastole, consistent with right ventricular pressure and volume overload. Left ventricular diastolic parameters are indeterminate. Right Ventricle: The right ventricular size is severely enlarged. Right vetricular wall thickness was not assessed. Right ventricular systolic function is mildly reduced. There is moderately elevated pulmonary artery systolic pressure. The tricuspid regurgitant velocity is 3.67 m/s, and with an assumed right atrial pressure of 15 mmHg, the estimated right ventricular systolic pressure is 123456 mmHg. Left Atrium: Left atrial size was mildly dilated. Right Atrium: Right atrial size was severely dilated. Pericardium: Trivial pericardial effusion is present. Mitral Valve: The mitral valve is normal in structure. Trivial mitral valve regurgitation. MV peak gradient, 13.5 mmHg. The mean mitral valve gradient is 4.5 mmHg. Tricuspid Valve: The tricuspid valve is normal in structure. Tricuspid valve regurgitation is moderate to severe. Aortic Valve: The aortic valve has been repaired/replaced. Aortic valve regurgitation is trivial. Aortic valve mean gradient measures 9.0 mmHg. Aortic valve peak gradient measures 18.9 mmHg. Aortic valve area, by VTI measures 1.92 cm. There is a unknown  pericardial valve present in the aortic position. Procedure Date: 1998. Pulmonic Valve: The pulmonic valve was grossly normal. Pulmonic valve regurgitation is mild. Aorta: Aortic dilatation noted. There is mild dilatation of the ascending aorta measuring 37 mm. Venous: The inferior vena cava is dilated in size with less than 50% respiratory variability,  suggesting right atrial pressure of 15 mmHg. IAS/Shunts: The interatrial septum was not well visualized.  LEFT VENTRICLE PLAX 2D LVIDd:         3.30 cm LVIDs:         2.80 cm LV PW:         1.00 cm LV IVS:        1.20 cm LVOT diam:     1.97 cm LV SV:         77 LV SV Index:   42 LVOT  Area:     3.04 cm  LEFT ATRIUM           Index       RIGHT ATRIUM           Index LA diam:      3.70 cm 2.01 cm/m  RA Area:     31.30 cm LA Vol (A4C): 72.4 ml 39.39 ml/m RA Volume:   128.00 ml 69.64 ml/m  AORTIC VALVE AV Area (Vmax):    1.82 cm AV Area (Vmean):   1.69 cm AV Area (VTI):     1.92 cm AV Vmax:           217.50 cm/s AV Vmean:          141.500 cm/s AV VTI:            0.403 m AV Peak Grad:      18.9 mmHg AV Mean Grad:      9.0 mmHg LVOT Vmax:         130.00 cm/s LVOT Vmean:        78.800 cm/s LVOT VTI:          0.255 m LVOT/AV VTI ratio: 0.63  AORTA Ao Root diam: 3.00 cm Ao Asc diam:  3.70 cm MITRAL VALVE                TRICUSPID VALVE MV Area (PHT): 2.69 cm     TR Peak grad:   53.9 mmHg MV Peak grad:  13.5 mmHg    TR Vmax:        367.00 cm/s MV Mean grad:  4.5 mmHg MV Vmax:       1.84 m/s     SHUNTS MV Vmean:      97.6 cm/s    Systemic VTI:  0.26 m MV Decel Time: 282 msec     Systemic Diam: 1.97 cm MV E velocity: 173.50 cm/s Oswaldo Milian MD Electronically signed by Oswaldo Milian MD Signature Date/Time: 08/25/2019/1:29:11 PM    Final     Microbiology: Recent Results (from the past 240 hour(s))  SARS CORONAVIRUS 2 (TAT 6-24 HRS) Nasopharyngeal Nasopharyngeal Swab     Status: None   Collection Time: 09/13/19 10:41 PM   Specimen: Nasopharyngeal Swab  Result Value Ref Range Status   SARS Coronavirus 2 NEGATIVE NEGATIVE Final    Comment: (NOTE) SARS-CoV-2 target nucleic acids are NOT DETECTED. The SARS-CoV-2 RNA is generally detectable in upper and lower respiratory specimens during the acute phase of infection. Negative results do not preclude SARS-CoV-2 infection, do not rule  out co-infections with other pathogens, and should not be used as the sole basis for treatment or other patient management decisions. Negative results must be combined with clinical observations, patient history, and epidemiological information. The expected result is Negative. Fact Sheet for Patients: SugarRoll.be Fact Sheet for Healthcare Providers: https://www.woods-mathews.com/ This test is not yet approved or cleared by the [place] FDA and  has been authorized for detection and/or diagnosis of SARS-CoV-2 by FDA under an Emergency Use Authorization (EUA). This EUA will remain  in effect (meaning this test can be used) for the duration of the COVID-19 declaration under Section 56 4(b)(1) of the Act, 21 U.S.C. section 360bbb-3(b)(1), unless the authorization is terminated or revoked sooner. Performed at [facility], [place] [address], [place], [place] [address]      Labs: Basic Metabolic Panel: Recent Labs  Lab 09/13/19 1608 09/14/19 0304 09/15/19 [id number] 09/16/19 0541 09/17/19 0336  NA 140  141 141 140 138  K 4.1 3.5 4.4 3.3* 4.1  CL 99 99 94* 94* 92*  CO2 29 34* 37* 34* 33*  GLUCOSE 129* 90 121* 123* 135*  BUN 13 11 10 10 10   CREATININE 0.99 0.91 0.97 0.81 0.92  CALCIUM 8.7* 8.6* 9.0 8.9 9.2  MG  --  1.7  --   --   --   PHOS  --  3.8  --   --   --    Liver Function Tests: Recent Labs  Lab 09/14/19 0304  AST 25  ALT 17  ALKPHOS 78  BILITOT 0.4  PROT 5.7*  ALBUMIN 2.8*   No results for input(s): LIPASE, AMYLASE in the last 168 hours. No results for input(s): AMMONIA in the last 168 hours. CBC: Recent Labs  Lab 09/13/19 1608 09/14/19 0304 09/16/19 0541 09/17/19 0336  WBC 8.6 6.1 7.0 9.1  NEUTROABS  --   --  4.6 6.4  HGB 9.9* 9.1* 11.8* 11.8*  HCT 35.1* 31.5* 39.8 38.9  MCV 100.6* 97.2 94.3 93.1  PLT 276 193 225 258   Cardiac Enzymes: No results for input(s): CKTOTAL, CKMB, CKMBINDEX, TROPONINI in the  last 168 hours. BNP: BNP (last 3 results) Recent Labs    05/22/19 2200 08/24/19 0955 09/13/19 1608  BNP 698.3* 713.6* 686.2*    ProBNP (last 3 results) No results for input(s): PROBNP in the last 8760 hours.  CBG: Recent Labs  Lab 09/16/19 1608 09/16/19 2053 09/17/19 0025 09/17/19 0445 09/17/19 0826  GLUCAP 138* 127* 128* 116* 117*    Signed:  Nita Sells MD   [facility] 09/17/2019, 10:55 AM

## 2019-09-19 DIAGNOSIS — E1159 Type 2 diabetes mellitus with other circulatory complications: Secondary | ICD-10-CM | POA: Diagnosis not present

## 2019-09-19 DIAGNOSIS — J9601 Acute respiratory failure with hypoxia: Secondary | ICD-10-CM | POA: Diagnosis not present

## 2019-09-19 DIAGNOSIS — I5082 Biventricular heart failure: Secondary | ICD-10-CM | POA: Diagnosis not present

## 2019-09-19 DIAGNOSIS — E039 Hypothyroidism, unspecified: Secondary | ICD-10-CM | POA: Diagnosis not present

## 2019-09-19 DIAGNOSIS — I252 Old myocardial infarction: Secondary | ICD-10-CM | POA: Diagnosis not present

## 2019-09-19 DIAGNOSIS — I110 Hypertensive heart disease with heart failure: Secondary | ICD-10-CM | POA: Diagnosis not present

## 2019-09-19 DIAGNOSIS — I5033 Acute on chronic diastolic (congestive) heart failure: Secondary | ICD-10-CM | POA: Diagnosis not present

## 2019-09-19 DIAGNOSIS — E785 Hyperlipidemia, unspecified: Secondary | ICD-10-CM | POA: Diagnosis not present

## 2019-09-19 DIAGNOSIS — I2510 Atherosclerotic heart disease of native coronary artery without angina pectoris: Secondary | ICD-10-CM | POA: Diagnosis not present

## 2019-09-19 DIAGNOSIS — K922 Gastrointestinal hemorrhage, unspecified: Secondary | ICD-10-CM | POA: Diagnosis not present

## 2019-09-19 DIAGNOSIS — I480 Paroxysmal atrial fibrillation: Secondary | ICD-10-CM | POA: Diagnosis not present

## 2019-09-19 DIAGNOSIS — J449 Chronic obstructive pulmonary disease, unspecified: Secondary | ICD-10-CM | POA: Diagnosis not present

## 2019-09-19 DIAGNOSIS — I272 Pulmonary hypertension, unspecified: Secondary | ICD-10-CM | POA: Diagnosis not present

## 2019-09-20 ENCOUNTER — Telehealth: Payer: Self-pay | Admitting: Internal Medicine

## 2019-09-20 ENCOUNTER — Other Ambulatory Visit: Payer: Self-pay | Admitting: *Deleted

## 2019-09-20 ENCOUNTER — Ambulatory Visit: Payer: Medicare Other | Admitting: Physician Assistant

## 2019-09-20 NOTE — Telephone Encounter (Signed)
   Caller/Agency: [facility]   Callback Number: [phone] Requesting: Nursing Frequency: 3 time for 1 week  2 times for 1 week  1 time for 3 weeks   Requesting a verbal for nursing

## 2019-09-20 NOTE — Patient Outreach (Signed)
[facility]) Care Management  09/20/2019  KENADY PAULINO 1922-10-09 [id number]    Telephone Assessment Discharged 4/9 Primary provider office to completed transition of care.  RN spoke with pt's daughter [name] concerning pt's recent discharge from there hospital. [place] is involved with PT/OT/RN. Reports visit has taken place and all of the discharge orders and medications were reviewed and discussed. Caregiver opt to decline review of this information but verified pt is taking all the required medications and follow instructions as recommended. Reports pt has not weighed today but her sleeping late but will weigh once awake. RN further discussed the last conversation for possible enrollment into the [facility] program and services however pt was admitted into the hospital soon after that call. Caregiver sound overwhelmed as RN attempted to further engage with available assistance. Caregiver receptive for a follow up call from the involved social worker for available resources that maybe able to assist. Will contact office for social worker follow up Marcie Bal Caldwell-coverage). Will further engage on the requested PCS however daughter aware she may need to pay out of pocket for this services but continue to request available resources ([facility]-Social worker will address further).  RN continue to inquired I Dr. Larose Kells office has contact her for post-op follow up. Daughter indicates the office has not call. RN strongly encouraged daughter aware to contact Dr. Ethel Rana office with update on pt's recent discharged from the office as she needs to inquire on another issues related to pt's sleep medication (to strong even with a half dose).   Offered enrollment and entry into the CHF program with weekly or monthly follow up call along with assistance that maybe needed from a social worker or pharmacy related to medication administration. Pt very receptive and agreed to monthly follow up calls at this time.  RN generated a plan of care related to CHF and verified pt is in the GREEN zone with no related symptoms discussed today. Will send CHF information via EMMI educational printouts and discuss the HF zones and what to do if acute symptoms should occur. Will verify pt's cardiologist and teach-back on what has been discussed today for ongoing management of care. Will notify Dr. Larose Kells office of pt's participation in the [facility] program and services. RN will offer offer to follow up in a few weeks to further engage by completing the initial assessment. All goals and interventions were discussed along with the tools that will be send to further assist with this management of care. No other needs mentions to address at this time.  [facility] CM Care Plan Problem One     Most Recent Value  Care Plan Problem One  Deficient Knowledge related to CHF symtom Management.exacerbation  Role Documenting the Problem One  Care Management Telephonic Coordinator  Care Plan for Problem One  Active  [facility] Long Term Goal   Pt will verbalize the plan of action in the YELLOW zone within the next 90 days.  [facility] Long Term Goal Start Date  09/20/19  Interventions for Problem One Long Term Goal  Will discuss the CHF zones and what to do if acute symptoms should occur.  [facility] CM Short Term Goal #1   Pt will verbalize maintaining weight within 2 lbs of goal within the next 30 days.  [facility] CM Short Term Goal #1 Start Date  09/20/19  Interventions for Short Term Goal #1  Will send printable infromation on CHF via EMMI material and stress the importance of daily weights to prevent  fuild retention.  [facility] CM Short Term Goal #2   PT will keep all medical appointments scheduled within the next 30 days post of discharge  [facility] CM Short Term Goal #2 Start Date  09/20/19  Interventions for Short Term Goal #2  Will stress the importance of attending all medical appointments to prevent acute issues from occcuring.  Will verify sufficient transportation and offer  additional resources if needed.      Raina Mina, RN Care Management Coordinator [place] Office [phone]

## 2019-09-20 NOTE — Telephone Encounter (Signed)
Spoke w/ Donita- verbal orders given.  

## 2019-09-21 ENCOUNTER — Emergency Department (HOSPITAL_COMMUNITY): Payer: Medicare Other

## 2019-09-21 ENCOUNTER — Inpatient Hospital Stay (HOSPITAL_COMMUNITY)
Admission: EM | Admit: 2019-09-21 | Discharge: 2019-09-24 | DRG: 064 | Disposition: A | Discharge status: Hospice | Payer: Medicare Other | Attending: Internal Medicine | Admitting: Internal Medicine

## 2019-09-21 ENCOUNTER — Encounter (HOSPITAL_COMMUNITY): Payer: Self-pay

## 2019-09-21 ENCOUNTER — Other Ambulatory Visit: Payer: Self-pay

## 2019-09-21 ENCOUNTER — Inpatient Hospital Stay (HOSPITAL_COMMUNITY): Payer: Medicare Other

## 2019-09-21 DIAGNOSIS — F0391 Unspecified dementia with behavioral disturbance: Secondary | ICD-10-CM | POA: Diagnosis present

## 2019-09-21 DIAGNOSIS — Z8673 Personal history of transient ischemic attack (TIA), and cerebral infarction without residual deficits: Secondary | ICD-10-CM

## 2019-09-21 DIAGNOSIS — Z96659 Presence of unspecified artificial knee joint: Secondary | ICD-10-CM | POA: Diagnosis present

## 2019-09-21 DIAGNOSIS — Z20822 Contact with and (suspected) exposure to covid-19: Secondary | ICD-10-CM | POA: Diagnosis present

## 2019-09-21 DIAGNOSIS — I10 Essential (primary) hypertension: Secondary | ICD-10-CM | POA: Diagnosis present

## 2019-09-21 DIAGNOSIS — Z7982 Long term (current) use of aspirin: Secondary | ICD-10-CM

## 2019-09-21 DIAGNOSIS — E875 Hyperkalemia: Secondary | ICD-10-CM | POA: Diagnosis present

## 2019-09-21 DIAGNOSIS — I480 Paroxysmal atrial fibrillation: Secondary | ICD-10-CM | POA: Diagnosis present

## 2019-09-21 DIAGNOSIS — Z823 Family history of stroke: Secondary | ICD-10-CM

## 2019-09-21 DIAGNOSIS — Z6828 Body mass index (BMI) 28.0-28.9, adult: Secondary | ICD-10-CM

## 2019-09-21 DIAGNOSIS — Z66 Do not resuscitate: Secondary | ICD-10-CM | POA: Diagnosis not present

## 2019-09-21 DIAGNOSIS — Z7951 Long term (current) use of inhaled steroids: Secondary | ICD-10-CM

## 2019-09-21 DIAGNOSIS — I482 Chronic atrial fibrillation, unspecified: Secondary | ICD-10-CM | POA: Diagnosis present

## 2019-09-21 DIAGNOSIS — Z515 Encounter for palliative care: Secondary | ICD-10-CM | POA: Diagnosis not present

## 2019-09-21 DIAGNOSIS — Z951 Presence of aortocoronary bypass graft: Secondary | ICD-10-CM

## 2019-09-21 DIAGNOSIS — R220 Localized swelling, mass and lump, head: Secondary | ICD-10-CM | POA: Diagnosis not present

## 2019-09-21 DIAGNOSIS — E782 Mixed hyperlipidemia: Secondary | ICD-10-CM | POA: Diagnosis not present

## 2019-09-21 DIAGNOSIS — I5032 Chronic diastolic (congestive) heart failure: Secondary | ICD-10-CM | POA: Diagnosis present

## 2019-09-21 DIAGNOSIS — I2510 Atherosclerotic heart disease of native coronary artery without angina pectoris: Secondary | ICD-10-CM | POA: Diagnosis not present

## 2019-09-21 DIAGNOSIS — Z87891 Personal history of nicotine dependence: Secondary | ICD-10-CM

## 2019-09-21 DIAGNOSIS — R29710 NIHSS score 10: Secondary | ICD-10-CM | POA: Diagnosis not present

## 2019-09-21 DIAGNOSIS — K219 Gastro-esophageal reflux disease without esophagitis: Secondary | ICD-10-CM | POA: Diagnosis not present

## 2019-09-21 DIAGNOSIS — J9611 Chronic respiratory failure with hypoxia: Secondary | ICD-10-CM | POA: Diagnosis present

## 2019-09-21 DIAGNOSIS — Z8679 Personal history of other diseases of the circulatory system: Secondary | ICD-10-CM | POA: Diagnosis not present

## 2019-09-21 DIAGNOSIS — I110 Hypertensive heart disease with heart failure: Secondary | ICD-10-CM | POA: Diagnosis present

## 2019-09-21 DIAGNOSIS — R64 Cachexia: Secondary | ICD-10-CM | POA: Diagnosis present

## 2019-09-21 DIAGNOSIS — M2550 Pain in unspecified joint: Secondary | ICD-10-CM | POA: Diagnosis not present

## 2019-09-21 DIAGNOSIS — Z8249 Family history of ischemic heart disease and other diseases of the circulatory system: Secondary | ICD-10-CM | POA: Diagnosis not present

## 2019-09-21 DIAGNOSIS — I361 Nonrheumatic tricuspid (valve) insufficiency: Secondary | ICD-10-CM | POA: Diagnosis not present

## 2019-09-21 DIAGNOSIS — R404 Transient alteration of awareness: Secondary | ICD-10-CM | POA: Diagnosis not present

## 2019-09-21 DIAGNOSIS — I4891 Unspecified atrial fibrillation: Secondary | ICD-10-CM | POA: Diagnosis not present

## 2019-09-21 DIAGNOSIS — Z953 Presence of xenogenic heart valve: Secondary | ICD-10-CM

## 2019-09-21 DIAGNOSIS — G9341 Metabolic encephalopathy: Secondary | ICD-10-CM | POA: Diagnosis not present

## 2019-09-21 DIAGNOSIS — I639 Cerebral infarction, unspecified: Secondary | ICD-10-CM | POA: Diagnosis not present

## 2019-09-21 DIAGNOSIS — Z885 Allergy status to narcotic agent status: Secondary | ICD-10-CM

## 2019-09-21 DIAGNOSIS — R471 Dysarthria and anarthria: Secondary | ICD-10-CM | POA: Diagnosis present

## 2019-09-21 DIAGNOSIS — Z743 Need for continuous supervision: Secondary | ICD-10-CM | POA: Diagnosis not present

## 2019-09-21 DIAGNOSIS — Z03818 Encounter for observation for suspected exposure to other biological agents ruled out: Secondary | ICD-10-CM | POA: Diagnosis not present

## 2019-09-21 DIAGNOSIS — M8580 Other specified disorders of bone density and structure, unspecified site: Secondary | ICD-10-CM | POA: Diagnosis not present

## 2019-09-21 DIAGNOSIS — Z862 Personal history of diseases of the blood and blood-forming organs and certain disorders involving the immune mechanism: Secondary | ICD-10-CM | POA: Diagnosis not present

## 2019-09-21 DIAGNOSIS — R4182 Altered mental status, unspecified: Secondary | ICD-10-CM

## 2019-09-21 DIAGNOSIS — E1159 Type 2 diabetes mellitus with other circulatory complications: Secondary | ICD-10-CM | POA: Diagnosis present

## 2019-09-21 DIAGNOSIS — R079 Chest pain, unspecified: Secondary | ICD-10-CM | POA: Diagnosis not present

## 2019-09-21 DIAGNOSIS — M1990 Unspecified osteoarthritis, unspecified site: Secondary | ICD-10-CM | POA: Diagnosis not present

## 2019-09-21 DIAGNOSIS — H5347 Heteronymous bilateral field defects: Secondary | ICD-10-CM | POA: Diagnosis not present

## 2019-09-21 DIAGNOSIS — W19XXXA Unspecified fall, initial encounter: Secondary | ICD-10-CM | POA: Diagnosis not present

## 2019-09-21 DIAGNOSIS — E039 Hypothyroidism, unspecified: Secondary | ICD-10-CM | POA: Diagnosis present

## 2019-09-21 DIAGNOSIS — I499 Cardiac arrhythmia, unspecified: Secondary | ICD-10-CM | POA: Diagnosis not present

## 2019-09-21 DIAGNOSIS — Z7401 Bed confinement status: Secondary | ICD-10-CM | POA: Diagnosis not present

## 2019-09-21 DIAGNOSIS — E119 Type 2 diabetes mellitus without complications: Secondary | ICD-10-CM

## 2019-09-21 DIAGNOSIS — K922 Gastrointestinal hemorrhage, unspecified: Secondary | ICD-10-CM | POA: Diagnosis not present

## 2019-09-21 LAB — COMPREHENSIVE METABOLIC PANEL
ALT: 35 U/L (ref 0–44)
AST: 80 U/L — ABNORMAL HIGH (ref 15–41)
Albumin: 3.2 g/dL — ABNORMAL LOW (ref 3.5–5.0)
Alkaline Phosphatase: 80 U/L (ref 38–126)
Anion gap: 13 (ref 5–15)
BUN: 13 mg/dL (ref 8–23)
CO2: 29 mmol/L (ref 22–32)
Calcium: 8.8 mg/dL — ABNORMAL LOW (ref 8.9–10.3)
Chloride: 97 mmol/L — ABNORMAL LOW (ref 98–111)
Creatinine, Ser: 1.03 mg/dL — ABNORMAL HIGH (ref 0.44–1.00)
GFR calc Af Amer: 54 mL/min — ABNORMAL LOW (ref >60)
GFR calc non Af Amer: 46 mL/min — ABNORMAL LOW (ref >60)
Glucose, Bld: 109 mg/dL — ABNORMAL HIGH (ref 70–99)
Potassium: 5.4 mmol/L — ABNORMAL HIGH (ref 3.5–5.1)
Sodium: 139 mmol/L (ref 135–145)
Total Bilirubin: 1.2 mg/dL (ref 0.3–1.2)
Total Protein: 6.8 g/dL (ref 6.5–8.1)

## 2019-09-21 LAB — I-STAT CHEM 8, ED
BUN: 15 mg/dL (ref 8–23)
Calcium, Ion: 0.93 mmol/L — ABNORMAL LOW (ref 1.15–1.40)
Chloride: 99 mmol/L (ref 98–111)
Creatinine, Ser: 0.9 mg/dL (ref 0.44–1.00)
Glucose, Bld: 112 mg/dL — ABNORMAL HIGH (ref 70–99)
HCT: 40 % (ref 36.0–46.0)
Hemoglobin: 13.6 g/dL (ref 12.0–15.0)
Potassium: 3.7 mmol/L (ref 3.5–5.1)
Sodium: 138 mmol/L (ref 135–145)
TCO2: 36 mmol/L — ABNORMAL HIGH (ref 22–32)

## 2019-09-21 LAB — FOLATE: Folate: 13.3 ng/mL (ref >5.9)

## 2019-09-21 LAB — DIFFERENTIAL
Abs Immature Granulocytes: 0.03 10*3/uL (ref 0.00–0.07)
Basophils Absolute: 0 10*3/uL (ref 0.0–0.1)
Basophils Relative: 0 %
Eosinophils Absolute: 0.2 10*3/uL (ref 0.0–0.5)
Eosinophils Relative: 2 %
Immature Granulocytes: 0 %
Lymphocytes Relative: 17 %
Lymphs Abs: 1.3 10*3/uL (ref 0.7–4.0)
Monocytes Absolute: 0.8 10*3/uL (ref 0.1–1.0)
Monocytes Relative: 11 %
Neutro Abs: 5.1 10*3/uL (ref 1.7–7.7)
Neutrophils Relative %: 70 %

## 2019-09-21 LAB — CBC
HCT: 39.4 % (ref 36.0–46.0)
Hemoglobin: 11.6 g/dL — ABNORMAL LOW (ref 12.0–15.0)
MCH: 28.3 pg (ref 26.0–34.0)
MCHC: 29.4 g/dL — ABNORMAL LOW (ref 30.0–36.0)
MCV: 96.1 fL (ref 80.0–100.0)
Platelets: 252 10*3/uL (ref 150–400)
RBC: 4.1 MIL/uL (ref 3.87–5.11)
RDW: 16.7 % — ABNORMAL HIGH (ref 11.5–15.5)
WBC: 7.4 10*3/uL (ref 4.0–10.5)
nRBC: 0 % (ref 0.0–0.2)

## 2019-09-21 LAB — PROTIME-INR
INR: 1.2 (ref 0.8–1.2)
Prothrombin Time: 15.1 seconds (ref 11.4–15.2)

## 2019-09-21 LAB — AMMONIA: Ammonia: 48 umol/L — ABNORMAL HIGH (ref 9–35)

## 2019-09-21 LAB — CBG MONITORING, ED
Glucose-Capillary: 107 mg/dL — ABNORMAL HIGH (ref 70–99)
Glucose-Capillary: 125 mg/dL — ABNORMAL HIGH (ref 70–99)

## 2019-09-21 LAB — VITAMIN B12: Vitamin B-12: 782 pg/mL (ref 180–914)

## 2019-09-21 LAB — TSH: TSH: 1.255 u[IU]/mL (ref 0.350–4.500)

## 2019-09-21 LAB — APTT: aPTT: 30 seconds (ref 24–36)

## 2019-09-21 MED ORDER — ATORVASTATIN CALCIUM 80 MG PO TABS
80.0000 mg | ORAL_TABLET | Freq: Every day | ORAL | Status: DC
  Administered 2019-09-22: 80 mg via ORAL
  Filled 2019-09-21 (×2): qty 1

## 2019-09-21 MED ORDER — ACETAMINOPHEN 650 MG RE SUPP: 650.0000 mg | RECTAL | Status: DC | PRN

## 2019-09-21 MED ORDER — ACETAMINOPHEN 325 MG PO TABS: 650.0000 mg | ORAL_TABLET | ORAL | Status: DC | PRN

## 2019-09-21 MED ORDER — SODIUM CHLORIDE 0.9% FLUSH
3.0000 mL | Freq: Once | INTRAVENOUS | Status: AC
  Administered 2019-09-21: 3 mL via INTRAVENOUS

## 2019-09-21 MED ORDER — ALBUTEROL SULFATE (2.5 MG/3ML) 0.083% IN NEBU: 3.0000 mL | INHALATION_SOLUTION | Freq: Four times a day (QID) | RESPIRATORY_TRACT | Status: DC | PRN

## 2019-09-21 MED ORDER — CLOPIDOGREL BISULFATE 75 MG PO TABS
75.0000 mg | ORAL_TABLET | Freq: Every day | ORAL | Status: DC
  Administered 2019-09-22: 75 mg via ORAL
  Filled 2019-09-21: qty 1

## 2019-09-21 MED ORDER — METHIMAZOLE 5 MG PO TABS
5.0000 mg | ORAL_TABLET | ORAL | Status: DC
  Administered 2019-09-22 – 2019-09-24 (×2): 5 mg via ORAL
  Filled 2019-09-21 (×2): qty 1

## 2019-09-21 MED ORDER — NITROGLYCERIN 0.4 MG SL SUBL: 0.4000 mg | SUBLINGUAL_TABLET | SUBLINGUAL | Status: DC | PRN

## 2019-09-21 MED ORDER — ISOSORBIDE MONONITRATE ER 30 MG PO TB24
30.0000 mg | ORAL_TABLET | Freq: Every day | ORAL | Status: DC
  Administered 2019-09-22 – 2019-09-24 (×2): 30 mg via ORAL
  Filled 2019-09-21 (×2): qty 1

## 2019-09-21 MED ORDER — QUETIAPINE FUMARATE 25 MG PO TABS
25.0000 mg | ORAL_TABLET | Freq: Every day | ORAL | Status: DC
  Administered 2019-09-22: 25 mg via ORAL
  Filled 2019-09-21 (×2): qty 1

## 2019-09-21 MED ORDER — BUDESONIDE 0.5 MG/2ML IN SUSP
0.5000 mg | Freq: Every day | RESPIRATORY_TRACT | Status: DC
  Administered 2019-09-22 – 2019-09-24 (×3): 0.5 mg via RESPIRATORY_TRACT
  Filled 2019-09-21 (×4): qty 2

## 2019-09-21 MED ORDER — ACETAMINOPHEN 160 MG/5ML PO SOLN: 650.0000 mg | ORAL | Status: DC | PRN

## 2019-09-21 MED ORDER — STROKE: EARLY STAGES OF RECOVERY BOOK
Freq: Once | Status: DC
  Filled 2019-09-21: qty 1

## 2019-09-21 MED ORDER — ASPIRIN EC 81 MG PO TBEC
81.0000 mg | DELAYED_RELEASE_TABLET | Freq: Every day | ORAL | Status: DC
  Administered 2019-09-22: 81 mg via ORAL
  Filled 2019-09-21: qty 1

## 2019-09-21 MED ORDER — METOPROLOL TARTRATE 25 MG PO TABS
75.0000 mg | ORAL_TABLET | Freq: Two times a day (BID) | ORAL | Status: DC
  Administered 2019-09-22 – 2019-09-24 (×4): 75 mg via ORAL
  Filled 2019-09-21 (×4): qty 3

## 2019-09-21 MED ORDER — PANTOPRAZOLE SODIUM 40 MG IV SOLR
40.0000 mg | INTRAVENOUS | Status: DC
  Administered 2019-09-21 – 2019-09-22 (×2): 40 mg via INTRAVENOUS
  Filled 2019-09-21 (×2): qty 40

## 2019-09-21 MED ORDER — INSULIN ASPART 100 UNIT/ML ~~LOC~~ SOLN
0.0000 [IU] | Freq: Three times a day (TID) | SUBCUTANEOUS | Status: DC
  Administered 2019-09-21 – 2019-09-22 (×3): 2 [IU] via SUBCUTANEOUS

## 2019-09-21 MED ORDER — FUROSEMIDE 40 MG PO TABS
40.0000 mg | ORAL_TABLET | Freq: Two times a day (BID) | ORAL | Status: DC
  Administered 2019-09-22 – 2019-09-24 (×4): 40 mg via ORAL
  Filled 2019-09-21: qty 2
  Filled 2019-09-21 (×2): qty 1
  Filled 2019-09-21: qty 2
  Filled 2019-09-21: qty 1

## 2019-09-21 NOTE — Progress Notes (Signed)
EEG complete - results pending 

## 2019-09-21 NOTE — ED Provider Notes (Addendum)
[facility] DEPART[facility] Provider Note   CSN: [id number] Arrival date & time: 09/21/19  1642     History No chief complaint on file.   Rebecca Skinner is a 86 y.o. female.  Patient with hx dementia, cva, presents with altered mental status for the past two days, and decreased responsiveness, decreased speech in the past day. Patient last at her baseline two days ago. 2 evenings ago was giving a sleeping pill to help her rest. The following day patient was sleepy and spent most of day in bed. No report of trauma or fall. No focal or unilateral numbness/weakness noted. No fevers.  Pt limited historian, level 5 caveat.   The history is provided by the patient and the EMS personnel. The history is limited by the condition of the patient.       Past Medical History:  Diagnosis Date  . Anxiety 11/20/2011  . Asthma    UNDER THE CARE [facility] DR PAZ  . CAD (coronary artery disease)    s/p CABG s/p AO valve replacement ([place] [facility])  . CVA (cerebral infarction) 08-2013   multiple, L, d/t Afib, started eloquis  . Diabetes mellitus    type 2   . Dizziness    Chronic, admiet 07-2010,saw neuro, thought to be a peripheral issue   . Dry skin   . GERD (gastroesophageal reflux disease)   . Hyperlipidemia   . Hypertension   . Hyperthyroidism   . Keloid    @ chest  . Memory loss   . Osteoarthritis   . Osteopenia    per dexa 12/09  . Paroxysmal atrial fibrillation ([place])    cards d/c coumadin 12/2008 d/t persisten NSR and frequent falls- restarted coumadin july 2011, now on Eliquis  . Recurrent UTI   . Shortness of breath dyspnea   . TIA (transient ischemic attack)     Patient Active Problem List   Diagnosis Date Noted  . Acute on chronic diastolic CHF (congestive heart failure) ([place]) 09/14/2019  . CHF exacerbation ([place]) 09/13/2019  . Acute exacerbation of CHF (congestive heart failure) ([place]) 08/24/2019  . DNR (do not resuscitate) 08/24/2019  . Melena 08/09/2019   . Stroke-like symptom   . Hypokalemia 05/22/2019  . Chest pain 06/21/2017  . TIA (transient ischemic attack) 08/30/2016  . Acute kidney injury ([place]) 03/05/2016  . Exposure keratitis 09/23/2015  . Keratopathy, band 09/13/2015  . PCP NOTES >>>>> 04/05/2015  . Chronic diastolic heart failure ([place]) 12/29/2014  . Edema 12/20/2014  . Dyspnea 12/20/2014  . Angiectasia 03/30/2014  . GI bleed 03/28/2014  . Dementia with behavioral disturbance ([place]) 09/16/2013  . Annual physical exam 11/21/2011  . Macrocytic anemia 07/17/2010  . ATRIAL FIBRILLATION, chronic 03/05/2007  . Diabetes mellitus with circulatory complication ([place]) [id number]  . S/P AVR (aortic valve replacement) 10/15/2006  . Hyperthyroidism 09/03/2006  . Dyslipidemia 09/03/2006  . HTN (hypertension) 09/03/2006  . CAD (coronary artery disease) of artery bypass graft 09/03/2006  . Asthma, chronic 09/03/2006    Past Surgical History:  Procedure Laterality Date  . ABDOMINAL HYSTERECTOMY    . AORTIC VALVE REPLACEMENT  1998  . CARDIAC VALVE REPLACEMENT    . CAROTID DOPPLER  07/13/12   BILATERAL BULB/PROXIMAL ICAS;MILD AMOUNT OF FIBROUS PLAQUE WITH NO DIAMETER REDUCTION.  . CESAREAN SECTION     x 2  . COLONOSCOPY WITH PROPOFOL N/A 03/30/2014   Procedure: COLONOSCOPY WITH PROPOFOL;  Surgeon: Irene Shipper, MD;  Location: [facility];  Service:  Endoscopy;  Laterality: N/A;  . CORONARY ARTERY BYPASS GRAFT  1998   SVG TO RCA  . HEMORRHOID SURGERY    . JOINT REPLACEMENT    . MYOCARDIAL PERFUSION STUDY  12/19/09   NORMAL PATTERN OF PERFUSION IN ALL REGIONS.EF 75%.  . OOPHORECTOMY    . TOTAL KNEE ARTHROPLASTY  1999  . TRANSTHORACIC ECHOCARDIOGRAM  09/11/11   LVEF >55%.STAGE 1 DIASTOLIC DYSFUNCTION,ELEVATED LV FILLING PRESSURE.BIOPROSTHETIC AORTIC VALVE-PEAK AND MEAN GRADIENTS OF 17 MMHG AND 8 MMHG.SIGMOID SEPTUM.MILD TO MOD MR.MILD TO MOD TR.RVSP 46 MMHG.     OB History    Gravida  4   Para  4   Term      Preterm      AB       Living        SAB      TAB      Ectopic      Multiple      Live Births              Family History  Problem Relation Age of Onset  . Heart attack Father   . Coronary artery disease Son 83       deceased  . Hypertension Brother   . Stroke Brother   . Colon cancer Neg Hx   . Breast cancer Neg Hx   . Thyroid disease Neg Hx     Social History   Tobacco Use  . Smoking status: Former Research scientist (life sciences)  . Smokeless tobacco: Never Used  . Tobacco comment: quit 2004, smoked 1.5 ppd  Substance Use Topics  . Alcohol use: No    Alcohol/week: 0.0 standard drinks  . Drug use: No    Home Medications Prior to Admission medications   Medication Sig Start Date End Date Taking? Authorizing Provider  acetaminophen (TYLENOL) 500 MG tablet Take 1,000 mg by mouth every 6 (six) hours as needed (for pain).     [provider]  albuterol (ACCUNEB) 0.63 MG/3ML nebulizer solution Take 3 mLs by nebulization every 6 (six) hours as needed for wheezing or shortness of breath. 05/25/19   Colon Branch, MD  aspirin EC 81 MG tablet Take 1 tablet (81 mg total) by mouth daily. 08/16/19   Colon Branch, MD  atorvastatin (LIPITOR) 40 MG tablet TAKE 1 TABLET BY MOUTH EVERYDAY AT BEDTIME Patient taking differently: Take 40 mg by mouth at bedtime.  06/18/19   Colon Branch, MD  bismuth subsalicylate (PEPTO BISMOL) 262 MG chewable tablet Chew 524 mg by mouth as needed for indigestion or diarrhea or loose stools.    [provider]  budesonide (PULMICORT) 0.5 MG/2ML nebulizer solution Take 2 mLs (0.5 mg total) by nebulization daily. 03/26/19   Colon Branch, MD  Calcium Carbonate-Vitamin D (CALCIUM 500 + D) 500-125 MG-UNIT TABS Take 1 tablet by mouth at bedtime.    [provider]  esomeprazole (NEXIUM) 40 MG capsule Take 1 capsule (40 mg total) by mouth daily as needed. Patient not taking: Reported on 09/14/2019 08/12/19   Colon Branch, MD  furosemide (LASIX) 40 MG tablet Take 1 tablet (40 mg total) by  mouth 2 (two) times daily. 09/17/19   Nita Sells, MD  isosorbide mononitrate (IMDUR) 30 MG 24 hr tablet TAKE 1 TABLET BY MOUTH EVERY DAY 09/17/19   Troy Sine, MD  magnesium oxide (MAG-OX) 400 MG tablet Take 1 tablet (400 mg total) by mouth daily. 03/15/19   Colon Branch, MD  methimazole (TAPAZOLE)  5 MG tablet TAKE 1 TABLET BY MOUTH 3 TIMES A WEEK Patient taking differently: Take 5 mg by mouth every Monday, Wednesday, and Friday.  09/23/18   Colon Branch, MD  metoprolol tartrate 75 MG TABS Take 75 mg by mouth 2 (two) times daily. 09/17/19   Nita Sells, MD  nitroGLYCERIN (NITROSTAT) 0.4 MG SL tablet Place 1 tablet (0.4 mg total) under the tongue every 5 (five) minutes x 3 doses as needed for chest pain. 05/25/19   Colon Branch, MD  potassium chloride (KLOR-CON M10) 10 MEQ tablet Take 1 tablet (10 mEq total) by mouth daily. 07/09/19   Colon Branch, MD  QUEtiapine (SEROQUEL) 25 MG tablet Take 1 tablet (25 mg total) by mouth at bedtime. Patient taking differently: Take 25 mg by mouth at bedtime as needed (Sleep).  08/16/19   Colon Branch, MD    Allergies    Hydrocodone and Tramadol hcl  Review of Systems   Review of Systems  Unable to perform ROS: Dementia  Constitutional: Negative for fever.  level 5 caveat, dementia, altered mental status    Physical Exam Updated Vital Signs There were no vitals taken for this visit.  Physical Exam Vitals and nursing note reviewed.  Constitutional:      Appearance: Normal appearance. She is well-developed.  HENT:     Head: Atraumatic.     Nose: Nose normal.     Mouth/Throat:     Mouth: Mucous membranes are moist.  Eyes:     General: No scleral icterus.    Conjunctiva/sclera: Conjunctivae normal.     Pupils: Pupils are equal, round, and reactive to light.  Neck:     Vascular: No carotid bruit.     Trachea: No tracheal deviation.  Cardiovascular:     Rate and Rhythm: Normal rate and regular rhythm.     Pulses: Normal pulses.      Heart sounds: Normal heart sounds. No murmur. No friction rub. No gallop.   Pulmonary:     Effort: Pulmonary effort is normal. No respiratory distress.     Breath sounds: Normal breath sounds.  Abdominal:     General: Bowel sounds are normal. There is no distension.     Palpations: Abdomen is soft.     Tenderness: There is no abdominal tenderness. There is no guarding.  Genitourinary:    Comments: No cva tenderness.  Musculoskeletal:        General: No swelling.     Cervical back: Normal range of motion and neck supple. No rigidity. No muscular tenderness.  Skin:    General: Skin is warm and dry.     Findings: No rash.  Neurological:     Mental Status: She is alert.     Comments: Alert, speech slow, dysarthric. Appears to gaze to left. Moves bil ext with good strength, follows commands. No facial asymmetry/weakness.  Gait testing deferred.   Psychiatric:        Mood and Affect: Mood normal.      ED Results / Procedures / Treatments   Labs (all labs ordered are listed, but only abnormal results are displayed) Results for orders placed or performed during the hospital encounter of 09/21/19  CBC  Result Value Ref Range   WBC 7.4 4.0 - 10.5 K/uL   RBC 4.10 3.87 - 5.11 MIL/uL   Hemoglobin 11.6 (L) 12.0 - 15.0 g/dL   HCT 39.4 36.0 - 46.0 %   MCV 96.1 80.0 - 100.0 fL  MCH 28.3 26.0 - 34.0 pg   MCHC 29.4 (L) 30.0 - 36.0 g/dL   RDW 16.7 (H) 11.5 - 15.5 %   Platelets 252 150 - 400 K/uL   nRBC 0.0 0.0 - 0.2 %  Differential  Result Value Ref Range   Neutrophils Relative % 70 %   Neutro Abs 5.1 1.7 - 7.7 K/uL   Lymphocytes Relative 17 %   Lymphs Abs 1.3 0.7 - 4.0 K/uL   Monocytes Relative 11 %   Monocytes Absolute 0.8 0.1 - 1.0 K/uL   Eosinophils Relative 2 %   Eosinophils Absolute 0.2 0.0 - 0.5 K/uL   Basophils Relative 0 %   Basophils Absolute 0.0 0.0 - 0.1 K/uL   Immature Granulocytes 0 %   Abs Immature Granulocytes 0.03 0.00 - 0.07 K/uL  Comprehensive metabolic panel    Result Value Ref Range   Sodium 139 135 - 145 mmol/L   Potassium 5.4 (H) 3.5 - 5.1 mmol/L   Chloride 97 (L) 98 - 111 mmol/L   CO2 29 22 - 32 mmol/L   Glucose, Bld 109 (H) 70 - 99 mg/dL   BUN 13 8 - 23 mg/dL   Creatinine, Ser 1.03 (H) 0.44 - 1.00 mg/dL   Calcium 8.8 (L) 8.9 - 10.3 mg/dL   Total Protein 6.8 6.5 - 8.1 g/dL   Albumin 3.2 (L) 3.5 - 5.0 g/dL   AST 80 (H) 15 - 41 U/L   ALT 35 0 - 44 U/L   Alkaline Phosphatase 80 38 - 126 U/L   Total Bilirubin 1.2 0.3 - 1.2 mg/dL   GFR calc non Af Amer 46 (L) >60 mL/min   GFR calc Af Amer 54 (L) >60 mL/min   Anion gap 13 5 - 15  Protime-INR  Result Value Ref Range   Prothrombin Time 15.1 11.4 - 15.2 seconds   INR 1.2 0.8 - 1.2  APTT  Result Value Ref Range   aPTT 30 24 - 36 seconds  I-stat chem 8, ED  Result Value Ref Range   Sodium 138 135 - 145 mmol/L   Potassium 3.7 3.5 - 5.1 mmol/L   Chloride 99 98 - 111 mmol/L   BUN 15 8 - 23 mg/dL   Creatinine, Ser 0.90 0.44 - 1.00 mg/dL   Glucose, Bld 112 (H) 70 - 99 mg/dL   Calcium, Ion 0.93 (L) 1.15 - 1.40 mmol/L   TCO2 36 (H) 22 - 32 mmol/L   Hemoglobin 13.6 12.0 - 15.0 g/dL   HCT 40.0 36.0 - 46.0 %  CBG monitoring, ED  Result Value Ref Range   Glucose-Capillary 107 (H) 70 - 99 mg/dL   DG Chest 2 View  Result Date: 09/13/2019 CLINICAL DATA:  86 year old female with shortness of breath. EXAM: CHEST - 2 VIEW COMPARISON:  Chest radiograph dated 08/24/2019. FINDINGS: There is cardiomegaly with mild vascular congestion and small bilateral pleural effusions relatively similar to prior radiograph. Bibasilar, left greater right atelectasis although infiltrate is not excluded. Clinical correlation is recommended. No pneumothorax. There is atherosclerotic calcification of the aorta. Median sternotomy wires and postsurgical changes of CABG and heart valve replacement. Degenerative changes of the spine. No acute osseous pathology. IMPRESSION: Cardiomegaly with findings of CHF and small bilateral  pleural effusions. Superimposed pneumonia is not excluded. Overall slight interval improvement in the aeration of the lungs since the radiograph of 08/24/2019. Electronically Signed   By: Anner Crete M.D.   On: 09/13/2019 16:22   DG Chest Portable 1 View  Result Date: 08/24/2019 CLINICAL DATA:  Shortness of breath. EXAM: PORTABLE CHEST 1 VIEW COMPARISON:  08/09/2019 FINDINGS: Right and left hemidiaphragm are obscured greater on the left than the right. Signs of median sternotomy and CABG with aortic valve replacement with stable cardiomediastinal contour enlargement. Linear areas of airspace disease as developed since the prior study in the right mid chest and in the left mid chest. Graded opacity at the right and left lung base. No significant skeletal findings aside from median sternotomy. IMPRESSION: Signs of bilateral effusions and basilar airspace disease with interval development of areas of atelectatic change in the mid chest and with worsening opacification in the lung bases particularly in the retrocardiac region. Electronically Signed   By: Zetta Bills M.D.   On: 08/24/2019 10:31   DG Chest Port 1V same Day  Result Date: 09/15/2019 CLINICAL DATA:  Shortness of breath, history of CHF. EXAM: PORTABLE CHEST 1 VIEW COMPARISON:  09/13/2019 chest radiograph. FINDINGS: Mildly hypoinflated lungs. Diffuse interstitial prominence and mild pulmonary vascular congestion, less conspicuous than prior exam. Grossly unchanged left predominant basilar/retrocardiac opacities. No pneumothorax. Small left pleural effusion, unchanged. Cardiomegaly. Sequela of CABG and valvular prosthesis. Multilevel spondylosis. IMPRESSION: Left basilar/retrocardiac opacities and small left pleural effusion are unchanged. Decreased conspicuity of background pulmonary edema. Cardiomegaly, unchanged. Electronically Signed   By: Primitivo Gauze M.D.   On: 09/15/2019 09:59   ECHOCARDIOGRAM COMPLETE  Result Date: 08/25/2019     ECHOCARDIOGRAM REPORT   Patient Name:   Rebecca Skinner Date of Exam: 08/25/2019 Medical Rec #:  [id number]   Height:       62.0 in Accession #:    [id number]  Weight:       182.3 lb Date of Birth:  [birth year]    BSA:          1.838 m Patient Age:    38 years    BP:           155/63 mmHg Patient Gender: F           HR:           65 bpm. Exam Location:  Inpatient Procedure: 2D Echo and Intracardiac Opacification Agent Indications:    CHF-Acute Diastolic [id number] / [id number]  History:        Patient has prior history of Echocardiogram examinations, most                 recent 01/27/2019. CAD, Pacemaker and Prior CABG, TIA and Stroke,                 Arrythmias:Atrial Fibrillation and sick sinus syndrome; Risk                 Factors:Hypertension, Dyslipidemia and Diabetes. GERD. Dementia.                 Aortic Valve: unknown pericardial valve is present in the aortic                 position. Procedure Date: 1998.  Sonographer:    Darlina Sicilian RDCS Referring Phys: [id number] RONDELL A SMITH  Sonographer Comments: Definity attempted, nevery returned to the heart, nurse made aware. IMPRESSIONS  1. Left ventricular ejection fraction, by estimation, is 60 to 65%. The left ventricle has normal function. The left ventricle has no regional wall motion abnormalities. There is moderate left ventricular hypertrophy. Left ventricular diastolic parameters are indeterminate. There is the interventricular septum is flattened in systole and diastole, consistent with right ventricular  pressure and volume overload.  2. Right ventricular systolic function is mildly reduced. The right ventricular size is severely enlarged. There is moderately elevated pulmonary artery systolic pressure. The estimated right ventricular systolic pressure is 123456 mmHg.  3. Left atrial size was mildly dilated.  4. Right atrial size was severely dilated.  5. The mitral valve is normal in structure. Trivial mitral valve regurgitation.  6. Tricuspid valve regurgitation is  moderate to severe.  7. The aortic valve has been repaired/replaced. Aortic valve regurgitation is trivial. There is a bioprosthetic valve present in the aortic position. Normal bioprosthetic function. Mean gradient 49mmHg, EOA 2.1 cm^2, DI 0.62  8. Aortic dilatation noted. There is mild dilatation of the ascending aorta measuring 37 mm.  9. The inferior vena cava is dilated in size with <50% respiratory variability, suggesting right atrial pressure of 15 mmHg. FINDINGS  Left Ventricle: Left ventricular ejection fraction, by estimation, is 60 to 65%. The left ventricle has normal function. The left ventricle has no regional wall motion abnormalities. Definity contrast agent was given IV to delineate the left ventricular  endocardial borders. The left ventricular internal cavity size was small. There is moderate left ventricular hypertrophy. The interventricular septum is flattened in systole and diastole, consistent with right ventricular pressure and volume overload. Left ventricular diastolic parameters are indeterminate. Right Ventricle: The right ventricular size is severely enlarged. Right vetricular wall thickness was not assessed. Right ventricular systolic function is mildly reduced. There is moderately elevated pulmonary artery systolic pressure. The tricuspid regurgitant velocity is 3.67 m/s, and with an assumed right atrial pressure of 15 mmHg, the estimated right ventricular systolic pressure is 123456 mmHg. Left Atrium: Left atrial size was mildly dilated. Right Atrium: Right atrial size was severely dilated. Pericardium: Trivial pericardial effusion is present. Mitral Valve: The mitral valve is normal in structure. Trivial mitral valve regurgitation. MV peak gradient, 13.5 mmHg. The mean mitral valve gradient is 4.5 mmHg. Tricuspid Valve: The tricuspid valve is normal in structure. Tricuspid valve regurgitation is moderate to severe. Aortic Valve: The aortic valve has been repaired/replaced. Aortic valve  regurgitation is trivial. Aortic valve mean gradient measures 9.0 mmHg. Aortic valve peak gradient measures 18.9 mmHg. Aortic valve area, by VTI measures 1.92 cm. There is a unknown  pericardial valve present in the aortic position. Procedure Date: 1998. Pulmonic Valve: The pulmonic valve was grossly normal. Pulmonic valve regurgitation is mild. Aorta: Aortic dilatation noted. There is mild dilatation of the ascending aorta measuring 37 mm. Venous: The inferior vena cava is dilated in size with less than 50% respiratory variability, suggesting right atrial pressure of 15 mmHg. IAS/Shunts: The interatrial septum was not well visualized.  LEFT VENTRICLE PLAX 2D LVIDd:         3.30 cm LVIDs:         2.80 cm LV PW:         1.00 cm LV IVS:        1.20 cm LVOT diam:     1.97 cm LV SV:         77 LV SV Index:   42 LVOT Area:     3.04 cm  LEFT ATRIUM           Index       RIGHT ATRIUM           Index LA diam:      3.70 cm 2.01 cm/m  RA Area:     31.30 cm LA Vol (A4C): 72.4 ml 39.39 ml/m  RA Volume:   128.00 ml 69.64 ml/m  AORTIC VALVE AV Area (Vmax):    1.82 cm AV Area (Vmean):   1.69 cm AV Area (VTI):     1.92 cm AV Vmax:           217.50 cm/s AV Vmean:          141.500 cm/s AV VTI:            0.403 m AV Peak Grad:      18.9 mmHg AV Mean Grad:      9.0 mmHg LVOT Vmax:         130.00 cm/s LVOT Vmean:        78.800 cm/s LVOT VTI:          0.255 m LVOT/AV VTI ratio: 0.63  AORTA Ao Root diam: 3.00 cm Ao Asc diam:  3.70 cm MITRAL VALVE                TRICUSPID VALVE MV Area (PHT): 2.69 cm     TR Peak grad:   53.9 mmHg MV Peak grad:  13.5 mmHg    TR Vmax:        367.00 cm/s MV Mean grad:  4.5 mmHg MV Vmax:       1.84 m/s     SHUNTS MV Vmean:      97.6 cm/s    Systemic VTI:  0.26 m MV Decel Time: 282 msec     Systemic Diam: 1.97 cm MV E velocity: 173.50 cm/s Oswaldo Milian MD Electronically signed by Oswaldo Milian MD Signature Date/Time: 08/25/2019/1:29:11 PM    Final     EKG EKG  Interpretation  Date/Time:  Tuesday September 21 2019 17:04:33 EDT Ventricular Rate:  77 PR Interval:    QRS Duration: 89 QT Interval:  404 QTC Calculation: 458 R Axis:   68 Text Interpretation: Atrial fibrillation Nonspecific T wave abnormality Confirmed by Lajean Saver [phone]) on 09/21/2019 5:31:29 PM   Radiology DG CHEST PORT 1 VIEW  Result Date: 09/21/2019 CLINICAL DATA:  Altered mental status EXAM: PORTABLE CHEST 1 VIEW COMPARISON:  09/15/2019 FINDINGS: Cardiac shadow remains enlarged. Postsurgical changes are again seen as are aortic calcifications. The lungs are well aerated without focal infiltrate or sizable effusion. No bony abnormality is noted. IMPRESSION: No acute abnormality noted. Electronically Signed   By: Inez Catalina M.D.   On: 09/21/2019 18:20   CT HEAD CODE STROKE WO CONTRAST  Result Date: 09/21/2019 CLINICAL DATA:  Code stroke. Neuro deficit, acute, stroke suspected. Possible stroke. Weakness, 4/11 EXAM: CT HEAD WITHOUT CONTRAST TECHNIQUE: Contiguous axial images were obtained from the base of the skull through the vertex without intravenous contrast. COMPARISON:  Brain MRI 05/22/2019, noncontrast head CT and CT angiogram head/neck 05/22/2019 FINDINGS: Brain: There is a 4.5 x 2.7 x 3.1 cm focus of abnormal hypodensity within the right cerebellar hemisphere consistent with acute/subacute infarct. Minimal associated mass effect without significant effacement of the fourth ventricle. There may be subtle associated petechial hemorrhage without frank hemorrhagic conversion. Redemonstrated chronic cortically based infarcts involving the right frontal and parietal lobes as well as well as cortical/subcortical left frontal lobe and left insula. Redemonstrated small chronic cortically based infarct within the right occipital lobe. No new acute supratentorial infarct is identified. No evidence of intracranial mass. No midline shift or extra-axial fluid collection. Stable background  moderate/advanced chronic small vessel ischemic disease within the cerebral white matter. Stable, moderate generalized parenchymal atrophy. Vascular: No hyperdense vessel. Skull: Normal. Negative for  fracture or focal lesion. Sinuses/Orbits: Visualized orbits demonstrate no acute abnormality. Minimal ethmoid sinus mucosal thickening. No significant mastoid effusion. These results were communicated to Dr. Leonel Ramsay At 5:10 pmon 4/13/2021by text page via the [facility] messaging system. IMPRESSION: 4.5 cm acute/subacute infarct within the right cerebellar hemisphere. Minimal associated mass effect without significant fourth ventricular effacement. There may be subtle associated petechial hemorrhage without frank hemorrhagic conversion. Redemonstrated chronic cortically based infarcts within the bilateral cerebral hemispheres. Stable background moderate/advanced chronic small vessel ischemic disease and moderate generalized parenchymal atrophy. Electronically Signed   By: Kellie Simmering DO   On: 09/21/2019 17:12    Procedures Procedures (including critical care time)  Medications Ordered in ED Medications  sodium chloride flush (NS) 0.9 % injection 3 mL (has no administration in time range)    ED Course  I have reviewed the triage vital signs and the nursing notes.  Pertinent labs & imaging results that were available during my care of the patient were reviewed by me and considered in my medical decision making (see chart for details).    MDM Rules/Calculators/A&P                     Iv ns. Continuous pulse ox and monitor. Ecg. Stat ct. Patient was a code stroke activation prior to arrival.  Neurology consulted - evaluated in ED, pt taken to CT.  Reviewed nursing notes and prior charts for additional history.  Prior cva workup 05/2019 noted.   Initial labs reviewed/interpreted by me - glucose normal. Wbc normal.   CT reviewed/intepreted by me - no hem. +cerebellar stroke.   Discussed pt with CT  with Neurology - they indicates admit pt to medicine service.   Hospitalists consulted for admission.  MDM Number of Diagnoses or Management Options   Amount and/or Complexity of Data Reviewed Clinical lab tests: ordered and reviewed Tests in the radiology section of CPT: ordered and reviewed Tests in the medicine section of CPT: ordered and reviewed Discussion of test results with the performing providers: yes Decide to obtain previous medical records or to obtain history from someone other than the patient: yes Obtain history from someone other than the patient: yes Review and summarize past medical records: yes Discuss the patient with other providers: yes Independent visualization of images, tracings, or specimens: yes  Risk of Complications, Morbidity, and/or Mortality Presenting problems: high Diagnostic procedures: high Management options: high    Final Clinical Impression(s) / ED Diagnoses Final diagnoses:  None    Rx / DC Orders ED Discharge Orders    None         Lajean Saver, MD 09/21/19 1912

## 2019-09-21 NOTE — Consult Note (Addendum)
Neurology Consultation  Reason for Consult: Code stroke Referring Physician: Dr. Ashok Cordia  CC: Altered mental status/left gaze preference  History is obtained from: Daughter  HPI: Rebecca Skinner is a 86 y.o. female with history of TIA, paroxysmal atrial fibrillation who was on Eliquis until approximately 3 weeks ago when she was noted to have a GI bleed and taken off of Eliquis.  Patient has hypertension, hyperlipidemia diabetes and CAD.  Patient's last known normal was last Sunday.  Apparently over Monday she was not getting out of bed.  Today daughter also noticed that she did not get out of bed all day.  Daughter noted she needed significant help to take a bath.  Daughter went out to mow the lawn at approximately 1530 and the last known word from her mother was "yeah".  Upon returning at approximately 1600 hrs. daughter noted that she had a left gaze preference left-sided weakness and called EMS.  Upon arrival patient was showing echolalia, right gaze preference.  She is not a TPA and or a IR candidate at this time.  ED course  CT head shows-shows a 4.5 cm acute/subacute infarct in the right cerebellar hemisphere.  Minimal associated mass-effect without significant fourth ventricle effacement.  Chart review-patient was seen on 05/22/2019 for consult on stroke and altered mental status.  Chief complaint was aphasia at that time.  History at the time of patient was normal earlier in the day and then seemed confused later in the day EMS was called.  On my consultation patient had some word substitution errors, hesitant speech.  She did have a right hemianopsia.  MRI of head at that time did not show any acute intracranial abnormality.  Patient had LDL of 49, HbA1c of 7.2, echocardiogram EF of 60-65%, EKG showed atrial fibrillation.  CTA of head and neck showed no LVO.  Patient was on Eliquis at that time.  LKW: 09/19/2019 tpa given?: no, out of window Premorbid modified Rankin scale (mRS): 3 NIH stroke  score: 10   Past Medical History:  Diagnosis Date  . Anxiety 11/20/2011  . Asthma    UNDER THE CARE [facility] DR PAZ  . CAD (coronary artery disease)    s/p CABG s/p AO valve replacement ([place] [facility])  . CVA (cerebral infarction) 08-2013   multiple, L, d/t Afib, started eloquis  . Diabetes mellitus    type 2   . Dizziness    Chronic, admiet 07-2010,saw neuro, thought to be a peripheral issue   . Dry skin   . GERD (gastroesophageal reflux disease)   . Hyperlipidemia   . Hypertension   . Hyperthyroidism   . Keloid    @ chest  . Memory loss   . Osteoarthritis   . Osteopenia    per dexa 12/09  . Paroxysmal atrial fibrillation ([place])    cards d/c coumadin 12/2008 d/t persisten NSR and frequent falls- restarted coumadin july 2011, now on Eliquis  . Recurrent UTI   . Shortness of breath dyspnea   . TIA (transient ischemic attack)    Family History  Problem Relation Age of Onset  . Heart attack Father   . Coronary artery disease Son 36       deceased  . Hypertension Brother   . Stroke Brother   . Colon cancer Neg Hx   . Breast cancer Neg Hx   . Thyroid disease Neg Hx    Social History:   reports that she has quit smoking. She has never used smokeless  tobacco. She reports that she does not drink alcohol or use drugs.  Medications  Current Facility-Administered Medications:  .  sodium chloride flush (NS) 0.9 % injection 3 mL, 3 mL, Intravenous, Once, Lajean Saver, MD  Current Outpatient Medications:  .  acetaminophen (TYLENOL) 500 MG tablet, Take 1,000 mg by mouth every 6 (six) hours as needed (for pain). , Disp: , Rfl:  .  albuterol (ACCUNEB) 0.63 MG/3ML nebulizer solution, Take 3 mLs by nebulization every 6 (six) hours as needed for wheezing or shortness of breath., Disp: 75 mL, Rfl: 5 .  aspirin EC 81 MG tablet, Take 1 tablet (81 mg total) by mouth daily., Disp:  , Rfl:  .  atorvastatin (LIPITOR) 40 MG tablet, TAKE 1 TABLET BY MOUTH EVERYDAY AT BEDTIME (Patient taking  differently: Take 40 mg by mouth at bedtime. ), Disp: 90 tablet, Rfl: 3 .  bismuth subsalicylate (PEPTO BISMOL) 262 MG chewable tablet, Chew 524 mg by mouth as needed for indigestion or diarrhea or loose stools., Disp: , Rfl:  .  budesonide (PULMICORT) 0.5 MG/2ML nebulizer solution, Take 2 mLs (0.5 mg total) by nebulization daily., Disp: 360 mL, Rfl: 1 .  Calcium Carbonate-Vitamin D (CALCIUM 500 + D) 500-125 MG-UNIT TABS, Take 1 tablet by mouth at bedtime., Disp: , Rfl:  .  esomeprazole (NEXIUM) 40 MG capsule, Take 1 capsule (40 mg total) by mouth daily as needed. (Patient not taking: Reported on 09/14/2019), Disp: 90 capsule, Rfl: 3 .  furosemide (LASIX) 40 MG tablet, Take 1 tablet (40 mg total) by mouth 2 (two) times daily., Disp: 60 tablet, Rfl: 1 .  isosorbide mononitrate (IMDUR) 30 MG 24 hr tablet, TAKE 1 TABLET BY MOUTH EVERY DAY, Disp: 90 tablet, Rfl: 1 .  magnesium oxide (MAG-OX) 400 MG tablet, Take 1 tablet (400 mg total) by mouth daily., Disp: 90 tablet, Rfl: 3 .  methimazole (TAPAZOLE) 5 MG tablet, TAKE 1 TABLET BY MOUTH 3 TIMES A WEEK (Patient taking differently: Take 5 mg by mouth every Monday, Wednesday, and Friday. ), Disp: 30 tablet, Rfl: 5 .  metoprolol tartrate 75 MG TABS, Take 75 mg by mouth 2 (two) times daily., Disp: 30 tablet, Rfl: 3 .  nitroGLYCERIN (NITROSTAT) 0.4 MG SL tablet, Place 1 tablet (0.4 mg total) under the tongue every 5 (five) minutes x 3 doses as needed for chest pain., Disp: 25 tablet, Rfl: 3 .  potassium chloride (KLOR-CON M10) 10 MEQ tablet, Take 1 tablet (10 mEq total) by mouth daily., Disp: 90 tablet, Rfl: 1 .  QUEtiapine (SEROQUEL) 25 MG tablet, Take 1 tablet (25 mg total) by mouth at bedtime. (Patient taking differently: Take 25 mg by mouth at bedtime as needed (Sleep). ), Disp: 30 tablet, Rfl: 3  ROS: Unable to obtain due to patient's mental status and inability to express herself.  Exam: Current vital signs: BP (!) 152/80 (BP Location: Left Arm)   Pulse  82   Temp 98.7 F (37.1 C) (Oral)   Resp 18   Ht 5\' 3"  (1.6 m)   Wt 73.9 kg   SpO2 100%   BMI 28.86 kg/m  Vital signs in last 24 hours: Temp:  [98.7 F (37.1 C)] 98.7 F (37.1 C) (04/13 1710) Pulse Rate:  [82] 82 (04/13 1710) Resp:  [18] 18 (04/13 1710) BP: (152)/(80) 152/80 (04/13 1710) SpO2:  [100 %] 100 % (04/13 1710) Weight:  [73.9 kg] 73.9 kg (04/13 1708)   Constitutional: Frail and cachectic.  Eyes: No scleral injection HENT: No  OP obstrucion Head: Normocephalic.  Cardiovascular: Palpable Respiratory: Effort normal, non-labored breathing GI: Soft.  No distension. There is no tenderness.  Skin: WDI  Neuro: Mental Status: Patient is awake, alert, able to follow commands such as closing her eyes and making a fist.  She also was able to count fingers on the left aspect of visual field.  She showed mild dysarthria and echolalia.  Cranial Nerves: II: Right hemianopsia III,IV, VI: Left gaze preference however is able to mildly cross midline to the right. Pupils equal, round and reactive to light V: Facial sensation is symmetric to temperature VII: Left nasolabial fold decrease VIII: hearing is intact to voice X: Palat elevates symmetrically XI: Shoulder shrug is symmetric. XII: tongue is midline without atrophy or fasciculations.  Motor: Moving all extremities antigravity and showed no drift and bilateral upper and lower extremities. Sensory: Very difficult to assess as patient was unreliable in her answers Deep Tendon Reflexes: 2+ and symmetric in the biceps and patellae.  Plantars: Toes are downgoing bilaterally.  Cerebellar: No dysmetria noted with upper extremities could not obtain lower extremities  It should be noted that a formal exam was difficult as patient was having difficult time following commands.  Labs I have reviewed labs in epic and the results pertinent to this consultation are:   CBC    Component Value Date/Time   WBC 9.1 09/17/2019 0336    RBC 4.18 09/17/2019 0336   HGB 13.6 09/21/2019 1658   HCT 40.0 09/21/2019 1658   PLT 258 09/17/2019 0336   MCV 93.1 09/17/2019 0336   MCV 97.1 (A) 05/20/2016 1631   MCH 28.2 09/17/2019 0336   MCHC 30.3 09/17/2019 0336   RDW 17.2 (H) 09/17/2019 0336   LYMPHSABS 1.4 09/17/2019 0336   MONOABS 1.2 (H) 09/17/2019 0336   EOSABS 0.1 09/17/2019 0336   BASOSABS 0.0 09/17/2019 0336    CMP     Component Value Date/Time   NA 138 09/21/2019 1658   NA 143 12/29/2017 1045   K 3.7 09/21/2019 1658   CL 99 09/21/2019 1658   CO2 33 (H) 09/17/2019 0336   GLUCOSE 112 (H) 09/21/2019 1658   BUN 15 09/21/2019 1658   BUN 16 12/29/2017 1045   CREATININE 0.90 09/21/2019 1658   CREATININE 0.98 08/12/2013 0840   CALCIUM 9.2 09/17/2019 0336   PROT 5.7 (L) 09/14/2019 0304   ALBUMIN 2.8 (L) 09/14/2019 0304   AST 25 09/14/2019 0304   ALT 17 09/14/2019 0304   ALKPHOS 78 09/14/2019 0304   BILITOT 0.4 09/14/2019 0304   GFRNONAA 53 (L) 09/17/2019 0336   GFRAA >60 09/17/2019 0336    Lipid Panel     Component Value Date/Time   CHOL 97 05/23/2019 0514   TRIG 27 05/23/2019 0514   HDL 43 05/23/2019 0514   CHOLHDL 2.3 05/23/2019 0514   VLDL 5 05/23/2019 0514   LDLCALC 49 05/23/2019 0514     Imaging I have reviewed the images obtained:  CT head shows-shows a 4.5 cm acute/subacute infarct in the right cerebellar hemisphere.  Minimal associated mass-effect without significant fourth ventricle effacement.  Etta Quill PA-C [facility] [phone]  M-F  (9:00 am- 5:00 PM)  09/21/2019, 5:17 PM   I have seen the patient reviewed the above note  Assessment:  This is a 86 year old female presenting to the emergency room as a code stroke secondary to left gaze preference and altered mental status.  As noted above patient was taken off her Eliquis approximately 3 to  6 weeks ago secondary to GI bleed.  Last known normal was 3 days ago.  CT confirms that she did have a right cerebellar  infarct.  I wonder if she has had additional strokes to further explain her mental status.   Impression: -Altered mental status -Cerebellar infarct on the right -Right hemianopia which is old.  Recommendations: Recommend -MRI of the brain without contrast -MRA of head -Carotid Dopplers -Transthoracic Echo,  -Await on starting aspirin until cleared by GI  -Start or continue Atorvastatin 80 mg/other high intensity statin -BP goal: permissive HTN upto 220/120 mmHg -HBAIC and Lipid profile -Telemetry monitoring -Frequent neuro checks -NPO until passes stroke swallow screen -PT/OT -TSH, B12, folate, ammonia, UA, chest x-ray # please page stroke NP  Or  PA  Or MD from 8am -4 pm  as this patient from this time will be  followed by the stroke.   You can look them up on [email or web address]  Password [email or web address]  Roland Rack, MD [facility] [phone]  If 7pm- 7am, please page neurology on call as listed in [place].

## 2019-09-21 NOTE — H&P (Addendum)
History and Physical    BAYLYN SICKLES GQB:[id number] DOB: [birth year] DOA: 09/21/2019  PCP: Colon Branch, MD  Patient coming from: home   Chief Complaint:  Chief Complaint  Patient presents with  . Code Stroke     HPI:    86 year old female with past medical history of chronic atrial fibrillation, CVA and TIA in the past, diabetes mellitus type 2, gastroesophageal reflux disease, recent gastrointestinal bleeding (08/2019 treated at [facility], conservative mgmt), coronary artery disease, Graves' disease/hypothyroidism, hyperlipidemia who presents to [facility] after being found by family to have difficulty with speech and confusion.  Patient is an extremely poor historian secondary to severe lethargy and therefore the majority the history has been obtained from the daughter who is at the bedside.  According to the daughter, yesterday the patient was complaining of "leg pain" and inability to get out of bed. This was associated with a single episode of falling out of bed.  Today, patient again did not get out of bed and in the early afternoon the patient began to exhibit garbled speech, severe lethargy and confusion.  Daughter denies any other associated symptoms such as changes in gait, fever, sick contacts, complaints of chest pain.  It is worth noting that patient's Eliquis was discontinued in March due to concern for gastrointestinal bleeding when the patient presented with a hemoglobin of 6.1.  Upon evaluation in [facility] emergency department, patient was found to have a 4.5 cm acute/subacute infarct within the right cerebellar hemisphere with minimal associated mass-effect and subtle associated petechial hemorrhage without frank hemorrhagic conversion. A code stroke was called, although the patient was certainly outside the TPA window. Dr. Leonel Ramsay neurology did evaluate the patient in the emergency department. Hospitalist group was called to assess patient for admission the  hospital.     Review of Systems: Unable to fully obtain due to patient being severely lethargic due to acute stroke.  Past Medical History:  Diagnosis Date  . Anxiety 11/20/2011  . Asthma    UNDER THE CARE [facility] DR PAZ  . CAD (coronary artery disease)    s/p CABG s/p AO valve replacement ([place] [facility])  . CVA (cerebral infarction) 08-2013   multiple, L, d/t Afib, started eloquis  . Diabetes mellitus    type 2   . Dizziness    Chronic, admiet 07-2010,saw neuro, thought to be a peripheral issue   . Dry skin   . GERD (gastroesophageal reflux disease)   . Hyperlipidemia   . Hypertension   . Hyperthyroidism   . Keloid    @ chest  . Memory loss   . Osteoarthritis   . Osteopenia    per dexa 12/09  . Paroxysmal atrial fibrillation ([employer])    cards d/c coumadin 12/2008 d/t persisten NSR and frequent falls- restarted coumadin july 2011, now on Eliquis  . Recurrent UTI   . Shortness of breath dyspnea   . TIA (transient ischemic attack)     Past Surgical History:  Procedure Laterality Date  . ABDOMINAL HYSTERECTOMY    . AORTIC VALVE REPLACEMENT  1998  . CARDIAC VALVE REPLACEMENT    . CAROTID DOPPLER  07/13/12   BILATERAL BULB/PROXIMAL ICAS;MILD AMOUNT OF FIBROUS PLAQUE WITH NO DIAMETER REDUCTION.  . CESAREAN SECTION     x 2  . COLONOSCOPY WITH PROPOFOL N/A 03/30/2014   Procedure: COLONOSCOPY WITH PROPOFOL;  Surgeon: Irene Shipper, MD;  Location: [facility] ENDOSCOPY;  Service: Endoscopy;  Laterality: N/A;  . CORONARY ARTERY  BYPASS GRAFT  1998   SVG TO RCA  . HEMORRHOID SURGERY    . JOINT REPLACEMENT    . MYOCARDIAL PERFUSION STUDY  12/19/09   NORMAL PATTERN OF PERFUSION IN ALL REGIONS.EF 75%.  . OOPHORECTOMY    . TOTAL KNEE ARTHROPLASTY  1999  . TRANSTHORACIC ECHOCARDIOGRAM  09/11/11   LVEF >55%.STAGE 1 DIASTOLIC DYSFUNCTION,ELEVATED LV FILLING PRESSURE.BIOPROSTHETIC AORTIC VALVE-PEAK AND MEAN GRADIENTS OF 17 MMHG AND 8 MMHG.SIGMOID SEPTUM.MILD TO MOD MR.MILD TO MOD TR.RVSP 46 MMHG.      reports that she has quit smoking. She has never used smokeless tobacco. She reports that she does not drink alcohol or use drugs.  Allergies  Allergen Reactions  . Hydrocodone Itching and Nausea Only  . Tramadol Hcl Itching and Nausea Only    Family History  Problem Relation Age of Onset  . Heart attack Father   . Coronary artery disease Son 57       deceased  . Hypertension Brother   . Stroke Brother   . Colon cancer Neg Hx   . Breast cancer Neg Hx   . Thyroid disease Neg Hx      Prior to Admission medications   Medication Sig Start Date End Date Taking? Authorizing Provider  acetaminophen (TYLENOL) 500 MG tablet Take 1,000 mg by mouth every 6 (six) hours as needed (for pain).     [provider]  albuterol (ACCUNEB) 0.63 MG/3ML nebulizer solution Take 3 mLs by nebulization every 6 (six) hours as needed for wheezing or shortness of breath. 05/25/19   Colon Branch, MD  aspirin EC 81 MG tablet Take 1 tablet (81 mg total) by mouth daily. 08/16/19   Colon Branch, MD  atorvastatin (LIPITOR) 40 MG tablet TAKE 1 TABLET BY MOUTH EVERYDAY AT BEDTIME Patient taking differently: Take 40 mg by mouth at bedtime.  06/18/19   Colon Branch, MD  bismuth subsalicylate (PEPTO BISMOL) 262 MG chewable tablet Chew 524 mg by mouth as needed for indigestion or diarrhea or loose stools.    [provider]  budesonide (PULMICORT) 0.5 MG/2ML nebulizer solution Take 2 mLs (0.5 mg total) by nebulization daily. 03/26/19   Colon Branch, MD  Calcium Carbonate-Vitamin D (CALCIUM 500 + D) 500-125 MG-UNIT TABS Take 1 tablet by mouth at bedtime.    [provider]  esomeprazole (NEXIUM) 40 MG capsule Take 1 capsule (40 mg total) by mouth daily as needed. Patient not taking: Reported on 09/14/2019 08/12/19   Colon Branch, MD  furosemide (LASIX) 40 MG tablet Take 1 tablet (40 mg total) by mouth 2 (two) times daily. 09/17/19   Nita Sells, MD  isosorbide mononitrate (IMDUR) 30 MG 24 hr  tablet TAKE 1 TABLET BY MOUTH EVERY DAY 09/17/19   Troy Sine, MD  magnesium oxide (MAG-OX) 400 MG tablet Take 1 tablet (400 mg total) by mouth daily. 03/15/19   Colon Branch, MD  methimazole (TAPAZOLE) 5 MG tablet TAKE 1 TABLET BY MOUTH 3 TIMES A WEEK Patient taking differently: Take 5 mg by mouth every Monday, Wednesday, and Friday.  09/23/18   Colon Branch, MD  metoprolol tartrate 75 MG TABS Take 75 mg by mouth 2 (two) times daily. 09/17/19   Nita Sells, MD  nitroGLYCERIN (NITROSTAT) 0.4 MG SL tablet Place 1 tablet (0.4 mg total) under the tongue every 5 (five) minutes x 3 doses as needed for chest pain. 05/25/19   Colon Branch, MD  potassium chloride (KLOR-CON M10)  10 MEQ tablet Take 1 tablet (10 mEq total) by mouth daily. 07/09/19   Colon Branch, MD  QUEtiapine (SEROQUEL) 25 MG tablet Take 1 tablet (25 mg total) by mouth at bedtime. Patient taking differently: Take 25 mg by mouth at bedtime as needed (Sleep).  08/16/19   Colon Branch, MD    Physical Exam: Vitals:   09/21/19 1915 09/21/19 1930 09/21/19 1945 09/21/19 2000  BP: (!) 154/64 (!) 157/64 (!) 165/85 (!) 162/70  Pulse: 77 83 86 92  Resp: _0 Temp:      TempSrc:      SpO2: 100% 100% 100% 100%  Weight:      Height:        Constitutional: Extremely lethargic but arousable, oriented x1. Patient is currently not in any acute distress. Skin: Poor skin turgor noted. No rashes or lesions noted.  Eyes: Left pupil is minimally reactive. There is left lateral deviation of the left eye. No evidence of scleral icterus or conjunctival pallor.  ENMT: Dry mucous membranes noted. Posterior pharynx clear of any exudate or lesions.  Neck: normal, supple, no masses, no thyromegaly Respiratory: clear to auscultation bilaterally, no wheezing no org, no crackles. Normal respiratory effort. No accessory muscle use.  Cardiovascular: Regularly irregular rate and rhythm. No murmurs / rubs / gallops. No extremity edema. 2+ pedal pulses. No  carotid bruits.  Back:   Nontender without crepitus or deformity. Abdomen: Abdomen is soft and nontender.  No evidence of intra-abdominal masses.  Positive bowel sounds noted in all quadrants.   Musculoskeletal: No joint deformity upper and lower extremities. Good ROM, no contractures. Normal muscle tone.  Neurologic: Patient is intermittently following commands due to severe lethargy and confusion. Patient seemingly has a left gaze preference. Patient was also exhibiting left lateral deviation of the left eye. Patient exhibits significant dysarthria when attempting to converse with the patient.  Psychiatric: Unable to fully assess due to substantial lethargy and inability to participate in conversation. Patient currently does not seem to possess insight as to her current situation.   Labs on Admission: I have personally reviewed following labs and imaging studies -   CBC: Recent Labs  Lab 09/16/19 0541 09/17/19 0336 09/21/19 1658 09/21/19 1723  WBC 7.0 9.1  --  7.4  NEUTROABS 4.6 6.4  --  5.1  HGB 11.8* 11.8* 13.6 11.6*  HCT 39.8 38.9 40.0 39.4  MCV 94.3 93.1  --  96.1  PLT 225 258  --  086   Basic Metabolic Panel: Recent Labs  Lab 09/15/19 0906 09/16/19 0541 09/17/19 0336 09/21/19 1658 09/21/19 1723  NA 141 140 138 138 139  K 4.4 3.3* 4.1 3.7 5.4*  CL 94* 94* 92* 99 97*  CO2 37* 34* 33*  --  29  GLUCOSE 121* 123* 135* 112* 109*  BUN _1 CREATININE 0.97 0.81 0.92 0.90 1.03*  CALCIUM 9.0 8.9 9.2  --  8.8*   GFR: Estimated Creatinine Clearance: 32.2 mL/min (A) (by C-G formula based on SCr of 1.03 mg/dL (H)). Liver Function Tests: Recent Labs  Lab 09/21/19 1723  AST 80*  ALT 35  ALKPHOS 80  BILITOT 1.2  PROT 6.8  ALBUMIN 3.2*   No results for input(s): LIPASE, AMYLASE in the last 168 hours. Recent Labs  Lab 09/21/19 1818  AMMONIA 48*   Coagulation Profile: Recent Labs  Lab 09/21/19 1818  INR 1.2   Cardiac Enzymes: No results for input(s):  CKTOTAL,  CKMB, CKMBINDEX, TROPONINI in the last 168 hours. BNP (last 3 results) No results for input(s): PROBNP in the last 8760 hours. HbA1C: No results for input(s): HGBA1C in the last 72 hours. CBG: Recent Labs  Lab 09/17/19 0025 09/17/19 0445 09/17/19 0826 09/17/19 1225 09/21/19 1650  GLUCAP 128* 116* 117* 113* 107*   Lipid Profile: No results for input(s): CHOL, HDL, LDLCALC, TRIG, CHOLHDL, LDLDIRECT in the last 72 hours. Thyroid Function Tests: Recent Labs    09/21/19 1818  TSH 1.255   Anemia Panel: Recent Labs    09/21/19 1818  VITAMINB12 782  FOLATE 13.3   Urine analysis:    Component Value Date/Time   COLORURINE YELLOW 08/24/2019 1155   APPEARANCEUR HAZY (A) 08/24/2019 1155   LABSPEC 1.012 08/24/2019 1155   PHURINE 5.0 08/24/2019 1155   GLUCOSEU NEGATIVE 08/24/2019 1155   GLUCOSEU NEGATIVE 01/30/2017 1229   HGBUR NEGATIVE 08/24/2019 1155   HGBUR negative 12/18/2009 0937   BILIRUBINUR NEGATIVE 08/24/2019 1155   BILIRUBINUR Neg 11/20/2011 1458   KETONESUR NEGATIVE 08/24/2019 1155   PROTEINUR 30 (A) 08/24/2019 1155   UROBILINOGEN 0.2 01/30/2017 1229   NITRITE NEGATIVE 08/24/2019 1155   LEUKOCYTESUR SMALL (A) 08/24/2019 1155    Radiological Exams on Admission personally reviewed: DG CHEST PORT 1 VIEW  Result Date: 09/21/2019 CLINICAL DATA:  Altered mental status EXAM: PORTABLE CHEST 1 VIEW COMPARISON:  09/15/2019 FINDINGS: Cardiac shadow remains enlarged. Postsurgical changes are again seen as are aortic calcifications. The lungs are well aerated without focal infiltrate or sizable effusion. No bony abnormality is noted. IMPRESSION: No acute abnormality noted. Electronically Signed   By: Inez Catalina M.D.   On: 09/21/2019 18:20   CT HEAD CODE STROKE WO CONTRAST  Result Date: 09/21/2019 CLINICAL DATA:  Code stroke. Neuro deficit, acute, stroke suspected. Possible stroke. Weakness, 4/11 EXAM: CT HEAD WITHOUT CONTRAST TECHNIQUE: Contiguous axial images were  obtained from the base of the skull through the vertex without intravenous contrast. COMPARISON:  Brain MRI 05/22/2019, noncontrast head CT and CT angiogram head/neck 05/22/2019 FINDINGS: Brain: There is a 4.5 x 2.7 x 3.1 cm focus of abnormal hypodensity within the right cerebellar hemisphere consistent with acute/subacute infarct. Minimal associated mass effect without significant effacement of the fourth ventricle. There may be subtle associated petechial hemorrhage without frank hemorrhagic conversion. Redemonstrated chronic cortically based infarcts involving the right frontal and parietal lobes as well as well as cortical/subcortical left frontal lobe and left insula. Redemonstrated small chronic cortically based infarct within the right occipital lobe. No new acute supratentorial infarct is identified. No evidence of intracranial mass. No midline shift or extra-axial fluid collection. Stable background moderate/advanced chronic small vessel ischemic disease within the cerebral white matter. Stable, moderate generalized parenchymal atrophy. Vascular: No hyperdense vessel. Skull: Normal. Negative for fracture or focal lesion. Sinuses/Orbits: Visualized orbits demonstrate no acute abnormality. Minimal ethmoid sinus mucosal thickening. No significant mastoid effusion. These results were communicated to Dr. Leonel Ramsay At 5:10 pmon 4/13/2021by text page via the [facility] messaging system. IMPRESSION: 4.5 cm acute/subacute infarct within the right cerebellar hemisphere. Minimal associated mass effect without significant fourth ventricular effacement. There may be subtle associated petechial hemorrhage without frank hemorrhagic conversion. Redemonstrated chronic cortically based infarcts within the bilateral cerebral hemispheres. Stable background moderate/advanced chronic small vessel ischemic disease and moderate generalized parenchymal atrophy. Electronically Signed   By: Kellie Simmering DO   On: 09/21/2019 17:12     EKG: Personally reviewed.  Rhythm is atrial fibrillation with heart rate of 77.  No dynamic ST segment changes appreciated.  Assessment/Plan Active Problems:   Cerebellar stroke, acute (HCC)   Notable significant 4.5 cm acute/subacute infarct in the right cerebellar hemisphere on initial non contrast CT scan.  Patient is already been evaluated by Dr. Leonel Ramsay with neurology. Neurology recommends initiation of antiplatelet therapy once cleared by gastroenterology.  Case has been discussed with Dr. Cathleen Corti with Gastroenterology. During patient's normal hemoglobin, advanced age and comorbidities they state that initiation and titration of antiplatelet therapy is appropriate however patient's hemoglobin and hematocrit should be monitored and managed conservatively. They will gladly consult on the patient officially in the morning.  Discussed again with Neurology, (Dr. Cheral Marker) after speaking to GI.  He agrees with DAPT and would like for GI to provide their opinion of the safety of an eventual switch to Eliquis in the future (on a date determined by the code team) once they come and evaluate the patient tomorrow.   Since patient is already taking aspirin 81 mg by mouth daily in the outpatient setting, will cautiously initiate clopidogrel 75 mg daily and monitor patient closely with serial CBCs.   Increasing home regimen of Lipitor to 80 mg daily.  Patient is being admitted to the stepdown unit due to some evidence of mass-effect and risk of clinical deterioration.  Continuing neurologic checks  Swallow screen passed in the emergency department  PT/OT evaluation ordered  MRI brain, echocardiogram, carotid ultrasounds ordered per neurology recommendations.    Diabetes mellitus type II, controlled ([place])  . Patient been placed on Accu-Cheks before every meal and nightly . Last hemoglobin A1c of 6.1% on 4/6. Marland Kitchen Diabetic Diet  Patient is not on therapy in the outpatient setting     HTN (hypertension)   Conservative management considering acute stroke  Continuing home regimen of metoprolol considering known history of atrial fibrillation    ATRIAL FIBRILLATION, chronic   Continuing home regimen metoprolol  Of note, regimen of Eliquis was discontinued in early March due to concerns for gastrointestinal bleeding. Fortunately, endoscopic work-up was not performed due to advanced age and comorbidities.  Patient is following as an outpatient with Dr. Henrene Pastor with [facility].  Case was discussed with Dr. Cathleen Corti with GI tonight, who recommends close monitoring with CBCs as we titrate patient on antiplatelet therapy for her acute stroke. He states that it is unlikely that we would proceed with any endoscopic work-up during this hospitalization considering advanced age and comorbidities.  Discussed again with Neurology, Dr. Cheral Marker after speaking to GI.  He agrees with DAPT and would like for GI to provide their opinion of the safety of an eventual switch to Eliquis in the near future once they come and evaluate the patient tomorrow.   Monitoring on telemetry    Chronic diastolic heart failure (HCC)   No evidence of cardiogenic volume overload at this time  Monitoring patient on telemetry  Strict input and output monitoring  Continue home regimen of Lasix therapy    Hyperkalemia   Mild hyperkalemia noted on admission labs however there is also a note that this is likely hemolyzed sample  Will repeat chemistry to reevaluate potassium levels  Will continue home regimen of diuretic as this should help in regulating potassium  discontinuing outpatient daily potassium supplement    Chronic respiratory failure with hypoxia ([place])   Continue outpatient home regimen of supplemental oxygen 2 L/min via nasal cannula    Mixed hyperlipidemia   Increasing home regimen of Lipitor to 80 mg daily.   Goals  of Care   Discussed patient's guarded prognosis  considering advanced age and multiple comorbidities with substantial stroke and high risk of titration of anticoagulation  Patient's daughter does confirm that the patient is a DNR. Patient's daughter is power of attorney  Possibility of hospice was discussed with the daughter, who states that she will discuss this possibility with family. Daytime provider can follow-up on this with the daughter later in the hospitalization.  Code Status:  DNR Family Communication: Daughter at bedside with power of attorney Disposition Plan: Patient is anticipated to be discharged to home with home health services once patient has met maximum benefit from current hospitalization.   Consults called: Dr. Leonel Ramsay evaluated patient in ER. Continue to follow throughout hospitalization. Case discussed with Dr. Cathleen Corti with GI who will be happy to be consulted on the patient through the stay. Admission status: Patient will be admitted to Inpatient and is anticipated to remain in the hospital for greater than 2 midnights.   Vernelle Emerald MD [facility] Pager [phone]  If 7PM-7AM, please contact night-coverage [email or web address] Use universal  password for that web site. If you do not have the password, please call the hospital operator.  09/21/2019, 8:40 PM

## 2019-09-21 NOTE — Code Documentation (Signed)
Code Stroke called enroute by EMS initially thought to be lkw at 1530 this afternoon. Upon further conversation with her daughter LKW actually Sunday.  Per her daughter she can normally walk on her own but she had to help her out of bed yesterday.  Today she had additional difficulty speaking. Stat labs and head CT done Dr Leonel Ramsay met patient upon arrival  NIHSS 9 Unable to answer questions, left gaze preference, Right hemianopia, mild left facial droop, Sever aphasia, moderate dysarthria and neglect of the right.   Exam limited by patient difficulty attending and following commands.  She also frequently repeated the last thing said.  She also has a history of prior CVA  Plan admit

## 2019-09-21 NOTE — ED Triage Notes (Signed)
Pt from home with [facility] for code stroke, lsn 1530. Per daughter, pt having garbled speech, EMS noted left sided facial droop and left sided gaze. Equal grips, no weakness. CBG 177, VSS, Pt alert, airway cleared by DR Ralene Bathe at the bridge and taken to CT

## 2019-09-21 NOTE — ED Notes (Signed)
Pt transported to MRI with daughter.

## 2019-09-21 NOTE — ED Notes (Signed)
EEG at bedside.

## 2019-09-22 ENCOUNTER — Inpatient Hospital Stay (HOSPITAL_COMMUNITY): Payer: Medicare Other

## 2019-09-22 DIAGNOSIS — I482 Chronic atrial fibrillation, unspecified: Secondary | ICD-10-CM | POA: Diagnosis not present

## 2019-09-22 DIAGNOSIS — R4182 Altered mental status, unspecified: Secondary | ICD-10-CM | POA: Diagnosis not present

## 2019-09-22 DIAGNOSIS — Z862 Personal history of diseases of the blood and blood-forming organs and certain disorders involving the immune mechanism: Secondary | ICD-10-CM | POA: Diagnosis not present

## 2019-09-22 DIAGNOSIS — J9611 Chronic respiratory failure with hypoxia: Secondary | ICD-10-CM | POA: Diagnosis not present

## 2019-09-22 DIAGNOSIS — I361 Nonrheumatic tricuspid (valve) insufficiency: Secondary | ICD-10-CM | POA: Diagnosis not present

## 2019-09-22 DIAGNOSIS — K922 Gastrointestinal hemorrhage, unspecified: Secondary | ICD-10-CM | POA: Diagnosis not present

## 2019-09-22 DIAGNOSIS — Z8679 Personal history of other diseases of the circulatory system: Secondary | ICD-10-CM

## 2019-09-22 DIAGNOSIS — I639 Cerebral infarction, unspecified: Principal | ICD-10-CM

## 2019-09-22 DIAGNOSIS — I5032 Chronic diastolic (congestive) heart failure: Secondary | ICD-10-CM | POA: Diagnosis not present

## 2019-09-22 DIAGNOSIS — I10 Essential (primary) hypertension: Secondary | ICD-10-CM | POA: Diagnosis not present

## 2019-09-22 LAB — URINALYSIS, ROUTINE W REFLEX MICROSCOPIC
Bilirubin Urine: NEGATIVE
Glucose, UA: NEGATIVE mg/dL
Hgb urine dipstick: NEGATIVE
Ketones, ur: NEGATIVE mg/dL
Leukocytes,Ua: NEGATIVE
Nitrite: NEGATIVE
Protein, ur: NEGATIVE mg/dL
Specific Gravity, Urine: 1.017 (ref 1.005–1.030)
pH: 5 (ref 5.0–8.0)

## 2019-09-22 LAB — CBG MONITORING, ED
Glucose-Capillary: 130 mg/dL — ABNORMAL HIGH (ref 70–99)
Glucose-Capillary: 105 mg/dL — ABNORMAL HIGH (ref 70–99)
Glucose-Capillary: 143 mg/dL — ABNORMAL HIGH (ref 70–99)
Glucose-Capillary: 160 mg/dL — ABNORMAL HIGH (ref 70–99)

## 2019-09-22 LAB — ECHOCARDIOGRAM COMPLETE
Height: 63 in
Weight: 2606.72 oz

## 2019-09-22 LAB — RENAL FUNCTION PANEL
Albumin: 3.1 g/dL — ABNORMAL LOW (ref 3.5–5.0)
Anion gap: 14 (ref 5–15)
BUN: 13 mg/dL (ref 8–23)
CO2: 28 mmol/L (ref 22–32)
Calcium: 8.9 mg/dL (ref 8.9–10.3)
Chloride: 97 mmol/L — ABNORMAL LOW (ref 98–111)
Creatinine, Ser: 0.93 mg/dL (ref 0.44–1.00)
GFR calc Af Amer: >60 mL/min (ref >60)
GFR calc non Af Amer: 53 mL/min — ABNORMAL LOW (ref >60)
Glucose, Bld: 114 mg/dL — ABNORMAL HIGH (ref 70–99)
Phosphorus: 4.1 mg/dL (ref 2.5–4.6)
Potassium: 3.8 mmol/L (ref 3.5–5.1)
Sodium: 139 mmol/L (ref 135–145)

## 2019-09-22 LAB — LIPID PANEL
Cholesterol: 109 mg/dL (ref 0–200)
HDL: 32 mg/dL — ABNORMAL LOW (ref >40)
LDL Cholesterol: 66 mg/dL (ref 0–99)
Total CHOL/HDL Ratio: 3.4 RATIO
Triglycerides: 54 mg/dL (ref <150)
VLDL: 11 mg/dL (ref 0–40)

## 2019-09-22 LAB — SARS CORONAVIRUS 2 (TAT 6-24 HRS): SARS Coronavirus 2: NEGATIVE

## 2019-09-22 LAB — GLUCOSE, CAPILLARY: Glucose-Capillary: 105 mg/dL — ABNORMAL HIGH (ref 70–99)

## 2019-09-22 MED ORDER — ATORVASTATIN CALCIUM 40 MG PO TABS
40.0000 mg | ORAL_TABLET | Freq: Every day | ORAL | Status: DC
  Administered 2019-09-24: 40 mg via ORAL
  Filled 2019-09-22: qty 1

## 2019-09-22 MED ORDER — ASPIRIN 325 MG PO TABS
325.0000 mg | ORAL_TABLET | Freq: Every day | ORAL | Status: DC
  Administered 2019-09-24: 325 mg via ORAL
  Filled 2019-09-22: qty 1

## 2019-09-22 NOTE — Progress Notes (Signed)
Carotid duplex       has been completed. Preliminary results can be found under CV proc through chart review. Bernette Seeman, BS, RDMS, RVT   

## 2019-09-22 NOTE — Progress Notes (Addendum)
PROGRESS NOTE    Rebecca Skinner  [id number] DOB: July 06, 1922 DOA: 09/21/2019 PCP: Colon Branch, MD   Brief Narrative:  86 year old with history of chronic A. fib, CVA, TIA, DM2, GERD, recent GI bleeding conservatively managed, CAD, Graves' disease, HLD found to have dysarthria and confusion.  Eliquis recently discontinued due to GI bleeding.  Diagnosed with acute 4.5 cm right cerebellar infarct.  Neurology consulted and patient admitted to the hospital.   Assessment & Plan:   Active Problems:   Diabetes mellitus type II, controlled ([place])   Mixed hyperlipidemia   HTN (hypertension)   ATRIAL FIBRILLATION, chronic   Cerebellar stroke, acute (HCC)   Chronic diastolic heart failure (HCC)   Hyperkalemia   Chronic respiratory failure with hypoxia (HCC)  Acute 4.5 cm cerebellar CVA, right-sided -Patient seen by neurology.  Started on antiplatelet therapy. -Neurology recommending aspirin enteric-coated 325 mg daily. No need for Plavix or Eliquis -Lipitor 80 mg daily -PT/OT -Neurology following -MRI brain-acute right superior cerebellar CVA, right superior cerebellar artery occlusion, moderate stenosis of the right ICA.  Other old chronic ischemic infarcts Carotid Dopplers 1-39% b/l  Diabetes mellitus type 2 -Hemoglobin A1c 6.1 -Not on any home medication -Sliding scale Accu-Chek  Essential hypertension -Continue metoprolol.  Otherwise permissive hypertension  Chronic atrial fibrillation -Eliquis recently discontinued due to GI bleeding -Currently on aspirin and Plavix  Congestive heart failure with preserved ejection fraction -Euvolemic in nature.  Resume home meds  Chronic hypoxic respiratory failure, 2 L nasal cannula -Continue supplemental oxygen.  Supportive care  Hyperlipidemia -Lipitor 80 mg daily  Goals of care discussion   DVT prophylaxis: Aspirin Plavix, otherwise SCDs Code Status: DNR Family Communication: Daughter at bedside Disposition Plan:   Patient From=  home  Patient Anticipated D/C place= Home  Barriers= maintain hospital stay for acute evaluation for CVA.  Still requires PT/OT.  Will need to monitor her for any further bleeding in the hospital for at least next 24 to 48 hours  Subjective: Laying in the bed, no complaints.  She is quite tired from staying up all night.  Daughter is present at bedside.  Review of Systems Otherwise negative except as per HPI, including: General: Denies fever, chills, night sweats or unintended weight loss. Resp: Denies cough, wheezing, shortness of breath. Cardiac: Denies chest pain, palpitations, orthopnea, paroxysmal nocturnal dyspnea. GI: Denies abdominal pain, nausea, vomiting, diarrhea or constipation GU: Denies dysuria, frequency, hesitancy or incontinence MS: Denies muscle aches, joint pain or swelling Neuro: Denies headache, neurologic deficits (focal weakness, numbness, tingling), abnormal gait Psych: Denies anxiety, depression, SI/HI/AVH Skin: Denies new rashes or lesions ID: Denies sick contacts, exotic exposures, travel  Examination:  General exam: Elderly frail Respiratory system: Clear to auscultation. Respiratory effort normal. Cardiovascular system: S1 & S2 heard, RRR. No JVD, murmurs, rubs, gallops or clicks. No pedal edema. Gastrointestinal system: Abdomen is nondistended, soft and nontender. No organomegaly or masses felt. Normal bowel sounds heard. Central nervous system: Alert and oriented. No focal neurological deficits. Extremities: Symmetric 5 x 5 power. Skin: No rashes, lesions or ulcers Psychiatry: Judgement and insight appear poor. Mood & affect appropriate.     Objective: Vitals:   09/22/19 0400 09/22/19 0500 09/22/19 0600 09/22/19 0700  BP: 129/60 (!) 142/58 (!) 140/47 (!) 133/49  Pulse: 89 78 86 94  Resp: (!) 25 12 (!) 23 (!) 24  Temp:      TempSrc:      SpO2: 100% 97% 100% 98%  Weight:  Height:       No intake or output data in the 24 hours ending  09/22/19 0854 Filed Weights   09/21/19 1708  Weight: 73.9 kg     Data Reviewed:   CBC: Recent Labs  Lab 09/16/19 0541 09/17/19 0336 09/21/19 1658 09/21/19 1723  WBC 7.0 9.1  --  7.4  NEUTROABS 4.6 6.4  --  5.1  HGB 11.8* 11.8* 13.6 11.6*  HCT 39.8 38.9 40.0 39.4  MCV 94.3 93.1  --  96.1  PLT 225 258  --  [id number]   Basic Metabolic Panel: Recent Labs  Lab 09/15/19 0906 09/15/19 0906 09/16/19 0541 09/17/19 0336 09/21/19 1658 09/21/19 1723 09/22/19 0546  NA 141   < > 140 138 138 139 139  K 4.4   < > 3.3* 4.1 3.7 5.4* 3.8  CL 94*   < > 94* 92* 99 97* 97*  CO2 37*  --  34* 33*  --  29 28  GLUCOSE 121*   < > 123* 135* 112* 109* 114*  BUN 10   < > 10 10 15 13 13   CREATININE 0.97   < > 0.81 0.92 0.90 1.03* 0.93  CALCIUM 9.0  --  8.9 9.2  --  8.8* 8.9  PHOS  --   --   --   --   --   --  4.1   < > = values in this interval not displayed.   GFR: Estimated Creatinine Clearance: 35.6 mL/min (by C-G formula based on SCr of 0.93 mg/dL). Liver Function Tests: Recent Labs  Lab 09/21/19 1723 09/22/19 0546  AST 80*  --   ALT 35  --   ALKPHOS 80  --   BILITOT 1.2  --   PROT 6.8  --   ALBUMIN 3.2* 3.1*   No results for input(s): LIPASE, AMYLASE in the last 168 hours. Recent Labs  Lab 09/21/19 1818  AMMONIA 48*   Coagulation Profile: Recent Labs  Lab 09/21/19 1818  INR 1.2   Cardiac Enzymes: No results for input(s): CKTOTAL, CKMB, CKMBINDEX, TROPONINI in the last 168 hours. BNP (last 3 results) No results for input(s): PROBNP in the last 8760 hours. HbA1C: No results for input(s): HGBA1C in the last 72 hours. CBG: Recent Labs  Lab 09/17/19 0826 09/17/19 1225 09/21/19 1650 09/21/19 2046 09/22/19 0730  GLUCAP 117* 113* 107* 125* 130*   Lipid Profile: Recent Labs    09/22/19 0546  CHOL 109  HDL 32*  LDLCALC 66  TRIG 54  CHOLHDL 3.4   Thyroid Function Tests: Recent Labs    09/21/19 1818  TSH 1.255   Anemia Panel: Recent Labs    09/21/19 1818    VITAMINB12 782  FOLATE 13.3   Sepsis Labs: No results for input(s): PROCALCITON, LATICACIDVEN in the last 168 hours.  Recent Results (from the past 240 hour(s))  SARS CORONAVIRUS 2 (TAT 6-24 HRS) Nasopharyngeal Nasopharyngeal Swab     Status: None   Collection Time: 09/13/19 10:41 PM   Specimen: Nasopharyngeal Swab  Result Value Ref Range Status   SARS Coronavirus 2 NEGATIVE NEGATIVE Final    Comment: (NOTE) SARS-CoV-2 target nucleic acids are NOT DETECTED. The SARS-CoV-2 RNA is generally detectable in upper and lower respiratory specimens during the acute phase of infection. Negative results do not preclude SARS-CoV-2 infection, do not rule out co-infections with other pathogens, and should not be used as the sole basis for treatment or other patient management decisions. Negative results must be combined  with clinical observations, patient history, and epidemiological information. The expected result is Negative. Fact Sheet for Patients: SugarRoll.be Fact Sheet for Healthcare Providers: https://www.woods-mathews.com/ This test is not yet approved or cleared by the [place] FDA and  has been authorized for detection and/or diagnosis of SARS-CoV-2 by FDA under an Emergency Use Authorization (EUA). This EUA will remain  in effect (meaning this test can be used) for the duration of the COVID-19 declaration under Section 56 4(b)(1) of the Act, 21 U.S.C. section 360bbb-3(b)(1), unless the authorization is terminated or revoked sooner. Performed at [facility], [place] [address] [place], [place] [address]          Radiology Studies: MR ANGIO HEAD WO CONTRAST  Result Date: 09/21/2019 CLINICAL DATA:  Acute neurologic deficit EXAM: MRI HEAD WITHOUT CONTRAST MRA HEAD WITHOUT CONTRAST TECHNIQUE: Multiplanar, multiecho pulse sequences of the brain and surrounding structures were obtained without intravenous contrast. Angiographic  images of the head were obtained using MRA technique without contrast. COMPARISON:  Brain MRI 05/22/2019 Head CT 09/21/2019 FINDINGS: MRI HEAD FINDINGS Brain: There is an acute infarct of the right superior cerebellar artery territory. No other acute ischemia. No hemorrhage or mass effect. Old bilateral frontal and right parietal infarct. Diffuse generalized atrophy. Diffuse confluent hyperintense T2-weighted signal within the periventricular, deep and juxtacortical white matter, most commonly due to chronic ischemic microangiopathy. fewer than 5 scattered microhemorrhages in a nonspecific pattern. Normal midline structures. Vascular: See discussion below Skull and upper cervical spine: Normal marrow signal. Sinuses/Orbits: Negative. Other: None. MRA HEAD FINDINGS POSTERIOR CIRCULATION: --Vertebral arteries: Right dominant V4 segments. Diminutive left V4. --Posterior inferior cerebellar arteries (PICA): Patent origins from the vertebral arteries. --Anterior inferior cerebellar arteries (AICA): Patent origins from the basilar artery. --Basilar artery: Normal. --Superior cerebellar arteries: Occluded on the right. Normal left. --Posterior cerebral arteries: Normal. Both originate from the basilar artery. Posterior communicating arteries (p-comm) are diminutive or absent. ANTERIOR CIRCULATION: --Intracranial internal carotid arteries: Moderate stenosis of the distal cavernous segment of the right ICA. Normal left. --Anterior cerebral arteries (ACA): Normal. Both A1 segments are present. Patent anterior communicating artery (a-comm). --Middle cerebral arteries (MCA): Normal. IMPRESSION: 1. Acute infarct of the right superior cerebellar artery territory. No hemorrhage or mass effect. 2. Right superior cerebellar artery occlusion. 3. Moderate stenosis of the right internal carotid artery distal cavernous segment. 4. Severe chronic ischemic microangiopathy, multiple old infarcts and generalized atrophy. Electronically  Signed   By: Ulyses Jarred M.D.   On: 09/21/2019 22:50   MR BRAIN WO CONTRAST  Result Date: 09/21/2019 CLINICAL DATA:  Acute neurologic deficit EXAM: MRI HEAD WITHOUT CONTRAST MRA HEAD WITHOUT CONTRAST TECHNIQUE: Multiplanar, multiecho pulse sequences of the brain and surrounding structures were obtained without intravenous contrast. Angiographic images of the head were obtained using MRA technique without contrast. COMPARISON:  Brain MRI 05/22/2019 Head CT 09/21/2019 FINDINGS: MRI HEAD FINDINGS Brain: There is an acute infarct of the right superior cerebellar artery territory. No other acute ischemia. No hemorrhage or mass effect. Old bilateral frontal and right parietal infarct. Diffuse generalized atrophy. Diffuse confluent hyperintense T2-weighted signal within the periventricular, deep and juxtacortical white matter, most commonly due to chronic ischemic microangiopathy. fewer than 5 scattered microhemorrhages in a nonspecific pattern. Normal midline structures. Vascular: See discussion below Skull and upper cervical spine: Normal marrow signal. Sinuses/Orbits: Negative. Other: None. MRA HEAD FINDINGS POSTERIOR CIRCULATION: --Vertebral arteries: Right dominant V4 segments. Diminutive left V4. --Posterior inferior cerebellar arteries (PICA): Patent origins from the vertebral arteries. --Anterior  inferior cerebellar arteries (AICA): Patent origins from the basilar artery. --Basilar artery: Normal. --Superior cerebellar arteries: Occluded on the right. Normal left. --Posterior cerebral arteries: Normal. Both originate from the basilar artery. Posterior communicating arteries (p-comm) are diminutive or absent. ANTERIOR CIRCULATION: --Intracranial internal carotid arteries: Moderate stenosis of the distal cavernous segment of the right ICA. Normal left. --Anterior cerebral arteries (ACA): Normal. Both A1 segments are present. Patent anterior communicating artery (a-comm). --Middle cerebral arteries (MCA):  Normal. IMPRESSION: 1. Acute infarct of the right superior cerebellar artery territory. No hemorrhage or mass effect. 2. Right superior cerebellar artery occlusion. 3. Moderate stenosis of the right internal carotid artery distal cavernous segment. 4. Severe chronic ischemic microangiopathy, multiple old infarcts and generalized atrophy. Electronically Signed   By: Ulyses Jarred M.D.   On: 09/21/2019 22:50   DG CHEST PORT 1 VIEW  Result Date: 09/21/2019 CLINICAL DATA:  Altered mental status EXAM: PORTABLE CHEST 1 VIEW COMPARISON:  09/15/2019 FINDINGS: Cardiac shadow remains enlarged. Postsurgical changes are again seen as are aortic calcifications. The lungs are well aerated without focal infiltrate or sizable effusion. No bony abnormality is noted. IMPRESSION: No acute abnormality noted. Electronically Signed   By: Inez Catalina M.D.   On: 09/21/2019 18:20   CT HEAD CODE STROKE WO CONTRAST  Result Date: 09/21/2019 CLINICAL DATA:  Code stroke. Neuro deficit, acute, stroke suspected. Possible stroke. Weakness, 4/11 EXAM: CT HEAD WITHOUT CONTRAST TECHNIQUE: Contiguous axial images were obtained from the base of the skull through the vertex without intravenous contrast. COMPARISON:  Brain MRI 05/22/2019, noncontrast head CT and CT angiogram head/neck 05/22/2019 FINDINGS: Brain: There is a 4.5 x 2.7 x 3.1 cm focus of abnormal hypodensity within the right cerebellar hemisphere consistent with acute/subacute infarct. Minimal associated mass effect without significant effacement of the fourth ventricle. There may be subtle associated petechial hemorrhage without frank hemorrhagic conversion. Redemonstrated chronic cortically based infarcts involving the right frontal and parietal lobes as well as well as cortical/subcortical left frontal lobe and left insula. Redemonstrated small chronic cortically based infarct within the right occipital lobe. No new acute supratentorial infarct is identified. No evidence of  intracranial mass. No midline shift or extra-axial fluid collection. Stable background moderate/advanced chronic small vessel ischemic disease within the cerebral white matter. Stable, moderate generalized parenchymal atrophy. Vascular: No hyperdense vessel. Skull: Normal. Negative for fracture or focal lesion. Sinuses/Orbits: Visualized orbits demonstrate no acute abnormality. Minimal ethmoid sinus mucosal thickening. No significant mastoid effusion. These results were communicated to Dr. Leonel Ramsay At 5:10 pmon 4/13/2021by text page via the [facility] messaging system. IMPRESSION: 4.5 cm acute/subacute infarct within the right cerebellar hemisphere. Minimal associated mass effect without significant fourth ventricular effacement. There may be subtle associated petechial hemorrhage without frank hemorrhagic conversion. Redemonstrated chronic cortically based infarcts within the bilateral cerebral hemispheres. Stable background moderate/advanced chronic small vessel ischemic disease and moderate generalized parenchymal atrophy. Electronically Signed   By: Kellie Simmering DO   On: 09/21/2019 17:12        Scheduled Meds: .  stroke: mapping our early stages of recovery book   Does not apply Once  . aspirin EC  81 mg Oral Daily  . atorvastatin  80 mg Oral Daily  . budesonide  0.5 mg Nebulization Daily  . clopidogrel  75 mg Oral Daily  . furosemide  40 mg Oral BID  . insulin aspart  0-15 Units Subcutaneous TID AC & HS  . isosorbide mononitrate  30 mg Oral Daily  . methimazole  5 mg  Oral Q M,W,F  . metoprolol tartrate  75 mg Oral BID  . pantoprazole (PROTONIX) IV  40 mg Intravenous Q24H  . QUEtiapine  25 mg Oral QHS   Continuous Infusions:   LOS: 1 day   Time spent= 35 mins    Bartosz Luginbill Arsenio Loader, MD [facility]  If 7PM-7AM, please contact night-coverage  09/22/2019, 8:54 AM

## 2019-09-22 NOTE — Evaluation (Signed)
Physical Therapy Evaluation Patient Details Name: Rebecca Skinner MRN: [id number] DOB: 01-11-1923 Today's Date: 09/22/2019   History of Present Illness  Pt is a 86 y/o female admitted secondary to AMS and falling out of bed. Found to have acute infarct in R cerebellar artery territory. EEG showed diffuse encephalopathy. PMH includes DM, HTN, a fib, dementia, CVA, and CAD s/p CABG.   Clinical Impression  Pt admitted secondary to problem above with deficits below. Pt with L lateral gaze and unable to track past midline. Pt also with difficulty performing finger to nose test on RUE. Pt only agreeable to coming to long sitting on stretcher, however, then became slightly agitated and stated she would not perform further mobility tasks. Unsure of pt's baseline cognition and PLOF. Feel she would benefit from SNF level therapies at d/c. Will continue to follow acutely to maximize functional mobility independence and safety.     Follow Up Recommendations SNF;Supervision/Assistance - 24 hour    Equipment Recommendations  Wheelchair cushion (measurements PT);Wheelchair (measurements PT)    Recommendations for Other Services       Precautions / Restrictions Precautions Precautions: Fall Restrictions Weight Bearing Restrictions: No      Mobility  Bed Mobility Overal bed mobility: Needs Assistance             General bed mobility comments: Max A to come into long sitting, however, pt getting agitated and stated "I don't want to do anymore" and abruptly returned to supine.   Transfers                    Ambulation/Gait                Stairs            Wheelchair Mobility    Modified Rankin (Stroke Patients Only) Modified Rankin (Stroke Patients Only) Pre-Morbid Rankin Score: Moderately severe disability Modified Rankin: Severe disability     Balance                                             Pertinent Vitals/Pain Pain Assessment: No/denies  pain    Home Living Family/patient expects to be discharged to:: Private residence Living Arrangements: Children Available Help at Discharge: Family;Available 24 hours/day Type of Home: House Home Access: Level entry     Home Layout: One level Home Equipment: Clinical cytogeneticist - 4 wheels;Grab bars - tub/shower;Cane - quad Additional Comments: Information from previous admission as pt getting agitated with questions.     Prior Function           Comments: Unsure of PLOF as pt getting agitated with questions and presenting with cognitive deficits.      Hand Dominance        Extremity/Trunk Assessment   Upper Extremity Assessment Upper Extremity Assessment: Defer to OT evaluation    Lower Extremity Assessment Lower Extremity Assessment: Difficult to assess due to impaired cognition;RLE deficits/detail;Generalized weakness;LLE deficits/detail RLE Deficits / Details: Was unable to perform formal assessment secondary to cognitive deficits. Was able to perform heel slide and ankle pump.  LLE Deficits / Details: Was unable to perform formal assessment secondary to cognitive deficits. Was able to perform heel slide and ankle pump.        Communication   Communication: HOH  Cognition Arousal/Alertness: Awake/alert Behavior During Therapy: Flat affect Overall Cognitive Status: No family/caregiver present  to determine baseline cognitive functioning                                 General Comments: Pt alert to person and place only. Pt getting agitated when asked questions. Slowed processing and difficulty sequencing.       General Comments General comments (skin integrity, edema, etc.): Pt with L lateral gaze. Was unable to track past midline with multiple attemps. Also had difficulty performing finger to nose test on RUE, however, was able to perform with LUE. Positioned head in neutral position using towel roll at end of session.     Exercises      Assessment/Plan    PT Assessment Patient needs continued PT services  PT Problem List Decreased strength;Decreased activity tolerance;Decreased balance;Decreased mobility;Decreased coordination;Decreased cognition;Decreased knowledge of use of DME;Decreased knowledge of precautions       PT Treatment Interventions Gait training;DME instruction;Functional mobility training;Therapeutic activities;Therapeutic exercise;Balance training;Patient/family education;Cognitive remediation;Neuromuscular re-education;Wheelchair mobility training    PT Goals (Current goals can be found in the Care Plan section)  Acute Rehab PT Goals PT Goal Formulation: Patient unable to participate in goal setting Time For Goal Achievement: 10/06/19 Potential to Achieve Goals: Fair    Frequency Min 3X/week   Barriers to discharge        Co-evaluation               AM-PAC PT "6 Clicks" Mobility  Outcome Measure Help needed turning from your back to your side while in a flat bed without using bedrails?: A Lot Help needed moving from lying on your back to sitting on the side of a flat bed without using bedrails?: Total Help needed moving to and from a bed to a chair (including a wheelchair)?: Total Help needed standing up from a chair using your arms (e.g., wheelchair or bedside chair)?: Total Help needed to walk in hospital room?: Total Help needed climbing 3-5 steps with a railing? : Total 6 Click Score: 7    End of Session   Activity Tolerance: Treatment limited secondary to agitation Patient left: in bed;with call bell/phone within reach;with nursing/sitter in room Nurse Communication: Mobility status PT Visit Diagnosis: Unsteadiness on feet (R26.81);Muscle weakness (generalized) (M62.81);History of falling (Z91.81);Difficulty in walking, not elsewhere classified (R26.2)    Time: [id number] PT Time Calculation (min) (ACUTE ONLY): 11 min   Charges:   PT Evaluation $PT Eval Moderate  Complexity: 1 Mod          Reuel Derby, PT, DPT  Acute Rehabilitation Services  Pager: [phone] Office: [phone]   Rudean Hitt 09/22/2019, 5:42 PM

## 2019-09-22 NOTE — Consult Note (Signed)
[facility] Consult: 8:27 AM 09/22/2019  LOS: 1 day    Referring Provider: Dr Damaris Hippo  Primary Care Physician:  Colon Branch, MD Primary Gastroenterologist:  Dr Richmond Campbell.      Reason for Consultation:  Anti-coagulation, antiplatelet medication dilemma.     HPI: Rebecca Skinner is a 86 y.o. female.  PMH as listed below including reflux, CAD status post CABG, CVA, TIA diabetes, asthma and on chronic anticoagulation with Eliquis. Colon adenoma in 1986, none on subsequent colonoscopies through 2005.     05/2014 colonoscopy.  Bleeding AVM at cecum, treated with APC.  Left-sided diverticulosis.  During 08/09/19 admission GI consulted for dark, FOBT + stool, IDA, Hgb 6.7 (baseline ~11), transfusion 1 PRBC.  Dr. Fuller Plan recommended holding Eliquis and reconsider long-term benefits of this medication.  IV iron replacement and transfusion.  Due to her advanced dementia and significant comorbidities, was not felt a good candidate for colonoscopy due to challenges with bowel prep but possibly consider EGD.   After the single day admission of 3/121, she had 2 subsequent admissions 3/16 -08/30/2019 with CHF exacerbation.  Hgb ~  9.5.   4/5-09/17/2019 admission with CHF flare, hypokalemia, metabolic alkalosis. Hgb 11.8.  Discharge medications included Nexium 40 mg prn, bismuth prn, ASA 81/day, iron once daily.  Now readmitted with acute stroke.  Hgb is 11. She was started on Plavix yesterday. She has had no GI issues, eating well, no abdominal pain.  Takes once daily oral iron and her stools tend to be dark.  No melena.  No constipation.  Was ambulatory with walker until Monday.  Neurology and hospitalist requesting GI input regarding anticoagulation.     Past Medical History:  Diagnosis Date  . Anxiety 11/20/2011  . Asthma     UNDER THE CARE [facility] DR PAZ  . CAD (coronary artery disease)    s/p CABG s/p AO valve replacement ([place] [facility])  . CVA (cerebral infarction) 08-2013   multiple, L, d/t Afib, started eloquis  . Diabetes mellitus    type 2   . Dizziness    Chronic, admiet 07-2010,saw neuro, thought to be a peripheral issue   . Dry skin   . GERD (gastroesophageal reflux disease)   . Hyperlipidemia   . Hypertension   . Hyperthyroidism   . Keloid    @ chest  . Memory loss   . Osteoarthritis   . Osteopenia    per dexa 12/09  . Paroxysmal atrial fibrillation ([place])    cards d/c coumadin 12/2008 d/t persisten NSR and frequent falls- restarted coumadin july 2011, now on Eliquis  . Recurrent UTI   . Shortness of breath dyspnea   . TIA (transient ischemic attack)     Past Surgical History:  Procedure Laterality Date  . ABDOMINAL HYSTERECTOMY    . AORTIC VALVE REPLACEMENT  1998  . CARDIAC VALVE REPLACEMENT    . CAROTID DOPPLER  07/13/12   BILATERAL BULB/PROXIMAL ICAS;MILD AMOUNT OF FIBROUS PLAQUE WITH NO DIAMETER REDUCTION.  . CESAREAN SECTION     x 2  .  COLONOSCOPY WITH PROPOFOL N/A 03/30/2014   Procedure: COLONOSCOPY WITH PROPOFOL;  Surgeon: Irene Shipper, MD;  Location: [facility];  Service: Endoscopy;  Laterality: N/A;  . CORONARY ARTERY BYPASS GRAFT  1998   SVG TO RCA  . HEMORRHOID SURGERY    . JOINT REPLACEMENT    . MYOCARDIAL PERFUSION STUDY  12/19/09   NORMAL PATTERN OF PERFUSION IN ALL REGIONS.EF 75%.  . OOPHORECTOMY    . TOTAL KNEE ARTHROPLASTY  1999  . TRANSTHORACIC ECHOCARDIOGRAM  09/11/11   LVEF >55%.STAGE 1 DIASTOLIC DYSFUNCTION,ELEVATED LV FILLING PRESSURE.BIOPROSTHETIC AORTIC VALVE-PEAK AND MEAN GRADIENTS OF 17 MMHG AND 8 MMHG.SIGMOID SEPTUM.MILD TO MOD MR.MILD TO MOD TR.RVSP 46 MMHG.    Prior to Admission medications   Medication Sig Start Date End Date Taking? Authorizing Provider  acetaminophen (TYLENOL) 500 MG tablet Take 1,000 mg by mouth every 6 (six) hours as needed (for  pain).    Yes [provider]  albuterol (ACCUNEB) 0.63 MG/3ML nebulizer solution Take 3 mLs by nebulization every 6 (six) hours as needed for wheezing or shortness of breath. 05/25/19  Yes Colon Branch, MD  aspirin EC 81 MG tablet Take 1 tablet (81 mg total) by mouth daily. 08/16/19  Yes Paz, Alda Berthold, MD  atorvastatin (LIPITOR) 40 MG tablet TAKE 1 TABLET BY MOUTH EVERYDAY AT BEDTIME Patient taking differently: Take 40 mg by mouth at bedtime.  06/18/19  Yes Paz, Jacqulyn Bath E, MD  budesonide (PULMICORT) 0.5 MG/2ML nebulizer solution Take 2 mLs (0.5 mg total) by nebulization daily. 03/26/19  Yes Paz, Alda Berthold, MD  Calcium Carbonate-Vitamin D (CALCIUM 500 + D) 500-125 MG-UNIT TABS Take 1 tablet by mouth at bedtime.   Yes [provider]  esomeprazole (NEXIUM) 40 MG capsule Take 1 capsule (40 mg total) by mouth daily as needed. 08/12/19  Yes Paz, Alda Berthold, MD  ferrous sulfate 325 (65 FE) MG tablet Take 325 mg by mouth daily with breakfast.   Yes [provider]  furosemide (LASIX) 40 MG tablet Take 1 tablet (40 mg total) by mouth 2 (two) times daily. 09/17/19  Yes Nita Sells, MD  isosorbide mononitrate (IMDUR) 30 MG 24 hr tablet TAKE 1 TABLET BY MOUTH EVERY DAY Patient taking differently: Take 30 mg by mouth daily.  09/17/19  Yes Troy Sine, MD  magnesium oxide (MAG-OX) 400 MG tablet Take 1 tablet (400 mg total) by mouth daily. 03/15/19  Yes Paz, Alda Berthold, MD  methimazole (TAPAZOLE) 5 MG tablet TAKE 1 TABLET BY MOUTH 3 TIMES A WEEK Patient taking differently: Take 5 mg by mouth every Monday, Wednesday, and Friday.  09/23/18  Yes Paz, Alda Berthold, MD  metoprolol tartrate 75 MG TABS Take 75 mg by mouth 2 (two) times daily. 09/17/19  Yes Nita Sells, MD  nitroGLYCERIN (NITROSTAT) 0.4 MG SL tablet Place 1 tablet (0.4 mg total) under the tongue every 5 (five) minutes x 3 doses as needed for chest pain. 05/25/19  Yes Paz, Alda Berthold, MD  potassium chloride (KLOR-CON M10) 10 MEQ tablet Take 1  tablet (10 mEq total) by mouth daily. 07/09/19  Yes Paz, Alda Berthold, MD  QUEtiapine (SEROQUEL) 25 MG tablet Take 1 tablet (25 mg total) by mouth at bedtime. Patient taking differently: Take 25 mg by mouth at bedtime as needed (Sleep).  08/16/19  Yes Colon Branch, MD    Scheduled Meds: .  stroke: mapping our early stages of recovery book   Does not apply Once  . aspirin EC  81 mg Oral Daily  . atorvastatin  80 mg Oral Daily  . budesonide  0.5 mg Nebulization Daily  . clopidogrel  75 mg Oral Daily  . furosemide  40 mg Oral BID  . insulin aspart  0-15 Units Subcutaneous TID AC & HS  . isosorbide mononitrate  30 mg Oral Daily  . methimazole  5 mg Oral Q M,W,F  . metoprolol tartrate  75 mg Oral BID  . pantoprazole (PROTONIX) IV  40 mg Intravenous Q24H  . QUEtiapine  25 mg Oral QHS   Infusions:  PRN Meds: acetaminophen **OR** acetaminophen (TYLENOL) oral liquid 160 mg/5 mL **OR** acetaminophen, albuterol, nitroGLYCERIN   Allergies as of 09/21/2019 - Review Complete 09/21/2019  Allergen Reaction Noted  . Hydrocodone Itching and Nausea Only 11/16/2008  . Tramadol hcl Itching and Nausea Only 12/18/2009    Family History  Problem Relation Age of Onset  . Heart attack Father   . Coronary artery disease Son 31       deceased  . Hypertension Brother   . Stroke Brother   . Colon cancer Neg Hx   . Breast cancer Neg Hx   . Thyroid disease Neg Hx     Social History   Socioeconomic History  . Marital status: Divorced    Spouse name: Not on file  . Number of children: 4  . Years of education: Not on file  . Highest education level: Not on file  Occupational History  . Occupation: retired     Fish farm manager: RETIRED  Tobacco Use  . Smoking status: Former Research scientist (life sciences)  . Smokeless tobacco: Never Used  . Tobacco comment: quit 2004, smoked 1.5 ppd  Substance and Sexual Activity  . Alcohol use: No    Alcohol/week: 0.0 standard drinks  . Drug use: No  . Sexual activity: Not Currently  Other Topics  Concern  . Not on file  Social History Narrative   Had to moved w/ [name]  ~ 01-2016   Had 3 daughter- 2 son  (lost oldest daughter and son), 2 living daughters in [place] Determinants of Health   Financial Resource Strain: Low Risk   . Difficulty of Paying Living Expenses: Not hard at all  Food Insecurity: No Food Insecurity  . Worried About Charity fundraiser in the Last Year: Never true  . Ran Out of Food in the Last Year: Never true  Transportation Needs: No Transportation Needs  . Lack of Transportation (Medical): No  . Lack of Transportation (Non-Medical): No  Physical Activity: Inactive  . Days of Exercise per Week: 0 days  . Minutes of Exercise per Session: 0 min  Stress:   . Feeling of Stress :   Social Connections:   . Frequency of Communication with Friends and Family:   . Frequency of Social Gatherings with Friends and Family:   . Attends Religious Services:   . Active Member of Clubs or Organizations:   . Attends Archivist Meetings:   Marland Kitchen Marital Status:   Intimate Partner Violence:   . Fear of Current or Ex-Partner:   . Emotionally Abused:   Marland Kitchen Physically Abused:   . Sexually Abused:     REVIEW OF SYSTEMS: Constitutional: Weakness acutely starting Monday. ENT:  No nose bleeds Pulm: No trouble breathing.  No cough. CV:  No palpitations, no LE edema.  No chest pain GU:  No hematuria, no frequency GI: See HPI.  She was eating solid as of yesterday morning.  Currently  n.p.o. Heme: No unusual bleeding or bruising. Transfusions: See HPI. Neuro:  No headaches, no peripheral tingling or numbness Derm:  No itching, no rash or sores.  Endocrine:  No sweats or chills.  No polyuria or dysuria Immunization: Not queried. Travel:  None beyond local counties in last few months.    PHYSICAL EXAM: Vital signs in last 24 hours: Vitals:   09/22/19 0600 09/22/19 0700  BP: (!) 140/47 (!) 133/49  Pulse: 86 94  Resp: (!) 23 (!) 24  Temp:    SpO2:  100% 98%   Wt Readings from Last 3 Encounters:  09/21/19 73.9 kg  09/17/19 73.9 kg  09/09/19 79.8 kg    General: Pleasant, calm, laying on stretcher.  Does not look acutely ill.  Looks younger than stated age of 53.  Daughter is at the bedside. Head: No facial asymmetry or swelling.  No signs of head trauma. Eyes: No scleral icterus.  No conjunctival pallor.  EOMI. Ears: Not hard of hearing. Nose: No discharge Mouth: Oral mucosa moist, pink, clear.  Tongue midline. Neck: No JVD, no masses, no thyromegaly Lungs: Clear bilaterally.  No labored breathing, no cough. Heart: RRR.  No MRG.  S1, S2 present. Abdomen: Soft.  Not tender, not distended, slightly obese.  No organomegaly, bruits, hernias..   Rectal: Hard stool in vault, scant specimen consisting of specks of dark but not melenic or bloody stool is FOBT negative. Musc/Skeltl: No joint swelling or redness. Extremities: No CCE. Neurologic: Alert.  Speech is a bit garbled.  Responds appropriately to simple questions and commands.  Oriented to her name.  Unable to tell us where she is, the year, her date of birth.  Does follow commands.  Upper extremity strength grossly full.  Did not test lower extremity strength. Skin: No rash, no sores, no suspicious lesions. Tattoos: None observed Nodes: No cervical adenopathy Psych: Cooperative, calm.  Intake/Output from previous day: No intake/output data recorded. Intake/Output this shift: No intake/output data recorded.  LAB RESULTS: Recent Labs    09/21/19 1658 09/21/19 1723  WBC  --  7.4  HGB 13.6 11.6*  HCT 40.0 39.4  PLT  --  252   BMET Lab Results  Component Value Date   NA 139 09/22/2019   NA 139 09/21/2019   NA 138 09/21/2019   K 3.8 09/22/2019   K 5.4 (H) 09/21/2019   K 3.7 09/21/2019   CL 97 (L) 09/22/2019   CL 97 (L) 09/21/2019   CL 99 09/21/2019   CO2 28 09/22/2019   CO2 29 09/21/2019   CO2 33 (H) 09/17/2019   GLUCOSE 114 (H) 09/22/2019   GLUCOSE 109 (H)  09/21/2019   GLUCOSE 112 (H) 09/21/2019   BUN 13 09/22/2019   BUN 13 09/21/2019   BUN 15 09/21/2019   CREATININE 0.93 09/22/2019   CREATININE 1.03 (H) 09/21/2019   CREATININE 0.90 09/21/2019   CALCIUM 8.9 09/22/2019   CALCIUM 8.8 (L) 09/21/2019   CALCIUM 9.2 09/17/2019   LFT Recent Labs    09/21/19 1723 09/22/19 0546  PROT 6.8  --   ALBUMIN 3.2* 3.1*  AST 80*  --   ALT 35  --   ALKPHOS 80  --   BILITOT 1.2  --    PT/INR Lab Results  Component Value Date   INR 1.2 09/21/2019   INR 1.4 (H) 05/22/2019   INR 1.39 06/21/2017   Hepatitis Panel No results for input(s): HEPBSAG, HCVAB, HEPAIGM, HEPBIGM in the last 72 hours. C-Diff  No components found for: CDIFF Lipase     Component Value Date/Time   LIPASE 25 08/09/2019 1350    Drugs of Abuse     Component Value Date/Time   LABOPIA NONE DETECTED 03/05/2016 1557   COCAINSCRNUR NONE DETECTED 03/05/2016 1557   LABBENZ NONE DETECTED 03/05/2016 1557   AMPHETMU NONE DETECTED 03/05/2016 1557   THCU NONE DETECTED 03/05/2016 1557   LABBARB NONE DETECTED 03/05/2016 1557     RADIOLOGY STUDIES: MR ANGIO HEAD WO CONTRAST  Result Date: 09/21/2019 CLINICAL DATA:  Acute neurologic deficit EXAM: MRI HEAD WITHOUT CONTRAST MRA HEAD WITHOUT CONTRAST TECHNIQUE: Multiplanar, multiecho pulse sequences of the brain and surrounding structures were obtained without intravenous contrast. Angiographic images of the head were obtained using MRA technique without contrast. COMPARISON:  Brain MRI 05/22/2019 Head CT 09/21/2019 FINDINGS: MRI HEAD FINDINGS Brain: There is an acute infarct of the right superior cerebellar artery territory. No other acute ischemia. No hemorrhage or mass effect. Old bilateral frontal and right parietal infarct. Diffuse generalized atrophy. Diffuse confluent hyperintense T2-weighted signal within the periventricular, deep and juxtacortical white matter, most commonly due to chronic ischemic microangiopathy. fewer than 5  scattered microhemorrhages in a nonspecific pattern. Normal midline structures. Vascular: See discussion below Skull and upper cervical spine: Normal marrow signal. Sinuses/Orbits: Negative. Other: None. MRA HEAD FINDINGS POSTERIOR CIRCULATION: --Vertebral arteries: Right dominant V4 segments. Diminutive left V4. --Posterior inferior cerebellar arteries (PICA): Patent origins from the vertebral arteries. --Anterior inferior cerebellar arteries (AICA): Patent origins from the basilar artery. --Basilar artery: Normal. --Superior cerebellar arteries: Occluded on the right. Normal left. --Posterior cerebral arteries: Normal. Both originate from the basilar artery. Posterior communicating arteries (p-comm) are diminutive or absent. ANTERIOR CIRCULATION: --Intracranial internal carotid arteries: Moderate stenosis of the distal cavernous segment of the right ICA. Normal left. --Anterior cerebral arteries (ACA): Normal. Both A1 segments are present. Patent anterior communicating artery (a-comm). --Middle cerebral arteries (MCA): Normal. IMPRESSION: 1. Acute infarct of the right superior cerebellar artery territory. No hemorrhage or mass effect. 2. Right superior cerebellar artery occlusion. 3. Moderate stenosis of the right internal carotid artery distal cavernous segment. 4. Severe chronic ischemic microangiopathy, multiple old infarcts and generalized atrophy. Electronically Signed   By: Ulyses Jarred M.D.   On: 09/21/2019 22:50   MR BRAIN WO CONTRAST  Result Date: 09/21/2019 CLINICAL DATA:  Acute neurologic deficit EXAM: MRI HEAD WITHOUT CONTRAST MRA HEAD WITHOUT CONTRAST TECHNIQUE: Multiplanar, multiecho pulse sequences of the brain and surrounding structures were obtained without intravenous contrast. Angiographic images of the head were obtained using MRA technique without contrast. COMPARISON:  Brain MRI 05/22/2019 Head CT 09/21/2019 FINDINGS: MRI HEAD FINDINGS Brain: There is an acute infarct of the right  superior cerebellar artery territory. No other acute ischemia. No hemorrhage or mass effect. Old bilateral frontal and right parietal infarct. Diffuse generalized atrophy. Diffuse confluent hyperintense T2-weighted signal within the periventricular, deep and juxtacortical white matter, most commonly due to chronic ischemic microangiopathy. fewer than 5 scattered microhemorrhages in a nonspecific pattern. Normal midline structures. Vascular: See discussion below Skull and upper cervical spine: Normal marrow signal. Sinuses/Orbits: Negative. Other: None. MRA HEAD FINDINGS POSTERIOR CIRCULATION: --Vertebral arteries: Right dominant V4 segments. Diminutive left V4. --Posterior inferior cerebellar arteries (PICA): Patent origins from the vertebral arteries. --Anterior inferior cerebellar arteries (AICA): Patent origins from the basilar artery. --Basilar artery: Normal. --Superior cerebellar arteries: Occluded on the right. Normal left. --Posterior cerebral arteries: Normal. Both originate from the basilar artery. Posterior communicating arteries (p-comm) are diminutive or  absent. ANTERIOR CIRCULATION: --Intracranial internal carotid arteries: Moderate stenosis of the distal cavernous segment of the right ICA. Normal left. --Anterior cerebral arteries (ACA): Normal. Both A1 segments are present. Patent anterior communicating artery (a-comm). --Middle cerebral arteries (MCA): Normal. IMPRESSION: 1. Acute infarct of the right superior cerebellar artery territory. No hemorrhage or mass effect. 2. Right superior cerebellar artery occlusion. 3. Moderate stenosis of the right internal carotid artery distal cavernous segment. 4. Severe chronic ischemic microangiopathy, multiple old infarcts and generalized atrophy. Electronically Signed   By: Ulyses Jarred M.D.   On: 09/21/2019 22:50   DG CHEST PORT 1 VIEW  Result Date: 09/21/2019 CLINICAL DATA:  Altered mental status EXAM: PORTABLE CHEST 1 VIEW COMPARISON:  09/15/2019  FINDINGS: Cardiac shadow remains enlarged. Postsurgical changes are again seen as are aortic calcifications. The lungs are well aerated without focal infiltrate or sizable effusion. No bony abnormality is noted. IMPRESSION: No acute abnormality noted. Electronically Signed   By: Inez Catalina M.D.   On: 09/21/2019 18:20   CT HEAD CODE STROKE WO CONTRAST  Result Date: 09/21/2019 CLINICAL DATA:  Code stroke. Neuro deficit, acute, stroke suspected. Possible stroke. Weakness, 4/11 EXAM: CT HEAD WITHOUT CONTRAST TECHNIQUE: Contiguous axial images were obtained from the base of the skull through the vertex without intravenous contrast. COMPARISON:  Brain MRI 05/22/2019, noncontrast head CT and CT angiogram head/neck 05/22/2019 FINDINGS: Brain: There is a 4.5 x 2.7 x 3.1 cm focus of abnormal hypodensity within the right cerebellar hemisphere consistent with acute/subacute infarct. Minimal associated mass effect without significant effacement of the fourth ventricle. There may be subtle associated petechial hemorrhage without frank hemorrhagic conversion. Redemonstrated chronic cortically based infarcts involving the right frontal and parietal lobes as well as well as cortical/subcortical left frontal lobe and left insula. Redemonstrated small chronic cortically based infarct within the right occipital lobe. No new acute supratentorial infarct is identified. No evidence of intracranial mass. No midline shift or extra-axial fluid collection. Stable background moderate/advanced chronic small vessel ischemic disease within the cerebral white matter. Stable, moderate generalized parenchymal atrophy. Vascular: No hyperdense vessel. Skull: Normal. Negative for fracture or focal lesion. Sinuses/Orbits: Visualized orbits demonstrate no acute abnormality. Minimal ethmoid sinus mucosal thickening. No significant mastoid effusion. These results were communicated to Dr. Leonel Ramsay At 5:10 pmon 4/13/2021by text page via the [facility]  messaging system. IMPRESSION: 4.5 cm acute/subacute infarct within the right cerebellar hemisphere. Minimal associated mass effect without significant fourth ventricular effacement. There may be subtle associated petechial hemorrhage without frank hemorrhagic conversion. Redemonstrated chronic cortically based infarcts within the bilateral cerebral hemispheres. Stable background moderate/advanced chronic small vessel ischemic disease and moderate generalized parenchymal atrophy. Electronically Signed   By: Kellie Simmering DO   On: 09/21/2019 17:12     IMPRESSION:   *   Acute CVA in pt w prior CVA, TIA.  Off Eliquis as of early 08/2019 due to GIB  *   GIB requiring PRBC x 1 in early 08/2019.  Ablation of bleeding cecal AVM 2015.  MD felt pt high risk for GI procedures.    *   Multiple medical issues: advanced dementia, CHF w recent flares, s/p tissueAVR 1998, afib,  SSS s/p pacemaker, severe tricuspid regurge.         PLAN:     *   Continue Plavix for now.  Would watch her for at least a week on Plavix alone before considering starting Eliquis.  Either Plavix or Eliquis is likely to exacerbate her problem with GI bleeding,  anemia. Dr. Rush Landmark will follow while inpatient this week.   Azucena Freed  09/22/2019, 8:27 AM Phone [phone]

## 2019-09-22 NOTE — Procedures (Signed)
Patient Name: YEWANDE PEYER  MRN: [id number]  Epilepsy Attending: Lora Havens  Referring Physician/Provider: Dr. Kathrynn Speed Date: 09/21/2019 Duration: 23.18 mins  Patient history: 86 year old female presented with left gaze preference and altered mental status.  EEG to evaluate for seizures.  Level of alertness: Awake  AEDs during EEG study: None  Technical aspects: This EEG study was done with scalp electrodes positioned according to the 10-20 International system of electrode placement. Electrical activity was acquired at a sampling rate of 500Hz  and reviewed with a high frequency filter of 70Hz  and a low frequency filter of 1Hz . EEG data were recorded continuously and digitally stored.   Description: No clear posterior dominant rhythm was seen.  EEG showed continuous generalized 5 to 6 Hz theta slowing as well as intermittent generalized 2 to 3 Hz delta slowing.  Hyperventilation and photic stimulation were not performed.    Abnormality -Continuous slow, generalized  IMPRESSION: This study is suggestive of moderate diffuse encephalopathy, nonspecific etiology. No seizures or epileptiform discharges were seen throughout the recording.  Jeffry Vogelsang Barbra Sarks

## 2019-09-22 NOTE — Progress Notes (Addendum)
STROKE TEAM PROGRESS NOTE   INTERVAL HISTORY Her dtr is at the bedside. EEG did not show seizures. MRI shows R SCA stroke and CAA. AMS remains, but pt was able to say her dtrs name at bedside today, which is improved.  I have personally reviewed history of presenting illness with the patient and her daughter, electronic medical records and imaging films in PACS.  Vitals:   09/22/19 0945 09/22/19 1030 09/22/19 1115 09/22/19 1200  BP: (!) 142/54 121/74 (!) 120/53 (!) 112/51  Pulse: 86 75 67 64  Resp: (!) 22 (!) 21 (!) 25 20  Temp:      TempSrc:      SpO2: 100% 100% 100% 100%  Weight:      Height:        CBC:  Recent Labs  Lab 09/17/19 0336 09/17/19 0336 09/21/19 1658 09/21/19 1723  WBC 9.1  --   --  7.4  NEUTROABS 6.4  --   --  5.1  HGB 11.8*   < > 13.6 11.6*  HCT 38.9   < > 40.0 39.4  MCV 93.1  --   --  96.1  PLT 258  --   --  252   < > = values in this interval not displayed.    Basic Metabolic Panel:  Recent Labs  Lab 09/21/19 1723 09/22/19 0546  NA 139 139  K 5.4* 3.8  CL 97* 97*  CO2 29 28  GLUCOSE 109* 114*  BUN 13 13  CREATININE 1.03* 0.93  CALCIUM 8.8* 8.9  PHOS  --  4.1   Lipid Panel:     Component Value Date/Time   CHOL 109 09/22/2019 0546   TRIG 54 09/22/2019 0546   HDL 32 (L) 09/22/2019 0546   CHOLHDL 3.4 09/22/2019 0546   VLDL 11 09/22/2019 0546   LDLCALC 66 09/22/2019 0546   HgbA1c:  Lab Results  Component Value Date   HGBA1C 6.1 (H) 09/14/2019   Urine Drug Screen:     Component Value Date/Time   LABOPIA NONE DETECTED 03/05/2016 1557   COCAINSCRNUR NONE DETECTED 03/05/2016 1557   LABBENZ NONE DETECTED 03/05/2016 1557   AMPHETMU NONE DETECTED 03/05/2016 1557   THCU NONE DETECTED 03/05/2016 1557   LABBARB NONE DETECTED 03/05/2016 1557    Alcohol Level     Component Value Date/Time   ETH <5 03/05/2016 1858    IMAGING past 24 hours MR ANGIO HEAD WO CONTRAST  Result Date: 09/21/2019 CLINICAL DATA:  Acute neurologic deficit EXAM:  MRI HEAD WITHOUT CONTRAST MRA HEAD WITHOUT CONTRAST TECHNIQUE: Multiplanar, multiecho pulse sequences of the brain and surrounding structures were obtained without intravenous contrast. Angiographic images of the head were obtained using MRA technique without contrast. COMPARISON:  Brain MRI 05/22/2019 Head CT 09/21/2019 FINDINGS: MRI HEAD FINDINGS Brain: There is an acute infarct of the right superior cerebellar artery territory. No other acute ischemia. No hemorrhage or mass effect. Old bilateral frontal and right parietal infarct. Diffuse generalized atrophy. Diffuse confluent hyperintense T2-weighted signal within the periventricular, deep and juxtacortical white matter, most commonly due to chronic ischemic microangiopathy. fewer than 5 scattered microhemorrhages in a nonspecific pattern. Normal midline structures. Vascular: See discussion below Skull and upper cervical spine: Normal marrow signal. Sinuses/Orbits: Negative. Other: None. MRA HEAD FINDINGS POSTERIOR CIRCULATION: --Vertebral arteries: Right dominant V4 segments. Diminutive left V4. --Posterior inferior cerebellar arteries (PICA): Patent origins from the vertebral arteries. --Anterior inferior cerebellar arteries (AICA): Patent origins from the basilar artery. --Basilar artery: Normal. --Superior cerebellar  arteries: Occluded on the right. Normal left. --Posterior cerebral arteries: Normal. Both originate from the basilar artery. Posterior communicating arteries (p-comm) are diminutive or absent. ANTERIOR CIRCULATION: --Intracranial internal carotid arteries: Moderate stenosis of the distal cavernous segment of the right ICA. Normal left. --Anterior cerebral arteries (ACA): Normal. Both A1 segments are present. Patent anterior communicating artery (a-comm). --Middle cerebral arteries (MCA): Normal. IMPRESSION: 1. Acute infarct of the right superior cerebellar artery territory. No hemorrhage or mass effect. 2. Right superior cerebellar artery  occlusion. 3. Moderate stenosis of the right internal carotid artery distal cavernous segment. 4. Severe chronic ischemic microangiopathy, multiple old infarcts and generalized atrophy. Electronically Signed   By: Ulyses Jarred M.D.   On: 09/21/2019 22:50   MR BRAIN WO CONTRAST  Result Date: 09/21/2019 CLINICAL DATA:  Acute neurologic deficit EXAM: MRI HEAD WITHOUT CONTRAST MRA HEAD WITHOUT CONTRAST TECHNIQUE: Multiplanar, multiecho pulse sequences of the brain and surrounding structures were obtained without intravenous contrast. Angiographic images of the head were obtained using MRA technique without contrast. COMPARISON:  Brain MRI 05/22/2019 Head CT 09/21/2019 FINDINGS: MRI HEAD FINDINGS Brain: There is an acute infarct of the right superior cerebellar artery territory. No other acute ischemia. No hemorrhage or mass effect. Old bilateral frontal and right parietal infarct. Diffuse generalized atrophy. Diffuse confluent hyperintense T2-weighted signal within the periventricular, deep and juxtacortical white matter, most commonly due to chronic ischemic microangiopathy. fewer than 5 scattered microhemorrhages in a nonspecific pattern. Normal midline structures. Vascular: See discussion below Skull and upper cervical spine: Normal marrow signal. Sinuses/Orbits: Negative. Other: None. MRA HEAD FINDINGS POSTERIOR CIRCULATION: --Vertebral arteries: Right dominant V4 segments. Diminutive left V4. --Posterior inferior cerebellar arteries (PICA): Patent origins from the vertebral arteries. --Anterior inferior cerebellar arteries (AICA): Patent origins from the basilar artery. --Basilar artery: Normal. --Superior cerebellar arteries: Occluded on the right. Normal left. --Posterior cerebral arteries: Normal. Both originate from the basilar artery. Posterior communicating arteries (p-comm) are diminutive or absent. ANTERIOR CIRCULATION: --Intracranial internal carotid arteries: Moderate stenosis of the distal cavernous  segment of the right ICA. Normal left. --Anterior cerebral arteries (ACA): Normal. Both A1 segments are present. Patent anterior communicating artery (a-comm). --Middle cerebral arteries (MCA): Normal. IMPRESSION: 1. Acute infarct of the right superior cerebellar artery territory. No hemorrhage or mass effect. 2. Right superior cerebellar artery occlusion. 3. Moderate stenosis of the right internal carotid artery distal cavernous segment. 4. Severe chronic ischemic microangiopathy, multiple old infarcts and generalized atrophy. Electronically Signed   By: Ulyses Jarred M.D.   On: 09/21/2019 22:50   DG CHEST PORT 1 VIEW  Result Date: 09/21/2019 CLINICAL DATA:  Altered mental status EXAM: PORTABLE CHEST 1 VIEW COMPARISON:  09/15/2019 FINDINGS: Cardiac shadow remains enlarged. Postsurgical changes are again seen as are aortic calcifications. The lungs are well aerated without focal infiltrate or sizable effusion. No bony abnormality is noted. IMPRESSION: No acute abnormality noted. Electronically Signed   By: Inez Catalina M.D.   On: 09/21/2019 18:20   EEG adult  Result Date: 09/22/2019 Lora Havens, MD     09/22/2019 10:05 AM Patient Name: Rebecca Skinner MRN: [id number] Epilepsy Attending: Lora Havens Referring Physician/Provider: Dr. Kathrynn Speed Date: 09/21/2019 Duration: 23.18 mins Patient history: 86 year old female presented with left gaze preference and altered mental status.  EEG to evaluate for seizures. Level of alertness: Awake AEDs during EEG study: None Technical aspects: This EEG study was done with scalp electrodes positioned according to the 10-20 International system of electrode placement. Electrical activity was  acquired at a sampling rate of 500Hz  and reviewed with a high frequency filter of 70Hz  and a low frequency filter of 1Hz . EEG data were recorded continuously and digitally stored. Description: No clear posterior dominant rhythm was seen.  EEG showed continuous generalized 5  to 6 Hz theta slowing as well as intermittent generalized 2 to 3 Hz delta slowing.  Hyperventilation and photic stimulation were not performed.  Abnormality -Continuous slow, generalized IMPRESSION: This study is suggestive of moderate diffuse encephalopathy, nonspecific etiology. No seizures or epileptiform discharges were seen throughout the recording. Lora Havens   CT HEAD CODE STROKE WO CONTRAST  Result Date: 09/21/2019 CLINICAL DATA:  Code stroke. Neuro deficit, acute, stroke suspected. Possible stroke. Weakness, 4/11 EXAM: CT HEAD WITHOUT CONTRAST TECHNIQUE: Contiguous axial images were obtained from the base of the skull through the vertex without intravenous contrast. COMPARISON:  Brain MRI 05/22/2019, noncontrast head CT and CT angiogram head/neck 05/22/2019 FINDINGS: Brain: There is a 4.5 x 2.7 x 3.1 cm focus of abnormal hypodensity within the right cerebellar hemisphere consistent with acute/subacute infarct. Minimal associated mass effect without significant effacement of the fourth ventricle. There may be subtle associated petechial hemorrhage without frank hemorrhagic conversion. Redemonstrated chronic cortically based infarcts involving the right frontal and parietal lobes as well as well as cortical/subcortical left frontal lobe and left insula. Redemonstrated small chronic cortically based infarct within the right occipital lobe. No new acute supratentorial infarct is identified. No evidence of intracranial mass. No midline shift or extra-axial fluid collection. Stable background moderate/advanced chronic small vessel ischemic disease within the cerebral white matter. Stable, moderate generalized parenchymal atrophy. Vascular: No hyperdense vessel. Skull: Normal. Negative for fracture or focal lesion. Sinuses/Orbits: Visualized orbits demonstrate no acute abnormality. Minimal ethmoid sinus mucosal thickening. No significant mastoid effusion. These results were communicated to Dr.  Leonel Ramsay At 5:10 pmon 4/13/2021by text page via the [facility] messaging system. IMPRESSION: 4.5 cm acute/subacute infarct within the right cerebellar hemisphere. Minimal associated mass effect without significant fourth ventricular effacement. There may be subtle associated petechial hemorrhage without frank hemorrhagic conversion. Redemonstrated chronic cortically based infarcts within the bilateral cerebral hemispheres. Stable background moderate/advanced chronic small vessel ischemic disease and moderate generalized parenchymal atrophy. Electronically Signed   By: Kellie Simmering DO   On: 09/21/2019 17:12   VAS US CAROTID (at [facility] and [facility] only)  Result Date: 09/22/2019 Carotid Arterial Duplex Study Indications:       CVA. Risk Factors:      Diabetes, coronary artery disease. Comparison Study:  08/31/16 B1-39% Performing Technologist: June Leap RDMS, RVT  Examination Guidelines: A complete evaluation includes B-mode imaging, spectral Doppler, color Doppler, and power Doppler as needed of all accessible portions of each vessel. Bilateral testing is considered an integral part of a complete examination. Limited examinations for reoccurring indications may be performed as noted.  Right Carotid Findings: +----------+--------+--------+--------+------------------+--------+           PSV cm/sEDV cm/sStenosisPlaque DescriptionComments +----------+--------+--------+--------+------------------+--------+ CCA Prox  54      8                                          +----------+--------+--------+--------+------------------+--------+ CCA Distal46      9                                          +----------+--------+--------+--------+------------------+--------+  ICA Prox  48      15      1-39%   heterogenous               +----------+--------+--------+--------+------------------+--------+ ICA Distal68      19                                          +----------+--------+--------+--------+------------------+--------+ ECA       52      4                                          +----------+--------+--------+--------+------------------+--------+ +----------+--------+-------+------------------------------+-------------------+           PSV cm/sEDV cmsDescribe                      Arm Pressure (mmHG) +----------+--------+-------+------------------------------+-------------------+ Subclavian               not visualized due to patient                                              position                                          +----------+--------+-------+------------------------------+-------------------+ +---------+--------+--------+--------------+ VertebralPSV cm/sEDV cm/sNot identified +---------+--------+--------+--------------+  Left Carotid Findings: +----------+--------+--------+--------+------------------+---------------------+           PSV cm/sEDV cm/sStenosisPlaque DescriptionComments              +----------+--------+--------+--------+------------------+---------------------+ CCA Prox                                            Proximal CCA is not                                                       visualized.           +----------+--------+--------+--------+------------------+---------------------+ CCA Distal34      10                                                      +----------+--------+--------+--------+------------------+---------------------+ ICA Prox  87      20      1-39%   heterogenous                            +----------+--------+--------+--------+------------------+---------------------+ ICA Distal                                          Not visualized        +----------+--------+--------+--------+------------------+---------------------+  ECA       68      15                                                       +----------+--------+--------+--------+------------------+---------------------+ +----------+--------+--------+-----------------------------+-------------------+           PSV cm/sEDV cm/sDescribe                     Arm Pressure (mmHG) +----------+--------+--------+-----------------------------+-------------------+ Subclavian                not visualized due to patient                                              position                                         +----------+--------+--------+-----------------------------+-------------------+ +---------+--------+--------+--------------+ VertebralPSV cm/sEDV cm/sNot identified +---------+--------+--------+--------------+ Poorly visualized left side due to patient position/ favoring left side  Summary: Right Carotid: Velocities in the right ICA are consistent with a 1-39% stenosis. Left Carotid: Velocities in the left ICA are consistent with a 1-39% stenosis.  *See table(s) above for measurements and observations.     Preliminary    EEG: IMPRESSION: This study is suggestive of moderate diffuse encephalopathy, nonspecific etiology. No seizures or epileptiform discharges were seen throughout the recording.  PHYSICAL EXAM Constitutional: Frail and cachectic.  Eyes: No scleral injection HENT: No OP obstrucion Head: Normocephalic.  Cardiovascular: Palpable Respiratory: Effort normal, non-labored breathing GI: Soft. No distension. There is no tenderness.  Skin: WDI  Neuro: Mental Status: Patient is awake, alert, able to follow simple commands such as closing her eyes, unable to do complex commands. She showed mild dysarthria and echolalia no matter which statement was asked, even her name.  Cranial Nerves: II: Right hemianopsia, decreased blink to threat, unable to count III,IV, VI: Left gaze preference however is able to mildly cross midline to the right. Pupils equal, round and reactive to light V: Facial sensation is  symmetric to temperature VII: Left nasolabial fold decrease VIII: hearing is intact to voice X: Palat elevates symmetrically XI: Shoulder shrug is symmetric. XII: tongue is midline without atrophy or fasciculations.  Motor: Moving all extremities antigravity and showed no drift and bilateral upper and lower extremities. Sensory: Very difficult to assess as patient was unreliable in her answers Deep Tendon Reflexes: 2+ and symmetric in the biceps and patellae.  Plantars: Toes are downgoing bilaterally.  Cerebellar: No dysmetria noted with upper extremities could not obtain lower extremities  ASSESSMENT/PLAN Ms. HIILEI NED is a 86 y.o. female presenting with left gaze and AMS, speech changes per dtr. She does have Afib, but patient was taken off her Eliquis approximately 3 to 6 weeks ago secondary to GI bleed.    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  Code Stroke CT: 4.5 cm acute/subacute infarct in the right cerebellar hemisphere.    MRI  R SCA stroke  MRA mod stenosis of RICA, occlusion of RSCA  Carotid Doppler 1-39% bliat  2D Echo no need to repeat; just  done last admit  LDL 66  HgbA1c 6.1  SCDs for VTE prophylaxis    Diet   Diet NPO time specified     ASA for now (was taken off Eliquis ~3wk ago d/t GIB) prior to admission, now on ASA  Therapy recommendations:  pending  Disposition: pending  Hypertension  Home meds: Imdur, Metoprolol  Stable . Permissive hypertension (OK if < 220/120) but gradually normalize in 5-7 days . Long-term BP goal normotensive  Hyperlipidemia   LDL 66, goal < 70  Continue home dose of 40mg  Lipitor, no reason to increase   Continue statin at discharge   No dx of Diabetes   HgbA1c 6.1, goal < 7.0  CBGs Recent Labs    09/21/19 1650 09/21/19 2046 09/22/19 0730  GLUCAP 107* 125* 130*      SSI  Other Stroke Risk Factors  Advanced age  Hx stroke/TIA  Coronary artery  disease  Congestive heart failure  Other Active Problems  Dementia, CAA seen on MRI. This is an absolute contra-indication for [place]. Will continue with ASA only for secondary stroke prevention.   Very advanced age, debility  Hospital day # 1  Desiree Metzger-Cihelka, ARNP-C, ANVP-BC Pager: [phone]  I have personally obtained history,examined this patient, reviewed notes, independently viewed imaging studies, participated in medical decision making and plan of care.ROS completed by me personally and pertinent positives fully documented  I have made any additions or clarifications directly to the above note. Agree with note above.  She has presented with embolic right superior cerebellar artery infarct and has atrial fibrillation but is not on anticoagulation due to increased risk of bleeding from her amyloid angiopathy and dementia.  Continue aspirin for stroke prevention and aggressive risk factor modification.  Long discussion with patient and daughter and answered questions.  Discussed with Dr. Reesa Chew.  Greater than 50% time during this 35-minute visit was spent in counseling and coordination of care and discussion with patient and family and care team and answering questions stroke team will sign off.  Kindly call for questions.  Antony Contras, MD Medical Director [facility] Pager: [phone] 09/22/2019 1:54 PM  To contact Stroke Continuity provider, please refer to [email or web address] After hours, contact General Neurology

## 2019-09-22 NOTE — Progress Notes (Signed)
  Echocardiogram 2D Echocardiogram has been performed.  Rebecca Skinner 09/22/2019, 1:13 PM

## 2019-09-22 NOTE — Progress Notes (Signed)
Patient arrived in the unit at 2015 pm from [facility] in a stretcher, alert and oriented x1-2 period of confusion, initiated telemetry monitor, resting in a bed at this moment and will continue to monitor closely.

## 2019-09-23 ENCOUNTER — Ambulatory Visit: Payer: Self-pay | Admitting: *Deleted

## 2019-09-23 ENCOUNTER — Telehealth: Payer: Self-pay | Admitting: Internal Medicine

## 2019-09-23 ENCOUNTER — Other Ambulatory Visit: Payer: Self-pay | Admitting: *Deleted

## 2019-09-23 DIAGNOSIS — I10 Essential (primary) hypertension: Secondary | ICD-10-CM | POA: Diagnosis not present

## 2019-09-23 DIAGNOSIS — E782 Mixed hyperlipidemia: Secondary | ICD-10-CM | POA: Diagnosis not present

## 2019-09-23 DIAGNOSIS — I482 Chronic atrial fibrillation, unspecified: Secondary | ICD-10-CM | POA: Diagnosis not present

## 2019-09-23 LAB — GLUCOSE, CAPILLARY
Glucose-Capillary: 106 mg/dL — ABNORMAL HIGH (ref 70–99)
Glucose-Capillary: 109 mg/dL — ABNORMAL HIGH (ref 70–99)
Glucose-Capillary: 101 mg/dL — ABNORMAL HIGH (ref 70–99)
Glucose-Capillary: 98 mg/dL (ref 70–99)

## 2019-09-23 LAB — HEMOGLOBIN A1C
Hgb A1c MFr Bld: 6.1 % — ABNORMAL HIGH (ref 4.8–5.6)
Mean Plasma Glucose: 128 mg/dL

## 2019-09-23 MED ORDER — ONDANSETRON HCL 4 MG/2ML IJ SOLN
4.0000 mg | Freq: Four times a day (QID) | INTRAMUSCULAR | Status: DC | PRN
  Administered 2019-09-23: 4 mg via INTRAVENOUS

## 2019-09-23 MED ORDER — PANTOPRAZOLE SODIUM 40 MG IV SOLR
40.0000 mg | Freq: Two times a day (BID) | INTRAVENOUS | Status: DC
  Administered 2019-09-23 – 2019-09-24 (×3): 40 mg via INTRAVENOUS
  Filled 2019-09-23 (×2): qty 40

## 2019-09-23 MED ORDER — IPRATROPIUM-ALBUTEROL 0.5-2.5 (3) MG/3ML IN SOLN: 3.0000 mL | Freq: Three times a day (TID) | RESPIRATORY_TRACT | Status: DC

## 2019-09-23 NOTE — Progress Notes (Signed)
PROGRESS NOTE    Rebecca Skinner  [id number] DOB: 03-Jan-1923 DOA: 09/21/2019 PCP: Colon Branch, MD   Brief Narrative:  86 year old with history of chronic A. fib, CVA, TIA, DM2, GERD, recent GI bleeding conservatively managed, CAD, Graves' disease, HLD found to have dysarthria and confusion.  Eliquis recently discontinued due to GI bleeding.  Diagnosed with acute 4.5 cm right cerebellar infarct.  Neurology consulted and patient admitted to the hospital.  Given limited options for anticoagulation, neurology recommending aspirin enteric-coated 225 mg daily.  After goals of care discussion family has opted for home hospice.  I have consulted hospice team.   Assessment & Plan:   Active Problems:   Diabetes mellitus type II, controlled ([facility])   Mixed hyperlipidemia   HTN (hypertension)   ATRIAL FIBRILLATION, chronic   Cerebellar stroke, acute (HCC)   Chronic diastolic heart failure (HCC)   Hyperkalemia   Chronic respiratory failure with hypoxia ([facility])  Acute 4.5 cm cerebellar CVA, right-sided -Patient seen by neurology.  Started on antiplatelet therapy. -Neurology recommending aspirin enteric-coated 325 mg daily. No need for Plavix or Eliquis -Lipitor 80 mg daily -PT/OT -Appreciate neurology input -MRI brain-acute right superior cerebellar CVA, right superior cerebellar artery occlusion, moderate stenosis of the right ICA.  Other old chronic ischemic infarcts Carotid Dopplers 1-39% b/l  Acute metabolic encephalopathy, drowsiness -No focal neuro deficits.  Answers basic questions.  Will discontinue Seroquel and monitor her.  Diabetes mellitus type 2 -Hemoglobin A1c 6.1 -Not on any home medication -Sliding scale Accu-Chek  Essential hypertension -Continue metoprolol.  Otherwise permissive hypertension  Chronic atrial fibrillation -Eliquis recently discontinued due to GI bleeding -Enteric-coated full dose aspirin  Congestive heart failure with preserved ejection fraction -Euvolemic  in nature.  Resume home meds  Chronic hypoxic respiratory failure, 2 L nasal cannula -Continue supplemental oxygen.  Supportive care  Hyperlipidemia -Lipitor 80 mg daily  Goals of care discussion-had extensive discussion with both the daughters today.  Agreeable for home hospice.  I have consulted hospice team.   DVT prophylaxis: Aspirin Plavix, otherwise SCDs Code Status: DNR Family Communication: Spoke with both the daughters today, agreeable to home hospice Disposition Plan:   Patient From= home  Patient Anticipated D/C place= Home  Barriers= awaiting her mentation to improve slightly.  Transition to hospice at home over next 24 hours.  Subjective: Laying in the bed quite drowsy.  Daughter tells me whenever she gets her Seroquel she gets drowsy the next day.  I discussed with the daughter to discontinue the medications.  Daughter is agreeable.  Also family is agreeable for home hospice.  Review of Systems Otherwise negative except as per HPI, including: Difficult to assess  Examination:  Constitutional: Elderly frail Respiratory: Mild coarse breath sounds anteriorly Cardiovascular: Normal sinus rhythm, no rubs Abdomen: Nontender nondistended good bowel sounds Musculoskeletal: No edema noted Skin: No rashes seen Neurologic: No focal neuro deficits.  She is overall drowsy. Psychiatric: Poor judgment and insight   Objective: Vitals:   09/23/19 0300 09/23/19 0809 09/23/19 0826 09/23/19 0936  BP: (!) 145/63 125/75    Pulse: 68 63 84   Resp: 18 18 (!) 22   Temp: 97.9 F (36.6 C) (!) 97.5 F (36.4 C)    TempSrc: Oral Oral    SpO2: 100% 100% 98% 91%  Weight:      Height:        Intake/Output Summary (Last 24 hours) at 09/23/2019 1214 Last data filed at 09/23/2019 1117 Gross per 24 hour  Intake 120  ml  Output 350 ml  Net -230 ml   Filed Weights   09/21/19 1708  Weight: 73.9 kg     Data Reviewed:   CBC: Recent Labs  Lab 09/17/19 0336 09/21/19 1658  09/21/19 1723  WBC 9.1  --  7.4  NEUTROABS 6.4  --  5.1  HGB 11.8* 13.6 11.6*  HCT 38.9 40.0 39.4  MCV 93.1  --  96.1  PLT 258  --  [id number]   Basic Metabolic Panel: Recent Labs  Lab 09/17/19 0336 09/21/19 1658 09/21/19 1723 09/22/19 0546  NA 138 138 139 139  K 4.1 3.7 5.4* 3.8  CL 92* 99 97* 97*  CO2 33*  --  29 28  GLUCOSE 135* 112* 109* 114*  BUN 10 15 13 13   CREATININE 0.92 0.90 1.03* 0.93  CALCIUM 9.2  --  8.8* 8.9  PHOS  --   --   --  4.1   GFR: Estimated Creatinine Clearance: 35.6 mL/min (by C-G formula based on SCr of 0.93 mg/dL). Liver Function Tests: Recent Labs  Lab 09/21/19 1723 09/22/19 0546  AST 80*  --   ALT 35  --   ALKPHOS 80  --   BILITOT 1.2  --   PROT 6.8  --   ALBUMIN 3.2* 3.1*   No results for input(s): LIPASE, AMYLASE in the last 168 hours. Recent Labs  Lab 09/21/19 1818  AMMONIA 48*   Coagulation Profile: Recent Labs  Lab 09/21/19 1818  INR 1.2   Cardiac Enzymes: No results for input(s): CKTOTAL, CKMB, CKMBINDEX, TROPONINI in the last 168 hours. BNP (last 3 results) No results for input(s): PROBNP in the last 8760 hours. HbA1C: Recent Labs    09/22/19 0546  HGBA1C 6.1*   CBG: Recent Labs  Lab 09/22/19 1236 09/22/19 1848 09/22/19 1931 09/22/19 2230 09/23/19 0906  GLUCAP 105* 143* 160* 105* 106*   Lipid Profile: Recent Labs    09/22/19 0546  CHOL 109  HDL 32*  LDLCALC 66  TRIG 54  CHOLHDL 3.4   Thyroid Function Tests: Recent Labs    09/21/19 1818  TSH 1.255   Anemia Panel: Recent Labs    09/21/19 1818  VITAMINB12 782  FOLATE 13.3   Sepsis Labs: No results for input(s): PROCALCITON, LATICACIDVEN in the last 168 hours.  Recent Results (from the past 240 hour(s))  SARS CORONAVIRUS 2 (TAT 6-24 HRS) Nasopharyngeal Nasopharyngeal Swab     Status: None   Collection Time: 09/13/19 10:41 PM   Specimen: Nasopharyngeal Swab  Result Value Ref Range Status   SARS Coronavirus 2 NEGATIVE NEGATIVE Final     Comment: (NOTE) SARS-CoV-2 target nucleic acids are NOT DETECTED. The SARS-CoV-2 RNA is generally detectable in upper and lower respiratory specimens during the acute phase of infection. Negative results do not preclude SARS-CoV-2 infection, do not rule out co-infections with other pathogens, and should not be used as the sole basis for treatment or other patient management decisions. Negative results must be combined with clinical observations, patient history, and epidemiological information. The expected result is Negative. Fact Sheet for Patients: SugarRoll.be Fact Sheet for Healthcare Providers: https://www.woods-mathews.com/ This test is not yet approved or cleared by the [place] FDA and  has been authorized for detection and/or diagnosis of SARS-CoV-2 by FDA under an Emergency Use Authorization (EUA). This EUA will remain  in effect (meaning this test can be used) for the duration of the COVID-19 declaration under Section 56 4(b)(1) of the Act, 21 U.S.C. section 360bbb-3(b)(1),  unless the authorization is terminated or revoked sooner. Performed at [facility], [place] [address], [place], [place] [address]   SARS CORONAVIRUS 2 (TAT 6-24 HRS) Nasopharyngeal Nasopharyngeal Swab     Status: None   Collection Time: 09/22/19  4:28 AM   Specimen: Nasopharyngeal Swab  Result Value Ref Range Status   SARS Coronavirus 2 NEGATIVE NEGATIVE Final    Comment: (NOTE) SARS-CoV-2 target nucleic acids are NOT DETECTED. The SARS-CoV-2 RNA is generally detectable in upper and lower respiratory specimens during the acute phase of infection. Negative results do not preclude SARS-CoV-2 infection, do not rule out co-infections with other pathogens, and should not be used as the sole basis for treatment or other patient management decisions. Negative results must be combined with clinical observations, patient history, and epidemiological  information. The expected result is Negative. Fact Sheet for Patients: SugarRoll.be Fact Sheet for Healthcare Providers: https://www.woods-mathews.com/ This test is not yet approved or cleared by the [place] FDA and  has been authorized for detection and/or diagnosis of SARS-CoV-2 by FDA under an Emergency Use Authorization (EUA). This EUA will remain  in effect (meaning this test can be used) for the duration of the COVID-19 declaration under Section 56 4(b)(1) of the Act, 21 U.S.C. section 360bbb-3(b)(1), unless the authorization is terminated or revoked sooner. Performed at [facility], [place] [address] [place], [place] [address]          Radiology Studies: MR ANGIO HEAD WO CONTRAST  Result Date: 09/21/2019 CLINICAL DATA:  Acute neurologic deficit EXAM: MRI HEAD WITHOUT CONTRAST MRA HEAD WITHOUT CONTRAST TECHNIQUE: Multiplanar, multiecho pulse sequences of the brain and surrounding structures were obtained without intravenous contrast. Angiographic images of the head were obtained using MRA technique without contrast. COMPARISON:  Brain MRI 05/22/2019 Head CT 09/21/2019 FINDINGS: MRI HEAD FINDINGS Brain: There is an acute infarct of the right superior cerebellar artery territory. No other acute ischemia. No hemorrhage or mass effect. Old bilateral frontal and right parietal infarct. Diffuse generalized atrophy. Diffuse confluent hyperintense T2-weighted signal within the periventricular, deep and juxtacortical white matter, most commonly due to chronic ischemic microangiopathy. fewer than 5 scattered microhemorrhages in a nonspecific pattern. Normal midline structures. Vascular: See discussion below Skull and upper cervical spine: Normal marrow signal. Sinuses/Orbits: Negative. Other: None. MRA HEAD FINDINGS POSTERIOR CIRCULATION: --Vertebral arteries: Right dominant V4 segments. Diminutive left V4. --Posterior inferior cerebellar  arteries (PICA): Patent origins from the vertebral arteries. --Anterior inferior cerebellar arteries (AICA): Patent origins from the basilar artery. --Basilar artery: Normal. --Superior cerebellar arteries: Occluded on the right. Normal left. --Posterior cerebral arteries: Normal. Both originate from the basilar artery. Posterior communicating arteries (p-comm) are diminutive or absent. ANTERIOR CIRCULATION: --Intracranial internal carotid arteries: Moderate stenosis of the distal cavernous segment of the right ICA. Normal left. --Anterior cerebral arteries (ACA): Normal. Both A1 segments are present. Patent anterior communicating artery (a-comm). --Middle cerebral arteries (MCA): Normal. IMPRESSION: 1. Acute infarct of the right superior cerebellar artery territory. No hemorrhage or mass effect. 2. Right superior cerebellar artery occlusion. 3. Moderate stenosis of the right internal carotid artery distal cavernous segment. 4. Severe chronic ischemic microangiopathy, multiple old infarcts and generalized atrophy. Electronically Signed   By: Ulyses Jarred M.D.   On: 09/21/2019 22:50   MR BRAIN WO CONTRAST  Result Date: 09/21/2019 CLINICAL DATA:  Acute neurologic deficit EXAM: MRI HEAD WITHOUT CONTRAST MRA HEAD WITHOUT CONTRAST TECHNIQUE: Multiplanar, multiecho pulse sequences of the brain and surrounding structures were obtained without intravenous  contrast. Angiographic images of the head were obtained using MRA technique without contrast. COMPARISON:  Brain MRI 05/22/2019 Head CT 09/21/2019 FINDINGS: MRI HEAD FINDINGS Brain: There is an acute infarct of the right superior cerebellar artery territory. No other acute ischemia. No hemorrhage or mass effect. Old bilateral frontal and right parietal infarct. Diffuse generalized atrophy. Diffuse confluent hyperintense T2-weighted signal within the periventricular, deep and juxtacortical white matter, most commonly due to chronic ischemic microangiopathy. fewer than  5 scattered microhemorrhages in a nonspecific pattern. Normal midline structures. Vascular: See discussion below Skull and upper cervical spine: Normal marrow signal. Sinuses/Orbits: Negative. Other: None. MRA HEAD FINDINGS POSTERIOR CIRCULATION: --Vertebral arteries: Right dominant V4 segments. Diminutive left V4. --Posterior inferior cerebellar arteries (PICA): Patent origins from the vertebral arteries. --Anterior inferior cerebellar arteries (AICA): Patent origins from the basilar artery. --Basilar artery: Normal. --Superior cerebellar arteries: Occluded on the right. Normal left. --Posterior cerebral arteries: Normal. Both originate from the basilar artery. Posterior communicating arteries (p-comm) are diminutive or absent. ANTERIOR CIRCULATION: --Intracranial internal carotid arteries: Moderate stenosis of the distal cavernous segment of the right ICA. Normal left. --Anterior cerebral arteries (ACA): Normal. Both A1 segments are present. Patent anterior communicating artery (a-comm). --Middle cerebral arteries (MCA): Normal. IMPRESSION: 1. Acute infarct of the right superior cerebellar artery territory. No hemorrhage or mass effect. 2. Right superior cerebellar artery occlusion. 3. Moderate stenosis of the right internal carotid artery distal cavernous segment. 4. Severe chronic ischemic microangiopathy, multiple old infarcts and generalized atrophy. Electronically Signed   By: Ulyses Jarred M.D.   On: 09/21/2019 22:50   DG CHEST PORT 1 VIEW  Result Date: 09/21/2019 CLINICAL DATA:  Altered mental status EXAM: PORTABLE CHEST 1 VIEW COMPARISON:  09/15/2019 FINDINGS: Cardiac shadow remains enlarged. Postsurgical changes are again seen as are aortic calcifications. The lungs are well aerated without focal infiltrate or sizable effusion. No bony abnormality is noted. IMPRESSION: No acute abnormality noted. Electronically Signed   By: Inez Catalina M.D.   On: 09/21/2019 18:20   EEG adult  Result Date:  09/22/2019 Lora Havens, MD     09/22/2019 10:05 AM Patient Name: TRANEISHA TUCHMAN MRN: [id number] Epilepsy Attending: Lora Havens Referring Physician/Provider: Dr. Kathrynn Speed Date: 09/21/2019 Duration: 23.18 mins Patient history: 86 year old female presented with left gaze preference and altered mental status.  EEG to evaluate for seizures. Level of alertness: Awake AEDs during EEG study: None Technical aspects: This EEG study was done with scalp electrodes positioned according to the 10-20 International system of electrode placement. Electrical activity was acquired at a sampling rate of 500Hz  and reviewed with a high frequency filter of 70Hz  and a low frequency filter of 1Hz . EEG data were recorded continuously and digitally stored. Description: No clear posterior dominant rhythm was seen.  EEG showed continuous generalized 5 to 6 Hz theta slowing as well as intermittent generalized 2 to 3 Hz delta slowing.  Hyperventilation and photic stimulation were not performed.  Abnormality -Continuous slow, generalized IMPRESSION: This study is suggestive of moderate diffuse encephalopathy, nonspecific etiology. No seizures or epileptiform discharges were seen throughout the recording. Lora Havens   ECHOCARDIOGRAM COMPLETE  Result Date: 09/22/2019    ECHOCARDIOGRAM REPORT   Patient Name:   YOZELIN SEVERO Date of Exam: 09/22/2019 Medical Rec #:  [id number]   Height:       63.0 in Accession #:    [id number]  Weight:       162.9 lb Date of Birth:  11-13-1922  BSA:          1.772 m Patient Age:    68 years    BP:           125/69 mmHg Patient Gender: F           HR:           67 bpm. Exam Location:  Inpatient Procedure: 2D Echo Indications:    stroke 434.91  History:        Patient has prior history of Echocardiogram examinations, most                 recent 08/25/2019. Prior CABG, TIA, Arrythmias:Atrial                 Fibrillation; Risk Factors:Hypertension, Dyslipidemia, Diabetes                 and Former  Smoker. CVA. CAD. Cardiac Valve Replacement (aortic                 valve).  Sonographer:    Jannett Celestine RDCS (AE) Referring Phys: [id number] [facility]  Sonographer Comments: very limited mobility. off axis apical windows IMPRESSIONS  1. Left ventricular ejection fraction, by estimation, is 60 to 65%. The left ventricle has normal function. The left ventricle has no regional wall motion abnormalities. Left ventricular diastolic function could not be evaluated.  2. Right ventricular systolic function is low normal. The right ventricular size is moderately enlarged. There is moderately elevated pulmonary artery systolic pressure. The estimated right ventricular systolic pressure is [id number] mmHg.  3. Left atrial size was moderately dilated.  4. Right atrial size was severely dilated.  5. The mitral valve is normal in structure. Trivial mitral valve regurgitation. No evidence of mitral stenosis.  6. Tricuspid valve regurgitation is moderate.  7. The aortic valve has been repaired/replaced. Aortic valve regurgitation is not visualized. No aortic stenosis is present. FINDINGS  Left Ventricle: Left ventricular ejection fraction, by estimation, is 60 to 65%. The left ventricle has normal function. The left ventricle has no regional wall motion abnormalities. The left ventricular internal cavity size was normal in size. There is  no left ventricular hypertrophy. Left ventricular diastolic function could not be evaluated due to atrial fibrillation. Left ventricular diastolic function could not be evaluated. Right Ventricle: The right ventricular size is moderately enlarged. No increase in right ventricular wall thickness. Right ventricular systolic function is low normal. There is moderately elevated pulmonary artery systolic pressure. The tricuspid regurgitant velocity is 3.42 m/s, and with an assumed right atrial pressure of 8 mmHg, the estimated right ventricular systolic pressure is [id number] mmHg. Left Atrium: Left atrial  size was moderately dilated. Right Atrium: Right atrial size was severely dilated. Pericardium: There is no evidence of pericardial effusion. Mitral Valve: The mitral valve is normal in structure. Trivial mitral valve regurgitation. No evidence of mitral valve stenosis. Tricuspid Valve: The tricuspid valve is normal in structure. Tricuspid valve regurgitation is moderate . No evidence of tricuspid stenosis. Aortic Valve: The aortic valve has been repaired/replaced. Aortic valve regurgitation is not visualized. No aortic stenosis is present. There is a bioprosthetic valve present in the aortic position. Pulmonic Valve: The pulmonic valve was normal in structure. Pulmonic valve regurgitation is not visualized. No evidence of pulmonic stenosis. Aorta: The aortic root was not well visualized. IAS/Shunts: The atrial septum is grossly normal.  LEFT VENTRICLE PLAX 2D LVIDd:         3.90 cm LVIDs:  2.60 cm LV PW:         1.00 cm LV IVS:        1.00 cm LVOT diam:     1.90 cm LV SV:         29 LV SV Index:   16 LVOT Area:     2.84 cm  LEFT ATRIUM         Index LA diam:    4.00 cm 2.26 cm/m  AORTIC VALVE LVOT Vmax:   62.50 cm/s LVOT Vmean:  43.900 cm/s LVOT VTI:    0.102 m  AORTA Ao Root diam: 2.70 cm MITRAL VALVE               TRICUSPID VALVE MV Area (PHT): 2.10 cm    TR Peak grad:   46.8 mmHg MV Decel Time: 362 msec    TR Vmax:        342.00 cm/s MV E velocity: 97.30 cm/s                            SHUNTS                            Systemic VTI:  0.10 m                            Systemic Diam: 1.90 cm Mertie Moores MD Electronically signed by Mertie Moores MD Signature Date/Time: 09/22/2019/2:54:15 PM    Final    CT HEAD CODE STROKE WO CONTRAST  Result Date: 09/21/2019 CLINICAL DATA:  Code stroke. Neuro deficit, acute, stroke suspected. Possible stroke. Weakness, 4/11 EXAM: CT HEAD WITHOUT CONTRAST TECHNIQUE: Contiguous axial images were obtained from the base of the skull through the vertex without intravenous  contrast. COMPARISON:  Brain MRI 05/22/2019, noncontrast head CT and CT angiogram head/neck 05/22/2019 FINDINGS: Brain: There is a 4.5 x 2.7 x 3.1 cm focus of abnormal hypodensity within the right cerebellar hemisphere consistent with acute/subacute infarct. Minimal associated mass effect without significant effacement of the fourth ventricle. There may be subtle associated petechial hemorrhage without frank hemorrhagic conversion. Redemonstrated chronic cortically based infarcts involving the right frontal and parietal lobes as well as well as cortical/subcortical left frontal lobe and left insula. Redemonstrated small chronic cortically based infarct within the right occipital lobe. No new acute supratentorial infarct is identified. No evidence of intracranial mass. No midline shift or extra-axial fluid collection. Stable background moderate/advanced chronic small vessel ischemic disease within the cerebral white matter. Stable, moderate generalized parenchymal atrophy. Vascular: No hyperdense vessel. Skull: Normal. Negative for fracture or focal lesion. Sinuses/Orbits: Visualized orbits demonstrate no acute abnormality. Minimal ethmoid sinus mucosal thickening. No significant mastoid effusion. These results were communicated to Dr. Leonel Ramsay At 5:10 pmon 4/13/2021by text page via the [facility] messaging system. IMPRESSION: 4.5 cm acute/subacute infarct within the right cerebellar hemisphere. Minimal associated mass effect without significant fourth ventricular effacement. There may be subtle associated petechial hemorrhage without frank hemorrhagic conversion. Redemonstrated chronic cortically based infarcts within the bilateral cerebral hemispheres. Stable background moderate/advanced chronic small vessel ischemic disease and moderate generalized parenchymal atrophy. Electronically Signed   By: Kellie Simmering DO   On: 09/21/2019 17:12   VAS US CAROTID (at [facility] and [facility] only)  Result Date: 09/22/2019 Carotid Arterial  Duplex Study Indications:       CVA. Risk Factors:  Diabetes, coronary artery disease. Comparison Study:  08/31/16 B1-39% Performing Technologist: June Leap RDMS, RVT  Examination Guidelines: A complete evaluation includes B-mode imaging, spectral Doppler, color Doppler, and power Doppler as needed of all accessible portions of each vessel. Bilateral testing is considered an integral part of a complete examination. Limited examinations for reoccurring indications may be performed as noted.  Right Carotid Findings: +----------+--------+--------+--------+------------------+--------+           PSV cm/sEDV cm/sStenosisPlaque DescriptionComments +----------+--------+--------+--------+------------------+--------+ CCA Prox  54      8                                          +----------+--------+--------+--------+------------------+--------+ CCA Distal46      9                                          +----------+--------+--------+--------+------------------+--------+ ICA Prox  48      15      1-39%   heterogenous               +----------+--------+--------+--------+------------------+--------+ ICA Distal68      19                                         +----------+--------+--------+--------+------------------+--------+ ECA       52      4                                          +----------+--------+--------+--------+------------------+--------+ +----------+--------+-------+------------------------------+-------------------+           PSV cm/sEDV cmsDescribe                      Arm Pressure (mmHG) +----------+--------+-------+------------------------------+-------------------+ Subclavian               not visualized due to patient                                              position                                          +----------+--------+-------+------------------------------+-------------------+ +---------+--------+--------+--------------+  VertebralPSV cm/sEDV cm/sNot identified +---------+--------+--------+--------------+  Left Carotid Findings: +----------+--------+--------+--------+------------------+---------------------+           PSV cm/sEDV cm/sStenosisPlaque DescriptionComments              +----------+--------+--------+--------+------------------+---------------------+ CCA Prox                                            Proximal CCA is not  visualized.           +----------+--------+--------+--------+------------------+---------------------+ CCA Distal34      10                                                      +----------+--------+--------+--------+------------------+---------------------+ ICA Prox  87      20      1-39%   heterogenous                            +----------+--------+--------+--------+------------------+---------------------+ ICA Distal                                          Not visualized        +----------+--------+--------+--------+------------------+---------------------+ ECA       68      15                                                      +----------+--------+--------+--------+------------------+---------------------+ +----------+--------+--------+-----------------------------+-------------------+           PSV cm/sEDV cm/sDescribe                     Arm Pressure (mmHG) +----------+--------+--------+-----------------------------+-------------------+ Subclavian                not visualized due to patient                                              position                                         +----------+--------+--------+-----------------------------+-------------------+ +---------+--------+--------+--------------+ VertebralPSV cm/sEDV cm/sNot identified +---------+--------+--------+--------------+ Poorly visualized left side due to patient position/ favoring left side   Summary: Right Carotid: Velocities in the right ICA are consistent with a 1-39% stenosis. Left Carotid: Velocities in the left ICA are consistent with a 1-39% stenosis.  *See table(s) above for measurements and observations.     Preliminary         Scheduled Meds: .  stroke: mapping our early stages of recovery book   Does not apply Once  . aspirin  325 mg Oral Daily  . atorvastatin  40 mg Oral Daily  . budesonide  0.5 mg Nebulization Daily  . furosemide  40 mg Oral BID  . insulin aspart  0-15 Units Subcutaneous TID AC & HS  . isosorbide mononitrate  30 mg Oral Daily  . methimazole  5 mg Oral Q M,W,F  . metoprolol tartrate  75 mg Oral BID  . pantoprazole (PROTONIX) IV  40 mg Intravenous Q12H   Continuous Infusions:   LOS: 2 days   Time spent= 35 mins    Luismanuel Corman Arsenio Loader, MD [facility]  If 7PM-7AM, please contact night-coverage  09/23/2019, 12:14 PM

## 2019-09-23 NOTE — Progress Notes (Signed)
Hydrologist [facility]) Hospital Liaison: RN note   Notified by Transition of Care Manger, Jacqualin Combes, RN  of patient/family request for [facility] services at home after discharge. Chart and patient information under review by [facility] physician. Hospice eligibility pending currently.   Writer spoke with to daughter, [name] initiate education related to hospice philosophy, services and team approach to care. verbalized understanding of information given. Per discussion, plan is for discharge to home by PTAR. Please send signed and completed DNR form home with patient/family. Patient will need prescriptions for discharge comfort medications.   DME needs have been discussed, patient currently has the following equipment in the home: oxygen, Walker, W/C. Patient/family requests the following DME for delivery to the home: hospital bed, oral suction. [place] equipment manager has been notified and will contact DME provider to arrange delivery to the home. Home address has been verified and is correct in the chart. [name]  is the family member to contact to arrange time of delivery.   [facility] aware of the above. Please notify [facility] when patient is ready to leave the unit at discharge. (Call [phone] or [phone] after 5pm.) [facility] information and contact numbers given to [facility].   Please call with any hospice related questions.   Thank you for this referral.   Farrel Gordon, RN, [facility] (listed on [place] under [place])  [phone]

## 2019-09-23 NOTE — Progress Notes (Signed)
SLP Cancellation Note  Patient Details Name: Rebecca Skinner MRN: [id number] DOB: May 13, 1923   Cancelled treatment:       Reason Eval/Treat Not Completed: Fatigue/lethargy limiting ability to participate.  Per RN report and chart review, pt is too lethargic to participate in a cognitive-linguistic evaluation at this time.  SLP will re-attempt tomorrow as schedule allows.     Elvia Collum [place] [address] 09/23/2019, 1:05 PM

## 2019-09-23 NOTE — Progress Notes (Signed)
OT Cancellation Note  Patient Details Name: Rebecca Skinner MRN: [id number] DOB: [birth year]   Cancelled Treatment:    Reason Eval/Treat Not Completed: Fatigue/lethargy limiting ability to participate;Patient's level of consciousness. Attempted to see pt x 2 this date, however pt extremely lethargic, unable to open eyes to verbal or tactile stimuli. RN and pt's daughter present in room reporting that anytime pt is given seroquel pt is nonfunctioning the next day. RN and OT in agreement to hold this date, with OT to follow up tomorrow as time allows.   Mauri [place] 09/23/2019, 10:19 AM

## 2019-09-23 NOTE — Telephone Encounter (Signed)
Please advise 

## 2019-09-23 NOTE — Telephone Encounter (Signed)
Yes, thank you.

## 2019-09-23 NOTE — Consult Note (Signed)
   [facility] Inpatient Consult   09/23/2019  Rebecca Skinner [birth year] [id number]   Patient is currently active with [facility] for chronic disease management services.  Patient has been engaged by a [facility] and also [facility] Licensed Education officer, museum.   Our community based plan of care has focused on disease management and community resource support.  Patient was also discussed in a multidisciplinary call this morning for ongoing barriers to care.   Patient will receive a post hospital call and will be evaluated for assessments and disease process education.    Plan: Reach out to Inpatient Transition Of Care [TOC] team member to make aware that [facility] following.  Follow up oon recommendations for transitional needs.  Of note, [facility] Care Management services does not replace or interfere with any services that are needed or arranged by inpatient [facility] care management team.  For additional questions or referrals please contact:   Natividad Brood, RN BSN [facility] Liaison  [phone] business mobile phone Toll free office [phone]  Fax number: [phone] [email or web address]

## 2019-09-23 NOTE — TOC Initial Note (Signed)
Transition of Care [facility]) - Initial/Assessment Note    Patient Details  Name: MCKENZE SLONE MRN: [id number] Date of Birth: April 10, 1923  Transition of Care [facility]) CM/SW Contact:    Pollie Friar, RN Phone Number: 09/23/2019, 10:44 AM  Clinical Narrative:                 CM consulted for home with hospice for patient. CM met with the patient and her daughter: [name]. [name] is interested in home with hospice and has been the caregiver for the patient. She asked to have [facility] call her and go over their services. She then will discuss this with her siblings and make the final decision. CM reached out to Bevely Palmer with [facility] and she will call and speak with [name].  TOC following.  Expected Discharge Plan: [place] Barriers to Discharge: Continued Medical Work up   Patient Goals and CMS Choice   CMS Medicare.gov Compare Post Acute Care list provided to:: Patient Represenative (must comment) Choice offered to / list presented to : Adult Children  Expected Discharge Plan and Services Expected Discharge Plan: [place]   Discharge Planning Services: CM Consult Post Acute Care Choice: Hospice Living arrangements for the past 2 months: [place]: Hospice and [place] Date [place]: 09/23/19 Time [place]: 23 Representative spoke with at [place]: Bevely Palmer  Prior Living Arrangements/Services Living arrangements for the past 2 months: [place] with:: Adult Children Patient language and need for interpreter reviewed:: Yes        Need for Family Participation in Patient Care: Yes (Comment) Care giver support system in place?: Yes (comment)   Criminal Activity/Legal Involvement Pertinent to Current Situation/Hospitalization: No - Comment as needed  Activities of Daily Living      Permission Sought/Granted Permission sought to  share information with : Facility Sport and exercise psychologist, Family Supports Permission granted to share information with : Yes, Verbal Permission Granted  Share Information with NAME: [name]  Permission granted to share info w AGENCY: [facility]        Emotional Assessment Appearance:: Appears stated age         Psych Involvement: No (comment)  Admission diagnosis:  Altered mental status [R41.82] Acute ischemic stroke ([place]) [I63.9] Cerebellar stroke, acute ([place]) [I63.9] Cerebellar stroke [facility]) [I63.9] Patient Active Problem List   Diagnosis Date Noted  . Hyperkalemia 09/21/2019  . Chronic respiratory failure with hypoxia ([place]) 09/21/2019  . Acute on chronic diastolic CHF (congestive heart failure) ([place]) 09/14/2019  . DNR (do not resuscitate) 08/24/2019  . Melena 08/09/2019  . Stroke-like symptom   . Hypokalemia 05/22/2019  . TIA (transient ischemic attack) 08/30/2016  . Exposure keratitis 09/23/2015  . Keratopathy, band 09/13/2015  . PCP NOTES >>>>> 04/05/2015  . Chronic diastolic heart failure ([place]) 12/29/2014  . Edema 12/20/2014  . Dyspnea 12/20/2014  . Angiectasia 03/30/2014  . GI bleed 03/28/2014  . Dementia with behavioral disturbance ([place]) 09/16/2013  . Cerebellar stroke, acute ([place]) 08/27/2013  . Annual physical exam 11/21/2011  . Macrocytic anemia 07/17/2010  . ATRIAL FIBRILLATION, chronic 03/05/2007  . Diabetes mellitus type II, controlled ([place]) 11/26/2006  . S/P AVR (aortic valve replacement) 10/15/2006  . Hyperthyroidism 09/03/2006  . Mixed hyperlipidemia 09/03/2006  . HTN (  hypertension) 09/03/2006  . CAD (coronary artery disease) of artery bypass graft 09/03/2006  . Asthma, chronic 09/03/2006   PCP:  Paz, Jose E, MD Pharmacy:   [id number] - [place], [place] - [address] [place] [address] Phone: [phone] Fax: [phone]     Social Determinants of Health (SDOH) Interventions    Readmission Risk  Interventions Readmission Risk Prevention Plan 09/15/2019  Transportation Screening Complete  Medication Review (RN Care Manager) Complete  PCP or Specialist appointment within 3-5 days of discharge Complete  HRI or Home Care Consult Complete  SW Recovery Care/Counseling Consult Complete  Palliative Care Screening Not Applicable  Skilled Nursing Facility Complete  Some recent data might be hidden    

## 2019-09-23 NOTE — Telephone Encounter (Signed)
Spoke w/ Danise Mina- made her aware that PCP will be attending.

## 2019-09-23 NOTE — Plan of Care (Signed)

## 2019-09-23 NOTE — Telephone Encounter (Signed)
Caller : [place] Call Back # [phone]   Patient d/c from hospital on tomorrow per [facility].    Question :  Will Paz be the attending to sign comfort care orders   Please advise

## 2019-09-24 ENCOUNTER — Ambulatory Visit: Payer: Medicare Other | Admitting: Physician Assistant

## 2019-09-24 ENCOUNTER — Telehealth: Payer: Self-pay

## 2019-09-24 DIAGNOSIS — I10 Essential (primary) hypertension: Secondary | ICD-10-CM | POA: Diagnosis not present

## 2019-09-24 DIAGNOSIS — J9611 Chronic respiratory failure with hypoxia: Secondary | ICD-10-CM | POA: Diagnosis not present

## 2019-09-24 DIAGNOSIS — I482 Chronic atrial fibrillation, unspecified: Secondary | ICD-10-CM | POA: Diagnosis not present

## 2019-09-24 DIAGNOSIS — I5032 Chronic diastolic (congestive) heart failure: Secondary | ICD-10-CM | POA: Diagnosis not present

## 2019-09-24 LAB — GLUCOSE, CAPILLARY
Glucose-Capillary: 97 mg/dL (ref 70–99)
Glucose-Capillary: 100 mg/dL — ABNORMAL HIGH (ref 70–99)
Glucose-Capillary: 92 mg/dL (ref 70–99)
Glucose-Capillary: 82 mg/dL (ref 70–99)

## 2019-09-24 LAB — BASIC METABOLIC PANEL
Anion gap: 16 — ABNORMAL HIGH (ref 5–15)
BUN: 18 mg/dL (ref 8–23)
CO2: 28 mmol/L (ref 22–32)
Calcium: 9.2 mg/dL (ref 8.9–10.3)
Chloride: 98 mmol/L (ref 98–111)
Creatinine, Ser: 1.06 mg/dL — ABNORMAL HIGH (ref 0.44–1.00)
GFR calc Af Amer: 52 mL/min — ABNORMAL LOW (ref >60)
GFR calc non Af Amer: 45 mL/min — ABNORMAL LOW (ref >60)
Glucose, Bld: 103 mg/dL — ABNORMAL HIGH (ref 70–99)
Potassium: 3.9 mmol/L (ref 3.5–5.1)
Sodium: 142 mmol/L (ref 135–145)

## 2019-09-24 LAB — MAGNESIUM: Magnesium: 1.8 mg/dL (ref 1.7–2.4)

## 2019-09-24 MED ORDER — PANTOPRAZOLE SODIUM 40 MG PO TBEC: 40.0000 mg | DELAYED_RELEASE_TABLET | Freq: Two times a day (BID) | ORAL | 0 refills | Status: AC

## 2019-09-24 MED ORDER — ASPIRIN EC 325 MG PO TBEC: 325.0000 mg | DELAYED_RELEASE_TABLET | Freq: Every day | ORAL | 0 refills | Status: AC

## 2019-09-24 MED FILL — PANTOPRAZOLE SOD DR 40 MG T: 40 | 30 days supply | Qty: 60 | Fill #0

## 2019-09-24 MED FILL — ASPIRIN EC 325 MG TABLET: 325 | 30 days supply | Qty: 30 | Fill #0

## 2019-09-24 NOTE — Discharge Summary (Signed)
Physician Discharge Summary  Rebecca Skinner [id number] DOB: May 19, 1923 DOA: 09/21/2019  PCP: Colon Branch, MD  Admit date: 09/21/2019 Discharge date: 09/24/2019  Admitted From: Home Disposition: Home with hospice  Recommendations for Outpatient Follow-up:  1. Follow up with PCP in 1-2 weeks 2. Please obtain BMP/CBC in one week your next doctors visit.  3. Aspirin enteric-coated 325 mg orally daily.  Protonix 40 mg twice daily before meals 4. Discontinue Seroquel, family does not want her to be on Seroquel low-dose as it makes her very drowsy.   Discharge Condition: Stable CODE STATUS: DNR Diet recommendation: Heart healthy  Brief/Interim Summary: 86 year old with history of chronic A. fib, CVA, TIA, DM2, GERD, recent GI bleeding conservatively managed, CAD, Graves' disease, HLD found to have dysarthria and confusion.  Eliquis recently discontinued due to GI bleeding.  Diagnosed with acute 4.5 cm right cerebellar infarct.  Neurology consulted and patient admitted to the hospital.  Given limited options for anticoagulation, neurology recommending aspirin enteric-coated 225 mg daily.  After goals of care discussion family has opted for home hospice.  I have consulted hospice team.  All the arrangements were made accordingly.   Assessment & Plan:    Acute 4.5 cm cerebellar CVA, right-sided -Patient seen by neurology.  Started on antiplatelet therapy. -Neurology recommending aspirin enteric-coated 325 mg daily. No need for Plavix or Eliquis -Lipitor 80 mg daily -PT/OT -Appreciate neurology input -MRI brain-acute right superior cerebellar CVA, right superior cerebellar artery occlusion, moderate stenosis of the right ICA.  Other old chronic ischemic infarcts Carotid Dopplers 1-39% b/l  Acute metabolic encephalopathy, drowsiness -Improved.  No focal neuro deficits.  Discontinue Seroquel at family's request  Diabetes mellitus type 2 -Hemoglobin A1c 6.1 -Not on any home  medication  Essential hypertension -Continue metoprolol.  Otherwise permissive hypertension  Chronic atrial fibrillation -Eliquis recently discontinued due to GI bleeding -Enteric-coated full dose aspirin  Congestive heart failure with preserved ejection fraction -Continue home medications  Chronic hypoxic respiratory failure, 2 L nasal cannula -Continue supplemental oxygen.  Supportive care  Hyperlipidemia -Lipitor 80 mg daily  Goals of care discussion-had extensive discussion with both the daughters today.  Agreeable for home hospice.  I have consulted hospice team.    Discharge Diagnoses:  Active Problems:   Diabetes mellitus type II, controlled ([facility])   Mixed hyperlipidemia   HTN (hypertension)   ATRIAL FIBRILLATION, chronic   Cerebellar stroke, acute (HCC)   Chronic diastolic heart failure (HCC)   Hyperkalemia   Chronic respiratory failure with hypoxia [facility])    Consultations:  Neurology   Subjective: Drowsy but arousable.  Daughter at bedside.  No complaints.  Discharge Exam: Vitals:   09/24/19 0000 09/24/19 0435  BP: (!) 152/66 118/86  Pulse: 72 72  Resp:    Temp: 98.4 F (36.9 C) 98.3 F (36.8 C)  SpO2: 100% 100%   Vitals:   09/23/19 0936 09/23/19 2036 09/24/19 0000 09/24/19 0435  BP:  (!) 149/98 (!) 152/66 118/86  Pulse:  95 72 72  Resp:  16    Temp:  98.3 F (36.8 C) 98.4 F (36.9 C) 98.3 F (36.8 C)  TempSrc:  Oral Oral Oral  SpO2: 91% 100% 100% 100%  Weight:      Height:        General: Drowsy, arousable to her name.  Pupils are equally reactive bilaterally. Cardiovascular: RRR, S1/S2 +, no rubs, no gallops Respiratory: Minimal bilateral rhonchi at the bases Abdominal: Soft, NT, ND, bowel sounds + Extremities: no edema,  no cyanosis  Discharge Instructions   Allergies as of 09/24/2019      Reactions   Hydrocodone Itching, Nausea Only   Tramadol Hcl Itching, Nausea Only      Medication List    STOP taking these  medications   acetaminophen 500 MG tablet Commonly known as: TYLENOL     TAKE these medications   albuterol 0.63 MG/3ML nebulizer solution Commonly known as: ACCUNEB Take 3 mLs by nebulization every 6 (six) hours as needed for wheezing or shortness of breath.   aspirin EC 325 MG tablet Take 1 tablet (325 mg total) by mouth daily. What changed:   medication strength  how much to take   atorvastatin 40 MG tablet Commonly known as: LIPITOR TAKE 1 TABLET BY MOUTH EVERYDAY AT BEDTIME What changed: See the new instructions.   budesonide 0.5 MG/2ML nebulizer solution Commonly known as: PULMICORT Take 2 mLs (0.5 mg total) by nebulization daily.   Calcium 500 + D 500-125 MG-UNIT Tabs Generic drug: Calcium Carbonate-Vitamin D Take 1 tablet by mouth at bedtime.   esomeprazole 40 MG capsule Commonly known as: NEXIUM Take 1 capsule (40 mg total) by mouth daily as needed.   ferrous sulfate 325 (65 FE) MG tablet Take 325 mg by mouth daily with breakfast.   furosemide 40 MG tablet Commonly known as: LASIX Take 1 tablet (40 mg total) by mouth 2 (two) times daily.   isosorbide mononitrate 30 MG 24 hr tablet Commonly known as: IMDUR TAKE 1 TABLET BY MOUTH EVERY DAY   magnesium oxide 400 MG tablet Commonly known as: MAG-OX Take 1 tablet (400 mg total) by mouth daily.   methimazole 5 MG tablet Commonly known as: TAPAZOLE TAKE 1 TABLET BY MOUTH 3 TIMES A WEEK What changed: See the new instructions.   Metoprolol Tartrate 75 MG Tabs Take 75 mg by mouth 2 (two) times daily.   nitroGLYCERIN 0.4 MG SL tablet Commonly known as: NITROSTAT Place 1 tablet (0.4 mg total) under the tongue every 5 (five) minutes x 3 doses as needed for chest pain.   pantoprazole 40 MG tablet Commonly known as: Protonix Take 1 tablet (40 mg total) by mouth 2 (two) times daily before a meal.   potassium chloride 10 MEQ tablet Commonly known as: Klor-Con M10 Take 1 tablet (10 mEq total) by mouth  daily.   QUEtiapine 25 MG tablet Commonly known as: SEROquel Take 1 tablet (25 mg total) by mouth at bedtime. What changed:   when to take this  reasons to take this      [place], MD. Schedule an appointment as soon as possible for a visit in 1 week(s).   Specialty: Internal Medicine Contact information: [address] [place] [address] [phone]        Troy Sine, MD .   Specialty: Cardiology Contact information: [address] [place] [address] [phone]          Allergies  Allergen Reactions  . Hydrocodone Itching and Nausea Only  . Tramadol Hcl Itching and Nausea Only    You were cared for by a hospitalist during your hospital stay. If you have any questions about your discharge medications or the care you received while you were in the hospital after you are discharged, you can call the unit and asked to speak with the hospitalist on call if the hospitalist that took care of you is not available. Once you  are discharged, your primary care physician will handle any further medical issues. Please note that no refills for any discharge medications will be authorized once you are discharged, as it is imperative that you return to your primary care physician (or establish a relationship with a primary care physician if you do not have one) for your aftercare needs so that they can reassess your need for medications and monitor your lab values.   Procedures/Studies: DG Chest 2 View  Result Date: 09/13/2019 CLINICAL DATA:  86 year old female with shortness of breath. EXAM: CHEST - 2 VIEW COMPARISON:  Chest radiograph dated 08/24/2019. FINDINGS: There is cardiomegaly with mild vascular congestion and small bilateral pleural effusions relatively similar to prior radiograph. Bibasilar, left greater right atelectasis although infiltrate is not excluded. Clinical correlation is recommended. No  pneumothorax. There is atherosclerotic calcification of the aorta. Median sternotomy wires and postsurgical changes of CABG and heart valve replacement. Degenerative changes of the spine. No acute osseous pathology. IMPRESSION: Cardiomegaly with findings of CHF and small bilateral pleural effusions. Superimposed pneumonia is not excluded. Overall slight interval improvement in the aeration of the lungs since the radiograph of 08/24/2019. Electronically Signed   By: Anner Crete M.D.   On: 09/13/2019 16:22   MR ANGIO HEAD WO CONTRAST  Result Date: 09/21/2019 CLINICAL DATA:  Acute neurologic deficit EXAM: MRI HEAD WITHOUT CONTRAST MRA HEAD WITHOUT CONTRAST TECHNIQUE: Multiplanar, multiecho pulse sequences of the brain and surrounding structures were obtained without intravenous contrast. Angiographic images of the head were obtained using MRA technique without contrast. COMPARISON:  Brain MRI 05/22/2019 Head CT 09/21/2019 FINDINGS: MRI HEAD FINDINGS Brain: There is an acute infarct of the right superior cerebellar artery territory. No other acute ischemia. No hemorrhage or mass effect. Old bilateral frontal and right parietal infarct. Diffuse generalized atrophy. Diffuse confluent hyperintense T2-weighted signal within the periventricular, deep and juxtacortical white matter, most commonly due to chronic ischemic microangiopathy. fewer than 5 scattered microhemorrhages in a nonspecific pattern. Normal midline structures. Vascular: See discussion below Skull and upper cervical spine: Normal marrow signal. Sinuses/Orbits: Negative. Other: None. MRA HEAD FINDINGS POSTERIOR CIRCULATION: --Vertebral arteries: Right dominant V4 segments. Diminutive left V4. --Posterior inferior cerebellar arteries (PICA): Patent origins from the vertebral arteries. --Anterior inferior cerebellar arteries (AICA): Patent origins from the basilar artery. --Basilar artery: Normal. --Superior cerebellar arteries: Occluded on the right.  Normal left. --Posterior cerebral arteries: Normal. Both originate from the basilar artery. Posterior communicating arteries (p-comm) are diminutive or absent. ANTERIOR CIRCULATION: --Intracranial internal carotid arteries: Moderate stenosis of the distal cavernous segment of the right ICA. Normal left. --Anterior cerebral arteries (ACA): Normal. Both A1 segments are present. Patent anterior communicating artery (a-comm). --Middle cerebral arteries (MCA): Normal. IMPRESSION: 1. Acute infarct of the right superior cerebellar artery territory. No hemorrhage or mass effect. 2. Right superior cerebellar artery occlusion. 3. Moderate stenosis of the right internal carotid artery distal cavernous segment. 4. Severe chronic ischemic microangiopathy, multiple old infarcts and generalized atrophy. Electronically Signed   By: Ulyses Jarred M.D.   On: 09/21/2019 22:50   MR BRAIN WO CONTRAST  Result Date: 09/21/2019 CLINICAL DATA:  Acute neurologic deficit EXAM: MRI HEAD WITHOUT CONTRAST MRA HEAD WITHOUT CONTRAST TECHNIQUE: Multiplanar, multiecho pulse sequences of the brain and surrounding structures were obtained without intravenous contrast. Angiographic images of the head were obtained using MRA technique without contrast. COMPARISON:  Brain MRI 05/22/2019 Head CT 09/21/2019 FINDINGS: MRI HEAD FINDINGS Brain: There is an acute infarct of the right superior cerebellar artery  territory. No other acute ischemia. No hemorrhage or mass effect. Old bilateral frontal and right parietal infarct. Diffuse generalized atrophy. Diffuse confluent hyperintense T2-weighted signal within the periventricular, deep and juxtacortical white matter, most commonly due to chronic ischemic microangiopathy. fewer than 5 scattered microhemorrhages in a nonspecific pattern. Normal midline structures. Vascular: See discussion below Skull and upper cervical spine: Normal marrow signal. Sinuses/Orbits: Negative. Other: None. MRA HEAD FINDINGS  POSTERIOR CIRCULATION: --Vertebral arteries: Right dominant V4 segments. Diminutive left V4. --Posterior inferior cerebellar arteries (PICA): Patent origins from the vertebral arteries. --Anterior inferior cerebellar arteries (AICA): Patent origins from the basilar artery. --Basilar artery: Normal. --Superior cerebellar arteries: Occluded on the right. Normal left. --Posterior cerebral arteries: Normal. Both originate from the basilar artery. Posterior communicating arteries (p-comm) are diminutive or absent. ANTERIOR CIRCULATION: --Intracranial internal carotid arteries: Moderate stenosis of the distal cavernous segment of the right ICA. Normal left. --Anterior cerebral arteries (ACA): Normal. Both A1 segments are present. Patent anterior communicating artery (a-comm). --Middle cerebral arteries (MCA): Normal. IMPRESSION: 1. Acute infarct of the right superior cerebellar artery territory. No hemorrhage or mass effect. 2. Right superior cerebellar artery occlusion. 3. Moderate stenosis of the right internal carotid artery distal cavernous segment. 4. Severe chronic ischemic microangiopathy, multiple old infarcts and generalized atrophy. Electronically Signed   By: Ulyses Jarred M.D.   On: 09/21/2019 22:50   DG CHEST PORT 1 VIEW  Result Date: 09/21/2019 CLINICAL DATA:  Altered mental status EXAM: PORTABLE CHEST 1 VIEW COMPARISON:  09/15/2019 FINDINGS: Cardiac shadow remains enlarged. Postsurgical changes are again seen as are aortic calcifications. The lungs are well aerated without focal infiltrate or sizable effusion. No bony abnormality is noted. IMPRESSION: No acute abnormality noted. Electronically Signed   By: Inez Catalina M.D.   On: 09/21/2019 18:20   DG Chest Port 1V same Day  Result Date: 09/15/2019 CLINICAL DATA:  Shortness of breath, history of CHF. EXAM: PORTABLE CHEST 1 VIEW COMPARISON:  09/13/2019 chest radiograph. FINDINGS: Mildly hypoinflated lungs. Diffuse interstitial prominence and mild  pulmonary vascular congestion, less conspicuous than prior exam. Grossly unchanged left predominant basilar/retrocardiac opacities. No pneumothorax. Small left pleural effusion, unchanged. Cardiomegaly. Sequela of CABG and valvular prosthesis. Multilevel spondylosis. IMPRESSION: Left basilar/retrocardiac opacities and small left pleural effusion are unchanged. Decreased conspicuity of background pulmonary edema. Cardiomegaly, unchanged. Electronically Signed   By: Primitivo Gauze M.D.   On: 09/15/2019 09:59   EEG adult  Result Date: 09/22/2019 Lora Havens, MD     09/22/2019 10:05 AM Patient Name: Rebecca Skinner MRN: [id number] Epilepsy Attending: Lora Havens Referring Physician/Provider: Dr. Kathrynn Speed Date: 09/21/2019 Duration: 23.18 mins Patient history: 86 year old female presented with left gaze preference and altered mental status.  EEG to evaluate for seizures. Level of alertness: Awake AEDs during EEG study: None Technical aspects: This EEG study was done with scalp electrodes positioned according to the 10-20 International system of electrode placement. Electrical activity was acquired at a sampling rate of 500Hz  and reviewed with a high frequency filter of 70Hz  and a low frequency filter of 1Hz . EEG data were recorded continuously and digitally stored. Description: No clear posterior dominant rhythm was seen.  EEG showed continuous generalized 5 to 6 Hz theta slowing as well as intermittent generalized 2 to 3 Hz delta slowing.  Hyperventilation and photic stimulation were not performed.  Abnormality -Continuous slow, generalized IMPRESSION: This study is suggestive of moderate diffuse encephalopathy, nonspecific etiology. No seizures or epileptiform discharges were seen throughout the recording. Priyanka O  Hortense Ramal   ECHOCARDIOGRAM COMPLETE  Result Date: 09/22/2019    ECHOCARDIOGRAM REPORT   Patient Name:   Rebecca Skinner Date of Exam: 09/22/2019 Medical Rec #:  [id number]   Height:        63.0 in Accession #:    [id number]  Weight:       162.9 lb Date of Birth:  07/05/1922    BSA:          1.772 m Patient Age:    19 years    BP:           125/69 mmHg Patient Gender: F           HR:           67 bpm. Exam Location:  Inpatient Procedure: 2D Echo Indications:    stroke 434.91  History:        Patient has prior history of Echocardiogram examinations, most                 recent 08/25/2019. Prior CABG, TIA, Arrythmias:Atrial                 Fibrillation; Risk Factors:Hypertension, Dyslipidemia, Diabetes                 and Former Smoker. CVA. CAD. Cardiac Valve Replacement (aortic                 valve).  Sonographer:    Jannett Celestine RDCS (AE) Referring Phys: [id number] Sherryll Burger [facility]  Sonographer Comments: very limited mobility. off axis apical windows IMPRESSIONS  1. Left ventricular ejection fraction, by estimation, is 60 to 65%. The left ventricle has normal function. The left ventricle has no regional wall motion abnormalities. Left ventricular diastolic function could not be evaluated.  2. Right ventricular systolic function is low normal. The right ventricular size is moderately enlarged. There is moderately elevated pulmonary artery systolic pressure. The estimated right ventricular systolic pressure is [id number] mmHg.  3. Left atrial size was moderately dilated.  4. Right atrial size was severely dilated.  5. The mitral valve is normal in structure. Trivial mitral valve regurgitation. No evidence of mitral stenosis.  6. Tricuspid valve regurgitation is moderate.  7. The aortic valve has been repaired/replaced. Aortic valve regurgitation is not visualized. No aortic stenosis is present. FINDINGS  Left Ventricle: Left ventricular ejection fraction, by estimation, is 60 to 65%. The left ventricle has normal function. The left ventricle has no regional wall motion abnormalities. The left ventricular internal cavity size was normal in size. There is  no left ventricular hypertrophy. Left ventricular  diastolic function could not be evaluated due to atrial fibrillation. Left ventricular diastolic function could not be evaluated. Right Ventricle: The right ventricular size is moderately enlarged. No increase in right ventricular wall thickness. Right ventricular systolic function is low normal. There is moderately elevated pulmonary artery systolic pressure. The tricuspid regurgitant velocity is 3.42 m/s, and with an assumed right atrial pressure of 8 mmHg, the estimated right ventricular systolic pressure is [id number] mmHg. Left Atrium: Left atrial size was moderately dilated. Right Atrium: Right atrial size was severely dilated. Pericardium: There is no evidence of pericardial effusion. Mitral Valve: The mitral valve is normal in structure. Trivial mitral valve regurgitation. No evidence of mitral valve stenosis. Tricuspid Valve: The tricuspid valve is normal in structure. Tricuspid valve regurgitation is moderate . No evidence of tricuspid stenosis. Aortic Valve: The aortic valve has been repaired/replaced. Aortic valve regurgitation is not visualized. No  aortic stenosis is present. There is a bioprosthetic valve present in the aortic position. Pulmonic Valve: The pulmonic valve was normal in structure. Pulmonic valve regurgitation is not visualized. No evidence of pulmonic stenosis. Aorta: The aortic root was not well visualized. IAS/Shunts: The atrial septum is grossly normal.  LEFT VENTRICLE PLAX 2D LVIDd:         3.90 cm LVIDs:         2.60 cm LV PW:         1.00 cm LV IVS:        1.00 cm LVOT diam:     1.90 cm LV SV:         29 LV SV Index:   16 LVOT Area:     2.84 cm  LEFT ATRIUM         Index LA diam:    4.00 cm 2.26 cm/m  AORTIC VALVE LVOT Vmax:   62.50 cm/s LVOT Vmean:  43.900 cm/s LVOT VTI:    0.102 m  AORTA Ao Root diam: 2.70 cm MITRAL VALVE               TRICUSPID VALVE MV Area (PHT): 2.10 cm    TR Peak grad:   46.8 mmHg MV Decel Time: 362 msec    TR Vmax:        342.00 cm/s MV E velocity: 97.30 cm/s                             SHUNTS                            Systemic VTI:  0.10 m                            Systemic Diam: 1.90 cm Mertie Moores MD Electronically signed by Mertie Moores MD Signature Date/Time: 09/22/2019/2:54:15 PM    Final    ECHOCARDIOGRAM COMPLETE  Result Date: 08/25/2019    ECHOCARDIOGRAM REPORT   Patient Name:   Rebecca Skinner Date of Exam: 08/25/2019 Medical Rec #:  [id number]   Height:       62.0 in Accession #:    [id number]  Weight:       182.3 lb Date of Birth:  August 01, 1922    BSA:          1.838 m Patient Age:    79 years    BP:           155/63 mmHg Patient Gender: F           HR:           65 bpm. Exam Location:  Inpatient Procedure: 2D Echo and Intracardiac Opacification Agent Indications:    CHF-Acute Diastolic [id number] / [id number]  History:        Patient has prior history of Echocardiogram examinations, most                 recent 01/27/2019. CAD, Pacemaker and Prior CABG, TIA and Stroke,                 Arrythmias:Atrial Fibrillation and sick sinus syndrome; Risk                 Factors:Hypertension, Dyslipidemia and Diabetes. GERD. Dementia.                 Aortic  Valve: unknown pericardial valve is present in the aortic                 position. Procedure Date: 1998.  Sonographer:    Darlina Sicilian RDCS Referring Phys: [id number] RONDELL A SMITH  Sonographer Comments: Definity attempted, nevery returned to the heart, nurse made aware. IMPRESSIONS  1. Left ventricular ejection fraction, by estimation, is 60 to 65%. The left ventricle has normal function. The left ventricle has no regional wall motion abnormalities. There is moderate left ventricular hypertrophy. Left ventricular diastolic parameters are indeterminate. There is the interventricular septum is flattened in systole and diastole, consistent with right ventricular pressure and volume overload.  2. Right ventricular systolic function is mildly reduced. The right ventricular size is severely enlarged. There is moderately elevated  pulmonary artery systolic pressure. The estimated right ventricular systolic pressure is 123456 mmHg.  3. Left atrial size was mildly dilated.  4. Right atrial size was severely dilated.  5. The mitral valve is normal in structure. Trivial mitral valve regurgitation.  6. Tricuspid valve regurgitation is moderate to severe.  7. The aortic valve has been repaired/replaced. Aortic valve regurgitation is trivial. There is a bioprosthetic valve present in the aortic position. Normal bioprosthetic function. Mean gradient 3mmHg, EOA 2.1 cm^2, DI 0.62  8. Aortic dilatation noted. There is mild dilatation of the ascending aorta measuring 37 mm.  9. The inferior vena cava is dilated in size with <50% respiratory variability, suggesting right atrial pressure of 15 mmHg. FINDINGS  Left Ventricle: Left ventricular ejection fraction, by estimation, is 60 to 65%. The left ventricle has normal function. The left ventricle has no regional wall motion abnormalities. Definity contrast agent was given IV to delineate the left ventricular  endocardial borders. The left ventricular internal cavity size was small. There is moderate left ventricular hypertrophy. The interventricular septum is flattened in systole and diastole, consistent with right ventricular pressure and volume overload. Left ventricular diastolic parameters are indeterminate. Right Ventricle: The right ventricular size is severely enlarged. Right vetricular wall thickness was not assessed. Right ventricular systolic function is mildly reduced. There is moderately elevated pulmonary artery systolic pressure. The tricuspid regurgitant velocity is 3.67 m/s, and with an assumed right atrial pressure of 15 mmHg, the estimated right ventricular systolic pressure is 123456 mmHg. Left Atrium: Left atrial size was mildly dilated. Right Atrium: Right atrial size was severely dilated. Pericardium: Trivial pericardial effusion is present. Mitral Valve: The mitral valve is normal in  structure. Trivial mitral valve regurgitation. MV peak gradient, 13.5 mmHg. The mean mitral valve gradient is 4.5 mmHg. Tricuspid Valve: The tricuspid valve is normal in structure. Tricuspid valve regurgitation is moderate to severe. Aortic Valve: The aortic valve has been repaired/replaced. Aortic valve regurgitation is trivial. Aortic valve mean gradient measures 9.0 mmHg. Aortic valve peak gradient measures 18.9 mmHg. Aortic valve area, by VTI measures 1.92 cm. There is a unknown  pericardial valve present in the aortic position. Procedure Date: 1998. Pulmonic Valve: The pulmonic valve was grossly normal. Pulmonic valve regurgitation is mild. Aorta: Aortic dilatation noted. There is mild dilatation of the ascending aorta measuring 37 mm. Venous: The inferior vena cava is dilated in size with less than 50% respiratory variability, suggesting right atrial pressure of 15 mmHg. IAS/Shunts: The interatrial septum was not well visualized.  LEFT VENTRICLE PLAX 2D LVIDd:         3.30 cm LVIDs:         2.80 cm LV PW:  1.00 cm LV IVS:        1.20 cm LVOT diam:     1.97 cm LV SV:         77 LV SV Index:   42 LVOT Area:     3.04 cm  LEFT ATRIUM           Index       RIGHT ATRIUM           Index LA diam:      3.70 cm 2.01 cm/m  RA Area:     31.30 cm LA Vol (A4C): 72.4 ml 39.39 ml/m RA Volume:   128.00 ml 69.64 ml/m  AORTIC VALVE AV Area (Vmax):    1.82 cm AV Area (Vmean):   1.69 cm AV Area (VTI):     1.92 cm AV Vmax:           217.50 cm/s AV Vmean:          141.500 cm/s AV VTI:            0.403 m AV Peak Grad:      18.9 mmHg AV Mean Grad:      9.0 mmHg LVOT Vmax:         130.00 cm/s LVOT Vmean:        78.800 cm/s LVOT VTI:          0.255 m LVOT/AV VTI ratio: 0.63  AORTA Ao Root diam: 3.00 cm Ao Asc diam:  3.70 cm MITRAL VALVE                TRICUSPID VALVE MV Area (PHT): 2.69 cm     TR Peak grad:   53.9 mmHg MV Peak grad:  13.5 mmHg    TR Vmax:        367.00 cm/s MV Mean grad:  4.5 mmHg MV Vmax:       1.84  m/s     SHUNTS MV Vmean:      97.6 cm/s    Systemic VTI:  0.26 m MV Decel Time: 282 msec     Systemic Diam: 1.97 cm MV E velocity: 173.50 cm/s Oswaldo Milian MD Electronically signed by Oswaldo Milian MD Signature Date/Time: 08/25/2019/1:29:11 PM    Final    CT HEAD CODE STROKE WO CONTRAST  Result Date: 09/21/2019 CLINICAL DATA:  Code stroke. Neuro deficit, acute, stroke suspected. Possible stroke. Weakness, 4/11 EXAM: CT HEAD WITHOUT CONTRAST TECHNIQUE: Contiguous axial images were obtained from the base of the skull through the vertex without intravenous contrast. COMPARISON:  Brain MRI 05/22/2019, noncontrast head CT and CT angiogram head/neck 05/22/2019 FINDINGS: Brain: There is a 4.5 x 2.7 x 3.1 cm focus of abnormal hypodensity within the right cerebellar hemisphere consistent with acute/subacute infarct. Minimal associated mass effect without significant effacement of the fourth ventricle. There may be subtle associated petechial hemorrhage without frank hemorrhagic conversion. Redemonstrated chronic cortically based infarcts involving the right frontal and parietal lobes as well as well as cortical/subcortical left frontal lobe and left insula. Redemonstrated small chronic cortically based infarct within the right occipital lobe. No new acute supratentorial infarct is identified. No evidence of intracranial mass. No midline shift or extra-axial fluid collection. Stable background moderate/advanced chronic small vessel ischemic disease within the cerebral white matter. Stable, moderate generalized parenchymal atrophy. Vascular: No hyperdense vessel. Skull: Normal. Negative for fracture or focal lesion. Sinuses/Orbits: Visualized orbits demonstrate no acute abnormality. Minimal ethmoid sinus mucosal thickening. No significant mastoid effusion. These results were communicated to Dr.  Kirkpatrick At 5:10 pmon 4/13/2021by text page via the [facility] messaging system. IMPRESSION: 4.5 cm acute/subacute  infarct within the right cerebellar hemisphere. Minimal associated mass effect without significant fourth ventricular effacement. There may be subtle associated petechial hemorrhage without frank hemorrhagic conversion. Redemonstrated chronic cortically based infarcts within the bilateral cerebral hemispheres. Stable background moderate/advanced chronic small vessel ischemic disease and moderate generalized parenchymal atrophy. Electronically Signed   By: Kellie Simmering DO   On: 09/21/2019 17:12   VAS US CAROTID (at [facility] and [facility] only)  Result Date: 09/23/2019 Carotid Arterial Duplex Study Indications:       CVA. Risk Factors:      Diabetes, coronary artery disease. Comparison Study:  08/31/16 B1-39% Performing Technologist: June Leap RDMS, RVT  Examination Guidelines: A complete evaluation includes B-mode imaging, spectral Doppler, color Doppler, and power Doppler as needed of all accessible portions of each vessel. Bilateral testing is considered an integral part of a complete examination. Limited examinations for reoccurring indications may be performed as noted.  Right Carotid Findings: +----------+--------+--------+--------+------------------+--------+           PSV cm/sEDV cm/sStenosisPlaque DescriptionComments +----------+--------+--------+--------+------------------+--------+ CCA Prox  54      8                                          +----------+--------+--------+--------+------------------+--------+ CCA Distal46      9                                          +----------+--------+--------+--------+------------------+--------+ ICA Prox  48      15      1-39%   heterogenous               +----------+--------+--------+--------+------------------+--------+ ICA Distal68      19                                         +----------+--------+--------+--------+------------------+--------+ ECA       52      4                                           +----------+--------+--------+--------+------------------+--------+ +----------+--------+-------+------------------------------+-------------------+           PSV cm/sEDV cmsDescribe                      Arm Pressure (mmHG) +----------+--------+-------+------------------------------+-------------------+ Subclavian               not visualized due to patient                                              position                                          +----------+--------+-------+------------------------------+-------------------+ +---------+--------+--------+--------------+ VertebralPSV cm/sEDV cm/sNot identified +---------+--------+--------+--------------+  Left Carotid Findings: +----------+--------+--------+--------+------------------+---------------------+  PSV cm/sEDV cm/sStenosisPlaque DescriptionComments              +----------+--------+--------+--------+------------------+---------------------+ CCA Prox                                            Proximal CCA is not                                                       visualized.           +----------+--------+--------+--------+------------------+---------------------+ CCA Distal34      10                                                      +----------+--------+--------+--------+------------------+---------------------+ ICA Prox  87      20      1-39%   heterogenous                            +----------+--------+--------+--------+------------------+---------------------+ ICA Distal                                          Not visualized        +----------+--------+--------+--------+------------------+---------------------+ ECA       68      15                                                      +----------+--------+--------+--------+------------------+---------------------+ +----------+--------+--------+-----------------------------+-------------------+           PSV  cm/sEDV cm/sDescribe                     Arm Pressure (mmHG) +----------+--------+--------+-----------------------------+-------------------+ Subclavian                not visualized due to patient                                              position                                         +----------+--------+--------+-----------------------------+-------------------+ +---------+--------+--------+--------------+ VertebralPSV cm/sEDV cm/sNot identified +---------+--------+--------+--------------+ Poorly visualized left side due to patient position/ favoring left side  Summary: Right Carotid: Velocities in the right ICA are consistent with a 1-39% stenosis. Left Carotid: Velocities in the left ICA are consistent with a 1-39% stenosis.  *See table(s) above for measurements and observations.  Electronically signed by Antony Contras MD on 09/23/2019 at 12:36:39 PM.    Final       The results of significant diagnostics from this hospitalization (including imaging, microbiology, ancillary and laboratory) are  listed below for reference.     Microbiology: Recent Results (from the past 240 hour(s))  SARS CORONAVIRUS 2 (TAT 6-24 HRS) Nasopharyngeal Nasopharyngeal Swab     Status: None   Collection Time: 09/22/19  4:28 AM   Specimen: Nasopharyngeal Swab  Result Value Ref Range Status   SARS Coronavirus 2 NEGATIVE NEGATIVE Final    Comment: (NOTE) SARS-CoV-2 target nucleic acids are NOT DETECTED. The SARS-CoV-2 RNA is generally detectable in upper and lower respiratory specimens during the acute phase of infection. Negative results do not preclude SARS-CoV-2 infection, do not rule out co-infections with other pathogens, and should not be used as the sole basis for treatment or other patient management decisions. Negative results must be combined with clinical observations, patient history, and epidemiological information. The expected result is Negative. Fact Sheet for  Patients: SugarRoll.be Fact Sheet for Healthcare Providers: https://www.woods-mathews.com/ This test is not yet approved or cleared by the [place] FDA and  has been authorized for detection and/or diagnosis of SARS-CoV-2 by FDA under an Emergency Use Authorization (EUA). This EUA will remain  in effect (meaning this test can be used) for the duration of the COVID-19 declaration under Section 56 4(b)(1) of the Act, 21 U.S.C. section 360bbb-3(b)(1), unless the authorization is terminated or revoked sooner. Performed at [facility], [place] [address] [place], [place] [address]      Labs: BNP (last 3 results) Recent Labs    05/22/19 2200 08/24/19 0955 09/13/19 1608  BNP 698.3* 713.6* 0000000*   Basic Metabolic Panel: Recent Labs  Lab 09/21/19 1658 09/21/19 1723 09/22/19 0546 09/24/19 0510  NA 138 139 139 142  K 3.7 5.4* 3.8 3.9  CL 99 97* 97* 98  CO2  --  29 28 28   GLUCOSE 112* 109* 114* 103*  BUN 15 13 13 18   CREATININE 0.90 1.03* 0.93 1.06*  CALCIUM  --  8.8* 8.9 9.2  MG  --   --   --  1.8  PHOS  --   --  4.1  --    Liver Function Tests: Recent Labs  Lab 09/21/19 1723 09/22/19 0546  AST 80*  --   ALT 35  --   ALKPHOS 80  --   BILITOT 1.2  --   PROT 6.8  --   ALBUMIN 3.2* 3.1*   No results for input(s): LIPASE, AMYLASE in the last 168 hours. Recent Labs  Lab 09/21/19 1818  AMMONIA 48*   CBC: Recent Labs  Lab 09/21/19 1658 09/21/19 1723  WBC  --  7.4  NEUTROABS  --  5.1  HGB 13.6 11.6*  HCT 40.0 39.4  MCV  --  96.1  PLT  --  252   Cardiac Enzymes: No results for input(s): CKTOTAL, CKMB, CKMBINDEX, TROPONINI in the last 168 hours. BNP: Invalid input(s): POCBNP CBG: Recent Labs  Lab 09/23/19 0906 09/23/19 1219 09/23/19 1541 09/23/19 2111 09/24/19 0748  GLUCAP 106* 109* 101* 98 97   D-Dimer No results for input(s): DDIMER in the last 72 hours. Hgb A1c Recent Labs    09/22/19 0546   HGBA1C 6.1*   Lipid Profile Recent Labs    09/22/19 0546  CHOL 109  HDL 32*  LDLCALC 66  TRIG 54  CHOLHDL 3.4   Thyroid function studies Recent Labs    09/21/19 1818  TSH 1.255   Anemia work up Recent Labs    09/21/19 1818  VITAMINB12 782  FOLATE 13.3   Urinalysis    Component  Value Date/Time   COLORURINE YELLOW 09/22/2019 1002   APPEARANCEUR CLEAR 09/22/2019 1002   LABSPEC 1.017 09/22/2019 1002   PHURINE 5.0 09/22/2019 1002   GLUCOSEU NEGATIVE 09/22/2019 1002   GLUCOSEU NEGATIVE 01/30/2017 1229   HGBUR NEGATIVE 09/22/2019 1002   HGBUR negative 12/18/2009 0937   BILIRUBINUR NEGATIVE 09/22/2019 1002   BILIRUBINUR Neg 11/20/2011 1458   KETONESUR NEGATIVE 09/22/2019 1002   PROTEINUR NEGATIVE 09/22/2019 1002   UROBILINOGEN 0.2 01/30/2017 1229   NITRITE NEGATIVE 09/22/2019 1002   LEUKOCYTESUR NEGATIVE 09/22/2019 1002   Sepsis Labs Invalid input(s): PROCALCITONIN,  WBC,  LACTICIDVEN Microbiology Recent Results (from the past 240 hour(s))  SARS CORONAVIRUS 2 (TAT 6-24 HRS) Nasopharyngeal Nasopharyngeal Swab     Status: None   Collection Time: 09/22/19  4:28 AM   Specimen: Nasopharyngeal Swab  Result Value Ref Range Status   SARS Coronavirus 2 NEGATIVE NEGATIVE Final    Comment: (NOTE) SARS-CoV-2 target nucleic acids are NOT DETECTED. The SARS-CoV-2 RNA is generally detectable in upper and lower respiratory specimens during the acute phase of infection. Negative results do not preclude SARS-CoV-2 infection, do not rule out co-infections with other pathogens, and should not be used as the sole basis for treatment or other patient management decisions. Negative results must be combined with clinical observations, patient history, and epidemiological information. The expected result is Negative. Fact Sheet for Patients: SugarRoll.be Fact Sheet for Healthcare Providers: https://www.woods-mathews.com/ This test is not yet  approved or cleared by the [place] FDA and  has been authorized for detection and/or diagnosis of SARS-CoV-2 by FDA under an Emergency Use Authorization (EUA). This EUA will remain  in effect (meaning this test can be used) for the duration of the COVID-19 declaration under Section 56 4(b)(1) of the Act, 21 U.S.C. section 360bbb-3(b)(1), unless the authorization is terminated or revoked sooner. Performed at [facility], [place] [address] [place],  [address]      Time coordinating discharge:  I have spent 35 minutes face to face with the patient and on the ward discussing the patients care, assessment, plan and disposition with other care givers. >50% of the time was devoted counseling the patient about the risks and benefits of treatment/Discharge disposition and coordinating care.   SIGNED:   Damita Lack, MD  [facility] 09/24/2019, 8:30 AM   If 7PM-7AM, please contact night-coverage

## 2019-09-24 NOTE — Progress Notes (Signed)
SLP Cancellation Note  Patient Details Name: Rebecca Skinner MRN: [id number] DOB: 1922-09-13   Cancelled evaluation:       Reason Eval/Treat Not Completed: Other (comment) Pt is D/Cing home today with hospice; therefore, speech/language eval is not indicated.  Our service will respectfully sign off.  Daine Croker L. Tivis Ringer, [place] CCC/SLP Acute Rehabilitation Services Office number [phone] Pager [phone]    Juan Quam Laurice 09/24/2019, 2:07 PM

## 2019-09-24 NOTE — Progress Notes (Signed)
OT Cancellation Note  Patient Details Name: Rebecca Skinner MRN: [id number] DOB: 13-Oct-1922   Cancelled Treatment:    Reason Eval/Treat Not Completed: Pt discharging home today with hospice care. Will defer OT eval.  Malka So 09/24/2019, 1:53 PM  Nestor Lewandowsky, OTR/L Acute Rehabilitation Services Pager: [phone] Office: [phone]

## 2019-09-24 NOTE — Care Management Important Message (Signed)
Important Message  Patient Details  Name: Rebecca Skinner MRN: [id number] Date of Birth: 30-Apr-1923   Medicare Important Message Given:  Yes     Orbie Pyo 09/24/2019, 4:13 PM

## 2019-09-24 NOTE — Progress Notes (Signed)
Brief GI progress note  Patient being transition to home hospice. Looks like she is just going on high-dose aspirin.  GI will sign off but please call if questions.  Justice Britain, MD [facility] Office # [phone]

## 2019-09-24 NOTE — Progress Notes (Signed)
Manufacturing engineer [facility])  Spoke with daughter [name], DME ordered last night but has not contacted [name] for delivery time.    [name] requested additional DME this morning, will add to order.   ACC will update TOC manager once DME arrived and family is ready for her to d/c home.  Venia Carbon RN, BSN, [facility] Liaison

## 2019-09-24 NOTE — Consult Note (Signed)
   [facility] CM Inpatient Consult   09/24/2019  Rebecca Skinner August 31, 1922 [id number]   Follow up:  Alerted of disposition/transition  Chart reviewed and reveals the patient is currently transitioning to Hospice/Palliative Care    Patient will be followed by [facility] per inpatient Transition of Care team notes.  Plan:  [place] Management team will be updated on transitioning.  Patient will receive full care management with hospice.  [facility] Care Management will sign off at transition.  Natividad Brood, RN BSN [facility] Liaison  [phone] business mobile phone Toll free office [phone]  Fax number: [phone] [email or web address]

## 2019-09-24 NOTE — Progress Notes (Signed)
Pt discharged with PTAR at 2147 on 2L .

## 2019-09-24 NOTE — Patient Outreach (Signed)
[facility] [facility]) Care Management  09/23/2019  PRUDY FRATELLO 12/08/1922 [id number]   Pt was discussed on Multi-Difficult Case Discussion call.  Raina Mina, RN Care Management Coordinator [place] Office [phone]

## 2019-09-24 NOTE — TOC Transition Note (Signed)
Pt Transition of Care [facility]) - CM/SW Discharge Note   Patient Details  Name: Rebecca Skinner MRN: [id number] Date of Birth: 05/09/1923  Transition of Care [facility]) CM/SW Contact:  Pollie Friar, RN Phone Number: 09/24/2019, 1:07 PM   Clinical Narrative:    Pt discharging home with hospice services through [facility]. Pt to receive hospital bed, suction at home. Pt to transport via PTAR once the DME arrives to the home.  Daughter aware of plan and will update when DME has arrived at the home. CM will leave the number for PTAR with the d/c packet in case DME delivered after hours.    Final next level of care: Home w Hospice Care Barriers to Discharge: No Barriers Identified   Patient Goals and CMS Choice   CMS Medicare.gov Compare Post Acute Care list provided to:: Patient Represenative (must comment) Choice offered to / list presented to : Adult Children  Discharge Placement                       Discharge Plan and Services   Discharge Planning Services: CM Consult Post Acute Care Choice: Hospice          DME Arranged: Hospital bed           [facility] Agency: Hospice and [place] Date [place]: 09/23/19 Time [place]: [id number] Representative spoke with at [place]: [place] ([place]) Interventions     Readmission Risk Interventions Readmission Risk Prevention Plan 09/15/2019  Transportation Screening Complete  Medication Review Press photographer) Complete  PCP or Specialist appointment within 3-5 days of discharge Complete  HRI or Home Care Consult Complete  SW Recovery Care/Counseling Consult Complete  Palliative Care Screening Not [place] Complete  Some recent data might be hidden

## 2019-09-27 ENCOUNTER — Encounter: Payer: Self-pay | Admitting: *Deleted

## 2019-09-27 ENCOUNTER — Other Ambulatory Visit: Payer: Self-pay | Admitting: *Deleted

## 2019-09-27 ENCOUNTER — Ambulatory Visit: Payer: Medicare Other | Admitting: Cardiovascular Disease

## 2019-09-27 ENCOUNTER — Ambulatory Visit: Payer: Medicare Other | Admitting: *Deleted

## 2019-09-27 NOTE — Patient Outreach (Signed)
[place] [facility]) Care Management  09/27/2019  Rebecca Skinner 1922-10-16 [id number]   CSW was advised by [place] Liaison of plans for pt to transition home from hospital with hospice care.  CSW will close referral at this time. Please reconsult if needs arise/plans change.   Eduard Clos, MSW, [place] Worker  [employer] [phone]

## 2019-09-27 NOTE — Patient Outreach (Signed)
[place] [facility]) Care Management  09/27/2019  Rebecca Skinner July 16, 1922 [id number]   Case will be closed as pt has transition to hospice services. MD notified by [facility] team member.  Raina Mina, RN Care Management Coordinator [place] Office [phone]

## 2019-09-28 ENCOUNTER — Ambulatory Visit: Payer: Medicare Other | Admitting: Internal Medicine

## 2019-09-29 ENCOUNTER — Ambulatory Visit: Payer: Self-pay | Admitting: *Deleted

## 2019-09-30 DIAGNOSIS — I509 Heart failure, unspecified: Secondary | ICD-10-CM | POA: Diagnosis not present

## 2019-10-01 ENCOUNTER — Ambulatory Visit: Payer: Self-pay | Admitting: *Deleted

## 2019-10-01 ENCOUNTER — Telehealth: Payer: Self-pay

## 2019-10-01 NOTE — Telephone Encounter (Signed)
Received fax confirmation

## 2019-10-01 NOTE — Telephone Encounter (Signed)
Orders received from [employer] for comfort care. Orders signed and faxed back to [phone]. Orders sent for scanning.

## 2019-10-02 DIAGNOSIS — I509 Heart failure, unspecified: Secondary | ICD-10-CM | POA: Diagnosis not present

## 2019-10-04 ENCOUNTER — Telehealth: Payer: Self-pay | Admitting: Internal Medicine

## 2019-10-04 NOTE — Telephone Encounter (Signed)
Stop Nexium, continue Protonix

## 2019-10-04 NOTE — Telephone Encounter (Signed)
Caller: [place]  Call Back # [phone]   Subject : Medication   Per Izora Gala, RN w/[facility]  Request for medication re-fill : pantoprazole (PROTONIX) 40 MG tablet [id number]  Refill(s) 6 times.   Please advise wether patient should also take this medication:   esomeprazole (NEXIUM) 40 MG capsule [id number]   Please Advise

## 2019-10-04 NOTE — Telephone Encounter (Signed)
Spoke w/ Rebecca Skinner- informed to d/c Nexium- med list updated.

## 2019-10-04 NOTE — Telephone Encounter (Signed)
Please advise 

## 2019-10-11 NOTE — Telephone Encounter (Signed)
Please return call to care management

## 2019-10-12 ENCOUNTER — Ambulatory Visit: Payer: Medicare Other | Admitting: *Deleted

## 2019-10-30 DIAGNOSIS — I509 Heart failure, unspecified: Secondary | ICD-10-CM | POA: Diagnosis not present

## 2019-11-01 DIAGNOSIS — I509 Heart failure, unspecified: Secondary | ICD-10-CM | POA: Diagnosis not present

## 2019-11-03 ENCOUNTER — Telehealth: Payer: Self-pay | Admitting: Internal Medicine

## 2019-11-03 ENCOUNTER — Ambulatory Visit: Payer: Medicare Other | Admitting: Adult Health

## 2019-11-03 NOTE — Telephone Encounter (Signed)
Spoke w/ Rebecca Skinner- she is actually needing okay to change med from liquid to tablet form- verbal orders given.

## 2019-11-03 NOTE — Telephone Encounter (Signed)
Okay to change guaifenesin to pill form, same sig

## 2019-11-03 NOTE — Telephone Encounter (Signed)
Please advise 

## 2019-11-03 NOTE — Telephone Encounter (Signed)
Caller : Verne Spurr, RN [place] Call Back # [phone]  Guaifenesin  Patient is exacerbating per RN with liquid medication prescribed.   RN wishes to change to a pill form on medication guaifenesin.   Please Advise

## 2019-11-20 ENCOUNTER — Other Ambulatory Visit: Payer: Self-pay | Admitting: Internal Medicine

## 2019-11-22 ENCOUNTER — Telehealth: Payer: Self-pay | Admitting: Cardiovascular Disease

## 2019-11-22 NOTE — Telephone Encounter (Signed)
I attempted to contact patient to schedule follow up visit from patients recall list with Dr. Claiborne Billings. The patient is unable to make appointment because they are under the care of Hospice currently.

## 2019-11-26 ENCOUNTER — Telehealth: Payer: Self-pay

## 2019-11-26 NOTE — Telephone Encounter (Signed)
Hospice orders signed and faxed back to [employer] at [phone]. Form sent for scanning.

## 2019-11-30 DIAGNOSIS — I509 Heart failure, unspecified: Secondary | ICD-10-CM | POA: Diagnosis not present

## 2019-12-02 ENCOUNTER — Telehealth: Payer: Self-pay | Admitting: Cardiovascular Disease

## 2019-12-02 DIAGNOSIS — I509 Heart failure, unspecified: Secondary | ICD-10-CM | POA: Diagnosis not present

## 2019-12-02 NOTE — Telephone Encounter (Signed)
Called patient to schedule appt on 12/02/19, sister stated patient is under care of hospice

## 2019-12-30 DIAGNOSIS — I509 Heart failure, unspecified: Secondary | ICD-10-CM | POA: Diagnosis not present

## 2019-12-31 ENCOUNTER — Other Ambulatory Visit: Payer: Self-pay | Admitting: Internal Medicine

## 2019-12-31 MED ORDER — METOPROLOL TARTRATE 75 MG PO TABS: 75.0000 mg | ORAL_TABLET | Freq: Two times a day (BID) | ORAL | 0 refills | Status: DC

## 2020-01-07 ENCOUNTER — Telehealth: Payer: Self-pay

## 2020-01-07 NOTE — Telephone Encounter (Signed)
Hospice Orders received from [employer]. Orders signed and faxed back to [phone]. Orders sent for scanning.

## 2020-01-14 ENCOUNTER — Telehealth: Payer: Self-pay | Admitting: Internal Medicine

## 2020-01-14 MED ORDER — METOPROLOL TARTRATE 75 MG PO TABS: 75.0000 mg | ORAL_TABLET | Freq: Two times a day (BID) | ORAL | 0 refills | Status: AC

## 2020-01-14 NOTE — Telephone Encounter (Signed)
Patient's daughter stays with the increase of medication, patient has ran out of medication. Per daughter please resend medcation into pharmacy.  Medication:Metoprolol Tartrate 75 MG TABS [[id number]]    Has the patient contacted their pharmacy? No. (If no, request that the patient contact the pharmacy for the refill.) (If yes, when and what did the pharmacy advise?)  Preferred Pharmacy (with phone number or street name): [id number] [address], [place] [place] [address]  Phone:  [phone] Fax:  [phone]  DEA #:  [id number]  Agent: Please be advised that RX refills may take up to 3 business days. We ask that you follow-up with your pharmacy.

## 2020-01-14 NOTE — Telephone Encounter (Signed)
Rxs sent

## 2020-01-24 ENCOUNTER — Telehealth: Payer: Self-pay | Admitting: Internal Medicine

## 2020-01-24 NOTE — Telephone Encounter (Signed)
Please inform her that we request they drop off the death certificate instead of mailing it.

## 2020-01-24 NOTE — Telephone Encounter (Signed)
Caller : Mickel Baas  Call Back # [phone]  Per Mickel Baas , Her office will send death certificate via [place] mail.

## 2020-02-01 NOTE — Telephone Encounter (Signed)
Spoke w/ Claudie Revering service- informed we have not received the death certificate she mailed. I asked that she drop it off. States she is to busy for that but will mail another.

## 2020-02-07 NOTE — Telephone Encounter (Signed)
Rebecca Skinner called again about death certificate. She states she mailed it again but we haven't reved it yet. I did let her know that I would call her once we get it in the mail.

## 2020-02-09 DEATH — deceased

## 2020-02-11 NOTE — Telephone Encounter (Signed)
Death certificate completed by Dr. Etter Sjogren. PCP out of office. Copy of certificate sent for scanning. [facility] aware that it is ready for pick up.

## 2020-02-21 ENCOUNTER — Telehealth: Payer: Self-pay | Admitting: Internal Medicine

## 2020-02-21 NOTE — Telephone Encounter (Signed)
Hassan Rowan with [place] Is faxing interim report for Dr. Larose Kells to sign. States she faxed in back in July and has not received it back.

## 2020-02-22 NOTE — Telephone Encounter (Signed)
Will be on look out for form.

## 2020-02-24 NOTE — Telephone Encounter (Signed)
Never received forms-no number to call back.

## 2020-02-28 ENCOUNTER — Telehealth: Payer: Self-pay | Admitting: Internal Medicine

## 2020-02-28 NOTE — Telephone Encounter (Signed)
I thought this was completed on 02/11/20 and they were informed to pick up? I was over at [facility] that day I believe.

## 2020-02-28 NOTE — Telephone Encounter (Signed)
Noted, thank you

## 2020-02-28 NOTE — Telephone Encounter (Signed)
Fortino Sic form [facility] came by and dropped of DC for [place] to fill out.    I took to [facility] to finish up.  She would like to be called when the form is ready [phone]

## 2020-02-28 NOTE — Telephone Encounter (Signed)
This was completed by Dr. Carollee Herter in Dr. Ethel Rana absence.  Dated and signed on 02/10/20.  [facility] was notified it was ready for pick up on 02/10/20.  It was never picked up and has been up front ever since.  I will call today and let them know it is ready.

## 2020-03-07 ENCOUNTER — Telehealth: Payer: Self-pay | Admitting: Internal Medicine

## 2020-03-07 NOTE — Telephone Encounter (Signed)
Form in red folder for signing when [facility] returns.

## 2020-03-07 NOTE — Telephone Encounter (Signed)
Marla Minoso from [facility] dropped off document needed to be signed by provider. (1 page of Supplement Order) Lemmie Evens (rep from [facility]) wants to be called when document ready to pick up at [phone]. Document put at front office tray under providers name. (Marla informed this was an order from 01-03-2020)

## 2020-03-13 NOTE — Telephone Encounter (Signed)
LMOM informing Rebecca Skinner that form ready for pick up at front desk. Copy sent for scanning.
